# Patient Record
Sex: Female | Born: 1945 | Race: White | Hispanic: No | Smoking: Never smoker
Health system: Southern US, Community
[De-identification: ages and names within clinical notes are randomized; demographics above are authoritative.]

## PROBLEM LIST (undated history)

## (undated) ENCOUNTER — Inpatient Hospital Stay: Admission: EM | Payer: Self-pay | Source: Home / Self Care

## (undated) DIAGNOSIS — E785 Hyperlipidemia, unspecified: Secondary | ICD-10-CM

## (undated) DIAGNOSIS — I2510 Atherosclerotic heart disease of native coronary artery without angina pectoris: Secondary | ICD-10-CM

## (undated) DIAGNOSIS — F419 Anxiety disorder, unspecified: Secondary | ICD-10-CM

## (undated) DIAGNOSIS — M109 Gout, unspecified: Secondary | ICD-10-CM

## (undated) DIAGNOSIS — K648 Other hemorrhoids: Secondary | ICD-10-CM

## (undated) DIAGNOSIS — I219 Acute myocardial infarction, unspecified: Secondary | ICD-10-CM

## (undated) DIAGNOSIS — E039 Hypothyroidism, unspecified: Secondary | ICD-10-CM

## (undated) DIAGNOSIS — E119 Type 2 diabetes mellitus without complications: Secondary | ICD-10-CM

## (undated) DIAGNOSIS — H409 Unspecified glaucoma: Secondary | ICD-10-CM

## (undated) DIAGNOSIS — I480 Paroxysmal atrial fibrillation: Secondary | ICD-10-CM

## (undated) DIAGNOSIS — K635 Polyp of colon: Secondary | ICD-10-CM

## (undated) DIAGNOSIS — B369 Superficial mycosis, unspecified: Secondary | ICD-10-CM

## (undated) DIAGNOSIS — E079 Disorder of thyroid, unspecified: Secondary | ICD-10-CM

## (undated) DIAGNOSIS — H6241 Otitis externa in other diseases classified elsewhere, right ear: Secondary | ICD-10-CM

## (undated) DIAGNOSIS — J45909 Unspecified asthma, uncomplicated: Secondary | ICD-10-CM

## (undated) DIAGNOSIS — M75102 Unspecified rotator cuff tear or rupture of left shoulder, not specified as traumatic: Secondary | ICD-10-CM

## (undated) DIAGNOSIS — J189 Pneumonia, unspecified organism: Secondary | ICD-10-CM

## (undated) DIAGNOSIS — T7840XA Allergy, unspecified, initial encounter: Secondary | ICD-10-CM

## (undated) DIAGNOSIS — M1990 Unspecified osteoarthritis, unspecified site: Secondary | ICD-10-CM

## (undated) DIAGNOSIS — I10 Essential (primary) hypertension: Secondary | ICD-10-CM

## (undated) HISTORY — PX: ANGIOPLASTY: SHX39

## (undated) HISTORY — DX: Hypothyroidism, unspecified: E03.9

## (undated) HISTORY — DX: Allergy, unspecified, initial encounter: T78.40XA

## (undated) HISTORY — DX: Other hemorrhoids: K64.8

## (undated) HISTORY — DX: Paroxysmal atrial fibrillation: I48.0

## (undated) HISTORY — DX: Unspecified asthma, uncomplicated: J45.909

## (undated) HISTORY — DX: Disorder of thyroid, unspecified: E07.9

## (undated) HISTORY — PX: CARDIAC CATHETERIZATION: SHX172

## (undated) HISTORY — DX: Otitis externa in other diseases classified elsewhere, right ear: H62.41

## (undated) HISTORY — DX: Unspecified osteoarthritis, unspecified site: M19.90

## (undated) HISTORY — DX: Acute myocardial infarction, unspecified: I21.9

## (undated) HISTORY — DX: Superficial mycosis, unspecified: B36.9

## (undated) HISTORY — DX: Unspecified rotator cuff tear or rupture of left shoulder, not specified as traumatic: M75.102

## (undated) HISTORY — DX: Polyp of colon: K63.5

## (undated) HISTORY — DX: Unspecified glaucoma: H40.9

---

## 2008-12-03 HISTORY — PX: CATARACT EXTRACTION: SUR2

## 2013-12-03 HISTORY — PX: HEMORRHOID BANDING: SHX5850

## 2015-01-04 HISTORY — PX: COLONOSCOPY W/ POLYPECTOMY: SHX1380

## 2015-01-04 LAB — HM COLONOSCOPY

## 2016-07-30 DIAGNOSIS — I219 Acute myocardial infarction, unspecified: Secondary | ICD-10-CM

## 2016-07-30 HISTORY — DX: Acute myocardial infarction, unspecified: I21.9

## 2016-10-10 ENCOUNTER — Emergency Department
Admission: EM | Admit: 2016-10-10 | Discharge: 2016-10-10 | Disposition: A | Discharge status: Home / Self Care | Payer: PRIVATE HEALTH INSURANCE | Source: Home / Self Care | Attending: Family Medicine | Admitting: Family Medicine

## 2016-10-10 ENCOUNTER — Encounter: Payer: Self-pay | Admitting: *Deleted

## 2016-10-10 DIAGNOSIS — M109 Gout, unspecified: Secondary | ICD-10-CM | POA: Diagnosis not present

## 2016-10-10 HISTORY — DX: Essential (primary) hypertension: I10

## 2016-10-10 HISTORY — DX: Hyperlipidemia, unspecified: E78.5

## 2016-10-10 HISTORY — DX: Type 2 diabetes mellitus without complications: E11.9

## 2016-10-10 HISTORY — DX: Gout, unspecified: M10.9

## 2016-10-10 MED ORDER — COLCHICINE 0.6 MG PO TABS: 0.6000 mg | ORAL_TABLET | Freq: Every day | ORAL | 0 refills | Status: DC

## 2016-10-10 NOTE — ED Triage Notes (Signed)
Pt c/o LT ankle pain and swelling x 2 days. She reports some pain in her great toe. Denies injury. Reports a hx of gout.

## 2016-10-10 NOTE — ED Provider Notes (Signed)
CSN: [id number]     Arrival date & time 10/10/16  1520 History   First MD Initiated Contact with Patient 10/10/16 1558     Chief Complaint  Patient presents with  . Ankle Pain   (Consider location/radiation/quality/duration/timing/severity/associated sxs/prior Treatment) HPI Tara Campbell is a 70 y.o. female presenting to UC with c/o Left lateral ankle pain that started 2 days ago with mild swelling and redness.  She initially thought she had accidentally stepped wrong but then developed great toe pain this night, c/w prior episodes of gout.  She has not had gout in her ankle before but tends to get a gout flare in great toes every 8-9 months. It has been almost 1 year since last flare. She does not recall what she has taken in the past for gout but notes it works within 2-3 days.  She is on Plavix.  She also has DM but has been on prednisone for asthma flares. She does not believe she was on prednisone for the gout. Denies fever or chills. No known injuries to Left ankle or foot.    Past Medical History:  Diagnosis Date  . Diabetes mellitus without complication (HCC)   . Gout   . Hyperlipidemia   . Hypertension    Past Surgical History:  Procedure Laterality Date  . ANGIOPLASTY     Family History  Problem Relation Age of Onset  . Hypertension Mother   . Glaucoma Father   . Heart disease Father   . Hypertension Father    Social History  Substance Use Topics  . Smoking status: Never Smoker  . Smokeless tobacco: Never Used  . Alcohol use Yes   OB History    No data available     Review of Systems  Constitutional: Negative for chills and fever.  Musculoskeletal: Positive for arthralgias, joint swelling and myalgias. Negative for gait problem.       Left ankle  Skin: Positive for color change. Negative for rash and wound.  Neurological: Negative for weakness and numbness.    Allergies  Latex and Sulfa antibiotics  Home Medications   Prior to Admission medications    Medication Sig Start Date End Date Taking? Authorizing Provider  atenolol (TENORMIN) 100 MG tablet Take 100 mg by mouth daily.   Yes Historical Provider, MD  atorvastatin (LIPITOR) 10 MG tablet Take 10 mg by mouth daily.   Yes Historical Provider, MD  clopidogrel (PLAVIX) 75 MG tablet Take 75 mg by mouth daily.   Yes Historical Provider, MD  Fluticasone Furoate-Vilanterol (BREO ELLIPTA IN) Inhale into the lungs.   Yes Historical Provider, MD  irbesartan (AVAPRO) 300 MG tablet Take 300 mg by mouth daily.   Yes Historical Provider, MD  levothyroxine (SYNTHROID, LEVOTHROID) 100 MCG tablet Take 100 mcg by mouth daily before breakfast.   Yes Historical Provider, MD  metFORMIN (GLUCOPHAGE) 500 MG tablet Take by mouth 2 (two) times daily with a meal.   Yes Historical Provider, MD  sitaGLIPtin (JANUVIA) 100 MG tablet Take 100 mg by mouth daily.   Yes Historical Provider, MD  tiotropium (SPIRIVA) 18 MCG inhalation capsule Place 18 mcg into inhaler and inhale daily.   Yes Historical Provider, MD  colchicine 0.6 MG tablet Take 1 tablet (0.6 mg total) by mouth daily. 10/10/16   Junius FinnerErin O'Malley, PA-C   Meds Ordered and Administered this Visit  Medications - No data to display  BP 153/82 (BP Location: Left Arm)   Pulse 116   Temp 98.1 F (  36.7 C) (Oral)   Resp 18   Ht 5' 3.5" (1.613 m)   Wt 202 lb (91.6 kg)   SpO2 96%   BMI 35.22 kg/m  No data found.   Physical Exam  Constitutional: She is oriented to person, place, and time. She appears well-developed and well-nourished.  HENT:  Head: Normocephalic and atraumatic.  Eyes: EOM are normal.  Neck: Normal range of motion.  Cardiovascular: Normal rate.   Pulses:      Dorsalis pedis pulses are 2+ on the left side.       Posterior tibial pulses are 2+ on the left side.  Pulmonary/Chest: Effort normal.  Musculoskeletal: Normal range of motion. She exhibits edema and tenderness.  Left ankle: mild edema to lateral aspect, tender. Full ROM.  Full ROM  toes, no tenderness or edema of great toe. Calf is soft, non-tender.  Neurological: She is alert and oriented to person, place, and time.  Skin: Skin is warm and dry. Capillary refill takes less than 2 seconds. There is erythema.  Left lateral ankle: skin in tact, mild erythema and warmth. Tender. No red streaking.  Psychiatric: She has a normal mood and affect. Her behavior is normal.  Nursing note and vitals reviewed.   Urgent Care Course   Clinical Course     Procedures (including critical care time)  Labs Review Labs Reviewed - No data to display  Imaging Review No results found.   MDM   1. Acute gout of left ankle, unspecified cause    Pt c/o Left lateral ankle pain c/w prior episodes of gout. No known injury. Left ankle: mild edema with erythema and warmth. Calf is soft, non-tender.  Will treat as gout per pt's reported hx. Pt does not recall medication she was on last for gout but based on hx, it was most likely colchicine.  Rx: colchicine 0.6mg  once daily for up to 6 days. Encouraged f/u with PCP in 4-5 days if not improving, sooner if worsening.     Junius Finner, PA-C 10/10/16 [phone]

## 2017-01-06 DIAGNOSIS — J4551 Severe persistent asthma with (acute) exacerbation: Secondary | ICD-10-CM | POA: Insufficient documentation

## 2017-01-11 ENCOUNTER — Ambulatory Visit: Payer: PRIVATE HEALTH INSURANCE | Admitting: Physician Assistant

## 2017-01-14 ENCOUNTER — Ambulatory Visit (INDEPENDENT_AMBULATORY_CARE_PROVIDER_SITE_OTHER): Payer: PRIVATE HEALTH INSURANCE | Admitting: Physician Assistant

## 2017-01-14 ENCOUNTER — Encounter: Payer: Self-pay | Admitting: Physician Assistant

## 2017-01-14 ENCOUNTER — Ambulatory Visit (INDEPENDENT_AMBULATORY_CARE_PROVIDER_SITE_OTHER): Payer: PRIVATE HEALTH INSURANCE

## 2017-01-14 VITALS — BP 113/66 | HR 71 | Temp 98.0°F | Wt 193.0 lb

## 2017-01-14 DIAGNOSIS — R05 Cough: Secondary | ICD-10-CM

## 2017-01-14 DIAGNOSIS — J189 Pneumonia, unspecified organism: Secondary | ICD-10-CM

## 2017-01-14 MED ORDER — PREDNISONE 10 MG (48) PO TBPK: ORAL_TABLET | Freq: Every day | ORAL | 0 refills | Status: DC

## 2017-01-14 MED ORDER — MOXIFLOXACIN HCL 400 MG PO TABS
400.0000 mg | ORAL_TABLET | Freq: Every day | ORAL | 0 refills | Status: DC
Start: 2017-01-14 — End: 2017-01-23

## 2017-01-14 NOTE — Patient Instructions (Addendum)
Follow-up in 1 week  Healthcare-Associated Pneumonia Healthcare-associated pneumonia is a lung infection that a person can get when in a health care setting or during certain procedures. The infection causes air sacs inside the lungs to fill with pus or fluid. Healthcare-associated pneumonia is usually caused by bacteria that are common in health care settings. These bacteria may be resistant to some antibiotic medicines. What are the causes? This condition is caused by bacteria that get into your lungs. You can get this condition if you:  Breathe in droplets from an infected person's cough or sneeze.  Touch something that an infected person coughed or sneezed on and then touch your mouth, nose, or eyes.  Have a bacterial infection somewhere else in your body, if the bacteria spread to your lungs through your blood. What increases the risk? This condition is more likely to develop in people who:  Have a disease that weakens their body's defense system (immune system) or their ability to cough out germs.  Are older than age 73.  Having trouble swallowing.  Use a feeding or breathing tube.  Have a cold or the flu.  Have an IV tube inserted in a vein.  Have surgery.  Have a bed sore.  Live in a long-term care facility, such as a nursing home.  Were in the hospital for two or more days in the past 3 months.  Received hemodialysis in the past 30 days. What are the signs or symptoms? Symptoms of this condition include:  Fever.  Chills.  Cough.  Shortness of breath.  Wheezing or crackling sounds when breathing. How is this diagnosed? This condition may be diagnosed based on:  Your symptoms.  A chest X-ray.  A measurement of the amount of oxygen in your blood. How is this treated? This condition is treated with antibiotics. Your health care provider may take a sample of cells (culture) from your throat to determine what type of bacteria is in your lungs and change  your antibiotic based on the results. If you have bacteria in your blood, trouble breathing, or a low oxygen level, you may need to be treated at the hospital. At the hospital, you will be given antibiotics through an IV tube. You may also be given oxygen or breathing treatments. Follow these instructions at home: Medicine  Take your antibiotic medicine as told by your health care provider. Do not stop taking the antibiotic even if you start to feel better.  Take over-the-counter and other prescription medicines only as told by your health care provider. Activity  Rest at home until you feel better.  Return to your normal activities as told by your health care provider. Ask your health care provider what activities are safe for you. General instructions  Drink enough fluid to keep your urine clear or pale yellow.  Do not use any products that contain nicotine or tobacco, such as cigarettes and e-cigarettes. If you need help quitting, ask your health care provider.  Limit alcohol intake to no more than 1 drink per day for nonpregnant women and 2 drinks per day for men. One drink equals 12 oz of beer, 5 oz of wine, or 1 oz of hard liquor.  Keep all follow-up visits as told by your health care provider. This is important. How is this prevented? Actions that I can take To lower your risk of getting this condition again:  Do not smoke. This includes e-cigarettes.  Do not drink too much alcohol.  Keep your immune system  healthy by eating well and getting enough sleep.  Get a flu shot every year (annually).  Get a pneumonia vaccination if:  You are older than age 73.  You smoke.  You have a long-lasting condition like lung disease.  Exercise your lungs by taking deep breaths, walking, and using an incentive spirometer as directed.  Wash your hands often with soap and water. If you cannot get to a sink to wash your hands, use an alcohol-based hand cleaner.  Make sure your health  care providers are washing their hands. If you do not see them wash their hands, ask them to do so.  When you are in a health care facility, avoid touching your eyes, nose, and mouth.  Avoid touching any surface near where people have coughed or sneezed.  Stand away from sick people when they are coughing or sneezing.  Wear a mask if you cannot avoid exposure to people who are sick.  Clean all surfaces often with a disinfectant cleaner, especially if someone is sick at home or work. Precautions of my health care team Hospitals, nursing homes, and other health care facilities take special care to try to prevent healthcare-associated pneumonia. To do this, your health care team may:  Clean their hands with soap and water or with alcohol-based hand sanitizer before and after seeing patients.  Wear gloves or masks during treatment.  Sanitize medical instruments, tubes, other equipment, and surfaces in patient rooms.  Raise (elevate) the head of your hospital bed so you are not lying flat. The head of the bed may be elevated 30 degrees or more.  Have you sit up and move around as soon as possible after surgery.  Only insert a breathing tube if needed.  Do these things for you if you have a breathing tube:  Clean the inside of your mouth regularly.  Remove the breathing tube as soon as it is no longer needed. Contact a health care provider if:  Your symptoms do not get better or they get worse.  Your symptoms come back after you have finished taking your antibiotics. Get help right away if:  You have trouble breathing.  You have confusion or difficulty thinking. This information is not intended to replace advice given to you by your health care provider. Make sure you discuss any questions you have with your health care provider. Document Released: 04/10/2016 Document Revised: 09/04/2016 Document Reviewed: 08/17/2016 Elsevier Interactive Patient Education  2017 [employer].

## 2017-01-14 NOTE — Progress Notes (Signed)
HPI:                                                                Tara Campbell is a 71 y.o. female who presents to [name]: Primary Care Sports Medicine today to establish care and hospital follow-up  Asthma: patient was admitted to [facility] ICU for acute asthma exacerbation 2/2 to influenza on 01/06/17. She also had new onset atrial fibrillation at that time. Treated with Tamiflu and discharged on home Breo and Spiriva. She continues to feel malaise and cough is more productive. Denies fevers, chills, myalgias. Denies dyspnea. Endorses 1 episode of diarrhea today.  HTN/Hx of MI: controlled on Atenolol. She underwent PCI in 07/2016 and had 2 stents. She is currently on daily Plavix. No outside pressures to report. Denies headache, chest pain, dyspnea, lightheadedness, and edema.   Health Maintenance Health Maintenance  Topic Date Due  . HEMOGLOBIN A1C  1946/09/03  . Hepatitis C Screening  [birth year]  . OPHTHALMOLOGY EXAM  04/18/1954  . TETANUS/TDAP  04/19/1963  . ZOSTAVAX  04/18/2004  . DEXA SCAN  04/18/2009  . PNA vac Low Risk Adult (1 of 2 - PCV13) 04/18/2009  . MAMMOGRAM  11/02/2017  . FOOT EXAM  01/14/2018  . COLONOSCOPY  01/31/2025  . INFLUENZA VACCINE  Completed    GYN/Sexual Health  Menstrual status: postmenopausal  Last pap smear: 2015  History of abnormal pap smears: no  Sexually active: no  Current contraception: n/a  Health Habits  Diet: fair, 5-6 cups of tea per day  Exercise: no  ETOH: occasional  Tobacco: no, never  Drugs: no   Past Medical History:  Diagnosis Date  . A-fib (HCC)   . Allergy   . Asthma   . Diabetes mellitus without complication (HCC)   . Gout   . Heart attack 07/30/2016   PCI, 2 stents  . Hyperlipidemia   . Hypertension   . Left rotator cuff tear   . Thyroid disease    Past Surgical History:  Procedure Laterality Date  . ANGIOPLASTY     Social History  Substance Use Topics  . Smoking status:  Never Smoker  . Smokeless tobacco: Never Used  . Alcohol use Yes   family history includes Diabetes in her father and paternal grandmother; Glaucoma in her father; Heart disease in her father; Hyperlipidemia in her father and mother; Hypertension in her father and mother; Skin cancer in her father.  ROS: negative except as noted in the HPI  Medications: Current Outpatient Prescriptions  Medication Sig Dispense Refill  . atenolol (TENORMIN) 100 MG tablet Take 100 mg by mouth daily.    Marland Kitchen atorvastatin (LIPITOR) 10 MG tablet Take 10 mg by mouth daily.    . clopidogrel (PLAVIX) 75 MG tablet Take 75 mg by mouth daily.    . colchicine 0.6 MG tablet Take 1 tablet (0.6 mg total) by mouth daily. 6 tablet 0  . Fluticasone Furoate-Vilanterol (BREO ELLIPTA IN) Inhale into the lungs.    . irbesartan (AVAPRO) 300 MG tablet Take 300 mg by mouth daily.    Marland Kitchen levothyroxine (SYNTHROID, LEVOTHROID) 100 MCG tablet Take 100 mcg by mouth daily before breakfast.    . metFORMIN (GLUCOPHAGE) 500 MG tablet Take by mouth 2 (two) times  daily with a meal.    . sitaGLIPtin (JANUVIA) 100 MG tablet Take 100 mg by mouth daily.    Marland Kitchen tiotropium (SPIRIVA) 18 MCG inhalation capsule Place 18 mcg into inhaler and inhale daily.    Marland Kitchen moxifloxacin (AVELOX) 400 MG tablet Take 1 tablet (400 mg total) by mouth daily. 5 tablet 0  . predniSONE (STERAPRED UNI-PAK 48 TAB) 10 MG (48) TBPK tablet Take by mouth daily. Use as directed for taper 1 tablet 0   No current facility-administered medications for this visit.    Allergies  Allergen Reactions  . Sulfamethoxazole Rash    HIVES  . Latex   . Sulfa Antibiotics     Objective:  BP 113/66   Pulse 71   Temp 98 F (36.7 C) (Oral)   Wt 193 lb (87.5 kg)   SpO2 97%   BMI 33.65 kg/m  Gen: well-groomed, cooperative, not ill-appearing, no distress HEENT: normal conjunctiva, TM's clear, oropharynx clear, moist mucus membranes Pulm: Normal work of breathing, voice is hoarse, clear to  auscultation bilaterally, occasional dry cough CV: Normal rate, regular rhythm, s1 and s2 distinct, no murmurs, clicks or rubs appreciated on this exam, no carotid bruit Neuro: alert and oriented x 3, EOM's intact, PERRLA, DTR's intact MSK: strength 5/5 and symmetric, normal gait, distal pulses intact, no peripheral edema Skin: warm and dry, no rashes or lesions on exposed skin Psych: normal affect, pleasant mood, normal speech and thought content   I personally reviewed H&P/discharge summary and labs from 01/05/17  Assessment and Plan: 71 y.o. female with   HCAP (healthcare-associated pneumonia) - afebrile, saturating 97% on room air, but given recent ICU hospitalization will cover for HCAP with Avelox - Prednisone taper - DG Chest 2 View pending - close follow-up in 1 week - predniSONE (STERAPRED UNI-PAK 48 TAB) 10 MG (48) TBPK tablet; Take by mouth daily. Use as directed for taper  Dispense: 1 tablet; Refill: 0 - moxifloxacin (AVELOX) 400 MG tablet; Take 1 tablet (400 mg total) by mouth daily.  Dispense: 5 tablet; Refill: 0  Patient education and anticipatory guidance given Patient agrees with treatment plan Follow-up in 1 week or sooner as needed  Levonne Hubert PA-C

## 2017-01-16 ENCOUNTER — Telehealth: Payer: Self-pay | Admitting: Physician Assistant

## 2017-01-16 DIAGNOSIS — I480 Paroxysmal atrial fibrillation: Secondary | ICD-10-CM | POA: Insufficient documentation

## 2017-01-16 DIAGNOSIS — J4550 Severe persistent asthma, uncomplicated: Secondary | ICD-10-CM | POA: Insufficient documentation

## 2017-01-16 DIAGNOSIS — J4541 Moderate persistent asthma with (acute) exacerbation: Secondary | ICD-10-CM

## 2017-01-16 DIAGNOSIS — I517 Cardiomegaly: Secondary | ICD-10-CM | POA: Insufficient documentation

## 2017-01-16 DIAGNOSIS — J4540 Moderate persistent asthma, uncomplicated: Secondary | ICD-10-CM | POA: Insufficient documentation

## 2017-01-16 NOTE — Telephone Encounter (Signed)
Patient with recent hospitalization for acute asthma exacerbation 2/2 influenza and new-onset paroxysmal A-fib found to have enlarged cardiac silhouette on chest x-ray. Ordering follow-up echocardiogram

## 2017-01-16 NOTE — Telephone Encounter (Signed)
-----   Message from Monica Becton, MD sent at 01/15/2017  5:56 PM EST ----- This probably cardiomyopathy of some type, it's reasonable to go ahead and get an echocardiogram.

## 2017-01-17 ENCOUNTER — Telehealth: Payer: Self-pay | Admitting: Physician Assistant

## 2017-01-17 NOTE — Telephone Encounter (Signed)
FYI- upon review of Pt's insurance benefit information, it was determined her Tara Campbell is for [place] only. They will only cover inpatient or emergency visits. Called and spoke with Pt, she will get an echo done when she goes back to [place]. She will fax the results to PCP here. Cancelling current echo order.

## 2017-01-21 ENCOUNTER — Ambulatory Visit: Payer: PRIVATE HEALTH INSURANCE | Admitting: Physician Assistant

## 2017-01-23 ENCOUNTER — Ambulatory Visit (INDEPENDENT_AMBULATORY_CARE_PROVIDER_SITE_OTHER): Payer: PRIVATE HEALTH INSURANCE | Admitting: Physician Assistant

## 2017-01-23 VITALS — BP 108/58 | HR 81 | Wt 195.0 lb

## 2017-01-23 DIAGNOSIS — E782 Mixed hyperlipidemia: Secondary | ICD-10-CM

## 2017-01-23 DIAGNOSIS — I480 Paroxysmal atrial fibrillation: Secondary | ICD-10-CM | POA: Diagnosis not present

## 2017-01-23 DIAGNOSIS — E119 Type 2 diabetes mellitus without complications: Secondary | ICD-10-CM

## 2017-01-23 DIAGNOSIS — E1169 Type 2 diabetes mellitus with other specified complication: Secondary | ICD-10-CM | POA: Insufficient documentation

## 2017-01-23 DIAGNOSIS — E039 Hypothyroidism, unspecified: Secondary | ICD-10-CM | POA: Insufficient documentation

## 2017-01-23 DIAGNOSIS — I252 Old myocardial infarction: Secondary | ICD-10-CM | POA: Diagnosis not present

## 2017-01-23 MED ORDER — APIXABAN 5 MG PO TABS: 5.0000 mg | ORAL_TABLET | Freq: Two times a day (BID) | ORAL | 2 refills | Status: DC

## 2017-01-23 NOTE — Progress Notes (Signed)
HPI:                                                                Tara Campbell is a 71 y.o. female who presents to [name]: Primary Care Sports Medicine today for follow-up HCAP  Patient is currently in the process of relocating from [place] to [place].  Patient was admitted to [facility] ICU for acute asthma exacerbation 2/2 to influenza on 01/06/17. I saw her on 01/14/17 and she was continuing to experience malaise and productive cough. She completed HCAP treatment with Avelox and is currently on Prednisone taper. Patient reports she is feeling much improved. Denies fever, chills, malaise. Denies cough, dyspnea, or hemoptysis.  Patient had new onset paroxysmal atrial fibrillation on admission approximately 3 weeks ago. She was not discharged on any anticoagulation. She denies chest pain, palpitations, orthopnea, PND, or peripheral edema. She is currently on atenolol and plavix for recent AMI s/p PCI and stent x 2 in 07/2016. She reports she has been followed by a cardiologist and that she had a negative nuclear medicine stress test.   DM II - last A1C 6.7 per patient 11/2016. She reports she goes to the eye doctor every 6 months. Denies polyuria, vision problems, lightheadness, and paresthesias. Denies hypoglycemic events. Denies open wounds/sores.   Past Medical History:  Diagnosis Date  . A-fib (HCC)   . Allergy   . Asthma   . Diabetes mellitus without complication (HCC)   . Gout   . Heart attack 07/30/2016   PCI, 2 stents  . Hyperlipidemia   . Hypertension   . Left rotator cuff tear   . Thyroid disease    Past Surgical History:  Procedure Laterality Date  . ANGIOPLASTY     Social History  Substance Use Topics  . Smoking status: Never Smoker  . Smokeless tobacco: Never Used  . Alcohol use Yes   family history includes Diabetes in her father and paternal grandmother; Glaucoma in her father; Heart disease in her father; Hyperlipidemia in her father  and mother; Hypertension in her father and mother; Skin cancer in her father.  ROS: negative except as noted in the HPI  Medications: Current Outpatient Prescriptions  Medication Sig Dispense Refill  . atenolol (TENORMIN) 100 MG tablet Take 100 mg by mouth daily.    Marland Kitchen atorvastatin (LIPITOR) 10 MG tablet Take 10 mg by mouth daily.    . clopidogrel (PLAVIX) 75 MG tablet Take 75 mg by mouth daily.    . colchicine 0.6 MG tablet Take 1 tablet (0.6 mg total) by mouth daily. 6 tablet 0  . Fluticasone Furoate-Vilanterol (BREO ELLIPTA IN) Inhale into the lungs.    . irbesartan (AVAPRO) 300 MG tablet Take 300 mg by mouth daily.    Marland Kitchen levothyroxine (SYNTHROID, LEVOTHROID) 100 MCG tablet Take 100 mcg by mouth daily before breakfast.    . metFORMIN (GLUCOPHAGE) 500 MG tablet Take by mouth 2 (two) times daily with a meal.    . moxifloxacin (AVELOX) 400 MG tablet Take 1 tablet (400 mg total) by mouth daily. 5 tablet 0  . predniSONE (STERAPRED UNI-PAK 48 TAB) 10 MG (48) TBPK tablet Take by mouth daily. Use as directed for taper 1 tablet 0  . sitaGLIPtin (JANUVIA) 100 MG tablet  Take 100 mg by mouth daily.    Marland Kitchen tiotropium (SPIRIVA) 18 MCG inhalation capsule Place 18 mcg into inhaler and inhale daily.     No current facility-administered medications for this visit.    Allergies  Allergen Reactions  . Sulfamethoxazole Rash    HIVES  . Latex   . Sulfa Antibiotics        Objective:  BP (!) 108/58   Pulse 81   Wt 195 lb (88.5 kg)   BMI 34.00 kg/m  Gen: well-groomed, obese, cooperative, not ill-appearing, no distress Pulm: Normal work of breathing, normal phonation, clear to auscultation bilaterally, no wheezes, rales or rhonchi CV: Normal rate, irregular rhythm, s1 and s2 distinct, no murmurs, clicks or rubs, no peripheral edema Neuro: alert and oriented x 3, EOM's intact Skin: warm and dry, no rashes or lesions on exposed skin, no cyanosis  RECENT LABS 01/05/17 BNP 76.0  TROPONINI <0.006    CMP: 01/05/17 01/06/17 NA 138 135  K 3.6 4.5  CL 104 103  CO2 20* 23  BUN 13 16  CREATININE 0.93 0.89  GLU 283* 320*  CALCIUM 9.5 9.7  MG 1.5* 2.2  PHOS 2.8 3.1   CBC: 01/05/17 01/06/17 WBC 8.8 8.8  HGB 12.8 11.1*  HCT 38.9 32.8*  PLT 260 312   DIFF: 01/05/17 NEUTOPHILPCT 67  LYMPHOPCT 16  MONOPCT 11  BASOPCT 1  EOSPCT 6  NEUTROABS 5.9  LYMPHSABS 1.4  MONOSABS 0.9  BASOSABS 0.1  EOSABS 0.5   ECG performed today 01/23/2017 Ventricular rate 77 PR-I 172 QRS 78 Qtc 414 NSR with PAC's, low voltage QRS, prior septal infarct  Assessment and Plan: 71 y.o. female with  Paroxysmal Atrial fibrillation - new diagnosis. CHADS2 score 4, anticoagulation recommended. Currently on Plavix s/p AMI and stenting.  - ECHO ordered, but due to insurance patient is postponing and will obtain one in [place] next month - ECG today showing NSR and PACs - Referral to Cardiology - Dr. Olga Millers: I've sent a message through MyChart to inquire about starting patient on anticoagulation. He recommended she cont Plavix and start Apixaban 5mg  BID - I reviewed patient's labs from recent hospitalization. Kidney function was normal. Hemoglobin/Hct normal.  Preventive Care - she is due for Mammogram, DEXA, TdaP. She would like to defer until she gets [place] health insurance in a couple of months  Patient education and anticipatory guidance given Patient agrees with treatment plan Follow-up in 3 months or sooner as needed  Levonne Hubert PA-C

## 2017-01-23 NOTE — Patient Instructions (Addendum)
I have sent a message to our cardiologist downstairs about anticoagulation for your Afib. I will give you a call with his recommendation.  Get your echocardiogram  Once you have your insurance, return for your lab work, Tetanus, bone density and diabetes follow-up :)   Preventive Care 73 Years and Older, Female Preventive care refers to lifestyle choices and visits with your health care provider that can promote health and wellness. What does preventive care include?  A yearly physical exam. This is also called an annual well check.  Dental exams once or twice a year.  Routine eye exams. Ask your health care provider how often you should have your eyes checked.  Personal lifestyle choices, including:  Daily care of your teeth and gums.  Regular physical activity.  Eating a healthy diet.  Avoiding tobacco and drug use.  Limiting alcohol use.  Practicing safe sex.  Taking low-dose aspirin every day.  Taking vitamin and mineral supplements as recommended by your health care provider. What happens during an annual well check? The services and screenings done by your health care provider during your annual well check will depend on your age, overall health, lifestyle risk factors, and family history of disease. Counseling  Your health care provider may ask you questions about your:  Alcohol use.  Tobacco use.  Drug use.  Emotional well-being.  Home and relationship well-being.  Sexual activity.  Eating habits.  History of falls.  Memory and ability to understand (cognition).  Work and work Statistician.  Reproductive health. Screening  You may have the following tests or measurements:  Height, weight, and BMI.  Blood pressure.  Lipid and cholesterol levels. These may be checked every 5 years, or more frequently if you are over 24 years old.  Skin check.  Lung cancer screening. You may have this screening every year starting at age 36 if you have a  30-pack-year history of smoking and currently smoke or have quit within the past 15 years.  Fecal occult blood test (FOBT) of the stool. You may have this test every year starting at age 44.  Flexible sigmoidoscopy or colonoscopy. You may have a sigmoidoscopy every 5 years or a colonoscopy every 10 years starting at age 18.  Hepatitis C blood test.  Hepatitis B blood test.  Sexually transmitted disease (STD) testing.  Diabetes screening. This is done by checking your blood sugar (glucose) after you have not eaten for a while (fasting). You may have this done every 1-3 years.  Bone density scan. This is done to screen for osteoporosis. You may have this done starting at age 66.  Mammogram. This may be done every 1-2 years. Talk to your health care provider about how often you should have regular mammograms. Talk with your health care provider about your test results, treatment options, and if necessary, the need for more tests. Vaccines  Your health care provider may recommend certain vaccines, such as:  Influenza vaccine. This is recommended every year.  Tetanus, diphtheria, and acellular pertussis (Tdap, Td) vaccine. You may need a Td booster every 10 years.  Varicella vaccine. You may need this if you have not been vaccinated.  Zoster vaccine. You may need this after age 26.  Measles, mumps, and rubella (MMR) vaccine. You may need at least one dose of MMR if you were born in 1957 or later. You may also need a second dose.  Pneumococcal 13-valent conjugate (PCV13) vaccine. One dose is recommended after age 41.  Pneumococcal polysaccharide (PPSV23)  vaccine. One dose is recommended after age 15.  Meningococcal vaccine. You may need this if you have certain conditions.  Hepatitis A vaccine. You may need this if you have certain conditions or if you travel or work in places where you may be exposed to hepatitis A.  Hepatitis B vaccine. You may need this if you have certain  conditions or if you travel or work in places where you may be exposed to hepatitis B.  Haemophilus influenzae type b (Hib) vaccine. You may need this if you have certain conditions. Talk to your health care provider about which screenings and vaccines you need and how often you need them. This information is not intended to replace advice given to you by your health care provider. Make sure you discuss any questions you have with your health care provider. Document Released: 12/16/2015 Document Revised: 08/08/2016 Document Reviewed: 09/20/2015 Elsevier Interactive Patient Education  2017 [employer].

## 2017-01-24 ENCOUNTER — Encounter: Payer: Self-pay | Admitting: Physician Assistant

## 2017-01-25 ENCOUNTER — Telehealth: Payer: Self-pay

## 2017-01-25 NOTE — Telephone Encounter (Signed)
-----   Message from [facility], [place] sent at 01/24/2017  8:46 AM EST ----- Tell Tara Campbell that Dr. Jens Som said to continue her plavix and start Apixaban twice a day for her Afib. Since this is an anticoagulant, it can cause bleeding. She needs to let us know if she has any falls, hits her head, or notices blood in her stool or has dark tarry stool. Also Dr. Jens Som says he looks forward to meeting her.

## 2017-01-25 NOTE — Telephone Encounter (Signed)
Pt.notified

## 2017-03-05 ENCOUNTER — Ambulatory Visit: Payer: PRIVATE HEALTH INSURANCE | Admitting: Physician Assistant

## 2017-04-08 ENCOUNTER — Ambulatory Visit (INDEPENDENT_AMBULATORY_CARE_PROVIDER_SITE_OTHER): Payer: Medicare Other | Admitting: Physician Assistant

## 2017-04-08 ENCOUNTER — Ambulatory Visit (INDEPENDENT_AMBULATORY_CARE_PROVIDER_SITE_OTHER): Payer: Medicare Other

## 2017-04-08 VITALS — BP 150/80 | HR 83

## 2017-04-08 DIAGNOSIS — M25472 Effusion, left ankle: Secondary | ICD-10-CM

## 2017-04-08 DIAGNOSIS — M25572 Pain in left ankle and joints of left foot: Secondary | ICD-10-CM

## 2017-04-08 DIAGNOSIS — M109 Gout, unspecified: Secondary | ICD-10-CM | POA: Insufficient documentation

## 2017-04-08 MED ORDER — COLCHICINE 0.6 MG PO TABS: 0.6000 mg | ORAL_TABLET | Freq: Every day | ORAL | 0 refills | Status: DC

## 2017-04-08 NOTE — Patient Instructions (Addendum)
Follow-up within 4 weeks for your preventive care including diabetes and to discuss any refills  Low-Purine Diet Purines are compounds that affect the level of uric acid in your body. A low-purine diet is a diet that is low in purines. Eating a low-purine diet can prevent the level of uric acid in your body from getting too high and causing gout or kidney stones or both. What do I need to know about this diet?  Choose low-purine foods. Examples of low-purine foods are listed in the next section.  Drink plenty of fluids, especially water. Fluids can help remove uric acid from your body. Try to drink 8-16 cups (1.9-3.8 L) a day.  Limit foods high in fat, especially saturated fat, as fat makes it harder for the body to get rid of uric acid. Foods high in saturated fat include pizza, cheese, ice cream, whole milk, fried foods, and gravies. Choose foods that are lower in fat and lean sources of protein. Use olive oil when cooking as it contains healthy fats that are not high in saturated fat.  Limit alcohol. Alcohol interferes with the elimination of uric acid from your body. If you are having a gout attack, avoid all alcohol.  Keep in mind that different people's bodies react differently to different foods. You will probably learn over time which foods do or do not affect you. If you discover that a food tends to cause your gout to flare up, avoid eating that food. You can more freely enjoy foods that do not cause problems. If you have any questions about a food item, talk to your dietitian or health care provider. Which foods are low, moderate, and high in purines? The following is a list of foods that are low, moderate, and high in purines. You can eat any amount of the foods that are low in purines. You may be able to have small amounts of foods that are moderate in purines. Ask your health care provider how much of a food moderate in purines you can have. Avoid foods high in purines. Grains    Foods low in purines: Enriched white bread, pasta, rice, cake, cornbread, popcorn.  Foods moderate in purines: Whole-grain breads and cereals, wheat germ, bran, oatmeal. Uncooked oatmeal. Dry wheat bran or wheat germ.  Foods high in purines: Pancakes, [place] toast, biscuits, muffins. Vegetables   Foods low in purines: All vegetables, except those that are moderate in purines.  Foods moderate in purines: Asparagus, cauliflower, spinach, mushrooms, green peas. Fruits   All fruits are low in purines. Meats and other Protein Foods   Foods low in purines: Eggs, nuts, peanut butter.  Foods moderate in purines: 80-90% lean beef, lamb, veal, pork, poultry, fish, eggs, peanut butter, nuts. Crab, lobster, oysters, and shrimp. Cooked dried beans, peas, and lentils.  Foods high in purines: Anchovies, sardines, herring, mussels, tuna, codfish, scallops, trout, and haddock. Tomasa Blase. Organ meats (such as liver or kidney). Tripe. Game meat. Goose. Sweetbreads. Dairy   All dairy foods are low in purines. Low-fat and fat-free dairy products are best because they are low in saturated fat. Beverages   Drinks low in purines: Water, carbonated beverages, tea, coffee, cocoa.  Drinks moderate in purines: Soft drinks and other drinks sweetened with high-fructose corn syrup. Juices. To find whether a food or drink is sweetened with high-fructose corn syrup, look at the ingredients list.  Drinks high in purines: Alcoholic beverages (such as beer). Condiments   Foods low in purines: Salt, herbs, olives, pickles,  relishes, vinegar.  Foods moderate in purines: Butter, margarine, oils, mayonnaise. Fats and Oils   Foods low in purines: All types, except gravies and sauces made with meat.  Foods high in purines: Gravies and sauces made with meat. Other Foods   Foods low in purines: Sugars, sweets, gelatin. Cake. Soups made without meat.  Foods moderate in purines: Meat-based or fish-based soups, broths,  or bouillons. Foods and drinks sweetened with high-fructose corn syrup.  Foods high in purines: High-fat desserts (such as ice cream, cookies, cakes, pies, doughnuts, and chocolate). Contact your dietitian for more information on foods that are not listed here.  This information is not intended to replace advice given to you by your health care provider. Make sure you discuss any questions you have with your health care provider. Document Released: 03/16/2011 Document Revised: 04/26/2016 Document Reviewed: 10/26/2013 Elsevier Interactive Patient Education  2017 [employer].

## 2017-04-08 NOTE — Assessment & Plan Note (Signed)
Aspiration and injection of the subtalar joint as above, x-rays, crystal analysis. Return in one month, at that point we will consider getting uric acid levels.

## 2017-04-08 NOTE — Progress Notes (Signed)
   Subjective:    I'm seeing this patient as a consultation for:  [name], PA-C  CC: Left ankle swelling  HPI: This is a pleasant 71 year old female with a history of gout, she comes in with a several-day history of increasing swelling and pain over the left ankle and foot. She is not taking any medications for gout right now. Pain is severe, persistent.  Past medical history:  Negative.  See flowsheet/record as well for more information.  Surgical history: Negative.  See flowsheet/record as well for more information.  Family history: Negative.  See flowsheet/record as well for more information.  Social history: Negative.  See flowsheet/record as well for more information.  Allergies, and medications have been entered into the medical record, reviewed, and no changes needed.   Review of Systems: No headache, visual changes, nausea, vomiting, diarrhea, constipation, dizziness, abdominal pain, skin rash, fevers, chills, night sweats, weight loss, swollen lymph nodes, body aches, joint swelling, muscle aches, chest pain, shortness of breath, mood changes, visual or auditory hallucinations.   Objective:   General: Well Developed, well nourished, and in no acute distress.  Neuro/Psych: Alert and oriented x3, extra-ocular muscles intact, able to move all 4 extremities, sensation grossly intact. Skin: Warm and dry, no rashes noted.  Respiratory: Not using accessory muscles, speaking in full sentences, trachea midline.  Cardiovascular: Pulses palpable, no extremity edema. Abdomen: Does not appear distended. Left Ankle: Visibly swollen, fluid wave anterior. Range of motion is full in all directions. Strength is 5/5 in all directions. Stable lateral and medial ligaments; squeeze test and kleiger test unremarkable; Talar dome nontender; No pain at base of 5th MT; No tenderness over cuboid; No tenderness over N spot or navicular prominence No tenderness on posterior aspects of lateral  and medial malleolus No sign of peroneal tendon subluxations; Negative tarsal tunnel tinel's Able to walk 4 steps.  Procedure: Real-time Ultrasound Guided aspiration/Injection of left subtalar joint effusion Device: GE Logiq E  Verbal informed consent obtained.  Time-out conducted.  Noted no overlying erythema, induration, or other signs of local infection.  Skin prepped in a sterile fashion.  Local anesthesia: Topical Ethyl chloride.  With sterile technique and under real time ultrasound guidance:  Using a 22-gauge needle advanced into the subtalar joints, I drained about 1 mL of cloudy yellowish fluid in the syringe was switched, I then injected 1 mL kenalog 40, 1 mL lidocaine, 1 mL bupivicaine. Completed without difficulty  Pain immediately resolved suggesting accurate placement of the medication.  Advised to call if fevers/chills, erythema, induration, drainage, or persistent bleeding.  Images permanently stored and available for review in the ultrasound unit.  Impression: Technically successful ultrasound guided injection.  Impression and Recommendations:   This case required medical decision making of moderate complexity.  Left ankle swelling Aspiration and injection of the subtalar joint as above, x-rays, crystal analysis. Return in one month, at that point we will consider getting uric acid levels.

## 2017-04-08 NOTE — Progress Notes (Signed)
HPI:                                                                Tara Campbell is a 71 y.o. female who presents to [name] today for ankle/foot pain  Patient with PMH of gout, DM, CAD, HTN, and Afib presents with left ankle pain. She reports beginning on Friday night she developed medial ankle sensitivity while under the bed sheets. Since then, she developed swelling and pain that she attributes to a gout flair. She is unable to bear weight on her left foot today. She denies fever or chills.    Past Medical History:  Diagnosis Date  . Allergy   . Asthma   . Diabetes mellitus without complication (HCC)   . Gout   . Heart attack 07/30/2016   PCI, 2 stents  . Hyperlipidemia   . Hypertension   . Left rotator cuff tear   . Paroxysmal atrial fibrillation (HCC)   . Thyroid disease    Past Surgical History:  Procedure Laterality Date  . ANGIOPLASTY     Social History  Substance Use Topics  . Smoking status: Never Smoker  . Smokeless tobacco: Never Used  . Alcohol use Yes   family history includes Diabetes in her father and paternal grandmother; Glaucoma in her father; Heart disease in her father; Hyperlipidemia in her father and mother; Hypertension in her father and mother; Skin cancer in her father.  ROS: negative except as noted in the HPI  Medications: Current Outpatient Prescriptions  Medication Sig Dispense Refill  . apixaban (ELIQUIS) 5 MG TABS tablet Take 1 tablet (5 mg total) by mouth 2 (two) times daily. 60 tablet 2  . atenolol (TENORMIN) 100 MG tablet Take 100 mg by mouth daily.    Marland Kitchen atorvastatin (LIPITOR) 10 MG tablet Take 40 mg by mouth daily.    . clopidogrel (PLAVIX) 75 MG tablet Take 75 mg by mouth daily.    . colchicine 0.6 MG tablet Take 1 tablet (0.6 mg total) by mouth daily. 6 tablet 0  . Fluticasone Furoate-Vilanterol (BREO ELLIPTA IN) Inhale into the lungs.    . irbesartan (AVAPRO) 300 MG tablet  Take 300 mg by mouth daily.    Marland Kitchen levothyroxine (SYNTHROID, LEVOTHROID) 100 MCG tablet Take 100 mcg by mouth daily before breakfast.    . metFORMIN (GLUCOPHAGE) 500 MG tablet Take 1,000 mg by mouth 2 (two) times daily with a meal.    . predniSONE (STERAPRED UNI-PAK 48 TAB) 10 MG (48) TBPK tablet Take by mouth daily. Use as directed for taper 1 tablet 0  . sitaGLIPtin (JANUVIA) 100 MG tablet Take 100 mg by mouth daily.    Marland Kitchen tiotropium (SPIRIVA) 18 MCG inhalation capsule Place 18 mcg into inhaler and inhale daily.     No current facility-administered medications for this visit.    Allergies  Allergen Reactions  . Sulfamethoxazole Rash    HIVES  . Latex   . Sulfa Antibiotics        Objective:  BP (!) 150/80   Pulse 83  Gen: well-groomed, cooperative, not ill-appearing, no distress HEENT: normal conjunctiva, wearing glasses Pulm: Normal work of breathing, normal phonation CV: DP pulses intact Neuro: alert and oriented x 3,  sensation intact MSK: left ankle with visible swelling, there is a fluid collection just posterior to the lateral malleolus, tenderness on the medial and lateral aspects of the ankle, ROM intact, patient is limping, but able to ambulate, no erythema or warmth, no echymoses Skin: warm, dry, intact; no rashes or lesions on exposed skin   No results found for this or any previous visit (from the past 72 hour(s)). No results found.    Assessment and Plan: 71 y.o. female with   1. Left ankle swelling - consulted Dr. Benjamin Stain, Sports Medicine, for possible ultrasound-guided injection - Colchicine 0.6mg  daily as needed  Patient education and anticipatory guidance given Patient agrees with treatment plan Follow-up in 4 weeks for DM/HTN or sooner as needed if symptoms worsen or fail to improve  Levonne Hubert PA-C

## 2017-04-09 ENCOUNTER — Telehealth: Payer: Self-pay

## 2017-04-09 LAB — SYNOVIAL CELL COUNT + DIFF, W/ CRYSTALS
Basophils, %: 0 %
Eosinophils-Synovial: 0 % (ref 0–2)
Lymphocytes-Synovial Fld: 3 % (ref 0–74)
Monocyte/Macrophage: 11 % (ref 0–69)
Neutrophil, Synovial: 86 % — ABNORMAL HIGH (ref 0–24)
Synoviocytes, %: 0 % (ref 0–15)
WBC, Synovial: 153340 cells/uL — ABNORMAL HIGH (ref <150)

## 2017-04-09 NOTE — Telephone Encounter (Signed)
Pt given lab results, and she wanted to know if she only needs 14 days of colchicine?  Please advise.

## 2017-05-15 ENCOUNTER — Ambulatory Visit: Payer: PRIVATE HEALTH INSURANCE | Admitting: Sports Medicine

## 2017-05-15 ENCOUNTER — Encounter: Payer: PRIVATE HEALTH INSURANCE | Admitting: Physician Assistant

## 2017-05-29 ENCOUNTER — Ambulatory Visit (INDEPENDENT_AMBULATORY_CARE_PROVIDER_SITE_OTHER): Payer: Medicare Other | Admitting: Physician Assistant

## 2017-05-29 ENCOUNTER — Ambulatory Visit (INDEPENDENT_AMBULATORY_CARE_PROVIDER_SITE_OTHER): Payer: Medicare Other | Admitting: Sports Medicine

## 2017-05-29 ENCOUNTER — Encounter: Payer: Self-pay | Admitting: Physician Assistant

## 2017-05-29 ENCOUNTER — Telehealth: Payer: Self-pay | Admitting: Physician Assistant

## 2017-05-29 VITALS — BP 115/73 | HR 81 | Resp 18 | Ht 63.0 in | Wt 188.1 lb

## 2017-05-29 DIAGNOSIS — Z23 Encounter for immunization: Secondary | ICD-10-CM | POA: Diagnosis not present

## 2017-05-29 DIAGNOSIS — M7061 Trochanteric bursitis, right hip: Secondary | ICD-10-CM

## 2017-05-29 DIAGNOSIS — E785 Hyperlipidemia, unspecified: Secondary | ICD-10-CM

## 2017-05-29 DIAGNOSIS — Z0000 Encounter for general adult medical examination without abnormal findings: Secondary | ICD-10-CM

## 2017-05-29 DIAGNOSIS — Z1231 Encounter for screening mammogram for malignant neoplasm of breast: Secondary | ICD-10-CM

## 2017-05-29 DIAGNOSIS — I480 Paroxysmal atrial fibrillation: Secondary | ICD-10-CM

## 2017-05-29 DIAGNOSIS — J4540 Moderate persistent asthma, uncomplicated: Secondary | ICD-10-CM

## 2017-05-29 DIAGNOSIS — Z79899 Other long term (current) drug therapy: Secondary | ICD-10-CM | POA: Diagnosis not present

## 2017-05-29 DIAGNOSIS — M1A0790 Idiopathic chronic gout, unspecified ankle and foot, without tophus (tophi): Secondary | ICD-10-CM | POA: Diagnosis not present

## 2017-05-29 DIAGNOSIS — H538 Other visual disturbances: Secondary | ICD-10-CM

## 2017-05-29 DIAGNOSIS — E039 Hypothyroidism, unspecified: Secondary | ICD-10-CM

## 2017-05-29 DIAGNOSIS — H9193 Unspecified hearing loss, bilateral: Secondary | ICD-10-CM | POA: Insufficient documentation

## 2017-05-29 DIAGNOSIS — E1169 Type 2 diabetes mellitus with other specified complication: Secondary | ICD-10-CM

## 2017-05-29 DIAGNOSIS — E119 Type 2 diabetes mellitus without complications: Secondary | ICD-10-CM | POA: Diagnosis not present

## 2017-05-29 DIAGNOSIS — Z5181 Encounter for therapeutic drug level monitoring: Secondary | ICD-10-CM | POA: Insufficient documentation

## 2017-05-29 HISTORY — DX: Trochanteric bursitis, right hip: M70.61

## 2017-05-29 LAB — CBC
HCT: 38.6 % (ref 35.0–45.0)
HEMOGLOBIN: 12.8 g/dL (ref 11.7–15.5)
MCH: 28.5 pg (ref 27.0–33.0)
MCHC: 33.2 g/dL (ref 32.0–36.0)
MCV: 86 fL (ref 80.0–100.0)
MPV: 10 fL (ref 7.5–12.5)
Platelets: 396 10*3/uL (ref 140–400)
RBC: 4.49 MIL/uL (ref 3.80–5.10)
RDW: 13.9 % (ref 11.0–15.0)
WBC: 8.2 10*3/uL (ref 3.8–10.8)

## 2017-05-29 LAB — TSH: TSH: 4.57 m[IU]/L — AB

## 2017-05-29 MED ORDER — TIOTROPIUM BROMIDE MONOHYDRATE 18 MCG IN CAPS: 18.0000 ug | ORAL_CAPSULE | Freq: Every day | RESPIRATORY_TRACT | 5 refills | Status: DC

## 2017-05-29 MED ORDER — ATENOLOL 100 MG PO TABS: 100.0000 mg | ORAL_TABLET | Freq: Every day | ORAL | 0 refills | Status: DC

## 2017-05-29 MED ORDER — CALCIUM CARBONATE-VITAMIN D 600-400 MG-UNIT PO TABS: 1.0000 | ORAL_TABLET | Freq: Two times a day (BID) | ORAL | 11 refills | Status: DC

## 2017-05-29 MED ORDER — ZOSTER VAC RECOMB ADJUVANTED 50 MCG/0.5ML IM SUSR: 0.5000 mL | Freq: Once | INTRAMUSCULAR | 0 refills | Status: AC

## 2017-05-29 MED ORDER — SITAGLIPTIN PHOSPHATE 100 MG PO TABS
100.0000 mg | ORAL_TABLET | Freq: Every day | ORAL | 0 refills | Status: DC
Start: 2017-05-29 — End: 2017-05-31

## 2017-05-29 NOTE — Assessment & Plan Note (Addendum)
May use over-the-counter NSAIDs, home rehabilitation exercises given, return in 1 month of no better to consider formal physical therapy before we proceed with trochanteric bursa injection.

## 2017-05-29 NOTE — Assessment & Plan Note (Addendum)
Improved considerably after aspiration and injection, as flare has resolved we are going to check uric acid levels and treat to a level of less than 5.  Uric acid is very high at 8.3, adding allopurinol once a day, recheck nonfasting uric acid levels in one month, orders placed.

## 2017-05-29 NOTE — Progress Notes (Signed)
HPI:                                                                Tara Campbell is a 71 y.o. female who presents to [name]: [facility] today for annual physical exam  Current Concerns include medication refills, hearing loss  Patient reports she is able to perform all of her ADLs. Denies history of falls. Feels safe at home.   Health Maintenance Health Maintenance  Topic Date Due  . Hepatitis C Screening  30-Mar-1946  . DEXA SCAN  10/09/2017 (Originally 04/18/2009)  . PNA vac Low Risk Adult (2 of 2 - PPSV23) 10/09/2017 (Originally 12/04/2016)  . INFLUENZA VACCINE  07/03/2017  . OPHTHALMOLOGY EXAM  10/24/2017  . MAMMOGRAM  11/02/2017  . HEMOGLOBIN A1C  11/28/2017  . FOOT EXAM  01/14/2018  . COLONOSCOPY  01/31/2025  . TETANUS/TDAP  05/30/2027    Health Habits  Diet: "poor"  Exercise: does not exercise  Past Medical History:  Diagnosis Date  . Allergy   . Asthma   . Colon polyps   . Diabetes mellitus without complication (HCC)   . Gout   . Heart attack (HCC) 07/30/2016   PCI, 2 stents  . Hyperlipidemia   . Hypertension   . Internal hemorrhoids   . Left rotator cuff tear   . Paroxysmal atrial fibrillation (HCC)   . Thyroid disease    Past Surgical History:  Procedure Laterality Date  . ANGIOPLASTY    . COLONOSCOPY W/ POLYPECTOMY    . CORONARY/GRAFT ACUTE MI REVASCULARIZATION     Social History  Substance Use Topics  . Smoking status: Never Smoker  . Smokeless tobacco: Never Used  . Alcohol use Yes   family history includes Diabetes in her father and paternal grandmother; Glaucoma in her father; Heart disease in her father; Hyperlipidemia in her father and mother; Hypertension in her father and mother; Skin cancer in her father.  ROS: Review of Systems  Constitutional: Negative.   HENT: Positive for hearing loss. Negative for tinnitus.   Eyes: Positive for blurred vision (wears corrective lenses).  Respiratory:  Negative for sputum production, shortness of breath and wheezing.   Cardiovascular: Negative.   Gastrointestinal: Negative.   Genitourinary: Negative.   Musculoskeletal: Negative for falls and myalgias.  Skin: Negative.   Neurological: Negative.   Psychiatric/Behavioral: Negative.      Medications: Current Outpatient Prescriptions  Medication Sig Dispense Refill  . apixaban (ELIQUIS) 5 MG TABS tablet Take 1 tablet (5 mg total) by mouth 2 (two) times daily. 60 tablet 2  . clopidogrel (PLAVIX) 75 MG tablet Take 75 mg by mouth daily.    . Fluticasone Furoate-Vilanterol (BREO ELLIPTA IN) Inhale into the lungs.    . irbesartan (AVAPRO) 300 MG tablet Take 300 mg by mouth daily.    Marland Kitchen levothyroxine (SYNTHROID, LEVOTHROID) 112 MCG tablet Take 1 tablet (112 mcg total) by mouth daily before breakfast. 30 tablet 2  . metFORMIN (GLUCOPHAGE) 500 MG tablet Take 1,000 mg by mouth 2 (two) times daily with a meal.    . tiotropium (SPIRIVA) 18 MCG inhalation capsule Place 1 capsule (18 mcg total) into inhaler and inhale daily. 30 capsule 5  . allopurinol (ZYLOPRIM) 300 MG tablet Take 1  tablet (300 mg total) by mouth daily. 30 tablet 3  . atenolol (TENORMIN) 100 MG tablet Take 1 tablet (100 mg total) by mouth daily. 90 tablet 0  . atorvastatin (LIPITOR) 80 MG tablet Take 1 tablet (80 mg total) by mouth daily. 90 tablet 3  . Calcium Carbonate-Vitamin D 600-400 MG-UNIT tablet Take 1 tablet by mouth 2 (two) times daily. 60 tablet 11  . sitaGLIPtin (JANUVIA) 100 MG tablet Take 1 tablet (100 mg total) by mouth daily. 90 tablet 0   No current facility-administered medications for this visit.    Allergies  Allergen Reactions  . Sulfamethoxazole Rash    HIVES  . Latex   . Sulfa Antibiotics        Objective:  BP 115/73   Pulse 81   Resp 18   Ht 5\' 3"  (1.6 m)   Wt 188 lb 1.6 oz (85.3 kg)   BMI 33.32 kg/m  Gen: well-groomed, cooperative, not ill-appearing, no distress HEENT: normal conjunctiva,  wearing glasses, TM's clear, oropharynx clear, normal dentition, moist mucus membranes, no thyromegaly or tenderness, neck supple, trachea midline, no lymphadenopathy Pulm: Normal work of breathing, normal phonation, clear to auscultation bilaterally CV: Normal rate, regular rhythm, s1 and s2 distinct, no murmurs, clicks or rubs, no carotid bruit GI: abdomen obese, soft, nondistended, nontender, no masses Neuro: alert and oriented x 3, EOM's intact, PERRLA, DTR's intact, normal tone, no tremor MSK: extremities atraumatic, strength intact, normal gait and station, no peripheral edema Skin: warm and dry, no rashes Psych: normal affect, euthymic mood, normal speech and thought content    No results found.  Depression screen PHQ 2/9 05/29/2017  Decreased Interest 0  Down, Depressed, Hopeless 0  PHQ - 2 Score 0     Assessment and Plan: 71 y.o. female with   1. Breast cancer screening by mammogram - MM DIGITAL SCREENING BILATERAL; Future  2. Need for shingles vaccine - Zoster Vac Recomb Adjuvanted [facility]) injection; Inject 0.5 mLs into the muscle once.  Dispense: 0.5 mL; Refill: 0 - return in 2 months  3. Blurred vision - Ambulatory referral to Ophthalmology to establish care/ annual diabetic eye exam  4. Encounter for monitoring statin therapy - Lipid Panel w/reflex Direct LDL  5. Controlled type 2 diabetes mellitus without complication, without long-term current use of insulin (HCC) - Hemoglobin A1c - CBC - COMPLETE METABOLIC PANEL WITH GFR  6. Acquired hypothyroidism - TSH - levothyroxine (SYNTHROID, LEVOTHROID) 112 MCG tablet; Take 1 tablet (112 mcg total) by mouth daily before breakfast.  Dispense: 30 tablet; Refill: 2 - TSH; Future  7. Moderate persistent asthma without complication - tiotropium (SPIRIVA) 18 MCG inhalation capsule; Place 1 capsule (18 mcg total) into inhaler and inhale daily.  Dispense: 30 capsule; Refill: 5  8. Paroxysmal atrial fibrillation  (HCC) - followed by cardiology - regular rhythm on exam today - doing well on Eliquis  9. Hearing difficulty of both ears - Ambulatory referral to Audiology  10. Annual physical exam - Pneumonia: UTD per patient - Pap: no longer indicated - Colonoscopy: 2016 [facility], requesting outside records - Dexa: UTD per patient - Mammogram: ordered - Tdap: updated - Shingles: prescription provided - negative PHQ2 - negative fall screen  11. Need for Tdap vaccination - Tdap vaccine greater than or equal to 7yo IM  12. Hyperlipidemia associated with type 2 diabetes mellitus (HCC) - atorvastatin (LIPITOR) 80 MG tablet; Take 1 tablet (80 mg total) by mouth daily.  Dispense: 90 tablet; Refill: 3 -  Lipid Panel w/reflex Direct LDL; Future   Patient education and anticipatory guidance given Patient agrees with treatment plan Follow-up in 3 months for chronic medication mgmt or sooner as needed  Levonne Hubert PA-C

## 2017-05-29 NOTE — Telephone Encounter (Signed)
Patient was seen this morning by [facility]. One of her prescriptions was sent to [facility] when it needs to go [facility]. Thanks!

## 2017-05-29 NOTE — Patient Instructions (Addendum)
Return in 2 months for Shingrix prescription # 2   Preventive Care 65 Years and Older, Female Preventive care refers to lifestyle choices and visits with your health care provider that can promote health and wellness. What does preventive care include?  A yearly physical exam. This is also called an annual well check.  Dental exams once or twice a year.  Routine eye exams. Ask your health care provider how often you should have your eyes checked.  Personal lifestyle choices, including: ? Daily care of your teeth and gums. ? Regular physical activity. ? Eating a healthy diet. ? Avoiding tobacco and drug use. ? Limiting alcohol use. ? Practicing safe sex. ? Taking low-dose aspirin every day. ? Taking vitamin and mineral supplements as recommended by your health care provider. What happens during an annual well check? The services and screenings done by your health care provider during your annual well check will depend on your age, overall health, lifestyle risk factors, and family history of disease. Counseling Your health care provider may ask you questions about your:  Alcohol use.  Tobacco use.  Drug use.  Emotional well-being.  Home and relationship well-being.  Sexual activity.  Eating habits.  History of falls.  Memory and ability to understand (cognition).  Work and work Statistician.  Reproductive health.  Screening You may have the following tests or measurements:  Height, weight, and BMI.  Blood pressure.  Lipid and cholesterol levels. These may be checked every 5 years, or more frequently if you are over 74 years old.  Skin check.  Lung cancer screening. You may have this screening every year starting at age 43 if you have a 30-pack-year history of smoking and currently smoke or have quit within the past 15 years.  Fecal occult blood test (FOBT) of the stool. You may have this test every year starting at age 13.  Flexible sigmoidoscopy or  colonoscopy. You may have a sigmoidoscopy every 5 years or a colonoscopy every 10 years starting at age 66.  Hepatitis C blood test.  Hepatitis B blood test.  Sexually transmitted disease (STD) testing.  Diabetes screening. This is done by checking your blood sugar (glucose) after you have not eaten for a while (fasting). You may have this done every 1-3 years.  Bone density scan. This is done to screen for osteoporosis. You may have this done starting at age 37.  Mammogram. This may be done every 1-2 years. Talk to your health care provider about how often you should have regular mammograms.  Talk with your health care provider about your test results, treatment options, and if necessary, the need for more tests. Vaccines Your health care provider may recommend certain vaccines, such as:  Influenza vaccine. This is recommended every year.  Tetanus, diphtheria, and acellular pertussis (Tdap, Td) vaccine. You may need a Td booster every 10 years.  Varicella vaccine. You may need this if you have not been vaccinated.  Zoster vaccine. You may need this after age 45.  Measles, mumps, and rubella (MMR) vaccine. You may need at least one dose of MMR if you were born in 1957 or later. You may also need a second dose.  Pneumococcal 13-valent conjugate (PCV13) vaccine. One dose is recommended after age 6.  Pneumococcal polysaccharide (PPSV23) vaccine. One dose is recommended after age 71.  Meningococcal vaccine. You may need this if you have certain conditions.  Hepatitis A vaccine. You may need this if you have certain conditions or if you travel  or work in places where you may be exposed to hepatitis A.  Hepatitis B vaccine. You may need this if you have certain conditions or if you travel or work in places where you may be exposed to hepatitis B.  Haemophilus influenzae type b (Hib) vaccine. You may need this if you have certain conditions.  Talk to your health care provider about  which screenings and vaccines you need and how often you need them. This information is not intended to replace advice given to you by your health care provider. Make sure you discuss any questions you have with your health care provider. Document Released: 12/16/2015 Document Revised: 08/08/2016 Document Reviewed: 09/20/2015 Elsevier Interactive Patient Education  2017 [place] protect organs, store calcium, and anchor muscles. Good health habits, such as eating nutritious foods and exercising regularly, are important for maintaining healthy bones. They can also help to prevent a condition that causes bones to lose density and become weak and brittle (osteoporosis). Why is bone mass important? Bone mass refers to the amount of bone tissue that you have. The higher your bone mass, the stronger your bones. An important step toward having healthy bones throughout life is to have strong and dense bones during childhood. A young adult who has a high bone mass is more likely to have a high bone mass later in life. Bone mass at its greatest it is called peak bone mass. A large decline in bone mass occurs in older adults. In women, it occurs about the time of menopause. During this time, it is important to practice good health habits, because if more bone is lost than what is replaced, the bones will become less healthy and more likely to break (fracture). If you find that you have a low bone mass, you may be able to prevent osteoporosis or further bone loss by changing your diet and lifestyle. How can I find out if my bone mass is low? Bone mass can be measured with an X-ray test that is called a bone mineral density (BMD) test. This test is recommended for all women who are age 26 or older. It may also be recommended for men who are age 12 or older, or for people who are more likely to develop osteoporosis due to:  Having bones that break easily.  Having a long-term disease that  weakens bones, such as kidney disease or rheumatoid arthritis.  Having menopause earlier than normal.  Taking medicine that weakens bones, such as steroids, thyroid hormones, or hormone treatment for breast cancer or prostate cancer.  Smoking.  Drinking three or more alcoholic drinks each day.  What are the nutritional recommendations for healthy bones? To have healthy bones, you need to get enough of the right minerals and vitamins. Most nutrition experts recommend getting these nutrients from the foods that you eat. Nutritional recommendations vary from person to person. Ask your health care provider what is healthy for you. Here are some general guidelines. Calcium Recommendations Calcium is the most important (essential) mineral for bone health. Most people can get enough calcium from their diet, but supplements may be recommended for people who are at risk for osteoporosis. Good sources of calcium include:  Dairy products, such as low-fat or nonfat milk, cheese, and yogurt.  Dark green leafy vegetables, such as bok choy and broccoli.  Calcium-fortified foods, such as orange juice, cereal, bread, soy beverages, and tofu products.  Nuts, such as almonds.  Follow these recommended amounts for  daily calcium intake:  Children, age 72?3: 700 mg.  Children, age 31?8: 1,000 mg.  Children, age 73?13: 1,300 mg.  Teens, age 38?18: 1,300 mg.  Adults, age 63?50: 1,000 mg.  Adults, age 84?70: ? Men: 1,000 mg. ? Women: 1,200 mg.  Adults, age 58 or older: 1,200 mg.  Pregnant and breastfeeding females: ? Teens: 1,300 mg. ? Adults: 1,000 mg.  Vitamin D Recommendations Vitamin D is the most essential vitamin for bone health. It helps the body to absorb calcium. Sunlight stimulates the skin to make vitamin D, so be sure to get enough sunlight. If you live in a cold climate or you do not get outside often, your health care provider may recommend that you take vitamin D supplements. Good  sources of vitamin D in your diet include:  Egg yolks.  Saltwater fish.  Milk and cereal fortified with vitamin D.  Follow these recommended amounts for daily vitamin D intake:  Children and teens, age 66?18: 33 international units.  Adults, age 58 or younger: 400-800 international units.  Adults, age 37 or older: 800-1,000 international units.  Other Nutrients Other nutrients for bone health include:  Phosphorus. This mineral is found in meat, poultry, dairy foods, nuts, and legumes. The recommended daily intake for adult men and adult women is 700 mg.  Magnesium. This mineral is found in seeds, nuts, dark green vegetables, and legumes. The recommended daily intake for adult men is 400?420 mg. For adult women, it is 310?320 mg.  Vitamin K. This vitamin is found in green leafy vegetables. The recommended daily intake is 120 mg for adult men and 90 mg for adult women.  What type of physical activity is best for building and maintaining healthy bones? Weight-bearing and strength-building activities are important for building and maintaining peak bone mass. Weight-bearing activities cause muscles and bones to work against gravity. Strength-building activities increases muscle strength that supports bones. Weight-bearing and muscle-building activities include:  Walking and hiking.  Jogging and running.  Dancing.  Gym exercises.  Lifting weights.  Tennis and racquetball.  Climbing stairs.  Aerobics.  Adults should get at least 30 minutes of moderate physical activity on most days. Children should get at least 60 minutes of moderate physical activity on most days. Ask your health care provide what type of exercise is best for you. Where can I find more information? For more information, check out the following websites:  [place]: YardHomes.se  [employer] of Health:  http://www.niams.AnonymousEar.fr.asp  This information is not intended to replace advice given to you by your health care provider. Make sure you discuss any questions you have with your health care provider. Document Released: 02/09/2004 Document Revised: 06/08/2016 Document Reviewed: 11/24/2014 Elsevier Interactive Patient Education  [employer].

## 2017-05-29 NOTE — Progress Notes (Addendum)
   Subjective:    I'm seeing this patient as a consultation for:  Tara Fray, PA-C  CC: Right hip pain  HPI: This is a pleasant 71 year old female, for several months she's had pain that she localizes on the lateral aspect of her right hip, worse with prolonged walking and lying on the lateral side, moderate, persistent without radiation.  In addition, at the last visit we did a left subtalar joint arthrocentesis with crystal analysis that showed uric acid crystals, the joint was injected and all of her pain and swelling resolved the next day. She does have some low-grade polyarthralgia.  Past medical history:  Negative.  See flowsheet/record as well for more information.  Surgical history: Negative.  See flowsheet/record as well for more information.  Family history: Negative.  See flowsheet/record as well for more information.  Social history: Negative.  See flowsheet/record as well for more information.  Allergies, and medications have been entered into the medical record, reviewed, and no changes needed.   Review of Systems: No headache, visual changes, nausea, vomiting, diarrhea, constipation, dizziness, abdominal pain, skin rash, fevers, chills, night sweats, weight loss, swollen lymph nodes, body aches, joint swelling, muscle aches, chest pain, shortness of breath, mood changes, visual or auditory hallucinations.   Objective:   General: Well Developed, well nourished, and in no acute distress.  Neuro/Psych: Alert and oriented x3, extra-ocular muscles intact, able to move all 4 extremities, sensation grossly intact. Skin: Warm and dry, no rashes noted.  Respiratory: Not using accessory muscles, speaking in full sentences, trachea midline.  Cardiovascular: Pulses palpable, no extremity edema. Abdomen: Does not appear distended. Right Hip: ROM IR: 60 Deg, ER: 60 Deg, Flexion: 120 Deg, Extension: 100 Deg, Abduction: 45 Deg, Adduction: 45 Deg Strength IR: 5/5, ER: 5/5, Flexion:  5/5, Extension: 5/5, Abduction: 5/5, Adduction: 5/5 Pelvic alignment unremarkable to inspection and palpation. Standing hip rotation and gait without trendelenburg / unsteadiness. Greater trochanter with mild tenderness to palpation. No tenderness over piriformis. No SI joint tenderness and normal minimal SI movement. Left Ankle: No visible erythema or swelling. Range of motion is full in all directions. Strength is 5/5 in all directions. Stable lateral and medial ligaments; squeeze test and kleiger test unremarkable; Talar dome nontender; No pain at base of 5th MT; No tenderness over cuboid; No tenderness over N spot or navicular prominence No tenderness on posterior aspects of lateral and medial malleolus No sign of peroneal tendon subluxations; Negative tarsal tunnel tinel's Able to walk 4 steps.  Impression and Recommendations:   This case required medical decision making of moderate complexity.  Gout of ankle Improved considerably after aspiration and injection, as flare has resolved we are going to check uric acid levels and treat to a level of less than 5.  Trochanteric bursitis, right hip May use over-the-counter NSAIDs, home rehabilitation exercises given, return in 1 month of no better to consider formal physical therapy before we proceed with trochanteric bursa injection.

## 2017-05-30 LAB — COMPLETE METABOLIC PANEL WITH GFR
ALBUMIN: 4.2 g/dL (ref 3.6–5.1)
ALK PHOS: 100 U/L (ref 33–130)
ALT: 13 U/L (ref 6–29)
AST: 13 U/L (ref 10–35)
BILIRUBIN TOTAL: 0.6 mg/dL (ref 0.2–1.2)
BUN: 14 mg/dL (ref 7–25)
CO2: 22 mmol/L (ref 20–31)
Calcium: 10.4 mg/dL (ref 8.6–10.4)
Chloride: 102 mmol/L (ref 98–110)
Creat: 0.86 mg/dL (ref 0.60–0.93)
GFR, Est African American: 78 mL/min (ref >=60)
GFR, Est Non African American: 67 mL/min (ref >=60)
GLUCOSE: 113 mg/dL — AB (ref 65–99)
Potassium: 4.2 mmol/L (ref 3.5–5.3)
SODIUM: 140 mmol/L (ref 135–146)
TOTAL PROTEIN: 7.3 g/dL (ref 6.1–8.1)

## 2017-05-30 LAB — LIPID PANEL W/REFLEX DIRECT LDL
Cholesterol: 287 mg/dL — ABNORMAL HIGH (ref <200)
HDL: 48 mg/dL — ABNORMAL LOW (ref >50)
LDL-Cholesterol: 183 mg/dL — ABNORMAL HIGH
Non-HDL Cholesterol (Calc): 239 mg/dL — ABNORMAL HIGH (ref <130)
Total CHOL/HDL Ratio: 6 Ratio — ABNORMAL HIGH (ref <5.0)
Triglycerides: 330 mg/dL — ABNORMAL HIGH (ref <150)

## 2017-05-30 LAB — HEMOGLOBIN A1C
Hgb A1c MFr Bld: 6.5 % — ABNORMAL HIGH (ref <5.7)
MEAN PLASMA GLUCOSE: 140 mg/dL

## 2017-05-30 LAB — URIC ACID: Uric Acid, Serum: 8.3 mg/dL — ABNORMAL HIGH (ref 2.5–7.0)

## 2017-05-30 MED ORDER — ALLOPURINOL 300 MG PO TABS: 300.0000 mg | ORAL_TABLET | Freq: Every day | ORAL | 3 refills | Status: DC

## 2017-05-30 NOTE — Addendum Note (Signed)
Addended by: Monica Becton on: 05/30/2017 10:52 AM   Modules accepted: Orders

## 2017-05-31 ENCOUNTER — Encounter: Payer: Self-pay | Admitting: Physician Assistant

## 2017-05-31 ENCOUNTER — Other Ambulatory Visit: Payer: Self-pay

## 2017-05-31 DIAGNOSIS — I480 Paroxysmal atrial fibrillation: Secondary | ICD-10-CM

## 2017-05-31 DIAGNOSIS — Z23 Encounter for immunization: Secondary | ICD-10-CM | POA: Insufficient documentation

## 2017-05-31 DIAGNOSIS — E119 Type 2 diabetes mellitus without complications: Secondary | ICD-10-CM

## 2017-05-31 MED ORDER — SITAGLIPTIN PHOSPHATE 100 MG PO TABS: 100.0000 mg | ORAL_TABLET | Freq: Every day | ORAL | 0 refills | Status: DC

## 2017-05-31 MED ORDER — ATORVASTATIN CALCIUM 80 MG PO TABS
80.0000 mg | ORAL_TABLET | Freq: Every day | ORAL | 3 refills | Status: DC
Start: 2017-05-31 — End: 2018-07-24

## 2017-05-31 MED ORDER — LEVOTHYROXINE SODIUM 112 MCG PO TABS: 112.0000 ug | ORAL_TABLET | Freq: Every day | ORAL | 2 refills | Status: DC

## 2017-05-31 MED ORDER — ATENOLOL 100 MG PO TABS: 100.0000 mg | ORAL_TABLET | Freq: Every day | ORAL | 0 refills | Status: DC

## 2017-05-31 NOTE — Telephone Encounter (Signed)
Called pt and necessary meds changed. Pt states she's out of town and will call if any others need to be sent.

## 2017-05-31 NOTE — Progress Notes (Signed)
1. LDL cholesterol is very high. Need to increase Atorvastatin to 80mg  daily 2. Increase Levothyroxine to every morning before meals for thyroid Recheck fasting labs in 6 weeks Both prescriptions have been sent to [facility] pharmacy

## 2017-06-07 ENCOUNTER — Other Ambulatory Visit: Payer: Self-pay | Admitting: *Deleted

## 2017-06-07 DIAGNOSIS — E1169 Type 2 diabetes mellitus with other specified complication: Secondary | ICD-10-CM

## 2017-06-07 DIAGNOSIS — E039 Hypothyroidism, unspecified: Secondary | ICD-10-CM

## 2017-06-07 DIAGNOSIS — E785 Hyperlipidemia, unspecified: Secondary | ICD-10-CM

## 2017-06-07 DIAGNOSIS — M1A0790 Idiopathic chronic gout, unspecified ankle and foot, without tophus (tophi): Secondary | ICD-10-CM

## 2017-06-12 ENCOUNTER — Encounter: Payer: Self-pay | Admitting: Physician Assistant

## 2017-08-07 ENCOUNTER — Other Ambulatory Visit: Payer: Self-pay | Admitting: Physician Assistant

## 2017-08-07 DIAGNOSIS — E119 Type 2 diabetes mellitus without complications: Secondary | ICD-10-CM

## 2017-08-22 LAB — LIPID PANEL W/REFLEX DIRECT LDL
CHOLESTEROL: 183 mg/dL (ref <200)
HDL: 43 mg/dL — AB (ref >50)
LDL Cholesterol (Calc): 109 mg/dL (calc) — ABNORMAL HIGH
Non-HDL Cholesterol (Calc): 140 mg/dL (calc) — ABNORMAL HIGH (ref <130)
Total CHOL/HDL Ratio: 4.3 (calc) (ref <5.0)
Triglycerides: 190 mg/dL — ABNORMAL HIGH (ref <150)

## 2017-08-22 LAB — URIC ACID: Uric Acid, Serum: 6.9 mg/dL (ref 2.5–7.0)

## 2017-08-22 LAB — TSH: TSH: 2.13 mIU/L (ref 0.40–4.50)

## 2017-08-22 NOTE — Progress Notes (Signed)
Cholesterol is significantly better Continue your Atorvastatin nightly and low-cholesterol diet Thyroid is in a normal range. Recheck in 6 months

## 2017-08-23 MED ORDER — ALLOPURINOL 300 MG PO TABS: 300.0000 mg | ORAL_TABLET | Freq: Two times a day (BID) | ORAL | 3 refills | Status: DC

## 2017-08-23 NOTE — Assessment & Plan Note (Signed)
Uric acid levels are 6.9, allopurinol is being taken daily, increase to twice a day and recheck levels in one month. Goal is less than 5-6.

## 2017-08-29 ENCOUNTER — Encounter: Payer: Self-pay | Admitting: Physician Assistant

## 2017-08-29 ENCOUNTER — Ambulatory Visit (INDEPENDENT_AMBULATORY_CARE_PROVIDER_SITE_OTHER): Payer: Medicare Other | Admitting: Physician Assistant

## 2017-08-29 VITALS — BP 137/72 | HR 81 | Wt 197.0 lb

## 2017-08-29 DIAGNOSIS — I252 Old myocardial infarction: Secondary | ICD-10-CM

## 2017-08-29 DIAGNOSIS — E039 Hypothyroidism, unspecified: Secondary | ICD-10-CM | POA: Diagnosis not present

## 2017-08-29 DIAGNOSIS — Z1159 Encounter for screening for other viral diseases: Secondary | ICD-10-CM | POA: Diagnosis not present

## 2017-08-29 DIAGNOSIS — I2510 Atherosclerotic heart disease of native coronary artery without angina pectoris: Secondary | ICD-10-CM | POA: Insufficient documentation

## 2017-08-29 DIAGNOSIS — J4540 Moderate persistent asthma, uncomplicated: Secondary | ICD-10-CM | POA: Diagnosis not present

## 2017-08-29 DIAGNOSIS — Z23 Encounter for immunization: Secondary | ICD-10-CM | POA: Diagnosis not present

## 2017-08-29 DIAGNOSIS — Z79899 Other long term (current) drug therapy: Secondary | ICD-10-CM | POA: Diagnosis not present

## 2017-08-29 DIAGNOSIS — I10 Essential (primary) hypertension: Secondary | ICD-10-CM | POA: Diagnosis not present

## 2017-08-29 DIAGNOSIS — I25119 Atherosclerotic heart disease of native coronary artery with unspecified angina pectoris: Secondary | ICD-10-CM | POA: Insufficient documentation

## 2017-08-29 DIAGNOSIS — E119 Type 2 diabetes mellitus without complications: Secondary | ICD-10-CM | POA: Diagnosis not present

## 2017-08-29 MED ORDER — ALBUTEROL SULFATE HFA 108 (90 BASE) MCG/ACT IN AERS: 2.0000 | INHALATION_SPRAY | Freq: Four times a day (QID) | RESPIRATORY_TRACT | 1 refills | Status: DC | PRN

## 2017-08-29 MED ORDER — IRBESARTAN-HYDROCHLOROTHIAZIDE 300-12.5 MG PO TABS: 1.0000 | ORAL_TABLET | Freq: Every day | ORAL | 1 refills | Status: DC

## 2017-08-29 MED ORDER — CLOPIDOGREL BISULFATE 75 MG PO TABS: 75.0000 mg | ORAL_TABLET | Freq: Every day | ORAL | 0 refills | Status: DC

## 2017-08-29 MED ORDER — METFORMIN HCL 1000 MG PO TABS: 1000.0000 mg | ORAL_TABLET | Freq: Two times a day (BID) | ORAL | 0 refills | Status: DC

## 2017-08-29 MED ORDER — LEVOTHYROXINE SODIUM 112 MCG PO TABS: 112.0000 ug | ORAL_TABLET | Freq: Every day | ORAL | 2 refills | Status: DC

## 2017-08-29 MED ORDER — FLUTICASONE FUROATE-VILANTEROL 100-25 MCG/INH IN AEPB: 1.0000 | INHALATION_SPRAY | Freq: Every day | RESPIRATORY_TRACT | 2 refills | Status: DC

## 2017-08-29 NOTE — Patient Instructions (Addendum)
Mammogram on the 1st floor in Imaging Department [phone]  I'm contacting Dr. Jens Som to find out if you can be taking both Plavix and Eliquis, as this has an increased risk of bleeding. I did not fill your Plavix for now until I hear from him

## 2017-08-29 NOTE — Progress Notes (Signed)
HPI:                                                                Tara Campbell is a 71 y.o. female who presents to [name]: Primary Care Sports Medicine today for medication refills  Patient his here for 79-month follow-up and medication refills. She has no concerns.  Athma: using Breo daily. No recent exacerbations. Using rescue inhaler less than twice per week. Denies wheezing or shortness of breath.  HTN: taking Irbesartan/HCTZ daily. Compliant with medications. Does not check BP's at home. Denies vision change, headache, chest pain with exertion, orthopnea, lightheadedness, syncope and edema. Risk factors include: HLD, obesity  CAD: has not yet established with Dr. Jens Som in Cardiology. Taking Plavix daily. Compliant with medications. Denies chest pain with exertion or claudication.  Hypothyroidism: taking Synthroid. Compliant with medications. Denies symptoms of hypo/hyperthyroidism.   Past Medical History:  Diagnosis Date  . Allergy   . Asthma   . Colon polyps   . Diabetes mellitus without complication (HCC)   . Gout   . Heart attack (HCC) 07/30/2016   PCI, 2 stents  . Hyperlipidemia   . Hypertension   . Internal hemorrhoids   . Left rotator cuff tear   . Paroxysmal atrial fibrillation (HCC)   . Thyroid disease    Past Surgical History:  Procedure Laterality Date  . ANGIOPLASTY    . COLONOSCOPY W/ POLYPECTOMY    . CORONARY/GRAFT ACUTE MI REVASCULARIZATION    . HEMORRHOID BANDING     Social History  Substance Use Topics  . Smoking status: Never Smoker  . Smokeless tobacco: Never Used  . Alcohol use Yes   family history includes Diabetes in her father and paternal grandmother; Glaucoma in her father; Heart disease in her father; Hyperlipidemia in her father and mother; Hypertension in her father and mother; Skin cancer in her father.  ROS: negative except as noted in the HPI  Medications: Current Outpatient Prescriptions   Medication Sig Dispense Refill  . irbesartan-hydrochlorothiazide (AVALIDE) 300-12.5 MG tablet Take 1 tablet by mouth daily. 90 tablet 1  . albuterol (PROVENTIL HFA;VENTOLIN HFA) 108 (90 Base) MCG/ACT inhaler Inhale 2 puffs into the lungs every 6 (six) hours as needed for wheezing. 2 Inhaler 1  . allopurinol (ZYLOPRIM) 300 MG tablet Take 1 tablet (300 mg total) by mouth 2 (two) times daily. 60 tablet 3  . apixaban (ELIQUIS) 5 MG TABS tablet Take 1 tablet (5 mg total) by mouth 2 (two) times daily. 60 tablet 2  . atenolol (TENORMIN) 100 MG tablet Take 1 tablet (100 mg total) by mouth daily. 90 tablet 0  . atorvastatin (LIPITOR) 80 MG tablet Take 1 tablet (80 mg total) by mouth daily. 90 tablet 3  . Calcium Carbonate-Vitamin D 600-400 MG-UNIT tablet Take 1 tablet by mouth 2 (two) times daily. 60 tablet 11  . clopidogrel (PLAVIX) 75 MG tablet Take 1 tablet (75 mg total) by mouth daily. 90 tablet 0  . fluticasone furoate-vilanterol (BREO ELLIPTA) 100-25 MCG/INH AEPB Inhale 1 puff into the lungs daily. 1 each 2  . levothyroxine (SYNTHROID, LEVOTHROID) 112 MCG tablet Take 1 tablet (112 mcg total) by mouth daily before breakfast. 30 tablet 2  . metFORMIN (GLUCOPHAGE) 1000 MG tablet Take 1  tablet (1,000 mg total) by mouth 2 (two) times daily with a meal. 180 tablet 0  . sitaGLIPtin (JANUVIA) 100 MG tablet Take 1 tablet (100 mg total) by mouth daily. Due for follow up visit 90 tablet 0  . tiotropium (SPIRIVA) 18 MCG inhalation capsule Place 1 capsule (18 mcg total) into inhaler and inhale daily. 30 capsule 5   No current facility-administered medications for this visit.    Allergies  Allergen Reactions  . Sulfamethoxazole Rash    HIVES  . Latex   . Sulfa Antibiotics        Objective:  BP 137/72   Pulse 81   Wt 197 lb (89.4 kg)   BMI 34.90 kg/m  Gen:  alert, not ill-appearing, no distress, appropriate for age HEENT: head normocephalic without obvious abnormality, conjunctiva and cornea  clear, wearing glasses, trachea midline Pulm: Normal work of breathing, normal phonation, clear to auscultation bilaterally, no wheezes, rales or rhonchi CV: Normal rate, regular rhythm, s1 and s2 distinct, no murmurs, clicks or rubs  Neuro: alert and oriented x 3, no tremor MSK: extremities atraumatic, normal gait and station Skin: intact, no rashes on exposed skin, no jaundice, no cyanosis Psych: well-groomed, cooperative, good eye contact, euthymic mood, affect mood-congruent, speech is articulate, and thought processes clear and goal-directed    No results found for this or any previous visit (from the past 72 hour(s)). No results found.   Assessment and Plan: 71 y.o. female with   1. Acquired hypothyroidism Lab Results  Component Value Date   TSH 2.13 08/22/2017  - continue current dose. Recheck TSH in 6 months. - levothyroxine (SYNTHROID, LEVOTHROID) 112 MCG tablet; Take 1 tablet (112 mcg total) by mouth daily before breakfast.  Dispense: 30 tablet; Refill: 2  2. Moderate persistent asthma without complication - influenza and pneumococcal vaccines updated today - albuterol (PROVENTIL HFA;VENTOLIN HFA) 108 (90 Base) MCG/ACT inhaler; Inhale 2 puffs into the lungs every 6 (six) hours as needed for wheezing.  Dispense: 2 Inhaler; Refill: 1 - fluticasone furoate-vilanterol (BREO ELLIPTA) 100-25 MCG/INH AEPB; Inhale 1 puff into the lungs daily.  Dispense: 1 each; Refill: 2  3. Controlled type 2 diabetes mellitus without complication, without long-term current use of insulin (HCC) Lab Results  Component Value Date   HGBA1C 6.5 (H) 05/29/2017  - continue Metformin. Consider switch from Sitagliptin to Jardiance for CV mortality benefit at follow-up in 3 months - recheck A1C in 3 months   4. 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 Jens Som and okay to continue Plavix and Eliquis - on statin therapy. LDL not yet at goal. Plan to recheck in 3 months  and increase to 80mg  nightly if LDL is not <70 - clopidogrel (PLAVIX) 75 MG tablet; Take 1 tablet (75 mg total) by mouth daily.  Dispense: 90 tablet; Refill: 0  5. Need for hepatitis C screening test - Hepatitis C antibody  6. Encounter for medication management  7. Need for 23-polyvalent pneumococcal polysaccharide vaccine - Pneumococcal polysaccharide vaccine 23-valent greater than or equal to 2yo subcutaneous/IM  8. Needs flu shot - Flu vaccine HIGH DOSE PF (Fluzone High dose)  9. Coronary artery disease involving native coronary artery of native heart without angina pectoris - clopidogrel (PLAVIX) 75 MG tablet; Take 1 tablet (75 mg total) by mouth daily.  Dispense: 90 tablet; Refill: 0  10. Essential hypertension -  BP Readings from Last 3 Encounters:  08/29/17 137/72  05/29/17 115/73  05/29/17 115/73  -  BP in range today - continue ARB/thiazide - therapeutic lifestyle changes - irbesartan-hydrochlorothiazide (AVALIDE) 300-12.5 MG tablet; Take 1 tablet by mouth daily.  Dispense: 90 tablet; Refill: 1  Patient education and anticipatory guidance given Patient agrees with treatment plan Follow-up in 3 months or sooner as needed if symptoms worsen or fail to improve  Levonne Hubert PA-C

## 2017-08-30 ENCOUNTER — Telehealth: Payer: Self-pay

## 2017-08-30 LAB — HEPATITIS C ANTIBODY
Hepatitis C Ab: NONREACTIVE
SIGNAL TO CUT-OFF: 0.01 (ref <1.00)

## 2017-08-30 NOTE — Progress Notes (Signed)
Negative hepatitis C .

## 2017-08-30 NOTE — Telephone Encounter (Signed)
Recommendations left on vm -EH/RMA  

## 2017-08-30 NOTE — Telephone Encounter (Signed)
-----   Message from [facility], [place] sent at 08/29/2017 11:25 AM EDT ----- Cardiology says it is okay to continue her Plavix and Eliquis Avoid all aspirin products

## 2017-09-04 ENCOUNTER — Ambulatory Visit (INDEPENDENT_AMBULATORY_CARE_PROVIDER_SITE_OTHER): Payer: Medicare Other

## 2017-09-04 DIAGNOSIS — Z1231 Encounter for screening mammogram for malignant neoplasm of breast: Secondary | ICD-10-CM | POA: Diagnosis not present

## 2017-09-10 LAB — HM DIABETES EYE EXAM

## 2017-09-10 NOTE — Progress Notes (Signed)
Normal mammogram Recommend repeat screening in 1 year

## 2017-09-18 ENCOUNTER — Ambulatory Visit: Payer: Medicare Other | Attending: Physician Assistant | Admitting: Audiology

## 2017-09-18 DIAGNOSIS — H93299 Other abnormal auditory perceptions, unspecified ear: Secondary | ICD-10-CM | POA: Diagnosis present

## 2017-09-18 DIAGNOSIS — R292 Abnormal reflex: Secondary | ICD-10-CM | POA: Diagnosis present

## 2017-09-18 DIAGNOSIS — H903 Sensorineural hearing loss, bilateral: Secondary | ICD-10-CM | POA: Diagnosis present

## 2017-09-18 DIAGNOSIS — Z01118 Encounter for examination of ears and hearing with other abnormal findings: Secondary | ICD-10-CM | POA: Diagnosis present

## 2017-09-18 DIAGNOSIS — R94128 Abnormal results of other function studies of ear and other special senses: Secondary | ICD-10-CM | POA: Insufficient documentation

## 2017-09-18 NOTE — Procedures (Signed)
Outpatient Audiology and [facility]  [address]  [place], [place] [address]  [phone]   Audiological Evaluation  Patient Name: Tara Campbell   Status: Outpatient   DOB: 12/18/1945    Diagnosis: Hearing Loss, bilateral                MRN: [id number] Date:  09/18/2017     Referent: Carlis Stable, PA-C  History: Tara Campbell was seen for an audiological evaluation.  Primary Concern: "asking people to repeat", "having trouble following conversation and movies in background". Pain: None History of hearing problems: Y - for "at least the past 4-5 years".  History of ear infections:  Y - as a child with "ear pain", none recently. 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 History of hypertension: Y - controlled with medication. History of diabetes:  Y - controlled with medication (oral). Family history of hearing loss: Father developed hearing loss in his mid-70's.     Evaluation: Conventional pure tone audiometry from 250Hz  - 8000Hz  with using insert earphones.  Hearing Thresholds show symmetrical hearing loss of 35 dBHLat 250Hz ; 25-30 dbHLat 500Hz ; 20-25 dBHL from 1000hz  - 2000Hz ; 30-40 dBHL at 4000Hz ; 40-45 dBHL at 8000Hz  (hearing is slightly poorer on the left side). The hearing loss is sensorineural bilaterally. Reliability is good Speech reception levels (repeating words near threshold) using recorded spondee word lists:  Right ear: 20 dBHL.  Left ear:  20 dBHL Word recognition (at comfortably loud volumes) using recorded word lists at 60 dBHL, in quiet.  Right ear: 100%.  Left ear:   96% Word recognition in minimal background noise:  +5 dBHL  Right ear: 72%                              Left ear:  60%  Tympanometry (middle ear function) shows  normal middle ear volume, pressure and compliance bilaterally. Ipsilateral acoustic reflexes range from present to no response at 4000Hz  bilaterally.   CONCLUSION:      Tara Campbell has bilateral, fairly symmetrical hearing loss that is mild in the low frequencies, improving to normal in the mid range and sloping in the high frequencies to a mild to moderate hearing loss that is sensorineural bilaterally.  This amount of hearing loss would adversely affect speech communication at normal conversational speech levels; missing even more when a competing message is present. Word recognition is excellent in each ear in quiet at conversational speech levels. In minimal background noise, word recognition drops to fair in the right ear and to poor in the left ear. In minimal background noise missing 25-40% or what is said is expected.  Middle ear function is within normal limits in each ear with acoustic reflexes that range from present and within normal limits to elevated to no response at 4000Hz  bilaterally - these results are consistent with the degree of hearing loss.      Tara Campbell may be an excellent candidate and benefit from amplification; therefore a hearing aid evaluation is recommended.  Amplification helps make the signal louder and therefore often improves hearing and word recognition.  Amplification has many forms including hearing aids in one or both ears, an assistive listening device which have a microphone  and speaker such as a small handheld device and/or even a surround sound system of speakers.  Amplification may be covered by some insurances, but not all.  It is important to note that hearing aids must be individually fit according to the hearing test results and the ear shape.  Audiologists and hearing aid dealers in [place] must be licensed in order to dispense hearing aids.  In addition, a trial period is mandated by law in our state because often amplification must be tried and then  evaluated in order to determine benefit.  here are many excellent choices when it comes to amplification in our area and providers are listed in the phone book under hearing aids, may be affiliated with Ear, Nose and Throat physicians, or may be located at [employer] and [employer].  The test results were discussed and Tara ArenasSandra B Campbell counseled.   RECOMMENDATIONS:: 1) Check insurance for hearing aid benefits.  2)  Contact local audiologists for a hearing aid evaluation  (ie. [facility], [facility] and/or [facility]). 3) Strategies that help improve hearing include: A) Face the speaker directly. Optimal is having the speakers face well - lit.  Unless amplified, being within 3-6 feet of the speaker will enhance word recognition. B) Avoid having the speaker back-lit as this will minimize the ability to use cues from lip-reading, facial expression and gestures. C)  Word recognition is poorer in background noise. For optimal word recognition, turn off the TV, radio or noisy fan when engaging in conversation. In a restaurant, try to sit away from noise sources and close to the primary speaker.  D)  Ask for topic clarification from time to time in order to remain in the conversation.  Most people don't mind repeating or clarifying a point when asked.  If needed, explain the difficulty hearing in background noise or hearing loss. 4)  Monitor hearing annually, earlier if there is a change in hearing or concerns develop.    Tara Campbell, Au.D., CCC-A Doctor of Audiology 09/18/2017  cc: Carlis Stableummings, Charley Elizabeth, PA-C

## 2017-09-18 NOTE — Patient Instructions (Addendum)
Amplification helps make the signal louder and therefore often improves hearing and word recognition.  Amplification has many forms including hearing aids in one or both ears, an assistive listening device which have a microphone and speaker such as a small handheld device and/or even a surround sound system of speakers.  Amplification may be covered by some insurances, but not all.  It is important to note that hearing aids must be individually fit according to the hearing test results and the ear shape.  Audiologists and hearing aid dealers in [place] must be licensed in order to dispense hearing aids.  In addition, a trial period is mandated by law in our state because often amplification must be tried and then evaluated in order to determine benefit.       There are many excellent choices when it comes to amplification in our area and providers are listed in the phone book under hearing aids, may be affiliated with Ear, Nose and Throat physicians, are located at [facility] and [employer] as well.Marland Kitchen

## 2017-09-27 ENCOUNTER — Ambulatory Visit (INDEPENDENT_AMBULATORY_CARE_PROVIDER_SITE_OTHER): Payer: Medicare Other | Admitting: Physician Assistant

## 2017-09-27 ENCOUNTER — Encounter: Payer: Self-pay | Admitting: Physician Assistant

## 2017-09-27 VITALS — BP 152/80 | HR 85 | Temp 97.6°F | Wt 198.0 lb

## 2017-09-27 DIAGNOSIS — B369 Superficial mycosis, unspecified: Secondary | ICD-10-CM

## 2017-09-27 DIAGNOSIS — H903 Sensorineural hearing loss, bilateral: Secondary | ICD-10-CM

## 2017-09-27 DIAGNOSIS — H6241 Otitis externa in other diseases classified elsewhere, right ear: Secondary | ICD-10-CM

## 2017-09-27 HISTORY — DX: Otitis externa in other diseases classified elsewhere, right ear: B36.9

## 2017-09-27 HISTORY — DX: Otitis externa in other diseases classified elsewhere, right ear: H62.41

## 2017-09-27 NOTE — Progress Notes (Addendum)
HPI:                                                                Tara Campbell is a 71 y.o. female who presents to [name]: Primary Care Sports Medicine today for right ear pain   Otalgia   There is pain in the right ear. This is a new problem. The current episode started in the past 7 days (x 4 days). The problem occurs constantly. The problem has been gradually worsening. There has been no fever. The pain is mild. Associated symptoms include ear discharge (pink) and hearing loss. Pertinent negatives include no coughing or rhinorrhea. Associated symptoms comments: + itchy ear. She has tried ear drops (alcohol) for the symptoms. The treatment provided no relief. Her past medical history is significant for hearing loss (sensorineural).    Past Medical History:  Diagnosis Date  . Allergy   . Asthma   . Colon polyps   . Diabetes mellitus without complication (HCC)   . Gout   . Heart attack (HCC) 07/30/2016   PCI, 2 stents  . Hyperlipidemia   . Hypertension   . Internal hemorrhoids   . Left rotator cuff tear   . Paroxysmal atrial fibrillation (HCC)   . Thyroid disease    Past Surgical History:  Procedure Laterality Date  . ANGIOPLASTY    . COLONOSCOPY W/ POLYPECTOMY    . CORONARY/GRAFT ACUTE MI REVASCULARIZATION    . HEMORRHOID BANDING     Social History  Substance Use Topics  . Smoking status: Never Smoker  . Smokeless tobacco: Never Used  . Alcohol use Yes   family history includes Diabetes in her father and paternal grandmother; Glaucoma in her father; Heart disease in her father; Hyperlipidemia in her father and mother; Hypertension in her father and mother; Skin cancer in her father.  ROS: negative except as noted in the HPI  Medications: Current Outpatient Prescriptions  Medication Sig Dispense Refill  . albuterol (PROVENTIL HFA;VENTOLIN HFA) 108 (90 Base) MCG/ACT inhaler Inhale 2 puffs into the lungs every 6 (six) hours as needed for  wheezing. 2 Inhaler 1  . allopurinol (ZYLOPRIM) 300 MG tablet Take 1 tablet (300 mg total) by mouth 2 (two) times daily. 60 tablet 3  . apixaban (ELIQUIS) 5 MG TABS tablet Take 1 tablet (5 mg total) by mouth 2 (two) times daily. 60 tablet 2  . atenolol (TENORMIN) 100 MG tablet Take 1 tablet (100 mg total) by mouth daily. 90 tablet 0  . atorvastatin (LIPITOR) 80 MG tablet Take 1 tablet (80 mg total) by mouth daily. 90 tablet 3  . Calcium Carbonate-Vitamin D 600-400 MG-UNIT tablet Take 1 tablet by mouth 2 (two) times daily. 60 tablet 11  . clopidogrel (PLAVIX) 75 MG tablet Take 1 tablet (75 mg total) by mouth daily. 90 tablet 0  . fluticasone furoate-vilanterol (BREO ELLIPTA) 100-25 MCG/INH AEPB Inhale 1 puff into the lungs daily. 1 each 2  . irbesartan-hydrochlorothiazide (AVALIDE) 300-12.5 MG tablet Take 1 tablet by mouth daily. 90 tablet 1  . levothyroxine (SYNTHROID, LEVOTHROID) 112 MCG tablet Take 1 tablet (112 mcg total) by mouth daily before breakfast. 30 tablet 2  . metFORMIN (GLUCOPHAGE) 1000 MG tablet Take 1 tablet (1,000 mg total) by mouth 2 (  two) times daily with a meal. 180 tablet 0  . sitaGLIPtin (JANUVIA) 100 MG tablet Take 1 tablet (100 mg total) by mouth daily. Due for follow up visit 90 tablet 0  . tiotropium (SPIRIVA) 18 MCG inhalation capsule Place 1 capsule (18 mcg total) into inhaler and inhale daily. 30 capsule 5   No current facility-administered medications for this visit.    Allergies  Allergen Reactions  . Sulfamethoxazole Rash    HIVES  . Latex   . Sulfa Antibiotics        Objective:  BP (!) 152/80   Pulse 85   Temp 97.6 F (36.4 C) (Oral)   Wt 198 lb (89.8 kg)   BMI 35.07 kg/m  Gen:  alert, not ill-appearing, no distress, appropriate for age HEENT: head normocephalic without obvious abnormality, left TM clear, right external canal and TM with white and black speckled debris, canal is without redness or swelling, conjunctiva and cornea clear, wearing  glasses, trachea midline Pulm: Normal work of breathing, normal phonation Neuro: alert and oriented x 3 MSK: extremities atraumatic, normal gait and station Skin: intact, no rashes on exposed skin, no jaundice, no cyanosis Psych: well-groomed, cooperative, good eye contact, euthymic mood, affect mood-congruent, speech is articulate, and thought processes clear and goal-directed  Dep[facility] screen [facility] 05/29/2017  Decreased Interest 0  Down, Depressed, Hopeless 0  PHQ - 2 Score 0     No results found for this or any previous visit (from the past 72 hour(s)). No results found.    Assessment and Plan: 71 y.o. female with   1. Otomycosis of right ear - same-day appt with [facility] today for debridement  2. Sensorineural hearing loss (SNHL) of both ears - evaluated by audiology, recommended OTC hearing aids  Patient education and anticipatory guidance given Patient agrees with treatment plan Follow-up as needed if symptoms worsen or fail to improve  Levonne Hubertharley E. Jaimie Redditt PA-C

## 2017-09-27 NOTE — Patient Instructions (Addendum)
Check-in at 2:30 at [facility] Dr. Marisa Sprinkles [address] [place], [place] [address] [phone]   Otitis Externa Otitis externa is an infection of the outer ear canal. The outer ear canal is the area between the outside of the ear and the eardrum. Otitis externa is sometimes called "swimmer's ear." What are the causes? This condition may be caused by:  Swimming in dirty water.  Moisture in the ear.  An injury to the inside of the ear.  An object stuck in the ear.  A cut or scrape on the outside of the ear.  What increases the risk? This condition is more likely to develop in swimmers. What are the signs or symptoms? The first symptom of this condition is often itching in the ear. Later signs and symptoms include:  Swelling of the ear.  Redness in the ear.  Ear pain. The pain may get worse when you pull on your ear.  Pus coming from the ear.  How is this diagnosed? This condition may be diagnosed by examining the ear and testing fluid from the ear for bacteria and funguses. How is this treated? This condition may be treated with:  Antibiotic ear drops. These are often given for 10-14 days.  Medicine to reduce itching and swelling.  Follow these instructions at home:  If you were prescribed antibiotic ear drops, apply them as told by your health care provider. Do not stop using the antibiotic even if your condition improves.  Take over-the-counter and prescription medicines only as told by your health care provider.  Keep all follow-up visits as told by your health care provider. This is important. How is this prevented?  Keep your ear dry. Use the corner of a towel to dry your ear after you swim or bathe.  Avoid scratching or putting things in your ear. Doing these things can damage the ear canal or remove the protective wax that lines it, which makes it easier for bacteria and funguses to grow.  Avoid swimming in lakes, polluted water, or pools that may not have  the right amount of chlorine.  Consider making ear drops and putting 3 or 4 drops in each ear after you swim. Ask your health care provider about how you can make ear drops. Contact a health care provider if:  You have a fever.  After 3 days your ear is still red, swollen, painful, or draining pus.  Your redness, swelling, or pain gets worse.  You have a severe headache.  You have redness, swelling, pain, or tenderness in the area behind your ear. This information is not intended to replace advice given to you by your health care provider. Make sure you discuss any questions you have with your health care provider. Document Released: 11/19/2005 Document Revised: 12/27/2015 Document Reviewed: 08/29/2015 Elsevier Interactive Patient Education  [employer].

## 2017-10-16 ENCOUNTER — Encounter: Payer: Self-pay | Admitting: Physician Assistant

## 2017-10-16 ENCOUNTER — Ambulatory Visit (INDEPENDENT_AMBULATORY_CARE_PROVIDER_SITE_OTHER): Payer: Medicare Other | Admitting: Physician Assistant

## 2017-10-16 ENCOUNTER — Ambulatory Visit (INDEPENDENT_AMBULATORY_CARE_PROVIDER_SITE_OTHER): Payer: Medicare Other

## 2017-10-16 VITALS — BP 151/89 | HR 106 | Temp 97.1°F | Wt 199.0 lb

## 2017-10-16 DIAGNOSIS — R05 Cough: Secondary | ICD-10-CM

## 2017-10-16 DIAGNOSIS — J4541 Moderate persistent asthma with (acute) exacerbation: Secondary | ICD-10-CM | POA: Diagnosis not present

## 2017-10-16 DIAGNOSIS — R9431 Abnormal electrocardiogram [ECG] [EKG]: Secondary | ICD-10-CM

## 2017-10-16 DIAGNOSIS — I10 Essential (primary) hypertension: Secondary | ICD-10-CM

## 2017-10-16 MED ORDER — AZITHROMYCIN 250 MG PO TABS: ORAL_TABLET | ORAL | 0 refills | Status: DC

## 2017-10-16 MED ORDER — PREDNISONE 50 MG PO TABS: 50.0000 mg | ORAL_TABLET | Freq: Every day | ORAL | 0 refills | Status: DC

## 2017-10-16 NOTE — Patient Instructions (Addendum)
-   Chest x-ray today - Steroid burst and antibiotic for 5 days - Continue rescue inhaler. Seek emergency care if using rescue inhaler more than 4 times per day - Follow-up in 1 week  Please call [facility] to schedule an appointment tel:[phone]

## 2017-10-16 NOTE — Progress Notes (Addendum)
HPI:                                                                Tara Campbell is a 71 y.o. female who [facility] today for cough  Cough  This is a new problem. The current episode started 1 to 4 weeks ago (x 10 days). The cough is productive of sputum. Associated symptoms include nasal congestion, postnasal drip, shortness of breath and wheezing. Pertinent negatives include no chest pain, ear pain, fever, hemoptysis, myalgias or sore throat. The symptoms are aggravated by exercise. She has tried a beta-agonist inhaler (using Albuterol twice a day) for the symptoms. Her past medical history is significant for asthma.     Past Medical History:  Diagnosis Date  . Allergy   . Asthma   . Colon polyps   . Diabetes mellitus without complication (HCC)   . Gout   . Heart attack (HCC) 07/30/2016   PCI, 2 stents  . Hyperlipidemia   . Hypertension   . Internal hemorrhoids   . Left rotator cuff tear   . Paroxysmal atrial fibrillation (HCC)   . Thyroid disease    Past Surgical History:  Procedure Laterality Date  . ANGIOPLASTY    . COLONOSCOPY W/ POLYPECTOMY    . CORONARY/GRAFT ACUTE MI REVASCULARIZATION    . HEMORRHOID BANDING     Social History   Tobacco Use  . Smoking status: Never Smoker  . Smokeless tobacco: Never Used  Substance Use Topics  . Alcohol use: Yes   family history includes Diabetes in her father and paternal grandmother; Glaucoma in her father; Heart disease in her father; Hyperlipidemia in her father and mother; Hypertension in her father and mother; Skin cancer in her father.  ROS: negative except as noted in the HPI  Medications: Current Outpatient Medications  Medication Sig Dispense Refill  . albuterol (PROVENTIL HFA;VENTOLIN HFA) 108 (90 Base) MCG/ACT inhaler Inhale 2 puffs into the lungs every 6 (six) hours as needed for wheezing. 2 Inhaler 1  . allopurinol (ZYLOPRIM) 300 MG tablet Take  1 tablet (300 mg total) by mouth 2 (two) times daily. 60 tablet 3  . apixaban (ELIQUIS) 5 MG TABS tablet Take 1 tablet (5 mg total) by mouth 2 (two) times daily. 60 tablet 2  . atenolol (TENORMIN) 100 MG tablet Take 1 tablet (100 mg total) by mouth daily. 90 tablet 0  . atorvastatin (LIPITOR) 80 MG tablet Take 1 tablet (80 mg total) by mouth daily. 90 tablet 3  . Calcium Carbonate-Vitamin D 600-400 MG-UNIT tablet Take 1 tablet by mouth 2 (two) times daily. 60 tablet 11  . clopidogrel (PLAVIX) 75 MG tablet Take 1 tablet (75 mg total) by mouth daily. 90 tablet 0  . fluticasone furoate-vilanterol (BREO ELLIPTA) 100-25 MCG/INH AEPB Inhale 1 puff into the lungs daily. 1 each 2  . irbesartan-hydrochlorothiazide (AVALIDE) 300-12.5 MG tablet Take 1 tablet by mouth daily. 90 tablet 1  . levothyroxine (SYNTHROID, LEVOTHROID) 112 MCG tablet Take 1 tablet (112 mcg total) by mouth daily before breakfast. 30 tablet 2  . metFORMIN (GLUCOPHAGE) 1000 MG tablet Take 1 tablet (1,000 mg total) by mouth 2 (two) times daily with a meal. 180 tablet 0  .  sitaGLIPtin (JANUVIA) 100 MG tablet Take 1 tablet (100 mg total) by mouth daily. Due for follow up visit 90 tablet 0  . tiotropium (SPIRIVA) 18 MCG inhalation capsule Place 1 capsule (18 mcg total) into inhaler and inhale daily. 30 capsule 5   No current facility-administered medications for this visit.    Allergies  Allergen Reactions  . Sulfamethoxazole Rash    HIVES  . Latex   . Sulfa Antibiotics        Objective:  BP (!) 178/81   Pulse (!) 106   Temp (!) 97.1 F (36.2 C) (Oral)   Wt 199 lb (90.3 kg)   SpO2 96%   BMI 35.25 kg/m  Gen:  alert, not ill-appearing, no distress, appropriate for age HEENT: head normocephalic without obvious abnormality, conjunctiva and cornea clear, wearing glasses, TM's clear bilaterally, nasal mucosa edematous with rhinorrhea, no sinus tenderness, oropharynx clear, neck supple, no adenopathy, trachea midline Pulm: Normal  work of breathing, normal phonation, clear to auscultation bilaterally, no wheezes, rales or rhonchi CV: tachycardic, irregular rhythm, s1 and s2 distinct, no murmurs, clicks or rubs  Neuro: alert and oriented x 3, no tremor MSK: extremities atraumatic, normal gait and station Skin: intact, no rashes on exposed skin, no jaundice, no cyanosis    No results found for this or any previous visit (from the past 72 hour(s)). No results found.    Assessment and Plan: 71 y.o. female with   1. Moderate persistent asthma with acute exacerbation - SpO2 96% on RA at rest, no evidence of respiratory distress, lungs CTA. Chest x-ray today. Steroid burst. Will cover for respiratory pathogens with Azithromycin - DG Chest 2 View; Future - predniSONE (DELTASONE) 50 MG tablet; Take 1 tablet (50 mg total) daily by mouth.  Dispense: 5 tablet; Refill: 0 - azithromycin (ZITHROMAX Z-PAK) 250 MG tablet; Take 2 tablets (500 mg) on  Day 1,  followed by 1 tablet (250 mg) once daily on Days 2 through 5.  Dispense: 6 tablet; Refill: 0  2. Abnormal resting ECG findings - sinus tachycardia with PAC and aberrant conduction, compared to prior ECG from 01/2017, no AV block, no ST or T wave abnormalities, normal QTc - change likely due to acute illness. Patient also did not take her beta blocker today - she has been referred to cardiology multiple times, but has not scheduled an appointment. Encouraged her to do so today  3. Elevated blood pressure reading BP Readings from Last 3 Encounters:  10/16/17 (!) 151/89  09/27/17 (!) 152/80  08/29/17 137/72  - patient did not have her BP medications this morning. She is also acutely ill - close follow-up in 5 days  Patient education and anticipatory guidance given Patient agrees with treatment plan Follow-up in 5 days or sooner as needed if symptoms worsen or fail to improve  Levonne Hubert PA-C

## 2017-10-16 NOTE — Progress Notes (Signed)
Normal chest x-ray Plan does not change

## 2017-10-21 ENCOUNTER — Encounter: Payer: Self-pay | Admitting: Physician Assistant

## 2017-10-21 ENCOUNTER — Ambulatory Visit (INDEPENDENT_AMBULATORY_CARE_PROVIDER_SITE_OTHER): Payer: Medicare Other | Admitting: Physician Assistant

## 2017-10-21 VITALS — BP 135/77 | HR 80 | Temp 97.5°F | Ht 63.0 in | Wt 196.0 lb

## 2017-10-21 DIAGNOSIS — Z0279 Encounter for issue of other medical certificate: Secondary | ICD-10-CM | POA: Diagnosis not present

## 2017-10-21 DIAGNOSIS — J4540 Moderate persistent asthma, uncomplicated: Secondary | ICD-10-CM | POA: Diagnosis not present

## 2017-10-21 DIAGNOSIS — Z7901 Long term (current) use of anticoagulants: Secondary | ICD-10-CM | POA: Diagnosis not present

## 2017-10-21 DIAGNOSIS — I480 Paroxysmal atrial fibrillation: Secondary | ICD-10-CM | POA: Diagnosis not present

## 2017-10-21 NOTE — Patient Instructions (Addendum)
-   due for A1c in December

## 2017-10-21 NOTE — Progress Notes (Signed)
HPI:                                                                Tara Campbell is a 71 y.o. female who presents to [facility]: [facility] today for follow-up of acute asthma exacerbation  71 yo F with PMH of asthma, HTN, DM II, CAD and Afib presented 5 days ago with 10 days of productive cough, shortness of breath and wheezing. Chest x-ray was negative. She completed Azithromycin and Prednisone. She has not needed to use her rescue inhaler. Reports she is feeling better. She does endorse mildly productive cough and DOE. Denies fever, chills, chest pain, hemoptysis. Requesting handicap parking decal for DOE/asthma.  Past Medical History:  Diagnosis Date  . Allergy   . Asthma   . Colon polyps   . Diabetes mellitus without complication (HCC)   . Gout   . Heart attack (HCC) 07/30/2016   PCI, 2 stents  . Hyperlipidemia   . Hypertension   . Internal hemorrhoids   . Left rotator cuff tear   . Paroxysmal atrial fibrillation (HCC)   . Thyroid disease    Past Surgical History:  Procedure Laterality Date  . ANGIOPLASTY    . COLONOSCOPY W/ POLYPECTOMY    . CORONARY/GRAFT ACUTE MI REVASCULARIZATION    . HEMORRHOID BANDING     Social History   Tobacco Use  . Smoking status: Never Smoker  . Smokeless tobacco: Never Used  Substance Use Topics  . Alcohol use: Yes   family history includes Diabetes in her father and paternal grandmother; Glaucoma in her father; Heart disease in her father; Hyperlipidemia in her father and mother; Hypertension in her father and mother; Skin cancer in her father.  ROS: negative except as noted in the HPI  Medications: Current Outpatient Medications  Medication Sig Dispense Refill  . albuterol (PROVENTIL HFA;VENTOLIN HFA) 108 (90 Base) MCG/ACT inhaler Inhale 2 puffs into the lungs every 6 (six) hours as needed for wheezing. 2 Inhaler 1  . allopurinol (ZYLOPRIM) 300 MG tablet Take 1 tablet (300 mg total) by mouth  2 (two) times daily. 60 tablet 3  . apixaban (ELIQUIS) 5 MG TABS tablet Take 1 tablet (5 mg total) by mouth 2 (two) times daily. 60 tablet 2  . atenolol (TENORMIN) 100 MG tablet Take 1 tablet (100 mg total) by mouth daily. 90 tablet 0  . atorvastatin (LIPITOR) 80 MG tablet Take 1 tablet (80 mg total) by mouth daily. 90 tablet 3  . azithromycin (ZITHROMAX Z-PAK) 250 MG tablet Take 2 tablets (500 mg) on  Day 1,  followed by 1 tablet (250 mg) once daily on Days 2 through 5. 6 tablet 0  . Calcium Carbonate-Vitamin D 600-400 MG-UNIT tablet Take 1 tablet by mouth 2 (two) times daily. 60 tablet 11  . clopidogrel (PLAVIX) 75 MG tablet Take 1 tablet (75 mg total) by mouth daily. 90 tablet 0  . fluticasone furoate-vilanterol (BREO ELLIPTA) 100-25 MCG/INH AEPB Inhale 1 puff into the lungs daily. 1 each 2  . irbesartan-hydrochlorothiazide (AVALIDE) 300-12.5 MG tablet Take 1 tablet by mouth daily. 90 tablet 1  . levothyroxine (SYNTHROID, LEVOTHROID) 112 MCG tablet Take 1 tablet (112 mcg total) by mouth daily before breakfast. 30 tablet 2  .  metFORMIN (GLUCOPHAGE) 1000 MG tablet Take 1 tablet (1,000 mg total) by mouth 2 (two) times daily with a meal. 180 tablet 0  . predniSONE (DELTASONE) 50 MG tablet Take 1 tablet (50 mg total) daily by mouth. 5 tablet 0  . sitaGLIPtin (JANUVIA) 100 MG tablet Take 1 tablet (100 mg total) by mouth daily. Due for follow up visit 90 tablet 0  . tiotropium (SPIRIVA) 18 MCG inhalation capsule Place 1 capsule (18 mcg total) into inhaler and inhale daily. 30 capsule 5   No current facility-administered medications for this visit.    Allergies  Allergen Reactions  . Sulfamethoxazole Rash    HIVES  . Latex   . Sulfa Antibiotics        Objective:  BP 135/77   Pulse 80   Temp (!) 97.5 F (36.4 C) (Oral)   Ht 5\' 3"  (1.6 m)   Wt 196 lb (88.9 kg)   SpO2 95%   BMI 34.72 kg/m  Gen:  alert, not ill-appearing, no distress, appropriate for age, obese female HEENT: head  normocephalic without obvious abnormality, conjunctiva and cornea clear, wearing glasses, trachea midline Pulm: Normal work of breathing, normal phonation, clear to auscultation bilaterally, no wheezes, rales or rhonchi CV: Normal rate, regular rhythm, s1 and s2 distinct, no murmurs, clicks or rubs  Neuro: alert and oriented x 3, no tremor MSK: extremities atraumatic, normal gait and station Skin: intact, no rashes on exposed skin, no jaundice, no cyanosis Psych: well-groomed, cooperative, good eye contact, euthymic mood, affect mood-congruent, speech is articulate, and thought processes clear and goal-directed  Depression screen [facility] 05/29/2017  Decreased Interest 0  Down, Depressed, Hopeless 0  PHQ - 2 Score 0     No results found for this or any previous visit (from the past 72 hour(s)). No results found.    Assessment and Plan: 71 y.o. female with   1. Moderate persistent asthma without complication - vital signs reviewed and normal. Lung sounds CTA today - immunizations UTD - cont Spiriva  2. Paroxysmal atrial fibrillation (HCC) - Ambulatory referral to Cardiology  3. Anticoagulant long-term use - on Eliquis for Afib - Ambulatory referral to Cardiology  4. Encounter for issuance of medical certificate - total and permanent disability placard issued   Patient education and anticipatory guidance given Patient agrees with treatment plan Follow-up as needed if symptoms worsen or fail to improve  Levonne Hubertharley E. Shajuan Musso PA-C

## 2017-10-29 NOTE — Addendum Note (Signed)
Addended by: Collie Siad on: 10/29/2017 04:51 PM   Modules accepted: Orders

## 2017-11-15 ENCOUNTER — Encounter: Payer: Self-pay | Admitting: Physician Assistant

## 2017-11-28 ENCOUNTER — Encounter: Payer: Self-pay | Admitting: Physician Assistant

## 2017-11-28 ENCOUNTER — Ambulatory Visit (INDEPENDENT_AMBULATORY_CARE_PROVIDER_SITE_OTHER): Payer: Medicare Other | Admitting: Physician Assistant

## 2017-11-28 VITALS — BP 133/78 | HR 78 | Temp 97.8°F | Resp 16 | Ht 64.0 in | Wt 200.0 lb

## 2017-11-28 DIAGNOSIS — E1169 Type 2 diabetes mellitus with other specified complication: Secondary | ICD-10-CM | POA: Diagnosis not present

## 2017-11-28 DIAGNOSIS — E782 Mixed hyperlipidemia: Secondary | ICD-10-CM

## 2017-11-28 DIAGNOSIS — Z79899 Other long term (current) drug therapy: Secondary | ICD-10-CM

## 2017-11-28 DIAGNOSIS — E119 Type 2 diabetes mellitus without complications: Secondary | ICD-10-CM | POA: Diagnosis not present

## 2017-11-28 DIAGNOSIS — J4540 Moderate persistent asthma, uncomplicated: Secondary | ICD-10-CM | POA: Diagnosis not present

## 2017-11-28 DIAGNOSIS — I10 Essential (primary) hypertension: Secondary | ICD-10-CM

## 2017-11-28 DIAGNOSIS — M1A09X0 Idiopathic chronic gout, multiple sites, without tophus (tophi): Secondary | ICD-10-CM | POA: Diagnosis not present

## 2017-11-28 NOTE — Progress Notes (Signed)
HPI:                                                                Tara Campbell is a 71 y.o. female who presents to [name]: [facility] today for diabetes and hypertension follow-up  DMII: taking Metformin and Januvia daily. Compliant with medications. Last A1C 6.5, 6 months ago, well controlled. Does not check blood sugars at home. Denies polyuria, vision change, and paresthesias. Denies hypoglycemic events. Denies ulcers/wounds on feet. Eye exam: UTD, no retinopathy Foot exam: UTD Diet: poor Exercise: sedentary   HTN: taking Atenolol and Avalide daily. Compliant with medications. Does not monitor BP at home. Denies vision change, headache, chest pain with exertion, orthopnea, lightheadedness, syncope and edema. Risk factors include: DMII, dyslipidemia, age>55, CAD  CAD: taking Plavix and Atorvastatin. Compliant with medications. Denies myalgias. Denies chest pain or claudication.  Patient reports exercise is mainly limited by her persistent asthma symtpoms. She is currently taking Breo and Spiriva. Continues to endorse dyspnea with exertion. She is using rescue inhaler less than 3 times per week. No recent exacerbations. Last hospitalization for exacerbation was 10 months ago. She is requesting a referral to pulmonology.    Past Medical History:  Diagnosis Date  . Allergy   . Asthma   . Colon polyps   . Diabetes mellitus without complication (HCC)   . Gout   . Heart attack (HCC) 07/30/2016   PCI, 2 stents  . Hyperlipidemia   . Hypertension   . Internal hemorrhoids   . Left rotator cuff tear   . Paroxysmal atrial fibrillation (HCC)   . Thyroid disease    Past Surgical History:  Procedure Laterality Date  . ANGIOPLASTY    . COLONOSCOPY W/ POLYPECTOMY    . CORONARY/GRAFT ACUTE MI REVASCULARIZATION    . HEMORRHOID BANDING     Social History   Tobacco Use  . Smoking status: Never Smoker  . Smokeless tobacco: Never Used   Substance Use Topics  . Alcohol use: Yes   family history includes Diabetes in her father and paternal grandmother; Glaucoma in her father; Heart disease in her father; Hyperlipidemia in her father and mother; Hypertension in her father and mother; Skin cancer in her father.  ROS: negative except as noted in the HPI  Medications: Current Outpatient Medications  Medication Sig Dispense Refill  . albuterol (PROVENTIL HFA;VENTOLIN HFA) 108 (90 Base) MCG/ACT inhaler Inhale 2 puffs into the lungs every 6 (six) hours as needed for wheezing. 2 Inhaler 1  . allopurinol (ZYLOPRIM) 300 MG tablet Take 1 tablet (300 mg total) by mouth 2 (two) times daily. 60 tablet 3  . apixaban (ELIQUIS) 5 MG TABS tablet Take 1 tablet (5 mg total) by mouth 2 (two) times daily. 60 tablet 2  . atenolol (TENORMIN) 100 MG tablet Take 1 tablet (100 mg total) by mouth daily. 90 tablet 0  . atorvastatin (LIPITOR) 80 MG tablet Take 1 tablet (80 mg total) by mouth daily. 90 tablet 3  . Calcium Carbonate-Vitamin D 600-400 MG-UNIT tablet Take 1 tablet by mouth 2 (two) times daily. 60 tablet 11  . clopidogrel (PLAVIX) 75 MG tablet Take 1 tablet (75 mg total) by mouth daily. 90 tablet 0  . fluticasone furoate-vilanterol (BREO  ELLIPTA) 100-25 MCG/INH AEPB Inhale 1 puff into the lungs daily. 1 each 2  . irbesartan-hydrochlorothiazide (AVALIDE) 300-12.5 MG tablet Take 1 tablet by mouth daily. 90 tablet 1  . levothyroxine (SYNTHROID, LEVOTHROID) 112 MCG tablet Take 1 tablet (112 mcg total) by mouth daily before breakfast. 30 tablet 2  . metFORMIN (GLUCOPHAGE) 1000 MG tablet Take 1 tablet (1,000 mg total) by mouth 2 (two) times daily with a meal. 180 tablet 0  . sitaGLIPtin (JANUVIA) 100 MG tablet Take 1 tablet (100 mg total) by mouth daily. Due for follow up visit 90 tablet 0  . tiotropium (SPIRIVA) 18 MCG inhalation capsule Place 1 capsule (18 mcg total) into inhaler and inhale daily. 30 capsule 5   No current facility-administered  medications for this visit.    Allergies  Allergen Reactions  . Sulfamethoxazole Rash    HIVES  . Latex   . Sulfa Antibiotics        Objective:  BP 133/78   Pulse 78   Temp 97.8 F (36.6 C)   Resp 16   Ht 5\' 4"  (1.626 m)   Wt 200 lb (90.7 kg)   SpO2 97%   BMI 34.33 kg/m  Gen:  alert, not ill-appearing, no distress, appropriate for age, obese female HEENT: head normocephalic without obvious abnormality, conjunctiva and cornea clear, trachea midline Pulm: Normal work of breathing, normal phonation, clear to auscultation bilaterally, no wheezes, rales or rhonchi CV: Normal rate, irregular rhythm, s1 and s2 distinct, no murmurs, clicks or rubs  Neuro: alert and oriented x 3, no tremor MSK: extremities atraumatic, normal gait and station Skin: intact, no rashes on exposed skin, no jaundice, no cyanosis Psych: well-groomed, cooperative, good eye contact, euthymic mood, affect mood-congruent, speech is articulate, and thought processes clear and goal-directed  No results found for this or any previous visit (from the past 72 hour(s)). No results found.  Diabetic Foot Exam - Simple   Simple Foot Form Diabetic Foot exam was performed with the following findings:  Yes 11/28/2017  9:36 AM  Visual Inspection No deformities, no ulcerations, no other skin breakdown bilaterally:  Yes Sensation Testing Intact to touch and monofilament testing bilaterally:  Yes Pulse Check Posterior Tibialis and Dorsalis pulse intact bilaterally:  Yes Comments      Assessment and Plan: 71 y.o. female with   1. Controlled type 2 diabetes mellitus without complication, without long-term current use of insulin (HCC) Lab Results  Component Value Date   HGBA1C 6.5 (H) 05/29/2017  - BP at goal - on ARB - on high-intensity statin - normal foot exam, no neuropathy - continue Metformin - I would like to switch her to Jardiance for cardiovascular benefit - Hemoglobin A1c - BASIC METABOLIC PANEL  WITH GFR - metFORMIN (GLUCOPHAGE) 1000 MG tablet; Take 1 tablet (1,000 mg total) by mouth 2 (two) times daily with a meal.  Dispense: 180 tablet; Refill: 1  2. Hypertension goal BP (blood pressure) < 140/90 BP Readings from Last 3 Encounters:  11/28/17 133/78  10/21/17 135/77  10/16/17 (!) 151/89  - BP in range - cont daily medications   3. Mixed diabetic hyperlipidemia associated with type 2 diabetes mellitus (HCC) LDL goal <70 - last LDL 109, 3 months ago. Atorvastatin was increased to 80 mg. Plan to recheck fasting lipids in 3 months  4. Moderate persistent asthma without complication - she is already step 5 therapy with Spiriva. I think pulmonology referral is reasonable - continue daily medications - immunizations UTD - Ambulatory  referral to Pulmonology  5. Idiopathic chronic gout 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 - uric acid goal <6. Sports Medicine wanted to recheck. Patient is taking Allopurinol. No recent flares - Uric acid  6. Encounter for long-term (current) use of medications - Uric acid - Hemoglobin A1c - BASIC METABOLIC PANEL WITH GFR   Patient education and anticipatory guidance given Patient agrees with treatment plan Follow-up in 6 months or sooner as needed if symptoms worsen or fail to improve  Levonne Hubert PA-C

## 2017-11-28 NOTE — Patient Instructions (Addendum)
- limit carbohydrates to 30g at meal time and 15g for snacks - limit calories to 1400-1450 per day   Diabetes Mellitus and Nutrition When you have diabetes (diabetes mellitus), it is very important to have healthy eating habits because your blood sugar (glucose) levels are greatly affected by what you eat and drink. Eating healthy foods in the appropriate amounts, at about the same times every day, can help you:  Control your blood glucose.  Lower your risk of heart disease.  Improve your blood pressure.  Reach or maintain a healthy weight.  Every person with diabetes is different, and each person has different needs for a meal plan. Your health care provider may recommend that you work with a diet and nutrition specialist (dietitian) to make a meal plan that is best for you. Your meal plan may vary depending on factors such as:  The calories you need.  The medicines you take.  Your weight.  Your blood glucose, blood pressure, and cholesterol levels.  Your activity level.  Other health conditions you have, such as heart or kidney disease.  How do carbohydrates affect me? Carbohydrates affect your blood glucose level more than any other type of food. Eating carbohydrates naturally increases the amount of glucose in your blood. Carbohydrate counting is a method for keeping track of how many carbohydrates you eat. Counting carbohydrates is important to keep your blood glucose at a healthy level, especially if you use insulin or take certain oral diabetes medicines. It is important to know how many carbohydrates you can safely have in each meal. This is different for every person. Your dietitian can help you calculate how many carbohydrates you should have at each meal and for snack. Foods that contain carbohydrates include:  Bread, cereal, rice, pasta, and crackers.  Potatoes and corn.  Peas, beans, and lentils.  Milk and yogurt.  Fruit and juice.  Desserts, such as cakes,  cookies, ice cream, and candy.  How does alcohol affect me? Alcohol can cause a sudden decrease in blood glucose (hypoglycemia), especially if you use insulin or take certain oral diabetes medicines. Hypoglycemia can be a life-threatening condition. Symptoms of hypoglycemia (sleepiness, dizziness, and confusion) are similar to symptoms of having too much alcohol. If your health care provider says that alcohol is safe for you, follow these guidelines:  Limit alcohol intake to no more than 1 drink per day for nonpregnant women and 2 drinks per day for men. One drink equals 12 oz of beer, 5 oz of wine, or 1 oz of hard liquor.  Do not drink on an empty stomach.  Keep yourself hydrated with water, diet soda, or unsweetened iced tea.  Keep in mind that regular soda, juice, and other mixers may contain a lot of sugar and must be counted as carbohydrates.  What are tips for following this plan? Reading food labels  Start by checking the serving size on the label. The amount of calories, carbohydrates, fats, and other nutrients listed on the label are based on one serving of the food. Many foods contain more than one serving per package.  Check the total grams (g) of carbohydrates in one serving. You can calculate the number of servings of carbohydrates in one serving by dividing the total carbohydrates by 15. For example, if a food has 30 g of total carbohydrates, it would be equal to 2 servings of carbohydrates.  Check the number of grams (g) of saturated and trans fats in one serving. Choose foods that have  low or no amount of these fats.  Check the number of milligrams (mg) of sodium in one serving. Most people should limit total sodium intake to less than 2,300 mg per day.  Always check the nutrition information of foods labeled as "low-fat" or "nonfat". These foods may be higher in added sugar or refined carbohydrates and should be avoided.  Talk to your dietitian to identify your daily  goals for nutrients listed on the label. Shopping  Avoid buying canned, premade, or processed foods. These foods tend to be high in fat, sodium, and added sugar.  Shop around the outside edge of the grocery store. This includes fresh fruits and vegetables, bulk grains, fresh meats, and fresh dairy. Cooking  Use low-heat cooking methods, such as baking, instead of high-heat cooking methods like deep frying.  Cook using healthy oils, such as olive, canola, or sunflower oil.  Avoid cooking with butter, cream, or high-fat meats. Meal planning  Eat meals and snacks regularly, preferably at the same times every day. Avoid going long periods of time without eating.  Eat foods high in fiber, such as fresh fruits, vegetables, beans, and whole grains. Talk to your dietitian about how many servings of carbohydrates you can eat at each meal.  Eat 4-6 ounces of lean protein each day, such as lean meat, chicken, fish, eggs, or tofu. 1 ounce is equal to 1 ounce of meat, chicken, or fish, 1 egg, or 1/4 cup of tofu.  Eat some foods each day that contain healthy fats, such as avocado, nuts, seeds, and fish. Lifestyle   Check your blood glucose regularly.  Exercise at least 30 minutes 5 or more days each week, or as told by your health care provider.  Take medicines as told by your health care provider.  Do not use any products that contain nicotine or tobacco, such as cigarettes and e-cigarettes. If you need help quitting, ask your health care provider.  Work with a Veterinary surgeon or diabetes educator to identify strategies to manage stress and any emotional and social challenges. What are some questions to ask my health care provider?  Do I need to meet with a diabetes educator?  Do I need to meet with a dietitian?  What number can I call if I have questions?  When are the best times to check my blood glucose? Where to find more information:  American Diabetes Association:  diabetes.org/food-and-fitness/food  Academy of Nutrition and Dietetics: https://www.vargas.com/  [employer] of Diabetes and Digestive and Kidney Diseases (NIH): FindJewelers.cz Summary  A healthy meal plan will help you control your blood glucose and maintain a healthy lifestyle.  Working with a diet and nutrition specialist (dietitian) can help you make a meal plan that is best for you.  Keep in mind that carbohydrates and alcohol have immediate effects on your blood glucose levels. It is important to count carbohydrates and to use alcohol carefully. This information is not intended to replace advice given to you by your health care provider. Make sure you discuss any questions you have with your health care provider. Document Released: 08/16/2005 Document Revised: 12/24/2016 Document Reviewed: 12/24/2016 Elsevier Interactive Patient Education  [employer].

## 2017-11-29 ENCOUNTER — Other Ambulatory Visit: Payer: Self-pay | Admitting: Physician Assistant

## 2017-11-29 ENCOUNTER — Telehealth: Payer: Self-pay

## 2017-11-29 DIAGNOSIS — E119 Type 2 diabetes mellitus without complications: Secondary | ICD-10-CM

## 2017-11-29 LAB — BASIC METABOLIC PANEL WITH GFR
BUN: 11 mg/dL (ref 7–25)
CO2: 28 mmol/L (ref 20–32)
Calcium: 10.3 mg/dL (ref 8.6–10.4)
Chloride: 102 mmol/L (ref 98–110)
Creat: 0.92 mg/dL (ref 0.60–0.93)
GFR, Est African American: 72 mL/min/{1.73_m2} (ref >=60)
GFR, Est Non African American: 62 mL/min/{1.73_m2} (ref >=60)
GLUCOSE: 191 mg/dL — AB (ref 65–99)
POTASSIUM: 4 mmol/L (ref 3.5–5.3)
SODIUM: 137 mmol/L (ref 135–146)

## 2017-11-29 LAB — HEMOGLOBIN A1C
EAG (MMOL/L): 10.6 (calc)
HEMOGLOBIN A1C: 8.3 %{Hb} — AB (ref <5.7)
MEAN PLASMA GLUCOSE: 192 (calc)

## 2017-11-29 LAB — URIC ACID: Uric Acid, Serum: 6.9 mg/dL (ref 2.5–7.0)

## 2017-11-29 MED ORDER — METFORMIN HCL 1000 MG PO TABS: 1000.0000 mg | ORAL_TABLET | Freq: Two times a day (BID) | ORAL | 0 refills | Status: DC

## 2017-11-29 MED ORDER — SITAGLIPTIN PHOSPHATE 100 MG PO TABS: 100.0000 mg | ORAL_TABLET | Freq: Every day | ORAL | 0 refills | Status: DC

## 2017-11-29 MED ORDER — EMPAGLIFLOZIN 25 MG PO TABS: 25.0000 mg | ORAL_TABLET | Freq: Every day | ORAL | 0 refills | Status: DC

## 2017-11-29 NOTE — Telephone Encounter (Signed)
Per provider disregard message.

## 2017-11-29 NOTE — Progress Notes (Signed)
A1c is increased at 8.3. Goal is less than 7 I am adding a medicine called Jardiance. Continue Metformin 1000 mg twice a day with meals and Januvia. All 3 prescriptions have been sent to [facility] Work on diet and weight loss Follow-up in 3 months for diabetes

## 2017-11-29 NOTE — Telephone Encounter (Signed)
-----   Message from [facility] [place] sent at 11/28/2017 12:59 PM EST ----- Can you find out what pharmacy Deztini would like her diabetes medications sent to? Also, I would like to switch her from [place] to [place]. [place] is very good for people with both diabetes and heart disease. Please let her know about this medication change. Thanks!

## 2017-12-04 ENCOUNTER — Other Ambulatory Visit: Payer: Self-pay | Admitting: Physician Assistant

## 2017-12-04 DIAGNOSIS — I252 Old myocardial infarction: Secondary | ICD-10-CM

## 2017-12-04 DIAGNOSIS — I2510 Atherosclerotic heart disease of native coronary artery without angina pectoris: Secondary | ICD-10-CM

## 2017-12-04 DIAGNOSIS — I480 Paroxysmal atrial fibrillation: Secondary | ICD-10-CM

## 2017-12-05 NOTE — Telephone Encounter (Signed)
Records states pt is taking Eliquis and Plavix. Please advise.

## 2017-12-10 LAB — HM DIABETES EYE EXAM

## 2017-12-12 ENCOUNTER — Encounter: Payer: Self-pay | Admitting: Physician Assistant

## 2017-12-12 DIAGNOSIS — H409 Unspecified glaucoma: Secondary | ICD-10-CM

## 2017-12-12 HISTORY — DX: Unspecified glaucoma: H40.9

## 2017-12-17 ENCOUNTER — Encounter: Payer: Self-pay | Admitting: Physician Assistant

## 2017-12-26 ENCOUNTER — Encounter: Payer: Self-pay | Admitting: Pulmonary Disease

## 2017-12-26 ENCOUNTER — Institutional Professional Consult (permissible substitution): Payer: Medicare Other | Admitting: Pulmonary Disease

## 2017-12-26 ENCOUNTER — Ambulatory Visit (INDEPENDENT_AMBULATORY_CARE_PROVIDER_SITE_OTHER): Payer: Medicare HMO | Admitting: Pulmonary Disease

## 2017-12-26 VITALS — BP 92/60 | HR 88 | Ht 64.0 in | Wt 193.4 lb

## 2017-12-26 DIAGNOSIS — J45909 Unspecified asthma, uncomplicated: Secondary | ICD-10-CM

## 2017-12-26 DIAGNOSIS — J4550 Severe persistent asthma, uncomplicated: Secondary | ICD-10-CM

## 2017-12-26 DIAGNOSIS — I480 Paroxysmal atrial fibrillation: Secondary | ICD-10-CM

## 2017-12-26 NOTE — Progress Notes (Deleted)
   Subjective:    Patient ID: Tara Campbell, female    DOB: [birth year], 72 y.o.   MRN: [id number]  HPI    Review of Systems  Constitutional: Negative for fever and unexpected weight change.  HENT: Positive for dental problem. Negative for congestion, ear pain, nosebleeds, postnasal drip, rhinorrhea, sinus pressure, sneezing, sore throat and trouble swallowing.   Eyes: Negative for redness and itching.  Respiratory: Positive for cough, shortness of breath and wheezing. Negative for chest tightness.   Cardiovascular: Negative for palpitations and leg swelling.  Gastrointestinal: Negative for nausea and vomiting.  Genitourinary: Negative for dysuria.  Musculoskeletal: Negative for joint swelling.  Skin: Negative for rash.  Allergic/Immunologic: Negative.  Negative for environmental allergies, food allergies and immunocompromised state.  Neurological: Negative for headaches.  Hematological: Does not bruise/bleed easily.  Psychiatric/Behavioral: Negative for dysphoric mood. The patient is not nervous/anxious.        Objective:   Physical Exam        Assessment & Plan:

## 2017-12-26 NOTE — Progress Notes (Signed)
Subjective:   PATIENT ID: Tara Campbell GENDER: female DOB: 1946/09/07, MRN: [id number]   Synopsis: Referred in January 2019 for asthma  HPI  Chief Complaint  Patient presents with  . pulm consult    Pt referred by Dr. [name] PA for asthma. Pt has SOB with exertion, wheezing and dry cough.   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 She did not need mechanical ventilation > she is taking Breo and Spiriva > her last flare of asthma was in the fall, she needed prednisone (late September) > she only needs albuterol rarely, some months never needs it, doesn't wake up with asthma symptoms at night > her asthma is worse with fast walking or emotional stress.> she will get tightness in her back and her heart races.    She also has Afib > she sees Olga Millers for afib  Dyspnea:  > she does walk her sister's dog about 1/2 mile 3-4 times a week > she may have dyspnea when she climbs a hill     Past Medical History:  Diagnosis Date  . Allergy   . Asthma   . Colon polyps   . Diabetes mellitus without complication (HCC)   . Gout   . Heart attack (HCC) 07/30/2016   PCI, 2 stents  . Hyperlipidemia   . Hypertension   . Internal hemorrhoids   . Left rotator cuff tear   . 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 Otomycosis of right ear  09/27/2017  . Paroxysmal atrial fibrillation (HCC)   . Thyroid disease      Family History  Problem Relation Age of Onset  . Hypertension Mother   . Hyperlipidemia Mother   . Glaucoma Father   . Heart disease Father   . Hypertension Father   . Diabetes Father   . Hyperlipidemia Father   . Skin cancer Father   . Diabetes Paternal Grandmother      Social History   Socioeconomic History  . Marital status: Divorced    Spouse name: Not on file  . Number of children: Not on file  . Years of education: Not on file  . Highest education level: Not on file  Social Needs  . Financial resource strain: Not on file  . Food insecurity - worry: Not on file  . Food insecurity - inability: Not on file  . Transportation needs - medical: Not on file  . Transportation needs - non-medical: Not on file  Occupational History  . Not on file  Tobacco Use  . Smoking status: Never Smoker  . Smokeless tobacco: Never Used  Substance and Sexual Activity  . Alcohol use: Yes  . Drug use: No  . Sexual activity: Not Currently  Other Topics Concern  . Not on file  Social History Narrative  .  Not on file     Allergies  Allergen Reactions  . Sulfamethoxazole Rash    HIVES  . Latex   . Sulfa Antibiotics      Outpatient Medications Prior to Visit  Medication Sig Dispense Refill  . albuterol (PROVENTIL HFA;VENTOLIN HFA) 108 (90 Base) MCG/ACT inhaler Inhale 2 puffs into the lungs every 6 (six) hours as needed for wheezing. 2 Inhaler 1  . allopurinol (ZYLOPRIM) 300 MG tablet Take 1 tablet (300 mg total) by mouth 2 (two) times daily. 60 tablet 3  . apixaban (ELIQUIS) 5 MG TABS tablet Take 1 tablet (5 mg total) by mouth 2 (two) times daily. 60 tablet 2  . atenolol (TENORMIN) 100 MG tablet TAKE 1 TABLET BY MOUTH  DAILY 90 tablet 1  . atorvastatin (LIPITOR) 80 MG tablet Take 1 tablet (80 mg total) by mouth daily. 90 tablet 3  . Calcium Carbonate-Vitamin D 600-400 MG-UNIT tablet Take 1 tablet by mouth  2 (two) times daily. 60 tablet 11  . clopidogrel (PLAVIX) 75 MG tablet TAKE 1 TABLET BY MOUTH  DAILY 90 tablet 1  . empagliflozin (JARDIANCE) 25 MG TABS tablet Take 25 mg by mouth daily. 90 tablet 0  . fluticasone furoate-vilanterol (BREO ELLIPTA) 100-25 MCG/INH AEPB Inhale 1 puff into the lungs daily. 1 each 2  . irbesartan-hydrochlorothiazide (AVALIDE) 300-12.5 MG tablet Take 1 tablet by mouth daily. 90 tablet 1  . levothyroxine (SYNTHROID, LEVOTHROID) 112 MCG tablet Take 1 tablet (112 mcg total) by mouth daily before breakfast. 30 tablet 2  . metFORMIN (GLUCOPHAGE) 1000 MG tablet Take 1 tablet (1,000 mg total) by mouth 2 (two) times daily with a meal. 180 tablet 0  . sitaGLIPtin (JANUVIA) 100 MG tablet Take 1 tablet (100 mg total) by mouth daily. 90 tablet 0  . tiotropium (SPIRIVA) 18 MCG inhalation capsule Place 1 capsule (18 mcg total) into inhaler and inhale daily. 30 capsule 5   No facility-administered medications prior to visit.     Review of Systems  Constitutional: Negative for chills, fever, malaise/fatigue and weight loss.  HENT: Negative for congestion, nosebleeds, sinus pain and sore throat.   Eyes: Negative for photophobia, pain and discharge.  Respiratory: Negative for cough, hemoptysis, sputum production, shortness of breath and wheezing.   Cardiovascular: Negative for chest pain, palpitations, orthopnea and leg swelling.  Gastrointestinal: Negative for abdominal pain, constipation, diarrhea, nausea and vomiting.  Genitourinary: Negative for dysuria, frequency, hematuria and urgency.  Musculoskeletal: Negative for back pain, joint pain, myalgias and neck pain.  Skin: Negative for itching and rash.  Neurological: Negative for tingling, tremors, sensory change, speech change, focal weakness, seizures, weakness and headaches.  Psychiatric/Behavioral: Negative for memory loss, substance abuse and suicidal ideas. The patient is not nervous/anxious.       Objective:    Physical Exam   Vitals:   12/26/17 1552  BP: 92/60  Pulse: 88  SpO2: 96%  Weight: 193 lb 6.4 oz (87.7 kg)  Height: 5\' 4"  (1.626 m)    Gen: well appearing, no acute distress HENT: NCAT, OP clear, neck supple without masses Eyes: PERRL, EOMi Lymph: no cervical lymphadenopathy PULM: CTA B CV: RRR, no mgr, no JVD GI: BS+, soft, nontender, no hsm Derm: no rash or skin breakdown MSK: normal bulk and tone Neuro: A&Ox4, CN II-XII intact, strength 5/5 in all 4 extremities Psyche: normal mood and affect   CBC    Component Value Date/Time   WBC 8.2 05/29/2017 0909   RBC 4.49 05/29/2017 0909  HGB 12.8 05/29/2017 0909   HCT 38.6 05/29/2017 0909   PLT 396 05/29/2017 0909   MCV 86.0 05/29/2017 0909   MCH 28.5 05/29/2017 0909   MCHC 33.2 05/29/2017 0909   RDW 13.9 05/29/2017 0909     Chest imaging:  PFT:  Labs:  Path:  Echo:  Heart Catheterization:  Records from her visit with [name] reviewed where she was noted to be complaining of dyspnea on exertion despite taking Breo and Spiriva.       Assessment & Plan:   No diagnosis found.  Discussion: Cella has symptoms consistent with asthma and has been hospitalized for exacerbation of the same in the.  Based on the frequency of her exacerbations and the severe hospitalization a year ago I do consider her to have severe persistent asthma.  However, she has very few symptoms in between episodes of flareups.  So I think it is reasonable to consider backing off on her therapy by stopping Spiriva.  Prior to doing this though I like to see results of lung function testing and blood work to make sure she does not have evidence of allergic or eosinophilic asthma.  She would like to start exercising more frequently, though she feels somewhat limited by her prior rotator cuff tear.  I will recommend that she see a physical therapist in town for this.  Plan: Severe persistent asthma: For now continue Breo and  Spiriva We will get a lung function test and some blood work to see how severe your asthma is If the blood test and the lung function test are okay then we will likely stop Spiriva Use albuterol as needed for chest tightness wheezing and shortness of breath, when you run out we can change this to Xopenex  Atrial fibrillation: Today you seem to be in sinus rhythm Follow-up with Dr. Jens Som  Rotator cuff tear: I recommend that you talk to Micheline Chapman, a physical therapist in town, and his number is [phone]  We will see you back in 2-4 weeks to review the results of this testing      Current Outpatient Medications:  .  albuterol (PROVENTIL HFA;VENTOLIN HFA) 108 (90 Base) MCG/ACT inhaler, Inhale 2 puffs into the lungs every 6 (six) hours as needed for wheezing., Disp: 2 Inhaler, Rfl: 1 .  allopurinol (ZYLOPRIM) 300 MG tablet, Take 1 tablet (300 mg total) by mouth 2 (two) times daily., Disp: 60 tablet, Rfl: 3 .  apixaban (ELIQUIS) 5 MG TABS tablet, Take 1 tablet (5 mg total) by mouth 2 (two) times daily., Disp: 60 tablet, Rfl: 2 .  atenolol (TENORMIN) 100 MG tablet, TAKE 1 TABLET BY MOUTH  DAILY, Disp: 90 tablet, Rfl: 1 .  atorvastatin (LIPITOR) 80 MG tablet, Take 1 tablet (80 mg total) by mouth daily., Disp: 90 tablet, Rfl: 3 .  Calcium Carbonate-Vitamin D 600-400 MG-UNIT tablet, Take 1 tablet by mouth 2 (two) times daily., Disp: 60 tablet, Rfl: 11 .  clopidogrel (PLAVIX) 75 MG tablet, TAKE 1 TABLET BY MOUTH  DAILY, Disp: 90 tablet, Rfl: 1 .  empagliflozin (JARDIANCE) 25 MG TABS tablet, Take 25 mg by mouth daily., Disp: 90 tablet, Rfl: 0 .  fluticasone furoate-vilanterol (BREO ELLIPTA) 100-25 MCG/INH AEPB, Inhale 1 puff into the lungs daily., Disp: 1 each, Rfl: 2 .  irbesartan-hydrochlorothiazide (AVALIDE) 300-12.5 MG tablet, Take 1 tablet by mouth daily., Disp: 90 tablet, Rfl: 1 .  levothyroxine (SYNTHROID, LEVOTHROID) 112 MCG tablet, Take 1 tablet (112 mcg total) by mouth daily  before breakfast., Disp: 30 tablet, Rfl: 2 .  metFORMIN (GLUCOPHAGE) 1000 MG tablet, Take 1 tablet (1,000 mg total) by mouth 2 (two) times daily with a meal., Disp: 180 tablet, Rfl: 0 .  sitaGLIPtin (JANUVIA) 100 MG tablet, Take 1 tablet (100 mg total) by mouth daily., Disp: 90 tablet, Rfl: 0 .  tiotropium (SPIRIVA) 18 MCG inhalation capsule, Place 1 capsule (18 mcg total) into inhaler and inhale daily., Disp: 30 capsule, Rfl: 5

## 2017-12-26 NOTE — Patient Instructions (Signed)
Severe persistent asthma: For now continue Breo and Spiriva We will get a lung function test and some blood work to see how severe your asthma is If the blood test and the lung function test are okay then we will likely stop Spiriva Use albuterol as needed for chest tightness wheezing and shortness of breath, when you run out we can change this to Xopenex  Atrial fibrillation: Today you seem to be in sinus rhythm Follow-up with Dr. Jens Som  Rotator cuff tear: I recommend that you talk to Micheline Chapman, a physical therapist in town, and his number is [phone]  We will see you back in 2-4 weeks to review the results of this testing

## 2017-12-27 ENCOUNTER — Telehealth: Payer: Self-pay | Admitting: Physician Assistant

## 2017-12-27 ENCOUNTER — Ambulatory Visit (INDEPENDENT_AMBULATORY_CARE_PROVIDER_SITE_OTHER): Payer: Medicare HMO | Admitting: Physician Assistant

## 2017-12-27 ENCOUNTER — Encounter: Payer: Self-pay | Admitting: Physician Assistant

## 2017-12-27 VITALS — BP 135/74 | HR 84 | Wt 192.0 lb

## 2017-12-27 DIAGNOSIS — E1165 Type 2 diabetes mellitus with hyperglycemia: Secondary | ICD-10-CM

## 2017-12-27 DIAGNOSIS — E118 Type 2 diabetes mellitus with unspecified complications: Secondary | ICD-10-CM | POA: Insufficient documentation

## 2017-12-27 DIAGNOSIS — Z79899 Other long term (current) drug therapy: Secondary | ICD-10-CM | POA: Diagnosis not present

## 2017-12-27 DIAGNOSIS — E6609 Other obesity due to excess calories: Secondary | ICD-10-CM | POA: Diagnosis not present

## 2017-12-27 NOTE — Progress Notes (Signed)
HPI:                                                                Tara Campbell is a 72 y.o. female who presents to [name] today for medication issue  Patient reports she recently switched her insurance to [facility] and she had a concern about affording her diabetes medication. She has since clarified things with her insurance and no longer believes this is an issue.   Depression screen [facility] 2/9 05/29/2017  Decreased Interest 0  Down, Depressed, Hopeless 0  PHQ - 2 Score 0    No flowsheet data found.    Past Medical History:  Diagnosis Date  . Allergy   . Asthma   . Colon polyps   . Diabetes mellitus without complication (HCC)   . Gout   . Heart attack (HCC) 07/30/2016   PCI, 2 stents  . Hyperlipidemia   . Hypertension   . Internal hemorrhoids   . Left rotator cuff tear   . 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 Otomycosis of right ear 09/27/2017  . Paroxysmal atrial fibrillation (HCC)   . Thyroid disease    Past Surgical History:  Procedure Laterality Date  . ANGIOPLASTY    . COLONOSCOPY W/ POLYPECTOMY    . CORONARY/GRAFT ACUTE MI REVASCULARIZATION    . HEMORRHOID BANDING     Social History   Tobacco Use  . Smoking status: Never Smoker  . Smokeless tobacco: Never Used  Substance Use Topics  . Alcohol use: Yes   family history includes Diabetes in her father and paternal grandmother; Glaucoma in her father; Heart disease in her father; Hyperlipidemia in her father and mother; Hypertension in her father and mother; Skin cancer in her father.    ROS: negative except as noted in the HPI  Medications: Current Outpatient Medications  Medication Sig Dispense Refill  . albuterol (PROVENTIL HFA;VENTOLIN HFA) 108 (90 Base) MCG/ACT inhaler Inhale 2 puffs into the lungs every 6 (six) hours as needed for wheezing. 2 Inhaler 1  . allopurinol (ZYLOPRIM) 300 MG tablet Take 1 tablet (300 mg total) by mouth 2 (two) times  daily. 60 tablet 3  . apixaban (ELIQUIS) 5 MG TABS tablet Take 1 tablet (5 mg total) by mouth 2 (two) times daily. 60 tablet 2  . atenolol (TENORMIN) 100 MG tablet TAKE 1 TABLET BY MOUTH  DAILY 90 tablet 1  . atorvastatin (LIPITOR) 80 MG tablet Take 1 tablet (80 mg total) by mouth daily. 90 tablet 3  . Calcium Carbonate-Vitamin D 600-400 MG-UNIT tablet Take 1 tablet by mouth 2 (two) times daily. 60 tablet 11  . clopidogrel (PLAVIX) 75 MG tablet TAKE 1 TABLET BY MOUTH  DAILY 90 tablet 1  . empagliflozin (JARDIANCE) 25 MG TABS tablet Take 25 mg by mouth daily. 90 tablet 0  . fluticasone furoate-vilanterol (BREO ELLIPTA) 100-25 MCG/INH AEPB Inhale 1 puff into the lungs daily. 1 each 2  . irbesartan-hydrochlorothiazide (AVALIDE) 300-12.5 MG tablet Take 1 tablet by mouth daily. 90 tablet 1  . levothyroxine (SYNTHROID, LEVOTHROID) 112 MCG tablet Take 1 tablet (112 mcg total) by mouth daily before breakfast. 30 tablet 2  . metFORMIN (GLUCOPHAGE) 1000 MG tablet Take 1 tablet (1,000 mg total) by  mouth 2 (two) times daily with a meal. 180 tablet 0  . sitaGLIPtin (JANUVIA) 100 MG tablet Take 1 tablet (100 mg total) by mouth daily. 90 tablet 0  . tiotropium (SPIRIVA) 18 MCG inhalation capsule Place 1 capsule (18 mcg total) into inhaler and inhale daily. 30 capsule 5   No current facility-administered medications for this visit.    Allergies  Allergen Reactions  . Sulfamethoxazole Rash    HIVES  . Latex   . Sulfa Antibiotics        Objective:  BP 135/74   Pulse 84   Wt 192 lb (87.1 kg)   BMI 32.96 kg/m  Gen:  alert, not ill-appearing, no distress, appropriate for age HEENT: head normocephalic without obvious abnormality, conjunctiva and cornea clear, trachea midline Pulm: Normal work of breathing, normal phonation Neuro: alert and oriented x 3, no tremor MSK: extremities atraumatic, normal gait and station Skin: intact, no rashes on exposed skin, no jaundice, no cyanosis Psych:  well-groomed, cooperative, good eye contact, euthymic mood, affect mood-congruent, speech is articulate, and thought processes clear and goal-directed    Results for orders placed or performed in visit on 12/26/17 (from the past 72 hour(s))  CBC With Differential     Status: Abnormal   Collection Time: 12/26/17  4:30 PM  Result Value Ref Range   WBC 11.9 (H) 3.4 - 10.8 x10E3/uL   RBC 4.32 3.77 - 5.28 x10E6/uL   Hemoglobin 12.8 11.1 - 15.9 g/dL   Hematocrit 16.1 09.6 - 46.6 %   MCV 86 79 - 97 fL   MCH 29.6 26.6 - 33.0 pg   MCHC 34.3 31.5 - 35.7 g/dL   RDW 04.5 40.9 - 81.1 %   Neutrophils 65 Not Estab. %   Lymphs 15 Not Estab. %   Monocytes 11 Not Estab. %   Eos 9 Not Estab. %   Basos 0 Not Estab. %   Neutrophils Absolute 7.7 (H) 1.4 - 7.0 x10E3/uL   Lymphocytes Absolute 1.8 0.7 - 3.1 x10E3/uL   Monocytes Absolute 1.3 (H) 0.1 - 0.9 x10E3/uL   EOS (ABSOLUTE) 1.0 (H) 0.0 - 0.4 x10E3/uL   Basophils Absolute 0.1 0.0 - 0.2 x10E3/uL   Immature Granulocytes 0 Not Estab. %   Immature Grans (Abs) 0.0 0.0 - 0.1 x10E3/uL  IgE     Status: None (Preliminary result)   Collection Time: 12/26/17  4:30 PM  Result Value Ref Range   IgE (Immunoglobulin E), Serum WILL FOLLOW    No results found.    Assessment and Plan: 72 y.o. female with   1. Uncontrolled type 2 diabetes mellitus with hyperglycemia (HCC) Lab Results  Component Value Date   HGBA1C 8.3 (H) 11/28/2017  - patient reports she has never been to a diabetes educator and is interested in getting help with diet - Ambulatory referral to diabetic education  2. Medication management - plan to continue Januvia, Jardiance and Metformin. Will possibly switch to Synjardy if it will save the patient a co-pay with [employer] - patients questions answered today. Explained in the future she can call our office and does not need an appointment for an insurance change  Patient education and anticipatory guidance given Patient agrees with  treatment plan Follow-up in 3 months for diabetes as needed if symptoms worsen or fail to improve  Levonne Hubert PA-C

## 2017-12-30 NOTE — Progress Notes (Signed)
HPI: 72 year old female for evaluation of atrial fibrillation at request of [place]  MD.  Patient has a history of coronary artery disease with PCI in August 2017.  Patient apparently was admitted to [facility] in February 2018 with asthma exacerbation/flu and atrial fibrillation associated with these conditions and Sudafed.  Patient presents to establish.  She has mild dyspnea on exertion but no orthopnea, PND, pedal edema, chest pain, palpitations or syncope.  No bleeding.  Current Outpatient Medications  Medication Sig Dispense Refill  . albuterol (PROVENTIL HFA;VENTOLIN HFA) 108 (90 Base) MCG/ACT inhaler Inhale 2 puffs into the lungs every 6 (six) hours as needed for wheezing. 2 Inhaler 1  . allopurinol (ZYLOPRIM) 300 MG tablet Take 1 tablet (300 mg total) by mouth 2 (two) times daily. 60 tablet 3  . apixaban (ELIQUIS) 5 MG TABS tablet Take 1 tablet (5 mg total) by mouth 2 (two) times daily. 60 tablet 2  . atenolol (TENORMIN) 100 MG tablet TAKE 1 TABLET BY MOUTH  DAILY 90 tablet 1  . atorvastatin (LIPITOR) 80 MG tablet Take 1 tablet (80 mg total) by mouth daily. 90 tablet 3  . Cholecalciferol (VITAMIN D3 PO) Take 5,000 mg by mouth daily.    . clopidogrel (PLAVIX) 75 MG tablet TAKE 1 TABLET BY MOUTH  DAILY 90 tablet 1  . Cyanocobalamin (B-12 PO) Take 1,000 mg by mouth daily.    . empagliflozin (JARDIANCE) 25 MG TABS tablet Take 25 mg by mouth daily. 90 tablet 0  . fluticasone furoate-vilanterol (BREO ELLIPTA) 100-25 MCG/INH AEPB Inhale 1 puff into the lungs daily. 1 each 2  . irbesartan-hydrochlorothiazide (AVALIDE) 300-12.5 MG tablet Take 1 tablet by mouth daily. 90 tablet 1  . levothyroxine (SYNTHROID, LEVOTHROID) 112 MCG tablet Take 1 tablet (112 mcg total) by mouth daily before breakfast. 30 tablet 2  . metFORMIN (GLUCOPHAGE) 1000 MG tablet Take 1 tablet (1,000 mg total) by mouth 2 (two) times daily with a meal. 180 tablet 0  . Omega-3 Fatty Acids (FISH OIL PO) Take 5,000  Units by mouth daily.    . sitaGLIPtin (JANUVIA) 100 MG tablet Take 1 tablet (100 mg total) by mouth daily. 90 tablet 0  . tiotropium (SPIRIVA) 18 MCG inhalation capsule Place 1 capsule (18 mcg total) into inhaler and inhale daily. 30 capsule 5   No current facility-administered medications for this visit.      Past Medical History:  Diagnosis Date  . Allergy   . Asthma   . Colon polyps   . Diabetes mellitus without complication (HCC)   . Gout   . Heart attack (HCC) 07/30/2016   PCI, 2 stents  . Hyperlipidemia   . Hypertension   . Internal hemorrhoids   . Left rotator cuff tear   . 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 Otomycosis of right ear 09/27/2017  . Paroxysmal atrial fibrillation (HCC)   . Thyroid disease     Past Surgical History:  Procedure Laterality Date  . ANGIOPLASTY    . COLONOSCOPY W/ POLYPECTOMY    . HEMORRHOID BANDING      Social History   Socioeconomic History  . Marital status: Divorced    Spouse name: Not on file  . Number of children: 2  . Years of education: Not on file  . Highest education level: Not on file  Social Needs  . Financial resource strain: Not on file  . Food insecurity - worry: Not on file  . Food insecurity - inability: Not  on file  . Transportation needs - medical: Not on file  . Transportation needs - non-medical: Not on file  Occupational History  . Not on file  Tobacco Use  . Smoking status: Never Smoker  . Smokeless tobacco: Never Used  Substance and Sexual Activity  . Alcohol use: Yes    Comment: Rare  . Drug use: No  . Sexual activity: Not Currently  Other Topics Concern  . Not on file  Social History Narrative  . Not on file    Family History  Problem Relation Age of Onset  . Hypertension Mother   . Hyperlipidemia Mother   . Glaucoma Father   . Heart disease Father   . Hypertension Father   . Diabetes Father   . Hyperlipidemia Father   . Skin cancer Father   . Diabetes Paternal Grandmother     ROS: no  fevers or chills, productive cough, hemoptysis, dysphasia, odynophagia, melena, hematochezia, dysuria, hematuria, rash, seizure activity, orthopnea, PND, pedal edema, claudication. Remaining systems are negative.  Physical Exam: Well-developed obese in no acute distress.  Skin is warm and dry.  HEENT is normal.  Neck is supple.  Chest is clear to auscultation with normal expansion.  Cardiovascular exam is regular rate and rhythm.  Abdominal exam nontender or distended. No masses palpated. Extremities show no edema. neuro grossly intact  ECG-October 16, 2017-sinus rhythm with PACs and PVCs.  Personally reviewed  A/P  1 paroxysmal atrial fibrillation-patient remains in sinus rhythm on examination today.  Embolic risk factors include female sex, diabetes mellitus, hypertension and age greater than 13.  She also has coronary disease.CHADSvasc 5.  Continue apixaban.  Continue atenolol for rate control if atrial fibrillation recurs.  Note she does have asthma and if this becomes more problematic could discontinue beta-blocker and instead treat with Cardizem.  2 coronary artery disease-patient has had prior PCI.  Continue Plavix and statin.  3 hypertension-blood pressure is borderline but she states typically controlled at home.  Continue present medications and follow.  4 hyperlipidemia-continue statin.  Olga Millers, MD

## 2018-01-08 ENCOUNTER — Other Ambulatory Visit: Payer: Self-pay

## 2018-01-08 ENCOUNTER — Ambulatory Visit (INDEPENDENT_AMBULATORY_CARE_PROVIDER_SITE_OTHER): Payer: Medicare HMO | Admitting: Cardiology

## 2018-01-08 ENCOUNTER — Encounter: Payer: Self-pay | Admitting: Cardiology

## 2018-01-08 VITALS — BP 142/83 | HR 88 | Ht 64.0 in | Wt 195.4 lb

## 2018-01-08 DIAGNOSIS — I480 Paroxysmal atrial fibrillation: Secondary | ICD-10-CM | POA: Diagnosis not present

## 2018-01-08 DIAGNOSIS — I10 Essential (primary) hypertension: Secondary | ICD-10-CM | POA: Diagnosis not present

## 2018-01-08 DIAGNOSIS — E7800 Pure hypercholesterolemia, unspecified: Secondary | ICD-10-CM | POA: Diagnosis not present

## 2018-01-08 DIAGNOSIS — I2510 Atherosclerotic heart disease of native coronary artery without angina pectoris: Secondary | ICD-10-CM | POA: Diagnosis not present

## 2018-01-08 MED ORDER — EMPAGLIFLOZIN 25 MG PO TABS: 25.0000 mg | ORAL_TABLET | Freq: Every day | ORAL | 1 refills | Status: DC

## 2018-01-08 NOTE — Telephone Encounter (Signed)
Medication sent.

## 2018-01-08 NOTE — Addendum Note (Signed)
Addended by: Chalmers Cater on: 01/08/2018 03:36 PM   Modules accepted: Orders

## 2018-01-08 NOTE — Patient Instructions (Signed)
Your physician wants you to follow-up in: ONE YEAR WITH DR CRENSHAW You will receive a reminder letter in the mail two months in advance. If you don't receive a letter, please call our office to schedule the follow-up appointment.   If you need a refill on your cardiac medications before your next appointment, please call your pharmacy.  

## 2018-01-08 NOTE — Telephone Encounter (Signed)
PT needs medicine sent to the [facility] Mail order pharmacy.   empagliflozin (JARDIANCE) 25 MG TABS tablet  Please call pt to advise this has been done.

## 2018-01-10 LAB — CBC WITH DIFFERENTIAL
BASOS ABS: 0.1 10*3/uL (ref 0.0–0.2)
Basos: 0 %
EOS (ABSOLUTE): 1 10*3/uL — ABNORMAL HIGH (ref 0.0–0.4)
EOS: 9 %
HEMATOCRIT: 37.3 % (ref 34.0–46.6)
HEMOGLOBIN: 12.8 g/dL (ref 11.1–15.9)
IMMATURE GRANS (ABS): 0 10*3/uL (ref 0.0–0.1)
IMMATURE GRANULOCYTES: 0 %
LYMPHS ABS: 1.8 10*3/uL (ref 0.7–3.1)
LYMPHS: 15 %
MCH: 29.6 pg (ref 26.6–33.0)
MCHC: 34.3 g/dL (ref 31.5–35.7)
MCV: 86 fL (ref 79–97)
MONOCYTES: 11 %
Monocytes Absolute: 1.3 10*3/uL — ABNORMAL HIGH (ref 0.1–0.9)
Neutrophils Absolute: 7.7 10*3/uL — ABNORMAL HIGH (ref 1.4–7.0)
Neutrophils: 65 %
RBC: 4.32 x10E6/uL (ref 3.77–5.28)
RDW: 13.5 % (ref 12.3–15.4)
WBC: 11.9 10*3/uL — ABNORMAL HIGH (ref 3.4–10.8)

## 2018-01-10 LAB — IGE: IGE (IMMUNOGLOBULIN E), SERUM: 712 [IU]/mL — AB (ref 0–100)

## 2018-01-28 ENCOUNTER — Encounter: Payer: Self-pay | Admitting: Registered"

## 2018-01-28 ENCOUNTER — Encounter: Payer: Medicare HMO | Attending: Physician Assistant | Admitting: Registered"

## 2018-01-28 DIAGNOSIS — E1165 Type 2 diabetes mellitus with hyperglycemia: Secondary | ICD-10-CM | POA: Insufficient documentation

## 2018-01-28 DIAGNOSIS — E6609 Other obesity due to excess calories: Secondary | ICD-10-CM | POA: Insufficient documentation

## 2018-01-28 DIAGNOSIS — Z713 Dietary counseling and surveillance: Secondary | ICD-10-CM | POA: Insufficient documentation

## 2018-01-28 NOTE — Patient Instructions (Signed)
Plan:  Aim for 2-3 Carb Choices per meal (30-45 grams)   Aim for 0-2 Carb Choices per snack if hungry (0-30 grams) Include protein with your meals and snacks Aim for 3 balanced meals per day, with not to heavy of a supper meal and not too late. Consider reading food labels for Total Carbohydrate and Saturated Fat Grams Continue your activity level daily as tolerated Consider checking blood sugar at alternate times per day as discussed Continue taking medication as directed by MD

## 2018-01-28 NOTE — Progress Notes (Signed)
Diabetes Self-Management Education  Visit Type: First/Initial  Appt. Start Time: 1400 Appt. End Time: 1510  01/28/2018  [name], identified by name and date of birth, is a 72 y.o. female with a diagnosis of Diabetes: Type 2.   ASSESSMENT Pt has recently moved to [place] from [facility] to help with caregiving for her sister's husband. Pt states her sister is an Charity fundraiser who has healthy lifestyle/eating habits and has been a good influence on her. Patient reports making changes such as increased physical activity and reduced portion sizes.  Patient states she believes her A1c has increased from 6.5% to 8.3% from all the holiday baking she was doing. Pt states last week she started the Reading. Per patient she will return to [name], [place], to have A1c rechecked April 25.  Patient states she gets 8-9 hrs sleep and feels rested and also recently stress in her life feels more manageable.  RD provided OneTouch Verio Flex meter Lot #: [id number] x Exp: 01/02/2019 BG: 261 mg/dL  Diabetes Self-Management Education - 01/28/18 1404      Visit Information   Visit Type  First/Initial      Initial Visit   Diabetes Type  Type 2    Are you currently following a meal plan?  No    Are you taking your medications as prescribed?  Yes metformin, Jardiance, Januvia    Date Diagnosed  ~20 yrs ago      Health Coping   How would you rate your overall health?  Fair      Psychosocial Assessment   Patient Belief/Attitude about Diabetes  Motivated to manage diabetes    How often do you need to have someone help you when you read instructions, pamphlets, or other written materials from your doctor or pharmacy?  1 - Never    What is the last grade level you completed in school?  some college      Complications   Last HgB A1C per patient/outside source  8.3 % Dec 2018    How often do you check your blood sugar?  0 times/day (not testing)    Have you had a dilated eye exam in the past 12 months?  Yes    Have you had a dental exam in the past 12 months?  Yes    Are you checking your feet?  Yes    How many days per week are you checking your feet?  3      Dietary Intake   Breakfast  none OR cereal OR egg, bacon, toast, 8-10 oz juice    Snack (morning)  none    Lunch  salad OR sandwich    Dinner  1/2 baked potato, chili, cheese, spinach, green beans, salad OR meatless meals    Snack (evening)  none OR popcorn or pretzels    Beverage(s)  water, unsweet tea      Exercise   Exercise Type  Light (walking / raking leaves)    How many days per week to you exercise?  4    How many minutes per day do you exercise?  20    Total minutes per week of exercise  80      Patient Education   Previous Diabetes Education  No    Disease state   Definition of diabetes, type 1 and 2, and the diagnosis of diabetes    Nutrition management   Role of diet in the treatment of diabetes and the relationship between the three main macronutrients  and blood glucose level;Carbohydrate counting;Food label reading, portion sizes and measuring food.    Physical activity and exercise   Role of exercise on diabetes management, blood pressure control and cardiac health.    Medications  Reviewed patients medication for diabetes, action, purpose, timing of dose and side effects.    Monitoring  Taught/evaluated SMBG meter.;Identified appropriate SMBG and/or A1C goals.    Psychosocial adjustment  Role of stress on diabetes      Individualized Goals (developed by patient)   Nutrition  General guidelines for healthy choices and portions discussed    Medications  take my medication as prescribed    Monitoring   test my blood glucose as discussed      Outcomes   Expected Outcomes  Demonstrated interest in learning. Expect positive outcomes    Future DMSE  2 months    Program Status  Completed     Individualized Plan for Diabetes Self-Management Training:   Learning Objective:  Patient will have a greater understanding of  diabetes self-management. Patient education plan is to attend individual and/or group sessions per assessed needs and concerns.   Patient Instructions  Plan:  Aim for 2-3 Carb Choices per meal (30-45 grams)   Aim for 0-2 Carb Choices per snack if hungry (0-30 grams) Include protein with your meals and snacks Aim for 3 balanced meals per day, with not to heavy of a supper meal and not too late. Consider reading food labels for Total Carbohydrate and Saturated Fat Grams Continue your activity level daily as tolerated Consider checking blood sugar at alternate times per day as discussed Continue taking medication as directed by MD   Expected Outcomes:  Demonstrated interest in learning. Expect positive outcomes  Education material provided: Living Well with Diabetes, A1C conversion sheet, Snack sheet and Carbohydrate counting sheet, Medications,   If problems or questions, patient to contact team via:  Phone and MyChart  Future DSME appointment: 2 months

## 2018-01-30 ENCOUNTER — Ambulatory Visit: Payer: Medicare HMO | Admitting: Pulmonary Disease

## 2018-01-30 ENCOUNTER — Ambulatory Visit (INDEPENDENT_AMBULATORY_CARE_PROVIDER_SITE_OTHER): Payer: Medicare HMO | Admitting: Pulmonary Disease

## 2018-01-30 ENCOUNTER — Encounter: Payer: Self-pay | Admitting: Pulmonary Disease

## 2018-01-30 VITALS — BP 126/68 | HR 85 | Ht 64.0 in | Wt 191.0 lb

## 2018-01-30 DIAGNOSIS — J45909 Unspecified asthma, uncomplicated: Secondary | ICD-10-CM

## 2018-01-30 LAB — PULMONARY FUNCTION TEST
DL/VA % pred: 118 %
DL/VA: 5.67 ml/min/mmHg/L
DLCO COR % PRED: 84 %
DLCO UNC % PRED: 82 %
DLCO UNC: 20.06 ml/min/mmHg
DLCO cor: 20.45 ml/min/mmHg
FEF 25-75 POST: 0.81 L/s
FEF 25-75 PRE: 0.68 L/s
FEF2575-%Change-Post: 19 %
FEF2575-%PRED-POST: 47 %
FEF2575-%Pred-Pre: 39 %
FEV1-%Change-Post: 5 %
FEV1-%PRED-POST: 57 %
FEV1-%Pred-Pre: 54 %
FEV1-Post: 1.24 L
FEV1-Pre: 1.17 L
FEV1FVC-%Change-Post: 2 %
FEV1FVC-%PRED-PRE: 89 %
FEV6-%Change-Post: 4 %
FEV6-%PRED-POST: 66 %
FEV6-%Pred-Pre: 63 %
FEV6-POST: 1.8 L
FEV6-Pre: 1.73 L
FEV6FVC-%PRED-POST: 105 %
FEV6FVC-%Pred-Pre: 105 %
FVC-%Change-Post: 3 %
FVC-%Pred-Post: 63 %
FVC-%Pred-Pre: 60 %
FVC-PRE: 1.74 L
FVC-Post: 1.8 L
PRE FEV1/FVC RATIO: 67 %
Post FEV1/FVC ratio: 69 %
Post FEV6/FVC ratio: 100 %
Pre FEV6/FVC Ratio: 100 %
RV % pred: 87 %
RV: 1.98 L
TLC % pred: 80 %
TLC: 4.04 L

## 2018-01-30 NOTE — Progress Notes (Signed)
Subjective:   PATIENT ID: Tara Campbell: female DOB: 11/08/1946, MRN: [id number]   Synopsis: Referred in January 2019 for asthma  HPI  Chief Complaint  Patient presents with  . Follow-up    pt doing well, denies any current breathing complaints.  review PFT.     Tara Campbell says she is doing well right now.  She says that she has not been on prednisone twice since moving to [place] and she hasn't been on it for a year at this point.  She had some difficulty after refurbishing a table yesterday but otherwise she is been well.  She has not had bronchitis or flu or any other respiratory problems since she saw me.  She says she really does not need to use albuterol.   Past Medical History:  Diagnosis Date  . Allergy   . Asthma   . Colon polyps   . Diabetes mellitus without complication (HCC)   . Gout   . Heart attack (HCC) 07/30/2016   PCI, 2 stents  . Hyperlipidemia   . Hypertension   . Internal hemorrhoids   . Left rotator cuff tear   . 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 Otomycosis of right ear 09/27/2017  . Paroxysmal atrial fibrillation (HCC)   . Thyroid disease         Review of Systems  Constitutional: Negative for chills, fever, malaise/fatigue and weight loss.  HENT: Negative for congestion, nosebleeds, sinus pain and sore throat.   Eyes: Negative for photophobia, pain and discharge.  Respiratory: Negative for cough, hemoptysis, sputum production, shortness of breath and wheezing.   Cardiovascular: Negative for chest pain, palpitations, orthopnea and leg swelling.  Gastrointestinal: Negative for abdominal pain, constipation, diarrhea, nausea and vomiting.  Genitourinary: Negative for dysuria, frequency, hematuria and urgency.  Musculoskeletal: Negative for back pain, joint pain, myalgias and neck pain.  Skin: Negative for itching and rash.  Neurological: Negative for tingling, tremors, sensory change, speech change, focal weakness, seizures, weakness and  headaches.  Psychiatric/Behavioral: Negative for memory loss, substance abuse and suicidal ideas. The patient is not nervous/anxious.       Objective:  Physical Exam   Vitals:   01/30/18 1322  BP: 126/68  Pulse: 85  SpO2: 97%  Weight: 191 lb (86.6 kg)  Height: 5\' 4"  (1.626 m)    Gen: well appearing HENT: OP clear, TM's clear, neck supple PULM: CTA B, normal percussion CV: RRR, no mgr, trace edema GI: BS+, soft, nontender Derm: no cyanosis or rash Psyche: normal mood and affect    CBC    Component Value Date/Time   WBC 11.9 (H) 12/26/2017 1630   WBC 8.2 05/29/2017 0909   RBC 4.32 12/26/2017 1630   RBC 4.49 05/29/2017 0909   HGB 12.8 12/26/2017 1630   HCT 37.3 12/26/2017 1630   PLT 396 05/29/2017 0909   MCV 86 12/26/2017 1630   MCH 29.6 12/26/2017 1630   MCH 28.5 05/29/2017 0909   MCHC 34.3 12/26/2017 1630   MCHC 33.2 05/29/2017 0909   RDW 13.5 12/26/2017 1630   LYMPHSABS 1.8 12/26/2017 1630   EOSABS 1.0 (H) 12/26/2017 1630   BASOSABS 0.1 12/26/2017 1630     Chest imaging:  PFT: 01/2018 PFT: Ratio 69%, FEV1 1.24L 57% pred, total lung capacity 4.04 L 80% predicted, DLCO 20.06 mL 82% predicted  Labs: 12/2017 CBC: absolute eos count 1000 cell/dL 06/1251 IgE 712  Path:  Echo:  Heart Catheterization:  Records from her visit with [name] reviewed  where she was noted to be complaining of dyspnea on exertion despite taking Breo and Spiriva.       Assessment & Plan:   Asthma, unspecified asthma severity, unspecified whether complicated, unspecified whether persistent  Discussion: This has been a stable interval for [name].  Since moving to [place] from [place] her asthma has been much better controlled.  She has moderate airflow obstruction and an allergic phenotype with a high eosinophil count.  However, despite this she really has no symptoms.  In fact I think we can back off on the Spiriva.  However, if she does have increasing symptoms  or recurrent exacerbations we could consider biologic therapy considering her blood work.  Plan: Moderate persistent asthma: I am glad this is well controlled Stop Spiriva Keep taking BREO Continue albuterol as needed Practice good hand hygiene this time a year Let me know if you have worsening symptoms while on Spiriva  I will see you back in 6 months or sooner if need     Current Outpatient Medications:  .  albuterol (PROVENTIL HFA;VENTOLIN HFA) 108 (90 Base) MCG/ACT inhaler, Inhale 2 puffs into the lungs every 6 (six) hours as needed for wheezing., Disp: 2 Inhaler, Rfl: 1 .  allopurinol (ZYLOPRIM) 300 MG tablet, Take 1 tablet (300 mg total) by mouth 2 (two) times daily., Disp: 60 tablet, Rfl: 3 .  apixaban (ELIQUIS) 5 MG TABS tablet, Take 1 tablet (5 mg total) by mouth 2 (two) times daily., Disp: 60 tablet, Rfl: 2 .  atenolol (TENORMIN) 100 MG tablet, TAKE 1 TABLET BY MOUTH  DAILY, Disp: 90 tablet, Rfl: 1 .  atorvastatin (LIPITOR) 80 MG tablet, Take 1 tablet (80 mg total) by mouth daily., Disp: 90 tablet, Rfl: 3 .  Cholecalciferol (VITAMIN D3 PO), Take 5,000 mg by mouth daily., Disp: , Rfl:  .  clopidogrel (PLAVIX) 75 MG tablet, TAKE 1 TABLET BY MOUTH  DAILY, Disp: 90 tablet, Rfl: 1 .  Cyanocobalamin (B-12 PO), Take 1,000 mg by mouth daily., Disp: , Rfl:  .  empagliflozin (JARDIANCE) 25 MG TABS tablet, Take 25 mg by mouth daily., Disp: 90 tablet, Rfl: 1 .  fluticasone furoate-vilanterol (BREO ELLIPTA) 100-25 MCG/INH AEPB, Inhale 1 puff into the lungs daily., Disp: 1 each, Rfl: 2 .  irbesartan-hydrochlorothiazide (AVALIDE) 300-12.5 MG tablet, Take 1 tablet by mouth daily., Disp: 90 tablet, Rfl: 1 .  levothyroxine (SYNTHROID, LEVOTHROID) 112 MCG tablet, Take 1 tablet (112 mcg total) by mouth daily before breakfast., Disp: 30 tablet, Rfl: 2 .  metFORMIN (GLUCOPHAGE) 1000 MG tablet, Take 1 tablet (1,000 mg total) by mouth 2 (two) times daily with a meal., Disp: 180 tablet, Rfl: 0 .   Omega-3 Fatty Acids (FISH OIL PO), Take 5,000 Units by mouth daily., Disp: , Rfl:  .  sitaGLIPtin (JANUVIA) 100 MG tablet, Take 1 tablet (100 mg total) by mouth daily., Disp: 90 tablet, Rfl: 0 .  tiotropium (SPIRIVA) 18 MCG inhalation capsule, Place 1 capsule (18 mcg total) into inhaler and inhale daily., Disp: 30 capsule, Rfl: 5

## 2018-01-30 NOTE — Patient Instructions (Signed)
Moderate persistent asthma: I am glad this is well controlled Stop Spiriva Keep taking BREO Continue albuterol as needed Practice good hand hygiene this time a year Let me know if you have worsening symptoms while on Spiriva  I will see you back in 6 months or sooner if need

## 2018-01-30 NOTE — Progress Notes (Signed)
PFT completed today 01/30/18  

## 2018-02-27 ENCOUNTER — Encounter: Payer: Self-pay | Admitting: Physician Assistant

## 2018-02-27 ENCOUNTER — Ambulatory Visit (INDEPENDENT_AMBULATORY_CARE_PROVIDER_SITE_OTHER): Payer: Medicare HMO | Admitting: Physician Assistant

## 2018-02-27 VITALS — BP 114/73 | HR 77 | Temp 97.7°F | Wt 193.0 lb

## 2018-02-27 DIAGNOSIS — J4541 Moderate persistent asthma with (acute) exacerbation: Secondary | ICD-10-CM

## 2018-02-27 MED ORDER — PREDNISONE 50 MG PO TABS: ORAL_TABLET | ORAL | 0 refills | Status: DC

## 2018-02-27 MED ORDER — DOXYCYCLINE HYCLATE 100 MG PO TABS: 100.0000 mg | ORAL_TABLET | Freq: Two times a day (BID) | ORAL | 0 refills | Status: DC

## 2018-02-27 MED ORDER — IPRATROPIUM-ALBUTEROL 0.5-2.5 (3) MG/3ML IN SOLN
3.0000 mL | Freq: Once | RESPIRATORY_TRACT | Status: AC
  Administered 2018-02-27: 3 mL via RESPIRATORY_TRACT

## 2018-02-27 MED ORDER — IPRATROPIUM-ALBUTEROL 0.5-2.5 (3) MG/3ML IN SOLN: 3.0000 mL | RESPIRATORY_TRACT | 5 refills | Status: DC | PRN

## 2018-02-27 MED ORDER — IPRATROPIUM-ALBUTEROL 0.5-2.5 (3) MG/3ML IN SOLN: 3.0000 mL | RESPIRATORY_TRACT | 0 refills | Status: DC | PRN

## 2018-02-27 MED ORDER — NEBULIZER SYSTEM ALL-IN-ONE MISC: 1.0000 | 0 refills | Status: DC | PRN

## 2018-02-27 NOTE — Patient Instructions (Addendum)
- steroid burst for 5 days - antibiotic for 1 week - use neb or albuterol inhaler every 4 hours as needed - for acute attack, can do 2-6 puffs of inhaler x 2, 20 minutes apart    Asthma Attack Prevention, Adult Although you may not be able to control the fact that you have asthma, you can take actions to prevent episodes of asthma (asthma attacks). These actions include:  Creating a written plan for managing and treating your asthma attacks (asthma action plan).  Monitoring your asthma.  Avoiding things that can irritate your airways or make your asthma symptoms worse (asthma triggers).  Taking your medicines as directed.  Acting quickly if you have signs or symptoms of an asthma attack.  What are some ways to prevent an asthma attack? Create a plan Work with your health care provider to create an asthma action plan. This plan should include:  A list of your asthma triggers and how to avoid them.  A list of symptoms that you experience during an asthma attack.  Information about when to take medicine and how much medicine to take.  Information to help you understand your peak flow measurements.  Contact information for your health care providers.  Daily actions that you can take to control asthma.  Monitor your asthma  To monitor your asthma:  Use your peak flow meter every morning and every evening for 2-3 weeks. Record the results in a journal. A drop in your peak flow numbers on one or more days may mean that you are starting to have an asthma attack, even if you are not having symptoms.  When you have asthma symptoms, write them down in a journal.  Avoid asthma triggers  Work with your health care provider to find out what your asthma triggers are. This can be done by:  Being tested for allergies.  Keeping a journal that notes when asthma attacks occur and what may have contributed to them.  Asking your health care provider whether other medical conditions make  your asthma worse.  Common asthma triggers include:  Dust.  Smoke. This includes campfire smoke and secondhand smoke from tobacco products.  Pet dander.  Trees, grasses or pollens.  Very cold, dry, or humid air.  Mold.  Foods that contain high amounts of sulfites.  Strong smells.  Engine exhaust and air pollution.  Aerosol sprays and fumes from household cleaners.  Household pests and their droppings, including dust mites and cockroaches.  Certain medicines, including NSAIDs.  Once you have determined your asthma triggers, take steps to avoid them. Depending on your triggers, you may be able to reduce the chance of an asthma attack by:  Keeping your home clean. Have someone dust and vacuum your home for you 1 or 2 times a week. If possible, have them use a high-efficiency particulate arrestance (HEPA) vacuum.  Washing your sheets weekly in hot water.  Using allergy-proof mattress covers and casings on your bed.  Keeping pets out of your home.  Taking care of mold and water problems in your home.  Avoiding areas where people smoke.  Avoiding using strong perfumes or odor sprays.  Avoid spending a lot of time outdoors when pollen counts are high and on very windy days.  Talking with your health care provider before stopping or starting any new medicines.  Medicines Take over-the-counter and prescription medicines only as told by your health care provider. Many asthma attacks can be prevented by carefully following your medicine schedule. Taking  your medicines correctly is especially important when you cannot avoid certain asthma triggers. Even if you are doing well, do not stop taking your medicine and do not take less medicine. Act quickly If an asthma attack happens, acting quickly can decrease how severe it is and how long it lasts. Take these actions:  Pay attention to your symptoms. If you are coughing, wheezing, or having difficulty breathing, do not wait to  see if your symptoms go away on their own. Follow your asthma action plan.  If you have followed your asthma action plan and your symptoms are not improving, call your health care provider or seek immediate medical care at the nearest hospital.  It is important to write down how often you need to use your fast-acting rescue inhaler. You can track how often you use an inhaler in your journal. If you are using your rescue inhaler more often, it may mean that your asthma is not under control. Adjusting your asthma treatment plan may help you to prevent future asthma attacks and help you to gain better control of your condition. How can I prevent an asthma attack when I exercise?  Exercise is a common asthma trigger. To prevent asthma attacks during exercise:  Follow advice from your health care provider about whether you should use your fast-acting inhaler before exercising. Many people with asthma experience exercise-induced bronchoconstriction (EIB). This condition often worsens during vigorous exercise in cold, humid, or dry environments. Usually, people with EIB can stay very active by using a fast-acting inhaler before exercising.  Avoid exercising outdoors in very cold or humid weather.  Avoid exercising outdoors when pollen counts are high.  Warm up and cool down when exercising.  Stop exercising right away if asthma symptoms start.  Consider taking part in exercises that are less likely to cause asthma symptoms such as:  Indoor swimming.  Biking.  Walking.  Hiking.  Playing football.  This information is not intended to replace advice given to you by your health care provider. Make sure you discuss any questions you have with your health care provider. Document Released: 11/07/2009 Document Revised: 07/20/2016 Document Reviewed: 05/05/2016 Elsevier Interactive Patient Education  [employer].

## 2018-02-27 NOTE — Progress Notes (Signed)
HPI:                                                                Tara Campbell is a 72 y.o. female who presents to [name] today for asthma exacerbation  Asthma  She complains of chest tightness, cough, difficulty breathing, shortness of breath, sputum production and wheezing. There is no hemoptysis. This is a recurrent problem. The current episode started in the past 7 days. The problem occurs daily. The problem has been gradually worsening. The cough is productive of sputum. Associated symptoms include dyspnea on exertion. Pertinent negatives include no chest pain. Her symptoms are aggravated by any activity and URI. Her symptoms are alleviated by beta-agonist. She reports moderate improvement on treatment. There are no known risk factors for lung disease. Her past medical history is significant for asthma.  Developed URI symptoms (chills, congestion, sneezing) about 1 week ago. For the last 2 days has had worsening wheezing, dyspnea on exertion, and productive cough. No fever, hemoptysis, nausea/vomting. Used rescue inhaler 3 times yesterday.    Depression screen [facility] 2/9 01/28/2018 05/29/2017  Decreased Interest 0 0  Down, Depressed, Hopeless 0 0  PHQ - 2 Score 0 0    No flowsheet data found.    Past Medical History:  Diagnosis Date  . Allergy   . Asthma   . Colon polyps   . Diabetes mellitus without complication (HCC)   . Gout   . Heart attack (HCC) 07/30/2016   PCI, 2 stents  . Hyperlipidemia   . Hypertension   . Internal hemorrhoids   . Left rotator cuff tear   . 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 Otomycosis of right ear 09/27/2017  . Paroxysmal atrial fibrillation (HCC)   . Thyroid disease    Past Surgical History:  Procedure Laterality Date  . ANGIOPLASTY    . COLONOSCOPY W/ POLYPECTOMY    . HEMORRHOID BANDING     Social History   Tobacco Use  . Smoking status: Never Smoker  . Smokeless tobacco: Never  Used  Substance Use Topics  . Alcohol use: Yes    Comment: Rare   family history includes Diabetes in her father and paternal grandmother; Glaucoma in her father; Heart disease in her father; Hyperlipidemia in her father and mother; Hypertension in her father and mother; Skin cancer in her father.    ROS: negative except as noted in the HPI  Medications: Current Outpatient Medications  Medication Sig Dispense Refill  . albuterol (PROVENTIL HFA;VENTOLIN HFA) 108 (90 Base) MCG/ACT inhaler Inhale 2 puffs into the lungs every 6 (six) hours as needed for wheezing. 2 Inhaler 1  . allopurinol (ZYLOPRIM) 300 MG tablet Take 1 tablet (300 mg total) by mouth 2 (two) times daily. 60 tablet 3  . apixaban (ELIQUIS) 5 MG TABS tablet Take 1 tablet (5 mg total) by mouth 2 (two) times daily. 60 tablet 2  . atenolol (TENORMIN) 100 MG tablet TAKE 1 TABLET BY MOUTH  DAILY 90 tablet 1  . atorvastatin (LIPITOR) 80 MG tablet Take 1 tablet (80 mg total) by mouth daily. 90 tablet 3  . Cholecalciferol (VITAMIN D3 PO) Take 5,000 mg by mouth daily.    . clopidogrel (PLAVIX) 75 MG tablet  TAKE 1 TABLET BY MOUTH  DAILY 90 tablet 1  . Cyanocobalamin (B-12 PO) Take 1,000 mg by mouth daily.    . empagliflozin (JARDIANCE) 25 MG TABS tablet Take 25 mg by mouth daily. 90 tablet 1  . fluticasone furoate-vilanterol (BREO ELLIPTA) 100-25 MCG/INH AEPB Inhale 1 puff into the lungs daily. 1 each 2  . irbesartan-hydrochlorothiazide (AVALIDE) 300-12.5 MG tablet Take 1 tablet by mouth daily. 90 tablet 1  . levothyroxine (SYNTHROID, LEVOTHROID) 112 MCG tablet Take 1 tablet (112 mcg total) by mouth daily before breakfast. 30 tablet 2  . metFORMIN (GLUCOPHAGE) 1000 MG tablet Take 1 tablet (1,000 mg total) by mouth 2 (two) times daily with a meal. 180 tablet 0  . Omega-3 Fatty Acids (FISH OIL PO) Take 5,000 Units by mouth daily.    . sitaGLIPtin (JANUVIA) 100 MG tablet Take 1 tablet (100 mg total) by mouth daily. 90 tablet 0   No  current facility-administered medications for this visit.    Allergies  Allergen Reactions  . Sulfamethoxazole Rash    HIVES  . Latex   . Sulfa Antibiotics        Objective:  BP 114/73   Pulse 77   Temp 97.7 F (36.5 C) (Oral)   Wt 193 lb (87.5 kg)   SpO2 93%   BMI 33.13 kg/m  Gen:  alert, not ill-appearing, no distress, appropriate for age, obese female HEENT: head normocephalic without obvious abnormality, conjunctiva and cornea clear, wearing glasses, oropharynx clear, nasal mucosa edematous, neck supple, no cervical adenopathy,  trachea midline Pulm: Normal work of breathing, normal phonation, inspiratory wheezes and diffusely coarse expiratory sounds CV: Normal rate, regular rhythm, s1 and s2 distinct, no murmurs, clicks or rubs  Neuro: alert and oriented x 3, no tremor MSK: extremities atraumatic, normal gait and station Skin: intact, no rashes on exposed skin, no jaundice, no cyanosis    No results found for this or any previous visit (from the past 72 hour(s)). No results found.    Assessment and Plan: 72 y.o. female with   Moderate persistent asthma with acute exacerbation - SpO2 93% on RA at rest, no evidence of respiratory distress. Afebrile, no tachycardia. Treating for acute exacerbation with steroid burst, Doxycycline, and DuoNebs Q4H. Patient advised on asthma treatment plan.   Moderate persistent asthma with acute exacerbation - Plan: NEBULIZER SYSTEM ALL-IN-ONE MISC, predniSONE (DELTASONE) 50 MG tablet, doxycycline (VIBRA-TABS) 100 MG tablet, ipratropium-albuterol (DUONEB) 0.5-2.5 (3) MG/3ML SOLN, ipratropium-albuterol (DUONEB) 0.5-2.5 (3) MG/3ML nebulizer solution 3 mL, DISCONTINUED: ipratropium-albuterol (DUONEB) 0.5-2.5 (3) MG/3ML SOLN    Patient education and anticipatory guidance given Patient agrees with treatment plan Follow-up in 1 week or sooner as needed if symptoms worsen or fail to improve  Levonne Hubert PA-C

## 2018-02-28 ENCOUNTER — Encounter: Payer: Self-pay | Admitting: Physician Assistant

## 2018-03-04 ENCOUNTER — Encounter: Payer: Self-pay | Admitting: Physician Assistant

## 2018-03-06 ENCOUNTER — Encounter: Payer: Self-pay | Admitting: Physician Assistant

## 2018-03-06 ENCOUNTER — Ambulatory Visit (INDEPENDENT_AMBULATORY_CARE_PROVIDER_SITE_OTHER): Payer: Medicare HMO | Admitting: Physician Assistant

## 2018-03-06 VITALS — BP 169/88 | HR 95 | Wt 193.0 lb

## 2018-03-06 DIAGNOSIS — E6609 Other obesity due to excess calories: Secondary | ICD-10-CM

## 2018-03-06 DIAGNOSIS — E1165 Type 2 diabetes mellitus with hyperglycemia: Secondary | ICD-10-CM

## 2018-03-06 DIAGNOSIS — I10 Essential (primary) hypertension: Secondary | ICD-10-CM

## 2018-03-06 DIAGNOSIS — J4540 Moderate persistent asthma, uncomplicated: Secondary | ICD-10-CM | POA: Diagnosis not present

## 2018-03-06 DIAGNOSIS — Z713 Dietary counseling and surveillance: Secondary | ICD-10-CM

## 2018-03-06 MED ORDER — DULAGLUTIDE 0.75 MG/0.5ML ~~LOC~~ SOAJ: 0.5000 mL | SUBCUTANEOUS | 11 refills | Status: DC

## 2018-03-06 MED ORDER — SITAGLIPTIN PHOS-METFORMIN HCL 50-1000 MG PO TABS: 1.0000 | ORAL_TABLET | Freq: Two times a day (BID) | ORAL | 1 refills | Status: DC

## 2018-03-06 NOTE — Patient Instructions (Addendum)
Re-start your Metformin twice a day with meals Continue Januvia  Start Trulicity  WEIGHT LOSS PLAN - 1350 calorie diet  - 30-35% calories from protein - 30% calories from fat - 35-40% from carbs: If diabetic, limit carbs to 30 g per meal and 15 g per snack - MEASURE your calories (MyFitnessPal, LoseItapp of some sort) - 8-11 glasses of water per day - limit caffeine to 1 unsweetened beverage per day - avoid fried foods, saturated fat, and heavily processed foods - keep sugar as low as possible  - follow DASH, ADA or Mediterranean diets for recipes - avoid skipping meals. Eat 3 regular meals per day with 1-2 snacks (stay within your calorie goal) OR graze every 4 hours. The important thing is to stay on a schedule - avoid eating within 2 hours of bedtime

## 2018-03-06 NOTE — Progress Notes (Signed)
HPI:                                                                Tara Campbell is a 72 y.o. female who presents to [facility]: [place] today for asthma exacerbation follow-up  Current concerns include: weight loss  Asthma: Patient treated for acute asthma exacerbation 1 week ago. Treated with steroid burst, Doxycycline and DuoNebs Q4H. Reports she is feeling much improved. No fever, chills, dyspnea, or cough. No need for rescue inhaler. Home SpO2 96-97%  Weight gain: reports gradual weight gain in post-menopausal period. Highest adult weight 220, lowest healthy adult weight 150 Lb (>25 years ago). Has been at current weight of 188-195 for the last 2 years. When asked about health goals she states "would like to lose 25 pounds." Previous weight loss attempts: calorie restriction and increased physical activity She met with an RD on 01/28/18, but did not find the visit to be particularly helpful. Barriers: reports struggling w/binge eating, carbohydrates, portion control Co-morbidities: hypothyroidism, Type 2 diabetes  DMII: taking Januvia and Jardiance daily. Reports stopping Metformin at the advice of her Cardiologist. She wanted to see if she could consolidate her medications and he told her "it's an old medication and the other agents are better." Otherwise she is compliant with medications. Last A1C 8.3, 3 months ago. Does not check blood sugars at home. Denies hypoglycemic events. Denies polydipsia, polyuria, polyphagia. Denies blurred vision or vision change. Denies extremity pain, altered sensation and paresthesias.  Denies ulcers/wounds on feet. Diet: good, no sweetened beverages, fresh fruits/veggies, eats 2 meals per day Exercise: trying to walk more    No flowsheet data found.    Past Medical History:  Diagnosis Date  . Allergy   . Asthma   . Colon polyps   . Diabetes mellitus without complication ([place])   . Gout   . Heart  attack ([place]) 07/30/2016   PCI, 2 stents  . Hyperlipidemia   . Hypertension   . Internal hemorrhoids   . Left rotator cuff tear   . 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 Otomycosis of right ear 09/27/2017  . Paroxysmal atrial fibrillation (HCC)   . Thyroid disease    Past Surgical History:  Procedure Laterality Date  . ANGIOPLASTY    . COLONOSCOPY W/ POLYPECTOMY    . HEMORRHOID BANDING     Social History   Tobacco Use  . Smoking status: Never Smoker  . Smokeless tobacco: Never Used  Substance Use Topics  . Alcohol use: Yes    Comment: Rare   family history includes Diabetes in her father and paternal grandmother; Glaucoma in her father; Heart disease in her father; Hyperlipidemia in her father and mother; Hypertension in her father and mother; Skin cancer in her father.    ROS: negative except as noted in the HPI  Medications: Current Outpatient Medications  Medication Sig Dispense Refill  . albuterol (PROVENTIL HFA;VENTOLIN HFA) 108 (90 Base) MCG/ACT inhaler Inhale 2 puffs into the lungs every 6 (six) hours as needed for wheezing. 2 Inhaler 1  . allopurinol (ZYLOPRIM) 300 MG tablet Take 1 tablet (300 mg total) by mouth 2 (two) times daily. 60 tablet 3  . apixaban (ELIQUIS) 5 MG TABS tablet Take 1  tablet (5 mg total) by mouth 2 (two) times daily. 60 tablet 2  . atenolol (TENORMIN) 100 MG tablet TAKE 1 TABLET BY MOUTH  DAILY 90 tablet 1  . atorvastatin (LIPITOR) 80 MG tablet Take 1 tablet (80 mg total) by mouth daily. 90 tablet 3  . Cholecalciferol (VITAMIN D3 PO) Take 5,000 mg by mouth daily.    . clopidogrel (PLAVIX) 75 MG tablet TAKE 1 TABLET BY MOUTH  DAILY 90 tablet 1  . Cyanocobalamin (B-12 PO) Take 1,000 mg by mouth daily.    . empagliflozin (JARDIANCE) 25 MG TABS tablet Take 25 mg by mouth daily. 90 tablet 1  . fluticasone furoate-vilanterol (BREO ELLIPTA) 100-25 MCG/INH AEPB Inhale 1 puff into the lungs daily. 1 each 2  . ipratropium-albuterol (DUONEB) 0.5-2.5 (3)  MG/3ML SOLN Take 3 mLs by nebulization every 4 (four) hours as needed. 3 mL 0  . irbesartan-hydrochlorothiazide (AVALIDE) 300-12.5 MG tablet Take 1 tablet by mouth daily. 90 tablet 1  . levothyroxine (SYNTHROID, LEVOTHROID) 112 MCG tablet Take 1 tablet (112 mcg total) by mouth daily before breakfast. 30 tablet 2  . metFORMIN (GLUCOPHAGE) 1000 MG tablet Take 1 tablet (1,000 mg total) by mouth 2 (two) times daily with a meal. 180 tablet 0  . NEBULIZER SYSTEM ALL-IN-ONE MISC 1 Device by Does not apply route every 4 (four) hours as needed. 1 each 0  . Omega-3 Fatty Acids (FISH OIL PO) Take 5,000 Units by mouth daily.    . sitaGLIPtin (JANUVIA) 100 MG tablet Take 1 tablet (100 mg total) by mouth daily. 90 tablet 0   No current facility-administered medications for this visit.    Allergies  Allergen Reactions  . Latex   . Sulfa Antibiotics Rash       Objective:  BP (!) 169/88   Pulse 95   Wt 193 lb (87.5 kg)   SpO2 95%   BMI 33.13 kg/m  Gen:  alert, not ill-appearing, no distress, appropriate for age, obese female HEENT: head normocephalic without obvious abnormality, conjunctiva and cornea clear, wearing glasses, trachea midline Pulm: Normal work of breathing, normal phonation, clear to auscultation bilaterally, no wheezes, rales or rhonchi CV: Normal rate, regular rhythm, s1 and s2 distinct, no murmurs, clicks or rubs  Neuro: alert and oriented x 3, no tremor MSK: extremities atraumatic, normal gait and station Skin: intact, no rashes on exposed skin, no jaundice, no cyanosis Psych: well-groomed, cooperative, good eye contact, euthymic mood, affect mood-congruent, speech is articulate, and thought processes clear and goal-directed    No results found for this or any previous visit (from the past 72 hour(s)). No results found.    Assessment and Plan: 72 y.o. female with   1. Uncontrolled type 2 diabetes mellitus with hyperglycemia ([place]) Lab Results  Component Value Date    HGBA1C 8.3 (H) 11/28/2017  - counseled on importance of Metformin for not only glycemic control but also preventing progression of insulin resistance and other health benefits, including cancer prevention and gut biome. We will switch her to Janumet when her current prescriptions run out to consolidate number of pills - continue Jardiance for CVD risk - adding Trulicity for weight loss in addition to glycemic control - recheck A1C in 1 month  2. Class 1 obesity due to excess calories with serious comorbidity in adult, unspecified BMI Wt Readings from Last 3 Encounters:  03/06/18 193 lb (87.5 kg)  02/27/18 193 lb (87.5 kg)  01/30/18 191 lb (86.6 kg)     3. Encounter  for weight loss counseling - counseled on weight loss through decreased caloric intake and increased aerobic exercise - 1300-1350 calorie moderate protein diet. Limit carbs to 30g per meal and 15 g per snack. Follow ADA nutrition guidelines - adding Trulicity for glycemic control and weight loss  4. Moderate persistent asthma without complication - resolved, SpO2 95% at rest on RA today - cont Breo daily - if add'l exacerbation, will consider switching her from Atenolol to Cardizem - keep routine follow-up with Pulm  5. Elevated BP reading with diagnosis of HTN BP Readings from Last 3 Encounters:  03/06/18 (!) 169/88  02/27/18 114/73  01/30/18 126/68  - BP is typically in range in office and at home - continue Avalide and Atenolol - therapeutic lifestyle changes, including weight loss - follow-up in 1 month. If still elevated, consider sleep study     Patient education and anticipatory guidance given Patient agrees with treatment plan Follow-up in 1 month for A1c, hypertension and weight check or sooner as needed if symptoms worsen or fail to improve  Darlyne Russian PA-C

## 2018-03-07 ENCOUNTER — Emergency Department (HOSPITAL_BASED_OUTPATIENT_CLINIC_OR_DEPARTMENT_OTHER)
Admission: EM | Admit: 2018-03-07 | Discharge: 2018-03-07 | Disposition: A | Discharge status: Home / Self Care | Payer: Medicare HMO | Attending: Emergency Medicine | Admitting: Emergency Medicine

## 2018-03-07 ENCOUNTER — Other Ambulatory Visit: Payer: Self-pay

## 2018-03-07 ENCOUNTER — Encounter (HOSPITAL_BASED_OUTPATIENT_CLINIC_OR_DEPARTMENT_OTHER): Payer: Self-pay | Admitting: *Deleted

## 2018-03-07 ENCOUNTER — Emergency Department (HOSPITAL_BASED_OUTPATIENT_CLINIC_OR_DEPARTMENT_OTHER): Payer: Medicare HMO

## 2018-03-07 DIAGNOSIS — Z7984 Long term (current) use of oral hypoglycemic drugs: Secondary | ICD-10-CM | POA: Diagnosis not present

## 2018-03-07 DIAGNOSIS — I10 Essential (primary) hypertension: Secondary | ICD-10-CM | POA: Diagnosis not present

## 2018-03-07 DIAGNOSIS — R0789 Other chest pain: Secondary | ICD-10-CM | POA: Diagnosis present

## 2018-03-07 DIAGNOSIS — E119 Type 2 diabetes mellitus without complications: Secondary | ICD-10-CM | POA: Diagnosis not present

## 2018-03-07 DIAGNOSIS — R110 Nausea: Secondary | ICD-10-CM | POA: Diagnosis not present

## 2018-03-07 DIAGNOSIS — E039 Hypothyroidism, unspecified: Secondary | ICD-10-CM | POA: Insufficient documentation

## 2018-03-07 DIAGNOSIS — Z91040 Latex allergy status: Secondary | ICD-10-CM | POA: Insufficient documentation

## 2018-03-07 DIAGNOSIS — Z79899 Other long term (current) drug therapy: Secondary | ICD-10-CM | POA: Diagnosis not present

## 2018-03-07 DIAGNOSIS — K208 Other esophagitis: Secondary | ICD-10-CM | POA: Diagnosis not present

## 2018-03-07 DIAGNOSIS — Z7902 Long term (current) use of antithrombotics/antiplatelets: Secondary | ICD-10-CM | POA: Diagnosis not present

## 2018-03-07 DIAGNOSIS — I2510 Atherosclerotic heart disease of native coronary artery without angina pectoris: Secondary | ICD-10-CM | POA: Insufficient documentation

## 2018-03-07 DIAGNOSIS — J45909 Unspecified asthma, uncomplicated: Secondary | ICD-10-CM | POA: Insufficient documentation

## 2018-03-07 DIAGNOSIS — T50905A Adverse effect of unspecified drugs, medicaments and biological substances, initial encounter: Secondary | ICD-10-CM

## 2018-03-07 DIAGNOSIS — Z7901 Long term (current) use of anticoagulants: Secondary | ICD-10-CM | POA: Diagnosis not present

## 2018-03-07 LAB — BASIC METABOLIC PANEL
ANION GAP: 12 (ref 5–15)
BUN: 22 mg/dL — AB (ref 6–20)
CHLORIDE: 100 mmol/L — AB (ref 101–111)
CO2: 25 mmol/L (ref 22–32)
Calcium: 10.1 mg/dL (ref 8.9–10.3)
Creatinine, Ser: 1.12 mg/dL — ABNORMAL HIGH (ref 0.44–1.00)
GFR, EST AFRICAN AMERICAN: 55 mL/min — AB (ref >60)
GFR, EST NON AFRICAN AMERICAN: 48 mL/min — AB (ref >60)
Glucose, Bld: 154 mg/dL — ABNORMAL HIGH (ref 65–99)
POTASSIUM: 3.6 mmol/L (ref 3.5–5.1)
Sodium: 137 mmol/L (ref 135–145)

## 2018-03-07 LAB — CBC WITH DIFFERENTIAL/PLATELET
BASOS ABS: 0 10*3/uL (ref 0.0–0.1)
BASOS PCT: 0 %
Eosinophils Absolute: 0.8 10*3/uL — ABNORMAL HIGH (ref 0.0–0.7)
Eosinophils Relative: 7 %
HEMATOCRIT: 36.5 % (ref 36.0–46.0)
HEMOGLOBIN: 12.5 g/dL (ref 12.0–15.0)
LYMPHS PCT: 21 %
Lymphs Abs: 2.4 10*3/uL (ref 0.7–4.0)
MCH: 30 pg (ref 26.0–34.0)
MCHC: 34.2 g/dL (ref 30.0–36.0)
MCV: 87.5 fL (ref 78.0–100.0)
Monocytes Absolute: 1 10*3/uL (ref 0.1–1.0)
Monocytes Relative: 9 %
NEUTROS ABS: 7.3 10*3/uL (ref 1.7–7.7)
NEUTROS PCT: 63 %
Platelets: 326 10*3/uL (ref 150–400)
RBC: 4.17 MIL/uL (ref 3.87–5.11)
RDW: 13.8 % (ref 11.5–15.5)
WBC: 11.6 10*3/uL — AB (ref 4.0–10.5)

## 2018-03-07 LAB — TROPONIN I

## 2018-03-07 NOTE — Discharge Instructions (Addendum)
Your evaluated in the emergency department for chest pain in the setting of having a pill stuck in your esophagus from yesterday.  There is no evidence of any cardiac injury.  Your chest x-ray was also unremarkable.  We recommend Maalox as needed if any recurrence of the pain.  If you have any worsening of your symptoms please return to the emergency department.

## 2018-03-07 NOTE — ED Triage Notes (Signed)
Pt reports that she swallowed a large pill yesterday and it felt like it got stuck in her central chest (that resolved) had similar incident this afternoon after eating (that resolved after about 35 minutes). She has had some nausea and elevated blood pressure that improved after the chest discomfort subsided.  No meds PTA.

## 2018-03-07 NOTE — ED Provider Notes (Signed)
[facility] EMERGENCY DEPARTMENT Provider Note   CSN: [id number] Arrival date & time: 03/07/18  2100     History   Chief Complaint Chief Complaint  Patient presents with  . Chest Pain    HPI Tara Campbell is a 72 y.o. female.  72 year old female with a history of single-vessel disease status post 2 stents into her RCA.  She states that last night she had her atorvastatin pill lodged in her lower esophagus for about 20 minutes.  It eventually passed and she had no further discomfort.  She has been eating and drinking okay during the day today but tonight with every sip of drink and  every bite of food she experienced a sharp stabbing substernal pain that lasted about 20 minutes total.  That discomfort is now completely resolved but she wanted to be evaluated to make sure that this was not her heart.  The pain was associated with some nausea but otherwise there was no shortness of breath or diaphoresis or lightheadedness.  The chest pain that she had that was her cardiac presentation was pain in her back.  She had none of that during this episode either.  She states she had a stress test about a year ago that was normal.  The history is provided by the patient.  Chest Pain   This is a new problem. The current episode started 3 to 5 hours ago. The problem has been resolved. The pain is associated with eating. The pain is present in the substernal region. The pain is moderate. The quality of the pain is described as sharp. The pain does not radiate. Associated symptoms include nausea. Pertinent negatives include no abdominal pain, no back pain, no cough, no diaphoresis, no fever, no hemoptysis, no palpitations, no shortness of breath, no syncope and no vomiting. She has tried nothing for the symptoms. The treatment provided no relief.  Pertinent negatives for past medical history include no seizures.    Past Medical History:  Diagnosis Date  . Allergy   . Asthma   . Colon polyps    . Diabetes mellitus without complication (HCC)   . Gout   . Heart attack (HCC) 07/30/2016   PCI, 2 stents  . Hyperlipidemia   . Hypertension   . Internal hemorrhoids   . Left rotator cuff tear   . 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 Otomycosis of right ear 09/27/2017  . Paroxysmal atrial fibrillation (HCC)   . Thyroid disease     Patient Active Problem List   Diagnosis Date Noted  . Encounter for weight loss counseling 03/06/2018  . Uncontrolled type 2 diabetes mellitus with hyperglycemia (HCC) 12/27/2017  . Class 1 obesity due to excess calories with serious comorbidity in adult 12/27/2017  . 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 Idiopathic chronic gout 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 11/28/2017  . Encounter for long-term (current) use of medications 11/28/2017  . Anticoagulant long-term use 10/21/2017  . Sensorineural hearing loss (SNHL) of both ears 09/27/2017  . Hypertension goal BP (blood pressure) < 140/90 08/29/2017  . Coronary artery disease involving native coronary artery of native heart without angina pectoris 08/29/2017  . Need for shingles vaccine 05/31/2017  . Encounter for monitoring statin therapy 05/29/2017  . Blurred vision 05/29/2017  . Hearing difficulty of both ears 05/29/2017  . Trochanteric bursitis, right hip 05/29/2017  . Gout of ankle 04/08/2017  . History of myocardial infarct at age greater than 60 years 01/23/2017  . Mixed diabetic hyperlipidemia  associated with type 2 diabetes mellitus (HCC) 01/23/2017  . Acquired hypothyroidism 01/23/2017  . Moderate persistent asthma 01/16/2017  . Paroxysmal atrial fibrillation (HCC) 01/16/2017  . Cardiomegaly 01/16/2017    Past Surgical History:  Procedure Laterality Date  . ANGIOPLASTY    . COLONOSCOPY W/ POLYPECTOMY    . HEMORRHOID BANDING       OB History   None      Home Medications    Prior to Admission medications   Medication Sig Start Date End Date Taking? Authorizing Provider  albuterol  (PROVENTIL HFA;VENTOLIN HFA) 108 (90 Base) MCG/ACT inhaler Inhale 2 puffs into the lungs every 6 (six) hours as needed for wheezing. 08/29/17   Carlis Stable, PA-C  allopurinol (ZYLOPRIM) 300 MG tablet Take 1 tablet (300 mg total) by mouth 2 (two) times daily. 08/23/17   Monica Becton, MD  apixaban (ELIQUIS) 5 MG TABS tablet Take 1 tablet (5 mg total) by mouth 2 (two) times daily. 01/23/17   Carlis Stable, PA-C  atenolol (TENORMIN) 100 MG tablet TAKE 1 TABLET BY MOUTH  DAILY 12/05/17   Carlis Stable, PA-C  atorvastatin (LIPITOR) 80 MG tablet Take 1 tablet (80 mg total) by mouth daily. 05/31/17   Carlis Stable, PA-C  Cholecalciferol (VITAMIN D3 PO) Take 5,000 mg by mouth daily.    [provider]  clopidogrel (PLAVIX) 75 MG tablet TAKE 1 TABLET BY MOUTH  DAILY 12/05/17   Carlis Stable, PA-C  Cyanocobalamin (B-12 PO) Take 1,000 mg by mouth daily.    [provider]  Dulaglutide (TRULICITY) 0.75 MG/0.5ML SOPN Inject 0.5 mLs into the skin once a week. 03/06/18   Carlis Stable, PA-C  empagliflozin (JARDIANCE) 25 MG TABS tablet Take 25 mg by mouth daily. 01/08/18   Carlis Stable, PA-C  fluticasone furoate-vilanterol (BREO ELLIPTA) 100-25 MCG/INH AEPB Inhale 1 puff into the lungs daily. 08/29/17   Carlis Stable, PA-C  ipratropium-albuterol (DUONEB) 0.5-2.5 (3) MG/3ML SOLN Take 3 mLs by nebulization every 4 (four) hours as needed. 02/27/18   Carlis Stable, PA-C  irbesartan-hydrochlorothiazide (AVALIDE) 300-12.5 MG tablet Take 1 tablet by mouth daily. 08/29/17   Carlis Stable, PA-C  levothyroxine (SYNTHROID, LEVOTHROID) 112 MCG tablet Take 1 tablet (112 mcg total) by mouth daily before breakfast. 08/29/17   Carlis Stable, PA-C  NEBULIZER SYSTEM ALL-IN-ONE MISC 1 Device by Does not apply route every 4 (four) hours as needed. 02/27/18   Carlis Stable, PA-C  Omega-3 Fatty Acids (FISH OIL PO) Take 5,000 Units by mouth daily.    [provider]  sitaGLIPtin-metformin (JANUMET) 50-1000 MG tablet Take 1 tablet by mouth 2 (two) times daily with a meal. 03/06/18   Carlis Stable, PA-C    Family History Family History  Problem Relation Age of Onset  . Hypertension Mother   . Hyperlipidemia Mother   . Glaucoma Father   . Heart disease Father   . Hypertension Father   . Diabetes Father   . Hyperlipidemia Father   . Skin cancer Father   . Diabetes Paternal Grandmother     Social History Social History   Tobacco Use  . Smoking status: Never Smoker  . Smokeless tobacco: Never Used  Substance Use Topics  . Alcohol use: Yes    Comment: Rare  . Drug use: No     Allergies   Latex and Sulfa antibiotics   Review of Systems Review of Systems  Constitutional: Negative for chills,  diaphoresis and fever.  HENT: Negative for ear pain and sore throat.   Eyes: Negative for pain and visual disturbance.  Respiratory: Negative for cough, hemoptysis and shortness of breath.   Cardiovascular: Positive for chest pain. Negative for palpitations and syncope.  Gastrointestinal: Positive for nausea. Negative for abdominal pain and vomiting.  Genitourinary: Negative for dysuria and hematuria.  Musculoskeletal: Negative for arthralgias and back pain.  Skin: Negative for color change and rash.  Neurological: Negative for seizures and syncope.  All other systems reviewed and are negative.    Physical Exam Updated Vital Signs BP (!) 143/67 (BP Location: Right Arm)   Pulse 89   Temp 98.4 F (36.9 C) (Oral)   Resp (!) 24   SpO2 97%   Physical Exam  Constitutional: She appears well-developed and well-nourished. No distress.  HENT:  Head: Normocephalic and atraumatic.  Eyes: Conjunctivae are normal.  Neck: Neck supple.  Cardiovascular: Normal rate and regular rhythm.  No murmur heard. Pulmonary/Chest:  Effort normal and breath sounds normal. No respiratory distress.  Abdominal: Soft. There is no tenderness.  Musculoskeletal: She exhibits no edema.       Right lower leg: Normal. She exhibits no tenderness.       Left lower leg: Normal. She exhibits no tenderness.  Neurological: She is alert.  Skin: Skin is warm and dry. Capillary refill takes less than 2 seconds.  Psychiatric: She has a normal mood and affect.  Nursing note and vitals reviewed.    ED Treatments / Results  Labs (all labs ordered are listed, but only abnormal results are displayed) Labs Reviewed  BASIC METABOLIC PANEL - Abnormal; Notable for the following components:      Result Value   Chloride 100 (*)    Glucose, Bld 154 (*)    BUN 22 (*)    Creatinine, Ser 1.12 (*)    GFR calc non Af Amer 48 (*)    GFR calc Af Amer 55 (*)    All other components within normal limits  CBC WITH DIFFERENTIAL/PLATELET - Abnormal; Notable for the following components:   WBC 11.6 (*)    Eosinophils Absolute 0.8 (*)    All other components within normal limits  TROPONIN I    EKG EKG Interpretation  Date/Time:  Friday March 07 2018 21:08:50 EDT Ventricular Rate:  89 PR Interval:    QRS Duration: 88 QT Interval:  356 QTC Calculation: 434 R Axis:   72 Text Interpretation:  Sinus or ectopic atrial rhythm Low voltage, precordial leads no acute st/ts no prior to compare with Confirmed by Meridee Score [phone]) on 03/07/2018 9:32:15 PM   Radiology Dg Chest 2 View  Result Date: 03/07/2018 CLINICAL DATA:  Acute onset mid chest pain x2 hours EXAM: CHEST - 2 VIEW COMPARISON:  10/16/2017 FINDINGS: The heart size and mediastinal contours are within normal limits. Both lungs are clear. Minimal atelectasis noted at the left lung base. The visualized skeletal structures are unremarkable. IMPRESSION: No active cardiopulmonary disease. Electronically Signed   By: Tollie Eth M.D.   On: 03/07/2018 22:44    Procedures Procedures (including  critical care time)  Medications Ordered in ED Medications - No data to display   Initial Impression / Assessment and Plan / ED Course  I have reviewed the triage vital signs and the nursing notes.  Pertinent labs & imaging results that were available during my care of the patient were reviewed by me and considered in my medical decision making (see chart for  details).  Clinical Course as of Mar 09 1103  Fri Mar 07, 2018  2136 Differential diagnosis includes ACS, esophageal peripheral, pill esophagitis, reflux, pneumonia.  Her initial EKG is nonspecific and there is no prior available.  She is having no symptoms now.   [MB]    Clinical Course User Index [MB] Terrilee Files, MD     Final Clinical Impressions(s) / ED Diagnoses   Final diagnoses:  Atypical chest pain  Pill esophagitis    ED Discharge Orders    None       Terrilee Files, MD 03/08/18 1105

## 2018-03-10 ENCOUNTER — Encounter: Payer: Self-pay | Admitting: Physician Assistant

## 2018-03-11 ENCOUNTER — Other Ambulatory Visit: Payer: Self-pay | Admitting: Physician Assistant

## 2018-03-11 DIAGNOSIS — I2510 Atherosclerotic heart disease of native coronary artery without angina pectoris: Secondary | ICD-10-CM

## 2018-03-11 DIAGNOSIS — R1310 Dysphagia, unspecified: Secondary | ICD-10-CM

## 2018-03-11 MED ORDER — NITROGLYCERIN 0.4 MG SL SUBL: 0.4000 mg | SUBLINGUAL_TABLET | SUBLINGUAL | 0 refills | Status: DC | PRN

## 2018-03-27 ENCOUNTER — Ambulatory Visit: Payer: Commercial Managed Care - HMO | Admitting: Physician Assistant

## 2018-04-03 ENCOUNTER — Encounter: Payer: Self-pay | Admitting: Physician Assistant

## 2018-04-03 ENCOUNTER — Ambulatory Visit (INDEPENDENT_AMBULATORY_CARE_PROVIDER_SITE_OTHER): Payer: Medicare HMO | Admitting: Physician Assistant

## 2018-04-03 VITALS — BP 96/53 | HR 79 | Wt 191.0 lb

## 2018-04-03 DIAGNOSIS — E6609 Other obesity due to excess calories: Secondary | ICD-10-CM | POA: Diagnosis not present

## 2018-04-03 DIAGNOSIS — E1165 Type 2 diabetes mellitus with hyperglycemia: Secondary | ICD-10-CM

## 2018-04-03 DIAGNOSIS — Z713 Dietary counseling and surveillance: Secondary | ICD-10-CM | POA: Diagnosis not present

## 2018-04-03 LAB — POCT GLYCOSYLATED HEMOGLOBIN (HGB A1C): Hemoglobin A1C: 8.6

## 2018-04-03 MED ORDER — METFORMIN HCL 1000 MG PO TABS: 1000.0000 mg | ORAL_TABLET | Freq: Two times a day (BID) | ORAL | 3 refills | Status: DC

## 2018-04-03 MED ORDER — BLOOD GLUCOSE MONITOR KIT: PACK | 0 refills | Status: DC

## 2018-04-03 NOTE — Progress Notes (Signed)
HPI:                                                                Tara Campbell is a 73 y.o. female who presents to [facility] today for diabetes follow-up  DMII:  Last A1c 8.3, 4 months ago, which had worsened due to discontinuation of Metformin at the advice of her cardiologist. switched to [place] and Trulicity 1 month ago and continued on Jardiance for CVD. Unable to fill Trulicity due to mail order pharmacy issue. She is currently taking Metformin, Januvia and Jardiance. Compliant with medications. Denies hypoglycemic events. Denies polydipsia, polyuria, polyphagia. Denies blurred vision or vision change. Denies extremity pain, altered sensation and paresthesias.  Denies ulcers/wounds on feet. Diet: good, no sweetened beverages, fresh fruits/veggies, eats 2 meals per day Exercise: home exercises and walking 3-4 days per week  Weight gain/Obesity: current weight 191 pounds, lost 2 pounds. She has reduced starches and is eating more fresh vegetables. Has cut back on evening snacking and has celery if she is hungry. Reports gradual weight gain in post-menopausal period. Highest adult weight 220, lowest healthy adult weight 150 Lb (>25 years ago). Has been at current weight of 188-195 for the last 2 years. When asked about health goals she states "would like to lose 25 pounds." Previous weight loss attempts: calorie restriction and increased physical activity She met with an RD on 01/28/18, but did not find the visit to be particularly helpful. Barriers: reports struggling w/binge eating, carbohydrates, portion control Co-morbidities: hypothyroidism, Type 2 diabetes   Depression screen [facility] 2/9 01/28/2018 05/29/2017  Decreased Interest 0 0  Down, Depressed, Hopeless 0 0  PHQ - 2 Score 0 0    No flowsheet data found.    Past Medical History:  Diagnosis Date  . Allergy   . Asthma   . Colon polyps   . Diabetes mellitus without  complication ([place])   . Gout   . Heart attack ([place]) 07/30/2016   PCI, 2 stents  . Hyperlipidemia   . Hypertension   . Internal hemorrhoids   . Left rotator cuff tear   . 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 Otomycosis of right ear 09/27/2017  . Paroxysmal atrial fibrillation (HCC)   . Thyroid disease    Past Surgical History:  Procedure Laterality Date  . ANGIOPLASTY    . COLONOSCOPY W/ POLYPECTOMY    . HEMORRHOID BANDING     Social History   Tobacco Use  . Smoking status: Never Smoker  . Smokeless tobacco: Never Used  Substance Use Topics  . Alcohol use: Yes    Comment: Rare   family history includes Diabetes in her father and paternal grandmother; Glaucoma in her father; Heart disease in her father; Hyperlipidemia in her father and mother; Hypertension in her father and mother; Skin cancer in her father.    ROS: negative except as noted in the HPI  Medications: Current Outpatient Medications  Medication Sig Dispense Refill  . albuterol (PROVENTIL HFA;VENTOLIN HFA) 108 (90 Base) MCG/ACT inhaler Inhale 2 puffs into the lungs every 6 (six) hours as needed for wheezing. 2 Inhaler 1  . allopurinol (ZYLOPRIM) 300 MG tablet Take 1 tablet (300 mg total) by mouth 2 (two) times daily.  60 tablet 3  . apixaban (ELIQUIS) 5 MG TABS tablet Take 1 tablet (5 mg total) by mouth 2 (two) times daily. 60 tablet 2  . atenolol (TENORMIN) 100 MG tablet TAKE 1 TABLET BY MOUTH  DAILY 90 tablet 1  . atorvastatin (LIPITOR) 80 MG tablet Take 1 tablet (80 mg total) by mouth daily. 90 tablet 3  . Cholecalciferol (VITAMIN D3 PO) Take 5,000 mg by mouth daily.    . clopidogrel (PLAVIX) 75 MG tablet TAKE 1 TABLET BY MOUTH  DAILY 90 tablet 1  . Cyanocobalamin (B-12 PO) Take 1,000 mg by mouth daily.    . Dulaglutide (TRULICITY) 0.27 OZ/3.6UY SOPN Inject 0.5 mLs into the skin once a week. 4 pen 11  . empagliflozin (JARDIANCE) 25 MG TABS tablet Take 25 mg by mouth daily. 90 tablet 1  . fluticasone  furoate-vilanterol (BREO ELLIPTA) 100-25 MCG/INH AEPB Inhale 1 puff into the lungs daily. 1 each 2  . ipratropium-albuterol (DUONEB) 0.5-2.5 (3) MG/3ML SOLN Take 3 mLs by nebulization every 4 (four) hours as needed. 3 mL 0  . irbesartan-hydrochlorothiazide (AVALIDE) 300-12.5 MG tablet Take 1 tablet by mouth daily. 90 tablet 1  . levothyroxine (SYNTHROID, LEVOTHROID) 112 MCG tablet Take 1 tablet (112 mcg total) by mouth daily before breakfast. 30 tablet 2  . NEBULIZER SYSTEM ALL-IN-ONE MISC 1 Device by Does not apply route every 4 (four) hours as needed. 1 each 0  . nitroGLYCERIN (NITROSTAT) 0.4 MG SL tablet Place 1 tablet (0.4 mg total) under the tongue every 5 (five) minutes as needed for chest pain (or tightness). 30 tablet 0  . Omega-3 Fatty Acids (FISH OIL PO) Take 5,000 Units by mouth daily.    . blood glucose meter kit and supplies KIT Check morning fasting blood glucose daily and up to 4 times daily as directed 1 each 0  . metFORMIN (GLUCOPHAGE) 1000 MG tablet Take 1 tablet (1,000 mg total) by mouth 2 (two) times daily with a meal. 180 tablet 3   No current facility-administered medications for this visit.    Allergies  Allergen Reactions  . Latex   . Sulfa Antibiotics Rash       Objective:  BP (!) 96/53   Pulse 79   Wt 191 lb (86.6 kg)   BMI 32.79 kg/m  Gen:  alert, not ill-appearing, no distress, appropriate for age, obese female HEENT: head normocephalic without obvious abnormality, conjunctiva and cornea clear, wearing glasses, trachea midline Pulm: Normal work of breathing, normal phonation, clear to auscultation bilaterally, no wheezes, rales or rhonchi CV: Normal rate, regular rhythm, s1 and s2 distinct, no murmurs, clicks or rubs  Neuro: alert and oriented x 3, no tremor MSK: extremities atraumatic, normal gait and station Skin: intact, no rashes on exposed skin, no jaundice, no cyanosis Psych: well-groomed, cooperative, good eye contact, euthymic mood, affect  mood-congruent, speech is articulate, and thought processes clear and goal-directed    No results found for this or any previous visit (from the past 72 hour(s)). No results found.    Assessment and Plan: 72 y.o. female with   Uncontrolled type 2 diabetes mellitus with hyperglycemia ([place]) - Plan: POCT HgB A1C, metFORMIN (GLUCOPHAGE) 1000 MG tablet, blood glucose meter kit and supplies KIT  Encounter for weight loss counseling  Class 1 obesity due to excess calories with serious comorbidity in adult, unspecified BMI  Wt Readings from Last 3 Encounters:  04/03/18 191 lb (86.6 kg)  03/06/18 193 lb (87.5 kg)  02/27/18 193 lb (87.5  kg)    Lab Results  Component Value Date   HGBA1C 8.6 04/03/2018  A1c is increased compared to 1 month ago. I anticipate this will go down with addition of GLP-1. Continue Metformin 1050m bid and Jardiance 25 mg daily BP at goal Continue statin and ARB Immunizations UTD Foot exam UTD Eye exam UTD Applauded on weight loss. This will also improve with Trulicity. She went to one nutritionist consult, but did not find it beneficial. Counseled on low-carbohydrate ADA diet and inceased aerobic activity    Patient education and anticipatory guidance given Patient agrees with treatment plan Follow-up in 3 months or sooner as needed if symptoms worsen or fail to improve  CDarlyne RussianPA-C

## 2018-04-03 NOTE — Patient Instructions (Addendum)
Diabetes Mellitus and Nutrition When you have diabetes (diabetes mellitus), it is very important to have healthy eating habits because your blood sugar (glucose) levels are greatly affected by what you eat and drink. Eating healthy foods in the appropriate amounts, at about the same times every day, can help you:  Control your blood glucose.  Lower your risk of heart disease.  Improve your blood pressure.  Reach or maintain a healthy weight.  Every person with diabetes is different, and each person has different needs for a meal plan. Your health care provider may recommend that you work with a diet and nutrition specialist (dietitian) to make a meal plan that is best for you. Your meal plan may vary depending on factors such as:  The calories you need.  The medicines you take.  Your weight.  Your blood glucose, blood pressure, and cholesterol levels.  Your activity level.  Other health conditions you have, such as heart or kidney disease.  How do carbohydrates affect me? Carbohydrates affect your blood glucose level more than any other type of food. Eating carbohydrates naturally increases the amount of glucose in your blood. Carbohydrate counting is a method for keeping track of how many carbohydrates you eat. Counting carbohydrates is important to keep your blood glucose at a healthy level, especially if you use insulin or take certain oral diabetes medicines. It is important to know how many carbohydrates you can safely have in each meal. This is different for every person. Your dietitian can help you calculate how many carbohydrates you should have at each meal and for snack. Foods that contain carbohydrates include:  Bread, cereal, rice, pasta, and crackers.  Potatoes and corn.  Peas, beans, and lentils.  Milk and yogurt.  Fruit and juice.  Desserts, such as cakes, cookies, ice cream, and candy.  How does alcohol affect me? Alcohol can cause a sudden decrease in blood  glucose (hypoglycemia), especially if you use insulin or take certain oral diabetes medicines. Hypoglycemia can be a life-threatening condition. Symptoms of hypoglycemia (sleepiness, dizziness, and confusion) are similar to symptoms of having too much alcohol. If your health care provider says that alcohol is safe for you, follow these guidelines:  Limit alcohol intake to no more than 1 drink per day for nonpregnant women and 2 drinks per day for men. One drink equals 12 oz of beer, 5 oz of wine, or 1 oz of hard liquor.  Do not drink on an empty stomach.  Keep yourself hydrated with water, diet soda, or unsweetened iced tea.  Keep in mind that regular soda, juice, and other mixers may contain a lot of sugar and must be counted as carbohydrates.  What are tips for following this plan? Reading food labels  Start by checking the serving size on the label. The amount of calories, carbohydrates, fats, and other nutrients listed on the label are based on one serving of the food. Many foods contain more than one serving per package.  Check the total grams (g) of carbohydrates in one serving. You can calculate the number of servings of carbohydrates in one serving by dividing the total carbohydrates by 15. For example, if a food has 30 g of total carbohydrates, it would be equal to 2 servings of carbohydrates.  Check the number of grams (g) of saturated and trans fats in one serving. Choose foods that have low or no amount of these fats.  Check the number of milligrams (mg) of sodium in one serving. Most people   should limit total sodium intake to less than 2,300 mg per day.  Always check the nutrition information of foods labeled as "low-fat" or "nonfat". These foods may be higher in added sugar or refined carbohydrates and should be avoided.  Talk to your dietitian to identify your daily goals for nutrients listed on the label. Shopping  Avoid buying canned, premade, or processed foods. These  foods tend to be high in fat, sodium, and added sugar.  Shop around the outside edge of the grocery store. This includes fresh fruits and vegetables, bulk grains, fresh meats, and fresh dairy. Cooking  Use low-heat cooking methods, such as baking, instead of high-heat cooking methods like deep frying.  Cook using healthy oils, such as olive, canola, or sunflower oil.  Avoid cooking with butter, cream, or high-fat meats. Meal planning  Eat meals and snacks regularly, preferably at the same times every day. Avoid going long periods of time without eating.  Eat foods high in fiber, such as fresh fruits, vegetables, beans, and whole grains. Talk to your dietitian about how many servings of carbohydrates you can eat at each meal.  Eat 4-6 ounces of lean protein each day, such as lean meat, chicken, fish, eggs, or tofu. 1 ounce is equal to 1 ounce of meat, chicken, or fish, 1 egg, or 1/4 cup of tofu.  Eat some foods each day that contain healthy fats, such as avocado, nuts, seeds, and fish. Lifestyle   Check your blood glucose regularly.  Exercise at least 30 minutes 5 or more days each week, or as told by your health care provider.  Take medicines as told by your health care provider.  Do not use any products that contain nicotine or tobacco, such as cigarettes and e-cigarettes. If you need help quitting, ask your health care provider.  Work with a Social worker or diabetes educator to identify strategies to manage stress and any emotional and social challenges. What are some questions to ask my health care provider?  Do I need to meet with a diabetes educator?  Do I need to meet with a dietitian?  What number can I call if I have questions?  When are the best times to check my blood glucose? Where to find more information:  American Diabetes Association: diabetes.org/food-and-fitness/food  Academy of Nutrition and Dietetics:  PokerClues.dk  [employer] of Diabetes and Digestive and Kidney Diseases (NIH): ContactWire.be Summary  A healthy meal plan will help you control your blood glucose and maintain a healthy lifestyle.  Working with a diet and nutrition specialist (dietitian) can help you make a meal plan that is best for you.  Keep in mind that carbohydrates and alcohol have immediate effects on your blood glucose levels. It is important to count carbohydrates and to use alcohol carefully. This information is not intended to replace advice given to you by your health care provider. Make sure you discuss any questions you have with your health care provider. Document Released: 08/16/2005 Document Revised: 12/24/2016 Document Reviewed: 12/24/2016 Elsevier Interactive Patient Education  2018 [employer].   Carbohydrate Counting for Diabetes Mellitus, Adult Carbohydrate counting is a method for keeping track of how many carbohydrates you eat. Eating carbohydrates naturally increases the amount of sugar (glucose) in the blood. Counting how many carbohydrates you eat helps keep your blood glucose within normal limits, which helps you manage your diabetes (diabetes mellitus). It is important to know how many carbohydrates you can safely have in each meal. This is different for every person.  A diet and nutrition specialist (registered dietitian) can help you make a meal plan and calculate how many carbohydrates you should have at each meal and snack. Carbohydrates are found in the following foods:  Grains, such as breads and cereals.  Dried beans and soy products.  Starchy vegetables, such as potatoes, peas, and corn.  Fruit and fruit juices.  Milk and yogurt.  Sweets and snack foods, such as cake, cookies, candy, chips, and soft drinks.  How do I count carbohydrates? There are  two ways to count carbohydrates in food. You can use either of the methods or a combination of both. Reading "Nutrition Facts" on packaged food The "Nutrition Facts" list is included on the labels of almost all packaged foods and beverages in the [place] It includes:  The serving size.  Information about nutrients in each serving, including the grams (g) of carbohydrate per serving.  To use the "Nutrition Facts":  Decide how many servings you will have.  Multiply the number of servings by the number of carbohydrates per serving.  The resulting number is the total amount of carbohydrates that you will be having.  Learning standard serving sizes of other foods When you eat foods containing carbohydrates that are not packaged or do not include "Nutrition Facts" on the label, you need to measure the servings in order to count the amount of carbohydrates:  Measure the foods that you will eat with a food scale or measuring cup, if needed.  Decide how many standard-size servings you will eat.  Multiply the number of servings by 15. Most carbohydrate-rich foods have about 15 g of carbohydrates per serving. ? For example, if you eat 8 oz (170 g) of strawberries, you will have eaten 2 servings and 30 g of carbohydrates (2 servings x 15 g = 30 g).  For foods that have more than one food mixed, such as soups and casseroles, you must count the carbohydrates in each food that is included.  The following list contains standard serving sizes of common carbohydrate-rich foods. Each of these servings has about 15 g of carbohydrates:   hamburger bun or  English muffin.   oz (15 mL) syrup.   oz (14 g) jelly.  1 slice of bread.  1 six-inch tortilla.  3 oz (85 g) cooked rice or pasta.  4 oz (113 g) cooked dried beans.  4 oz (113 g) starchy vegetable, such as peas, corn, or potatoes.  4 oz (113 g) hot cereal.  4 oz (113 g) mashed potatoes or  of a large baked potato.  4 oz (113 g) canned  or frozen fruit.  4 oz (120 mL) fruit juice.  4-6 crackers.  6 chicken nuggets.  6 oz (170 g) unsweetened dry cereal.  6 oz (170 g) plain fat-free yogurt or yogurt sweetened with artificial sweeteners.  8 oz (240 mL) milk.  8 oz (170 g) fresh fruit or one small piece of fruit.  24 oz (680 g) popped popcorn.  Example of carbohydrate counting Sample meal  3 oz (85 g) chicken breast.  6 oz (170 g) brown rice.  4 oz (113 g) corn.  8 oz (240 mL) milk.  8 oz (170 g) strawberries with sugar-free whipped topping. Carbohydrate calculation 1. Identify the foods that contain carbohydrates: ? Rice. ? Corn. ? Milk. ? Strawberries. 2. Calculate how many servings you have of each food: ? 2 servings rice. ? 1 serving corn. ? 1 serving milk. ? 1 serving strawberries. 3. Multiply  each number of servings by 15 g: ? 2 servings rice x 15 g = 30 g. ? 1 serving corn x 15 g = 15 g. ? 1 serving milk x 15 g = 15 g. ? 1 serving strawberries x 15 g = 15 g. 4. Add together all of the amounts to find the total grams of carbohydrates eaten: ? 30 g + 15 g + 15 g + 15 g = 75 g of carbohydrates total. This information is not intended to replace advice given to you by your health care provider. Make sure you discuss any questions you have with your health care provider. Document Released: 11/19/2005 Document Revised: 06/08/2016 Document Reviewed: 05/02/2016 Elsevier Interactive Patient Education  [employer].

## 2018-04-07 ENCOUNTER — Encounter: Payer: Self-pay | Admitting: Physician Assistant

## 2018-04-07 ENCOUNTER — Ambulatory Visit: Payer: Medicare HMO | Admitting: Registered"

## 2018-04-16 ENCOUNTER — Encounter: Payer: Self-pay | Admitting: Physician Assistant

## 2018-04-16 ENCOUNTER — Ambulatory Visit (INDEPENDENT_AMBULATORY_CARE_PROVIDER_SITE_OTHER): Payer: Medicare HMO | Admitting: Physician Assistant

## 2018-04-16 VITALS — BP 120/72 | HR 97 | Wt 190.0 lb

## 2018-04-16 DIAGNOSIS — S91332A Puncture wound without foreign body, left foot, initial encounter: Secondary | ICD-10-CM

## 2018-04-16 MED ORDER — CEPHALEXIN 500 MG PO CAPS: 500.0000 mg | ORAL_CAPSULE | Freq: Two times a day (BID) | ORAL | 0 refills | Status: DC

## 2018-04-16 NOTE — Progress Notes (Signed)
HPI:                                                                Tara Campbell is a 72 y.o. female who presents to [facility]: [place] today for left foot injury  HPI  Stepped on an unknown foreign body while barefoot in her bathroom 2 days ago. She noted a small cut and bleeding on the plantar aspect of her left foot. Bleeding was easily controlled. She did not remove any foreign body. Since then there has been some swelling and mild pain. No fever, redness, bleeding or drainage. Able to weight bear.  No flowsheet data found.    Past Medical History:  Diagnosis Date  . Allergy   . Asthma   . Colon polyps   . Diabetes mellitus without complication ([place])   . Gout   . Heart attack ([place]) 07/30/2016   PCI, 2 stents  . Hyperlipidemia   . Hypertension   . Internal hemorrhoids   . Left rotator cuff tear   . 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 Otomycosis of right ear 09/27/2017  . Paroxysmal atrial fibrillation ([facility])   . Thyroid disease    Past Surgical History:  Procedure Laterality Date  . ANGIOPLASTY    . COLONOSCOPY W/ POLYPECTOMY    . HEMORRHOID BANDING     Social History   Tobacco Use  . Smoking status: Never Smoker  . Smokeless tobacco: Never Used  Substance Use Topics  . Alcohol use: Yes    Comment: Rare   family history includes Diabetes in her father and paternal grandmother; Glaucoma in her father; Heart disease in her father; Hyperlipidemia in her father and mother; Hypertension in her father and mother; Skin cancer in her father.    ROS: negative except as noted in the HPI  Medications: Current Outpatient Medications  Medication Sig Dispense Refill  . albuterol (PROVENTIL HFA;VENTOLIN HFA) 108 (90 Base) MCG/ACT inhaler Inhale 2 puffs into the lungs every 6 (six) hours as needed for wheezing. 2 Inhaler 1  . allopurinol (ZYLOPRIM) 300 MG tablet Take 1 tablet (300 mg total) by mouth 2 (two) times daily. 60 tablet  3  . apixaban (ELIQUIS) 5 MG TABS tablet Take 1 tablet (5 mg total) by mouth 2 (two) times daily. 60 tablet 2  . atenolol (TENORMIN) 100 MG tablet TAKE 1 TABLET BY MOUTH  DAILY 90 tablet 1  . atorvastatin (LIPITOR) 80 MG tablet Take 1 tablet (80 mg total) by mouth daily. 90 tablet 3  . blood glucose meter kit and supplies KIT Check morning fasting blood glucose daily and up to 4 times daily as directed 1 each 0  . Cholecalciferol (VITAMIN D3 PO) Take 5,000 mg by mouth daily.    . clopidogrel (PLAVIX) 75 MG tablet TAKE 1 TABLET BY MOUTH  DAILY 90 tablet 1  . Cyanocobalamin (B-12 PO) Take 1,000 mg by mouth daily.    . Dulaglutide (TRULICITY) 7.12 RF/7.5OI SOPN Inject 0.5 mLs into the skin once a week. 4 pen 11  . empagliflozin (JARDIANCE) 25 MG TABS tablet Take 25 mg by mouth daily. 90 tablet 1  . fluticasone furoate-vilanterol (BREO ELLIPTA) 100-25 MCG/INH AEPB Inhale 1 puff into the lungs daily. 1 each  2  . ipratropium-albuterol (DUONEB) 0.5-2.5 (3) MG/3ML SOLN Take 3 mLs by nebulization every 4 (four) hours as needed. 3 mL 0  . irbesartan-hydrochlorothiazide (AVALIDE) 300-12.5 MG tablet Take 1 tablet by mouth daily. 90 tablet 1  . levothyroxine (SYNTHROID, LEVOTHROID) 112 MCG tablet Take 1 tablet (112 mcg total) by mouth daily before breakfast. 30 tablet 2  . metFORMIN (GLUCOPHAGE) 1000 MG tablet Take 1 tablet (1,000 mg total) by mouth 2 (two) times daily with a meal. 180 tablet 3  . NEBULIZER SYSTEM ALL-IN-ONE MISC 1 Device by Does not apply route every 4 (four) hours as needed. 1 each 0  . nitroGLYCERIN (NITROSTAT) 0.4 MG SL tablet Place 1 tablet (0.4 mg total) under the tongue every 5 (five) minutes as needed for chest pain (or tightness). 30 tablet 0  . Omega-3 Fatty Acids (FISH OIL PO) Take 5,000 Units by mouth daily.    . cephALEXin (KEFLEX) 500 MG capsule Take 1 capsule (500 mg total) by mouth 2 (two) times daily. 14 capsule 0   No current facility-administered medications for this visit.     Allergies  Allergen Reactions  . Latex   . Sulfa Antibiotics Rash       Objective:  BP 120/72   Pulse 97   Wt 190 lb (86.2 kg)   BMI 32.61 kg/m  Gen:  alert, not ill-appearing, no distress, appropriate for age 30: head normocephalic without obvious abnormality, conjunctiva and cornea clear, trachea midline Pulm: Normal work of breathing, normal phonation MSK: extremities atraumatic, normal gait and station Skin:there is approximately 3 mm palpable nodule with tiny 38m superficial puncture wound on plantar aspect of left foot between 2nd-3rd metatarsal head, no streaking redness, no drainage, no visible foreign body Psych: well-groomed, cooperative, good eye contact, euthymic mood, affect mood-congruent, speech is articulate, and thought processes clear and goal-directed    No results found for this or any previous visit (from the past 72 hour(s)). No results found.    Assessment and Plan: 72y.o. female with   Puncture wound of left foot, initial encounter - Plan: cephALEXin (KEFLEX) 500 MG capsule - no visible foreign body or evidence of infection on exam - Tdap UTD - due to diabetes and unknown mechanism of injury, will cover with Keflex. If symptoms worsen, would recommend surgical exploration under local anesthesia to assess for foreign body   Patient education and anticipatory guidance given Patient agrees with treatment plan Follow-up as needed if symptoms worsen or fail to improve  CDarlyne RussianPA-C

## 2018-04-16 NOTE — Patient Instructions (Signed)

## 2018-05-29 ENCOUNTER — Ambulatory Visit: Payer: Medicare Other | Admitting: Physician Assistant

## 2018-07-04 ENCOUNTER — Ambulatory Visit: Payer: Medicare HMO | Admitting: Physician Assistant

## 2018-07-10 NOTE — Telephone Encounter (Signed)
closed

## 2018-07-18 ENCOUNTER — Ambulatory Visit: Payer: Medicare HMO | Admitting: Physician Assistant

## 2018-07-21 ENCOUNTER — Ambulatory Visit: Payer: Medicare HMO | Admitting: Physician Assistant

## 2018-07-24 ENCOUNTER — Ambulatory Visit (INDEPENDENT_AMBULATORY_CARE_PROVIDER_SITE_OTHER): Payer: Medicare HMO | Admitting: Physician Assistant

## 2018-07-24 ENCOUNTER — Encounter: Payer: Self-pay | Admitting: Physician Assistant

## 2018-07-24 VITALS — BP 139/77 | HR 84 | Wt 188.0 lb

## 2018-07-24 DIAGNOSIS — Z1231 Encounter for screening mammogram for malignant neoplasm of breast: Secondary | ICD-10-CM

## 2018-07-24 DIAGNOSIS — I2510 Atherosclerotic heart disease of native coronary artery without angina pectoris: Secondary | ICD-10-CM

## 2018-07-24 DIAGNOSIS — M1A0790 Idiopathic chronic gout, unspecified ankle and foot, without tophus (tophi): Secondary | ICD-10-CM | POA: Diagnosis not present

## 2018-07-24 DIAGNOSIS — N289 Disorder of kidney and ureter, unspecified: Secondary | ICD-10-CM

## 2018-07-24 DIAGNOSIS — E119 Type 2 diabetes mellitus without complications: Secondary | ICD-10-CM | POA: Diagnosis not present

## 2018-07-24 DIAGNOSIS — E1169 Type 2 diabetes mellitus with other specified complication: Secondary | ICD-10-CM

## 2018-07-24 DIAGNOSIS — I252 Old myocardial infarction: Secondary | ICD-10-CM

## 2018-07-24 DIAGNOSIS — Z5181 Encounter for therapeutic drug level monitoring: Secondary | ICD-10-CM

## 2018-07-24 DIAGNOSIS — M75102 Unspecified rotator cuff tear or rupture of left shoulder, not specified as traumatic: Secondary | ICD-10-CM

## 2018-07-24 DIAGNOSIS — R7989 Other specified abnormal findings of blood chemistry: Secondary | ICD-10-CM

## 2018-07-24 DIAGNOSIS — I10 Essential (primary) hypertension: Secondary | ICD-10-CM

## 2018-07-24 DIAGNOSIS — G8929 Other chronic pain: Secondary | ICD-10-CM

## 2018-07-24 DIAGNOSIS — E1165 Type 2 diabetes mellitus with hyperglycemia: Secondary | ICD-10-CM

## 2018-07-24 DIAGNOSIS — E785 Hyperlipidemia, unspecified: Secondary | ICD-10-CM

## 2018-07-24 DIAGNOSIS — Z13820 Encounter for screening for osteoporosis: Secondary | ICD-10-CM

## 2018-07-24 DIAGNOSIS — M12812 Other specific arthropathies, not elsewhere classified, left shoulder: Secondary | ICD-10-CM

## 2018-07-24 DIAGNOSIS — Z1389 Encounter for screening for other disorder: Secondary | ICD-10-CM

## 2018-07-24 DIAGNOSIS — Z780 Asymptomatic menopausal state: Secondary | ICD-10-CM

## 2018-07-24 DIAGNOSIS — E039 Hypothyroidism, unspecified: Secondary | ICD-10-CM

## 2018-07-24 DIAGNOSIS — M25551 Pain in right hip: Secondary | ICD-10-CM

## 2018-07-24 DIAGNOSIS — R590 Localized enlarged lymph nodes: Secondary | ICD-10-CM

## 2018-07-24 DIAGNOSIS — I480 Paroxysmal atrial fibrillation: Secondary | ICD-10-CM | POA: Diagnosis not present

## 2018-07-24 DIAGNOSIS — E1159 Type 2 diabetes mellitus with other circulatory complications: Secondary | ICD-10-CM

## 2018-07-24 DIAGNOSIS — D72829 Elevated white blood cell count, unspecified: Secondary | ICD-10-CM

## 2018-07-24 LAB — POCT GLYCOSYLATED HEMOGLOBIN (HGB A1C): HbA1c, POC (controlled diabetic range): 6.9 % (ref 0.0–7.0)

## 2018-07-24 MED ORDER — IRBESARTAN-HYDROCHLOROTHIAZIDE 300-12.5 MG PO TABS: 1.0000 | ORAL_TABLET | Freq: Every day | ORAL | 1 refills | Status: DC

## 2018-07-24 MED ORDER — ATENOLOL 100 MG PO TABS: 100.0000 mg | ORAL_TABLET | Freq: Every day | ORAL | 1 refills | Status: DC

## 2018-07-24 MED ORDER — ALLOPURINOL 300 MG PO TABS: 300.0000 mg | ORAL_TABLET | Freq: Two times a day (BID) | ORAL | 0 refills | Status: DC

## 2018-07-24 MED ORDER — ATORVASTATIN CALCIUM 80 MG PO TABS: 80.0000 mg | ORAL_TABLET | Freq: Every day | ORAL | 1 refills | Status: DC

## 2018-07-24 MED ORDER — LESINURAD 200 MG PO TABS: 200.0000 mg | ORAL_TABLET | Freq: Every day | ORAL | 0 refills | Status: DC

## 2018-07-24 MED ORDER — LEVOTHYROXINE SODIUM 112 MCG PO TABS: 112.0000 ug | ORAL_TABLET | Freq: Every day | ORAL | 1 refills | Status: DC

## 2018-07-24 MED ORDER — EMPAGLIFLOZIN 25 MG PO TABS: 25.0000 mg | ORAL_TABLET | Freq: Every day | ORAL | 1 refills | Status: DC

## 2018-07-24 NOTE — Patient Instructions (Addendum)
Contact Dr. Jens Som for refills of Plavix and Eliquis Keep follow-up with him in February 2020  Mammogram due September 04, 2018. Also ordered a bone density to go along with this  Fasting labs due December 2019  I will contact you regarding any follow-up imaging of your chest for the enlarged lymph nodes  Return for high dose flu vaccine  Diabetes Preventive Care: - annual foot exam  - annual dilated eye exam with an eye doctor - self foot exams at least weekly - pneumonia vaccine once (booster in 5 years and at age 35) - annual influenza vaccine - twice yearly dental cleanings and yearly exam - goal blood pressure <140/90, ideally <130/80 - LDL cholesterol <70 - A1C <7.6 - body mass index (BMI) <25.0 - follow-up every 3 months if your A1C is not at goal - follow-up every 6 months if diabetes is well controlled

## 2018-07-24 NOTE — Progress Notes (Signed)
HPI:                                                                Tara Campbell is a 72 y.o. female who presents to [place]: [place] today for medication management  Gout: left great toe affected. Reports having flares every 2 months. She is on max dose Allopurinol 300 mg twice day.  DMII: Compliant with medications. She was started on Jardiance for CVD risk at last office visit. Denies polydipsia, polyuria, polyphagia. Denies blurred vision or vision change. Denies extremity pain, altered sensation and paresthesias.  Denies ulcers/wounds on feet. Hx of DKA/HHS: never Diabetes associated symptoms: Blood glucose readings: no readings today Hypoglycemia frequency: none Severe hypoglycemia (requiring 3rd party assistance): none  HTN/CAD/Afib: taking Atenolol and Avalide daily. Also followed by Cardiology and on Plavix and Eliquis. Compliant with medications. Does not check BP's at home. Denies vision change, headache, chest pain with exertion, orthopnea, lightheadedness, syncope and edema.    Requesting referral to PT for chronic left shoulder and right hip pain.    Depression screen [facility] 2/9 01/28/2018 05/29/2017  Decreased Interest 0 0  Down, Depressed, Hopeless 0 0  PHQ - 2 Score 0 0    No flowsheet data found.    Past Medical History:  Diagnosis Date  . Allergy   . Asthma   . Colon polyps   . Diabetes mellitus without complication ([place])   . Gout   . Heart attack ([place]) 07/30/2016   PCI, 2 stents  . Hyperlipidemia   . Hypertension   . Internal hemorrhoids   . Left rotator cuff tear   . 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 Otomycosis of right ear 09/27/2017  . Paroxysmal atrial fibrillation (HCC)   . Thyroid disease    Past Surgical History:  Procedure Laterality Date  . ANGIOPLASTY    . COLONOSCOPY W/ POLYPECTOMY    . HEMORRHOID BANDING     Social History   Tobacco Use  . Smoking status: Never Smoker  . Smokeless  tobacco: Never Used  Substance Use Topics  . Alcohol use: Yes    Comment: Rare   family history includes Diabetes in her father and paternal grandmother; Glaucoma in her father; Heart disease in her father; Hyperlipidemia in her father and mother; Hypertension in her father and mother; Skin cancer in her father.    ROS: negative except as noted in the HPI  Medications: Current Outpatient Medications  Medication Sig Dispense Refill  . albuterol (PROVENTIL HFA;VENTOLIN HFA) 108 (90 Base) MCG/ACT inhaler Inhale 2 puffs into the lungs every 6 (six) hours as needed for wheezing. 2 Inhaler 1  . allopurinol (ZYLOPRIM) 300 MG tablet Take 1 tablet (300 mg total) by mouth 2 (two) times daily. 60 tablet 3  . apixaban (ELIQUIS) 5 MG TABS tablet Take 1 tablet (5 mg total) by mouth 2 (two) times daily. 60 tablet 2  . atenolol (TENORMIN) 100 MG tablet TAKE 1 TABLET BY MOUTH  DAILY 90 tablet 1  . atorvastatin (LIPITOR) 80 MG tablet Take 1 tablet (80 mg total) by mouth daily. 90 tablet 3  . blood glucose meter kit and supplies KIT Check morning fasting blood glucose daily and up to 4 times daily as  directed 1 each 0  . cephALEXin (KEFLEX) 500 MG capsule Take 1 capsule (500 mg total) by mouth 2 (two) times daily. 14 capsule 0  . Cholecalciferol (VITAMIN D3 PO) Take 5,000 mg by mouth daily.    . clopidogrel (PLAVIX) 75 MG tablet TAKE 1 TABLET BY MOUTH  DAILY 90 tablet 1  . Cyanocobalamin (B-12 PO) Take 1,000 mg by mouth daily.    . Dulaglutide (TRULICITY) 0.38 UE/2.8MK SOPN Inject 0.5 mLs into the skin once a week. 4 pen 11  . empagliflozin (JARDIANCE) 25 MG TABS tablet Take 25 mg by mouth daily. 90 tablet 1  . fluticasone furoate-vilanterol (BREO ELLIPTA) 100-25 MCG/INH AEPB Inhale 1 puff into the lungs daily. 1 each 2  . ipratropium-albuterol (DUONEB) 0.5-2.5 (3) MG/3ML SOLN Take 3 mLs by nebulization every 4 (four) hours as needed. 3 mL 0  . irbesartan-hydrochlorothiazide (AVALIDE) 300-12.5 MG tablet  Take 1 tablet by mouth daily. 90 tablet 1  . levothyroxine (SYNTHROID, LEVOTHROID) 112 MCG tablet Take 1 tablet (112 mcg total) by mouth daily before breakfast. 30 tablet 2  . metFORMIN (GLUCOPHAGE) 1000 MG tablet Take 1 tablet (1,000 mg total) by mouth 2 (two) times daily with a meal. 180 tablet 3  . NEBULIZER SYSTEM ALL-IN-ONE MISC 1 Device by Does not apply route every 4 (four) hours as needed. 1 each 0  . nitroGLYCERIN (NITROSTAT) 0.4 MG SL tablet Place 1 tablet (0.4 mg total) under the tongue every 5 (five) minutes as needed for chest pain (or tightness). 30 tablet 0  . Omega-3 Fatty Acids (FISH OIL PO) Take 5,000 Units by mouth daily.     No current facility-administered medications for this visit.    Allergies  Allergen Reactions  . Latex   . Sulfa Antibiotics Rash       Objective:  BP 139/77   Pulse 84   Wt 188 lb (85.3 kg)   BMI 32.27 kg/m  Gen:  alert, not ill-appearing, no distress, appropriate for age, obese female HEENT: head normocephalic without obvious abnormality, conjunctiva and cornea clear, wearing glasses, trachea midline Pulm: Normal work of breathing, normal phonation, clear to auscultation bilaterally, no wheezes, rales or rhonchi CV: Normal rate, regular rhythm, s1 and s2 distinct, no murmurs, clicks or rubs  Neuro: alert and oriented x 3, no tremor MSK: extremities atraumatic, normal gait and station Skin: intact, no rashes on exposed skin, no jaundice, no cyanosis Psych: well-groomed, cooperative, good eye contact, euthymic mood, affect mood-congruent, speech is articulate, and thought processes clear and goal-directed    Results for orders placed or performed in visit on 07/24/18 (from the past 72 hour(s))  POCT HgB A1C     Status: None   Collection Time: 07/24/18 11:21 AM  Result Value Ref Range   Hemoglobin A1C     HbA1c POC (<> result, manual entry)     HbA1c, POC (prediabetic range)     HbA1c, POC (controlled diabetic range) 6.9 0.0 - 7.0 %    No results found.    Assessment and Plan: 71 y.o. female with   .[name] was seen today for hyperglycemia.  Diagnoses and all orders for this visit:  Controlled type 2 diabetes mellitus without complication, without long-term current use of insulin (HCC) -     POCT HgB A1C -     empagliflozin (JARDIANCE) 25 MG TABS tablet; Take 25 mg by mouth daily.  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 -     Discontinue: Lesinurad (ZURAMPIC) 200 MG TABS; Take 200 mg by mouth daily with breakfast.  Paroxysmal atrial fibrillation (HCC) -     atenolol (TENORMIN) 100 MG tablet; Take 1 tablet (100 mg total) by mouth daily.  Essential hypertension  History of myocardial infarct at age greater than 72 years  Coronary artery disease involving native coronary artery of native heart without angina pectoris  Acquired hypothyroidism -     levothyroxine (SYNTHROID, LEVOTHROID) 112 MCG tablet; Take 1 tablet (112 mcg total) by mouth daily before breakfast. -     TSH  Hypertension associated with diabetes ([place]) -     irbesartan-hydrochlorothiazide (AVALIDE) 300-12.5 MG tablet; Take 1 tablet by mouth daily. -     COMPLETE METABOLIC PANEL WITH GFR  Breast cancer screening by mammogram -     MM 3D SCREEN BREAST BILATERAL; Future  Hyperlipidemia associated with type 2 diabetes mellitus (HCC) -     atorvastatin (LIPITOR) 80 MG tablet; Take 1 tablet (80 mg total) by mouth at bedtime.  Osteoporosis screening -     DG Bone Density; Future  Postmenopausal -     DG Bone Density; Future  Left rotator cuff tear arthropathy -     Ambulatory referral to Physical Therapy  Chronic right hip pain -     Ambulatory referral to Physical Therapy  Hilar adenopathy -     CT Chest W Contrast; Future  Mediastinal  adenopathy -     CT Chest W Contrast; Future  Encounter for medication monitoring -     CBC with Differential/Platelet -     COMPLETE METABOLIC PANEL WITH GFR -     TSH  Leukocytosis, unspecified type -     CT Chest W Contrast; Future -     CBC with Differential/Platelet  Elevated serum creatinine -     COMPLETE METABOLIC PANEL WITH GFR -     Urinalysis, Routine w reflex microscopic  Screening for blood or protein in urine -     Urinalysis, Routine w reflex microscopic   Gout Lab Results  Component Value Date   LABURIC 6.9 11/28/2017  - uric acid not at goal on max dose Allopurinol, having frequent flares - discussed with Sports physician, who reocmmended adding [place]   Type 2 diabetes Lab Results  Component Value Date   HGBA1C 6.9 07/24/2018  - well controlled - cont Jardiance, Metformin and Trulicity - BP at goal <875/64, cont ACE/thiazide - LDL goal <70, cont statin - eye exam UTD - foot exam UTD  Mediastinal/Hilar Adenopathy In reviewed outside imaging reports from Feb 2018, noted some abnormalities including enlarged mediastinal and bihilar lymph nodes. At the time patient was hospitalized for Afib and asthma exacerbation. She was referred to Pulmonology, but lost to follow-up. I consulted with [facility], who recommended f/u CT chest w/ contrast due to the hilar adenopathy.   Patient education and anticipatory guidance given Patient agrees with treatment plan Follow-up in 3 months or sooner as needed if symptoms worsen or fail to improve  Darlyne Russian PA-C

## 2018-07-25 ENCOUNTER — Encounter: Payer: Self-pay | Admitting: Sports Medicine

## 2018-07-25 DIAGNOSIS — M1A09X0 Idiopathic chronic gout, multiple sites, without tophus (tophi): Secondary | ICD-10-CM

## 2018-07-25 MED ORDER — COLCRYS 0.6 MG PO TABS: 0.6000 mg | ORAL_TABLET | Freq: Every day | ORAL | 3 refills | Status: DC

## 2018-07-29 ENCOUNTER — Encounter: Payer: Self-pay | Admitting: Physical Therapy

## 2018-07-29 ENCOUNTER — Ambulatory Visit: Payer: Medicare HMO | Admitting: Physical Therapy

## 2018-07-29 DIAGNOSIS — G8929 Other chronic pain: Secondary | ICD-10-CM

## 2018-07-29 DIAGNOSIS — M25612 Stiffness of left shoulder, not elsewhere classified: Secondary | ICD-10-CM

## 2018-07-29 DIAGNOSIS — M25512 Pain in left shoulder: Secondary | ICD-10-CM | POA: Diagnosis not present

## 2018-07-29 DIAGNOSIS — M25551 Pain in right hip: Secondary | ICD-10-CM | POA: Diagnosis not present

## 2018-07-29 NOTE — Patient Instructions (Signed)
Access Code: [email or web address]  URL: [email or web address]  Date: 07/29/2018  Prepared by: Moshe Cipro   Exercises  Seated Scapular Retraction - 10 reps - 1 sets - 5 sec hold - 2x daily - 7x weekly  Standing Backward Shoulder Rolls - 10 reps - 1 sets - 2x daily - 7x weekly  Doorway Pec Stretch at 60 Elevation - 3 reps - 1 sets - 30 sec hold - 2x daily - 7x weekly

## 2018-07-29 NOTE — Therapy (Signed)
[facility] [place] [address] [place], [place], [address] Phone: [phone]   Fax:  [phone]  Physical Therapy Evaluation  Patient Details  Name: Tara Campbell MRN: [id number] Date of Birth: [birth year] Referring Provider: Carlis Stable, [place]   Encounter Date: 07/29/2018  PT End of Session - 07/29/18 1136    Visit Number  1    Number of Visits  12    Date for PT Re-Evaluation  09/09/18    Authorization Type  [employer]    PT Start Time  1030    PT Stop Time  1111    PT Time Calculation (min)  41 min    Activity Tolerance  Patient tolerated treatment well    Behavior During Therapy  [facility] for tasks assessed/performed       Past Medical History:  Diagnosis Date  . Allergy   . Asthma   . Colon polyps   . Diabetes mellitus without complication (HCC)   . Gout   . Heart attack (HCC) 07/30/2016   PCI, 2 stents  . Hyperlipidemia   . Hypertension   . Internal hemorrhoids   . Left rotator cuff tear   . 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 Otomycosis of right ear 09/27/2017  . Paroxysmal atrial fibrillation (HCC)   . Thyroid disease     Past Surgical History:  Procedure Laterality Date  . ANGIOPLASTY    . COLONOSCOPY W/ POLYPECTOMY    . HEMORRHOID BANDING      There were no vitals filed for this visit.   Subjective Assessment - 07/29/18 1031    Subjective  Pt is a 72 y/o female who presents to OPPT with chronic Lt shoulder pain.  Pt with known partial tear of RTC approx 7-8 years ago and has now developed arthropathy.  Pt also reports some chronic Rt hip pain limiting walking.    Pertinent History  asthma, OA, MI, DM, HTN    Limitations  Walking;House hold activities    How long can you walk comfortably?  5-10 min    Patient Stated Goals  improve pain and function, wants more mobility and strength in shoulder    Currently in Pain?  Yes    Pain Score  0-No pain   up to 5/10   Pain Location  Shoulder     Pain Orientation  Left    Pain Descriptors / Indicators  Sharp    Pain Type  Chronic pain    Pain Onset  More than a month ago    Pain Frequency  Intermittent    Aggravating Factors   lifting overhead    Pain Relieving Factors  avoiding aggravating factors    Multiple Pain Sites  Yes    Pain Score  0   up to 2/10   Pain Location  Hip    Pain Orientation  Right    Pain Descriptors / Indicators  Aching    Pain Type  Chronic pain    Pain Onset  More than a month ago    Pain Frequency  Intermittent    Aggravating Factors   walking (uphill worse)    Pain Relieving Factors  sit and rest         [facility] PT Assessment - 07/29/18 1036      Assessment   Medical Diagnosis  M75.102,M12.812 (ICD-10-CM) - Left rotator cuff tear arthropathy    Referring Provider  Carlis Stable, PA-C    Onset Date/Surgical Date  --  7-8 years   Hand Dominance  Right    Next MD Visit  Dr. Karie Schwalbe: 2 weeks    Prior Therapy  none      Precautions   Precautions  None      Restrictions   Weight Bearing Restrictions  No      Balance Screen   Has the patient fallen in the past 6 months  No    Has the patient had a decrease in activity level because of a fear of falling?   No    Is the patient reluctant to leave their home because of a fear of falling?   No      Home Environment   Living Environment  Private residence    Living Arrangements  Alone    Type of Home  House    Home Access  Level entry    Home Layout  One level      Prior Function   Level of Independence  Independent    Vocation  Retired    Gaffer  retired from [employer] system    Leisure  painting, Clinical cytogeneticist, spend time with sister      Cognition   Overall Cognitive Status  Within Functional Limits for tasks assessed      Observation/Other Assessments   Focus on Therapeutic Outcomes (FOTO)   55 (45% limited; predicted 35% limited)      Posture/Postural Control   Posture/Postural Control  Postural  limitations    Postural Limitations  Rounded Shoulders;Forward head      ROM / Strength   AROM / PROM / Strength  AROM;Strength;PROM      AROM   AROM Assessment Site  Shoulder    Right/Left Shoulder  Right;Left    Right Shoulder Flexion  148 Degrees    Right Shoulder ABduction  180 Degrees    Right Shoulder Internal Rotation  --   FIR: T8/9   Left Shoulder Flexion  108 Degrees    Left Shoulder ABduction  78 Degrees    Left Shoulder Internal Rotation  --   FIR to Lt glute     PROM   PROM Assessment Site  Shoulder    Right/Left Shoulder  Left    Left Shoulder Flexion  135 Degrees    Left Shoulder ABduction  93 Degrees    Left Shoulder Internal Rotation  53 Degrees    Left Shoulder External Rotation  27 Degrees      Strength   Strength Assessment Site  Hip    Right/Left Hip  Right    Right Hip Extension  3/5    Right Hip ABduction  3+/5      Palpation   Palpation comment  tenderness Lt RTC insertion; Rt greater trochanter tenderness                Objective measurements completed on examination: See above findings.      [facility] Adult PT Treatment/Exercise - 07/29/18 1036      Shoulder Exercises: Seated   Retraction  Both;5 reps    Other Seated Exercises  posterior shoulder rolls x 10 reps      Shoulder Exercises: Stretch   Other Shoulder Stretches  low and mid doorway stretch x 30 sec each      Manual Therapy   Manual Therapy  Joint mobilization    Joint Mobilization  Lt shoulder: gentle LAD and A/P and inf grades 2-3 mobs with pt reporting decreased pain and tightness following  PT Education - 07/29/18 1136    Education Details  HEP    Person(s) Educated  Patient    Methods  Explanation;Demonstration;Handout    Comprehension  Verbalized understanding;Returned demonstration          PT Long Term Goals - 07/29/18 1141      PT LONG TERM GOAL #1   Title  independent with HEP    Status  New    Target Date  09/09/18      PT  LONG TERM GOAL #2   Title  improve Rt shoulder flexion to at least 125 degrees active for improved functional mobility    Status  New    Target Date  09/09/18      PT LONG TERM GOAL #3   Title  report pain < 3/10 with activity Lt shoulder    Status  New    Target Date  09/09/18      PT LONG TERM GOAL #4   Title  report ability to walk > 15 min without increase in Rt hip pain for improved function    Status  New    Target Date  09/09/18             Plan - 07/29/18 1137    Clinical Impression Statement  Pt is a 72 y/o female who presents to OPPT for chronic Lt shoulder pain and Rt hip pain.  Pt at this time would like to focus most attention to shoulder, but would like to address and treat hip pain as able.  Pt demonstrates poor posture, decreased strength and ROM as well as tenderness affecting functional mobility.  Pt will benefit from PT to address deficits listed.  Complete resolve of Lt shoulder symptoms is unlikely but feel pt will be able to improve functional mobility and decrease pain.    History and Personal Factors relevant to plan of care:  Lt shoulder RTC arthropathy, MI, asthma, HTN, DM, afib    Clinical Presentation  Stable    Clinical Decision Making  Low    Rehab Potential  Good    PT Frequency  2x / week    PT Duration  6 weeks    PT Treatment/Interventions  ADLs/Self Care Home Management;Cryotherapy;Electrical Stimulation;Moist Heat;Therapeutic exercise;Therapeutic activities;Functional mobility training;Stair training;Gait training;Ultrasound;Patient/family education;Manual techniques;Vasopneumatic Device;Taping;Dry needling;Passive range of motion    PT Next Visit Plan  review HEP, [place] if order signed to Rt hip and give hip strengthening exercises, manual/modalities for Lt shoulder and continue postural exercises    PT Home Exercise Plan  Access Code: [email or web address]     Consulted and Agree with Plan of Care  Patient       Patient will benefit from skilled  therapeutic intervention in order to improve the following deficits and impairments:  Impaired flexibility, Postural dysfunction, Decreased range of motion, Difficulty walking, Increased muscle spasms, Increased fascial restricitons, Pain, Impaired UE functional use, Decreased strength  Visit Diagnosis: Chronic left shoulder pain - Plan: PT plan of care cert/re-cert  Stiffness of left shoulder, not elsewhere classified - Plan: PT plan of care cert/re-cert  Pain in right hip - Plan: PT plan of care cert/re-cert     Problem List Patient Active Problem List   Diagnosis Date Noted  . Encounter for weight loss counseling 03/06/2018  . Uncontrolled type 2 diabetes mellitus with hyperglycemia (HCC) 12/27/2017  . Class 1 obesity due to excess calories with serious comorbidity in adult 12/27/2017  . 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 Idiopathic chronic  gout 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 11/28/2017  . Encounter for long-term (current) use of medications 11/28/2017  . Anticoagulant long-term use 10/21/2017  . Sensorineural hearing loss (SNHL) of both ears 09/27/2017  . Hypertension goal BP (blood pressure) < 140/90 08/29/2017  . Coronary artery disease involving native coronary artery of native heart without angina pectoris 08/29/2017  . Need for shingles vaccine 05/31/2017  . Encounter for monitoring statin therapy 05/29/2017  . Blurred vision 05/29/2017  . Hearing difficulty of both ears 05/29/2017  . Trochanteric bursitis, right hip 05/29/2017  . Gout of ankle 04/08/2017  . History of myocardial infarct at age greater than 60 years 01/23/2017  . Mixed diabetic hyperlipidemia associated with type 2 diabetes mellitus (HCC) 01/23/2017  . Acquired hypothyroidism 01/23/2017  . Moderate persistent asthma 01/16/2017  . Paroxysmal atrial fibrillation (HCC) 01/16/2017  . Cardiomegaly 01/16/2017      Clarita Crane, PT, DPT 07/29/18 11:45 AM    [facility] [address] [place], [place], [address] Phone: [phone]   Fax:  [phone]  Name: Tara Campbell MRN: [id number] Date of Birth: June 20, 1946

## 2018-08-01 ENCOUNTER — Encounter: Payer: Medicare HMO | Admitting: Physical Therapy

## 2018-08-05 ENCOUNTER — Ambulatory Visit: Payer: Medicare HMO | Admitting: Physical Therapy

## 2018-08-05 ENCOUNTER — Encounter: Payer: Self-pay | Admitting: Physical Therapy

## 2018-08-05 DIAGNOSIS — M25551 Pain in right hip: Secondary | ICD-10-CM

## 2018-08-05 DIAGNOSIS — M25512 Pain in left shoulder: Secondary | ICD-10-CM

## 2018-08-05 DIAGNOSIS — G8929 Other chronic pain: Secondary | ICD-10-CM

## 2018-08-05 DIAGNOSIS — M25612 Stiffness of left shoulder, not elsewhere classified: Secondary | ICD-10-CM

## 2018-08-05 LAB — CBC WITH DIFFERENTIAL/PLATELET
BASOS ABS: 62 {cells}/uL (ref 0–200)
Basophils Relative: 0.8 %
EOS ABS: 809 {cells}/uL — AB (ref 15–500)
Eosinophils Relative: 10.5 %
HCT: 37.2 % (ref 35.0–45.0)
Hemoglobin: 12.8 g/dL (ref 11.7–15.5)
Lymphs Abs: 1964 cells/uL (ref 850–3900)
MCH: 29.4 pg (ref 27.0–33.0)
MCHC: 34.4 g/dL (ref 32.0–36.0)
MCV: 85.5 fL (ref 80.0–100.0)
MONOS PCT: 9.2 %
MPV: 11 fL (ref 7.5–12.5)
NEUTROS PCT: 54 %
Neutro Abs: 4158 cells/uL (ref 1500–7800)
PLATELETS: 330 10*3/uL (ref 140–400)
RBC: 4.35 10*6/uL (ref 3.80–5.10)
RDW: 13.2 % (ref 11.0–15.0)
TOTAL LYMPHOCYTE: 25.5 %
WBC mixed population: 708 cells/uL (ref 200–950)
WBC: 7.7 10*3/uL (ref 3.8–10.8)

## 2018-08-05 LAB — COMPLETE METABOLIC PANEL WITH GFR
AG Ratio: 1.9 (calc) (ref 1.0–2.5)
ALKALINE PHOSPHATASE (APISO): 94 U/L (ref 33–130)
ALT: 18 U/L (ref 6–29)
AST: 16 U/L (ref 10–35)
Albumin: 4.5 g/dL (ref 3.6–5.1)
BILIRUBIN TOTAL: 0.5 mg/dL (ref 0.2–1.2)
BUN / CREAT RATIO: 14 (calc) (ref 6–22)
BUN: 15 mg/dL (ref 7–25)
CHLORIDE: 105 mmol/L (ref 98–110)
CO2: 24 mmol/L (ref 20–32)
Calcium: 10.3 mg/dL (ref 8.6–10.4)
Creat: 1.04 mg/dL — ABNORMAL HIGH (ref 0.60–0.93)
GFR, Est African American: 61 mL/min/{1.73_m2} (ref >=60)
GFR, Est Non African American: 53 mL/min/{1.73_m2} — ABNORMAL LOW (ref >=60)
GLUCOSE: 93 mg/dL (ref 65–99)
Globulin: 2.4 g/dL (calc) (ref 1.9–3.7)
POTASSIUM: 4.1 mmol/L (ref 3.5–5.3)
Sodium: 139 mmol/L (ref 135–146)
Total Protein: 6.9 g/dL (ref 6.1–8.1)

## 2018-08-05 LAB — TSH: TSH: 3.75 m[IU]/L (ref 0.40–4.50)

## 2018-08-05 NOTE — Patient Instructions (Addendum)
IONTOPHORESIS PATIENT PRECAUTIONS & CONTRAINDICATIONS:  . Redness under one or both electrodes can occur.  This characterized by a uniform redness that usually disappears within 12 hours of treatment. . Small pinhead size blisters may result in response to the drug.  Contact your physician if the problem persists more than 24 hours. . On rare occasions, iontophoresis therapy can result in temporary skin reactions such as rash, inflammation, irritation or burns.  The skin reactions may be the result of individual sensitivity to the ionic solution used, the condition of the skin at the start of treatment, reaction to the materials in the electrodes, allergies or sensitivity to dexamethasone, or a poor connection between the patch and your skin.  Discontinue using iontophoresis if you have any of these reactions and report to your therapist. . Remove the Patch or electrodes if you have any undue sensation of pain or burning during the treatment and report discomfort to your therapist. . Tell your Therapist if you have had known adverse reactions to the application of electrical current. . If using the Patch, the LED light will turn off when treatment is complete and the patch can be removed.  Approximate treatment time is 1-3 hours.  Remove the patch when light goes off or after 6 hours. . The Patch can be worn during normal activity, however excessive motion where the electrodes have been placed can cause poor contact between the skin and the electrode or uneven electrical current resulting in greater risk of skin irritation. [place] Keep out of the reach of children.   . DO NOT use if you have a cardiac pacemaker or any other electrically sensitive implanted device. . DO NOT use if you have a known sensitivity to dexamethasone. . DO NOT use during Magnetic Resonance Imaging (MRI). . DO NOT use over broken or compromised skin (e.g. sunburn, cuts, or acne) due to the increased risk of skin reaction. . DO  NOT SHAVE over the area to be treated:  To establish good contact between the Patch and the skin, excessive hair may be clipped. . DO NOT place the Patch or electrodes on or over your eyes, directly over your heart, or brain. . DO NOT reuse the Patch or electrodes as this may cause burns to occur.   Access Code: [email or web address]  URL: [email or web address]  Date: 08/05/2018  Prepared by: Moshe Cipro   Exercises  Seated Scapular Retraction - 10 reps - 1 sets - 5 sec hold - 2x daily - 7x weekly  Standing Backward Shoulder Rolls - 10 reps - 1 sets - 2x daily - 7x weekly  Doorway Pec Stretch at 60 Elevation - 3 reps - 1 sets - 30 sec hold - 2x daily - 7x weekly  Supine Bridge - 10 reps - 1 sets - 5 sec hold - 2x daily - 7x weekly  Supine Piriformis Stretch with Foot on Ground - 3 reps - 1 sets - 30 sec hold - 2x daily - 7x weekly  Sidelying Hip Abduction - 10 reps - 3 sets - 1x daily - 7x weekly

## 2018-08-05 NOTE — Therapy (Signed)
[facility] [place] [address] [place], [place], [address] Phone: [phone]   Fax:  [phone]  Physical Therapy Treatment  Patient Details  Name: Tara Campbell MRN: [id number] Date of Birth: January 27, 1946 Referring Provider: Carlis Stable, [place]   Encounter Date: 08/05/2018  PT End of Session - 08/05/18 1015    Visit Number  2    Number of Visits  12    Date for PT Re-Evaluation  09/09/18    Authorization Type  [employer]    PT Start Time  0928    PT Stop Time  1008    PT Time Calculation (min)  40 min    Activity Tolerance  Patient tolerated treatment well    Behavior During Therapy  [facility] for tasks assessed/performed       Past Medical History:  Diagnosis Date  . Allergy   . Asthma   . Colon polyps   . Diabetes mellitus without complication (HCC)   . Gout   . Heart attack (HCC) 07/30/2016   PCI, 2 stents  . Hyperlipidemia   . Hypertension   . Internal hemorrhoids   . Left rotator cuff tear   . 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 Otomycosis of right ear 09/27/2017  . Paroxysmal atrial fibrillation (HCC)   . Thyroid disease     Past Surgical History:  Procedure Laterality Date  . ANGIOPLASTY    . COLONOSCOPY W/ POLYPECTOMY    . HEMORRHOID BANDING      There were no vitals filed for this visit.  Subjective Assessment - 08/05/18 0930    Subjective  has had family in town and hasn't done exercises; reports brother-in-law is on hospice so she's been busy. reports no significant difficulty with shoulder or hip over weekend    Patient Stated Goals  improve pain and function, wants more mobility and strength in shoulder    Pain Score  0-No pain    Pain Score  0                       [facility] Adult PT Treatment/Exercise - 08/05/18 0932      Exercises   Exercises  Knee/Hip;Shoulder      Knee/Hip Exercises: Stretches   Piriformis Stretch  Right;2 reps;30 seconds      Knee/Hip Exercises:  Aerobic   Nustep  4 extremities x 5 min; L5   PT present to discuss progress and POC     Knee/Hip Exercises: Supine   Bridges  Both;10 reps      Knee/Hip Exercises: Sidelying   Hip ABduction  Right;10 reps      Shoulder Exercises: Seated   Retraction  Both;10 reps    Other Seated Exercises  posterior shoulder rolls x 10 reps      Shoulder Exercises: Standing   External Rotation  Left;10 reps;Theraband    Theraband Level (Shoulder External Rotation)  Level 2 (Red)    Internal Rotation  Left;10 reps;Theraband    Theraband Level (Shoulder Internal Rotation)  Level 2 (Red)    Extension  Both;10 reps;Theraband    Theraband Level (Shoulder Extension)  Level 2 (Red)    Row  Both;10 reps;Theraband    Theraband Level (Shoulder Row)  Level 2 (Red)      Shoulder Exercises: Stretch   Other Shoulder Stretches  low and mid doorway stretch x 30 sec each      Modalities   Modalities  Iontophoresis  Iontophoresis   Type of Iontophoresis  Dexamethasone    Location  Rt greater trochanter    Dose  1.0 cc    Time  6 hour patch      Manual Therapy   Manual Therapy  Joint mobilization    Joint Mobilization  Lt shoulder: gentle LAD and A/P and inf grades 2-3 mobs with pt reporting decreased pain and tightness following             PT Education - 08/05/18 1015    Education Details  HEP; ionto    Person(s) Educated  Patient    Methods  Explanation;Demonstration;Handout    Comprehension  Verbalized understanding;Returned demonstration;Need further instruction          PT Long Term Goals - 07/29/18 1141      PT LONG TERM GOAL #1   Title  independent with HEP    Status  New    Target Date  09/09/18      PT LONG TERM GOAL #2   Title  improve Rt shoulder flexion to at least 125 degrees active for improved functional mobility    Status  New    Target Date  09/09/18      PT LONG TERM GOAL #3   Title  report pain < 3/10 with activity Lt shoulder    Status  New    Target  Date  09/09/18      PT LONG TERM GOAL #4   Title  report ability to walk > 15 min without increase in Rt hip pain for improved function    Status  New    Target Date  09/09/18            Plan - 08/05/18 1016    Clinical Impression Statement  Pt reports she lost handouts and wasn't able to complete HEP due to family in town and spending time with brother in law who is dying.  Added hip exercises to HEP today and provided additional copy.  Initiated ionto today for Rt hip to see if pt notices improvment.  Will continue to benefit from PT ot maximize function.    Rehab Potential  Good    PT Frequency  2x / week    PT Duration  6 weeks    PT Treatment/Interventions  ADLs/Self Care Home Management;Cryotherapy;Electrical Stimulation;Moist Heat;Therapeutic exercise;Therapeutic activities;Functional mobility training;Stair training;Gait training;Ultrasound;Patient/family education;Manual techniques;Vasopneumatic Device;Taping;Dry needling;Passive range of motion    PT Next Visit Plan  review HEP, assess response to Ionto to Rt hip and give hip strengthening exercises, manual/modalities for Lt shoulder and continue postural exercises    PT Home Exercise Plan  Access Code: [email or web address]     Consulted and Agree with Plan of Care  Patient       Patient will benefit from skilled therapeutic intervention in order to improve the following deficits and impairments:  Impaired flexibility, Postural dysfunction, Decreased range of motion, Difficulty walking, Increased muscle spasms, Increased fascial restricitons, Pain, Impaired UE functional use, Decreased strength  Visit Diagnosis: Chronic left shoulder pain  Stiffness of left shoulder, not elsewhere classified  Pain in right hip     Problem List Patient Active Problem List   Diagnosis Date Noted  . Encounter for weight loss counseling 03/06/2018  . Uncontrolled type 2 diabetes mellitus with hyperglycemia (HCC) 12/27/2017  . Class 1 obesity due  to excess calories with serious comorbidity in adult 12/27/2017  . 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 Idiopathic chronic gout of multiple sites without  tophus 11/28/2017  . Encounter for long-term (current) use of medications 11/28/2017  . Anticoagulant long-term use 10/21/2017  . Sensorineural hearing loss (SNHL) of both ears 09/27/2017  . Hypertension goal BP (blood pressure) < 140/90 08/29/2017  . Coronary artery disease involving native coronary artery of native heart without angina pectoris 08/29/2017  . Need for shingles vaccine 05/31/2017  . Encounter for monitoring statin therapy 05/29/2017  . Blurred vision 05/29/2017  . Hearing difficulty of both ears 05/29/2017  . Trochanteric bursitis, right hip 05/29/2017  . Gout of ankle 04/08/2017  . History of myocardial infarct at age greater than 60 years 01/23/2017  . Mixed diabetic hyperlipidemia associated with type 2 diabetes mellitus (HCC) 01/23/2017  . Acquired hypothyroidism 01/23/2017  . Moderate persistent asthma 01/16/2017  . Paroxysmal atrial fibrillation (HCC) 01/16/2017  . Cardiomegaly 01/16/2017      Clarita Crane, PT, DPT 08/05/18 10:20 AM     [facility] [address] [place], [place], [address] Phone: [phone]   Fax:  [phone]  Name: Tara Campbell MRN: [id number] Date of Birth: 05-18-1946

## 2018-08-06 ENCOUNTER — Encounter: Payer: Self-pay | Admitting: Physician Assistant

## 2018-08-06 DIAGNOSIS — E1122 Type 2 diabetes mellitus with diabetic chronic kidney disease: Secondary | ICD-10-CM | POA: Insufficient documentation

## 2018-08-07 ENCOUNTER — Ambulatory Visit (INDEPENDENT_AMBULATORY_CARE_PROVIDER_SITE_OTHER): Payer: Medicare HMO | Admitting: Physical Therapy

## 2018-08-07 DIAGNOSIS — M25612 Stiffness of left shoulder, not elsewhere classified: Secondary | ICD-10-CM | POA: Diagnosis not present

## 2018-08-07 DIAGNOSIS — M25512 Pain in left shoulder: Secondary | ICD-10-CM | POA: Diagnosis not present

## 2018-08-07 DIAGNOSIS — G8929 Other chronic pain: Secondary | ICD-10-CM

## 2018-08-07 DIAGNOSIS — M25551 Pain in right hip: Secondary | ICD-10-CM | POA: Diagnosis not present

## 2018-08-07 NOTE — Therapy (Signed)
[facility] [place] [address] [place], [place], [address] Phone: [phone]   Fax:  [phone]  Physical Therapy Treatment  Patient Details  Name: Tara Campbell MRN: [id number] Date of Birth: [birth year] Referring Provider: Carlis Stable, [place]   Encounter Date: 08/07/2018  PT End of Session - 08/07/18 1145    Visit Number  3    Number of Visits  12    Date for PT Re-Evaluation  09/09/18    Authorization Type  [employer]    PT Start Time  1118    PT Stop Time  1145    PT Time Calculation (min)  27 min    Activity Tolerance  Patient tolerated treatment well;No increased pain    Behavior During Therapy  WFL for tasks assessed/performed       Past Medical History:  Diagnosis Date  . Allergy   . Asthma   . Colon polyps   . Diabetes mellitus without complication (HCC)   . Gout   . Heart attack (HCC) 07/30/2016   PCI, 2 stents  . Hyperlipidemia   . Hypertension   . Internal hemorrhoids   . Left rotator cuff tear   . 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 Otomycosis of right ear 09/27/2017  . Paroxysmal atrial fibrillation (HCC)   . Thyroid disease     Past Surgical History:  Procedure Laterality Date  . ANGIOPLASTY    . COLONOSCOPY W/ POLYPECTOMY    . HEMORRHOID BANDING      There were no vitals filed for this visit.  Subjective Assessment - 08/07/18 1118    Subjective  Pt reports that her shoulder feels about the same.  The patch helped quite a bit, "it was remarkable.  I can go up stairs again".  Pt arrived late to appt, thought her appt was supposed to be at 11:20.    She did some stretches this morning already.     Patient Stated Goals  improve pain and function, wants more mobility and strength in shoulder    Currently in Pain?  Yes    Pain Score  3     Pain Location  Shoulder    Pain Orientation  Left    Pain Descriptors / Indicators  Aching;Dull    Aggravating Factors   reaching beyond what  should be doing    Pain Relieving Factors  rest, not doing anythng.          [facility] PT Assessment - 08/07/18 0001      Assessment   Medical Diagnosis  M75.102,M12.812 (ICD-10-CM) - Left rotator cuff tear arthropathy    Referring Provider  Carlis Stable, PA-C    Onset Date/Surgical Date  --   7-8 years   Hand Dominance  Right    Next MD Visit  Dr. Karie Schwalbe: 2 weeks        [facility] Adult PT Treatment/Exercise - 08/07/18 0001      Knee/Hip Exercises: Stretches   Piriformis Stretch  Right;Left;2 reps;30 seconds      Knee/Hip Exercises: Supine   Bridges  1 set;10 reps      Knee/Hip Exercises: Sidelying   Hip ABduction  Strengthening;Right;1 set;15 reps      Shoulder Exercises: Standing   External Rotation  Left;10 reps;Theraband    Theraband Level (Shoulder External Rotation)  Level 1 (Yellow)    Internal Rotation  --    Theraband Level (Shoulder Internal Rotation)  --    Extension  Both;10 reps;Theraband  Theraband Level (Shoulder Extension)  Level 2 (Red)    Row  Both;Theraband;15 reps    Theraband Level (Shoulder Row)  Level 2 (Red)    Retraction  Both;10 reps   W's with back against noodle     Shoulder Exercises: Stretch   Other Shoulder Stretches  low and mid doorway stretch x 30 sec each   cues for form.      Iontophoresis   Type of Iontophoresis  Dexamethasone    Location  Lt shoulder    Dose  1.0cc    Time  6 hr patch      Manual Therapy   Joint Mobilization  --                  PT Long Term Goals - 07/29/18 1141      PT LONG TERM GOAL #1   Title  independent with HEP    Status  New    Target Date  09/09/18      PT LONG TERM GOAL #2   Title  improve Rt shoulder flexion to at least 125 degrees active for improved functional mobility    Status  New    Target Date  09/09/18      PT LONG TERM GOAL #3   Title  report pain < 3/10 with activity Lt shoulder    Status  New    Target Date  09/09/18      PT LONG TERM GOAL #4   Title   report ability to walk > 15 min without increase in Rt hip pain for improved function    Status  New    Target Date  09/09/18            Plan - 08/07/18 1146    Clinical Impression Statement  Pt had positive response to ionto patch to Rt hip and exercises; reporting less pain and greater ease with stairs.  Pt arrived late; limited session today. She tolerated all exercises well, excepted resisted shoulder ER.  Trial of ionto placed on Lt shoulder.     Rehab Potential  Good    PT Frequency  2x / week    PT Duration  6 weeks    PT Next Visit Plan  assess ionto to Lt shoulder; progress HEP; manual therapy to Lt shoulder.     Consulted and Agree with Plan of Care  Patient       Patient will benefit from skilled therapeutic intervention in order to improve the following deficits and impairments:  Impaired flexibility, Postural dysfunction, Decreased range of motion, Difficulty walking, Increased muscle spasms, Increased fascial restricitons, Pain, Impaired UE functional use, Decreased strength  Visit Diagnosis: Chronic left shoulder pain  Stiffness of left shoulder, not elsewhere classified  Pain in right hip     Problem List Patient Active Problem List   Diagnosis Date Noted  . 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  . Encounter for weight loss counseling 03/06/2018  . Uncontrolled type 2 diabetes mellitus with hyperglycemia (HCC) 12/27/2017  . Class 1 obesity due to excess calories with serious comorbidity in adult 12/27/2017  . 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 Idiopathic chronic gout 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 11/28/2017  . Encounter for long-term (current) use of medications 11/28/2017  . Anticoagulant long-term use 10/21/2017  . Sensorineural hearing loss (SNHL) of both ears 09/27/2017  . Hypertension goal BP (blood pressure) < 140/90 08/29/2017  . Coronary artery disease involving native  coronary artery of native heart  without angina pectoris 08/29/2017  . Need for shingles vaccine 05/31/2017  . Encounter for monitoring statin therapy 05/29/2017  . Blurred vision 05/29/2017  . Hearing difficulty of both ears 05/29/2017  . Trochanteric bursitis, right hip 05/29/2017  . Gout of ankle 04/08/2017  . History of myocardial infarct at age greater than 60 years 01/23/2017  . Mixed diabetic hyperlipidemia associated with type 2 diabetes mellitus (HCC) 01/23/2017  . Acquired hypothyroidism 01/23/2017  . Moderate persistent asthma 01/16/2017  . Paroxysmal atrial fibrillation (HCC) 01/16/2017  . Cardiomegaly 01/16/2017   Mayer Camel, PTA 08/07/18 1:20 PM  [facility] [address] [place], [place], [address] Phone: [phone]   Fax:  [phone]  Name: Tara Campbell MRN: [id number] Date of Birth: 17-Apr-1946

## 2018-08-11 ENCOUNTER — Ambulatory Visit: Payer: Medicare HMO | Admitting: Sports Medicine

## 2018-08-11 ENCOUNTER — Encounter: Payer: Medicare HMO | Admitting: Physical Therapy

## 2018-08-14 ENCOUNTER — Encounter: Payer: Medicare HMO | Admitting: Physical Therapy

## 2018-08-18 ENCOUNTER — Encounter: Payer: Self-pay | Admitting: Physical Therapy

## 2018-08-18 ENCOUNTER — Ambulatory Visit (INDEPENDENT_AMBULATORY_CARE_PROVIDER_SITE_OTHER): Payer: Medicare HMO | Admitting: Physical Therapy

## 2018-08-18 DIAGNOSIS — M25512 Pain in left shoulder: Secondary | ICD-10-CM | POA: Diagnosis not present

## 2018-08-18 DIAGNOSIS — G8929 Other chronic pain: Secondary | ICD-10-CM

## 2018-08-18 DIAGNOSIS — M25551 Pain in right hip: Secondary | ICD-10-CM

## 2018-08-18 DIAGNOSIS — M25612 Stiffness of left shoulder, not elsewhere classified: Secondary | ICD-10-CM

## 2018-08-18 NOTE — Therapy (Signed)
[place], [place], [address] Phone: [phone]   Fax:  [phone]  Physical Therapy Treatment/Discharge  Patient Details  Name: Tara Campbell MRN: [id number] Date of Birth: 29-May-1946 Referring Provider: [name], [place]   Encounter Date: 08/18/2018  PT End of Session - 08/18/18 1022    Visit Number  4    Number of Visits  12    Date for PT Re-Evaluation  09/09/18    Authorization Type  [employer]    PT Start Time  0930    PT Stop Time  1002    PT Time Calculation (min)  32 min    Activity Tolerance  Patient tolerated treatment well;No increased pain    Behavior During Therapy  WFL for tasks assessed/performed       Past Medical History:  Diagnosis Date  . Allergy   . Asthma   . Colon polyps   . Diabetes mellitus without complication ([place])   . Gout   . Heart attack ([place]) 07/30/2016   PCI, 2 stents  . Hyperlipidemia   . Hypertension   . Internal hemorrhoids   . Left rotator cuff tear   . 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 Otomycosis of right ear 09/27/2017  . Paroxysmal atrial fibrillation (HCC)   . Thyroid disease     Past Surgical History:  Procedure Laterality Date  . ANGIOPLASTY    . COLONOSCOPY W/ POLYPECTOMY    . HEMORRHOID BANDING      There were no vitals filed for this visit.  Subjective Assessment - 08/18/18 0934    Subjective  sister's husband passed away last week; has been having a difficult week.  shoulder is still a little sore but hip feels great.  wants to wrap up today due to busy fall and revisit therapy in the future.  Lt shoulder up to 6-7/10 with activity    How long can you walk comfortably?  15 min    Patient Stated Goals  improve pain and function, wants more mobility and strength in shoulder    Currently in Pain?  Yes    Pain Score  4     Pain Location  Shoulder    Pain Orientation  Left    Pain Descriptors / Indicators  Aching;Dull     Pain Type  Chronic pain    Pain Onset  More than a month ago    Pain Frequency  Intermittent    Aggravating Factors   reaching beyone what she should be doing    Pain Relieving Factors  rest, not doing anything    Pain Score  0         [facility] PT Assessment - 08/18/18 0943      Assessment   Medical Diagnosis  M75.102,M12.812 (ICD-10-CM) - Left rotator cuff tear arthropathy    Referring Provider  [name], PA-C    Hand Dominance  Right    Next MD Visit  08/28/18      Observation/Other Assessments   Focus on Therapeutic Outcomes (FOTO)   55 (45% limited)      AROM   Left Shoulder Flexion  108 Degrees    Left Shoulder ABduction  92 Degrees    Left Shoulder Internal Rotation  --   FIR to iliac crest                  [facility] Adult PT Treatment/Exercise - 08/18/18 7341  Self-Care   Self-Care  Other Self-Care Comments    Other Self-Care Comments   discussed current progress and pt's goals of care.  pt at this time requesting d/c due to current obligations outside of therapy including recent passing of brother-in-law and upcoming planned trips.  agreeable to request new PT order when pt feels she can commit to PT.  verbally discussed current HEP and recommended pt continue to maintain current functional level.      Exercises   Exercises  Knee/Hip;Shoulder      Knee/Hip Exercises: Aerobic   Nustep  4 extremities x 8 min; L5      Shoulder Exercises: Stretch   Internal Rotation Stretch Limitations  3x30 sec      Iontophoresis   Type of Iontophoresis  Dexamethasone    Location  Lt shoulder    Dose  1.0cc    Time  6 hr patch                  PT Long Term Goals - 08/18/18 1022      PT LONG TERM GOAL #1   Title  independent with HEP    Status  Achieved      PT LONG TERM GOAL #2   Title  improve Rt shoulder flexion to at least 125 degrees active for improved functional mobility    Status  Not Met      PT LONG TERM GOAL #3    Title  report pain < 3/10 with activity Lt shoulder    Status  Not Met      PT LONG TERM GOAL #4   Title  report ability to walk > 15 min without increase in Rt hip pain for improved function    Status  Achieved            Plan - 08/18/18 1023    Clinical Impression Statement  Pt met 2/4 LTGs, and is requesting d/c at this time due to other obligations that she must tend to.  Pt plans to request new referral when she can fully commit to PT.    Rehab Potential  Good    PT Frequency  2x / week    PT Duration  6 weeks    PT Treatment/Interventions  ADLs/Self Care Home Management;Cryotherapy;Electrical Stimulation;Moist Heat;Therapeutic exercise;Therapeutic activities;Functional mobility training;Stair training;Gait training;Ultrasound;Patient/family education;Manual techniques;Vasopneumatic Device;Taping;Dry needling;Passive range of motion    PT Next Visit Plan  assess ionto to Lt shoulder; progress HEP; manual therapy to Lt shoulder.     PT Home Exercise Plan  Access Code: [email or web address]     Consulted and Agree with Plan of Care  Patient       Patient will benefit from skilled therapeutic intervention in order to improve the following deficits and impairments:  Impaired flexibility, Postural dysfunction, Decreased range of motion, Difficulty walking, Increased muscle spasms, Increased fascial restricitons, Pain, Impaired UE functional use, Decreased strength  Visit Diagnosis: Chronic left shoulder pain  Stiffness of left shoulder, not elsewhere classified  Pain in right hip     Problem List Patient Active Problem List   Diagnosis Date Noted  . Type 2 diabetes mellitus with diabetic chronic kidney disease ([place]) 08/06/2018  . Encounter for weight loss counseling 03/06/2018  . Uncontrolled type 2 diabetes mellitus with hyperglycemia ([place]) 12/27/2017  . Class 1 obesity due to excess calories with serious comorbidity in adult 12/27/2017  . 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  Idiopathic chronic gout 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 11/28/2017  .  Encounter for long-term (current) use of medications 11/28/2017  . Anticoagulant long-term use 10/21/2017  . Sensorineural hearing loss (SNHL) of both ears 09/27/2017  . Hypertension goal BP (blood pressure) < 140/90 08/29/2017  . Coronary artery disease involving native coronary artery of native heart without angina pectoris 08/29/2017  . Need for shingles vaccine 05/31/2017  . Encounter for monitoring statin therapy 05/29/2017  . Blurred vision 05/29/2017  . Hearing difficulty of both ears 05/29/2017  . Trochanteric bursitis, right hip 05/29/2017  . Gout of ankle 04/08/2017  . History of myocardial infarct at age greater than 34 years 01/23/2017  . Mixed diabetic hyperlipidemia associated with type 2 diabetes mellitus ([place]) 01/23/2017  . Acquired hypothyroidism 01/23/2017  . Moderate persistent asthma 01/16/2017  . Paroxysmal atrial fibrillation ([place]) 01/16/2017  . Cardiomegaly 01/16/2017      Laureen Abrahams, PT, DPT 08/18/18 10:25 AM    [place], [place], [address] Phone: [phone]   Fax:  [phone]  Name: Tara Campbell MRN: [id number] Date of Birth: [birth year]     PHYSICAL THERAPY DISCHARGE SUMMARY  Visits from Start of Care: 3  Current functional level related to goals / functional outcomes: See above   Remaining deficits: See above   Education / Equipment: HEP  Plan: Patient agrees to discharge.  Patient goals were partially met. Patient is being discharged due to the patient's request.  ?????      Laureen Abrahams, PT, DPT 08/18/18 10:25 AM  [facility] [place], [place] [address]  [phone] (office) [phone] (fax)

## 2018-08-20 ENCOUNTER — Encounter: Payer: Medicare HMO | Admitting: Physical Therapy

## 2018-08-25 ENCOUNTER — Ambulatory Visit (INDEPENDENT_AMBULATORY_CARE_PROVIDER_SITE_OTHER): Payer: Medicare HMO

## 2018-08-25 ENCOUNTER — Ambulatory Visit (INDEPENDENT_AMBULATORY_CARE_PROVIDER_SITE_OTHER): Payer: Medicare HMO | Admitting: Physician Assistant

## 2018-08-25 ENCOUNTER — Encounter: Payer: Self-pay | Admitting: Physician Assistant

## 2018-08-25 VITALS — BP 137/80 | HR 110 | Temp 98.0°F | Wt 186.0 lb

## 2018-08-25 DIAGNOSIS — J4541 Moderate persistent asthma with (acute) exacerbation: Secondary | ICD-10-CM | POA: Diagnosis not present

## 2018-08-25 DIAGNOSIS — J3089 Other allergic rhinitis: Secondary | ICD-10-CM

## 2018-08-25 DIAGNOSIS — R0902 Hypoxemia: Secondary | ICD-10-CM | POA: Diagnosis not present

## 2018-08-25 MED ORDER — PREDNISONE 50 MG PO TABS: 50.0000 mg | ORAL_TABLET | Freq: Every day | ORAL | 0 refills | Status: DC

## 2018-08-25 MED ORDER — LEVOCETIRIZINE DIHYDROCHLORIDE 5 MG PO TABS: 5.0000 mg | ORAL_TABLET | Freq: Every evening | ORAL | 3 refills | Status: DC

## 2018-08-25 MED ORDER — MONTELUKAST SODIUM 10 MG PO TABS: 10.0000 mg | ORAL_TABLET | Freq: Every day | ORAL | 3 refills | Status: DC

## 2018-08-25 MED ORDER — FLUTICASONE PROPIONATE 50 MCG/ACT NA SUSP: 1.0000 | Freq: Every day | NASAL | 3 refills | Status: DC

## 2018-08-25 NOTE — Patient Instructions (Signed)
Asthma, Acute Bronchospasm °Acute bronchospasm caused by asthma is also referred to as an asthma attack. Bronchospasm means your air passages become narrowed. The narrowing is caused by inflammation and tightening of the muscles in the air tubes (bronchi) in your lungs. This can make it hard to breathe or cause you to wheeze and cough. °What are the causes? °Possible triggers are: °· Animal dander from the skin, hair, or feathers of animals. °· Dust mites contained in house dust. °· Cockroaches. °· Pollen from trees or grass. °· Mold. °· Cigarette or tobacco smoke. °· Air pollutants such as dust, household cleaners, hair sprays, aerosol sprays, paint fumes, strong chemicals, or strong odors. °· Cold air or weather changes. Cold air may trigger inflammation. Winds increase molds and pollens in the air. °· Strong emotions such as crying or laughing hard. °· Stress. °· Certain medicines such as aspirin or beta-blockers. °· Sulfites in foods and drinks, such as dried fruits and wine. °· Infections or inflammatory conditions, such as a flu, cold, or inflammation of the nasal membranes (rhinitis). °· Gastroesophageal reflux disease (GERD). GERD is a condition where stomach acid backs up into your esophagus. °· Exercise or strenuous activity. ° °What are the signs or symptoms? °· Wheezing. °· Excessive coughing, particularly at night. °· Chest tightness. °· Shortness of breath. °How is this diagnosed? °Your health care provider will ask you about your medical history and perform a physical exam. A chest X-ray or blood testing may be performed to look for other causes of your symptoms or other conditions that may have triggered your asthma attack. °How is this treated? °Treatment is aimed at reducing inflammation and opening up the airways in your lungs. Most asthma attacks are treated with inhaled medicines. These include quick relief or rescue medicines (such as bronchodilators) and controller medicines (such as inhaled  corticosteroids). These medicines are sometimes given through an inhaler or a nebulizer. Systemic steroid medicine taken by mouth or given through an IV tube also can be used to reduce the inflammation when an attack is moderate or severe. Antibiotic medicines are only used if a bacterial infection is present. °Follow these instructions at home: °· Rest. °· Drink plenty of liquids. This helps the mucus to remain thin and be easily coughed up. Only use caffeine in moderation and do not use alcohol until you have recovered from your illness. °· Do not smoke. Avoid being exposed to secondhand smoke. °· You play a critical role in keeping yourself in good health. Avoid exposure to things that cause you to wheeze or to have breathing problems. °· Keep your medicines up-to-date and available. Carefully follow your health care provider’s treatment plan. °· Take your medicine exactly as prescribed. °· When pollen or pollution is bad, keep windows closed and use an air conditioner or go to places with air conditioning. °· Asthma requires careful medical care. See your health care provider for a follow-up as advised. If you are more than [redacted] weeks pregnant and you were prescribed any new medicines, let your obstetrician know about the visit and how you are doing. Follow up with your health care provider as directed. °· After you have recovered from your asthma attack, make an appointment with your outpatient doctor to talk about ways to reduce the likelihood of future attacks. If you do not have a doctor who manages your asthma, make an appointment with a primary care doctor to discuss your asthma. °Get help right away if: °· You are getting worse. °·   You have trouble breathing. If severe, call your local emergency services (911 in the [place] °· You develop chest pain or discomfort. °· You are vomiting. °· You are not able to keep fluids down. °· You are coughing up yellow, green, brown, or bloody sputum. °· You have a fever  and your symptoms suddenly get worse. °· You have trouble swallowing. °This information is not intended to replace advice given to you by your health care provider. Make sure you discuss any questions you have with your health care provider. °Document Released: 03/06/2007 Document Revised: 05/02/2016 Document Reviewed: 05/27/2013 °Elsevier Interactive Patient Education © 2017 [employer] ° °

## 2018-08-25 NOTE — Progress Notes (Signed)
HPI:                                                                Tara Campbell is a 72 y.o. female who presents to [place]: [place] today for asthma exacerbation  Asthma  She complains of chest tightness, cough, difficulty breathing and wheezing. There is no sputum production. This is a new problem. The current episode started in the past 7 days. The problem occurs daily. The problem has been gradually worsening. The cough is non-productive. Associated symptoms include nasal congestion and rhinorrhea. Pertinent negatives include no fever, malaise/fatigue or sweats. Her symptoms are aggravated by change in weather and emotional stress. Her symptoms are alleviated by beta-agonist and ipratropium. She reports minimal improvement on treatment. Her symptoms are not alleviated by beta-agonist. Her past medical history is significant for asthma.  Has been using rescue inhaler every 3 hours and Duoneb nightly.   No flowsheet data found.    Past Medical History:  Diagnosis Date  . Allergy   . Asthma   . Colon polyps   . Diabetes mellitus without complication ([place])   . Gout   . Heart attack ([place]) 07/30/2016   PCI, 2 stents  . Hyperlipidemia   . Hypertension   . Internal hemorrhoids   . Left rotator cuff tear   . 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 Otomycosis of right ear 09/27/2017  . Paroxysmal atrial fibrillation ([facility])   . Thyroid disease    Past Surgical History:  Procedure Laterality Date  . ANGIOPLASTY    . COLONOSCOPY W/ POLYPECTOMY    . HEMORRHOID BANDING     Social History   Tobacco Use  . Smoking status: Never Smoker  . Smokeless tobacco: Never Used  Substance Use Topics  . Alcohol use: Yes    Comment: Rare   family history includes Diabetes in her father and paternal grandmother; Glaucoma in her father; Heart disease in her father; Hyperlipidemia in her father and mother; Hypertension in her father and mother; Skin cancer  in her father.    ROS: negative except as noted in the HPI  Medications: Current Outpatient Medications  Medication Sig Dispense Refill  . albuterol (PROVENTIL HFA;VENTOLIN HFA) 108 (90 Base) MCG/ACT inhaler Inhale 2 puffs into the lungs every 6 (six) hours as needed for wheezing. 2 Inhaler 1  . allopurinol (ZYLOPRIM) 300 MG tablet Take 1 tablet (300 mg total) by mouth 2 (two) times daily. 180 tablet 0  . apixaban (ELIQUIS) 5 MG TABS tablet Take 1 tablet (5 mg total) by mouth 2 (two) times daily. 60 tablet 2  . atenolol (TENORMIN) 100 MG tablet Take 1 tablet (100 mg total) by mouth daily. 90 tablet 1  . atorvastatin (LIPITOR) 80 MG tablet Take 1 tablet (80 mg total) by mouth at bedtime. 90 tablet 1  . blood glucose meter kit and supplies KIT Check morning fasting blood glucose daily and up to 4 times daily as directed 1 each 0  . Cholecalciferol (VITAMIN D3 PO) Take 5,000 mg by mouth daily.    . clopidogrel (PLAVIX) 75 MG tablet TAKE 1 TABLET BY MOUTH  DAILY 90 tablet 1  . COLCRYS 0.6 MG tablet Take 1 tablet (0.6 mg total) by mouth  daily. 30 tablet 3  . Cyanocobalamin (B-12 PO) Take 1,000 mg by mouth daily.    . Dulaglutide (TRULICITY) 0.25 [place]/7.0WC SOPN Inject 0.5 mLs into the skin once a week. 4 pen 11  . empagliflozin (JARDIANCE) 25 MG TABS tablet Take 25 mg by mouth daily. 90 tablet 1  . fluticasone (FLONASE) 50 MCG/ACT nasal spray Place 1 spray into both nostrils daily. 16 g 3  . fluticasone furoate-vilanterol (BREO ELLIPTA) 100-25 MCG/INH AEPB Inhale 1 puff into the lungs daily. 1 each 2  . ipratropium-albuterol (DUONEB) 0.5-2.5 (3) MG/3ML SOLN Take 3 mLs by nebulization every 4 (four) hours as needed. 3 mL 0  . irbesartan-hydrochlorothiazide (AVALIDE) 300-12.5 MG tablet Take 1 tablet by mouth daily. 90 tablet 1  . levocetirizine (XYZAL ALLERGY 24HR) 5 MG tablet Take 1 tablet (5 mg total) by mouth every evening. 90 tablet 3  . levothyroxine (SYNTHROID, LEVOTHROID) 112 MCG tablet Take  1 tablet (112 mcg total) by mouth daily before breakfast. 90 tablet 1  . metFORMIN (GLUCOPHAGE) 1000 MG tablet Take 1 tablet (1,000 mg total) by mouth 2 (two) times daily with a meal. 180 tablet 3  . montelukast (SINGULAIR) 10 MG tablet Take 1 tablet (10 mg total) by mouth at bedtime. 90 tablet 3  . NEBULIZER SYSTEM ALL-IN-ONE MISC 1 Device by Does not apply route every 4 (four) hours as needed. 1 each 0  . nitroGLYCERIN (NITROSTAT) 0.4 MG SL tablet Place 1 tablet (0.4 mg total) under the tongue every 5 (five) minutes as needed for chest pain (or tightness). 30 tablet 0  . Omega-3 Fatty Acids (FISH OIL PO) Take 5,000 Units by mouth daily.     No current facility-administered medications for this visit.    Allergies  Allergen Reactions  . Latex   . Sulfa Antibiotics Rash       Objective:  BP 137/80   Pulse (!) 110   Temp 98 F (36.7 C)   Wt 186 lb (84.4 kg)   SpO2 92%   BMI 31.93 kg/m  Gen:  alert, not ill-appearing, no distress, appropriate for age 32: head normocephalic without obvious abnormality, conjunctiva and cornea clear, wearing glasses, nasal mucosa edematous, no sinus tenderness, oropharynx clear, neck supple, no cervical adenopathy, trachea midline Pulm: Normal work of breathing, normal phonation, inspiratory and expiratory wheezes of the upper lobes bilaterally CV: tachycardic rate, regular rhythm, s1 and s2 distinct, no murmurs, clicks or rubs  Neuro: alert and oriented x 3, no tremor MSK: extremities atraumatic, normal gait and station Skin: intact, no rashes on exposed skin, no jaundice, no cyanosis    No results found for this or any previous visit (from the past 72 hour(s)). No results found.    Assessment and Plan: 72 y.o. female with   .Diagnoses and all orders for this visit:  Moderate persistent asthma with acute exacerbation -     DG Chest 2 View -     predniSONE (DELTASONE) 50 MG tablet; Take 1 tablet (50 mg total) by mouth  daily.  Non-seasonal allergic rhinitis, unspecified trigger -     montelukast (SINGULAIR) 10 MG tablet; Take 1 tablet (10 mg total) by mouth at bedtime. -     levocetirizine (XYZAL ALLERGY 24HR) 5 MG tablet; Take 1 tablet (5 mg total) by mouth every evening. -     fluticasone (FLONASE) 50 MCG/ACT nasal spray; Place 1 spray into both nostrils daily.  Hypoxia -     DG Chest 2 View   Afebrile,  no tachypnea, mild tachycardia at 110, which may be due to increased use of beta agonist Duoneb given in office today. SpO2 improved from 92% to 97% on RA at rest Asthma exacerbation that appears to have been triggered by environmental allergies Starting Singulair, Xyzal and Flonase Prednisone burst x 5 days Patient instructed she may schedule DuoNebs morning and evening until breathing improves Continue Albuterol 2 puffs Q4H prn Due to tachycardia will obtain CXR to assess for infiltrate Influenza vaccine deferred today due to respiratory illness   Patient education and anticipatory guidance given Patient agrees with treatment plan Follow-up in 4 days for asthma exacerbation or sooner as needed if symptoms worsen or fail to improve  Darlyne Russian PA-C

## 2018-08-26 ENCOUNTER — Encounter: Payer: Self-pay | Admitting: Physician Assistant

## 2018-08-26 DIAGNOSIS — J3089 Other allergic rhinitis: Secondary | ICD-10-CM | POA: Insufficient documentation

## 2018-08-28 ENCOUNTER — Encounter: Payer: Self-pay | Admitting: Sports Medicine

## 2018-08-28 ENCOUNTER — Ambulatory Visit (INDEPENDENT_AMBULATORY_CARE_PROVIDER_SITE_OTHER): Payer: Medicare HMO | Admitting: Sports Medicine

## 2018-08-28 DIAGNOSIS — M1A0790 Idiopathic chronic gout, unspecified ankle and foot, without tophus (tophi): Secondary | ICD-10-CM

## 2018-08-28 DIAGNOSIS — M65322 Trigger finger, left index finger: Secondary | ICD-10-CM

## 2018-08-28 DIAGNOSIS — M720 Palmar fascial fibromatosis [Dupuytren]: Secondary | ICD-10-CM | POA: Diagnosis not present

## 2018-08-28 MED ORDER — ALLOPURINOL 300 MG PO TABS: 300.0000 mg | ORAL_TABLET | Freq: Two times a day (BID) | ORAL | 0 refills | Status: DC

## 2018-08-28 MED ORDER — ALLOPURINOL 300 MG PO TABS: 300.0000 mg | ORAL_TABLET | Freq: Two times a day (BID) | ORAL | 3 refills | Status: DC

## 2018-08-28 NOTE — Assessment & Plan Note (Signed)
Asymptomatic. 

## 2018-08-28 NOTE — Assessment & Plan Note (Signed)
No further flares with Colcrys and allopurinol 300 twice a day. Most recent uric acid levels were 6.9, rechecking today, goal is between 5 and 6 or less.

## 2018-08-28 NOTE — Assessment & Plan Note (Signed)
We discussed home hand therapy, she has no pain, just occasional catching in the morning. If this becomes more symptomatic we will consider flexor tendon sheath injection.

## 2018-08-28 NOTE — Progress Notes (Signed)
Subjective:    CC: Follow-up  HPI: Gout: Has not had a flare since we started colchicine, she continues with allopurinol 300 twice a day.  Hand issue: Has a nodularity on the volar aspect of her left hand.  Nontender.  Trigger finger: Left index finger tends to lock in flexion in the morning, she pops it straight and has no further problems for the rest of the day.  I reviewed the past medical history, family history, social history, surgical history, and allergies today and no changes were needed.  Please see the problem list section below in epic for further details.  Past Medical History: Past Medical History:  Diagnosis Date  . Allergy   . Asthma   . Colon polyps   . Diabetes mellitus without complication (HCC)   . Gout   . Heart attack ([facility]) 07/30/2016   PCI, 2 stents  . Hyperlipidemia   . Hypertension   . Internal hemorrhoids   . Left rotator cuff tear   . 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 Otomycosis of right ear 09/27/2017  . Paroxysmal atrial fibrillation (HCC)   . Thyroid disease    Past Surgical History: Past Surgical History:  Procedure Laterality Date  . ANGIOPLASTY    . COLONOSCOPY W/ POLYPECTOMY    . HEMORRHOID BANDING     Social History: Social History   Socioeconomic History  . Marital status: Divorced    Spouse name: Not on file  . Number of children: 2  . Years of education: Not on file  . Highest education level: Not on file  Occupational History  . Not on file  Social Needs  . Financial resource strain: Not on file  . Food insecurity:    Worry: Not on file    Inability: Not on file  . Transportation needs:    Medical: Not on file    Non-medical: Not on file  Tobacco Use  . Smoking status: Never Smoker  . Smokeless tobacco: Never Used  Substance and Sexual Activity  . Alcohol use: Yes    Comment: Rare  . Drug use: No  . Sexual activity: Not Currently  Lifestyle  . Physical activity:    Days per week: Not on file    Minutes per  session: Not on file  . Stress: Not on file  Relationships  . Social connections:    Talks on phone: Not on file    Gets together: Not on file    Attends religious service: Not on file    Active member of club or organization: Not on file    Attends meetings of clubs or organizations: Not on file    Relationship status: Not on file  Other Topics Concern  . Not on file  Social History Narrative  . Not on file   Family History: Family History  Problem Relation Age of Onset  . Hypertension Mother   . Hyperlipidemia Mother   . Glaucoma Father   . Heart disease Father   . Hypertension Father   . Diabetes Father   . Hyperlipidemia Father   . Skin cancer Father   . Diabetes Paternal Grandmother    Allergies: Allergies  Allergen Reactions  . Latex   . Sulfa Antibiotics Rash   Medications: See med rec.  Review of Systems: No fevers, chills, night sweats, weight loss, chest pain, or shortness of breath.   Objective:    General: Well Developed, well nourished, and in no acute distress.  Neuro: Alert and oriented  x3, extra-ocular muscles intact, sensation grossly intact.  HEENT: Normocephalic, atraumatic, pupils equal round reactive to light, neck supple, no masses, no lymphadenopathy, thyroid nonpalpable.  Skin: Warm and dry, no rashes. Cardiac: Regular rate and rhythm, no murmurs rubs or gallops, no lower extremity edema.  Respiratory: Clear to auscultation bilaterally. Not using accessory muscles, speaking in full sentences. Left hand: Palmar Dupuytren's contracture, also with a trigger left index finger.  Nontender.  Impression and Recommendations:    Gout of ankle No further flares with Colcrys and allopurinol 300 twice a day. Most recent uric acid levels were 6.9, rechecking today, goal is between 5 and 6 or less.  Dupuytren's contracture of left hand Asymptomatic  Trigger finger, left index finger We discussed home hand therapy, she has no pain, just occasional  catching in the morning. If this becomes more symptomatic we will consider flexor tendon sheath injection. ___________________________________________ Ihor Austin. Benjamin Stain, M.D., ABFM., CAQSM. Primary Care and Sports Medicine [facility]  Adjunct Instructor of Family Medicine  [employer]

## 2018-08-29 ENCOUNTER — Ambulatory Visit (INDEPENDENT_AMBULATORY_CARE_PROVIDER_SITE_OTHER): Payer: Medicare HMO | Admitting: Physician Assistant

## 2018-08-29 ENCOUNTER — Encounter: Payer: Self-pay | Admitting: Physician Assistant

## 2018-08-29 VITALS — BP 161/91 | HR 95 | Temp 98.1°F | Resp 14 | Wt 187.0 lb

## 2018-08-29 DIAGNOSIS — J4541 Moderate persistent asthma with (acute) exacerbation: Secondary | ICD-10-CM | POA: Diagnosis not present

## 2018-08-29 LAB — URINALYSIS, ROUTINE W REFLEX MICROSCOPIC
BILIRUBIN URINE: NEGATIVE
HGB URINE DIPSTICK: NEGATIVE
KETONES UR: NEGATIVE
Leukocytes, UA: NEGATIVE
NITRITE: NEGATIVE
Protein, ur: NEGATIVE
Specific Gravity, Urine: 1.018 (ref 1.001–1.03)
pH: 6 (ref 5.0–8.0)

## 2018-08-29 NOTE — Progress Notes (Signed)
HPI:                                                                Tara Campbell is a 72 y.o. female who presents to [facility]: [facility] today for asthma follow-up  Tara Campbell was treated for acute asthma exacerbation 4 days ago with prednisone burst and duonebs bid. CXR was negative. She reports she is feeling much better. Breathing has improved, she has more energy, she is not using rescue inhaler, she has been doing a Duoneb around 3:30 am for wheezing/chest tightness. She also had some irritability with prednisone on day 1, so she divided her dose 30 mg in the morning and 20 mg in the afternoon. This works well for her. Denies fever, chills, malaise, productive cough, or dyspnea.     No flowsheet data found.    Past Medical History:  Diagnosis Date  . Allergy   . Asthma   . Colon polyps   . Diabetes mellitus without complication ([place])   . Gout   . Heart attack ([place]) 07/30/2016   PCI, 2 stents  . Hyperlipidemia   . Hypertension   . Internal hemorrhoids   . Left rotator cuff tear   . 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 Otomycosis of right ear 09/27/2017  . Paroxysmal atrial fibrillation ([facility])   . Thyroid disease    Past Surgical History:  Procedure Laterality Date  . ANGIOPLASTY    . COLONOSCOPY W/ POLYPECTOMY    . HEMORRHOID BANDING     Social History   Tobacco Use  . Smoking status: Never Smoker  . Smokeless tobacco: Never Used  Substance Use Topics  . Alcohol use: Yes    Comment: Rare   family history includes Diabetes in her father and paternal grandmother; Glaucoma in her father; Heart disease in her father; Hyperlipidemia in her father and mother; Hypertension in her father and mother; Skin cancer in her father.    ROS: negative except as noted in the HPI  Medications: Current Outpatient Medications  Medication Sig Dispense Refill  . albuterol (PROVENTIL HFA;VENTOLIN HFA) 108 (90 Base) MCG/ACT inhaler Inhale 2  puffs into the lungs every 6 (six) hours as needed for wheezing. 2 Inhaler 1  . allopurinol (ZYLOPRIM) 300 MG tablet Take 1 tablet (300 mg total) by mouth 2 (two) times daily. 180 tablet 3  . apixaban (ELIQUIS) 5 MG TABS tablet Take 1 tablet (5 mg total) by mouth 2 (two) times daily. 60 tablet 2  . atenolol (TENORMIN) 100 MG tablet Take 1 tablet (100 mg total) by mouth daily. 90 tablet 1  . atorvastatin (LIPITOR) 80 MG tablet Take 1 tablet (80 mg total) by mouth at bedtime. 90 tablet 1  . blood glucose meter kit and supplies KIT Check morning fasting blood glucose daily and up to 4 times daily as directed 1 each 0  . Cholecalciferol (VITAMIN D3 PO) Take 5,000 mg by mouth daily.    . clopidogrel (PLAVIX) 75 MG tablet TAKE 1 TABLET BY MOUTH  DAILY 90 tablet 1  . COLCRYS 0.6 MG tablet Take 1 tablet (0.6 mg total) by mouth daily. 30 tablet 3  . Cyanocobalamin (B-12 PO) Take 1,000 mg by mouth daily.    . Dulaglutide (  TRULICITY) 7.41 OI/7.8MV SOPN Inject 0.5 mLs into the skin once a week. 4 pen 11  . empagliflozin (JARDIANCE) 25 MG TABS tablet Take 25 mg by mouth daily. 90 tablet 1  . fluticasone (FLONASE) 50 MCG/ACT nasal spray Place 1 spray into both nostrils daily. 16 g 3  . fluticasone furoate-vilanterol (BREO ELLIPTA) 100-25 MCG/INH AEPB Inhale 1 puff into the lungs daily. 1 each 2  . ipratropium-albuterol (DUONEB) 0.5-2.5 (3) MG/3ML SOLN Take 3 mLs by nebulization every 4 (four) hours as needed. 3 mL 0  . irbesartan-hydrochlorothiazide (AVALIDE) 300-12.5 MG tablet Take 1 tablet by mouth daily. 90 tablet 1  . levocetirizine (XYZAL ALLERGY 24HR) 5 MG tablet Take 1 tablet (5 mg total) by mouth every evening. 90 tablet 3  . levothyroxine (SYNTHROID, LEVOTHROID) 112 MCG tablet Take 1 tablet (112 mcg total) by mouth daily before breakfast. 90 tablet 1  . metFORMIN (GLUCOPHAGE) 1000 MG tablet Take 1 tablet (1,000 mg total) by mouth 2 (two) times daily with a meal. 180 tablet 3  . montelukast (SINGULAIR)  10 MG tablet Take 1 tablet (10 mg total) by mouth at bedtime. 90 tablet 3  . NEBULIZER SYSTEM ALL-IN-ONE MISC 1 Device by Does not apply route every 4 (four) hours as needed. 1 each 0  . nitroGLYCERIN (NITROSTAT) 0.4 MG SL tablet Place 1 tablet (0.4 mg total) under the tongue every 5 (five) minutes as needed for chest pain (or tightness). 30 tablet 0  . Omega-3 Fatty Acids (FISH OIL PO) Take 5,000 Units by mouth daily.    . predniSONE (DELTASONE) 50 MG tablet Take 1 tablet (50 mg total) by mouth daily. 5 tablet 0   No current facility-administered medications for this visit.    Allergies  Allergen Reactions  . Latex   . Sulfa Antibiotics Rash       Objective:  BP (!) 161/91   Pulse 95   Temp 98.1 F (36.7 C)   Resp 14   Wt 187 lb (84.8 kg)   SpO2 95%   BMI 32.10 kg/m  Gen:  alert, not ill-appearing, no distress, appropriate for age, obese female HEENT: head normocephalic without obvious abnormality, conjunctiva and cornea clear, wearing glasses, trachea midline Pulm: Normal work of breathing, normal phonation, clear to auscultation bilaterally, no wheezes, rales or rhonchi CV: Normal rate, regular rhythm, s1 and s2 distinct, no murmurs, clicks or rubs  Neuro: alert and oriented x 3, no tremor MSK: extremities atraumatic, normal gait and station Skin: intact, no rashes on exposed skin, no jaundice, no cyanosis    No results found for this or any previous visit (from the past 72 hour(s)). Dg Chest 2 View  Result Date: 08/25/2018 CLINICAL DATA:  Pt with dyspnea, wheezing, tachycardia, hypoxia. Moderate persistant asthma. For 1 week. Non-smoker. EXAM: CHEST - 2 VIEW COMPARISON:  Chest x-ray dated 03/07/2018. FINDINGS: The heart size and mediastinal contours are within normal limits. Both lungs are clear. Mild degenerative spondylitic changes throughout the thoracic spine. No acute or suspicious osseous finding IMPRESSION: No active cardiopulmonary disease. No evidence of pneumonia  or pulmonary edema. Electronically Signed   By: Franki Cabot M.D.   On: 08/25/2018 16:42      Assessment and Plan: 72 y.o. female with   .Tara Campbell was seen today for follow-up.  Diagnoses and all orders for this visit:  Moderate persistent asthma with acute exacerbation   - clinically improved with steroid burst - afebrile, no tachypnea, no tachycardia, SpO2 95% on RA at rest  today - she is hypertensive, likely due to the steroid burst. Cont current medications  - she was not doing a Duoneb at bedtime, so I recommended she start this at least for the next several days until breathing has returned to baseline - cont prednisone  Patient education and anticipatory guidance given Patient agrees with treatment plan Follow-up as needed if symptoms worsen or fail to improve  Darlyne Russian PA-C

## 2018-09-01 NOTE — Addendum Note (Signed)
Addended by: [name] E on: 09/01/2018 04:51 PM   Modules accepted: Orders

## 2018-09-24 ENCOUNTER — Other Ambulatory Visit: Payer: Medicare HMO

## 2018-09-24 ENCOUNTER — Ambulatory Visit: Payer: Medicare HMO

## 2018-10-13 ENCOUNTER — Ambulatory Visit: Payer: Medicare HMO | Admitting: Adult Health

## 2018-10-13 ENCOUNTER — Encounter: Payer: Self-pay | Admitting: Adult Health

## 2018-10-13 ENCOUNTER — Telehealth: Payer: Self-pay | Admitting: Physician Assistant

## 2018-10-13 VITALS — BP 114/70 | HR 79 | Temp 97.9°F | Ht 64.0 in | Wt 190.4 lb

## 2018-10-13 DIAGNOSIS — J4550 Severe persistent asthma, uncomplicated: Secondary | ICD-10-CM | POA: Diagnosis not present

## 2018-10-13 DIAGNOSIS — R590 Localized enlarged lymph nodes: Secondary | ICD-10-CM | POA: Insufficient documentation

## 2018-10-13 DIAGNOSIS — J4540 Moderate persistent asthma, uncomplicated: Secondary | ICD-10-CM

## 2018-10-13 DIAGNOSIS — J3089 Other allergic rhinitis: Secondary | ICD-10-CM

## 2018-10-13 LAB — NITRIC OXIDE: Nitric Oxide: 92

## 2018-10-13 MED ORDER — TIOTROPIUM BROMIDE MONOHYDRATE 2.5 MCG/ACT IN AERS: 2.0000 | INHALATION_SPRAY | Freq: Every day | RESPIRATORY_TRACT | 0 refills | Status: DC

## 2018-10-13 MED ORDER — TIOTROPIUM BROMIDE MONOHYDRATE 2.5 MCG/ACT IN AERS: 2.0000 | INHALATION_SPRAY | Freq: Every day | RESPIRATORY_TRACT | 5 refills | Status: DC

## 2018-10-13 MED ORDER — FLUTICASONE FUROATE-VILANTEROL 200-25 MCG/INH IN AEPB: 1.0000 | INHALATION_SPRAY | Freq: Every day | RESPIRATORY_TRACT | 5 refills | Status: DC

## 2018-10-13 MED ORDER — FLUTICASONE FUROATE-VILANTEROL 200-25 MCG/INH IN AEPB: 1.0000 | INHALATION_SPRAY | Freq: Every day | RESPIRATORY_TRACT | 0 refills | Status: DC

## 2018-10-13 MED ORDER — PREDNISONE 10 MG PO TABS: ORAL_TABLET | ORAL | 1 refills | Status: DC

## 2018-10-13 NOTE — Telephone Encounter (Signed)
Called and spoke to patient at 8:40 AM today regarding CT chest results from 10/09/2018.  This was ordered for incidental finding of mediastinal and bihilar adenopathy on CT angiogram at [facility] during patient's hospitalization in February 2018.   The results of the CT shows there is persistent mild mediastinal and bihilar lymph node enlargement which is nonspecific.  Radiology recommended considering follow-up chest CT within 6 months along with correlation with serology. I routed the result to patient's pulmonologist Dr. Kendrick Fries and asked that he take over surveillance of the adenopathy. During the phone call patient also mentioned that her asthma has not been well controlled, she has been using her nebulizer every 4 hours for the last week.  She has an appointment with the nurse practitioner at her pulmonology practice this morning.

## 2018-10-13 NOTE — Patient Instructions (Signed)
Prednisone taper over the next week Albuterol inhaler 2 puffs every 4 hours as needed for wheezing Increase Breo 200 1 puff daily, rinse after use Restart Spiriva 2 puffs daily, rinse after use Take Xyzal daily Continue on Flonase 2 puffs daily Continue on Singulair daily Follow-up with Dr. Kendrick Fries or Evona Westra NP in 4 weeks and As needed   Please contact office for sooner follow up if symptoms do not improve or worsen or seek emergency care

## 2018-10-13 NOTE — Progress Notes (Deleted)
Patient seen in the office today and instructed on use of Spiriva Respimat 2.4mcg.  Patient expressed understanding and demonstrated technique. Boone Master, CMA 10/13/18

## 2018-10-13 NOTE — Progress Notes (Signed)
Reviewed, agree 

## 2018-10-13 NOTE — Assessment & Plan Note (Signed)
Moderate to severe persistent asthma with recurrent exacerbation over the last 6 weeks.  Unfortunately patient has underlying diabetes.  I am worried about frequent steroid use.  Patient education on prednisone. For now will increase Breo dosing.  Add back in Spiriva.  And treat with a short-term of steroids.  On return if continues to be symptomatic along with frequent flares.  Will need to look at more aggressive therapy with Eliberto Ivory , etc.  Control for triggers  Patient is somewhat physically deconditioned.  Would benefit from pulmonary rehab discussed with her on return will consider referral for pulmonary rehab.  Plan  Patient Instructions  Prednisone taper over the next week Albuterol inhaler 2 puffs every 4 hours as needed for wheezing Increase Breo 200 1 puff daily, rinse after use Restart Spiriva 2 puffs daily, rinse after use Take Xyzal daily Continue on Flonase 2 puffs daily Continue on Singulair daily Follow-up with Dr. Kendrick Fries or Zarielle Cea NP in 4 weeks and As needed   Please contact office for sooner follow up if symptoms do not improve or worsen or seek emergency care

## 2018-10-13 NOTE — Addendum Note (Signed)
Addended by: Boone Master E on: 10/13/2018 12:08 PM   Modules accepted: Orders

## 2018-10-13 NOTE — Progress Notes (Signed)
_0  ID: Tara Campbell, female    DOB: 25-Jul-1946, 72 y.o.   MRN: [id number]  Chief Complaint  Patient presents with  . Acute Visit    Asthma    Referring provider: Ottis Stain*  HPI: 72 year old female never smoker seen for pulmonary consult January 2019 for asthma Medical history  significant for diabetes, hypertension  TEST/EVENTS :  PFT: 01/2018 PFT: Ratio 69%, FEV1 1.24L 57% pred, total lung capacity 4.04 L 80% predicted, DLCO 20.06 mL 82% predicted  12/2017 CBC: absolute eos count 1000 cell/dL 12/2017 IgE 714  10/13/2018 Acute OV : Asthma , Abnormal CT chest  Patient presents for an acute office visit.  Patient says over the last couple months her asthma has not been as well controlled.  She was treated for an asthma exacerbation 6 weeks ago with prednisone.  Symptoms did get better.  Over the last several days she has had increased cough wheezing and shortness of breath.  She does have some postnasal drainage and nasal stuffiness.  Seems to be chronic in nature.  Is on Singulair daily.  Has been using Flonase and Xyzal some. she is currently on Breo 100 daily.  Earlier this year Spiriva was stopped.  She does feel that her breathing has worsened since being off of Spiriva.  Previous PFT in February of this year showed moderate airflow obstruction.  She says that she has had asthma since her early 53s.  sHe denies any hemoptysis, chest pain, orthopnea, increased leg swelling. Symptoms seem to be worse when she is under stress.  Albuterol does seem to help. For the last few days symptoms have been worse and she had to use her nebulizer. Exhaled nitric oxide testing today is elevated at 92ppb   Patient is independent.  Does not exercise on a routine basis.  Is able to do light house cleaning.  If she has heavy activity has to rest due to shortness of breath..  She did have some borderline lymphadenopathy noted on CT scan 01/2017 .  A repeat CT chest done at an  outlying facility was done last week.  This showed clear lungs.  Mild Bilateral hilar and mediastinal adenopathy.  This was felt to be nonspecific.  She is leaving for a trip to [place] later this month.   Allergies  Allergen Reactions  . Latex   . Sulfa Antibiotics Rash    Immunization History  Administered Date(s) Administered  . Influenza, High Dose Seasonal PF 08/29/2017, 09/07/2018  . Pneumococcal Polysaccharide-23 08/29/2017  . Tdap 05/29/2017  . Zoster Recombinat (Shingrix) 02/28/2018    Past Medical History:  Diagnosis Date  . Allergy   . Asthma   . Colon polyps   . Diabetes mellitus without complication ([place])   . Gout   . Heart attack ([place]) 07/30/2016   PCI, 2 stents  . Hyperlipidemia   . Hypertension   . Internal hemorrhoids   . Left rotator cuff tear   . 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 Otomycosis of right ear 09/27/2017  . Paroxysmal atrial fibrillation ([facility])   . Thyroid disease     Tobacco History: Social History   Tobacco Use  Smoking Status Never Smoker  Smokeless Tobacco Never Used   Counseling given: Not Answered   Outpatient Medications Prior to Visit  Medication Sig Dispense Refill  . albuterol (PROVENTIL HFA;VENTOLIN HFA) 108 (90 Base) MCG/ACT inhaler Inhale 2 puffs into the lungs every 6 (six) hours as needed for wheezing. 2 Inhaler 1  .  allopurinol (ZYLOPRIM) 300 MG tablet Take 1 tablet (300 mg total) by mouth 2 (two) times daily. 180 tablet 3  . apixaban (ELIQUIS) 5 MG TABS tablet Take 1 tablet (5 mg total) by mouth 2 (two) times daily. 60 tablet 2  . atenolol (TENORMIN) 100 MG tablet Take 1 tablet (100 mg total) by mouth daily. 90 tablet 1  . atorvastatin (LIPITOR) 80 MG tablet Take 1 tablet (80 mg total) by mouth at bedtime. 90 tablet 1  . blood glucose meter kit and supplies KIT Check morning fasting blood glucose daily and up to 4 times daily as directed 1 each 0  . Cholecalciferol (VITAMIN D3 PO) Take 5,000 mg by mouth daily.    .  clopidogrel (PLAVIX) 75 MG tablet TAKE 1 TABLET BY MOUTH  DAILY 90 tablet 1  . COLCRYS 0.6 MG tablet Take 1 tablet (0.6 mg total) by mouth daily. 30 tablet 3  . Cyanocobalamin (B-12 PO) Take 1,000 mg by mouth daily.    . Dulaglutide (TRULICITY) 8.32 PQ/9.8YM SOPN Inject 0.5 mLs into the skin once a week. 4 pen 11  . empagliflozin (JARDIANCE) 25 MG TABS tablet Take 25 mg by mouth daily. 90 tablet 1  . fluticasone (FLONASE) 50 MCG/ACT nasal spray Place 1 spray into both nostrils daily. 16 g 3  . ipratropium-albuterol (DUONEB) 0.5-2.5 (3) MG/3ML SOLN Take 3 mLs by nebulization every 4 (four) hours as needed. 3 mL 0  . irbesartan-hydrochlorothiazide (AVALIDE) 300-12.5 MG tablet Take 1 tablet by mouth daily. 90 tablet 1  . levocetirizine (XYZAL ALLERGY 24HR) 5 MG tablet Take 1 tablet (5 mg total) by mouth every evening. 90 tablet 3  . levothyroxine (SYNTHROID, LEVOTHROID) 112 MCG tablet Take 1 tablet (112 mcg total) by mouth daily before breakfast. 90 tablet 1  . metFORMIN (GLUCOPHAGE) 1000 MG tablet Take 1 tablet (1,000 mg total) by mouth 2 (two) times daily with a meal. 180 tablet 3  . montelukast (SINGULAIR) 10 MG tablet Take 1 tablet (10 mg total) by mouth at bedtime. 90 tablet 3  . NEBULIZER SYSTEM ALL-IN-ONE MISC 1 Device by Does not apply route every 4 (four) hours as needed. 1 each 0  . nitroGLYCERIN (NITROSTAT) 0.4 MG SL tablet Place 1 tablet (0.4 mg total) under the tongue every 5 (five) minutes as needed for chest pain (or tightness). 30 tablet 0  . Omega-3 Fatty Acids (FISH OIL PO) Take 5,000 Units by mouth daily.    . fluticasone furoate-vilanterol (BREO ELLIPTA) 100-25 MCG/INH AEPB Inhale 1 puff into the lungs daily. 1 each 2  . predniSONE (DELTASONE) 50 MG tablet Take 1 tablet (50 mg total) by mouth daily. (Patient not taking: Reported on 10/13/2018) 5 tablet 0   No facility-administered medications prior to visit.      Review of Systems  Constitutional:   No  weight loss, night  sweats,  Fevers, chills, +fatigue, or  lassitude.  HEENT:   No headaches,  Difficulty swallowing,  Tooth/dental problems, or  Sore throat,                No sneezing, itching, ear ache, + nasal congestion, post nasal drip,   CV:  No chest pain,  Orthopnea, PND, swelling in lower extremities, anasarca, dizziness, palpitations, syncope.   GI  No heartburn, indigestion, abdominal pain, nausea, vomiting, diarrhea, change in bowel habits, loss of appetite, bloody stools.   Resp:   No chest wall deformity  Skin: no rash or lesions.  GU: no dysuria, change  in color of urine, no urgency or frequency.  No flank pain, no hematuria   MS:  No joint pain or swelling.  No decreased range of motion.  No back pain.    Physical Exam  BP 114/70 (BP Location: Left Arm, Cuff Size: Normal)   Pulse 79   Temp 97.9 F (36.6 C) (Oral)   Ht 5' 4" (1.626 m)   Wt 190 lb 6.4 oz (86.4 kg)   SpO2 94%   BMI 32.68 kg/m   GEN: A/Ox3; pleasant , NAD,  Obese    HEENT:  [place]/AT,  EACs-clear, TMs-wnl, NOSE-clear drainage  THROAT-clear, no lesions, no postnasal drip or exudate noted.   NECK:  Supple w/ fair ROM; no JVD; normal carotid impulses w/o bruits; no thyromegaly or nodules palpated; no lymphadenopathy.  No stridor  RESP  Scattered wheezing , speaks in full sentences without apparent difficulty   no accessory muscle use, no dullness to percussion  CARD:  RRR, no m/r/g, no peripheral edema, pulses intact, no cyanosis or clubbing.  GI:   Soft & nt; nml bowel sounds; no organomegaly or masses detected.   Musco: Warm bil, no deformities or joint swelling noted.   Neuro: alert, no focal deficits noted.    Skin: Warm, no lesions or rashes    Lab Results:  CBC    Component Value Date/Time   WBC 7.7 08/05/2018 1022   RBC 4.35 08/05/2018 1022   HGB 12.8 08/05/2018 1022   HGB 12.8 12/26/2017 1630   HCT 37.2 08/05/2018 1022   HCT 37.3 12/26/2017 1630   PLT 330 08/05/2018 1022   MCV 85.5  08/05/2018 1022   MCV 86 12/26/2017 1630   MCH 29.4 08/05/2018 1022   MCHC 34.4 08/05/2018 1022   RDW 13.2 08/05/2018 1022   RDW 13.5 12/26/2017 1630   LYMPHSABS 1,964 08/05/2018 1022   LYMPHSABS 1.8 12/26/2017 1630   MONOABS 1.0 03/07/2018 2159   EOSABS 809 (H) 08/05/2018 1022   EOSABS 1.0 (H) 12/26/2017 1630   BASOSABS 62 08/05/2018 1022   BASOSABS 0.1 12/26/2017 1630    BMET    Component Value Date/Time   NA 139 08/05/2018 1022   K 4.1 08/05/2018 1022   CL 105 08/05/2018 1022   CO2 24 08/05/2018 1022   GLUCOSE 93 08/05/2018 1022   BUN 15 08/05/2018 1022   CREATININE 1.04 (H) 08/05/2018 1022   CALCIUM 10.3 08/05/2018 1022   GFRNONAA 53 (L) 08/05/2018 1022   GFRAA 61 08/05/2018 1022    BNP No results found for: BNP  ProBNP No results found for: PROBNP  Imaging: No results found.    PFT Results Latest Ref Rng & Units 01/30/2018  FVC-Pre L 1.74  FVC-Predicted Pre % 60  FVC-Post L 1.80  FVC-Predicted Post % 63  Pre FEV1/FVC % % 67  Post FEV1/FCV % % 69  FEV1-Pre L 1.17  FEV1-Predicted Pre % 54  FEV1-Post L 1.24  DLCO UNC% % 82  DLCO COR %Predicted % 118  TLC L 4.04  TLC % Predicted % 80  RV % Predicted % 87    Lab Results  Component Value Date   NITRICOXIDE 92 10/13/2018        Assessment & Plan:   Moderate persistent asthma Moderate to severe persistent asthma with recurrent exacerbation over the last 6 weeks.  Unfortunately patient has underlying diabetes.  I am worried about frequent steroid use.  Patient education on prednisone. For now will increase Breo dosing.  Add  back in Spiriva.  And treat with a short-term of steroids.  On return if continues to be symptomatic along with frequent flares.  Will need to look at more aggressive therapy with Pura Spice , etc.  Control for triggers  Patient is somewhat physically deconditioned.  Would benefit from pulmonary rehab discussed with her on return will consider referral for pulmonary  rehab.  Plan  Patient Instructions  Prednisone taper over the next week Albuterol inhaler 2 puffs every 4 hours as needed for wheezing Increase Breo 200 1 puff daily, rinse after use Restart Spiriva 2 puffs daily, rinse after use Take Xyzal daily Continue on Flonase 2 puffs daily Continue on Singulair daily Follow-up with Dr. Lake Bells or  NP in 4 weeks and As needed   Please contact office for sooner follow up if symptoms do not improve or worsen or seek emergency care       Mediastinal adenopathy Mild mediastinal and hilar adenopathy noted on CT chest.  It appears nonspecific with no other concerning issues noted.  Will repeat CT chest in 6 months.  Ideally would like to have this done when she is not ill if possible. BMET prior to scan .    Non-seasonal allergic rhinitis Control for triggers  Cont on xyzal , flonase and singulair      Rexene Edison, NP 10/13/2018

## 2018-10-13 NOTE — Assessment & Plan Note (Signed)
Mild mediastinal and hilar adenopathy noted on CT chest.  It appears nonspecific with no other concerning issues noted.  Will repeat CT chest in 6 months.  Ideally would like to have this done when she is not ill if possible. BMET prior to scan .

## 2018-10-13 NOTE — Assessment & Plan Note (Signed)
Control for triggers  Cont on xyzal , flonase and singulair

## 2018-10-24 ENCOUNTER — Ambulatory Visit: Payer: Medicare HMO | Admitting: Physician Assistant

## 2018-10-25 ENCOUNTER — Other Ambulatory Visit: Payer: Self-pay | Admitting: Adult Health

## 2018-10-27 ENCOUNTER — Ambulatory Visit: Payer: Medicare HMO | Admitting: Physician Assistant

## 2018-11-05 ENCOUNTER — Other Ambulatory Visit: Payer: Medicare HMO

## 2018-11-05 ENCOUNTER — Ambulatory Visit: Payer: Medicare HMO

## 2018-11-10 ENCOUNTER — Ambulatory Visit: Payer: Medicare HMO | Admitting: Adult Health

## 2018-11-12 ENCOUNTER — Ambulatory Visit (INDEPENDENT_AMBULATORY_CARE_PROVIDER_SITE_OTHER): Payer: Medicare HMO

## 2018-11-12 DIAGNOSIS — Z1231 Encounter for screening mammogram for malignant neoplasm of breast: Secondary | ICD-10-CM | POA: Diagnosis not present

## 2018-11-12 DIAGNOSIS — Z13820 Encounter for screening for osteoporosis: Secondary | ICD-10-CM | POA: Diagnosis not present

## 2018-11-12 DIAGNOSIS — Z780 Asymptomatic menopausal state: Secondary | ICD-10-CM

## 2018-11-14 ENCOUNTER — Encounter: Payer: Self-pay | Admitting: Adult Health

## 2018-11-14 ENCOUNTER — Ambulatory Visit: Payer: Medicare HMO | Admitting: Adult Health

## 2018-11-14 VITALS — BP 116/64 | HR 91 | Temp 97.1°F | Ht 62.5 in | Wt 193.0 lb

## 2018-11-14 DIAGNOSIS — R590 Localized enlarged lymph nodes: Secondary | ICD-10-CM | POA: Diagnosis not present

## 2018-11-14 DIAGNOSIS — J3089 Other allergic rhinitis: Secondary | ICD-10-CM

## 2018-11-14 DIAGNOSIS — J4550 Severe persistent asthma, uncomplicated: Secondary | ICD-10-CM

## 2018-11-14 DIAGNOSIS — J4540 Moderate persistent asthma, uncomplicated: Secondary | ICD-10-CM

## 2018-11-14 LAB — NITRIC OXIDE: NITRIC OXIDE: 49

## 2018-11-14 NOTE — Assessment & Plan Note (Signed)
Mild mediastinal and hilar adenopathy noted on CT scan during acute illness.  Will repeat CT scan in 4 months to follow-up.

## 2018-11-14 NOTE — Assessment & Plan Note (Signed)
Continue on current regimen .   

## 2018-11-14 NOTE — Assessment & Plan Note (Signed)
Recent exacerbation now improved after steroids and adjustment of maintenance medication regimen.  Patient clearly has an allergic component with increased eosinophils on CBC smear, elevated nitric oxide testing during acute flare.  Simple spirometry did show improvement in FEV1 today which is consistent with an asthmatic component. Patient is clinically improved.  Continue on current regimen.  If continues to have flares requiring steroids. Need to consider possible Biologics such as Dupixent, Fasenra, or [name].  Plan  Patient Instructions  Continue on Breo 200 1 puff daily, rinse after use Continue on Spiriva 2 puffs daily, rinse after use Take Xyzal daily Continue on Flonase 2 puffs daily Continue on Singulair daily Follow-up with Tara Campbell or Tara Zuckerman NP in 2-3 months and As needed   Please contact office for sooner follow up if symptoms do not improve or worsen or seek emergency care

## 2018-11-14 NOTE — Patient Instructions (Addendum)
Continue on Breo 200 1 puff daily, rinse after use Continue on Spiriva 2 puffs daily, rinse after use Take Xyzal daily Continue on Flonase 2 puffs daily Continue on Singulair daily Follow-up with Dr. Kendrick Fries or Kashae Carstens NP in 2-3 months and As needed   Please contact office for sooner follow up if symptoms do not improve or worsen or seek emergency care

## 2018-11-14 NOTE — Progress Notes (Signed)
_0  ID: [name], female    DOB: 07/03/1946, 72 y.o.   MRN: [id number]  Chief Complaint  Patient presents with  . Follow-up    Asthma     Referring provider: Ottis Stain*  HPI: 72 year old female never smoker seen for pulmonary consult January 2019 for asthma Medical history  significant for diabetes, hypertension  TEST/EVENTS :  PFT: 01/2018 PFT: Ratio 69%, FEV1 1.24L 57% pred,total lung capacity 4.04 L 80% predicted, DLCO 20.06 mL 82% predicted  12/2017 CBC: absolute eos count 1000 cell/dL 12/2017 IgE 714  11/14/2018 Follow up: Asthma , Abnormal CT chest  Patient presents for a one-month follow-up.  Last visit patient was having increased asthmatic symptoms with  cough wheezing and shortness of breath.  Exhaled nitric oxide testing was elevated at 92ppb .  She was recommend to increase Breo to 200.  She was given a prednisone taper.  And restarted on Spiriva.  She has chronic rhinitis.  She is on Xyzal Flonase and Singulair. Since last visit patient says she is much improved.  Cough and wheezing have decreased.  She is recently been on a vacation to [place].  Said that she was able to do all her activities.  Had increased activity tolerance.  Has had no albuterol use. She says since returning from [place] she has had some increased nasal congestion drainage and stuffy nose.  Feels that she caught a little cold.  She has no associated wheezing discolored mucus or fever.  Today spirometry shows improvement with FEV1 at 75%, ratio 74, FVC 76%.  Exhaled nitric oxide testing is improved as well at 43ppb   Patient has a known abnormal CT chest.  CT chest showed mild mediastinal and hilar adenopathy.  It appeared nonspecific.  She has a planned CT chest in 4 to 6 months for follow-up.       Allergies  Allergen Reactions  . Latex   . Sulfa Antibiotics Rash    Immunization History  Administered Date(s) Administered  . Influenza, High Dose Seasonal PF  08/29/2017, 09/07/2018  . Pneumococcal Polysaccharide-23 08/29/2017  . Tdap 05/29/2017  . Zoster Recombinat (Shingrix) 02/28/2018    Past Medical History:  Diagnosis Date  . Allergy   . Asthma   . Colon polyps   . Diabetes mellitus without complication ([place])   . Gout   . Heart attack ([place]) 07/30/2016   PCI, 2 stents  . Hyperlipidemia   . Hypertension   . Internal hemorrhoids   . Left rotator cuff tear   . 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 Otomycosis of right ear 09/27/2017  . Paroxysmal atrial fibrillation ([facility])   . Thyroid disease     Tobacco History: Social History   Tobacco Use  Smoking Status Never Smoker  Smokeless Tobacco Never Used   Counseling given: Not Answered   Outpatient Medications Prior to Visit  Medication Sig Dispense Refill  . albuterol (PROVENTIL HFA;VENTOLIN HFA) 108 (90 Base) MCG/ACT inhaler Inhale 2 puffs into the lungs every 6 (six) hours as needed for wheezing. 2 Inhaler 1  . allopurinol (ZYLOPRIM) 300 MG tablet Take 1 tablet (300 mg total) by mouth 2 (two) times daily. 180 tablet 3  . apixaban (ELIQUIS) 5 MG TABS tablet Take 1 tablet (5 mg total) by mouth 2 (two) times daily. 60 tablet 2  . atenolol (TENORMIN) 100 MG tablet Take 1 tablet (100 mg total) by mouth daily. 90 tablet 1  . atorvastatin (LIPITOR) 80 MG tablet Take 1 tablet (  80 mg total) by mouth at bedtime. 90 tablet 1  . blood glucose meter kit and supplies KIT Check morning fasting blood glucose daily and up to 4 times daily as directed 1 each 0  . Cholecalciferol (VITAMIN D3 PO) Take 5,000 mg by mouth daily.    . clopidogrel (PLAVIX) 75 MG tablet TAKE 1 TABLET BY MOUTH  DAILY 90 tablet 1  . COLCRYS 0.6 MG tablet Take 1 tablet (0.6 mg total) by mouth daily. 30 tablet 3  . Cyanocobalamin (B-12 PO) Take 1,000 mg by mouth daily.    . Dulaglutide (TRULICITY) 3.66 QH/4.7ML SOPN Inject 0.5 mLs into the skin once a week. 4 pen 11  . empagliflozin (JARDIANCE) 25 MG TABS tablet Take 25 mg by  mouth daily. 90 tablet 1  . fluticasone (FLONASE) 50 MCG/ACT nasal spray Place 1 spray into both nostrils daily. 16 g 3  . fluticasone furoate-vilanterol (BREO ELLIPTA) 200-25 MCG/INH AEPB Inhale 1 puff into the lungs daily. 1 each 5  . ipratropium-albuterol (DUONEB) 0.5-2.5 (3) MG/3ML SOLN Take 3 mLs by nebulization every 4 (four) hours as needed. 3 mL 0  . irbesartan-hydrochlorothiazide (AVALIDE) 300-12.5 MG tablet Take 1 tablet by mouth daily. 90 tablet 1  . levocetirizine (XYZAL ALLERGY 24HR) 5 MG tablet Take 1 tablet (5 mg total) by mouth every evening. 90 tablet 3  . levothyroxine (SYNTHROID, LEVOTHROID) 112 MCG tablet Take 1 tablet (112 mcg total) by mouth daily before breakfast. 90 tablet 1  . metFORMIN (GLUCOPHAGE) 1000 MG tablet Take 1 tablet (1,000 mg total) by mouth 2 (two) times daily with a meal. 180 tablet 3  . montelukast (SINGULAIR) 10 MG tablet Take 1 tablet (10 mg total) by mouth at bedtime. 90 tablet 3  . NEBULIZER SYSTEM ALL-IN-ONE MISC 1 Device by Does not apply route every 4 (four) hours as needed. 1 each 0  . nitroGLYCERIN (NITROSTAT) 0.4 MG SL tablet Place 1 tablet (0.4 mg total) under the tongue every 5 (five) minutes as needed for chest pain (or tightness). 30 tablet 0  . Omega-3 Fatty Acids (FISH OIL PO) Take 5,000 Units by mouth daily.    . Tiotropium Bromide Monohydrate (SPIRIVA RESPIMAT) 2.5 MCG/ACT AERS Inhale 2 puffs into the lungs daily. 1 Inhaler 5  . fluticasone furoate-vilanterol (BREO ELLIPTA) 200-25 MCG/INH AEPB Inhale 1 puff into the lungs daily. 1 each 0  . Tiotropium Bromide Monohydrate (SPIRIVA RESPIMAT) 2.5 MCG/ACT AERS Inhale 2 puffs into the lungs daily. 1 Inhaler 0  . predniSONE (DELTASONE) 10 MG tablet 4 tabs for 2 days, then 3 tabs for 2 days, 2 tabs for 2 days, then 1 tab for 2 days, then stop (Patient not taking: Reported on 11/14/2018) 20 tablet 1   No facility-administered medications prior to visit.      Review of Systems  Constitutional:    No  weight loss, night sweats,  Fevers, chills, fatigue, or  lassitude.  HEENT:   No headaches,  Difficulty swallowing,  Tooth/dental problems, or  Sore throat,                No sneezing, itching, ear ache,  +nasal congestion, post nasal drip,   CV:  No chest pain,  Orthopnea, PND, swelling in lower extremities, anasarca, dizziness, palpitations, syncope.   GI  No heartburn, indigestion, abdominal pain, nausea, vomiting, diarrhea, change in bowel habits, loss of appetite, bloody stools.   Resp:    No chest wall deformity  Skin: no rash or lesions.  GU:  no dysuria, change in color of urine, no urgency or frequency.  No flank pain, no hematuria   MS:  No joint pain or swelling.  No decreased range of motion.  No back pain.    Physical Exam  BP 116/64 (BP Location: Left Arm, Cuff Size: Normal)   Pulse 91   Temp (!) 97.1 F (36.2 C) (Oral)   Ht 5' 2.5" (1.588 m)   Wt 193 lb (87.5 kg)   SpO2 95%   BMI 34.74 kg/m   GEN: A/Ox3; pleasant , NAD, elderly    HEENT:  [place]/AT,  EACs-clear, TMs-wnl, NOSE-clear drainage , THROAT-clear, no lesions, no postnasal drip or exudate noted.   NECK:  Supple w/ fair ROM; no JVD; normal carotid impulses w/o bruits; no thyromegaly or nodules palpated; no lymphadenopathy.    RESP  Clear  P & A; w/o, wheezes/ rales/ or rhonchi. no accessory muscle use, no dullness to percussion  CARD:  RRR, no m/r/g, no peripheral edema, pulses intact, no cyanosis or clubbing.  GI:   Soft & nt; nml bowel sounds; no organomegaly or masses detected.   Musco: Warm bil, no deformities or joint swelling noted.   Neuro: alert, no focal deficits noted.    Skin: Warm, no lesions or rashes    Lab Results:  CBC    Component Value Date/Time   WBC 7.7 08/05/2018 1022   RBC 4.35 08/05/2018 1022   HGB 12.8 08/05/2018 1022   HGB 12.8 12/26/2017 1630   HCT 37.2 08/05/2018 1022   HCT 37.3 12/26/2017 1630   PLT 330 08/05/2018 1022   MCV 85.5 08/05/2018 1022   MCV 86  12/26/2017 1630   MCH 29.4 08/05/2018 1022   MCHC 34.4 08/05/2018 1022   RDW 13.2 08/05/2018 1022   RDW 13.5 12/26/2017 1630   LYMPHSABS 1,964 08/05/2018 1022   LYMPHSABS 1.8 12/26/2017 1630   MONOABS 1.0 03/07/2018 2159   EOSABS 809 (H) 08/05/2018 1022   EOSABS 1.0 (H) 12/26/2017 1630   BASOSABS 62 08/05/2018 1022   BASOSABS 0.1 12/26/2017 1630    BMET    Component Value Date/Time   NA 139 08/05/2018 1022   K 4.1 08/05/2018 1022   CL 105 08/05/2018 1022   CO2 24 08/05/2018 1022   GLUCOSE 93 08/05/2018 1022   BUN 15 08/05/2018 1022   CREATININE 1.04 (H) 08/05/2018 1022   CALCIUM 10.3 08/05/2018 1022   GFRNONAA 53 (L) 08/05/2018 1022   GFRAA 61 08/05/2018 1022    BNP No results found for: BNP  ProBNP No results found for: PROBNP  Imaging:     PFT Results Latest Ref Rng & Units 01/30/2018  FVC-Pre L 1.74  FVC-Predicted Pre % 60  FVC-Post L 1.80  FVC-Predicted Post % 63  Pre FEV1/FVC % % 67  Post FEV1/FCV % % 69  FEV1-Pre L 1.17  FEV1-Predicted Pre % 54  FEV1-Post L 1.24  DLCO UNC% % 82  DLCO COR %Predicted % 118  TLC L 4.04  TLC % Predicted % 80  RV % Predicted % 87    Lab Results  Component Value Date   NITRICOXIDE 49 11/14/2018        Assessment & Plan:   Moderate persistent asthma Recent exacerbation now improved after steroids and adjustment of maintenance medication regimen.  Patient clearly has an allergic component with increased eosinophils on CBC smear, elevated nitric oxide testing during acute flare.  Simple spirometry did show improvement in FEV1 today which is consistent  with an asthmatic component. Patient is clinically improved.  Continue on current regimen.  If continues to have flares requiring steroids. Need to consider possible Biologics such as Dupixent, Fasenra, or [name].  Plan  Patient Instructions  Continue on Breo 200 1 puff daily, rinse after use Continue on Spiriva 2 puffs daily, rinse after use Take Xyzal  daily Continue on Flonase 2 puffs daily Continue on Singulair daily Follow-up with Dr. [place] Bells or Parrett NP in 2-3 months and As needed   Please contact office for sooner follow up if symptoms do not improve or worsen or seek emergency care       Mediastinal adenopathy Mild mediastinal and hilar adenopathy noted on CT scan during acute illness.  Will repeat CT scan in 4 months to follow-up.  Non-seasonal allergic rhinitis Continue on current regimen     Rexene Edison, NP 11/14/2018

## 2018-11-16 NOTE — Progress Notes (Signed)
Reviewed, agree 

## 2018-11-19 ENCOUNTER — Encounter: Payer: Self-pay | Admitting: Physician Assistant

## 2018-11-19 ENCOUNTER — Ambulatory Visit (INDEPENDENT_AMBULATORY_CARE_PROVIDER_SITE_OTHER): Payer: Medicare HMO | Admitting: Physician Assistant

## 2018-11-19 VITALS — BP 139/71 | HR 70 | Temp 97.5°F | Wt 191.0 lb

## 2018-11-19 DIAGNOSIS — J019 Acute sinusitis, unspecified: Secondary | ICD-10-CM | POA: Diagnosis not present

## 2018-11-19 MED ORDER — IPRATROPIUM BROMIDE 0.06 % NA SOLN: 2.0000 | Freq: Four times a day (QID) | NASAL | 0 refills | Status: DC | PRN

## 2018-11-19 MED ORDER — AMOXICILLIN-POT CLAVULANATE 875-125 MG PO TABS: 1.0000 | ORAL_TABLET | Freq: Two times a day (BID) | ORAL | 0 refills | Status: AC

## 2018-11-19 NOTE — Progress Notes (Signed)
HPI:                                                                Tara Campbell is a 72 y.o. female who presents to [facility] today for ?sinusitis  Sinus Problem  This is a new problem. The current episode started in the past 7 days. The problem is unchanged. There has been no fever. She is experiencing no pain. Associated symptoms include congestion, coughing and sinus pressure. Pertinent negatives include no shortness of breath. (+ PND) Past treatments include saline sprays. The treatment provided no relief.   Recent asthma exacerbation about 1 month ago Denies wheezing or productive cough. She does not feel like current symptoms are an asthma exacerbation    Past Medical History:  Diagnosis Date  . Allergy   . Asthma   . Colon polyps   . Diabetes mellitus without complication ([place])   . Gout   . Heart attack ([place] [place]) 07/30/2016   PCI, 2 stents  . Hyperlipidemia   . Hypertension   . Internal hemorrhoids   . Left rotator cuff tear   . 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 Otomycosis of right ear 09/27/2017  . Paroxysmal atrial fibrillation ([facility])   . Thyroid disease    Past Surgical History:  Procedure Laterality Date  . ANGIOPLASTY    . COLONOSCOPY W/ POLYPECTOMY    . HEMORRHOID BANDING     Social History   Tobacco Use  . Smoking status: Never Smoker  . Smokeless tobacco: Never Used  Substance Use Topics  . Alcohol use: Yes    Comment: Rare   family history includes Diabetes in her father and paternal grandmother; Glaucoma in her father; Heart disease in her father; Hyperlipidemia in her father and mother; Hypertension in her father and mother; Skin cancer in her father.    ROS: negative except as noted in the HPI  Medications: Current Outpatient Medications  Medication Sig Dispense Refill  . albuterol (PROVENTIL HFA;VENTOLIN HFA) 108 (90 Base) MCG/ACT inhaler Inhale 2 puffs into the lungs every 6 (six) hours as  needed for wheezing. 2 Inhaler 1  . allopurinol (ZYLOPRIM) 300 MG tablet Take 1 tablet (300 mg total) by mouth 2 (two) times daily. 180 tablet 3  . apixaban (ELIQUIS) 5 MG TABS tablet Take 1 tablet (5 mg total) by mouth 2 (two) times daily. 60 tablet 2  . atenolol (TENORMIN) 100 MG tablet Take 1 tablet (100 mg total) by mouth daily. 90 tablet 1  . atorvastatin (LIPITOR) 80 MG tablet Take 1 tablet (80 mg total) by mouth at bedtime. 90 tablet 1  . blood glucose meter kit and supplies KIT Check morning fasting blood glucose daily and up to 4 times daily as directed 1 each 0  . Cholecalciferol (VITAMIN D3 PO) Take 5,000 mg by mouth daily.    . clopidogrel (PLAVIX) 75 MG tablet TAKE 1 TABLET BY MOUTH  DAILY 90 tablet 1  . COLCRYS 0.6 MG tablet Take 1 tablet (0.6 mg total) by mouth daily. 30 tablet 3  . Cyanocobalamin (B-12 PO) Take 1,000 mg by mouth daily.    . Dulaglutide (TRULICITY) 6.16 W[place]/3.7TG SOPN Inject 0.5 mLs into the skin once a week. 4 pen 11  .  empagliflozin (JARDIANCE) 25 MG TABS tablet Take 25 mg by mouth daily. 90 tablet 1  . fluticasone (FLONASE) 50 MCG/ACT nasal spray Place 1 spray into both nostrils daily. 16 g 3  . fluticasone furoate-vilanterol (BREO ELLIPTA) 200-25 MCG/INH AEPB Inhale 1 puff into the lungs daily. 1 each 5  . ipratropium-albuterol (DUONEB) 0.5-2.5 (3) MG/3ML SOLN Take 3 mLs by nebulization every 4 (four) hours as needed. 3 mL 0  . irbesartan-hydrochlorothiazide (AVALIDE) 300-12.5 MG tablet Take 1 tablet by mouth daily. 90 tablet 1  . levocetirizine (XYZAL ALLERGY 24HR) 5 MG tablet Take 1 tablet (5 mg total) by mouth every evening. 90 tablet 3  . levothyroxine (SYNTHROID, LEVOTHROID) 112 MCG tablet Take 1 tablet (112 mcg total) by mouth daily before breakfast. 90 tablet 1  . metFORMIN (GLUCOPHAGE) 1000 MG tablet Take 1 tablet (1,000 mg total) by mouth 2 (two) times daily with a meal. 180 tablet 3  . montelukast (SINGULAIR) 10 MG tablet Take 1 tablet (10 mg total) by  mouth at bedtime. 90 tablet 3  . NEBULIZER SYSTEM ALL-IN-ONE MISC 1 Device by Does not apply route every 4 (four) hours as needed. 1 each 0  . nitroGLYCERIN (NITROSTAT) 0.4 MG SL tablet Place 1 tablet (0.4 mg total) under the tongue every 5 (five) minutes as needed for chest pain (or tightness). 30 tablet 0  . Omega-3 Fatty Acids (FISH OIL PO) Take 5,000 Units by mouth daily.    . Tiotropium Bromide Monohydrate (SPIRIVA RESPIMAT) 2.5 MCG/ACT AERS Inhale 2 puffs into the lungs daily. 1 Inhaler 5   No current facility-administered medications for this visit.    Allergies  Allergen Reactions  . Latex   . Sulfa Antibiotics Rash       Objective:  BP 139/71   Pulse 70   Temp (!) 97.5 F (36.4 C) (Oral)   Wt 191 lb (86.6 kg)   SpO2 95%   BMI 34.38 kg/m  Gen:  alert, ill-appearing, no distress, appropriate for age 35: head normocephalic without obvious abnormality, conjunctiva and cornea clear, wearing glasses, nasal mucosa edematous, purulent rhinorrhea, no frontal or maxillary sinus tenderness, oropharynx clear, uvula midline, neck supple, no cervical adenopathy, trachea midline Pulm: Normal work of breathing, normal phonation, clear to auscultation bilaterally, no wheezes, rales or rhonchi CV: Normal rate, regular rhythm, s1 and s2 distinct, no murmurs, clicks or rubs  Neuro: alert and oriented x 3, no tremor MSK: extremities atraumatic, normal gait and station Skin: intact, no rashes on exposed skin, no jaundice, no cyanosis    No results found for this or any previous visit (from the past 72 hour(s)). No results found.    Assessment and Plan: 72 y.o. female with   .Tara Campbell was seen today for cough.  Diagnoses and all orders for this visit:  Acute non-recurrent sinusitis, unspecified location -     amoxicillin-clavulanate (AUGMENTIN) 875-125 MG tablet; Take 1 tablet by mouth 2 (two) times daily for 7 days. -     ipratropium (ATROVENT) 0.06 % nasal spray; Place 2 sprays  into both nostrils 4 (four) times daily as needed for rhinitis.   Afebrile, no tachypnea, no tachycardia, pulse ox 95% on room air at rest, no adventitious lung sounds Treating empirically for bacterial sinusitis with Augmentin Counseled on supportive care and nasal toileting   Peak flow 462 on pulmonary function testing from 01/30/2018.  Provided her with a peak flow meter today.  Instructed her to start her prescription for prednisone if peak flow drops  into the yellow zone.  Patient education and anticipatory guidance given Patient agrees with treatment plan Follow-up as needed if symptoms worsen or fail to improve  Darlyne Russian PA-C

## 2018-11-19 NOTE — Patient Instructions (Signed)
For nasal symptoms/sinusitis: - nasal saline rinses / netti pot several times per day (do this prior to nasal spray) - prescription Atrovent nasal spray: 2 sprays each nostril, up to 4 times per day as needed - you can use OTC nasal decongestant sprays like Afrin (oxymetazoline) BUT do not use for more than 3 days as it will cause worsening congestion/nasal symptoms) - warm facial compresses - oral decongestants and antihistamines like Claritin-D and Zyrtec-D may help dry up secretions (caution using decongestants if you have high blood pressure, heart disease or kidney disease) - for sinus headache: Tylenol 1000mg  every 8 hours as needed   - Okay to use a cough suppressant at bedtime in order to rest (Nyquil, Delsym, Robitussin, etc.)  Note: follow package instructions for all over-the-counter medications. If using multi-symptom medications (Dayquil, Theraflu, etc.), check the label for duplicate drug ingredients.   Sinusitis, Adult Sinusitis is inflammation of your sinuses. Sinuses are hollow spaces in the bones around your face. Your sinuses are located:  Around your eyes.  In the middle of your forehead.  Behind your nose.  In your cheekbones. Mucus normally drains out of your sinuses. When your nasal tissues become inflamed or swollen, mucus can become trapped or blocked. This allows bacteria, viruses, and fungi to grow, which leads to infection. Most infections of the sinuses are caused by a virus. Sinusitis can develop quickly. It can last for up to 4 weeks (acute) or for more than 12 weeks (chronic). Sinusitis often develops after a cold. What are the causes? This condition is caused by anything that creates swelling in the sinuses or stops mucus from draining. This includes:  Allergies.  Asthma.  Infection from bacteria or viruses.  Deformities or blockages in your nose or sinuses.  Abnormal growths in the nose (nasal polyps).  Pollutants, such as chemicals or  irritants in the air.  Infection from fungi (rare). What increases the risk? You are more likely to develop this condition if you:  Have a weak body defense system (immune system).  Do a lot of swimming or diving.  Overuse nasal sprays.  Smoke. What are the signs or symptoms? The main symptoms of this condition are pain and a feeling of pressure around the affected sinuses. Other symptoms include:  Stuffy nose or congestion.  Thick drainage from your nose.  Swelling and warmth over the affected sinuses.  Headache.  Upper toothache.  A cough that may get worse at night.  Extra mucus that collects in the throat or the back of the nose (postnasal drip).  Decreased sense of smell and taste.  Fatigue.  A fever.  Sore throat.  Bad breath. How is this diagnosed? This condition is diagnosed based on:  Your symptoms.  Your medical history.  A physical exam.  Tests to find out if your condition is acute or chronic. This may include: ? Checking your nose for nasal polyps. ? Viewing your sinuses using a device that has a light (endoscope). ? Testing for allergies or bacteria. ? Imaging tests, such as an MRI or CT scan. In rare cases, a bone biopsy may be done to rule out more serious types of fungal sinus disease. How is this treated? Treatment for sinusitis depends on the cause and whether your condition is chronic or acute.  If caused by a virus, your symptoms should go away on their own within 10 days. You may be given medicines to relieve symptoms. They include: ? Medicines that shrink swollen nasal passages (  topical intranasal decongestants). ? Medicines that treat allergies (antihistamines). ? A spray that eases inflammation of the nostrils (topical intranasal corticosteroids). ? Rinses that help get rid of thick mucus in your nose (nasal saline washes).  If caused by bacteria, your health care provider may recommend waiting to see if your symptoms improve.  Most bacterial infections will get better without antibiotic medicine. You may be given antibiotics if you have: ? A severe infection. ? A weak immune system.  If caused by narrow nasal passages or nasal polyps, you may need to have surgery. Follow these instructions at home: Medicines  Take, use, or apply over-the-counter and prescription medicines only as told by your health care provider. These may include nasal sprays.  If you were prescribed an antibiotic medicine, take it as told by your health care provider. Do not stop taking the antibiotic even if you start to feel better. Hydrate and humidify   Drink enough fluid to keep your urine pale yellow. Staying hydrated will help to thin your mucus.  Use a cool mist humidifier to keep the humidity level in your home above 50%.  Inhale steam for 10-15 minutes, 3-4 times a day, or as told by your health care provider. You can do this in the bathroom while a hot shower is running.  Limit your exposure to cool or dry air. Rest  Rest as much as possible.  Sleep with your head raised (elevated).  Make sure you get enough sleep each night. General instructions   Apply a warm, moist washcloth to your face 3-4 times a day or as told by your health care provider. This will help with discomfort.  Wash your hands often with soap and water to reduce your exposure to germs. If soap and water are not available, use hand sanitizer.  Do not smoke. Avoid being around people who are smoking (secondhand smoke).  Keep all follow-up visits as told by your health care provider. This is important. Contact a health care provider if:  You have a fever.  Your symptoms get worse.  Your symptoms do not improve within 10 days. Get help right away if:  You have a severe headache.  You have persistent vomiting.  You have severe pain or swelling around your face or eyes.  You have vision problems.  You develop confusion.  Your neck is  stiff.  You have trouble breathing. Summary  Sinusitis is soreness and inflammation of your sinuses. Sinuses are hollow spaces in the bones around your face.  This condition is caused by nasal tissues that become inflamed or swollen. The swelling traps or blocks the flow of mucus. This allows bacteria, viruses, and fungi to grow, which leads to infection.  If you were prescribed an antibiotic medicine, take it as told by your health care provider. Do not stop taking the antibiotic even if you start to feel better.  Keep all follow-up visits as told by your health care provider. This is important. This information is not intended to replace advice given to you by your health care provider. Make sure you discuss any questions you have with your health care provider. Document Released: 11/19/2005 Document Revised: 04/21/2018 Document Reviewed: 04/21/2018 Elsevier Interactive Patient Education  2019 [employer].

## 2018-11-28 ENCOUNTER — Encounter: Payer: Self-pay | Admitting: Physician Assistant

## 2018-11-28 DIAGNOSIS — J019 Acute sinusitis, unspecified: Secondary | ICD-10-CM

## 2018-11-28 MED ORDER — DOXYCYCLINE HYCLATE 100 MG PO TABS: 100.0000 mg | ORAL_TABLET | Freq: Two times a day (BID) | ORAL | 0 refills | Status: DC

## 2018-12-09 ENCOUNTER — Encounter: Payer: Self-pay | Admitting: Physician Assistant

## 2018-12-09 ENCOUNTER — Ambulatory Visit (INDEPENDENT_AMBULATORY_CARE_PROVIDER_SITE_OTHER): Payer: Medicare Other | Admitting: Physician Assistant

## 2018-12-09 ENCOUNTER — Ambulatory Visit (INDEPENDENT_AMBULATORY_CARE_PROVIDER_SITE_OTHER): Payer: Medicare Other

## 2018-12-09 VITALS — BP 166/84 | HR 93 | Temp 97.8°F | Wt 189.0 lb

## 2018-12-09 DIAGNOSIS — J329 Chronic sinusitis, unspecified: Secondary | ICD-10-CM | POA: Diagnosis not present

## 2018-12-09 DIAGNOSIS — J019 Acute sinusitis, unspecified: Secondary | ICD-10-CM | POA: Diagnosis not present

## 2018-12-09 DIAGNOSIS — J0140 Acute pansinusitis, unspecified: Secondary | ICD-10-CM

## 2018-12-09 NOTE — Progress Notes (Signed)
HPI:                                                                Tara Campbell is a 73 y.o. female who presents to [facility]: [place] today for ?sinusitis  Persistent nasal congestion x 1 month. She has had 2 rounds of antibiotics without any relief during or following antibiotic treatment.  She is using Flonase and Atrovent nasal sprays without relief. She feels like the medication does not enter her nasal passages. Denies fever, malaise, purulent nasal drainage, sinus pain/pressure, headaches, dental pain.  Past Medical History:  Diagnosis Date  . Allergy   . Asthma   . Colon polyps   . Diabetes mellitus without complication ([place])   . Gout   . Heart attack ([place]) 07/30/2016   PCI, 2 stents  . Hyperlipidemia   . Hypertension   . Internal hemorrhoids   . Left rotator cuff tear   . 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 Otomycosis of right ear 09/27/2017  . Paroxysmal atrial fibrillation (HCC)   . Thyroid disease    Past Surgical History:  Procedure Laterality Date  . ANGIOPLASTY    . COLONOSCOPY W/ POLYPECTOMY    . HEMORRHOID BANDING     Social History   Tobacco Use  . Smoking status: Never Smoker  . Smokeless tobacco: Never Used  Substance Use Topics  . Alcohol use: Yes    Comment: Rare   family history includes Diabetes in her father and paternal grandmother; Glaucoma in her father; Heart disease in her father; Hyperlipidemia in her father and mother; Hypertension in her father and mother; Skin cancer in her father.    ROS: negative except as noted in the HPI  Medications: Current Outpatient Medications  Medication Sig Dispense Refill  . albuterol (PROVENTIL HFA;VENTOLIN HFA) 108 (90 Base) MCG/ACT inhaler Inhale 2 puffs into the lungs every 6 (six) hours as needed for wheezing. 2 Inhaler 1  . allopurinol (ZYLOPRIM) 300 MG tablet Take 1 tablet (300 mg total) by mouth 2 (two) times daily. 180 tablet 3  . apixaban  (ELIQUIS) 5 MG TABS tablet Take 1 tablet (5 mg total) by mouth 2 (two) times daily. 60 tablet 2  . atenolol (TENORMIN) 100 MG tablet Take 1 tablet (100 mg total) by mouth daily. 90 tablet 1  . atorvastatin (LIPITOR) 80 MG tablet Take 1 tablet (80 mg total) by mouth at bedtime. 90 tablet 1  . blood glucose meter kit and supplies KIT Check morning fasting blood glucose daily and up to 4 times daily as directed 1 each 0  . Cholecalciferol (VITAMIN D3 PO) Take 5,000 mg by mouth daily.    . clopidogrel (PLAVIX) 75 MG tablet TAKE 1 TABLET BY MOUTH  DAILY 90 tablet 1  . COLCRYS 0.6 MG tablet Take 1 tablet (0.6 mg total) by mouth daily. 30 tablet 3  . Cyanocobalamin (B-12 PO) Take 1,000 mg by mouth daily.    . Dulaglutide (TRULICITY) 6.65 LD/3.5TS SOPN Inject 0.5 mLs into the skin once a week. 4 pen 11  . empagliflozin (JARDIANCE) 25 MG TABS tablet Take 25 mg by mouth daily. 90 tablet 1  . fluticasone (FLONASE) 50 MCG/ACT nasal spray Place 1 spray into both nostrils  daily. 16 g 3  . fluticasone furoate-vilanterol (BREO ELLIPTA) 200-25 MCG/INH AEPB Inhale 1 puff into the lungs daily. 1 each 5  . ipratropium (ATROVENT) 0.06 % nasal spray Place 2 sprays into both nostrils 4 (four) times daily as needed for rhinitis. 15 mL 0  . ipratropium-albuterol (DUONEB) 0.5-2.5 (3) MG/3ML SOLN Take 3 mLs by nebulization every 4 (four) hours as needed. 3 mL 0  . irbesartan-hydrochlorothiazide (AVALIDE) 300-12.5 MG tablet Take 1 tablet by mouth daily. 90 tablet 1  . levocetirizine (XYZAL ALLERGY 24HR) 5 MG tablet Take 1 tablet (5 mg total) by mouth every evening. 90 tablet 3  . levothyroxine (SYNTHROID, LEVOTHROID) 112 MCG tablet Take 1 tablet (112 mcg total) by mouth daily before breakfast. 90 tablet 1  . metFORMIN (GLUCOPHAGE) 1000 MG tablet Take 1 tablet (1,000 mg total) by mouth 2 (two) times daily with a meal. 180 tablet 3  . montelukast (SINGULAIR) 10 MG tablet Take 1 tablet (10 mg total) by mouth at bedtime. 90  tablet 3  . NEBULIZER SYSTEM ALL-IN-ONE MISC 1 Device by Does not apply route every 4 (four) hours as needed. 1 each 0  . nitroGLYCERIN (NITROSTAT) 0.4 MG SL tablet Place 1 tablet (0.4 mg total) under the tongue every 5 (five) minutes as needed for chest pain (or tightness). 30 tablet 0  . Omega-3 Fatty Acids (FISH OIL PO) Take 5,000 Units by mouth daily.    . Tiotropium Bromide Monohydrate (SPIRIVA RESPIMAT) 2.5 MCG/ACT AERS Inhale 2 puffs into the lungs daily. 1 Inhaler 5   No current facility-administered medications for this visit.    Allergies  Allergen Reactions  . Latex   . Sulfa Antibiotics Rash       Objective:  BP (!) 166/84   Pulse 93   Temp 97.8 F (36.6 C) (Oral)   Wt 189 lb (85.7 kg)   SpO2 97%   BMI 34.02 kg/m  Gen:  alert, not ill-appearing, no distress, appropriate for age 74: head normocephalic without obvious abnormality, conjunctiva and cornea clear, wearing glasses, nasal mucosa edematous worse on the right, nares are patent, oropharynx clear, cobblestoning of posterior third of the tongue, neck supple, trachea midline Pulm: Normal work of breathing, normal phonation, clear to auscultation bilaterally, no wheezes, rales or rhonchi CV: Normal rate, regular rhythm, s1 and s2 distinct, no murmurs, clicks or rubs  Neuro: alert and oriented x 3, no tremor MSK: extremities atraumatic, normal gait and station Skin: intact, no rashes on exposed skin, no jaundice, no cyanosis    No results found for this or any previous visit (from the past 72 hour(s)). No results found.    Assessment and Plan: 73 y.o. female with   .Tara Campbell was seen today for sinusitis.  Diagnoses and all orders for this visit:  Subacute sinusitis, unspecified location -     Cancel: CT Maxillofacial WO CM; Future -     CT MAXILLOFACIAL LIMITED WO CONTRAST; Future   Failure of 2 rounds of empiric antibiotic therapy CT sinus pending    Patient education and anticipatory guidance  given Patient agrees with treatment plan Follow-up pending results of CT scan or sooner as needed if symptoms worsen or fail to improve  Darlyne Russian PA-C

## 2018-12-10 MED ORDER — CLINDAMYCIN HCL 300 MG PO CAPS: 300.0000 mg | ORAL_CAPSULE | Freq: Three times a day (TID) | ORAL | 0 refills | Status: AC

## 2018-12-16 DIAGNOSIS — J339 Nasal polyp, unspecified: Secondary | ICD-10-CM | POA: Diagnosis not present

## 2018-12-16 DIAGNOSIS — J342 Deviated nasal septum: Secondary | ICD-10-CM | POA: Diagnosis not present

## 2018-12-16 DIAGNOSIS — J329 Chronic sinusitis, unspecified: Secondary | ICD-10-CM | POA: Diagnosis not present

## 2018-12-16 DIAGNOSIS — J309 Allergic rhinitis, unspecified: Secondary | ICD-10-CM | POA: Diagnosis not present

## 2018-12-18 DIAGNOSIS — H527 Unspecified disorder of refraction: Secondary | ICD-10-CM | POA: Diagnosis not present

## 2018-12-18 DIAGNOSIS — H18453 Nodular corneal degeneration, bilateral: Secondary | ICD-10-CM | POA: Diagnosis not present

## 2018-12-18 DIAGNOSIS — H40003 Preglaucoma, unspecified, bilateral: Secondary | ICD-10-CM | POA: Diagnosis not present

## 2018-12-18 DIAGNOSIS — H02831 Dermatochalasis of right upper eyelid: Secondary | ICD-10-CM | POA: Diagnosis not present

## 2018-12-18 DIAGNOSIS — H02834 Dermatochalasis of left upper eyelid: Secondary | ICD-10-CM | POA: Diagnosis not present

## 2018-12-18 DIAGNOSIS — E119 Type 2 diabetes mellitus without complications: Secondary | ICD-10-CM | POA: Diagnosis not present

## 2018-12-18 LAB — HM DIABETES EYE EXAM

## 2018-12-25 ENCOUNTER — Encounter: Payer: Self-pay | Admitting: Physician Assistant

## 2019-01-21 DIAGNOSIS — J342 Deviated nasal septum: Secondary | ICD-10-CM | POA: Diagnosis not present

## 2019-01-21 DIAGNOSIS — J339 Nasal polyp, unspecified: Secondary | ICD-10-CM | POA: Diagnosis not present

## 2019-01-21 DIAGNOSIS — J329 Chronic sinusitis, unspecified: Secondary | ICD-10-CM | POA: Diagnosis not present

## 2019-01-21 DIAGNOSIS — J309 Allergic rhinitis, unspecified: Secondary | ICD-10-CM | POA: Diagnosis not present

## 2019-01-28 ENCOUNTER — Ambulatory Visit: Payer: Medicare Other | Admitting: Pulmonary Disease

## 2019-01-28 ENCOUNTER — Encounter: Payer: Self-pay | Admitting: Pulmonary Disease

## 2019-01-28 ENCOUNTER — Ambulatory Visit: Payer: Medicare HMO | Admitting: Pulmonary Disease

## 2019-01-28 VITALS — BP 134/60 | HR 105 | Ht 64.0 in | Wt 190.4 lb

## 2019-01-28 DIAGNOSIS — J3089 Other allergic rhinitis: Secondary | ICD-10-CM | POA: Diagnosis not present

## 2019-01-28 DIAGNOSIS — J4550 Severe persistent asthma, uncomplicated: Secondary | ICD-10-CM | POA: Diagnosis not present

## 2019-01-28 NOTE — Progress Notes (Signed)
Subjective:   PATIENT ID: Tara Campbell GENDER: female DOB: 1946-06-10, MRN: [id number]   Synopsis: Referred in January 2019 for asthma, has elevated serum IgE.  She has allergic rhinitis, nasal polyposis.    HPI  Chief Complaint  Patient presents with  . Follow-up    Asthma-no flare ups since breo and spiriva start   Tara Campbell says that her asthma has been fairly well controlled since the last visit.  She has been struggling with sinus congestion and recently had a CT scan of her sinuses which showed severe pansinusitis and she has a lot of nasal polyps.  She had allergy testing which showed some allergy to cat dog and other insect dander and a serum IgE greater than 1000.  She is about to start taking allergy drops.  She says she wants to try to avoid sinus surgery if at all possible.  She is frustrated with the cost of her Memory [facility] and Spiriva and would like to know if there is another option  Past Medical History:  Diagnosis Date  . Allergy   . Asthma   . Colon polyps   . Diabetes mellitus without complication ([place])   . Gout   . Heart attack ([place]) 07/30/2016   PCI, 2 stents  . Hyperlipidemia   . Hypertension   . Internal hemorrhoids   . Left rotator cuff tear   . 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 Otomycosis of right ear 09/27/2017  . Paroxysmal atrial fibrillation (HCC)   . Thyroid disease         Review of Systems  Constitutional: Negative for chills, fever, malaise/fatigue and weight loss.  HENT: Negative for congestion, nosebleeds, sinus pain and sore throat.   Eyes: Negative for photophobia, pain and discharge.  Respiratory: Negative for cough, hemoptysis, sputum production, shortness of breath and wheezing.   Cardiovascular: Negative for chest pain, palpitations, orthopnea and leg swelling.  Gastrointestinal: Negative for abdominal pain, constipation, diarrhea, nausea and vomiting.  Genitourinary: Negative for dysuria, frequency, hematuria and urgency.    Musculoskeletal: Negative for back pain, joint pain, myalgias and neck pain.  Skin: Negative for itching and rash.  Neurological: Negative for tingling, tremors, sensory change, speech change, focal weakness, seizures, weakness and headaches.  Psychiatric/Behavioral: Negative for memory loss, substance abuse and suicidal ideas. The patient is not nervous/anxious.       Objective:  Physical Exam   Vitals:   01/28/19 1205 01/28/19 1206  BP:  134/60  Pulse:  (!) 105  SpO2:  97%  Weight: 190 lb 6.4 oz (86.4 kg)   Height: _0  (1.626 m)     Gen: well appearing HENT: OP clear, TM's clear, neck supple PULM: Some upper lobe wheezing B, normal percussion CV: RRR, no mgr, trace edema GI: BS+, soft, nontender Derm: no cyanosis or rash Psyche: normal mood and affect     CBC    Component Value Date/Time   WBC 7.7 08/05/2018 1022   RBC 4.35 08/05/2018 1022   HGB 12.8 08/05/2018 1022   HGB 12.8 12/26/2017 1630   HCT 37.2 08/05/2018 1022   HCT 37.3 12/26/2017 1630   PLT 330 08/05/2018 1022   MCV 85.5 08/05/2018 1022   MCV 86 12/26/2017 1630   MCH 29.4 08/05/2018 1022   MCHC 34.4 08/05/2018 1022   RDW 13.2 08/05/2018 1022   RDW 13.5 12/26/2017 1630   LYMPHSABS 1,964 08/05/2018 1022   LYMPHSABS 1.8 12/26/2017 1630   MONOABS 1.0 03/07/2018 2159   EOSABS  809 (H) 08/05/2018 1022   EOSABS 1.0 (H) 12/26/2017 1630   BASOSABS 62 08/05/2018 1022   BASOSABS 0.1 12/26/2017 1630     Chest imaging:  PFT: 01/2018 PFT: Ratio 69%, FEV1 1.24L 57% pred, total lung capacity 4.04 L 80% predicted, DLCO 20.06 mL 82% predicted  Labs: 12/2017 CBC: absolute eos count 1000 cell/dL 12/2017 IgE 714 2020 serum IgE obtained by her allergist showed a count of 1200, allergy testing was positive for cat and dog dander  Path:  Echo:  Heart Catheterization:  Records from her visit with Tara Campbell reviewed where she was noted to be complaining of dyspnea on exertion despite taking Breo and  Spiriva.       Assessment & Plan:   Severe persistent asthma, unspecified whether complicated  Non-seasonal allergic rhinitis, unspecified trigger  Discussion: Tara Campbell has nasal polyposis, severe pansinusitis, and allergic rhinitis and severe persistent asthma.  Fortunately she has not had an exacerbation of her asthma lately.  I completely agree with plans to consider allergy/immunotherapy.  If that is ineffective she should consider Xolair prior to surgery.  Plan: Severe asthma with allergies: I agree with your plans to have immunotherapy (allergy treatment) For now continue Breo and Spiriva You could consider substituting Breo for 1 of the allergic options: Generic Advair or generic Symbicort Keep using albuterol as needed for chest tightness wheezing or shortness of breath Practice good hand hygiene Stay physically active  Nasal polyposis, elevated serum IgE: Continue plans for immunotherapy Xolair would be a treatment option if immunotherapy is ineffective  We will plan on seeing you back in 4 to 6 months or sooner if needed     Current Outpatient Medications:  .  albuterol (PROVENTIL HFA;VENTOLIN HFA) 108 (90 Base) MCG/ACT inhaler, Inhale 2 puffs into the lungs every 6 (six) hours as needed for wheezing., Disp: 2 Inhaler, Rfl: 1 .  allopurinol (ZYLOPRIM) 300 MG tablet, Take 1 tablet (300 mg total) by mouth 2 (two) times daily., Disp: 180 tablet, Rfl: 3 .  apixaban (ELIQUIS) 5 MG TABS tablet, Take 1 tablet (5 mg total) by mouth 2 (two) times daily., Disp: 60 tablet, Rfl: 2 .  atenolol (TENORMIN) 100 MG tablet, Take 1 tablet (100 mg total) by mouth daily., Disp: 90 tablet, Rfl: 1 .  atorvastatin (LIPITOR) 80 MG tablet, Take 1 tablet (80 mg total) by mouth at bedtime., Disp: 90 tablet, Rfl: 1 .  blood glucose meter kit and supplies KIT, Check morning fasting blood glucose daily and up to 4 times daily as directed, Disp: 1 each, Rfl: 0 .  Cholecalciferol (VITAMIN D3 PO), Take  5,000 mg by mouth daily., Disp: , Rfl:  .  clopidogrel (PLAVIX) 75 MG tablet, TAKE 1 TABLET BY MOUTH  DAILY, Disp: 90 tablet, Rfl: 1 .  COLCRYS 0.6 MG tablet, Take 1 tablet (0.6 mg total) by mouth daily., Disp: 30 tablet, Rfl: 3 .  Cyanocobalamin (B-12 PO), Take 1,000 mg by mouth daily., Disp: , Rfl:  .  Dulaglutide (TRULICITY) 7.11 AF/7.9UX SOPN, Inject 0.5 mLs into the skin once a week., Disp: 4 pen, Rfl: 11 .  empagliflozin (JARDIANCE) 25 MG TABS tablet, Take 25 mg by mouth daily., Disp: 90 tablet, Rfl: 1 .  fluticasone (FLONASE) 50 MCG/ACT nasal spray, Place 1 spray into both nostrils daily., Disp: 16 g, Rfl: 3 .  fluticasone furoate-vilanterol (BREO ELLIPTA) 200-25 MCG/INH AEPB, Inhale 1 puff into the lungs daily., Disp: 1 each, Rfl: 5 .  ipratropium (ATROVENT) 0.06 % nasal  spray, Place 2 sprays into both nostrils 4 (four) times daily as needed for rhinitis., Disp: 15 mL, Rfl: 0 .  ipratropium-albuterol (DUONEB) 0.5-2.5 (3) MG/3ML SOLN, Take 3 mLs by nebulization every 4 (four) hours as needed., Disp: 3 mL, Rfl: 0 .  irbesartan-hydrochlorothiazide (AVALIDE) 300-12.5 MG tablet, Take 1 tablet by mouth daily., Disp: 90 tablet, Rfl: 1 .  levocetirizine (XYZAL ALLERGY 24HR) 5 MG tablet, Take 1 tablet (5 mg total) by mouth every evening., Disp: 90 tablet, Rfl: 3 .  levothyroxine (SYNTHROID, LEVOTHROID) 112 MCG tablet, Take 1 tablet (112 mcg total) by mouth daily before breakfast., Disp: 90 tablet, Rfl: 1 .  metFORMIN (GLUCOPHAGE) 1000 MG tablet, Take 1 tablet (1,000 mg total) by mouth 2 (two) times daily with a meal., Disp: 180 tablet, Rfl: 3 .  montelukast (SINGULAIR) 10 MG tablet, Take 1 tablet (10 mg total) by mouth at bedtime., Disp: 90 tablet, Rfl: 3 .  NEBULIZER SYSTEM ALL-IN-ONE MISC, 1 Device by Does not apply route every 4 (four) hours as needed., Disp: 1 each, Rfl: 0 .  nitroGLYCERIN (NITROSTAT) 0.4 MG SL tablet, Place 1 tablet (0.4 mg total) under the tongue every 5 (five) minutes as needed  for chest pain (or tightness)., Disp: 30 tablet, Rfl: 0 .  Omega-3 Fatty Acids (FISH OIL PO), Take 5,000 Units by mouth daily., Disp: , Rfl:  .  Tiotropium Bromide Monohydrate (SPIRIVA RESPIMAT) 2.5 MCG/ACT AERS, Inhale 2 puffs into the lungs daily., Disp: 1 Inhaler, Rfl: 5

## 2019-01-28 NOTE — Patient Instructions (Signed)
Severe asthma with allergies: I agree with your plans to have immunotherapy (allergy treatment) For now continue Breo and Spiriva You could consider substituting Breo for 1 of the allergic options: Generic Advair or generic Symbicort Keep using albuterol as needed for chest tightness wheezing or shortness of breath Practice good hand hygiene Stay physically active  Nasal polyposis, elevated serum IgE: Continue plans for immunotherapy Xolair would be a treatment option if immunotherapy is ineffective  We will plan on seeing you back in 4 to 6 months or sooner if needed

## 2019-01-28 NOTE — Progress Notes (Deleted)
Subjective:   PATIENT ID: Tara Campbell GENDER: female DOB: 02/01/1946, MRN: [id number]   Synopsis: Referred in January 2019 for asthma  HPI  No chief complaint on file.  ***   Past Medical History:  Diagnosis Date  . Allergy   . Asthma   . Colon polyps   . Diabetes mellitus without complication ([place])   . Gout   . Heart attack ([place]) 07/30/2016   PCI, 2 stents  . Hyperlipidemia   . Hypertension   . Internal hemorrhoids   . Left rotator cuff tear   . 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 Otomycosis of right ear 09/27/2017  . Paroxysmal atrial fibrillation (H[facility])   . Thyroid disease         Review of Systems  Constitutional: Negative for chills, fever, malaise/fatigue and weight loss.  HENT: Negative for congestion, nosebleeds, sinus pain and sore throat.   Eyes: Negative for photophobia, pain and discharge.  Respiratory: Negative for cough, hemoptysis, sputum production, shortness of breath and wheezing.   Cardiovascular: Negative for chest pain, palpitations, orthopnea and leg swelling.  Gastrointestinal: Negative for abdominal pain, constipation, diarrhea, nausea and vomiting.  Genitourinary: Negative for dysuria, frequency, hematuria and urgency.  Musculoskeletal: Negative for back pain, joint pain, myalgias and neck pain.  Skin: Negative for itching and rash.  Neurological: Negative for tingling, tremors, sensory change, speech change, focal weakness, seizures, weakness and headaches.  Psychiatric/Behavioral: Negative for memory loss, substance abuse and suicidal ideas. The patient is not nervous/anxious.       Objective:  Physical Exam   There were no vitals filed for this visit.  ***   CBC    Component Value Date/Time   WBC 7.7 08/05/2018 1022   RBC 4.35 08/05/2018 1022   HGB 12.8 08/05/2018 1022   HGB 12.8 12/26/2017 1630   HCT 37.2 08/05/2018 1022   HCT 37.3 12/26/2017 1630   PLT 330 08/05/2018 1022   MCV 85.5 08/05/2018 1022   MCV 86  12/26/2017 1630   MCH 29.4 08/05/2018 1022   MCHC 34.4 08/05/2018 1022   RDW 13.2 08/05/2018 1022   RDW 13.5 12/26/2017 1630   LYMPHSABS 1,964 08/05/2018 1022   LYMPHSABS 1.8 12/26/2017 1630   MONOABS 1.0 03/07/2018 2159   EOSABS 809 (H) 08/05/2018 1022   EOSABS 1.0 (H) 12/26/2017 1630   BASOSABS 62 08/05/2018 1022   BASOSABS 0.1 12/26/2017 1630     Chest imaging:  PFT: 01/2018 PFT: Ratio 69%, FEV1 1.24L 57% pred, total lung capacity 4.04 L 80% predicted, DLCO 20.06 mL 82% predicted  Labs: 12/2017 CBC: absolute eos count 1000 cell/dL 12/2017 IgE 714  Path:  Echo:  Heart Catheterization:  Records from her visit with Nelson Chimes reviewed where she was noted to be complaining of dyspnea on exertion despite taking Breo and Spiriva.       Assessment & Plan:   No diagnosis found.  Discussion: ***     Current Outpatient Medications:  .  albuterol (PROVENTIL HFA;VENTOLIN HFA) 108 (90 Base) MCG/ACT inhaler, Inhale 2 puffs into the lungs every 6 (six) hours as needed for wheezing., Disp: 2 Inhaler, Rfl: 1 .  allopurinol (ZYLOPRIM) 300 MG tablet, Take 1 tablet (300 mg total) by mouth 2 (two) times daily., Disp: 180 tablet, Rfl: 3 .  apixaban (ELIQUIS) 5 MG TABS tablet, Take 1 tablet (5 mg total) by mouth 2 (two) times daily., Disp: 60 tablet, Rfl: 2 .  atenolol (TENORMIN) 100 MG tablet, Take 1 tablet (  100 mg total) by mouth daily., Disp: 90 tablet, Rfl: 1 .  atorvastatin (LIPITOR) 80 MG tablet, Take 1 tablet (80 mg total) by mouth at bedtime., Disp: 90 tablet, Rfl: 1 .  blood glucose meter kit and supplies KIT, Check morning fasting blood glucose daily and up to 4 times daily as directed, Disp: 1 each, Rfl: 0 .  Cholecalciferol (VITAMIN D3 PO), Take 5,000 mg by mouth daily., Disp: , Rfl:  .  clopidogrel (PLAVIX) 75 MG tablet, TAKE 1 TABLET BY MOUTH  DAILY, Disp: 90 tablet, Rfl: 1 .  COLCRYS 0.6 MG tablet, Take 1 tablet (0.6 mg total) by mouth daily., Disp: 30 tablet, Rfl:  3 .  Cyanocobalamin (B-12 PO), Take 1,000 mg by mouth daily., Disp: , Rfl:  .  Dulaglutide (TRULICITY) 1.27 NT/7.0YF SOPN, Inject 0.5 mLs into the skin once a week., Disp: 4 pen, Rfl: 11 .  empagliflozin (JARDIANCE) 25 MG TABS tablet, Take 25 mg by mouth daily., Disp: 90 tablet, Rfl: 1 .  fluticasone (FLONASE) 50 MCG/ACT nasal spray, Place 1 spray into both nostrils daily., Disp: 16 g, Rfl: 3 .  fluticasone furoate-vilanterol (BREO ELLIPTA) 200-25 MCG/INH AEPB, Inhale 1 puff into the lungs daily., Disp: 1 each, Rfl: 5 .  ipratropium (ATROVENT) 0.06 % nasal spray, Place 2 sprays into both nostrils 4 (four) times daily as needed for rhinitis., Disp: 15 mL, Rfl: 0 .  ipratropium-albuterol (DUONEB) 0.5-2.5 (3) MG/3ML SOLN, Take 3 mLs by nebulization every 4 (four) hours as needed., Disp: 3 mL, Rfl: 0 .  irbesartan-hydrochlorothiazide (AVALIDE) 300-12.5 MG tablet, Take 1 tablet by mouth daily., Disp: 90 tablet, Rfl: 1 .  levocetirizine (XYZAL ALLERGY 24HR) 5 MG tablet, Take 1 tablet (5 mg total) by mouth every evening., Disp: 90 tablet, Rfl: 3 .  levothyroxine (SYNTHROID, LEVOTHROID) 112 MCG tablet, Take 1 tablet (112 mcg total) by mouth daily before breakfast., Disp: 90 tablet, Rfl: 1 .  metFORMIN (GLUCOPHAGE) 1000 MG tablet, Take 1 tablet (1,000 mg total) by mouth 2 (two) times daily with a meal., Disp: 180 tablet, Rfl: 3 .  montelukast (SINGULAIR) 10 MG tablet, Take 1 tablet (10 mg total) by mouth at bedtime., Disp: 90 tablet, Rfl: 3 .  NEBULIZER SYSTEM ALL-IN-ONE MISC, 1 Device by Does not apply route every 4 (four) hours as needed., Disp: 1 each, Rfl: 0 .  nitroGLYCERIN (NITROSTAT) 0.4 MG SL tablet, Place 1 tablet (0.4 mg total) under the tongue every 5 (five) minutes as needed for chest pain (or tightness)., Disp: 30 tablet, Rfl: 0 .  Omega-3 Fatty Acids (FISH OIL PO), Take 5,000 Units by mouth daily., Disp: , Rfl:  .  Tiotropium Bromide Monohydrate (SPIRIVA RESPIMAT) 2.5 MCG/ACT AERS, Inhale 2  puffs into the lungs daily., Disp: 1 Inhaler, Rfl: 5

## 2019-02-03 DIAGNOSIS — J310 Chronic rhinitis: Secondary | ICD-10-CM | POA: Diagnosis not present

## 2019-02-06 ENCOUNTER — Encounter: Payer: Self-pay | Admitting: Sports Medicine

## 2019-02-06 ENCOUNTER — Ambulatory Visit (INDEPENDENT_AMBULATORY_CARE_PROVIDER_SITE_OTHER): Payer: Medicare Other | Admitting: Sports Medicine

## 2019-02-06 DIAGNOSIS — J4540 Moderate persistent asthma, uncomplicated: Secondary | ICD-10-CM

## 2019-02-06 MED ORDER — PREDNISONE 50 MG PO TABS: ORAL_TABLET | ORAL | 0 refills | Status: DC

## 2019-02-06 MED ORDER — AZITHROMYCIN 250 MG PO TABS: ORAL_TABLET | ORAL | 0 refills | Status: DC

## 2019-02-06 NOTE — Progress Notes (Signed)
Subjective:    CC: Feeling sick  HPI: This is a pleasant 73 year old female with asthma, and sinusitis.  Currently doing allergy drops.  She is also on Breo and Spiriva.  Symptoms have been going on now for about a week and a half.  Mild wheeze, cough, shortness of breath.  Sinus pain and pressure.  Prednisone has worked for her in the past.  Symptoms are moderate, persistent.  I reviewed the past medical history, family history, social history, surgical history, and allergies today and no changes were needed.  Please see the problem list section below in epic for further details.  Past Medical History: Past Medical History:  Diagnosis Date  . Allergy   . Asthma   . Colon polyps   . Diabetes mellitus without complication (HCC)   . Gout   . Heart attack (HCC) 07/30/2016   PCI, 2 stents  . Hyperlipidemia   . Hypertension   . Internal hemorrhoids   . Left rotator cuff tear   . 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 Otomycosis of right ear 09/27/2017  . Paroxysmal atrial fibrillation (HCC)   . Thyroid disease    Past Surgical History: Past Surgical History:  Procedure Laterality Date  . ANGIOPLASTY    . COLONOSCOPY W/ POLYPECTOMY    . HEMORRHOID BANDING     Social History: Social History   Socioeconomic History  . Marital status: Divorced    Spouse name: Not on file  . Number of children: 2  . Years of education: Not on file  . Highest education level: Not on file  Occupational History  . Not on file  Social Needs  . Financial resource strain: Not on file  . Food insecurity:    Worry: Not on file    Inability: Not on file  . Transportation needs:    Medical: Not on file    Non-medical: Not on file  Tobacco Use  . Smoking status: Never Smoker  . Smokeless tobacco: Never Used  Substance and Sexual Activity  . Alcohol use: Yes    Comment: Rare  . Drug use: No  . Sexual activity: Not Currently  Lifestyle  . Physical activity:    Days per week: Not on file   Minutes per session: Not on file  . Stress: Not on file  Relationships  . Social connections:    Talks on phone: Not on file    Gets together: Not on file    Attends religious service: Not on file    Active member of club or organization: Not on file    Attends meetings of clubs or organizations: Not on file    Relationship status: Not on file  Other Topics Concern  . Not on file  Social History Narrative  . Not on file   Family History: Family History  Problem Relation Age of Onset  . Hypertension Mother   . Hyperlipidemia Mother   . Glaucoma Father   . Heart disease Father   . Hypertension Father   . Diabetes Father   . Hyperlipidemia Father   . Skin cancer Father   . Diabetes Paternal Grandmother    Allergies: Allergies  Allergen Reactions  . Latex   . Sulfa Antibiotics Rash   Medications: See med rec.  Review of Systems: No fevers, chills, night sweats, weight loss, chest pain, or shortness of breath.   Objective:    General: Well Developed, well nourished, and in no acute distress.  Neuro: Alert and oriented x3,  extra-ocular muscles intact, sensation grossly intact.  HEENT: Normocephalic, atraumatic, pupils equal round reactive to light, neck supple, no masses, no lymphadenopathy, thyroid nonpalpable.  Oropharynx, nasopharynx, ear canals unremarkable Skin: Warm and dry, no rashes. Cardiac: Regular rate and rhythm, no murmurs rubs or gallops, no lower extremity edema.  Respiratory: Clear to auscultation bilaterally. Not using accessory muscles, speaking in full sentences.  Possibly some prolonged expiratory phase.  Impression and Recommendations:    Moderate persistent asthma Subacute pansinusitis, coughing with mild asthma exacerbation. Adding azithromycin, prednisone. Keep further follow-up with allergist and PCP. It may be reasonable in the future to switch to Trelegy instead of Breo and Spiriva  separately. ___________________________________________ Ihor Austin. Benjamin Stain, M.D., ABFM., CAQSM. Primary Care and Sports Medicine [facility]  Adjunct Professor of Family Medicine  [employer]

## 2019-02-06 NOTE — Assessment & Plan Note (Addendum)
Subacute pansinusitis, coughing with mild asthma exacerbation. Adding azithromycin, prednisone. Keep further follow-up with allergist and PCP. It may be reasonable in the future to switch to Trelegy instead of Breo and Spiriva separately.

## 2019-03-05 ENCOUNTER — Telehealth: Payer: Self-pay | Admitting: Pulmonary Disease

## 2019-03-05 DIAGNOSIS — J4540 Moderate persistent asthma, uncomplicated: Secondary | ICD-10-CM

## 2019-03-05 DIAGNOSIS — J4550 Severe persistent asthma, uncomplicated: Secondary | ICD-10-CM

## 2019-03-05 NOTE — Telephone Encounter (Signed)
Tara Campbell, I would like to ask you and Dr. Kendrick Campbell it I could get a portable Oxygen Tank for home use, should the need arise. Two years ago I had the flu and almost died. In fact, this attack was the reason for my first visit to Dr. Kendrick Campbell.  My sister, Tara Campbell, mentioned yesterday that I might want to have a tank on hand, should the need arise.  What do you think? And, could you write the prescription for said tank? I appreciate any help and thoughts you could give to me. Thank you for your help. Stay safe and healthy. Tara Campbell  __________________________  Patient also sent a message on mychart copied above.   Called and spoke with patient she stated that she came down with the flu two years ago and almost died before EMS got to her house. She is worried that she will come down with the flu again or COVID and not have oxygen available. She is requesting o2 to be sent to her home. Patient is not currently on any oxygen. Please advise, thank you.

## 2019-03-09 NOTE — Telephone Encounter (Signed)
Please verify if she is on any oxygen , I did not see this in her chart anywhere .   If she is not on oxygen , she would have to qualify for oxygen (O2 <88%) in order to get oxygen .   She would have to come in or go to DME if they are doing this to qualify for this .

## 2019-03-09 NOTE — Telephone Encounter (Signed)
LMTCB

## 2019-03-10 NOTE — Telephone Encounter (Signed)
LMTCB

## 2019-03-10 NOTE — Telephone Encounter (Signed)
Pt is not on 02 therapy. She just wanted to have access to some if needed. Her sister is a Charity fundraiser and got 2 tanks for $15 for her son. Explained to patient that she has to qualify for 02 to be covered by her insurance. She states she will pay out of pocket. Pt will get details of what's needed and call back.

## 2019-03-10 NOTE — Telephone Encounter (Signed)
Pt is not on 02. Explained to patient she would have to qualify for 02. She would like a rx for 02 so that she may purchase out of pocket. Her sister is a Charity fundraiser and suggested may she could get a rx and purchase.  Please advise

## 2019-03-10 NOTE — Telephone Encounter (Signed)
That is fine for her to have a prescription for 2 oxygen tanks if that is what she wants and will alleviate anxiety for her and family .    Please let her know that is not how we like to do things and not sure if Oxygen is in shortage or now .  oxygen is a medication and should only be used when needed.  O2 saturation should be over 90%.and not to use Oxygen unless <90%.    If she is start to see her oxygen levels dropping and she is become short of breath she needs to call us for guidance and evaluation   Please contact office for sooner follow up if symptoms do not improve or worsen or seek emergency care

## 2019-03-11 NOTE — Telephone Encounter (Signed)
Message received from Patient this morning.  [name], I know you may think I'm a pest about requesting an Oxygen Tank to have at home. However, this would give me great peace of mind to know that if I needed it I'd have it. I am trying to be proactive and stay away from the ER and Hospital. All I need is the E-Cylinder, flo meter and Canula/mask. [name] is my sister and if I get in trouble she would monitor the use of said oxygen. She just got one for her son and the Procedure Code was [phone] and she dealt with [employer]. If you, could review this and let me know what you think, I would greatly appreciate it. Thank You. Tara Campbell    Message routed to [place], NP

## 2019-03-11 NOTE — Telephone Encounter (Signed)
I sent message yesterday that said yes to this already , was it not done , please call patient and let her know I approved yesterday

## 2019-03-11 NOTE — Telephone Encounter (Signed)
LMOM for patient to call re: details of where we would need to place an order for her 02.

## 2019-03-12 NOTE — Telephone Encounter (Signed)
Called and spoke with patient today regarding TP recommendations below. Advised pt of TP concerns and recommendations Placed printed script of O2, nasal canula, and supplies Advised that I am mailing the rx of O2 to the pt's home address Pt verbalized and expressed understanding Nothing further needed.

## 2019-03-17 DIAGNOSIS — J329 Chronic sinusitis, unspecified: Secondary | ICD-10-CM | POA: Diagnosis not present

## 2019-03-17 DIAGNOSIS — J309 Allergic rhinitis, unspecified: Secondary | ICD-10-CM | POA: Diagnosis not present

## 2019-03-17 DIAGNOSIS — J342 Deviated nasal septum: Secondary | ICD-10-CM | POA: Diagnosis not present

## 2019-03-17 DIAGNOSIS — J339 Nasal polyp, unspecified: Secondary | ICD-10-CM | POA: Diagnosis not present

## 2019-04-15 ENCOUNTER — Ambulatory Visit (INDEPENDENT_AMBULATORY_CARE_PROVIDER_SITE_OTHER): Payer: Medicare Other | Admitting: Physician Assistant

## 2019-04-15 ENCOUNTER — Encounter: Payer: Self-pay | Admitting: Physician Assistant

## 2019-04-15 VITALS — BP 168/92 | HR 97 | Temp 96.1°F | Wt 188.0 lb

## 2019-04-15 DIAGNOSIS — J4541 Moderate persistent asthma with (acute) exacerbation: Secondary | ICD-10-CM

## 2019-04-15 MED ORDER — DOXYCYCLINE HYCLATE 100 MG PO TABS: 100.0000 mg | ORAL_TABLET | Freq: Two times a day (BID) | ORAL | 0 refills | Status: AC

## 2019-04-15 MED ORDER — PREDNISONE 50 MG PO TABS: ORAL_TABLET | ORAL | 0 refills | Status: DC

## 2019-04-15 NOTE — Progress Notes (Signed)
Telephone Visit via Video Note  I connected with Tara Campbell on 04/15/19 at 11:10 AM EDT by a video enabled telemedicine application and verified that I am speaking with the correct person using two identifiers.   I discussed the limitations of evaluation and management by telemedicine and the availability of in person appointments. The patient expressed understanding and agreed to proceed.  History of Present Illness: HPI:                                                                Tara Campbell is a 73 y.o. female with moderate persistent asthma, DM2, CAD  Tara Campbell is also present on the call today  CC: wheezing, chest tightness  Reports increased wheezing/chest tightness for the last 1.5 weeks. Associated with increased sputum production that is white/clear. She states allergy symptoms have been increased and environmental allergens are a known asthma trigger for her. Denies fever, chills, malaise, blood-tinged sputum, chest pain. Home pulse ox has been decreased at 94%, typically 98-99% She is using her nebulizer 1-2 times per day for the last 3-4 days She is compliant with her Spiriva and Breo She has not used her home oxygen  Last asthma exacerbation was approx 2 months ago in March. She was treated with Prednisone/Azithromycin.  Past Medical History:  Diagnosis Date  . Allergy   . Asthma   . Colon polyps   . Diabetes mellitus without complication ([place])   . Gout   . Heart attack ([place]) 07/30/2016   PCI, 2 stents  . Hyperlipidemia   . Hypertension   . Internal hemorrhoids   . Left rotator cuff tear   . 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 Otomycosis of right ear 09/27/2017  . Paroxysmal atrial fibrillation (HCC)   . Thyroid disease    Past Surgical History:  Procedure Laterality Date  . ANGIOPLASTY    . COLONOSCOPY W/ POLYPECTOMY    . HEMORRHOID BANDING     Social History   Tobacco Use  . Smoking status: Never Smoker  . Smokeless tobacco: Never Used   Substance Use Topics  . Alcohol use: Yes    Comment: Rare   family history includes Diabetes in her father and paternal grandmother; Glaucoma in her father; Heart disease in her father; Hyperlipidemia in her father and mother; Hypertension in her father and mother; Skin cancer in her father.    ROS: negative except as noted in the HPI  Medications: Current Outpatient Medications  Medication Sig Dispense Refill  . albuterol (PROVENTIL HFA;VENTOLIN HFA) 108 (90 Base) MCG/ACT inhaler Inhale 2 puffs into the lungs every 6 (six) hours as needed for wheezing. 2 Inhaler 1  . allopurinol (ZYLOPRIM) 300 MG tablet Take 1 tablet (300 mg total) by mouth 2 (two) times daily. 180 tablet 3  . apixaban (ELIQUIS) 5 MG TABS tablet Take 1 tablet (5 mg total) by mouth 2 (two) times daily. 60 tablet 2  . atenolol (TENORMIN) 100 MG tablet Take 1 tablet (100 mg total) by mouth daily. 90 tablet 1  . atorvastatin (LIPITOR) 80 MG tablet Take 1 tablet (80 mg total) by mouth at bedtime. 90 tablet 1  . Cholecalciferol (VITAMIN D3 PO) Take 5,000 mg by mouth daily.    . clopidogrel (PLAVIX) 75  MG tablet TAKE 1 TABLET BY MOUTH  DAILY 90 tablet 1  . COLCRYS 0.6 MG tablet Take 1 tablet (0.6 mg total) by mouth daily. 30 tablet 3  . Dulaglutide (TRULICITY) 6.06 TK/1.6WF SOPN Inject 0.5 mLs into the skin once a week. 4 pen 11  . fluticasone (FLONASE) 50 MCG/ACT nasal spray Place 1 spray into both nostrils daily. 16 g 3  . fluticasone furoate-vilanterol (BREO ELLIPTA) 200-25 MCG/INH AEPB Inhale 1 puff into the lungs daily. 1 each 5  . ipratropium (ATROVENT) 0.06 % nasal spray Place 2 sprays into both nostrils 4 (four) times daily as needed for rhinitis. 15 mL 0  . irbesartan-hydrochlorothiazide (AVALIDE) 300-12.5 MG tablet Take 1 tablet by mouth daily. 90 tablet 1  . levocetirizine (XYZAL ALLERGY 24HR) 5 MG tablet Take 1 tablet (5 mg total) by mouth every evening. 90 tablet 3  . levothyroxine (SYNTHROID, LEVOTHROID) 112 MCG  tablet Take 1 tablet (112 mcg total) by mouth daily before breakfast. 90 tablet 1  . metFORMIN (GLUCOPHAGE) 1000 MG tablet Take 1 tablet (1,000 mg total) by mouth 2 (two) times daily with a meal. 180 tablet 3  . Tiotropium Bromide Monohydrate (SPIRIVA RESPIMAT) 2.5 MCG/ACT AERS Inhale 2 puffs into the lungs daily. 1 Inhaler 5  . azithromycin (ZITHROMAX Z-PAK) 250 MG tablet Take 2 tablets (500 mg) on  Day 1,  followed by 1 tablet (250 mg) once daily on Days 2 through 5. (Patient not taking: Reported on 04/15/2019) 6 tablet 0  . blood glucose meter kit and supplies KIT Check morning fasting blood glucose daily and up to 4 times daily as directed 1 each 0  . Cyanocobalamin (B-12 PO) Take 1,000 mg by mouth daily.    . empagliflozin (JARDIANCE) 25 MG TABS tablet Take 25 mg by mouth daily. 90 tablet 1  . ipratropium-albuterol (DUONEB) 0.5-2.5 (3) MG/3ML SOLN Take 3 mLs by nebulization every 4 (four) hours as needed. (Patient not taking: Reported on 04/15/2019) 3 mL 0  . montelukast (SINGULAIR) 10 MG tablet Take 1 tablet (10 mg total) by mouth at bedtime. 90 tablet 3  . NEBULIZER SYSTEM ALL-IN-ONE MISC 1 Device by Does not apply route every 4 (four) hours as needed. 1 each 0  . nitroGLYCERIN (NITROSTAT) 0.4 MG SL tablet Place 1 tablet (0.4 mg total) under the tongue every 5 (five) minutes as needed for chest pain (or tightness). (Patient not taking: Reported on 04/15/2019) 30 tablet 0  . Omega-3 Fatty Acids (FISH OIL PO) Take 5,000 Units by mouth daily.    . predniSONE (DELTASONE) 50 MG tablet One tab PO daily for 5 days. (Patient not taking: Reported on 04/15/2019) 5 tablet 0   No current facility-administered medications for this visit.    Allergies  Allergen Reactions  . Latex   . Sulfa Antibiotics Rash       Objective:  BP (!) 168/92   Pulse 97   Temp (!) 96.1 F (35.6 C)   Wt 188 lb (85.3 kg)   SpO2 94%   BMI 32.27 kg/m   Pulm: Normal work of breathing, normal phonation, speaking in full  sentences Neuro: alert and oriented x 3    No results found for this or any previous visit (from the past 72 hour(s)). No results found.    Assessment and Plan: 73 y.o. female with   .Diagnoses and all orders for this visit:  Moderate persistent asthma with acute exacerbation  Other orders -     predniSONE (DELTASONE) 50  MG tablet; One tab PO daily for 5 days. -     doxycycline (VIBRA-TABS) 100 MG tablet; Take 1 tablet (100 mg total) by mouth 2 (two) times daily for 7 days.     Vital signs reviewed.  Home temperature reading is low at 96.1.  Home blood pressure reading is elevated.  She is relatively hypoxic at 94% on room air at rest. On limited telephone evaluation she does not appear to be in respiratory distress and is speaking in full sentences, not coughing. Treating for mild asthma exacerbation with prednisone burst Symptoms do new not sound infectious; specifically no fever, malaise, purulent or blood-tinged sputum, however pneumonia cannot be completely excluded I have sent in a prescription for doxycycline since she was recently treated with azithromycin only 2 months ago.  She will not start the antibiotic unless symptoms worsen. Continue duo nebs twice daily Declined to schedule follow-up visit in 2 days, however she did agree to contact me over my chart or by phone if she is not feeling better on Friday   Follow Up Instructions:    I discussed the assessment and treatment plan with the patient. The patient was provided an opportunity to ask questions and all were answered. The patient agreed with the plan and demonstrated an understanding of the instructions.   The patient was advised to call back or seek an in-person evaluation if the symptoms worsen or if the condition fails to improve as anticipated.  I provided 11-20 minutes of non-face-to-face time during this encounter.   [name], [place]

## 2019-04-17 ENCOUNTER — Encounter: Payer: Self-pay | Admitting: Physician Assistant

## 2019-04-21 ENCOUNTER — Ambulatory Visit (INDEPENDENT_AMBULATORY_CARE_PROVIDER_SITE_OTHER): Payer: Medicare Other

## 2019-04-21 ENCOUNTER — Other Ambulatory Visit: Payer: Self-pay

## 2019-04-21 DIAGNOSIS — R590 Localized enlarged lymph nodes: Secondary | ICD-10-CM | POA: Diagnosis not present

## 2019-04-21 DIAGNOSIS — I700 Atherosclerosis of aorta: Secondary | ICD-10-CM | POA: Diagnosis not present

## 2019-04-30 NOTE — Progress Notes (Signed)
Called spoke with patient, advised of CT results / recs as stated by [facility] NP.  Pt verbalized understanding and denied any questions.  CT results routed to PCP via Epic.

## 2019-05-29 ENCOUNTER — Ambulatory Visit: Payer: Medicare Other | Admitting: Adult Health

## 2019-06-12 ENCOUNTER — Encounter: Payer: Self-pay | Admitting: Adult Health

## 2019-06-12 ENCOUNTER — Ambulatory Visit (INDEPENDENT_AMBULATORY_CARE_PROVIDER_SITE_OTHER): Payer: Medicare Other | Admitting: Adult Health

## 2019-06-12 ENCOUNTER — Other Ambulatory Visit: Payer: Self-pay

## 2019-06-12 DIAGNOSIS — J4550 Severe persistent asthma, uncomplicated: Secondary | ICD-10-CM

## 2019-06-12 DIAGNOSIS — J3089 Other allergic rhinitis: Secondary | ICD-10-CM | POA: Diagnosis not present

## 2019-06-12 NOTE — Progress Notes (Signed)
Virtual Visit via Telephone Note  I connected with Tara Campbell on 06/12/19 at  3:00 PM EDT by telephone and verified that I am speaking with the correct person using two identifiers.  Location: Patient: Home  Provider: Office    I discussed the limitations, risks, security and privacy concerns of performing an evaluation and management service by telephone and the availability of in person appointments. I also discussed with the patient that there may be a patient responsible charge related to this service. The patient expressed understanding and agreed to proceed.   History of Present Illness: 73 year old female never smoker seen for pulmonary consult January 2019 for asthma Medical history significant for diabetes, hypertension  Todays Televisit is for 4 month follow up for Asthma.  Says overall asthma has been doing okay for the last couple weeks however for the last 6 months she has had multiple exacerbations requiring intermittent antibiotics and steroids.  She has been seen by ENT she has been found to have nasal polyposis.  She says she is currently on allergy drops that are sublingual.  She is on sure of the name of this medication.  She remains on Flonase and Singulair.  She is on [place]. Says that she gets intermittent cough and wheezing.  This seems to be an ongoing problem for her. Work-up has showed elevated IgE and elevated eosinophils on CBC.  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  She denies any fever, discolored mucus, chest pain.  She has nasal congestion and drainage daily.  Patient does have underlying diabetes    Observations/Objective: Today VS :  Temp 96.4, B/P 140/74 O2 sats 94-96% on room air HR 100   PFT: 01/2018 PFT: Ratio 69%, FEV1 1.24L 57% pred,total lung capacity 4.04 L 80% predicted, DLCO 20.06 mL 82% predicted  12/2017 CBC: absolute eos count 1000 cell/dL 12/2017 IgE  714  11/2018  spirometry shows improvement with FEV1 at 75%, ratio 74, FVC 76%.  Exhaled nitric oxide testing is improved as well at 43ppb   CT chest Apr 22, 2019 showed no evidence of mediastinal adenopathy, hepatic steatosis and coronary artery disease   Assessment and Plan: Severe persistent asthma with frequent exacerbations-allergic phenotype For now we will continue on her aggressive regimen.  Continue with follow-up with ENT.  Consider referral to allergist or consideration of escalation of care as patient may benefit from a biologic such as Xolair, [place] , etc .   Plan  Patient Instructions  Continue on Breo 200 1 puff daily, rinse after use Continue on Spiriva 2 puffs daily, rinse after use Continue ENT follow up  Call office back with name of allergy drops.  Xyzal daily Continue on Flonase 2 puffs daily Continue on Singulair daily.  Continue on with sinus rinses.  Follow-up with Dr. Vaughan Browner  or [place] NP in 6- 8 weeks  and As needed   Please contact office for sooner follow up if symptoms do not improve or worsen or seek emergency care       Follow Up Instructions: Follow up in 6-8 weeks and As needed     I discussed the assessment and treatment plan with the patient. The patient was provided an opportunity to ask questions and all were answered. The patient agreed with the plan and demonstrated an understanding of the instructions.   The patient was advised to call back or seek an in-person evaluation if the symptoms  worsen or if the condition fails to improve as anticipated.  I provided 25  minutes of non-face-to-face time during this encounter.   Rexene Edison, NP

## 2019-06-12 NOTE — Patient Instructions (Addendum)
Continue on Breo 200 1 puff daily, rinse after use Continue on Spiriva 2 puffs daily, rinse after use Continue ENT follow up  Call office back with name of allergy drops.  Xyzal daily Continue on Flonase 2 puffs daily Continue on Singulair daily.  Continue on with sinus rinses.  Follow-up with Dr. Vaughan Browner  or Jeneen Doutt NP in 6- 8 weeks  and As needed   Please contact office for sooner follow up if symptoms do not improve or worsen or seek emergency care

## 2019-06-17 ENCOUNTER — Other Ambulatory Visit: Payer: Self-pay | Admitting: Physician Assistant

## 2019-06-17 DIAGNOSIS — J339 Nasal polyp, unspecified: Secondary | ICD-10-CM | POA: Diagnosis not present

## 2019-06-17 DIAGNOSIS — J019 Acute sinusitis, unspecified: Secondary | ICD-10-CM

## 2019-06-17 DIAGNOSIS — J329 Chronic sinusitis, unspecified: Secondary | ICD-10-CM | POA: Diagnosis not present

## 2019-06-17 DIAGNOSIS — J309 Allergic rhinitis, unspecified: Secondary | ICD-10-CM | POA: Diagnosis not present

## 2019-06-17 DIAGNOSIS — J342 Deviated nasal septum: Secondary | ICD-10-CM | POA: Diagnosis not present

## 2019-07-07 ENCOUNTER — Other Ambulatory Visit: Payer: Self-pay | Admitting: Adult Health

## 2019-07-27 ENCOUNTER — Encounter: Payer: Self-pay | Admitting: Physician Assistant

## 2019-08-03 ENCOUNTER — Ambulatory Visit: Payer: Medicare Other

## 2019-08-22 ENCOUNTER — Emergency Department (HOSPITAL_BASED_OUTPATIENT_CLINIC_OR_DEPARTMENT_OTHER)
Admission: EM | Admit: 2019-08-22 | Discharge: 2019-08-22 | Disposition: A | Discharge status: Home / Self Care | Payer: Medicare Other | Attending: Emergency Medicine | Admitting: Emergency Medicine

## 2019-08-22 ENCOUNTER — Other Ambulatory Visit: Payer: Self-pay

## 2019-08-22 ENCOUNTER — Encounter (HOSPITAL_BASED_OUTPATIENT_CLINIC_OR_DEPARTMENT_OTHER): Payer: Self-pay | Admitting: Emergency Medicine

## 2019-08-22 DIAGNOSIS — E039 Hypothyroidism, unspecified: Secondary | ICD-10-CM | POA: Diagnosis not present

## 2019-08-22 DIAGNOSIS — R21 Rash and other nonspecific skin eruption: Secondary | ICD-10-CM | POA: Diagnosis present

## 2019-08-22 DIAGNOSIS — Z91040 Latex allergy status: Secondary | ICD-10-CM | POA: Insufficient documentation

## 2019-08-22 DIAGNOSIS — Z7901 Long term (current) use of anticoagulants: Secondary | ICD-10-CM | POA: Insufficient documentation

## 2019-08-22 DIAGNOSIS — J45909 Unspecified asthma, uncomplicated: Secondary | ICD-10-CM | POA: Diagnosis not present

## 2019-08-22 DIAGNOSIS — Z79899 Other long term (current) drug therapy: Secondary | ICD-10-CM | POA: Insufficient documentation

## 2019-08-22 DIAGNOSIS — Z882 Allergy status to sulfonamides status: Secondary | ICD-10-CM | POA: Diagnosis not present

## 2019-08-22 DIAGNOSIS — I480 Paroxysmal atrial fibrillation: Secondary | ICD-10-CM | POA: Insufficient documentation

## 2019-08-22 DIAGNOSIS — Z7984 Long term (current) use of oral hypoglycemic drugs: Secondary | ICD-10-CM | POA: Insufficient documentation

## 2019-08-22 DIAGNOSIS — E782 Mixed hyperlipidemia: Secondary | ICD-10-CM | POA: Insufficient documentation

## 2019-08-22 DIAGNOSIS — E1169 Type 2 diabetes mellitus with other specified complication: Secondary | ICD-10-CM | POA: Diagnosis not present

## 2019-08-22 DIAGNOSIS — I10 Essential (primary) hypertension: Secondary | ICD-10-CM | POA: Diagnosis not present

## 2019-08-22 DIAGNOSIS — L509 Urticaria, unspecified: Secondary | ICD-10-CM | POA: Insufficient documentation

## 2019-08-22 DIAGNOSIS — I2510 Atherosclerotic heart disease of native coronary artery without angina pectoris: Secondary | ICD-10-CM | POA: Diagnosis not present

## 2019-08-22 DIAGNOSIS — I252 Old myocardial infarction: Secondary | ICD-10-CM | POA: Diagnosis not present

## 2019-08-22 LAB — CBG MONITORING, ED: Glucose-Capillary: 286 mg/dL — ABNORMAL HIGH (ref 70–99)

## 2019-08-22 MED ORDER — PREDNISONE 20 MG PO TABS: 40.0000 mg | ORAL_TABLET | Freq: Every day | ORAL | 0 refills | Status: DC

## 2019-08-22 MED ORDER — HYDROXYZINE HCL 25 MG PO TABS: 25.0000 mg | ORAL_TABLET | Freq: Four times a day (QID) | ORAL | 0 refills | Status: DC | PRN

## 2019-08-22 NOTE — ED Triage Notes (Signed)
Pt here with rash in folds of skin since 2:30 this morning. Does not recall being exposed to anything. Has taken Benadryl all day today with mild relief.

## 2019-08-22 NOTE — Discharge Instructions (Signed)
Please read and follow all provided instructions.  Your diagnoses today include:  1. Urticaria    Tests performed today include:  Blood sugar - elevated  Vital signs. See below for your results today.   Medications prescribed:   Prednisone - steroid medicine   It is best to take this medication in the morning to prevent sleeping problems. If you are diabetic, monitor your blood sugar closely and stop taking Prednisone if blood sugar is over 300. Take with food to prevent stomach upset.    Hydroxyzine - antihistamine  You can find this medication over-the-counter.   This medication will make you drowsy. DO NOT drive or perform any activities that require you to be awake and alert if taking this.  Take any prescribed medications only as directed.  Home care instructions:  Follow any educational materials contained in this packet.  BE VERY CAREFUL not to take multiple medicines containing Tylenol (also called acetaminophen). Doing so can lead to an overdose which can damage your liver and cause liver failure and possibly death.   Follow-up instructions: Please follow-up with your primary care provider in the next 3 days for further evaluation of your symptoms.   Return instructions:   Please return to the Emergency Department if you experience worsening symptoms.   Please return if you have any other emergent concerns.  Additional Information:  Your vital signs today were: BP (!) 193/86 (BP Location: Left Arm)    Pulse (!) 105    Temp 98.3 F (36.8 C) (Oral)    Resp 16    Ht 5\' 4"  (1.626 m)    Wt 86.2 kg    SpO2 95%    BMI 32.61 kg/m  If your blood pressure (BP) was elevated above 135/85 this visit, please have this repeated by your doctor within one month. --------------

## 2019-08-22 NOTE — ED Provider Notes (Signed)
[facility] Provider Note   CSN: [id number] Arrival date & time: 08/22/19  1710     History   Chief Complaint Chief Complaint  Patient presents with  . Rash    HPI Tara Campbell is a 73 y.o. female.     Patient with history of diabetes presents the emergency department today with complaint of urticaria.  Patient states that she woke this morning with hives on her scalp with intense itching.  She states that warm water made it better temporarily and she was able to sleep.  Urticaria persisted in skin folds today on her trunk.  She took Benadryl with minimal relief of symptoms.  No facial swelling, lip or tongue swelling, trouble breathing, vomiting, diarrhea.  No lightheadedness or syncope.  She is uncertain as to the trigger of her urticaria.  She does not have any significant allergy symptoms in the past.  No new medications or skin exposures.  She did eat leftover food last night.     Past Medical History:  Diagnosis Date  . Allergy   . Asthma   . Colon polyps   . Diabetes mellitus without complication ([place])   . Gout   . Heart attack ([place]) 07/30/2016   PCI, 2 stents  . Hyperlipidemia   . Hypertension   . Internal hemorrhoids   . Left rotator cuff tear   . 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 Otomycosis of right ear 09/27/2017  . Paroxysmal atrial fibrillation ([facility])   . Thyroid disease     Patient Active Problem List   Diagnosis Date Noted  . Hilar adenopathy 10/13/2018  . Mediastinal adenopathy 10/13/2018  . Dupuytren's contracture of left hand 08/28/2018  . Trigger finger, left index finger 08/28/2018  . Non-seasonal allergic rhinitis 08/26/2018  . 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 Encounter for weight loss counseling 03/06/2018  . Uncontrolled type 2 diabetes mellitus with hyperglycemia ([place]) 12/27/2017  . Class 1 obesity due to excess calories with serious comorbidity in adult  12/27/2017  . 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 Encounter for long-term (current) use of medications 11/28/2017  . Anticoagulant long-term use 10/21/2017  . Sensorineural hearing loss (SNHL) of both ears 09/27/2017  . Hypertension goal BP (blood pressure) < 140/90 08/29/2017  . Coronary artery disease involving native coronary artery of native heart without angina pectoris 08/29/2017  . Need for shingles vaccine 05/31/2017  . Encounter for monitoring statin therapy 05/29/2017  . Blurred vision 05/29/2017  . Hearing difficulty of both ears 05/29/2017  . Trochanteric bursitis, right hip 05/29/2017  . Gout of ankle 04/08/2017  . History of myocardial infarct at age greater than 32 years 01/23/2017  . Mixed diabetic hyperlipidemia associated with type 2 diabetes mellitus ([place]) 01/23/2017  . Acquired hypothyroidism 01/23/2017  . Moderate persistent asthma 01/16/2017  . Paroxysmal atrial fibrillation ([place]) 01/16/2017  . Cardiomegaly 01/16/2017    Past Surgical History:  Procedure Laterality Date  . ANGIOPLASTY    . COLONOSCOPY W/ POLYPECTOMY    . HEMORRHOID BANDING       OB History   No obstetric history on file.      Home Medications    Prior to Admission medications   Medication Sig Start Date End Date Taking? Authorizing Provider  albuterol (PROVENTIL HFA;VENTOLIN HFA) 108 (90 Base) MCG/ACT inhaler Inhale 2 puffs into the lungs every 6 (six) hours as needed for wheezing. 08/29/17   [name], PA-C  allopurinol (ZYLOPRIM) 300 MG tablet Take 1 tablet (300 mg total) by mouth 2 (two) times daily. 08/28/18   Silverio Decamp, MD  apixaban (ELIQUIS) 5 MG TABS tablet Take 1 tablet (5 mg total) by mouth 2 (two) times daily. 01/23/17   [name], PA-C  atenolol (TENORMIN) 100 MG tablet Take 1 tablet (100 mg total) by mouth daily. 07/24/18   [name], PA-C  atorvastatin (LIPITOR) 80 MG tablet Take 1 tablet (80 mg total) by mouth at  bedtime. 07/24/18   [name], PA-C  blood glucose meter kit and supplies KIT Check morning fasting blood glucose daily and up to 4 times daily as directed 04/03/18   [name], PA-C  BREO ELLIPTA 200-25 MCG/INH AEPB Inhale 1 puff into the lungs daily. 07/07/19   Parrett, Fonnie Mu, NP  Cholecalciferol (VITAMIN D3 PO) Take 5,000 mg by mouth daily.    [provider]  clopidogrel (PLAVIX) 75 MG tablet TAKE 1 TABLET BY MOUTH  DAILY 12/05/17   [name], PA-C  COLCRYS 0.6 MG tablet Take 1 tablet (0.6 mg total) by mouth daily. 07/25/18   Silverio Decamp, MD  Cyanocobalamin (B-12 PO) Take 1,000 mg by mouth daily.    [provider]  Dulaglutide (TRULICITY) 5.28 UX/3.2GM SOPN Inject 0.5 mLs into the skin once a week. 03/06/18   [name], PA-C  fluticasone [facility]) 50 MCG/ACT nasal spray Place 1 spray into both nostrils daily. 08/25/18   [name], PA-C  hydrOXYzine (ATARAX/VISTARIL) 25 MG tablet Take 1 tablet (25 mg total) by mouth every 6 (six) hours as needed for itching (Take at bedtime). 08/22/19   Carlisle Cater, PA-C  ipratropium-albuterol (DUONEB) 0.5-2.5 (3) MG/3ML SOLN Take 3 mLs by nebulization every 4 (four) hours as needed. 02/27/18   [name], PA-C  irbesartan-hydrochlorothiazide (AVALIDE) 300-12.5 MG tablet Take 1 tablet by mouth daily. 07/24/18   [name], PA-C  levocetirizine [facility] ALLERGY 24HR) 5 MG tablet Take 1 tablet (5 mg total) by mouth every evening. 08/25/18   [name], PA-C  levothyroxine (SYNTHROID, LEVOTHROID) 112 MCG tablet Take 1 tablet (112 mcg total) by mouth daily before breakfast. 07/24/18   [name], PA-C  metFORMIN (GLUCOPHAGE) 1000 MG tablet Take 1 tablet (1,000 mg total) by mouth 2 (two) times daily with a meal. 04/03/18   [name], PA-C  montelukast (SINGULAIR) 10 MG tablet Take 1  tablet (10 mg total) by mouth at bedtime. 08/25/18   [name], PA-C  NEBULIZER SYSTEM ALL-IN-ONE MISC 1 Device by Does not apply route every 4 (four) hours as needed. 02/27/18   [name], PA-C  nitroGLYCERIN (NITROSTAT) 0.4 MG SL tablet Place 1 tablet (0.4 mg total) under the tongue every 5 (five) minutes as needed for chest pain (or tightness). 03/11/18   [name], PA-C  Omega-3 Fatty Acids (FISH OIL PO) Take 5,000 Units by mouth daily.    [provider]  predniSONE (DELTASONE) 20 MG tablet Take 2 tablets (40 mg total) by mouth daily. 08/22/19   Carlisle Cater, PA-C  Tiotropium Bromide Monohydrate (SPIRIVA RESPIMAT) 2.5 MCG/ACT AERS Inhale 2 puffs into the lungs daily. 10/13/18   Parrett, Fonnie Mu, NP    Family History Family History  Problem Relation Age of Onset  . Hypertension Mother   . Hyperlipidemia Mother   . Glaucoma Father   . Heart disease Father   . Hypertension Father   .  Diabetes Father   . Hyperlipidemia Father   . Skin cancer Father   . Diabetes Paternal Grandmother     Social History Social History   Tobacco Use  . Smoking status: Never Smoker  . Smokeless tobacco: Never Used  Substance Use Topics  . Alcohol use: Yes    Comment: Rare  . Drug use: No     Allergies   Latex and Sulfa antibiotics   Review of Systems Review of Systems  Constitutional: Negative for fever.  HENT: Negative for facial swelling and trouble swallowing.   Eyes: Negative for redness.  Respiratory: Negative for shortness of breath, wheezing and stridor.   Cardiovascular: Negative for chest pain.  Gastrointestinal: Negative for nausea and vomiting.  Musculoskeletal: Negative for myalgias.  Skin: Positive for rash.  Neurological: Negative for light-headedness.  Psychiatric/Behavioral: Negative for confusion.     Physical Exam Updated Vital Signs BP (!) 193/86 (BP Location: Left Arm)   Pulse (!) 105   Temp 98.3 F  (36.8 C) (Oral)   Resp 16   Ht '5\' 4"'  (1.626 m)   Wt 86.2 kg   SpO2 95%   BMI 32.61 kg/m   Physical Exam Vitals signs and nursing note reviewed.  Constitutional:      Appearance: She is well-developed.  HENT:     Head: Normocephalic and atraumatic.     Comments: No lip or tongue swelling, no angioedema. Eyes:     Conjunctiva/sclera: Conjunctivae normal.  Neck:     Musculoskeletal: Normal range of motion and neck supple.  Pulmonary:     Effort: No respiratory distress.  Skin:    General: Skin is warm and dry.     Comments: Urticaria on the frontal scalp, mid back, underneath the skin folds under the breasts.  Neurological:     Mental Status: She is alert.      ED Treatments / Results  Labs (all labs ordered are listed, but only abnormal results are displayed) Labs Reviewed  CBG MONITORING, ED - Abnormal; Notable for the following components:      Result Value   Glucose-Capillary 286 (*)    All other components within normal limits    EKG None  Radiology No results found.  Procedures Procedures (including critical care time)  Medications Ordered in ED Medications - No data to display   Initial Impression / Assessment and Plan / ED Course  I have reviewed the triage vital signs and the nursing notes.  Pertinent labs & imaging results that were available during my care of the patient were reviewed by me and considered in my medical decision making (see chart for details).        Patient seen and examined.  Blood sugar checked and is in the 280s.  Discussed use of prednisone with Dr. Sherry Ruffing.  Patient will be prescribed 4-day burst of 40 mg.  Patient agrees to check her blood sugars at home daily and discontinue medication if her blood sugar is over 300.  We will also give prescription for hydroxyzine.  She will use OTC loratadine during the day.   Vital signs reviewed and are as follows: BP (!) 193/86 (BP Location: Left Arm)   Pulse (!) 105   Temp 98.3  F (36.8 C) (Oral)   Resp 16   Ht '5\' 4"'  (1.626 m)   Wt 86.2 kg   SpO2 95%   BMI 32.61 kg/m     Final Clinical Impressions(s) / ED Diagnoses   Final diagnoses:  Urticaria  Patient with urticaria, no anaphylaxis.  History of diabetes, prescribed prednisone but patient is instructed to very carefully monitor her blood sugars and not take her blood sugars greater than 300.  ED Discharge Orders         Ordered    predniSONE (DELTASONE) 20 MG tablet  Daily     08/22/19 1817    hydrOXYzine (ATARAX/VISTARIL) 25 MG tablet  Every 6 hours PRN,   Status:  Discontinued     08/22/19 1817    hydrOXYzine (ATARAX/VISTARIL) 25 MG tablet  Every 6 hours PRN     08/22/19 1822           Carlisle Cater, PA-C 08/22/19 1828    Tegeler, Gwenyth Allegra, MD 08/22/19 [phone]

## 2019-08-22 NOTE — ED Notes (Signed)
cbg 286  

## 2019-08-22 NOTE — ED Notes (Signed)
ED Provider at bedside. 

## 2019-08-24 ENCOUNTER — Encounter: Payer: Self-pay | Admitting: Sports Medicine

## 2019-08-24 ENCOUNTER — Ambulatory Visit (INDEPENDENT_AMBULATORY_CARE_PROVIDER_SITE_OTHER): Payer: Medicare Other | Admitting: Sports Medicine

## 2019-08-24 ENCOUNTER — Ambulatory Visit: Payer: Medicare Other

## 2019-08-24 DIAGNOSIS — L509 Urticaria, unspecified: Secondary | ICD-10-CM | POA: Diagnosis not present

## 2019-08-24 MED ORDER — DIPHENHYDRAMINE HCL 25 MG PO TABS: 25.0000 mg | ORAL_TABLET | Freq: Three times a day (TID) | ORAL | Status: DC

## 2019-08-24 MED ORDER — PREDNISONE 10 MG (48) PO TBPK: ORAL_TABLET | Freq: Every day | ORAL | 0 refills | Status: DC

## 2019-08-24 NOTE — Assessment & Plan Note (Signed)
Unidentified trigger, she did have some fatigue prior to developing the urticaria so this may have been a virally mediated phenomenon. Adding a 12-day prednisone taper, continue Benadryl 25 3 times daily. Return as needed for this.

## 2019-08-24 NOTE — Patient Instructions (Signed)
Hives Hives (urticaria) are itchy, red, swollen areas on the skin. Hives can appear on any part of the body. Hives often fade within 24 hours (acute hives). Sometimes, new hives appear after old ones fade and the cycle can continue for several days or weeks (chronic hives). Hives do not spread from person to person (are not contagious). Hives come from the body's reaction to something a person is allergic to (allergen), something that causes irritation, or various other triggers. When a person is exposed to a trigger, his or her body releases a chemical (histamine) that causes redness, itching, and swelling. Hives can appear right after exposure to a trigger or hours later. What are the causes? This condition may be caused by:  Allergies to foods or ingredients.  Insect bites or stings.  Exposure to pollen or pets.  Contact with latex or chemicals.  Spending time in sunlight, heat, or cold (exposure).  Exercise.  Stress.  Certain medicines. You can also get hives from other medical conditions and treatments, such as:  Viruses, including the common cold.  Bacterial infections, such as urinary tract infections and strep throat.  Certain medicines.  Allergy shots.  Blood transfusions. Sometimes, the cause of this condition is not known (idiopathic hives). What increases the risk? You are more likely to develop this condition if you:  Are a woman.  Have food allergies, especially to citrus fruits, milk, eggs, peanuts, tree nuts, or shellfish.  Are allergic to: ? Medicines. ? Latex. ? Insects. ? Animals. ? Pollen. What are the signs or symptoms? Common symptoms of this condition include raised, itchy, red or white bumps or patches on your skin. These areas may:  Become large and swollen (welts).  Change in shape and location, quickly and repeatedly.  Be separate hives or connect over a large area of skin.  Sting or become painful.  Turn white when pressed in the  center (blanch). In severe cases, yourhands, feet, and face may also become swollen. This may occur if hives develop deeper in your skin. How is this diagnosed? This condition may be diagnosed by your symptoms, medical history, and physical exam.  Your skin, urine, or blood may be tested to find out what is causing your hives and to rule out other health issues.  Your health care provider may also remove a small sample of skin from the affected area and examine it under a microscope (biopsy). How is this treated? Treatment for this condition depends on the cause and severity of your symptoms. Your health care provider may recommend using cool, wet cloths (cool compresses) or taking cool showers to relieve itching. Treatment may include:  Medicines that help: ? Relieve itching (antihistamines). ? Reduce swelling (corticosteroids). ? Treat infection (antibiotics).  An injectable medicine (omalizumab). Your health care provider may prescribe this if you have chronic idiopathic hives and you continue to have symptoms even after treatment with antihistamines. Severe cases may require an emergency injection of adrenaline (epinephrine) to prevent a life-threatening allergic reaction (anaphylaxis). Follow these instructions at home: Medicines  Take and apply over-the-counter and prescription medicines only as told by your health care provider.  If you were prescribed an antibiotic medicine, take it as told by your health care provider. Do not stop using the antibiotic even if you start to feel better. Skin care  Apply cool compresses to the affected areas.  Do not scratch or rub your skin. General instructions  Do not take hot showers or baths. This can make itching   worse.  Do not wear tight-fitting clothing.  Use sunscreen and wear protective clothing when you are outside.  Avoid any substances that cause your hives. Keep a journal to help track what causes your hives. Write down: ?  What medicines you take. ? What you eat and drink. ? What products you use on your skin.  Keep all follow-up visits as told by your health care provider. This is important. Contact a health care provider if:  Your symptoms are not controlled with medicine.  Your joints are painful or swollen. Get help right away if:  You have a fever.  You have pain in your abdomen.  Your tongue or lips are swollen.  Your eyelids are swollen.  Your chest or throat feels tight.  You have trouble breathing or swallowing. These symptoms may represent a serious problem that is an emergency. Do not wait to see if the symptoms will go away. Get medical help right away. Call your local emergency services (911 in the [place] Do not drive yourself to the hospital. Summary  Hives (urticaria) are itchy, red, swollen areas on your skin. Hives come from the body's reaction to something a person is allergic to (allergen), something that causes irritation, or various other triggers.  Treatment for this condition depends on the cause and severity of your symptoms.  Avoid any substances that cause your hives. Keep a journal to help track what causes your hives.  Take and apply over-the-counter and prescription medicines only as told by your health care provider.  Keep all follow-up visits as told by your health care provider. This is important. This information is not intended to replace advice given to you by your health care provider. Make sure you discuss any questions you have with your health care provider. Document Released: 11/19/2005 Document Revised: 06/04/2018 Document Reviewed: 06/04/2018 Elsevier Patient Education  2020 Elsevier Inc.  

## 2019-08-24 NOTE — Progress Notes (Signed)
Subjective:    CC: Follow-up  HPI: This is a very pleasant 73 year old female, 2 days ago she woke up with an itchy rash on her scalp, torso, abdomen, arms and legs.  She was seen in the ED, diagnosed with urticaria.  She never actually got the prednisone prescribed, took some hydroxyzine which was prescribed without any improvement.  She denies any known exposures, she did have pastrami from a new deli, she also endorses feeling a bit tired prior to the rash popping up.  No shortness of breath, no GI symptoms, no symptoms in the mucosa.  I reviewed the past medical history, family history, social history, surgical history, and allergies today and no changes were needed.  Please see the problem list section below in epic for further details.  Past Medical History: Past Medical History:  Diagnosis Date  . Allergy   . Asthma   . Colon polyps   . Diabetes mellitus without complication ([facility])   . Gout   . Heart attack ([facility]) 07/30/2016   PCI, 2 stents  . Hyperlipidemia   . Hypertension   . Internal hemorrhoids   . Left rotator cuff tear   . 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 Otomycosis of right ear 09/27/2017  . Paroxysmal atrial fibrillation ([facility])   . Thyroid disease    Past Surgical History: Past Surgical History:  Procedure Laterality Date  . ANGIOPLASTY    . COLONOSCOPY W/ POLYPECTOMY    . HEMORRHOID BANDING     Social History: Social History   Socioeconomic History  . Marital status: Divorced    Spouse name: Not on file  . Number of children: 2  . Years of education: Not on file  . Highest education level: Not on file  Occupational History  . Not on file  Social Needs  . Financial resource strain: Not on file  . Food insecurity    Worry: Not on file    Inability: Not on file  . Transportation needs    Medical: Not on file    Non-medical: Not on file  Tobacco Use  . Smoking status: Never Smoker  . Smokeless tobacco: Never Used  Substance and Sexual Activity  .  Alcohol use: Yes    Comment: Rare  . Drug use: No  . Sexual activity: Not Currently  Lifestyle  . Physical activity    Days per week: Not on file    Minutes per session: Not on file  . Stress: Not on file  Relationships  . Social Musician on phone: Not on file    Gets together: Not on file    Attends religious service: Not on file    Active member of club or organization: Not on file    Attends meetings of clubs or organizations: Not on file    Relationship status: Not on file  Other Topics Concern  . Not on file  Social History Narrative  . Not on file   Family History: Family History  Problem Relation Age of Onset  . Hypertension Mother   . Hyperlipidemia Mother   . Glaucoma Father   . Heart disease Father   . Hypertension Father   . Diabetes Father   . Hyperlipidemia Father   . Skin cancer Father   . Diabetes Paternal Grandmother    Allergies: Allergies  Allergen Reactions  . Latex   . Sulfa Antibiotics Rash   Medications: See med rec.  Review of Systems: No fevers, chills, night sweats,  weight loss, chest pain, or shortness of breath.   Objective:    General: Well Developed, well nourished, and in no acute distress.  Neuro: Alert and oriented x3, extra-ocular muscles intact, sensation grossly intact.  HEENT: Normocephalic, atraumatic, pupils equal round reactive to light, neck supple, no masses, no lymphadenopathy, thyroid nonpalpable.  Skin: Warm and dry, widespread urticaria.  No mucosal involvement. Cardiac: Regular rate and rhythm, no murmurs rubs or gallops, no lower extremity edema.  Respiratory: Clear to auscultation bilaterally. Not using accessory muscles, speaking in full sentences.  Impression and Recommendations:    Urticaria Unidentified trigger, she did have some fatigue prior to developing the urticaria so this may have been a virally mediated phenomenon. Adding a 12-day prednisone taper, continue Benadryl 25 3 times daily.  Return as needed for this.   ___________________________________________ Gwen Her. Dianah Field, M.D., ABFM., CAQSM. Primary Care and Sports Medicine [facility]  Adjunct Professor of [employer]

## 2019-08-31 ENCOUNTER — Encounter: Payer: Self-pay | Admitting: Sports Medicine

## 2019-09-20 ENCOUNTER — Other Ambulatory Visit: Payer: Self-pay | Admitting: Adult Health

## 2019-09-22 ENCOUNTER — Other Ambulatory Visit: Payer: Self-pay

## 2019-09-22 ENCOUNTER — Ambulatory Visit (INDEPENDENT_AMBULATORY_CARE_PROVIDER_SITE_OTHER): Payer: Medicare Other | Admitting: Family Medicine

## 2019-09-22 DIAGNOSIS — Z23 Encounter for immunization: Secondary | ICD-10-CM

## 2019-10-05 ENCOUNTER — Other Ambulatory Visit: Payer: Self-pay

## 2019-10-05 ENCOUNTER — Ambulatory Visit (INDEPENDENT_AMBULATORY_CARE_PROVIDER_SITE_OTHER): Payer: Medicare Other | Admitting: Family Medicine

## 2019-10-05 ENCOUNTER — Encounter: Payer: Self-pay | Admitting: Family Medicine

## 2019-10-05 VITALS — BP 155/74 | HR 109 | Ht 64.0 in | Wt 184.0 lb

## 2019-10-05 DIAGNOSIS — I10 Essential (primary) hypertension: Secondary | ICD-10-CM | POA: Diagnosis not present

## 2019-10-05 DIAGNOSIS — I2510 Atherosclerotic heart disease of native coronary artery without angina pectoris: Secondary | ICD-10-CM | POA: Diagnosis not present

## 2019-10-05 DIAGNOSIS — J3089 Other allergic rhinitis: Secondary | ICD-10-CM

## 2019-10-05 DIAGNOSIS — E785 Hyperlipidemia, unspecified: Secondary | ICD-10-CM

## 2019-10-05 DIAGNOSIS — I252 Old myocardial infarction: Secondary | ICD-10-CM

## 2019-10-05 DIAGNOSIS — Z79899 Other long term (current) drug therapy: Secondary | ICD-10-CM | POA: Diagnosis not present

## 2019-10-05 DIAGNOSIS — E039 Hypothyroidism, unspecified: Secondary | ICD-10-CM

## 2019-10-05 DIAGNOSIS — E1165 Type 2 diabetes mellitus with hyperglycemia: Secondary | ICD-10-CM | POA: Diagnosis not present

## 2019-10-05 DIAGNOSIS — I480 Paroxysmal atrial fibrillation: Secondary | ICD-10-CM | POA: Diagnosis not present

## 2019-10-05 DIAGNOSIS — J4540 Moderate persistent asthma, uncomplicated: Secondary | ICD-10-CM

## 2019-10-05 DIAGNOSIS — M1A09X0 Idiopathic chronic gout, multiple sites, without tophus (tophi): Secondary | ICD-10-CM

## 2019-10-05 DIAGNOSIS — M1A0790 Idiopathic chronic gout, unspecified ankle and foot, without tophus (tophi): Secondary | ICD-10-CM

## 2019-10-05 DIAGNOSIS — E1169 Type 2 diabetes mellitus with other specified complication: Secondary | ICD-10-CM

## 2019-10-05 LAB — POCT GLYCOSYLATED HEMOGLOBIN (HGB A1C): Hemoglobin A1C: 10.8 % — AB (ref 4.0–5.6)

## 2019-10-05 MED ORDER — COLCRYS 0.6 MG PO TABS: 0.6000 mg | ORAL_TABLET | Freq: Every day | ORAL | 0 refills | Status: DC | PRN

## 2019-10-05 MED ORDER — ALLOPURINOL 300 MG PO TABS: 300.0000 mg | ORAL_TABLET | Freq: Two times a day (BID) | ORAL | 1 refills | Status: DC

## 2019-10-05 MED ORDER — MONTELUKAST SODIUM 10 MG PO TABS: 10.0000 mg | ORAL_TABLET | Freq: Every day | ORAL | 3 refills | Status: DC

## 2019-10-05 MED ORDER — LEVOTHYROXINE SODIUM 112 MCG PO TABS: 112.0000 ug | ORAL_TABLET | Freq: Every day | ORAL | 1 refills | Status: DC

## 2019-10-05 MED ORDER — CLOPIDOGREL BISULFATE 75 MG PO TABS: 75.0000 mg | ORAL_TABLET | Freq: Every day | ORAL | 0 refills | Status: DC

## 2019-10-05 MED ORDER — APIXABAN 5 MG PO TABS: 5.0000 mg | ORAL_TABLET | Freq: Two times a day (BID) | ORAL | 1 refills | Status: DC

## 2019-10-05 MED ORDER — ATORVASTATIN CALCIUM 80 MG PO TABS: 80.0000 mg | ORAL_TABLET | Freq: Every day | ORAL | 3 refills | Status: DC

## 2019-10-05 MED ORDER — ATENOLOL 100 MG PO TABS: 100.0000 mg | ORAL_TABLET | Freq: Every day | ORAL | 1 refills | Status: DC

## 2019-10-05 MED ORDER — TRULICITY 0.75 MG/0.5ML ~~LOC~~ SOAJ: 1.5000 mL | SUBCUTANEOUS | 1 refills | Status: DC

## 2019-10-05 MED ORDER — METFORMIN HCL 1000 MG PO TABS: 1000.0000 mg | ORAL_TABLET | Freq: Two times a day (BID) | ORAL | 3 refills | Status: DC

## 2019-10-05 NOTE — Patient Instructions (Addendum)
Increase your Metformin to twice a day.  I sent a new prescription to your mail order to increase your Trulicity to the 1.5 mg weekly.  Try to work on getting increased activity and exercise level.  Stationary bike would be a great option. Plan like to see you back in 6 weeks.  Please bring in your home blood sugars as well as her home blood pressure cuff with you. Okay to quit taking your Colcrys daily.  Just keep it for flares. Talk with Dr. Stanford Breed about whether or not it would be okay to stop your Plavix at this point since it has been 3 years since she had her stents placed. Also talk with Dr. Lake Bells about either combining your medication to Trelegy or either discontinuing your Spiriva since her respiratory symptoms have been better since she started immunotherapy.

## 2019-10-05 NOTE — Assessment & Plan Note (Signed)
Again, recommend discussing possibility of discontinuing Plavix with Dr. Stanford Breed.

## 2019-10-05 NOTE — Assessment & Plan Note (Signed)
Plans on scheduling an upcoming appoint with Dr. Stanford Breed.  Encouraged her to discuss whether or not she needs to continue the Plavix and she has been on it for the last 3 years.  She certainly is higher risk with her diabetes and hypertension but I will leave that up to him whether or not he wants to continue it or not.

## 2019-10-05 NOTE — Assessment & Plan Note (Signed)
She is actually done really well over the last year.  Will discontinue daily Colcrys and just use as needed for inflammation/rescue.  This should be a good cost savings.  In addition if her uric acid level looks great we might even be able to decrease her allopurinol down to 300 mg once a day instead of twice a day.

## 2019-10-05 NOTE — Assessment & Plan Note (Signed)
Due to recheck TSH.  Just make sure that things well regulated especially with recent weight loss.

## 2019-10-05 NOTE — Assessment & Plan Note (Signed)
Discussed that her A1c is uncontrolled today and that is actually the likely cause of her weight loss.  She feels like overall she does a good job with her diet and feels like she has made some positive changes she has not been exercising because of hip problems so we discussed using stationary bike.  Will increase Trulicity to 1.5 mg and have her increase her Metformin to twice a day.  Plan to follow-up in 6 weeks and have her bring in her glucometer/blood sugars.

## 2019-10-05 NOTE — Assessment & Plan Note (Signed)
She reports that her blood pressures at home are great in fact sometimes run a little on the low end.  Today it was elevated.  Just encouraged her to bring in her home blood pressure cuff at the next visit in 6 weeks so that we can see if these are accurate.

## 2019-10-05 NOTE — Progress Notes (Signed)
Established Patient Office Visit  Subjective:  Patient ID: Tara Campbell, female    DOB: 1946/05/16  Age: 73 y.o. MRN: [id number]  CC:  Chief Complaint  Patient presents with  . Diabetes  . Hypertension    HPI Tara Campbell presents for medication review.  She wanted to get through her medications to see if there are any that could be changed, updated or discontinued.  She is a 73 year old female with a history of coronary artery disease, hypertension, atrial fibrillation, asthma, and diabetes as well as hypothyroidism.  Diabetes-her last A1c was over a year ago.  She had actually lost some weight recently so came in today hoping that she might be able to come off of some of her diabetic medications which are quite expensive.  She is currently on Metformin which she only takes once a day that the prescription says twice a day.  She is also on Jardiance as well as Trulicity.  Hypothyroidism-currently on levothyroxine 112 mcg.  Taking medication regularly.  Due to recheck levels. Lab Results  Component Value Date   TSH 3.75 08/05/2018   Hypertension- Pt denies chest pain, SOB, dizziness, or heart palpitations.  Taking meds as directed w/o problems.  Denies medication side effects.    Atrial fibrillation-currently on Eliquis 5 mg daily.  A. fib initially started during an acute illness and she has flipped in and out a few times since then.  The last time that she was aware she was actually in atrial fibrillation was about a year ago.  Gout-currently on allopurinol.  He says she has not had a flare in most a year.  She takes her colchicine daily and has for most the last 2 years.  CAD - she had 2 stents placed in 2017.  These were placed at the same time and she has continued Plavix since then.  She is not on aspirin but is on Eliquis for her atrial fibrillation.  Past Medical History:  Diagnosis Date  . Allergy   . Asthma   . Colon polyps   . Diabetes mellitus without  complication ([place])   . Gout   . Heart attack ([place]) 07/30/2016   PCI, 2 stents  . Hyperlipidemia   . Hypertension   . Internal hemorrhoids   . Left rotator cuff tear   . 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 Otomycosis of right ear 09/27/2017  . Paroxysmal atrial fibrillation ([facility])   . Thyroid disease     Past Surgical History:  Procedure Laterality Date  . ANGIOPLASTY    . COLONOSCOPY W/ POLYPECTOMY    . HEMORRHOID BANDING      Family History  Problem Relation Age of Onset  . Hypertension Mother   . Hyperlipidemia Mother   . Glaucoma Father   . Heart disease Father   . Hypertension Father   . Diabetes Father   . Hyperlipidemia Father   . Skin cancer Father   . Diabetes Paternal Grandmother     Social History   Socioeconomic History  . Marital status: Divorced    Spouse name: Not on file  . Number of children: 2  . Years of education: Not on file  . Highest education level: Not on file  Occupational History  . Not on file  Social Needs  . Financial resource strain: Not on file  . Food insecurity    Worry: Not on file    Inability: Not on file  . Transportation needs    Medical:  Not on file    Non-medical: Not on file  Tobacco Use  . Smoking status: Never Smoker  . Smokeless tobacco: Never Used  Substance and Sexual Activity  . Alcohol use: Yes    Comment: Rare  . Drug use: No  . Sexual activity: Not Currently  Lifestyle  . Physical activity    Days per week: Not on file    Minutes per session: Not on file  . Stress: Not on file  Relationships  . Social Herbalist on phone: Not on file    Gets together: Not on file    Attends religious service: Not on file    Active member of club or organization: Not on file    Attends meetings of clubs or organizations: Not on file    Relationship status: Not on file  . Intimate partner violence    Fear of current or ex partner: Not on file    Emotionally abused: Not on file    Physically abused: Not on  file    Forced sexual activity: Not on file  Other Topics Concern  . Not on file  Social History Narrative  . Not on file    Outpatient Medications Prior to Visit  Medication Sig Dispense Refill  . albuterol (PROVENTIL HFA;VENTOLIN HFA) 108 (90 Base) MCG/ACT inhaler Inhale 2 puffs into the lungs every 6 (six) hours as needed for wheezing. 2 Inhaler 1  . BREO ELLIPTA 200-25 MCG/INH AEPB Inhale 1 puff into the lungs daily. 60 each 1  . fluticasone (FLONASE) 50 MCG/ACT nasal spray Place 1 spray into both nostrils daily. 16 g 3  . ipratropium-albuterol (DUONEB) 0.5-2.5 (3) MG/3ML SOLN Take 3 mLs by nebulization every 4 (four) hours as needed. 3 mL 0  . irbesartan-hydrochlorothiazide (AVALIDE) 300-12.5 MG tablet Take 1 tablet by mouth daily. 90 tablet 1  . levalbuterol (XOPENEX HFA) 45 MCG/ACT inhaler Inhale 1-2 puffs into the lungs every 6 (six) hours as needed for wheezing or shortness of breath.    . levocetirizine (XYZAL ALLERGY 24HR) 5 MG tablet Take 1 tablet (5 mg total) by mouth every evening. 90 tablet 3  . SPIRIVA RESPIMAT 2.5 MCG/ACT AERS Inhale 2 puffs into the lungs daily. 4 g 5  . allopurinol (ZYLOPRIM) 300 MG tablet Take 1 tablet (300 mg total) by mouth 2 (two) times daily. 180 tablet 3  . apixaban (ELIQUIS) 5 MG TABS tablet Take 1 tablet (5 mg total) by mouth 2 (two) times daily. 60 tablet 2  . atenolol (TENORMIN) 100 MG tablet Take 1 tablet (100 mg total) by mouth daily. 90 tablet 1  . atorvastatin (LIPITOR) 80 MG tablet Take 1 tablet (80 mg total) by mouth at bedtime. 90 tablet 1  . clopidogrel (PLAVIX) 75 MG tablet TAKE 1 TABLET BY MOUTH  DAILY 90 tablet 1  . COLCRYS 0.6 MG tablet Take 1 tablet (0.6 mg total) by mouth daily. 30 tablet 3  . Dulaglutide (TRULICITY) 7.97 KQ/2.0UO SOPN Inject 0.5 mLs into the skin once a week. 4 pen 11  . levothyroxine (SYNTHROID, LEVOTHROID) 112 MCG tablet Take 1 tablet (112 mcg total) by mouth daily before breakfast. 90 tablet 1  . metFORMIN  (GLUCOPHAGE) 1000 MG tablet Take 1 tablet (1,000 mg total) by mouth 2 (two) times daily with a meal. 180 tablet 3  . montelukast (SINGULAIR) 10 MG tablet Take 1 tablet (10 mg total) by mouth at bedtime. 90 tablet 3  . blood glucose meter kit and  supplies KIT Check morning fasting blood glucose daily and up to 4 times daily as directed 1 each 0  . Cholecalciferol (VITAMIN D3 PO) Take 5,000 mg by mouth daily.    . Cyanocobalamin (B-12 PO) Take 1,000 mg by mouth daily.    . nitroGLYCERIN (NITROSTAT) 0.4 MG SL tablet Place 1 tablet (0.4 mg total) under the tongue every 5 (five) minutes as needed for chest pain (or tightness). 30 tablet 0  . Omega-3 Fatty Acids (FISH OIL PO) Take 5,000 Units by mouth daily.    . diphenhydrAMINE (BENADRYL) 25 MG tablet Take 1 tablet (25 mg total) by mouth 3 (three) times daily.    Marland Kitchen NEBULIZER SYSTEM ALL-IN-ONE MISC 1 Device by Does not apply route every 4 (four) hours as needed. 1 each 0  . predniSONE (STERAPRED UNI-PAK 48 TAB) 10 MG (48) TBPK tablet Take by mouth daily. Use as directed for taper 1 tablet 0   No facility-administered medications prior to visit.     Allergies  Allergen Reactions  . Latex   . Sulfa Antibiotics Rash    ROS Review of Systems    Objective:    Physical Exam  Constitutional: She is oriented to person, place, and time. She appears well-developed and well-nourished.  HENT:  Head: Normocephalic and atraumatic.  Right Ear: External ear normal.  Left Ear: External ear normal.  Eyes: Conjunctivae are normal.  Neck: Neck supple. No thyromegaly present.  Cardiovascular: Normal rate, regular rhythm and normal heart sounds.  Pulmonary/Chest: Effort normal and breath sounds normal.  Lymphadenopathy:    She has no cervical adenopathy.  Neurological: She is alert and oriented to person, place, and time.  Skin: Skin is warm and dry.  Psychiatric: She has a normal mood and affect. Her behavior is normal.    BP (!) 155/74   Pulse (!)  109   Ht '5\' 4"'$  (1.626 m)   Wt 184 lb (83.5 kg)   SpO2 97%   BMI 31.58 kg/m  Wt Readings from Last 3 Encounters:  10/05/19 184 lb (83.5 kg)  08/24/19 190 lb (86.2 kg)  08/22/19 190 lb (86.2 kg)     Health Maintenance Due  Topic Date Due  . FOOT EXAM  11/28/2018  . HEMOGLOBIN A1C  01/24/2019    There are no preventive care reminders to display for this patient.  Lab Results  Component Value Date   TSH 3.75 08/05/2018   Lab Results  Component Value Date   WBC 7.7 08/05/2018   HGB 12.8 08/05/2018   HCT 37.2 08/05/2018   MCV 85.5 08/05/2018   PLT 330 08/05/2018   Lab Results  Component Value Date   NA 139 08/05/2018   K 4.1 08/05/2018   CO2 24 08/05/2018   GLUCOSE 93 08/05/2018   BUN 15 08/05/2018   CREATININE 1.04 (H) 08/05/2018   BILITOT 0.5 08/05/2018   ALKPHOS 100 05/29/2017   AST 16 08/05/2018   ALT 18 08/05/2018   PROT 6.9 08/05/2018   ALBUMIN 4.2 05/29/2017   CALCIUM 10.3 08/05/2018   ANIONGAP 12 03/07/2018   Lab Results  Component Value Date   CHOL 183 08/22/2017   Lab Results  Component Value Date   HDL 43 (L) 08/22/2017   Lab Results  Component Value Date   LDLCALC 109 (H) 08/22/2017   Lab Results  Component Value Date   TRIG 190 (H) 08/22/2017   Lab Results  Component Value Date   CHOLHDL 4.3 08/22/2017   Lab Results  Component Value Date   HGBA1C 10.8 (A) 10/05/2019      Assessment & Plan:   Problem List Items Addressed This Visit      Cardiovascular and Mediastinum   Paroxysmal atrial fibrillation ([place])    Plans on scheduling an upcoming appoint with Dr. Stanford Breed.  Encouraged her to discuss whether or not she needs to continue the Plavix and she has been on it for the last 3 years.  She certainly is higher risk with her diabetes and hypertension but I will leave that up to him whether or not he wants to continue it or not.      Relevant Medications   apixaban (ELIQUIS) 5 MG TABS tablet   atenolol (TENORMIN) 100 MG tablet    atorvastatin (LIPITOR) 80 MG tablet   Other Relevant Orders   Lipid Panel w/reflex Direct LDL   COMPLETE METABOLIC PANEL WITH GFR   Hypertension goal BP (blood pressure) < 140/90    She reports that her blood pressures at home are great in fact sometimes run a little on the low end.  Today it was elevated.  Just encouraged her to bring in her home blood pressure cuff at the next visit in 6 weeks so that we can see if these are accurate.      Relevant Medications   apixaban (ELIQUIS) 5 MG TABS tablet   atenolol (TENORMIN) 100 MG tablet   atorvastatin (LIPITOR) 80 MG tablet   Other Relevant Orders   Lipid Panel w/reflex Direct LDL   COMPLETE METABOLIC PANEL WITH GFR   Coronary artery disease involving native coronary artery of native heart without angina pectoris    Again, recommend discussing possibility of discontinuing Plavix with Dr. Stanford Breed.      Relevant Medications   apixaban (ELIQUIS) 5 MG TABS tablet   atenolol (TENORMIN) 100 MG tablet   atorvastatin (LIPITOR) 80 MG tablet   clopidogrel (PLAVIX) 75 MG tablet   Other Relevant Orders   COMPLETE METABOLIC PANEL WITH GFR     Respiratory   Non-seasonal allergic rhinitis   Relevant Medications   montelukast (SINGULAIR) 10 MG tablet   Moderate persistent asthma    Currently doing well after his recent start of immunotherapy.  Has a follow-up coming up with Dr. Lake Bells to discuss whether or not she might be able to discontinue her Spiriva or potentially switch to Trelegy.  Currently she is spending $94 a month total on her Spiriva and Breo combined so if the Trelegy is less than that then it would be a cost savings.      Relevant Medications   levalbuterol (XOPENEX HFA) 45 MCG/ACT inhaler   montelukast (SINGULAIR) 10 MG tablet     Endocrine   Uncontrolled type 2 diabetes mellitus with hyperglycemia ([place]) - Primary    Discussed that her A1c is uncontrolled today and that is actually the likely cause of her weight loss.  She  feels like overall she does a good job with her diet and feels like she has made some positive changes she has not been exercising because of hip problems so we discussed using stationary bike.  Will increase Trulicity to 1.5 mg and have her increase her Metformin to twice a day.  Plan to follow-up in 6 weeks and have her bring in her glucometer/blood sugars.      Relevant Medications   atorvastatin (LIPITOR) 80 MG tablet   metFORMIN (GLUCOPHAGE) 1000 MG tablet   Dulaglutide (TRULICITY) 8.54 OE/7.0JJ SOPN   Other Relevant  Orders   POCT glycosylated hemoglobin (Hb A1C) (Completed)   Acquired hypothyroidism    Due to recheck TSH.  Just make sure that things well regulated especially with recent weight loss.      Relevant Medications   atenolol (TENORMIN) 100 MG tablet   levothyroxine (SYNTHROID) 112 MCG tablet   Other Relevant Orders   TSH     Musculoskeletal and Integument   Gout of ankle    She is actually done really well over the last year.  Will discontinue daily Colcrys and just use as needed for inflammation/rescue.  This should be a good cost savings.  In addition if her uric acid level looks great we might even be able to decrease her allopurinol down to 300 mg once a day instead of twice a day.      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 allopurinol (ZYLOPRIM) 300 MG tablet    Sig: Take 1 tablet (300 mg total) by mouth 2 (two) times daily.    Dispense:  180 tablet    Refill:  1  . apixaban (ELIQUIS) 5 MG TABS tablet    Sig: Take 1 tablet (5 mg total) by mouth 2 (two) times daily.    Dispense:  180 tablet    Refill:  1  . atenolol (TENORMIN) 100 MG tablet    Sig: Take 1 tablet (100 mg total) by mouth daily.    Dispense:  90 tablet    Refill:  1  . atorvastatin (LIPITOR) 80 MG tablet    Sig: Take 1 tablet (80 mg total) by mouth at bedtime.    Dispense:  90 tablet    Refill:  3  . clopidogrel (PLAVIX) 75 MG tablet    Sig: Take 1 tablet (75 mg total) by mouth daily.    Dispense:  90 tablet    Refill:  0  . COLCRYS 0.6 MG tablet    Sig: Take 1 tablet (0.6 mg total) by mouth daily as needed.    Dispense:  90 tablet    Refill:  0    Brand name only.  Marland Kitchen levothyroxine (SYNTHROID) 112 MCG tablet    Sig: Take 1 tablet (112 mcg total) by mouth daily before breakfast.    Dispense:  90 tablet    Refill:  1  . metFORMIN (GLUCOPHAGE) 1000 MG tablet    Sig: Take 1 tablet (1,000 mg total) by mouth 2 (two) times daily with a meal.    Dispense:  180 tablet    Refill:  3  . montelukast (SINGULAIR) 10 MG tablet    Sig: Take 1 tablet (10 mg total) by mouth at  bedtime.    Dispense:  90 tablet    Refill:  3  . Dulaglutide (TRULICITY) 6.38 GT/3.6IW SOPN    Sig: Inject 1.5 mLs into the skin once a week.    Dispense:  12 pen    Refill:  1    Follow-up: Return in about 6 weeks (around 11/16/2019) for Diabetes follow-up.   Time spent 40 minutes, greater than 50% of time for face-to-face counseling about hypothyroidism, diabetes, asthma, atrial fibrillation, coronary artery disease, and gout.  Beatrice Lecher, MD

## 2019-10-05 NOTE — Assessment & Plan Note (Signed)
Currently doing well after his recent start of immunotherapy.  Has a follow-up coming up with Dr. Lake Bells to discuss whether or not she might be able to discontinue her Spiriva or potentially switch to Trelegy.  Currently she is spending $94 a month total on her Spiriva and Breo combined so if the Trelegy is less than that then it would be a cost savings.

## 2019-10-09 DIAGNOSIS — J309 Allergic rhinitis, unspecified: Secondary | ICD-10-CM | POA: Diagnosis not present

## 2019-10-09 DIAGNOSIS — J329 Chronic sinusitis, unspecified: Secondary | ICD-10-CM | POA: Diagnosis not present

## 2019-10-09 DIAGNOSIS — I480 Paroxysmal atrial fibrillation: Secondary | ICD-10-CM | POA: Diagnosis not present

## 2019-10-09 DIAGNOSIS — I2510 Atherosclerotic heart disease of native coronary artery without angina pectoris: Secondary | ICD-10-CM | POA: Diagnosis not present

## 2019-10-09 DIAGNOSIS — I10 Essential (primary) hypertension: Secondary | ICD-10-CM | POA: Diagnosis not present

## 2019-10-09 DIAGNOSIS — M1A0790 Idiopathic chronic gout, unspecified ankle and foot, without tophus (tophi): Secondary | ICD-10-CM | POA: Diagnosis not present

## 2019-10-09 DIAGNOSIS — J342 Deviated nasal septum: Secondary | ICD-10-CM | POA: Diagnosis not present

## 2019-10-09 DIAGNOSIS — E039 Hypothyroidism, unspecified: Secondary | ICD-10-CM | POA: Diagnosis not present

## 2019-10-10 LAB — LIPID PANEL W/REFLEX DIRECT LDL
Cholesterol: 290 mg/dL — ABNORMAL HIGH (ref <200)
HDL: 49 mg/dL — ABNORMAL LOW (ref >=50)
LDL Cholesterol (Calc): 186 mg/dL (calc) — ABNORMAL HIGH
Non-HDL Cholesterol (Calc): 241 mg/dL (calc) — ABNORMAL HIGH (ref <130)
Total CHOL/HDL Ratio: 5.9 (calc) — ABNORMAL HIGH (ref <5.0)
Triglycerides: 327 mg/dL — ABNORMAL HIGH (ref <150)

## 2019-10-10 LAB — COMPLETE METABOLIC PANEL WITH GFR
AG Ratio: 2 (calc) (ref 1.0–2.5)
ALT: 18 U/L (ref 6–29)
AST: 18 U/L (ref 10–35)
Albumin: 4.4 g/dL (ref 3.6–5.1)
Alkaline phosphatase (APISO): 111 U/L (ref 37–153)
BUN: 11 mg/dL (ref 7–25)
CO2: 24 mmol/L (ref 20–32)
Calcium: 10.3 mg/dL (ref 8.6–10.4)
Chloride: 101 mmol/L (ref 98–110)
Creat: 0.84 mg/dL (ref 0.60–0.93)
GFR, Est African American: 79 mL/min/{1.73_m2} (ref >=60)
GFR, Est Non African American: 68 mL/min/{1.73_m2} (ref >=60)
Globulin: 2.2 g/dL (calc) (ref 1.9–3.7)
Glucose, Bld: 235 mg/dL — ABNORMAL HIGH (ref 65–99)
Potassium: 4.2 mmol/L (ref 3.5–5.3)
Sodium: 137 mmol/L (ref 135–146)
Total Bilirubin: 0.6 mg/dL (ref 0.2–1.2)
Total Protein: 6.6 g/dL (ref 6.1–8.1)

## 2019-10-10 LAB — TSH: TSH: 3.87 mIU/L (ref 0.40–4.50)

## 2019-10-10 LAB — URIC ACID: Uric Acid, Serum: 6.9 mg/dL (ref 2.5–7.0)

## 2019-10-30 ENCOUNTER — Other Ambulatory Visit: Payer: Self-pay | Admitting: Family Medicine

## 2019-10-30 DIAGNOSIS — M1A09X0 Idiopathic chronic gout, multiple sites, without tophus (tophi): Secondary | ICD-10-CM

## 2019-11-16 ENCOUNTER — Other Ambulatory Visit: Payer: Self-pay

## 2019-11-16 ENCOUNTER — Encounter: Payer: Self-pay | Admitting: Family Medicine

## 2019-11-16 ENCOUNTER — Ambulatory Visit (INDEPENDENT_AMBULATORY_CARE_PROVIDER_SITE_OTHER): Payer: Medicare Other | Admitting: Family Medicine

## 2019-11-16 VITALS — BP 138/86 | HR 104 | Ht 64.0 in | Wt 182.0 lb

## 2019-11-16 DIAGNOSIS — N760 Acute vaginitis: Secondary | ICD-10-CM | POA: Diagnosis not present

## 2019-11-16 DIAGNOSIS — R05 Cough: Secondary | ICD-10-CM

## 2019-11-16 DIAGNOSIS — E1165 Type 2 diabetes mellitus with hyperglycemia: Secondary | ICD-10-CM | POA: Diagnosis not present

## 2019-11-16 DIAGNOSIS — Z23 Encounter for immunization: Secondary | ICD-10-CM | POA: Diagnosis not present

## 2019-11-16 DIAGNOSIS — R059 Cough, unspecified: Secondary | ICD-10-CM

## 2019-11-16 DIAGNOSIS — J4540 Moderate persistent asthma, uncomplicated: Secondary | ICD-10-CM | POA: Diagnosis not present

## 2019-11-16 LAB — WET PREP FOR TRICH, YEAST, CLUE
MICRO NUMBER:: 1194458
Specimen Quality: ADEQUATE

## 2019-11-16 LAB — POCT GLYCOSYLATED HEMOGLOBIN (HGB A1C): Hemoglobin A1C: 10.4 % — AB (ref 4.0–5.6)

## 2019-11-16 MED ORDER — FLUCONAZOLE 150 MG PO TABS: 150.0000 mg | ORAL_TABLET | ORAL | 1 refills | Status: DC

## 2019-11-16 MED ORDER — GLUCOSE BLOOD VI STRP: ORAL_STRIP | 11 refills | Status: DC

## 2019-11-16 MED ORDER — ALBUTEROL SULFATE HFA 108 (90 BASE) MCG/ACT IN AERS: 2.0000 | INHALATION_SPRAY | Freq: Four times a day (QID) | RESPIRATORY_TRACT | 99 refills | Status: DC | PRN

## 2019-11-16 NOTE — Patient Instructions (Addendum)
Try checking your blood sugar twice a week first thing in the morning before breakfast.  You can bring that log in with you at your next visit in 6 weeks.  Continue to work on healthy food choices and cutting back on carbohydrates such as bread, rice, pasta, crackers etc.  Also avoid all sugary beverages.  Really try to increase her vegetables and lean proteins.  Goal blood sugar is less than 130 in the morning before breakfast.

## 2019-11-16 NOTE — Assessment & Plan Note (Signed)
She is not actually checking her blood sugars currently but encouraged her to do so.  At least twice a week and bring those in with her either on her meter or written down.  In the meantime continue to work on healthy food choices and trying to really stay active and exercise.  Tolerating the increase of the Trulicity and the Metformin well.  Do suspect that her Vania Rea could be actually increasing her risk for genital infections.

## 2019-11-16 NOTE — Assessment & Plan Note (Signed)
She does have some mild expiratory wheeze on exam.  Just encouraged her to use her albuterol nebs more frequently over the next couple days.  If she feels like she is getting worse then let us know and we can always send in a prescription for prednisone and possibly antibiotics.

## 2019-11-16 NOTE — Progress Notes (Signed)
Established Patient Office Visit  Subjective:  Patient ID: Tara Campbell, female    DOB: 11/03/1946  Age: 73 y.o. MRN: [id number]  CC:  Chief Complaint  Patient presents with  . Diabetes    HPI [name] presents for uncontrolled diabetes.  This is a 6-week follow-up since her A1c was actually greater than 10.  Initially she had lost some weight and thought maybe she had done better with her blood sugars but I think it was from ketosis.  When I saw her we decided to increase her Trulicity to 1.5 mg and also increase her Metformin to twice a day and then see her back.  She already made some good changes in regards to her diet.  So far she has been tolerating the increased dose on the Trulicity and the Metformin really well.  She has not been checking her blood sugars.  She also feels like she has been battling a yeast infection since she was put on antibiotics in September she was pretty sick at the time and was on a couple rounds of antibiotics.  She just feels like she has not quite gotten rid of it she says she will do an over-the-counter treatment and get relief for maybe about 5 days and then will feel like her symptoms start to come back.  She is also on Jardiance which increases her risk for gynecologic infections.  Asthma-she is also noted she has had increased cough and wheezing with activities for about the last 5 days.  No fevers or chills or sweats.  She has had a lot of sinus drainage.  She wants to know she should maybe restart her nasal saline rinses.  Also had some questions about the Covid vaccine.  She is very interested in getting it.  She would also like to get her Prevnar 13 today as well.  Past Medical History:  Diagnosis Date  . Allergy   . Asthma   . Colon polyps   . Diabetes mellitus without complication ([place])   . Gout   . Heart attack ([place]) 07/30/2016   PCI, 2 stents  . Hyperlipidemia   . Hypertension   . Internal hemorrhoids   . Left rotator cuff  tear   . 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 Otomycosis of right ear 09/27/2017  . Paroxysmal atrial fibrillation ([facility])   . Thyroid disease     Past Surgical History:  Procedure Laterality Date  . ANGIOPLASTY    . COLONOSCOPY W/ POLYPECTOMY    . HEMORRHOID BANDING      Family History  Problem Relation Age of Onset  . Hypertension Mother   . Hyperlipidemia Mother   . Glaucoma Father   . Heart disease Father   . Hypertension Father   . Diabetes Father   . Hyperlipidemia Father   . Skin cancer Father   . Diabetes Paternal Grandmother     Social History   Socioeconomic History  . Marital status: Divorced    Spouse name: Not on file  . Number of children: 2  . Years of education: Not on file  . Highest education level: Not on file  Occupational History  . Not on file  Tobacco Use  . Smoking status: Never Smoker  . Smokeless tobacco: Never Used  Substance and Sexual Activity  . Alcohol use: Yes    Comment: Rare  . Drug use: No  . Sexual activity: Not Currently  Other Topics Concern  . Not on file  Social History Narrative  . Not on file   Social Determinants of Health   Financial Resource Strain:   . Difficulty of Paying Living Expenses: Not on file  Food Insecurity:   . Worried About Charity fundraiser in the Last Year: Not on file  . Ran Out of Food in the Last Year: Not on file  Transportation Needs:   . Lack of Transportation (Medical): Not on file  . Lack of Transportation (Non-Medical): Not on file  Physical Activity:   . Days of Exercise per Week: Not on file  . Minutes of Exercise per Session: Not on file  Stress:   . Feeling of Stress : Not on file  Social Connections:   . Frequency of Communication with Friends and Family: Not on file  . Frequency of Social Gatherings with Friends and Family: Not on file  . Attends Religious Services: Not on file  . Active Member of Clubs or Organizations: Not on file  . Attends Archivist Meetings:  Not on file  . Marital Status: Not on file  Intimate Partner Violence:   . Fear of Current or Ex-Partner: Not on file  . Emotionally Abused: Not on file  . Physically Abused: Not on file  . Sexually Abused: Not on file    Outpatient Medications Prior to Visit  Medication Sig Dispense Refill  . allopurinol (ZYLOPRIM) 300 MG tablet Take 1 tablet (300 mg total) by mouth 2 (two) times daily. 180 tablet 1  . apixaban (ELIQUIS) 5 MG TABS tablet Take 1 tablet (5 mg total) by mouth 2 (two) times daily. 180 tablet 1  . atenolol (TENORMIN) 100 MG tablet Take 1 tablet (100 mg total) by mouth daily. 90 tablet 1  . atorvastatin (LIPITOR) 80 MG tablet Take 1 tablet (80 mg total) by mouth at bedtime. 90 tablet 3  . blood glucose meter kit and supplies KIT Check morning fasting blood glucose daily and up to 4 times daily as directed 1 each 0  . BREO ELLIPTA 200-25 MCG/INH AEPB Inhale 1 puff into the lungs daily. 60 each 1  . Cholecalciferol (VITAMIN D3 PO) Take 5,000 mg by mouth daily.    . clopidogrel (PLAVIX) 75 MG tablet Take 1 tablet (75 mg total) by mouth daily. 90 tablet 0  . COLCRYS 0.6 MG tablet TAKE 1 TABLET BY MOUTH  DAILY AS NEEDED 30 tablet 11  . Cyanocobalamin (B-12 PO) Take 1,000 mg by mouth daily.    . Dulaglutide (TRULICITY) 7.41 SE/3.9RV SOPN Inject 1.5 mLs into the skin once a week. 12 pen 1  . fluticasone (FLONASE) 50 MCG/ACT nasal spray Place 1 spray into both nostrils daily. 16 g 3  . ipratropium-albuterol (DUONEB) 0.5-2.5 (3) MG/3ML SOLN Take 3 mLs by nebulization every 4 (four) hours as needed. 3 mL 0  . irbesartan-hydrochlorothiazide (AVALIDE) 300-12.5 MG tablet Take 1 tablet by mouth daily. 90 tablet 1  . levalbuterol (XOPENEX HFA) 45 MCG/ACT inhaler Inhale 1-2 puffs into the lungs every 6 (six) hours as needed for wheezing or shortness of breath.    . levocetirizine (XYZAL ALLERGY 24HR) 5 MG tablet Take 1 tablet (5 mg total) by mouth every evening. 90 tablet 3  . levothyroxine  (SYNTHROID) 112 MCG tablet Take 1 tablet (112 mcg total) by mouth daily before breakfast. 90 tablet 1  . metFORMIN (GLUCOPHAGE) 1000 MG tablet Take 1 tablet (1,000 mg total) by mouth 2 (two) times daily with a meal. 180 tablet 3  .  montelukast (SINGULAIR) 10 MG tablet Take 1 tablet (10 mg total) by mouth at bedtime. 90 tablet 3  . nitroGLYCERIN (NITROSTAT) 0.4 MG SL tablet Place 1 tablet (0.4 mg total) under the tongue every 5 (five) minutes as needed for chest pain (or tightness). 30 tablet 0  . Omega-3 Fatty Acids (FISH OIL PO) Take 5,000 Units by mouth daily.    Marland Kitchen SPIRIVA RESPIMAT 2.5 MCG/ACT AERS Inhale 2 puffs into the lungs daily. 4 g 5  . albuterol (PROVENTIL HFA;VENTOLIN HFA) 108 (90 Base) MCG/ACT inhaler Inhale 2 puffs into the lungs every 6 (six) hours as needed for wheezing. 2 Inhaler 1  . glucose blood test strip 1 each by Other route as needed for other. Use as instructed     No facility-administered medications prior to visit.    Allergies  Allergen Reactions  . Latex   . Sulfa Antibiotics Rash    ROS Review of Systems    Objective:    Physical Exam  Constitutional: She is oriented to person, place, and time. She appears well-developed and well-nourished.  HENT:  Head: Normocephalic and atraumatic.  Right Ear: External ear normal.  Left Ear: External ear normal.  Eyes: Conjunctivae are normal.  Cardiovascular: Normal rate, regular rhythm and normal heart sounds.  Pulmonary/Chest: Effort normal. She has wheezes.  Mild expiratory wheezes that cleared with cough.  No crackles or rhonchi.  Abdominal:  Slight expiratory wheeze laterally.  Improved with cough.  Neurological: She is alert and oriented to person, place, and time.  Skin: Skin is warm and dry.  Psychiatric: She has a normal mood and affect. Her behavior is normal.    BP 138/86 (Cuff Size: Normal)   Pulse (!) 104   Ht _0  (1.626 m)   Wt 182 lb (82.6 kg)   SpO2 98%   BMI 31.24 kg/m  Wt Readings  from Last 3 Encounters:  11/16/19 182 lb (82.6 kg)  10/05/19 184 lb (83.5 kg)  08/24/19 190 lb (86.2 kg)     There are no preventive care reminders to display for this patient.  There are no preventive care reminders to display for this patient.  Lab Results  Component Value Date   TSH 3.87 10/09/2019   Lab Results  Component Value Date   WBC 7.7 08/05/2018   HGB 12.8 08/05/2018   HCT 37.2 08/05/2018   MCV 85.5 08/05/2018   PLT 330 08/05/2018   Lab Results  Component Value Date   NA 137 10/09/2019   K 4.2 10/09/2019   CO2 24 10/09/2019   GLUCOSE 235 (H) 10/09/2019   BUN 11 10/09/2019   CREATININE 0.84 10/09/2019   BILITOT 0.6 10/09/2019   ALKPHOS 100 05/29/2017   AST 18 10/09/2019   ALT 18 10/09/2019   PROT 6.6 10/09/2019   ALBUMIN 4.2 05/29/2017   CALCIUM 10.3 10/09/2019   ANIONGAP 12 03/07/2018   Lab Results  Component Value Date   CHOL 290 (H) 10/09/2019   Lab Results  Component Value Date   HDL 49 (L) 10/09/2019   Lab Results  Component Value Date   LDLCALC 186 (H) 10/09/2019   Lab Results  Component Value Date   TRIG 327 (H) 10/09/2019   Lab Results  Component Value Date   CHOLHDL 5.9 (H) 10/09/2019   Lab Results  Component Value Date   HGBA1C 10.4 (A) 11/16/2019      Assessment & Plan:   Problem List Items Addressed This Visit  Respiratory   Moderate persistent asthma    She does have some mild expiratory wheeze on exam.  Just encouraged her to use her albuterol nebs more frequently over the next couple days.  If she feels like she is getting worse then let us know and we can always send in a prescription for prednisone and possibly antibiotics.      Relevant Medications   albuterol (VENTOLIN HFA) 108 (90 Base) MCG/ACT inhaler     Endocrine   Uncontrolled type 2 diabetes mellitus with hyperglycemia ([place]) - Primary    She is not actually checking her blood sugars currently but encouraged her to do so.  At least twice a week and  bring those in with her either on her meter or written down.  In the meantime continue to work on healthy food choices and trying to really stay active and exercise.  Tolerating the increase of the Trulicity and the Metformin well.  Do suspect that her Vania Rea could be actually increasing her risk for genital infections.      Relevant Medications   glucose blood test strip   Other Relevant Orders   POCT HgB A1C (Completed)    Other Visit Diagnoses    Acute vaginitis       Relevant Orders   WET PREP FOR [place], YEAST, CLUE (Completed)   Cough       Need for prophylactic vaccination against Streptococcus pneumoniae (pneumococcus)       Relevant Orders   Pneumococcal conjugate vaccine 13-valent (Completed)     Acute vaginitis-wet prep performed.  We will call the results once available.  I did go ahead and write a prescription for Diflucan to take 1 every other day for 3 doses to see if we can get this under control.  Also consider could be a side effect of the Jardiance.  Meds ordered this encounter  Medications  . albuterol (VENTOLIN HFA) 108 (90 Base) MCG/ACT inhaler    Sig: Inhale 2 puffs into the lungs every 6 (six) hours as needed for wheezing.    Dispense:  18 g    Refill:  prn    Please use generic pro-air  . glucose blood test strip    Sig: To be used twice daily for testing blood sugars. E11.65    Dispense:  200 each    Refill:  11  . fluconazole (DIFLUCAN) 150 MG tablet    Sig: Take 1 tablet (150 mg total) by mouth every other day.    Dispense:  3 tablet    Refill:  1    Follow-up: Return in about 6 weeks (around 12/28/2019) for Diabetes follow-up.    Beatrice Lecher, MD

## 2019-12-25 ENCOUNTER — Other Ambulatory Visit: Payer: Self-pay | Admitting: Family Medicine

## 2019-12-25 DIAGNOSIS — I252 Old myocardial infarction: Secondary | ICD-10-CM

## 2019-12-25 DIAGNOSIS — I2510 Atherosclerotic heart disease of native coronary artery without angina pectoris: Secondary | ICD-10-CM

## 2019-12-28 ENCOUNTER — Ambulatory Visit: Payer: Medicare Other | Admitting: Family Medicine

## 2020-01-25 ENCOUNTER — Encounter: Payer: Self-pay | Admitting: Family Medicine

## 2020-01-25 DIAGNOSIS — J4541 Moderate persistent asthma with (acute) exacerbation: Secondary | ICD-10-CM

## 2020-01-25 MED ORDER — IPRATROPIUM-ALBUTEROL 0.5-2.5 (3) MG/3ML IN SOLN: 3.0000 mL | Freq: Four times a day (QID) | RESPIRATORY_TRACT | 99 refills | Status: DC | PRN

## 2020-01-25 NOTE — Telephone Encounter (Signed)
Hi  Rachel. Can you clarify. If she on on Spiriva daily then she really doesn't need the duoeneb.  She just needs plain albuterol in her neb.  I already sent it before I saw the Spiriva.

## 2020-01-25 NOTE — Telephone Encounter (Signed)
Proair refills at pharmacy, but ipratropium has not been filled since 2019.  Please advise if ok for refill

## 2020-01-26 ENCOUNTER — Telehealth: Payer: Self-pay

## 2020-01-26 MED ORDER — ALBUTEROL SULFATE (2.5 MG/3ML) 0.083% IN NEBU: 2.5000 mg | INHALATION_SOLUTION | Freq: Four times a day (QID) | RESPIRATORY_TRACT | 12 refills | Status: DC | PRN

## 2020-01-26 NOTE — Telephone Encounter (Signed)
The Duoneb was sent in with a quantity of 3 ml. Did you mean to send in 1 box?

## 2020-01-26 NOTE — Telephone Encounter (Signed)
Spoke with pt who confirms that she is using Spiriva daily.  Advised pt that she doesn't need Nuoeneb and to use only albuterol neb.  Pt requests RX for Albuterol nebs.  Tiajuana Amass, CMA

## 2020-01-26 NOTE — Telephone Encounter (Signed)
See MyChart message

## 2020-01-26 NOTE — Telephone Encounter (Signed)
Sorry, I think there is a separate phone note.  We need to discontinue the DuoNeb.  I discussed send over albuterol.  Because she is already on Spiriva and that would be duplicative if we send over the DuoNeb.

## 2020-01-27 ENCOUNTER — Other Ambulatory Visit: Payer: Self-pay | Admitting: Adult Health

## 2020-01-27 NOTE — Telephone Encounter (Signed)
Albuterol neb vials sent to pharmacy yesterday on 2/22.

## 2020-02-17 ENCOUNTER — Ambulatory Visit: Payer: PPO | Admitting: Family Medicine

## 2020-02-22 ENCOUNTER — Ambulatory Visit (INDEPENDENT_AMBULATORY_CARE_PROVIDER_SITE_OTHER): Payer: PPO | Admitting: Family Medicine

## 2020-02-22 ENCOUNTER — Other Ambulatory Visit: Payer: Self-pay

## 2020-02-22 ENCOUNTER — Encounter: Payer: Self-pay | Admitting: Family Medicine

## 2020-02-22 VITALS — BP 136/59 | HR 98 | Ht 64.0 in | Wt 183.0 lb

## 2020-02-22 DIAGNOSIS — J4541 Moderate persistent asthma with (acute) exacerbation: Secondary | ICD-10-CM | POA: Diagnosis not present

## 2020-02-22 DIAGNOSIS — E1165 Type 2 diabetes mellitus with hyperglycemia: Secondary | ICD-10-CM

## 2020-02-22 DIAGNOSIS — M19049 Primary osteoarthritis, unspecified hand: Secondary | ICD-10-CM

## 2020-02-22 LAB — POCT GLYCOSYLATED HEMOGLOBIN (HGB A1C): Hemoglobin A1C: 7.4 % — AB (ref 4.0–5.6)

## 2020-02-22 NOTE — Assessment & Plan Note (Signed)
A1c much improved today.  A1c is now down to 7.4 up from previous of 10.4.  She is doing much better.  Just encouraged her to track her sugars twice a week fasting over the next few weeks and send those to me so that we can make some fine-tuning adjustments to her current regimen.  Continue to work on [employer] and staying active.  May be able to increase her Trulicity dose.  But first she is going to check to see if there are any more cost effective alternatives for her insurance plan.  If not and we decided to stick with the Trulicity then we can always increase her dose.

## 2020-02-22 NOTE — Patient Instructions (Signed)
Try checking your blood sugar fasting, in the mornings before breakfast at least twice a week.  You can send those to me over my chart if you would like in a few weeks just to that we can take a look at them and make any adjustments that we need to to your medication regimen.  In place of the Trulicity we could try Ozempic which is weekly, or Victoza which is daily  In place of Spiriva, your insurance may cover Tudorza, Spiriva HandiHaler, Incruse.  Check with your insurance provider and mention to your pulmonologist if something is less expensive on your plan.

## 2020-02-22 NOTE — Progress Notes (Signed)
Established Patient Office Visit  Subjective:  Patient ID: Tara Campbell, female    DOB: June 23, 1946  Age: 74 y.o. MRN: [id number]  CC:  Chief Complaint  Patient presents with  . Diabetes    HPI Tara Campbell presents for   F/U DM -doing well overall.  Taking her medications consistently.  She says the Trulicity is quite expensive.  She has not been checking her blood sugars.  Recently just got back from [place] visiting family.  Since then the base of her left thumb at the wrist joint has been hurting and sore.  She was picking up her grandchild he was about 13 pounds.  Follow-up asthma-she is doing okay off her overall.  She has not had to use her nebulizer in several weeks.  She currently is on Spiriva but says that it is quite expensive.  She was previously following with Dr. Lake Bells but they have told her that she will have to find another pulmonologist at their location.  Past Medical History:  Diagnosis Date  . Allergy   . Asthma   . Colon polyps   . Diabetes mellitus without complication ([place])   . Gout   . Heart attack ([place]) 07/30/2016   PCI, 2 stents  . Hyperlipidemia   . Hypertension   . Internal hemorrhoids   . Left rotator cuff tear   . 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 Otomycosis of right ear 09/27/2017  . Paroxysmal atrial fibrillation ([facility])   . Thyroid disease     Past Surgical History:  Procedure Laterality Date  . ANGIOPLASTY    . COLONOSCOPY W/ POLYPECTOMY    . HEMORRHOID BANDING      Family History  Problem Relation Age of Onset  . Hypertension Mother   . Hyperlipidemia Mother   . Glaucoma Father   . Heart disease Father   . Hypertension Father   . Diabetes Father   . Hyperlipidemia Father   . Skin cancer Father   . Diabetes Paternal Grandmother     Social History   Socioeconomic History  . Marital status: Divorced    Spouse name: Not on file  . Number of children: 2  . Years of education: Not on file  . Highest education  level: Not on file  Occupational History  . Not on file  Tobacco Use  . Smoking status: Never Smoker  . Smokeless tobacco: Never Used  Substance and Sexual Activity  . Alcohol use: Yes    Comment: Rare  . Drug use: No  . Sexual activity: Not Currently  Other Topics Concern  . Not on file  Social History Narrative  . Not on file   Social Determinants of Health   Financial Resource Strain:   . Difficulty of Paying Living Expenses:   Food Insecurity:   . Worried About Charity fundraiser in the Last Year:   . Arboriculturist in the Last Year:   Transportation Needs:   . Film/video editor (Medical):   Marland Kitchen Lack of Transportation (Non-Medical):   Physical Activity:   . Days of Exercise per Week:   . Minutes of Exercise per Session:   Stress:   . Feeling of Stress :   Social Connections:   . Frequency of Communication with Friends and Family:   . Frequency of Social Gatherings with Friends and Family:   . Attends Religious Services:   . Active Member of Clubs or Organizations:   . Attends Club or  Organization Meetings:   Marland Kitchen Marital Status:   Intimate Partner Violence:   . Fear of Current or Ex-Partner:   . Emotionally Abused:   Marland Kitchen Physically Abused:   . Sexually Abused:     Outpatient Medications Prior to Visit  Medication Sig Dispense Refill  . albuterol (PROVENTIL) (2.5 MG/3ML) 0.083% nebulizer solution Take 3 mLs (2.5 mg total) by nebulization every 6 (six) hours as needed for wheezing or shortness of breath. 75 mL 12  . albuterol (VENTOLIN HFA) 108 (90 Base) MCG/ACT inhaler Inhale 2 puffs into the lungs every 6 (six) hours as needed for wheezing. 18 g prn  . allopurinol (ZYLOPRIM) 300 MG tablet Take 1 tablet (300 mg total) by mouth 2 (two) times daily. 180 tablet 1  . apixaban (ELIQUIS) 5 MG TABS tablet Take 1 tablet (5 mg total) by mouth 2 (two) times daily. 180 tablet 1  . atenolol (TENORMIN) 100 MG tablet Take 1 tablet (100 mg total) by mouth daily. 90 tablet 1  .  atorvastatin (LIPITOR) 80 MG tablet Take 1 tablet (80 mg total) by mouth at bedtime. 90 tablet 3  . blood glucose meter kit and supplies KIT Check morning fasting blood glucose daily and up to 4 times daily as directed 1 each 0  . BREO ELLIPTA 200-25 MCG/INH AEPB Inhale 1 puff into the lungs daily. 60 each 1  . Cholecalciferol (VITAMIN D3 PO) Take 5,000 mg by mouth daily.    . clopidogrel (PLAVIX) 75 MG tablet TAKE 1 TABLET BY MOUTH  DAILY 90 tablet 3  . COLCRYS 0.6 MG tablet TAKE 1 TABLET BY MOUTH  DAILY AS NEEDED 30 tablet 11  . Cyanocobalamin (B-12 PO) Take 1,000 mg by mouth daily.    . Dulaglutide (TRULICITY) 9.47 SJ/6.2EZ SOPN Inject 1.5 mLs into the skin once a week. 12 pen 1  . fluticasone (FLONASE) 50 MCG/ACT nasal spray Place 1 spray into both nostrils daily. 16 g 3  . glucose blood test strip To be used twice daily for testing blood sugars. E11.65 200 each 11  . ipratropium-albuterol (DUONEB) 0.5-2.5 (3) MG/3ML SOLN Take 3 mLs by nebulization every 6 (six) hours as needed. 3 mL PRN  . irbesartan-hydrochlorothiazide (AVALIDE) 300-12.5 MG tablet Take 1 tablet by mouth daily. 90 tablet 1  . levalbuterol (XOPENEX HFA) 45 MCG/ACT inhaler Inhale 1-2 puffs into the lungs every 6 (six) hours as needed for wheezing or shortness of breath.    . levocetirizine (XYZAL ALLERGY 24HR) 5 MG tablet Take 1 tablet (5 mg total) by mouth every evening. 90 tablet 3  . levothyroxine (SYNTHROID) 112 MCG tablet Take 1 tablet (112 mcg total) by mouth daily before breakfast. 90 tablet 1  . metFORMIN (GLUCOPHAGE) 1000 MG tablet Take 1 tablet (1,000 mg total) by mouth 2 (two) times daily with a meal. 180 tablet 3  . montelukast (SINGULAIR) 10 MG tablet Take 1 tablet (10 mg total) by mouth at bedtime. 90 tablet 3  . nitroGLYCERIN (NITROSTAT) 0.4 MG SL tablet Place 1 tablet (0.4 mg total) under the tongue every 5 (five) minutes as needed for chest pain (or tightness). 30 tablet 0  . Omega-3 Fatty Acids (FISH OIL PO)  Take 5,000 Units by mouth daily.    Marland Kitchen SPIRIVA RESPIMAT 2.5 MCG/ACT AERS Inhale 2 puffs into the lungs daily. 4 g 5  . fluconazole (DIFLUCAN) 150 MG tablet Take 1 tablet (150 mg total) by mouth every other day. 3 tablet 1   No facility-administered medications prior  to visit.    Allergies  Allergen Reactions  . Latex   . Sulfa Antibiotics Rash    ROS Review of Systems    Objective:    Physical Exam  Constitutional: She is oriented to person, place, and time. She appears well-developed and well-nourished.  HENT:  Head: Normocephalic and atraumatic.  Cardiovascular: Normal rate, regular rhythm and normal heart sounds.  Pulmonary/Chest: Effort normal. She has wheezes.  Mild wheezing at the bases bilaterally.  Musculoskeletal:     Comments: On her left hand in particular she has significant deformity of the PIP joints of her fingers.  She also has nodules on the DIP joints on both hands.  She says sometimes the joints do look red red and swollen and sometimes are very tender.  Most of the time she does not have a lot of discomfort or pain.  Neurological: She is alert and oriented to person, place, and time.  Skin: Skin is warm and dry.  Psychiatric: She has a normal mood and affect. Her behavior is normal.    BP (!) 136/59   Pulse 98   Ht 5' 4" (1.626 m)   Wt 183 lb (83 kg)   SpO2 99%   BMI 31.41 kg/m  Wt Readings from Last 3 Encounters:  02/22/20 183 lb (83 kg)  11/16/19 182 lb (82.6 kg)  10/05/19 184 lb (83.5 kg)     Health Maintenance Due  Topic Date Due  . OPHTHALMOLOGY EXAM  12/19/2019  . COLONOSCOPY  02/01/2020    There are no preventive care reminders to display for this patient.  Lab Results  Component Value Date   TSH 3.87 10/09/2019   Lab Results  Component Value Date   WBC 7.7 08/05/2018   HGB 12.8 08/05/2018   HCT 37.2 08/05/2018   MCV 85.5 08/05/2018   PLT 330 08/05/2018   Lab Results  Component Value Date   NA 137 10/09/2019   K 4.2  10/09/2019   CO2 24 10/09/2019   GLUCOSE 235 (H) 10/09/2019   BUN 11 10/09/2019   CREATININE 0.84 10/09/2019   BILITOT 0.6 10/09/2019   ALKPHOS 100 05/29/2017   AST 18 10/09/2019   ALT 18 10/09/2019   PROT 6.6 10/09/2019   ALBUMIN 4.2 05/29/2017   CALCIUM 10.3 10/09/2019   ANIONGAP 12 03/07/2018   Lab Results  Component Value Date   CHOL 290 (H) 10/09/2019   Lab Results  Component Value Date   HDL 49 (L) 10/09/2019   Lab Results  Component Value Date   LDLCALC 186 (H) 10/09/2019   Lab Results  Component Value Date   TRIG 327 (H) 10/09/2019   Lab Results  Component Value Date   CHOLHDL 5.9 (H) 10/09/2019   Lab Results  Component Value Date   HGBA1C 7.4 (A) 02/22/2020      Assessment & Plan:   Problem List Items Addressed This Visit      Respiratory   Moderate persistent asthma    Deviously followed by Dr. Lake Bells she is going to be establishing with a new pulmonologist at their location.  Did give her some options in place of the Spiriva to see if they are more cost effective for her insurance plan.  She actually does have some wheezing on exam today so encouraged her to go home and use her albuterol.        Endocrine   Uncontrolled type 2 diabetes mellitus with hyperglycemia ([facility]) - Primary    A1c much improved  today.  A1c is now down to 7.4 up from previous of 10.4.  She is doing much better.  Just encouraged her to track her sugars twice a week fasting over the next few weeks and send those to me so that we can make some fine-tuning adjustments to her current regimen.  Continue to work on [employer] and staying active.  May be able to increase her Trulicity dose.  But first she is going to check to see if there are any more cost effective alternatives for her insurance plan.  If not and we decided to stick with the Trulicity then we can always increase her dose.      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  I would like to get some blood work just to rule out other causes.  She also does have a history of gout.  No orders of the defined types were placed in this encounter.   Follow-up: Return in about 3 months (around 05/24/2020) for Diabetes follow-up.    Beatrice Lecher, MD

## 2020-02-22 NOTE — Assessment & Plan Note (Signed)
Deviously followed by Dr. Kendrick Fries she is going to be establishing with a new pulmonologist at their location.  Did give her some options in place of the [facility] to see if they are more cost effective for her insurance plan.  She actually does have some wheezing on exam today so encouraged her to go home and use her albuterol.

## 2020-02-23 LAB — BASIC METABOLIC PANEL WITH GFR
BUN: 16 mg/dL (ref 7–25)
CO2: 24 mmol/L (ref 20–32)
Calcium: 10.5 mg/dL — ABNORMAL HIGH (ref 8.6–10.4)
Chloride: 104 mmol/L (ref 98–110)
Creat: 0.8 mg/dL (ref 0.60–0.93)
GFR, Est African American: 84 mL/min/{1.73_m2} (ref >=60)
GFR, Est Non African American: 72 mL/min/{1.73_m2} (ref >=60)
Glucose, Bld: 131 mg/dL (ref 65–139)
Potassium: 4.1 mmol/L (ref 3.5–5.3)
Sodium: 141 mmol/L (ref 135–146)

## 2020-02-23 LAB — SEDIMENTATION RATE: Sed Rate: 29 mm/h (ref 0–30)

## 2020-02-23 LAB — URIC ACID: Uric Acid, Serum: 5.6 mg/dL (ref 2.5–7.0)

## 2020-02-23 LAB — CYCLIC CITRUL PEPTIDE ANTIBODY, IGG: Cyclic Citrullin Peptide Ab: <16 UNITS

## 2020-02-23 LAB — RHEUMATOID FACTOR: Rheumatoid fact SerPl-aCnc: <14 IU/mL (ref <14)

## 2020-04-05 IMAGING — CT CT PARANASAL SINUSES LIMITED
3 series · 12 of 47 positions shown, 14 images · non-contrast
Comparison: None available.

CLINICAL DATA: Initial evaluation for subacute sinusitis, status
post 2 rounds of failed antibiotic therapy. Possible surgery.

EXAM:
CT PARANASAL SINUS LIMITED WITHOUT CONTRAST
TECHNIQUE: Multidetector CT images of the paranasal sinuses were obtained using
the standard protocol without intravenous contrast.

[Series 3: ax soft · axial · 0.38mm/px · z∈[-204,-120]mm · 6 of 54 slices shown, 8 images]
[im 6/54  brain]
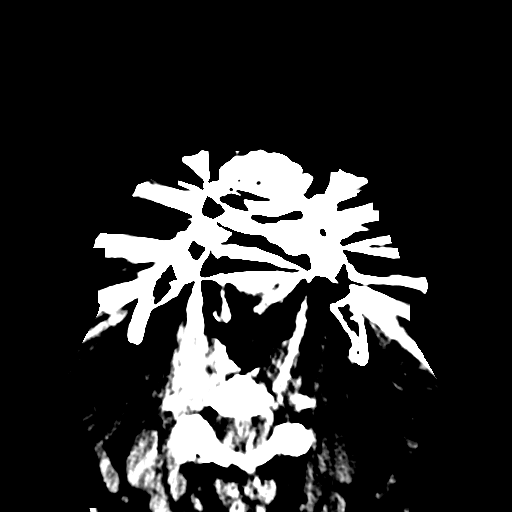
[im 6/54  bone]
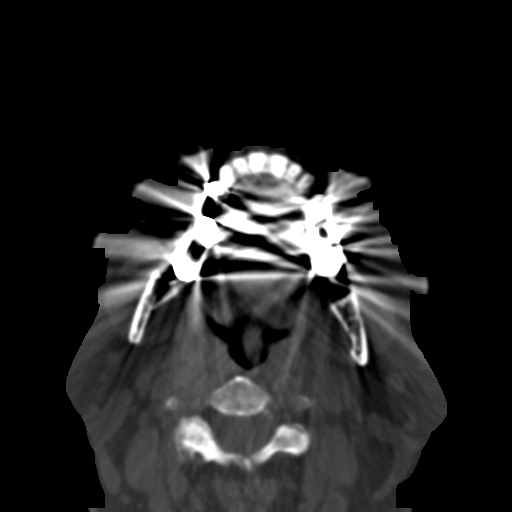
[im 15/54  bone]
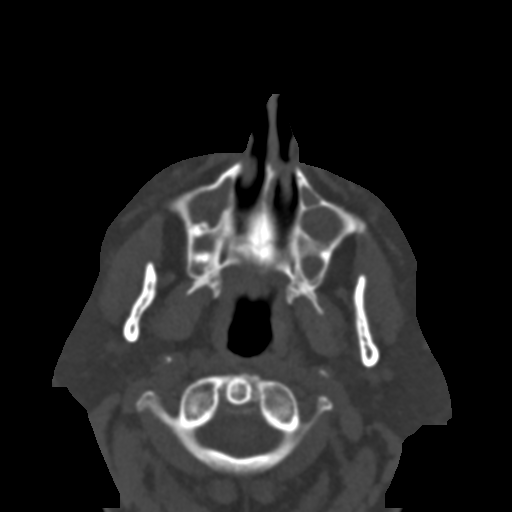
[im 22/54  bone]
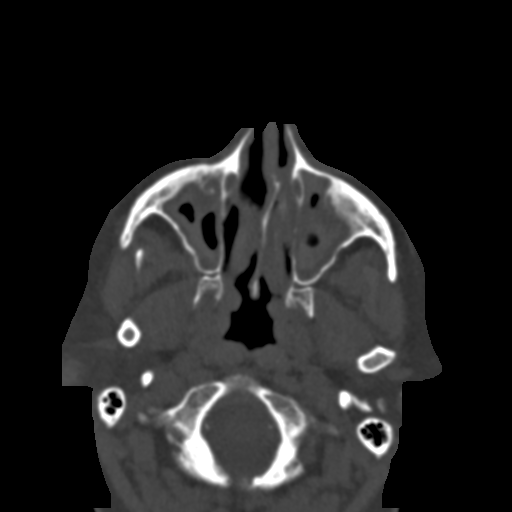
[im 32/54  bone]
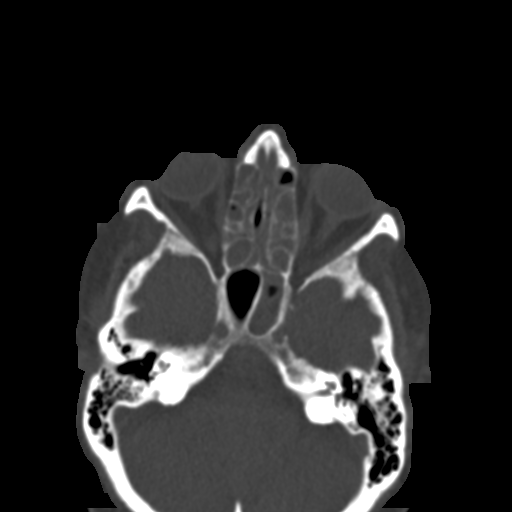
[im 41/54  brain]
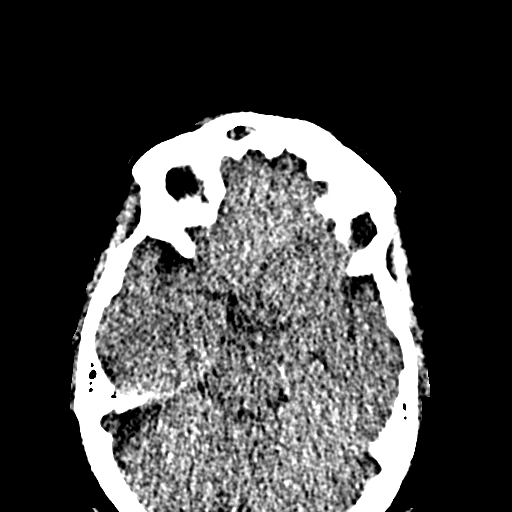
[im 41/54  bone]
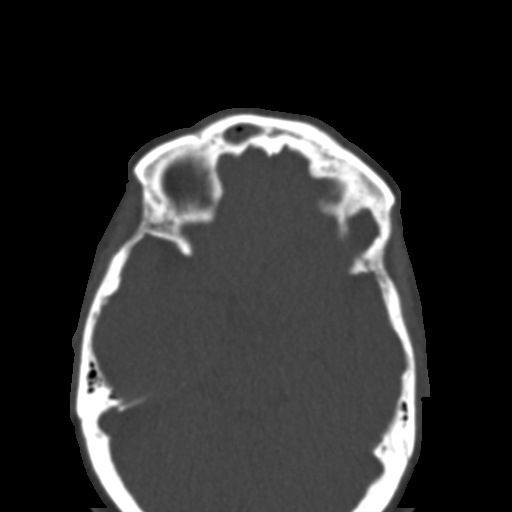
[im 48/54  bone]
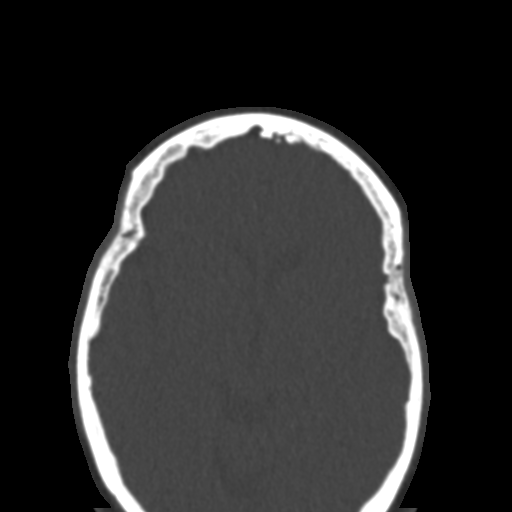

[Series 4: coronal bone · coronal · 0.22mm/px · 3 of 85 slices shown]
[im 29/85  bone]
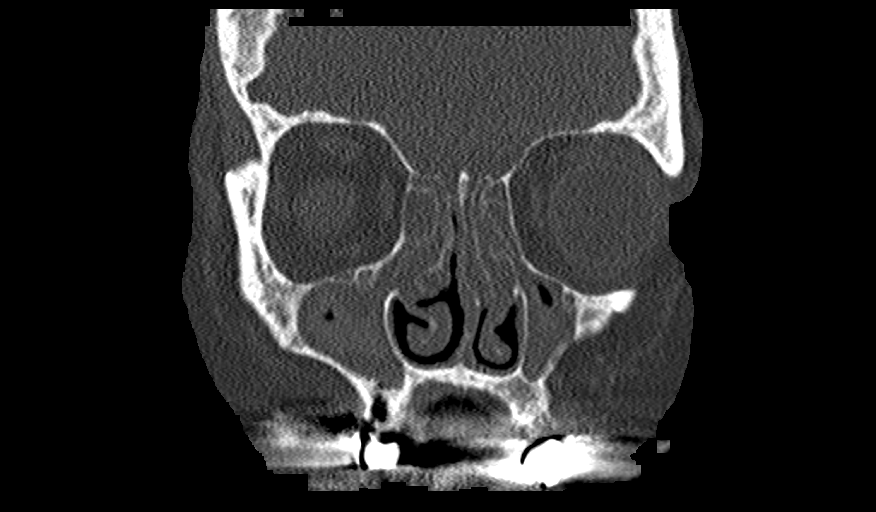
[im 38/85  bone]
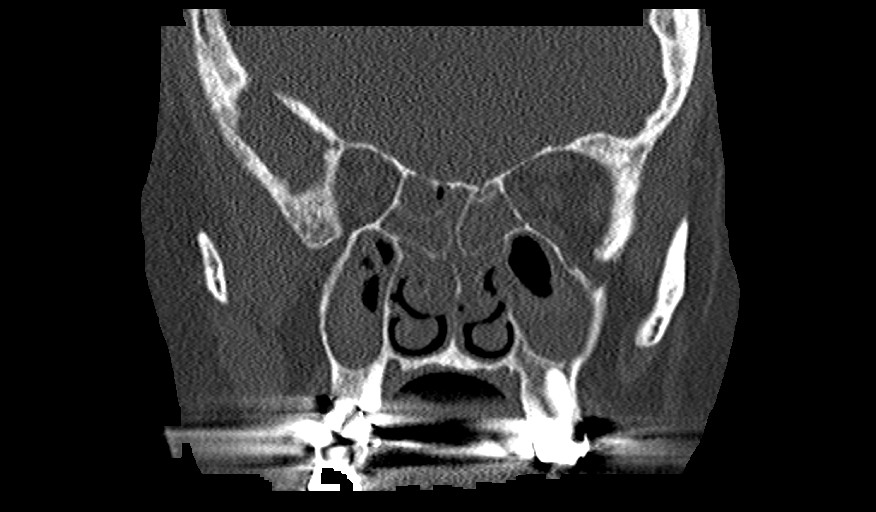
[im 47/85  bone]
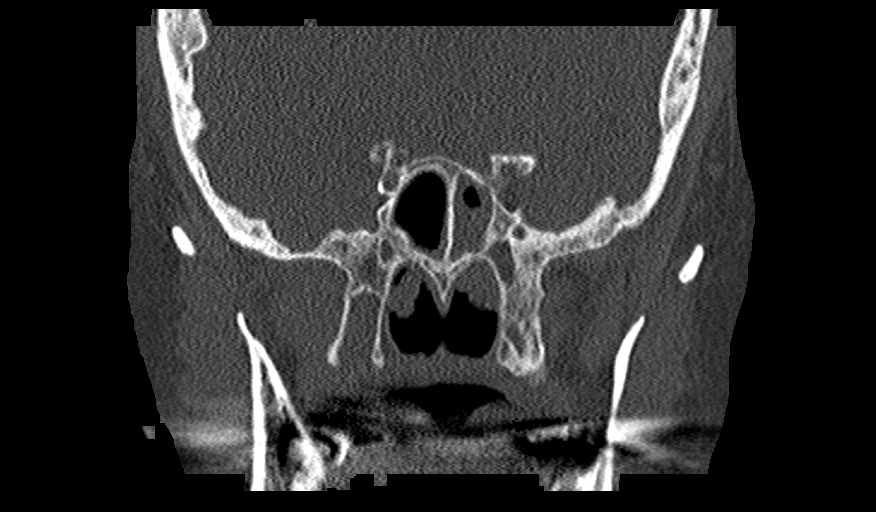

[Series 5: sagittal bone · sagittal · 0.21mm/px · 3 of 79 slices shown]
[im 27/79  bone]
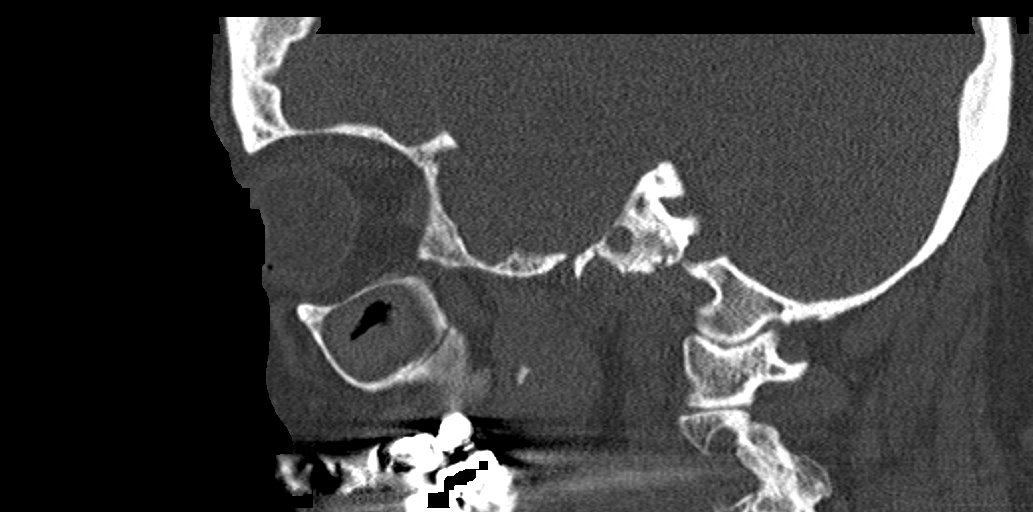
[im 40/79  bone]
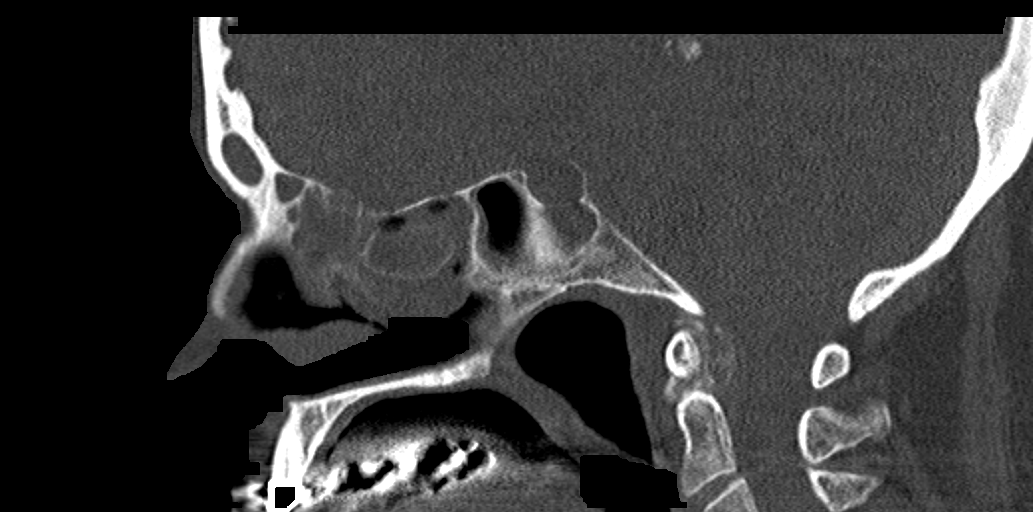
[im 53/79  bone]
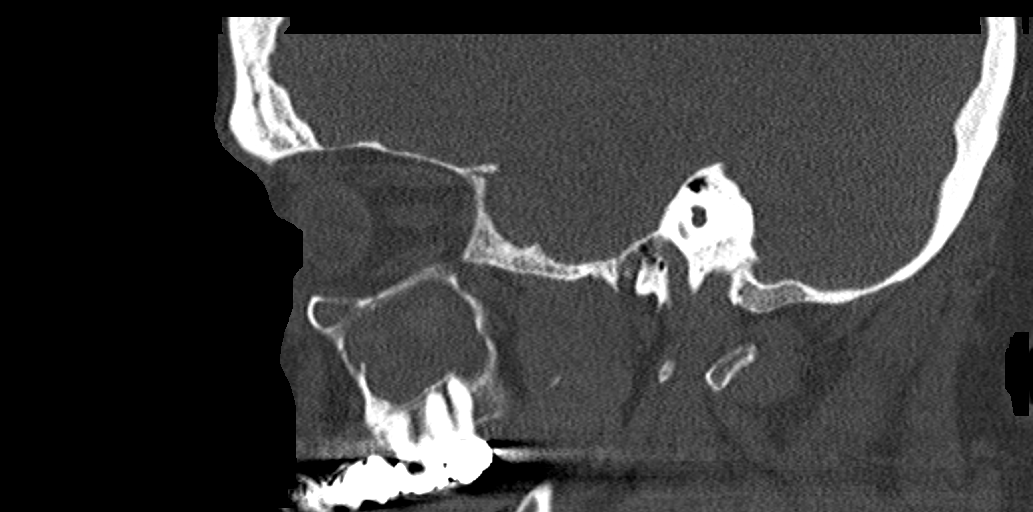

[12 of 47 positions shown; findings below may reference images not displayed]

FINDINGS: Paranasal sinuses:

Frontal: Dominant right frontal sinus nearly completely opacified.
Left frontal sinus hypoplastic and underpneumatized. Frontoethmoidal
recesses completely opacified.

Ethmoid: Advanced mucosal thickening and opacity throughout the
anterior and posterior ethmoidal air cells bilaterally, essentially
completely opacified.

Maxillary: Advanced mucosal thickening and opacity within the
maxillary sinuses bilaterally. Central hypodensity compatible with
desiccated secretions and/or superimposed fungal elements.

Sphenoid: Chronic opacification of the left sphenoid sinus. Mild
circumferential mucosal thickening within the right sphenoid sinus
wrist remains partially aerated.

Right ostiomeatal unit: Right ostiomeatal unit completely opacified
and occluded.

Left ostiomeatal unit: Left ostiomeatal unit completely opacified
and occluded.

Nasal passages: Mucosal thickening and hypertrophy about the middle
and inferior turbinates bilaterally, left greater than right.
Polypoid mucosal thickening partially obscures both nasal passages,
which could be related to mucosal hypertrophy and/or polyps. Mild 3
mm right-to-left nasal septal deviation

Other: Small left mastoid effusion, of doubtful clinical
significance. Mastoid air cells and middle ear cavities otherwise
well pneumatized and free of fluid. Visualized intracranial contents
within normal limits. Scattered vascular calcifications noted within
the carotid siphons. Globes orbital soft tissues within normal
limits. Visualized soft tissues of the face within normal limits.
IMPRESSION: Extensive chronic pan sinusitis as detailed above.

## 2020-05-24 ENCOUNTER — Other Ambulatory Visit: Payer: Self-pay | Admitting: Adult Health

## 2020-05-24 ENCOUNTER — Ambulatory Visit: Payer: PPO | Admitting: Family Medicine

## 2020-05-31 NOTE — Progress Notes (Signed)
HPI: FU atrial fibrillation. Cardiac catheterization in [place] in 2017 showed severe stenosis in the right coronary and patient had PCI at that time. Patient apparently was admitted to [facility] in February 2018 with asthma exacerbation/flu and atrial fibrillation associated with these conditions and Sudafed.  Since last seen February 2019, the patient denies any dyspnea on exertion, orthopnea, PND, pedal edema, palpitations, syncope or chest pain.   Current Outpatient Medications  Medication Sig Dispense Refill  . albuterol (PROVENTIL) (2.5 MG/3ML) 0.083% nebulizer solution Take 3 mLs (2.5 mg total) by nebulization every 6 (six) hours as needed for wheezing or shortness of breath. 75 mL 12  . albuterol (VENTOLIN HFA) 108 (90 Base) MCG/ACT inhaler Inhale 2 puffs into the lungs every 6 (six) hours as needed for wheezing. 18 g prn  . allopurinol (ZYLOPRIM) 300 MG tablet Take 1 tablet (300 mg total) by mouth 2 (two) times daily. 180 tablet 1  . apixaban (ELIQUIS) 5 MG TABS tablet Take 1 tablet (5 mg total) by mouth 2 (two) times daily. 180 tablet 1  . atenolol (TENORMIN) 100 MG tablet Take 1 tablet (100 mg total) by mouth daily. 90 tablet 1  . atorvastatin (LIPITOR) 80 MG tablet Take 1 tablet (80 mg total) by mouth at bedtime. 90 tablet 3  . blood glucose meter kit and supplies KIT Check morning fasting blood glucose daily and up to 4 times daily as directed 1 each 0  . clopidogrel (PLAVIX) 75 MG tablet TAKE 1 TABLET BY MOUTH  DAILY 90 tablet 3  . COLCRYS 0.6 MG tablet TAKE 1 TABLET BY MOUTH  DAILY AS NEEDED 30 tablet 11  . Cyanocobalamin (B-12 PO) Take 1,000 mg by mouth daily.    . Dulaglutide (TRULICITY) 9.34 MG/8.4AT SOPN Inject 1.5 mLs into the skin once a week. 12 pen 1  . fluticasone (FLONASE) 50 MCG/ACT nasal spray Place 1 spray into both nostrils daily. 16 g 3  . fluticasone furoate-vilanterol (BREO ELLIPTA) 200-25 MCG/INH AEPB Inhale 1 puff into the lungs daily. Needs appt for  further refills 60 each 0  . glucose blood test strip To be used twice daily for testing blood sugars. E11.65 200 each 11  . ipratropium-albuterol (DUONEB) 0.5-2.5 (3) MG/3ML SOLN Take 3 mLs by nebulization every 6 (six) hours as needed. 3 mL PRN  . irbesartan-hydrochlorothiazide (AVALIDE) 300-12.5 MG tablet Take 1 tablet by mouth daily. 90 tablet 1  . levalbuterol (XOPENEX HFA) 45 MCG/ACT inhaler Inhale 1-2 puffs into the lungs every 6 (six) hours as needed for wheezing or shortness of breath.    . levocetirizine (XYZAL ALLERGY 24HR) 5 MG tablet Take 1 tablet (5 mg total) by mouth every evening. 90 tablet 3  . levothyroxine (SYNTHROID) 112 MCG tablet Take 1 tablet (112 mcg total) by mouth daily before breakfast. 90 tablet 1  . metFORMIN (GLUCOPHAGE) 1000 MG tablet Take 1 tablet (1,000 mg total) by mouth 2 (two) times daily with a meal. 180 tablet 3  . montelukast (SINGULAIR) 10 MG tablet Take 1 tablet (10 mg total) by mouth at bedtime. 90 tablet 3  . nitroGLYCERIN (NITROSTAT) 0.4 MG SL tablet Place 1 tablet (0.4 mg total) under the tongue every 5 (five) minutes as needed for chest pain (or tightness). 30 tablet 0  . Omega-3 Fatty Acids (FISH OIL PO) Take 5,000 Units by mouth daily.    Marland Kitchen SPIRIVA RESPIMAT 2.5 MCG/ACT AERS Inhale 2 puffs into the lungs daily. 4 g 5  No current facility-administered medications for this visit.     Past Medical History:  Diagnosis Date  . Allergy   . Asthma   . Colon polyps   . Diabetes mellitus without complication ([place])   . Gout   . Heart attack ([place]) 07/30/2016   PCI, 2 stents  . Hyperlipidemia   . Hypertension   . Internal hemorrhoids   . Left rotator cuff tear   . 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 Otomycosis of right ear 09/27/2017  . Paroxysmal atrial fibrillation (H[facility])   . Thyroid disease     Past Surgical History:  Procedure Laterality Date  . ANGIOPLASTY    . COLONOSCOPY W/ POLYPECTOMY    . HEMORRHOID BANDING      Social History    Socioeconomic History  . Marital status: Divorced    Spouse name: Not on file  . Number of children: 2  . Years of education: Not on file  . Highest education level: Not on file  Occupational History  . Not on file  Tobacco Use  . Smoking status: Never Smoker  . Smokeless tobacco: Never Used  Vaping Use  . Vaping Use: Never used  Substance and Sexual Activity  . Alcohol use: Yes    Comment: Rare  . Drug use: No  . Sexual activity: Not Currently  Other Topics Concern  . Not on file  Social History Narrative  . Not on file   Social Determinants of Health   Financial Resource Strain:   . Difficulty of Paying Living Expenses:   Food Insecurity:   . Worried About Charity fundraiser in the Last Year:   . Arboriculturist in the Last Year:   Transportation Needs:   . Film/video editor (Medical):   Marland Kitchen Lack of Transportation (Non-Medical):   Physical Activity:   . Days of Exercise per Week:   . Minutes of Exercise per Session:   Stress:   . Feeling of Stress :   Social Connections:   . Frequency of Communication with Friends and Family:   . Frequency of Social Gatherings with Friends and Family:   . Attends Religious Services:   . Active Member of Clubs or Organizations:   . Attends Archivist Meetings:   Marland Kitchen Marital Status:   Intimate Partner Violence:   . Fear of Current or Ex-Partner:   . Emotionally Abused:   Marland Kitchen Physically Abused:   . Sexually Abused:     Family History  Problem Relation Age of Onset  . Hypertension Mother   . Hyperlipidemia Mother   . Glaucoma Father   . Heart disease Father   . Hypertension Father   . Diabetes Father   . Hyperlipidemia Father   . Skin cancer Father   . Diabetes Paternal Grandmother     ROS: no fevers or chills, productive cough, hemoptysis, dysphasia, odynophagia, melena, hematochezia, dysuria, hematuria, rash, seizure activity, orthopnea, PND, pedal edema, claudication. Remaining systems are  negative.  Physical Exam: Well-developed well-nourished in no acute distress.  Skin is warm and dry.  HEENT is normal.  Neck is supple.  Chest is clear to auscultation with normal expansion.  Cardiovascular exam is regular rate and rhythm.  Abdominal exam nontender or distended. No masses palpated. Extremities show no edema. neuro grossly intact  ECG-normal sinus rhythm, first-degree AV block, no ST changes.  Personally reviewed  A/P  1 paroxysmal atrial fibrillation-patient remains in sinus rhythm.  CHA2DS2-VASc 6.  Continue apixaban.  Check hemoglobin. continue beta-blocker at present dose.  Note LV function normal on previous catheterization.  We will plan echocardiogram to reassess.  2 hypertension-patient's blood pressure is controlled.  Continue present medications.  3 coronary artery disease-patient has had prior PCI of RCA.  Continue statin.  No aspirin given need for apixaban.  4 hyperlipidemia-continue statin.  Kirk Ruths, MD

## 2020-06-07 ENCOUNTER — Other Ambulatory Visit: Payer: Self-pay | Admitting: Family Medicine

## 2020-06-07 DIAGNOSIS — Z1231 Encounter for screening mammogram for malignant neoplasm of breast: Secondary | ICD-10-CM

## 2020-06-08 ENCOUNTER — Other Ambulatory Visit: Payer: Self-pay

## 2020-06-08 ENCOUNTER — Ambulatory Visit: Payer: PPO | Admitting: Cardiology

## 2020-06-08 ENCOUNTER — Encounter: Payer: Self-pay | Admitting: Cardiology

## 2020-06-08 ENCOUNTER — Ambulatory Visit (INDEPENDENT_AMBULATORY_CARE_PROVIDER_SITE_OTHER): Payer: PPO

## 2020-06-08 VITALS — BP 113/68 | HR 94 | Ht 64.0 in | Wt 188.0 lb

## 2020-06-08 DIAGNOSIS — Z1231 Encounter for screening mammogram for malignant neoplasm of breast: Secondary | ICD-10-CM

## 2020-06-08 DIAGNOSIS — I10 Essential (primary) hypertension: Secondary | ICD-10-CM | POA: Diagnosis not present

## 2020-06-08 DIAGNOSIS — I2510 Atherosclerotic heart disease of native coronary artery without angina pectoris: Secondary | ICD-10-CM | POA: Diagnosis not present

## 2020-06-08 DIAGNOSIS — I480 Paroxysmal atrial fibrillation: Secondary | ICD-10-CM

## 2020-06-08 NOTE — Patient Instructions (Signed)
Medication Instructions:   NO CHANGE  *If you need a refill on your cardiac medications before your next appointment, please call your pharmacy*   Lab Work:  Your physician recommends that you HAVE LAB WORK TODAY  If you have labs (blood work) drawn today and your tests are completely normal, you will receive your results only by: Marland Kitchen MyChart Message (if you have MyChart) OR . A paper copy in the mail If you have any lab test that is abnormal or we need to change your treatment, we will call you to review the results.   Testing/Procedures:  Your physician has requested that you have an echocardiogram. Echocardiography is a painless test that uses sound waves to create images of your heart. It provides your doctor with information about the size and shape of your heart and how well your heart's chambers and valves are working. This procedure takes approximately one hour. There are no restrictions for this procedure.[facility] ST FLOOR IMAGING DEPARTMENT  Follow-Up: At [facility], you and your health needs are our priority.  As part of our continuing mission to provide you with exceptional heart care, we have created designated Provider Care Teams.  These Care Teams include your primary Cardiologist (physician) and Advanced Practice Providers (APPs -  Physician Assistants and Nurse Practitioners) who all work together to provide you with the care you need, when you need it.  We recommend signing up for the patient portal called "MyChart".  Sign up information is provided on this After Visit Summary.  MyChart is used to connect with patients for Virtual Visits (Telemedicine).  Patients are able to view lab/test results, encounter notes, upcoming appointments, etc.  Non-urgent messages can be sent to your provider as well.   To learn more about what you can do with MyChart, go to [email or web address].    Your next appointment:   12 month(s)  The format for your next  appointment:   In Person  Provider:   Olga Millers, MD

## 2020-06-09 LAB — CBC
HCT: 40.2 % (ref 35.0–45.0)
Hemoglobin: 13.8 g/dL (ref 11.7–15.5)
MCH: 29.9 pg (ref 27.0–33.0)
MCHC: 34.3 g/dL (ref 32.0–36.0)
MCV: 87.2 fL (ref 80.0–100.0)
MPV: 9.9 fL (ref 7.5–12.5)
Platelets: 401 10*3/uL — ABNORMAL HIGH (ref 140–400)
RBC: 4.61 10*6/uL (ref 3.80–5.10)
RDW: 12.6 % (ref 11.0–15.0)
WBC: 10.7 10*3/uL (ref 3.8–10.8)

## 2020-06-20 ENCOUNTER — Encounter: Payer: Self-pay | Admitting: Family Medicine

## 2020-06-20 ENCOUNTER — Other Ambulatory Visit: Payer: Self-pay

## 2020-06-20 ENCOUNTER — Ambulatory Visit (INDEPENDENT_AMBULATORY_CARE_PROVIDER_SITE_OTHER): Payer: PPO | Admitting: Family Medicine

## 2020-06-20 VITALS — BP 129/56 | HR 78 | Ht 64.0 in | Wt 188.0 lb

## 2020-06-20 DIAGNOSIS — E1165 Type 2 diabetes mellitus with hyperglycemia: Secondary | ICD-10-CM | POA: Diagnosis not present

## 2020-06-20 DIAGNOSIS — E1169 Type 2 diabetes mellitus with other specified complication: Secondary | ICD-10-CM | POA: Diagnosis not present

## 2020-06-20 DIAGNOSIS — I10 Essential (primary) hypertension: Secondary | ICD-10-CM

## 2020-06-20 DIAGNOSIS — Z1211 Encounter for screening for malignant neoplasm of colon: Secondary | ICD-10-CM

## 2020-06-20 DIAGNOSIS — E782 Mixed hyperlipidemia: Secondary | ICD-10-CM

## 2020-06-20 DIAGNOSIS — E118 Type 2 diabetes mellitus with unspecified complications: Secondary | ICD-10-CM

## 2020-06-20 DIAGNOSIS — J4520 Mild intermittent asthma, uncomplicated: Secondary | ICD-10-CM | POA: Diagnosis not present

## 2020-06-20 LAB — POCT GLYCOSYLATED HEMOGLOBIN (HGB A1C): Hemoglobin A1C: 6.9 % — AB (ref 4.0–5.6)

## 2020-06-20 MED ORDER — TRULICITY 0.75 MG/0.5ML ~~LOC~~ SOAJ: 2.2500 mg | SUBCUTANEOUS | 3 refills | Status: DC

## 2020-06-20 NOTE — Progress Notes (Signed)
 Established Patient Office Visit  Subjective:  Patient ID: Tara Campbell, female    DOB: 11/27/1946  Age: 74 y.o. MRN: [id number]  CC:  Chief Complaint  Patient presents with  . Diabetes  . Hypertension    HPI Tara Campbell presents for   Hypertension- Pt denies chest pain, SOB, dizziness, or heart palpitations.  Taking meds as directed w/o problems.  Denies medication side effects.    Diabetes - no hypoglycemic events. No wounds or sores that are not healing well. No increased thirst or urination. Checking glucose at home. Taking medications as prescribed without any side effects.  No recent gout flares.  ON allopurinol 300mg.    She reports her asthma has been well controlled since starting allergy drops. If has mad a big difference in her sxs.  No recent use of prednisone.  Has ony had to use rescue inhaler once in the last 3-4 months. Has had her COVID vaccine.   Past Medical History:  Diagnosis Date  . Allergy   . Asthma   . Colon polyps   . Diabetes mellitus without complication ([facility])   . Gout   . Heart attack ([facility]) 07/30/2016   PCI, 2 stents  . Hyperlipidemia   . Hypertension   . Internal hemorrhoids   . Left rotator cuff tear   . 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 Otomycosis of right ear 09/27/2017  . Paroxysmal atrial fibrillation ([facility])   . Thyroid disease     Past Surgical History:  Procedure Laterality Date  . ANGIOPLASTY    . COLONOSCOPY W/ POLYPECTOMY    . HEMORRHOID BANDING      Family History  Problem Relation Age of Onset  . Hypertension Mother   . Hyperlipidemia Mother   . Glaucoma Father   . Heart disease Father   . Hypertension Father   . Diabetes Father   . Hyperlipidemia Father   . Skin cancer Father   . Diabetes Paternal Grandmother     Social History   Socioeconomic History  . Marital status: Divorced    Spouse name: Not on file  . Number of children: 2  . Years of education: Not on file  . Highest education level: Not  on file  Occupational History  . Not on file  Tobacco Use  . Smoking status: Never Smoker  . Smokeless tobacco: Never Used  Vaping Use  . Vaping Use: Never used  Substance and Sexual Activity  . Alcohol use: Yes    Comment: Rare  . Drug use: No  . Sexual activity: Not Currently  Other Topics Concern  . Not on file  Social History Narrative  . Not on file   Social Determinants of Health   Financial Resource Strain:   . Difficulty of Paying Living Expenses:   Food Insecurity:   . Worried About Running Out of Food in the Last Year:   . Ran Out of Food in the Last Year:   Transportation Needs:   . Lack of Transportation (Medical):   . Lack of Transportation (Non-Medical):   Physical Activity:   . Days of Exercise per Week:   . Minutes of Exercise per Session:   Stress:   . Feeling of Stress :   Social Connections:   . Frequency of Communication with Friends and Family:   . Frequency of Social Gatherings with Friends and Family:   . Attends Religious Services:   . Active Member of Clubs or Organizations:   .   Attends Club or Organization Meetings:   . Marital Status:   Intimate Partner Violence:   . Fear of Current or Ex-Partner:   . Emotionally Abused:   . Physically Abused:   . Sexually Abused:     Outpatient Medications Prior to Visit  Medication Sig Dispense Refill  . albuterol (PROVENTIL) (2.5 MG/3ML) 0.083% nebulizer solution Take 3 mLs (2.5 mg total) by nebulization every 6 (six) hours as needed for wheezing or shortness of breath. 75 mL 12  . albuterol (VENTOLIN HFA) 108 (90 Base) MCG/ACT inhaler Inhale 2 puffs into the lungs every 6 (six) hours as needed for wheezing. 18 g prn  . allopurinol (ZYLOPRIM) 300 MG tablet Take 1 tablet (300 mg total) by mouth 2 (two) times daily. 180 tablet 1  . apixaban (ELIQUIS) 5 MG TABS tablet Take 1 tablet (5 mg total) by mouth 2 (two) times daily. 180 tablet 1  . atenolol (TENORMIN) 100 MG tablet Take 1 tablet (100 mg total) by  mouth daily. 90 tablet 1  . atorvastatin (LIPITOR) 80 MG tablet Take 1 tablet (80 mg total) by mouth at bedtime. 90 tablet 3  . blood glucose meter kit and supplies KIT Check morning fasting blood glucose daily and up to 4 times daily as directed 1 each 0  . COLCRYS 0.6 MG tablet TAKE 1 TABLET BY MOUTH  DAILY AS NEEDED 30 tablet 11  . Cyanocobalamin (B-12 PO) Take 1,000 mg by mouth daily.    . fluticasone (FLONASE) 50 MCG/ACT nasal spray Place 1 spray into both nostrils daily. 16 g 3  . fluticasone furoate-vilanterol (BREO ELLIPTA) 200-25 MCG/INH AEPB Inhale 1 puff into the lungs daily. Needs appt for further refills 60 each 0  . glucose blood test strip To be used twice daily for testing blood sugars. E11.65 200 each 11  . ipratropium-albuterol (DUONEB) 0.5-2.5 (3) MG/3ML SOLN Take 3 mLs by nebulization every 6 (six) hours as needed. 3 mL PRN  . irbesartan-hydrochlorothiazide (AVALIDE) 300-12.5 MG tablet Take 1 tablet by mouth daily. 90 tablet 1  . levalbuterol (XOPENEX HFA) 45 MCG/ACT inhaler Inhale 1-2 puffs into the lungs every 6 (six) hours as needed for wheezing or shortness of breath.    . levocetirizine (XYZAL ALLERGY 24HR) 5 MG tablet Take 1 tablet (5 mg total) by mouth every evening. 90 tablet 3  . levothyroxine (SYNTHROID) 112 MCG tablet Take 1 tablet (112 mcg total) by mouth daily before breakfast. 90 tablet 1  . metFORMIN (GLUCOPHAGE) 1000 MG tablet Take 1 tablet (1,000 mg total) by mouth 2 (two) times daily with a meal. 180 tablet 3  . montelukast (SINGULAIR) 10 MG tablet Take 1 tablet (10 mg total) by mouth at bedtime. 90 tablet 3  . nitroGLYCERIN (NITROSTAT) 0.4 MG SL tablet Place 1 tablet (0.4 mg total) under the tongue every 5 (five) minutes as needed for chest pain (or tightness). 30 tablet 0  . Omega-3 Fatty Acids (FISH OIL PO) Take 5,000 Units by mouth daily.    . SPIRIVA RESPIMAT 2.5 MCG/ACT AERS Inhale 2 puffs into the lungs daily. 4 g 5  . Dulaglutide (TRULICITY) 0.75  MG/0.5ML SOPN Inject 1.5 mLs into the skin once a week. 12 pen 1   No facility-administered medications prior to visit.    Allergies  Allergen Reactions  . Latex   . Sulfa Antibiotics Rash    ROS Review of Systems    Objective:    Physical Exam Constitutional:      Appearance: She   is well-developed.  HENT:     Head: Normocephalic and atraumatic.  Cardiovascular:     Rate and Rhythm: Normal rate and regular rhythm.     Heart sounds: Normal heart sounds.  Pulmonary:     Effort: Pulmonary effort is normal.     Breath sounds: Normal breath sounds.  Skin:    General: Skin is warm and dry.  Neurological:     Mental Status: She is alert and oriented to person, place, and time.  Psychiatric:        Behavior: Behavior normal.     BP (!) 129/56   Pulse 78   Ht 5' 4" (1.626 m)   Wt 188 lb (85.3 kg)   SpO2 96%   BMI 32.27 kg/m  Wt Readings from Last 3 Encounters:  06/20/20 188 lb (85.3 kg)  06/08/20 188 lb (85.3 kg)  02/22/20 183 lb (83 kg)     Health Maintenance Due  Topic Date Due  . COLONOSCOPY  02/01/2020    There are no preventive care reminders to display for this patient.  Lab Results  Component Value Date   TSH 3.87 10/09/2019   Lab Results  Component Value Date   WBC 10.7 06/08/2020   HGB 13.8 06/08/2020   HCT 40.2 06/08/2020   MCV 87.2 06/08/2020   PLT 401 (H) 06/08/2020   Lab Results  Component Value Date   NA 141 02/22/2020   K 4.1 02/22/2020   CO2 24 02/22/2020   GLUCOSE 131 02/22/2020   BUN 16 02/22/2020   CREATININE 0.80 02/22/2020   BILITOT 0.6 10/09/2019   ALKPHOS 100 05/29/2017   AST 18 10/09/2019   ALT 18 10/09/2019   PROT 6.6 10/09/2019   ALBUMIN 4.2 05/29/2017   CALCIUM 10.5 (H) 02/22/2020   ANIONGAP 12 03/07/2018   Lab Results  Component Value Date   CHOL 290 (H) 10/09/2019   Lab Results  Component Value Date   HDL 49 (L) 10/09/2019   Lab Results  Component Value Date   LDLCALC 186 (H) 10/09/2019   Lab  Results  Component Value Date   TRIG 327 (H) 10/09/2019   Lab Results  Component Value Date   CHOLHDL 5.9 (H) 10/09/2019   Lab Results  Component Value Date   HGBA1C 6.9 (A) 06/20/2020      Assessment & Plan:   Problem List Items Addressed This Visit      Cardiovascular and Mediastinum   Hypertension goal BP (blood pressure) < 140/90 - Primary    Well controlled. Continue current regimen. Follow up in  6 mo        Respiratory   Mild intermittent asthma    She feels like right now her asthma is actually really well controlled.  She is really just using her medications as needed.  In fact she said she is only had to use her nebulizer once in the last 3 to 4 months.  Even with acute heat and humidity, which is typically a trigger for her she has been doing well she feels like doing the allergy drops has made a big difference in her asthma control.        Endocrine   Mixed diabetic hyperlipidemia associated with type 2 diabetes mellitus (HCC)   Relevant Medications   Dulaglutide (TRULICITY) 0.75 MG/0.5ML SOPN   Controlled diabetes mellitus type 2 with complications (HCC)    A!C is improved.  A1C of 6.9.  Much improved from previous.  She said she is really   been trying hard to increase her vegetable intake and is doing well.  She says she is trying to eat with her sister couple times a week and that is actually been a good influence on her dietary habits.  Follow-up in 3 months.      Relevant Medications   Dulaglutide (TRULICITY) 2.35 TD/3.2KG SOPN    Other Visit Diagnoses    Screening for malignant neoplasm of colon       Relevant Orders   Ambulatory referral to Gastroenterology      Based on her records it looks like she is due for repeat colon cancer screening she had 2 polyps removed and I recommended that she follow back up in 5 years.  We will go ahead and place referral today.  She is hopeful that after this when she will not need any further colonoscopies.  Meds  ordered this encounter  Medications  . Dulaglutide (TRULICITY) 2.54 YH/0.6CB SOPN    Sig: Inject 1.5 mLs (2.25 mg total) into the skin once a week.    Dispense:  4 pen    Refill:  3    Follow-up: Return in about 3 months (around 09/20/2020) for Diabetes follow-up.    Beatrice Lecher, MD

## 2020-06-20 NOTE — Assessment & Plan Note (Signed)
A!C is improved.  A1C of 6.9.  Much improved from previous.  She said she is really been trying hard to increase her vegetable intake and is doing well.  She says she is trying to eat with her sister couple times a week and that is actually been a good influence on her dietary habits.  Follow-up in 3 months.

## 2020-06-20 NOTE — Assessment & Plan Note (Signed)
Well controlled. Continue current regimen. Follow up in  6 mo  

## 2020-06-20 NOTE — Assessment & Plan Note (Signed)
She feels like right now her asthma is actually really well controlled.  She is really just using her medications as needed.  In fact she said she is only had to use her nebulizer once in the last 3 to 4 months.  Even with acute heat and humidity, which is typically a trigger for her she has been doing well she feels like doing the allergy drops has made a big difference in her asthma control.

## 2020-06-22 ENCOUNTER — Telehealth: Payer: Self-pay | Admitting: Gastroenterology

## 2020-06-22 ENCOUNTER — Telehealth: Payer: Self-pay

## 2020-06-22 DIAGNOSIS — E1165 Type 2 diabetes mellitus with hyperglycemia: Secondary | ICD-10-CM

## 2020-06-22 MED ORDER — TRULICITY 0.75 MG/0.5ML ~~LOC~~ SOAJ: 0.7500 mg | SUBCUTANEOUS | 3 refills | Status: DC

## 2020-06-22 NOTE — Telephone Encounter (Signed)
[facility] pharmacy called and states the Trulicity pen can not be divided per pen. It comes in 0.75 mg/ml, 1.5 mg/ml, 3 mg/ml and 4.5 mg/ml. It will have to be sent in for one of the strengths they have or a combination of two pens which will require a prior authorization. Such as a total of 2.25 mg would be a 0.75 mg/ml pen and a 1.5 mg/ml pen. Please advise.

## 2020-06-22 NOTE — Telephone Encounter (Signed)
New prescription sent.  I think this was just a typo corrected.

## 2020-07-05 NOTE — Telephone Encounter (Signed)
Prior colonoscopy report received the following:  -Colonoscopy (01/04/2015): 2 subcentimeter tubular adenomas, both removed.  Grade 2 internal hemorrhoids.  Recommended repeat in 5 years.    Based on previous findings and previous recommendation, ok to schedule for colonoscopy with me for ongoing polyp surveillance.  Thank you.

## 2020-07-11 ENCOUNTER — Encounter: Payer: Self-pay | Admitting: Gastroenterology

## 2020-07-18 DIAGNOSIS — Z20828 Contact with and (suspected) exposure to other viral communicable diseases: Secondary | ICD-10-CM | POA: Diagnosis not present

## 2020-07-27 ENCOUNTER — Ambulatory Visit (HOSPITAL_BASED_OUTPATIENT_CLINIC_OR_DEPARTMENT_OTHER): Payer: PPO

## 2020-07-29 DIAGNOSIS — Z20828 Contact with and (suspected) exposure to other viral communicable diseases: Secondary | ICD-10-CM | POA: Diagnosis not present

## 2020-08-09 ENCOUNTER — Other Ambulatory Visit: Payer: Self-pay | Admitting: Adult Health

## 2020-08-10 ENCOUNTER — Telehealth: Payer: Self-pay | Admitting: Adult Health

## 2020-08-10 MED ORDER — BREO ELLIPTA 200-25 MCG/INH IN AEPB: 1.0000 | INHALATION_SPRAY | Freq: Every day | RESPIRATORY_TRACT | 0 refills | Status: DC

## 2020-08-10 NOTE — Telephone Encounter (Signed)
Spoke with the pt  Rx for Breo refilled  Advised pt to keep the upcoming appt she has with TP for 08/16/20

## 2020-08-16 ENCOUNTER — Ambulatory Visit (INDEPENDENT_AMBULATORY_CARE_PROVIDER_SITE_OTHER): Payer: PPO

## 2020-08-16 ENCOUNTER — Ambulatory Visit: Payer: PPO | Admitting: Adult Health

## 2020-08-16 ENCOUNTER — Encounter: Payer: Self-pay | Admitting: Adult Health

## 2020-08-16 ENCOUNTER — Other Ambulatory Visit: Payer: Self-pay

## 2020-08-16 VITALS — BP 140/76 | HR 92 | Temp 98.2°F | Ht 63.0 in | Wt 190.2 lb

## 2020-08-16 DIAGNOSIS — J3089 Other allergic rhinitis: Secondary | ICD-10-CM

## 2020-08-16 DIAGNOSIS — J4520 Mild intermittent asthma, uncomplicated: Secondary | ICD-10-CM

## 2020-08-16 DIAGNOSIS — J209 Acute bronchitis, unspecified: Secondary | ICD-10-CM | POA: Diagnosis not present

## 2020-08-16 DIAGNOSIS — J4550 Severe persistent asthma, uncomplicated: Secondary | ICD-10-CM

## 2020-08-16 DIAGNOSIS — J45909 Unspecified asthma, uncomplicated: Secondary | ICD-10-CM | POA: Diagnosis not present

## 2020-08-16 MED ORDER — AZITHROMYCIN 250 MG PO TABS
ORAL_TABLET | ORAL | 0 refills | Status: AC
Start: 2020-08-16 — End: 2020-08-21

## 2020-08-16 NOTE — Patient Instructions (Addendum)
Chest xray today  Z-Pack to have on hold if symptoms worsen with discolored mucus . (hold colchicine while taking )  Mucinex DM Twice daily  As needed  Cough/congestion  Continue on Breo 200 1 puff daily, rinse after use Continue on Spiriva 2 puffs daily, rinse after use  Xyzal daily Continue on Flonase 2 puffs daily Continue on Singulair daily.  Continue on with sinus rinses.  Follow-up with Dr. Isaiah Serge  or Brilyn Tuller NP in 3-4  Months with pre/post spirometry  and As needed   Please contact office for sooner follow up if symptoms do not improve or worsen or seek emergency care

## 2020-08-16 NOTE — Progress Notes (Signed)
_0  ID: Tara Campbell, female    DOB: [birth year], 74 y.o.   MRN: [id number]  Chief Complaint  Patient presents with  . Follow-up    Asthma    Referring provider: Hali Marry, *  HPI: 74 year old female never smoker seen for pulmonary consult January 2019 for severe persistent asthma -allergic phenotype and allergic rhinitis. Medical history significant for nasal polyposis and chronic sinusitis  TEST/EVENTS :  CT chest Apr 22, 2019 showed no evidence of mediastinal adenopathy, hepatic steatosis and coronary artery disease  08/16/2020 Follow up : Asthma  Patient presents for a follow-up for severe persistent asthma with allergic phenotype.  Patient says overall her asthma has been doing well.  She has been following with an allergist and is currently on allergy drops which she says has helped her immensely.  She has been on no steroids for greater than 1.5 years.  She does complain over the last month that she has had cough and congestion.  She has tested for Covid x2 with negative results.  She denies any chest pain orthopnea PND fever or loss of taste or smell. She remains on Breo and Spiriva.  She is on Singulair and Xyzal along with Flonase.    Allergies  Allergen Reactions  . Latex   . Sulfa Antibiotics Rash    Immunization History  Administered Date(s) Administered  . Fluad Quad(high Dose 65+) 09/22/2019  . Influenza, High Dose Seasonal PF 08/29/2017, 09/07/2018, 08/13/2020  . PFIZER SARS-COV-2 Vaccination 12/13/2019, 01/04/2020  . Pneumococcal Conjugate-13 11/16/2019  . Pneumococcal Polysaccharide-23 08/29/2017  . Tdap 05/29/2017  . Zoster Recombinat (Shingrix) 02/28/2018, 06/12/2018    Past Medical History:  Diagnosis Date  . Allergy   . Asthma   . Colon polyps   . Diabetes mellitus without complication ([place])   . Gout   . Heart attack ([place]) 07/30/2016   PCI, 2 stents  . Hyperlipidemia   . Hypertension   . Internal hemorrhoids   . Left  rotator cuff tear   . 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 Otomycosis of right ear 09/27/2017  . Paroxysmal atrial fibrillation ([facility])   . Thyroid disease     Tobacco History: Social History   Tobacco Use  Smoking Status Never Smoker  Smokeless Tobacco Never Used   Counseling given: Not Answered   Outpatient Medications Prior to Visit  Medication Sig Dispense Refill  . albuterol (PROVENTIL) (2.5 MG/3ML) 0.083% nebulizer solution Take 3 mLs (2.5 mg total) by nebulization every 6 (six) hours as needed for wheezing or shortness of breath. 75 mL 12  . albuterol (VENTOLIN HFA) 108 (90 Base) MCG/ACT inhaler Inhale 2 puffs into the lungs every 6 (six) hours as needed for wheezing. 18 g prn  . allopurinol (ZYLOPRIM) 300 MG tablet Take 1 tablet (300 mg total) by mouth 2 (two) times daily. 180 tablet 1  . apixaban (ELIQUIS) 5 MG TABS tablet Take 1 tablet (5 mg total) by mouth 2 (two) times daily. 180 tablet 1  . atenolol (TENORMIN) 100 MG tablet Take 1 tablet (100 mg total) by mouth daily. 90 tablet 1  . atorvastatin (LIPITOR) 80 MG tablet Take 1 tablet (80 mg total) by mouth at bedtime. 90 tablet 3  . blood glucose meter kit and supplies KIT Check morning fasting blood glucose daily and up to 4 times daily as directed 1 each 0  . COLCRYS 0.6 MG tablet TAKE 1 TABLET BY MOUTH  DAILY AS NEEDED 30 tablet 11  . Cyanocobalamin (  B-12 PO) Take 1,000 mg by mouth daily.    . Dulaglutide (TRULICITY) 3.87 FI/4.3PI SOPN Inject 0.5 mLs (0.75 mg total) into the skin once a week. 4 pen 3  . fluticasone (FLONASE) 50 MCG/ACT nasal spray Place 1 spray into both nostrils daily. 16 g 3  . fluticasone furoate-vilanterol (BREO ELLIPTA) 200-25 MCG/INH AEPB Inhale 1 puff into the lungs daily. Needs appt for further refills 60 each 0  . glucose blood test strip To be used twice daily for testing blood sugars. E11.65 200 each 11  . ipratropium-albuterol (DUONEB) 0.5-2.5 (3) MG/3ML SOLN Take 3 mLs by nebulization every 6  (six) hours as needed. 3 mL PRN  . irbesartan-hydrochlorothiazide (AVALIDE) 300-12.5 MG tablet Take 1 tablet by mouth daily. 90 tablet 1  . levalbuterol (XOPENEX HFA) 45 MCG/ACT inhaler Inhale 1-2 puffs into the lungs every 6 (six) hours as needed for wheezing or shortness of breath.    . levocetirizine (XYZAL ALLERGY 24HR) 5 MG tablet Take 1 tablet (5 mg total) by mouth every evening. 90 tablet 3  . levothyroxine (SYNTHROID) 112 MCG tablet Take 1 tablet (112 mcg total) by mouth daily before breakfast. 90 tablet 1  . metFORMIN (GLUCOPHAGE) 1000 MG tablet Take 1 tablet (1,000 mg total) by mouth 2 (two) times daily with a meal. 180 tablet 3  . montelukast (SINGULAIR) 10 MG tablet Take 1 tablet (10 mg total) by mouth at bedtime. 90 tablet 3  . nitroGLYCERIN (NITROSTAT) 0.4 MG SL tablet Place 1 tablet (0.4 mg total) under the tongue every 5 (five) minutes as needed for chest pain (or tightness). 30 tablet 0  . Omega-3 Fatty Acids (FISH OIL PO) Take 5,000 Units by mouth daily.    Marland Kitchen SPIRIVA RESPIMAT 2.5 MCG/ACT AERS Inhale 2 puffs into the lungs daily. 4 g 5   No facility-administered medications prior to visit.     Review of Systems:   Constitutional:   No  weight loss, night sweats,  Fevers, chills,  +fatigue, or  lassitude.  HEENT:   No headaches,  Difficulty swallowing,  Tooth/dental problems, or  Sore throat,                No sneezing, itching, ear ache,  +nasal congestion, post nasal drip,   CV:  No chest pain,  Orthopnea, PND, swelling in lower extremities, anasarca, dizziness, palpitations, syncope.   GI  No heartburn, indigestion, abdominal pain, nausea, vomiting, diarrhea, change in bowel habits, loss of appetite, bloody stools.   Resp:  .  No chest wall deformity  Skin: no rash or lesions.  GU: no dysuria, change in color of urine, no urgency or frequency.  No flank pain, no hematuria   MS:  No joint pain or swelling.  No decreased range of motion.  No back  pain.    Physical Exam  BP 140/76 (BP Location: Left Arm, Cuff Size: Normal)   Pulse 92   Temp 98.2 F (36.8 C) (Temporal)   Ht 5' 3" (1.6 m)   Wt 190 lb 3.2 oz (86.3 kg)   SpO2 96% Comment: RA  BMI 33.69 kg/m   GEN: A/Ox3; pleasant , NAD, well nourished    HEENT:  /AT,   NOSE-clear, THROAT-clear, no lesions, no postnasal drip or exudate noted.   NECK:  Supple w/ fair ROM; no JVD; normal carotid impulses w/o bruits; no thyromegaly or nodules palpated; no lymphadenopathy.    RESP  Clear  P & A; w/o, wheezes/ rales/ or rhonchi. no  accessory muscle use, no dullness to percussion  CARD:  RRR, no m/r/g, no peripheral edema, pulses intact, no cyanosis or clubbing.  GI:   Soft & nt; nml bowel sounds; no organomegaly or masses detected.   Musco: Warm bil, no deformities or joint swelling noted.   Neuro: alert, no focal deficits noted.    Skin: Warm, no lesions or rashes    Lab Results:  CBC   BNP No results found for: BNP  ProBNP No results found for: PROBNP  Imaging: DG Chest 2 View  Result Date: 08/16/2020 CLINICAL DATA:  Asthma. EXAM: CHEST - 2 VIEW COMPARISON:  Two-view chest x-ray 08/25/2018. FINDINGS: Fall heart size normal. Lungs are clear. No edema or effusion is present. No focal airspace disease present. Degenerative changes are noted in the shoulders. IMPRESSION: No acute cardiopulmonary disease or significant interval change. Electronically Signed   By: San Morelle M.D.   On: 08/16/2020 12:47      PFT Results Latest Ref Rng & Units 01/30/2018  FVC-Pre L 1.74  FVC-Predicted Pre % 60  FVC-Post L 1.80  FVC-Predicted Post % 63  Pre FEV1/FVC % % 67  Post FEV1/FCV % % 69  FEV1-Pre L 1.17  FEV1-Predicted Pre % 54  FEV1-Post L 1.24  DLCO uncorrected ml/min/mmHg 20.06  DLCO UNC% % 82  DLCO corrected ml/min/mmHg 20.45  DLCO COR %Predicted % 84  DLVA Predicted % 118  TLC L 4.04  TLC % Predicted % 80  RV % Predicted % 87    Lab Results   Component Value Date   NITRICOXIDE 49 11/14/2018        Assessment & Plan:   Mild intermittent asthma Appears to be under control.  Continue on current regimen.  We did discuss possibly de-escalating her asthma regimen as she has been doing exceptionally well over the last 1.5 years.  Today currently has some cough which may be a lingering bronchitis.  I have asked her that we would repeat her pulmonary function testing on return.  If still doing well.  Can consider discontinuing Spiriva  Plan  Patient Instructions  Chest xray today  Z-Pack to have on hold if symptoms worsen with discolored mucus . (hold colchicine while taking )  Mucinex DM Twice daily  As needed  Cough/congestion  Continue on Breo 200 1 puff daily, rinse after use Continue on Spiriva 2 puffs daily, rinse after use  Xyzal daily Continue on Flonase 2 puffs daily Continue on Singulair daily.  Continue on with sinus rinses.  Follow-up with Dr. Vaughan Browner  or Parrett NP in 3-4  Months with pre/post spirometry  and As needed   Please contact office for sooner follow up if symptoms do not improve or worsen or seek emergency care       Acute bronchitis Ongoing cough with possible underlying bronchitis.  Covid test negative x2. Will check chest x-ray today, if symptoms not improve advised that she can take a Z-Pak  Plan  Patient Instructions  Chest xray today  Z-Pack to have on hold if symptoms worsen with discolored mucus . (hold colchicine while taking )  Mucinex DM Twice daily  As needed  Cough/congestion  Continue on Breo 200 1 puff daily, rinse after use Continue on Spiriva 2 puffs daily, rinse after use  Xyzal daily Continue on Flonase 2 puffs daily Continue on Singulair daily.  Continue on with sinus rinses.  Follow-up with Dr. Vaughan Browner  or Parrett NP in 3-4  Months with pre/post spirometry  and As needed   Please contact office for sooner follow up if symptoms do not improve or worsen or seek emergency care        Non-seasonal allergic rhinitis Continue on maintenance allergy medicines     Maddux Vanscyoc, NP 08/16/2020

## 2020-08-16 NOTE — Assessment & Plan Note (Signed)
Appears to be under control.  Continue on current regimen.  We did discuss possibly de-escalating her asthma regimen as she has been doing exceptionally well over the last 1.5 years.  Today currently has some cough which may be a lingering bronchitis.  I have asked her that we would repeat her pulmonary function testing on return.  If still doing well.  Can consider discontinuing Spiriva  Plan  Patient Instructions  Chest xray today  Z-Pack to have on hold if symptoms worsen with discolored mucus . (hold colchicine while taking )  Mucinex DM Twice daily  As needed  Cough/congestion  Continue on Breo 200 1 puff daily, rinse after use Continue on Spiriva 2 puffs daily, rinse after use  Xyzal daily Continue on Flonase 2 puffs daily Continue on Singulair daily.  Continue on with sinus rinses.  Follow-up with Dr. Isaiah Serge  or Alif Petrak NP in 3-4  Months with pre/post spirometry  and As needed   Please contact office for sooner follow up if symptoms do not improve or worsen or seek emergency care

## 2020-08-16 NOTE — Assessment & Plan Note (Signed)
Continue on maintenance allergy medicines

## 2020-08-16 NOTE — Assessment & Plan Note (Signed)
Ongoing cough with possible underlying bronchitis.  Covid test negative x2. Will check chest x-ray today, if symptoms not improve advised that she can take a Z-Pak  Plan  Patient Instructions  Chest xray today  Z-Pack to have on hold if symptoms worsen with discolored mucus . (hold colchicine while taking )  Mucinex DM Twice daily  As needed  Cough/congestion  Continue on Breo 200 1 puff daily, rinse after use Continue on Spiriva 2 puffs daily, rinse after use  Xyzal daily Continue on Flonase 2 puffs daily Continue on Singulair daily.  Continue on with sinus rinses.  Follow-up with Dr. Isaiah Serge  or Dezaray Shibuya NP in 3-4  Months with pre/post spirometry  and As needed   Please contact office for sooner follow up if symptoms do not improve or worsen or seek emergency care

## 2020-08-22 ENCOUNTER — Ambulatory Visit (HOSPITAL_BASED_OUTPATIENT_CLINIC_OR_DEPARTMENT_OTHER)
Admission: RE | Admit: 2020-08-22 | Discharge: 2020-08-22 | Disposition: A | Discharge status: Home / Self Care | Payer: PPO | Source: Ambulatory Visit | Attending: Cardiology | Admitting: Cardiology

## 2020-08-22 DIAGNOSIS — I480 Paroxysmal atrial fibrillation: Secondary | ICD-10-CM | POA: Insufficient documentation

## 2020-08-22 LAB — ECHOCARDIOGRAM COMPLETE
Area-P 1/2: 4.39 cm2
S' Lateral: 2.46 cm

## 2020-08-30 ENCOUNTER — Telehealth: Payer: Self-pay

## 2020-08-30 ENCOUNTER — Encounter: Payer: Self-pay | Admitting: Gastroenterology

## 2020-08-30 ENCOUNTER — Ambulatory Visit: Payer: PPO | Admitting: Gastroenterology

## 2020-08-30 VITALS — BP 150/68 | HR 101 | Ht 64.0 in | Wt 190.5 lb

## 2020-08-30 DIAGNOSIS — I2510 Atherosclerotic heart disease of native coronary artery without angina pectoris: Secondary | ICD-10-CM

## 2020-08-30 DIAGNOSIS — J4520 Mild intermittent asthma, uncomplicated: Secondary | ICD-10-CM

## 2020-08-30 DIAGNOSIS — Z7901 Long term (current) use of anticoagulants: Secondary | ICD-10-CM | POA: Diagnosis not present

## 2020-08-30 DIAGNOSIS — Z86010 Personal history of colonic polyps: Secondary | ICD-10-CM | POA: Diagnosis not present

## 2020-08-30 DIAGNOSIS — I4891 Unspecified atrial fibrillation: Secondary | ICD-10-CM

## 2020-08-30 MED ORDER — CLENPIQ 10-3.5-12 MG-GM -GM/160ML PO SOLN: 1.0000 | ORAL | 0 refills | Status: DC

## 2020-08-30 NOTE — Progress Notes (Signed)
Chief Complaint: Polyp surveillance  Referring Provider:     Hali Marry, MD   HPI:     Tara Campbell is a 74 y.o. female with a hx of asthma, allergic rhinitis,  DM, HTN, HLD, paroxysmal Afib (on Eliquis), gout, CAD with PCI in 07/2016 with stents x2, with  a hx of colon polyps, [facility] for evaluation of repeat colonsocopy for ongoing polyp surveillance.  She is otherwise without any active GI symptoms or complaints today.  Takes Eliquis. Not taking any additional antiplatelet therapy.    Also hx of hemorrhoids, s/p banding in 2016 with resolution of hemorrhoidal symptoms.   Endoscopic Hx: -Colonoscopy (01/04/2015): 2 subcentimeter tubular adenomas, both removed.  Grade 2 internal hemorrhoids.  Recommended repeat in 5 years.  - Prior to that, colonoscopy x3 all with polyps as well   - Hemorrhoid banding 2016 at [facility], [place], [place]    Past Medical History:  Diagnosis Date  . Allergy   . Asthma   . Colon polyps   . Diabetes mellitus without complication ([place])   . Gout   . Heart attack ([place]) 07/30/2016   PCI, 2 stents  . Hyperlipidemia   . Hypertension   . Internal hemorrhoids   . Left rotator cuff tear   . 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 Otomycosis of right ear 09/27/2017  . Paroxysmal atrial fibrillation ([facility])   . Thyroid disease      Past Surgical History:  Procedure Laterality Date  . ANGIOPLASTY    . COLONOSCOPY W/ POLYPECTOMY  01/04/2015  . HEMORRHOID BANDING     Family History  Problem Relation Age of Onset  . Hypertension Mother   . Hyperlipidemia Mother   . Glaucoma Father   . Heart disease Father   . Hypertension Father   . Diabetes Father   . Hyperlipidemia Father   . Skin cancer Father   . Diabetes Paternal Grandmother   . Colon cancer Neg Hx   . Esophageal cancer Neg Hx    Social History   Tobacco Use  . Smoking status: Never Smoker  . Smokeless tobacco: Never  Used  Vaping Use  . Vaping Use: Never used  Substance Use Topics  . Alcohol use: Yes    Comment: Rare  . Drug use: No   Current Outpatient Medications  Medication Sig Dispense Refill  . albuterol (PROVENTIL) (2.5 MG/3ML) 0.083% nebulizer solution Take 3 mLs (2.5 mg total) by nebulization every 6 (six) hours as needed for wheezing or shortness of breath. 75 mL 12  . albuterol (VENTOLIN HFA) 108 (90 Base) MCG/ACT inhaler Inhale 2 puffs into the lungs every 6 (six) hours as needed for wheezing. 18 g prn  . allopurinol (ZYLOPRIM) 300 MG tablet Take 1 tablet (300 mg total) by mouth 2 (two) times daily. 180 tablet 1  . apixaban (ELIQUIS) 5 MG TABS tablet Take 1 tablet (5 mg total) by mouth 2 (two) times daily. 180 tablet 1  . atenolol (TENORMIN) 100 MG tablet Take 1 tablet (100 mg total) by mouth daily. 90 tablet 1  . atorvastatin (LIPITOR) 80 MG tablet Take 1 tablet (80 mg total) by mouth at bedtime. 90 tablet 3  . blood glucose meter kit and supplies KIT Check morning fasting blood glucose daily and up to 4 times daily as directed 1 each 0  . COLCRYS 0.6 MG tablet TAKE 1 TABLET BY  MOUTH  DAILY AS NEEDED 30 tablet 11  . Cyanocobalamin (B-12 PO) Take 1,000 mg by mouth daily.    . Dulaglutide (TRULICITY) 6.30 ZS/0.1UX SOPN Inject 0.5 mLs (0.75 mg total) into the skin once a week. 4 pen 3  . fluticasone (FLONASE) 50 MCG/ACT nasal spray Place 1 spray into both nostrils daily. 16 g 3  . fluticasone furoate-vilanterol (BREO ELLIPTA) 200-25 MCG/INH AEPB Inhale 1 puff into the lungs daily. Needs appt for further refills 60 each 0  . glucose blood test strip To be used twice daily for testing blood sugars. E11.65 200 each 11  . ipratropium-albuterol (DUONEB) 0.5-2.5 (3) MG/3ML SOLN Take 3 mLs by nebulization every 6 (six) hours as needed. 3 mL PRN  . irbesartan-hydrochlorothiazide (AVALIDE) 300-12.5 MG tablet Take 1 tablet by mouth daily. 90 tablet 1  . levocetirizine (XYZAL ALLERGY 24HR) 5 MG tablet Take  1 tablet (5 mg total) by mouth every evening. 90 tablet 3  . levothyroxine (SYNTHROID) 112 MCG tablet Take 1 tablet (112 mcg total) by mouth daily before breakfast. 90 tablet 1  . metFORMIN (GLUCOPHAGE) 1000 MG tablet Take 1 tablet (1,000 mg total) by mouth 2 (two) times daily with a meal. 180 tablet 3  . montelukast (SINGULAIR) 10 MG tablet Take 1 tablet (10 mg total) by mouth at bedtime. 90 tablet 3  . nitroGLYCERIN (NITROSTAT) 0.4 MG SL tablet Place 1 tablet (0.4 mg total) under the tongue every 5 (five) minutes as needed for chest pain (or tightness). 30 tablet 0  . Omega-3 Fatty Acids (FISH OIL PO) Take 5,000 Units by mouth daily.    Marland Kitchen SPIRIVA RESPIMAT 2.5 MCG/ACT AERS Inhale 2 puffs into the lungs daily. 4 g 5  . levalbuterol (XOPENEX HFA) 45 MCG/ACT inhaler Inhale 1-2 puffs into the lungs every 6 (six) hours as needed for wheezing or shortness of breath.     No current facility-administered medications for this visit.   Allergies  Allergen Reactions  . Latex   . Sulfa Antibiotics Rash     Review of Systems: All systems reviewed and negative except where noted in HPI.     Physical Exam:    Wt Readings from Last 3 Encounters:  08/30/20 190 lb 8 oz (86.4 kg)  08/16/20 190 lb 3.2 oz (86.3 kg)  06/20/20 188 lb (85.3 kg)    BP (!) 150/68   Pulse (!) 101   Ht $R'5\' 4"'dz$  (1.626 m)   Wt 190 lb 8 oz (86.4 kg)   BMI 32.70 kg/m  Constitutional:  Pleasant, in no acute distress. Psychiatric: Normal mood and affect. Behavior is normal. EENT: Pupils normal.  Conjunctivae are normal. No scleral icterus. Neck supple. No cervical LAD. Cardiovascular: Normal rate, regular rhythm. No edema Pulmonary/chest: Effort normal and breath sounds normal. No wheezing, rales or rhonchi. Abdominal: Soft, nondistended, nontender. Bowel sounds active throughout. There are no masses palpable. No hepatomegaly. Neurological: Alert and oriented to person place and time. Skin: Skin is warm and dry. No rashes  noted.   ASSESSMENT AND PLAN;   1) History of colon polyps -History of colon polyps on colonoscopy x4 in the past, including 2 tubular adenomas in 2060.  Due for repeat colonoscopy for ongoing polyp surveillance -Schedule for colonoscopy  2) History of internal hemorrhoids -Resolution of hemorrhoidal symptoms with hemorrhoid banding in 2016  3) History of atrial fibrillation 4) Systemic anticoagulation Hold Eliquis 2 days before procedure - will instruct when and how to resume after procedure. Low but real  risk of cardiovascular event such as heart attack, stroke, embolism, thrombosis or ischemia/infarct of other organs off Eliquis explained and need to seek urgent help if this occurs. The patient consents to proceed. Will communicate by phone or EMR with patient's prescribing provider to confirm that holding Eliquis is reasonable in this case  5) History of asthma -Continue medications as prescribed, including breathing treatment morning of procedure.  Will bring albuterol inhaler with her   Lavena Bullion, DO, FACG  08/30/2020, 11:21 AM   Hali Marry, *

## 2020-08-30 NOTE — Telephone Encounter (Signed)
Patient with diagnosis of A Fib on Eliquis for anticoagulation.    Procedure: Endoscopy Date of procedure: 09/19/20  CHADS2-VASc score of  6 (HTN, AGE x 2, DM2, CAD, female)  CrCl 63.64 mL/min using adjusted body weight Platelet count 401K  Per office protocol, patient can hold Eliquis for 2 days prior to procedure.

## 2020-08-30 NOTE — Telephone Encounter (Signed)
[facility] Pre-operative Risk Assessment     Request for surgical clearance:     Endoscopy Procedure  What type of surgery is being performed?     Colonoscopy  When is this surgery scheduled?     09/19/20  What type of clearance is required ?   Pharmacy  Are there any medications that need to be held prior to surgery and how long? HOLD Eliquis for 2 days prior  Practice name and name of physician performing surgery?      [facility]/Dr. Bryan Lemma  What is your office phone and fax number?      Phone- [phone]  Fax- [phone] Attn: Peter Congo, RMA  Anesthesia type (None, local, MAC, general) ?       MAC

## 2020-08-30 NOTE — Patient Instructions (Signed)
If you are age 76 or older, your body mass index should be between 23-30. Your Body mass index is 32.7 kg/m. If this is out of the aforementioned range listed, please consider follow up with your Primary Care Provider.  If you are age 69 or younger, your body mass index should be between 19-25. Your Body mass index is 32.7 kg/m. If this is out of the aformentioned range listed, please consider follow up with your Primary Care Provider.   You have been scheduled for a colonoscopy. Please follow written instructions given to you at your visit today.  Please pick up your prep supplies at the pharmacy within the next 1-3 days. If you use inhalers (even only as needed), please bring them with you on the day of your procedure.  We have sent the following medications to your pharmacy for you to pick up at your convenience: Clenpiq  You will be contacted by our office prior to your procedure for directions on holding your Eliquis.  If you do not hear from our office 1 week prior to your scheduled procedure, please call [phone] to discuss.    It was a pleasure to see you today!  Vito Cirigliano, D.O.

## 2020-08-30 NOTE — Telephone Encounter (Signed)
Can you please comment on eliquis? 

## 2020-08-31 NOTE — Telephone Encounter (Signed)
Spoke with patient this morning.  Pt advised to hold Eliquis two days prior to procedure per Cardiology.  Patient verbalized  Understanding.

## 2020-09-05 ENCOUNTER — Encounter: Payer: Self-pay | Admitting: Gastroenterology

## 2020-09-19 ENCOUNTER — Encounter: Payer: PPO | Admitting: Gastroenterology

## 2020-09-24 DIAGNOSIS — E079 Disorder of thyroid, unspecified: Secondary | ICD-10-CM | POA: Diagnosis not present

## 2020-09-24 DIAGNOSIS — E119 Type 2 diabetes mellitus without complications: Secondary | ICD-10-CM | POA: Diagnosis not present

## 2020-09-24 DIAGNOSIS — I2510 Atherosclerotic heart disease of native coronary artery without angina pectoris: Secondary | ICD-10-CM | POA: Diagnosis not present

## 2020-09-24 DIAGNOSIS — Z91040 Latex allergy status: Secondary | ICD-10-CM | POA: Diagnosis not present

## 2020-09-24 DIAGNOSIS — Z882 Allergy status to sulfonamides status: Secondary | ICD-10-CM | POA: Diagnosis not present

## 2020-09-24 DIAGNOSIS — I252 Old myocardial infarction: Secondary | ICD-10-CM | POA: Diagnosis not present

## 2020-09-24 DIAGNOSIS — R0602 Shortness of breath: Secondary | ICD-10-CM | POA: Diagnosis not present

## 2020-09-24 DIAGNOSIS — R000 Tachycardia, unspecified: Secondary | ICD-10-CM | POA: Diagnosis not present

## 2020-09-24 DIAGNOSIS — I10 Essential (primary) hypertension: Secondary | ICD-10-CM | POA: Diagnosis not present

## 2020-09-24 DIAGNOSIS — R069 Unspecified abnormalities of breathing: Secondary | ICD-10-CM | POA: Diagnosis not present

## 2020-09-24 DIAGNOSIS — R062 Wheezing: Secondary | ICD-10-CM | POA: Diagnosis not present

## 2020-09-24 DIAGNOSIS — J4541 Moderate persistent asthma with (acute) exacerbation: Secondary | ICD-10-CM | POA: Diagnosis not present

## 2020-09-24 DIAGNOSIS — Z20822 Contact with and (suspected) exposure to covid-19: Secondary | ICD-10-CM | POA: Diagnosis not present

## 2020-09-28 ENCOUNTER — Encounter: Payer: Self-pay | Admitting: Adult Health

## 2020-09-28 ENCOUNTER — Other Ambulatory Visit: Payer: Self-pay

## 2020-09-28 ENCOUNTER — Encounter: Payer: PPO | Admitting: Gastroenterology

## 2020-09-28 ENCOUNTER — Ambulatory Visit: Payer: PPO | Admitting: Adult Health

## 2020-09-28 DIAGNOSIS — J4540 Moderate persistent asthma, uncomplicated: Secondary | ICD-10-CM

## 2020-09-28 DIAGNOSIS — J45909 Unspecified asthma, uncomplicated: Secondary | ICD-10-CM | POA: Insufficient documentation

## 2020-09-28 DIAGNOSIS — J3089 Other allergic rhinitis: Secondary | ICD-10-CM | POA: Diagnosis not present

## 2020-09-28 MED ORDER — BREO ELLIPTA 200-25 MCG/INH IN AEPB: 1.0000 | INHALATION_SPRAY | Freq: Every day | RESPIRATORY_TRACT | 6 refills | Status: DC

## 2020-09-28 NOTE — Patient Instructions (Addendum)
Continue on Breo 200 1 puff daily, rinse after use Continue on Spiriva 2 puffs daily, rinse after use  Mucinex DM Twice daily  As needed  Cough/congestion  Finish Prednisone as directed.  Continue on  Xyzal daily Continue on Flonase 2 puffs daily Continue on Singulair daily.  Continue on with sinus rinses.  Follow-up with Dr. Isaiah Serge  or Orissa Arreaga NP in 6 weeks with pre/post spirometry  and As needed   Please contact office for sooner follow up if symptoms do not improve or worsen or seek emergency care

## 2020-09-28 NOTE — Assessment & Plan Note (Signed)
Continue with trigger prevention and current maintenance regimen

## 2020-09-28 NOTE — Addendum Note (Signed)
Addended by: Charlott Holler on: 09/28/2020 10:59 AM   Modules accepted: Orders

## 2020-09-28 NOTE — Assessment & Plan Note (Addendum)
Moderate persistent asthma with recent exacerbation.  Now improving after emergency room visit with steroids. Unclear exactly what was her trigger possibly a viral exposure plus or minus tapering down on Spiriva.  Have recommended that she use Spiriva every day along with her Breo. Continue with her allergy regimen Trigger prevention. She is to finish up steroids as directed. She has a follow-up visit in 4 to 6 weeks with spirometry.  Plan  Patient Instructions  Continue on Breo 200 1 puff daily, rinse after use Continue on Spiriva 2 puffs daily, rinse after use  Mucinex DM Twice daily  As needed  Cough/congestion  Finish Prednisone as directed.  Continue on  Xyzal daily Continue on Flonase 2 puffs daily Continue on Singulair daily.  Continue on with sinus rinses.  Follow-up with Dr. Isaiah Serge  or Milanya Sunderland NP in 6 weeks with pre/post spirometry  and As needed   Please contact office for sooner follow up if symptoms do not improve or worsen or seek emergency care

## 2020-09-28 NOTE — Progress Notes (Signed)
'@Patient'  ID: Tara Campbell, female    DOB: [birth year], 74 y.o.   MRN: [id number]  Chief Complaint  Patient presents with  . Follow-up    Asthma     Referring provider: Hali Marry, *  HPI: 74 year old female never smoker seen for pulmonary consult January 2019 for severe persistent asthma with allergic phenotype and allergic rhinitis Medical history significant for nasal polyposis and chronic sinusitis  TEST/EVENTS :  Spirometry December 2019 FEV1 75%, ratio 74, FVC 76%  CT chest Apr 22, 2019 showed no evidence of mediastinal adenopathy, hepatic steatosis and coronary artery disease, no acute process in the lungs  CT sinuses December 09, 2018 showed extensive chronic pansinusitis.  2D echo August 22, 2020 EF 80-03%, grade 1 diastolic dysfunction  49/17/9150 Follow up : Asthma and allergic rhinitis Patient presents for an emergency room follow-up.  Patient recently had an asthma exacerbation was seen in the emergency room September 24, 2020.  Care everywhere notes show chest x-ray was clear.  Patient did have chest tightness.  Cardiac enzymes were negative.  Lab work showed normal CBC except for elevated eosinophils at absolute count of 800, essentially normal CHEM panel.  Blood sugar was elevated at 206.  She was treated with IV steroids and nebulized bronchodilator treatment.  She was discharged on a prednisone taper with 60 mg daily for 5 days.  Since ER visit patient is feeling better .  Cough and wheezing are decreasing. Was starting to slowly taper down on Spiriva . Wonders if this contributed to her flare.  Also exposed to grandson with cold like symptoms . Covid test was neg.  Says she has been doing exceptionally well until this past week. Really feels that her allergy regimen with allergy drops has really been helping her asthma she not been on steroids for greater than a year. She denies any hemoptysis chest pain orthopnea PND or leg swelling.  Allergies   Allergen Reactions  . Latex   . Sulfa Antibiotics Rash    Immunization History  Administered Date(s) Administered  . Fluad Quad(high Dose 65+) 09/22/2019  . Influenza, High Dose Seasonal PF 08/29/2017, 09/07/2018, 08/13/2020  . PFIZER SARS-COV-2 Vaccination 12/13/2019, 01/04/2020, 09/03/2020  . Pneumococcal Conjugate-13 11/16/2019  . Pneumococcal Polysaccharide-23 08/29/2017  . Tdap 05/29/2017  . Zoster Recombinat (Shingrix) 02/28/2018, 06/12/2018    Past Medical History:  Diagnosis Date  . Allergy   . Arthritis   . Asthma   . Colon polyps   . Diabetes mellitus without complication ([place])   . Gout   . Heart attack ([place]) 07/30/2016   PCI, 2 stents  . Hyperlipidemia   . Hypertension   . Internal hemorrhoids   . Left rotator cuff tear   . 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 Otomycosis of right ear 09/27/2017  . Paroxysmal atrial fibrillation (HCC)   . Thyroid disease     Tobacco History: Social History   Tobacco Use  Smoking Status Never Smoker  Smokeless Tobacco Never Used   Counseling given: Not Answered   Outpatient Medications Prior to Visit  Medication Sig Dispense Refill  . albuterol (PROVENTIL) (2.5 MG/3ML) 0.083% nebulizer solution Take 3 mLs (2.5 mg total) by nebulization every 6 (six) hours as needed for wheezing or shortness of breath. 75 mL 12  . albuterol (VENTOLIN HFA) 108 (90 Base) MCG/ACT inhaler Inhale 2 puffs into the lungs every 6 (six) hours as needed for wheezing. 18 g prn  . allopurinol (ZYLOPRIM) 300 MG tablet Take 1  tablet (300 mg total) by mouth 2 (two) times daily. 180 tablet 1  . apixaban (ELIQUIS) 5 MG TABS tablet Take 1 tablet (5 mg total) by mouth 2 (two) times daily. 180 tablet 1  . atenolol (TENORMIN) 100 MG tablet Take 1 tablet (100 mg total) by mouth daily. 90 tablet 1  . atorvastatin (LIPITOR) 80 MG tablet Take 1 tablet (80 mg total) by mouth at bedtime. 90 tablet 3  . blood glucose meter kit and supplies KIT Check morning fasting  blood glucose daily and up to 4 times daily as directed 1 each 0  . COLCRYS 0.6 MG tablet TAKE 1 TABLET BY MOUTH  DAILY AS NEEDED 30 tablet 11  . Cyanocobalamin (B-12 PO) Take 1,000 mg by mouth daily.    . Dulaglutide (TRULICITY) 5.46 TK/3.5WS SOPN Inject 0.5 mLs (0.75 mg total) into the skin once a week. 4 pen 3  . fluticasone (FLONASE) 50 MCG/ACT nasal spray Place 1 spray into both nostrils daily. 16 g 3  . fluticasone furoate-vilanterol (BREO ELLIPTA) 200-25 MCG/INH AEPB Inhale 1 puff into the lungs daily. Needs appt for further refills 60 each 0  . glucose blood test strip To be used twice daily for testing blood sugars. E11.65 200 each 11  . ipratropium-albuterol (DUONEB) 0.5-2.5 (3) MG/3ML SOLN Take 3 mLs by nebulization every 6 (six) hours as needed. 3 mL PRN  . irbesartan-hydrochlorothiazide (AVALIDE) 300-12.5 MG tablet Take 1 tablet by mouth daily. 90 tablet 1  . levalbuterol (XOPENEX HFA) 45 MCG/ACT inhaler Inhale 1-2 puffs into the lungs every 6 (six) hours as needed for wheezing or shortness of breath.    . levocetirizine (XYZAL ALLERGY 24HR) 5 MG tablet Take 1 tablet (5 mg total) by mouth every evening. 90 tablet 3  . levothyroxine (SYNTHROID) 112 MCG tablet Take 1 tablet (112 mcg total) by mouth daily before breakfast. 90 tablet 1  . metFORMIN (GLUCOPHAGE) 1000 MG tablet Take 1 tablet (1,000 mg total) by mouth 2 (two) times daily with a meal. 180 tablet 3  . montelukast (SINGULAIR) 10 MG tablet Take 1 tablet (10 mg total) by mouth at bedtime. 90 tablet 3  . nitroGLYCERIN (NITROSTAT) 0.4 MG SL tablet Place 1 tablet (0.4 mg total) under the tongue every 5 (five) minutes as needed for chest pain (or tightness). 30 tablet 0  . Omega-3 Fatty Acids (FISH OIL PO) Take 5,000 Units by mouth daily.    . Sod Picosulfate-Mag Ox-Cit Acd (CLENPIQ) 10-3.5-12 MG-GM -GM/160ML SOLN Take 1 kit by mouth as directed. 320 mL 0  . SPIRIVA RESPIMAT 2.5 MCG/ACT AERS Inhale 2 puffs into the lungs daily. 4 g 5    No facility-administered medications prior to visit.     Review of Systems:   Constitutional:   No  weight loss, night sweats,  Fevers, chills, fatigue, or  lassitude.  HEENT:   No headaches,  Difficulty swallowing,  Tooth/dental problems, or  Sore throat,                No sneezing, itching, ear ache,  +nasal congestion, post nasal drip,   CV:  No chest pain,  Orthopnea, PND, swelling in lower extremities, anasarca, dizziness, palpitations, syncope.   GI  No heartburn, indigestion, abdominal pain, nausea, vomiting, diarrhea, change in bowel habits, loss of appetite, bloody stools.   Resp:   No chest wall deformity  Skin: no rash or lesions.  GU: no dysuria, change in color of urine, no urgency or frequency.  No  flank pain, no hematuria   MS:  No joint pain or swelling.  No decreased range of motion.  No back pain.    Physical Exam  BP 114/64 (BP Location: Right Arm, Cuff Size: Large)   Pulse 92   Temp 97.6 F (36.4 C) (Temporal)   Ht '5\' 4"'  (1.626 m)   Wt 187 lb 9.6 oz (85.1 kg)   SpO2 97%   BMI 32.20 kg/m   GEN: A/Ox3; pleasant , NAD, well nourished    HEENT:  [place]/AT,  , NOSE-clear, THROAT-clear, no lesions, no postnasal drip or exudate noted.   NECK:  Supple w/ fair ROM; no JVD; normal carotid impulses w/o bruits; no thyromegaly or nodules palpated; no lymphadenopathy.    RESP  Clear  P & A; w/o, wheezes/ rales/ or rhonchi. no accessory muscle use, no dullness to percussion  CARD:  RRR, no m/r/g, no peripheral edema, pulses intact, no cyanosis or clubbing.  GI:   Soft & nt; nml bowel sounds; no organomegaly or masses detected.   Musco: Warm bil, no deformities or joint swelling noted.   Neuro: alert, no focal deficits noted.    Skin: Warm, no lesions or rashes    Lab Results:  CBC    Component Value Date/Time   WBC 10.7 06/08/2020 1602   RBC 4.61 06/08/2020 1602   HGB 13.8 06/08/2020 1602   HGB 12.8 12/26/2017 1630   HCT 40.2 06/08/2020 1602    HCT 37.3 12/26/2017 1630   PLT 401 (H) 06/08/2020 1602   MCV 87.2 06/08/2020 1602   MCV 86 12/26/2017 1630   MCH 29.9 06/08/2020 1602   MCHC 34.3 06/08/2020 1602   RDW 12.6 06/08/2020 1602   RDW 13.5 12/26/2017 1630   LYMPHSABS 1,964 08/05/2018 1022   LYMPHSABS 1.8 12/26/2017 1630   MONOABS 1.0 03/07/2018 2159   EOSABS 809 (H) 08/05/2018 1022   EOSABS 1.0 (H) 12/26/2017 1630   BASOSABS 62 08/05/2018 1022   BASOSABS 0.1 12/26/2017 1630    BMET    Component Value Date/Time   NA 141 02/22/2020 1517   K 4.1 02/22/2020 1517   CL 104 02/22/2020 1517   CO2 24 02/22/2020 1517   GLUCOSE 131 02/22/2020 1517   BUN 16 02/22/2020 1517   CREATININE 0.80 02/22/2020 1517   CALCIUM 10.5 (H) 02/22/2020 1517   GFRNONAA 72 02/22/2020 1517   GFRAA 84 02/22/2020 1517    BNP No results found for: BNP  ProBNP No results found for: PROBNP  Imaging: No results found.    PFT Results Latest Ref Rng & Units 01/30/2018  FVC-Pre L 1.74  FVC-Predicted Pre % 60  FVC-Post L 1.80  FVC-Predicted Post % 63  Pre FEV1/FVC % % 67  Post FEV1/FCV % % 69  FEV1-Pre L 1.17  FEV1-Predicted Pre % 54  FEV1-Post L 1.24  DLCO uncorrected ml/min/mmHg 20.06  DLCO UNC% % 82  DLCO corrected ml/min/mmHg 20.45  DLCO COR %Predicted % 84  DLVA Predicted % 118  TLC L 4.04  TLC % Predicted % 80  RV % Predicted % 87    Lab Results  Component Value Date   NITRICOXIDE 49 11/14/2018        Assessment & Plan:   Asthma Moderate persistent asthma with recent exacerbation.  Now improving after emergency room visit with steroids. Unclear exactly what was her trigger possibly a viral exposure plus or minus tapering down on Spiriva.  Have recommended that she use Spiriva every day along with  her Breo. Continue with her allergy regimen Trigger prevention. She is to finish up steroids as directed. She has a follow-up visit in 4 to 6 weeks with spirometry.  Plan  Patient Instructions  Continue on Breo 200 1  puff daily, rinse after use Continue on Spiriva 2 puffs daily, rinse after use  Mucinex DM Twice daily  As needed  Cough/congestion  Finish Prednisone as directed.  Continue on  Xyzal daily Continue on Flonase 2 puffs daily Continue on Singulair daily.  Continue on with sinus rinses.  Follow-up with Dr. Vaughan Browner  or Kourtney Terriquez NP in 6 weeks with pre/post spirometry  and As needed   Please contact office for sooner follow up if symptoms do not improve or worsen or seek emergency care       Non-seasonal allergic rhinitis Continue with trigger prevention and current maintenance regimen     Rexene Edison, NP 09/28/2020

## 2020-10-03 ENCOUNTER — Encounter: Payer: Self-pay | Admitting: Family Medicine

## 2020-10-03 ENCOUNTER — Other Ambulatory Visit: Payer: Self-pay

## 2020-10-03 ENCOUNTER — Ambulatory Visit (INDEPENDENT_AMBULATORY_CARE_PROVIDER_SITE_OTHER): Payer: PPO | Admitting: Family Medicine

## 2020-10-03 VITALS — BP 145/76 | HR 105 | Ht 64.0 in | Wt 191.0 lb

## 2020-10-03 DIAGNOSIS — N1831 Chronic kidney disease, stage 3a: Secondary | ICD-10-CM

## 2020-10-03 DIAGNOSIS — E1122 Type 2 diabetes mellitus with diabetic chronic kidney disease: Secondary | ICD-10-CM

## 2020-10-03 DIAGNOSIS — I10 Essential (primary) hypertension: Secondary | ICD-10-CM | POA: Diagnosis not present

## 2020-10-03 DIAGNOSIS — E039 Hypothyroidism, unspecified: Secondary | ICD-10-CM | POA: Diagnosis not present

## 2020-10-03 DIAGNOSIS — R059 Cough, unspecified: Secondary | ICD-10-CM

## 2020-10-03 DIAGNOSIS — J309 Allergic rhinitis, unspecified: Secondary | ICD-10-CM | POA: Diagnosis not present

## 2020-10-03 DIAGNOSIS — E782 Mixed hyperlipidemia: Secondary | ICD-10-CM

## 2020-10-03 DIAGNOSIS — E1165 Type 2 diabetes mellitus with hyperglycemia: Secondary | ICD-10-CM

## 2020-10-03 DIAGNOSIS — M1A0790 Idiopathic chronic gout, unspecified ankle and foot, without tophus (tophi): Secondary | ICD-10-CM

## 2020-10-03 DIAGNOSIS — J339 Nasal polyp, unspecified: Secondary | ICD-10-CM | POA: Diagnosis not present

## 2020-10-03 DIAGNOSIS — E1169 Type 2 diabetes mellitus with other specified complication: Secondary | ICD-10-CM

## 2020-10-03 DIAGNOSIS — J342 Deviated nasal septum: Secondary | ICD-10-CM | POA: Diagnosis not present

## 2020-10-03 DIAGNOSIS — J329 Chronic sinusitis, unspecified: Secondary | ICD-10-CM | POA: Diagnosis not present

## 2020-10-03 LAB — POCT GLYCOSYLATED HEMOGLOBIN (HGB A1C): Hemoglobin A1C: 7.8 % — AB (ref 4.0–5.6)

## 2020-10-03 MED ORDER — TRULICITY 1.5 MG/0.5ML ~~LOC~~ SOAJ: 1.5000 mg | SUBCUTANEOUS | 5 refills | Status: DC

## 2020-10-03 MED ORDER — AZITHROMYCIN 250 MG PO TABS: ORAL_TABLET | ORAL | 0 refills | Status: AC

## 2020-10-03 NOTE — Assessment & Plan Note (Signed)
BP not at goal.  F/u in 3 months.

## 2020-10-03 NOTE — Assessment & Plan Note (Signed)
Due to recheck uric acid.   ?

## 2020-10-03 NOTE — Progress Notes (Signed)
Established Patient Office Visit  Subjective:  Patient ID: Tara Campbell, female    DOB: 1946/08/30  Age: 74 y.o. MRN: [id number]  CC:  Chief Complaint  Patient presents with  . Diabetes  . Cough    pt reports that she was seen in the ED on 10/23 for asthma excaerbation she did a f/u with pulm on 10/27 and she felt like she needed an ABX because she has a cough and rattle in her chest    HPI Tara Campbell presents for   Diabetes - no hypoglycemic events. No wounds or sores that are not healing well. No increased thirst or urination. Checking glucose at home. Taking medications as prescribed without any side effects.  She admits she has not been doing the best with her diet but also recently had a round of steroids.  F/U Cough -she recently had an asthma exacerbation in the back she actually ended up going to the emergency department a little over a week ago on October 23.  She did follow-up about 3 days later in the pulmonology office.  They decided to continue her Spiriva which they actually have been weaning and continue her Breo.  They also put her on a round of prednisone she says it did help but she still have this persistent deep gravelly cough that is not quite clear.  No colored sputum.  She did spend finish her steroids about 4 days ago.  She feels like the congestion in her chest is a little bit towards the lower part of her chest into the left.  F/U Asthma - she is on Spiriva and Breo.  She is traveling at the end of the week.  She travels with her nebulizer.    Past Medical History:  Diagnosis Date  . Allergy   . Arthritis   . Asthma   . Colon polyps   . Diabetes mellitus without complication ([place])   . Gout   . Heart attack ([place]) 07/30/2016   PCI, 2 stents  . Hyperlipidemia   . Hypertension   . Internal hemorrhoids   . Left rotator cuff tear   . 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 Otomycosis of right ear 09/27/2017  . Paroxysmal atrial fibrillation (HCC)   .  Thyroid disease     Past Surgical History:  Procedure Laterality Date  . ANGIOPLASTY    . COLONOSCOPY W/ POLYPECTOMY  01/04/2015  . HEMORRHOID BANDING      Family History  Problem Relation Age of Onset  . Hypertension Mother   . Hyperlipidemia Mother   . Glaucoma Father   . Heart disease Father   . Hypertension Father   . Diabetes Father   . Hyperlipidemia Father   . Skin cancer Father   . Diabetes Paternal Grandmother   . Colon cancer Neg Hx   . Esophageal cancer Neg Hx     Social History   Socioeconomic History  . Marital status: Divorced    Spouse name: Not on file  . Number of children: 2  . Years of education: Not on file  . Highest education level: Not on file  Occupational History  . Not on file  Tobacco Use  . Smoking status: Never Smoker  . Smokeless tobacco: Never Used  Vaping Use  . Vaping Use: Never used  Substance and Sexual Activity  . Alcohol use: Yes    Comment: Rare  . Drug use: No  . Sexual activity: Not Currently  Other Topics Concern  .  Not on file  Social History Narrative  . Not on file   Social Determinants of Health   Financial Resource Strain:   . Difficulty of Paying Living Expenses: Not on file  Food Insecurity:   . Worried About Charity fundraiser in the Last Year: Not on file  . Ran Out of Food in the Last Year: Not on file  Transportation Needs:   . Lack of Transportation (Medical): Not on file  . Lack of Transportation (Non-Medical): Not on file  Physical Activity:   . Days of Exercise per Week: Not on file  . Minutes of Exercise per Session: Not on file  Stress:   . Feeling of Stress : Not on file  Social Connections:   . Frequency of Communication with Friends and Family: Not on file  . Frequency of Social Gatherings with Friends and Family: Not on file  . Attends Religious Services: Not on file  . Active Member of Clubs or Organizations: Not on file  . Attends Archivist Meetings: Not on file  .  Marital Status: Not on file  Intimate Partner Violence:   . Fear of Current or Ex-Partner: Not on file  . Emotionally Abused: Not on file  . Physically Abused: Not on file  . Sexually Abused: Not on file    Outpatient Medications Prior to Visit  Medication Sig Dispense Refill  . albuterol (PROVENTIL) (2.5 MG/3ML) 0.083% nebulizer solution Take 3 mLs (2.5 mg total) by nebulization every 6 (six) hours as needed for wheezing or shortness of breath. 75 mL 12  . albuterol (VENTOLIN HFA) 108 (90 Base) MCG/ACT inhaler Inhale 2 puffs into the lungs every 6 (six) hours as needed for wheezing. 18 g prn  . allopurinol (ZYLOPRIM) 300 MG tablet Take 1 tablet (300 mg total) by mouth 2 (two) times daily. 180 tablet 1  . apixaban (ELIQUIS) 5 MG TABS tablet Take 1 tablet (5 mg total) by mouth 2 (two) times daily. 180 tablet 1  . atenolol (TENORMIN) 100 MG tablet Take 1 tablet (100 mg total) by mouth daily. 90 tablet 1  . atorvastatin (LIPITOR) 80 MG tablet Take 1 tablet (80 mg total) by mouth at bedtime. 90 tablet 3  . blood glucose meter kit and supplies KIT Check morning fasting blood glucose daily and up to 4 times daily as directed 1 each 0  . COLCRYS 0.6 MG tablet TAKE 1 TABLET BY MOUTH  DAILY AS NEEDED 30 tablet 11  . Cyanocobalamin (B-12 PO) Take 1,000 mg by mouth daily.    . fluticasone (FLONASE) 50 MCG/ACT nasal spray Place 1 spray into both nostrils daily. 16 g 3  . fluticasone furoate-vilanterol (BREO ELLIPTA) 200-25 MCG/INH AEPB Inhale 1 puff into the lungs daily. Needs appt for further refills 60 each 6  . glucose blood test strip To be used twice daily for testing blood sugars. E11.65 200 each 11  . ipratropium-albuterol (DUONEB) 0.5-2.5 (3) MG/3ML SOLN Take 3 mLs by nebulization every 6 (six) hours as needed. 3 mL PRN  . irbesartan-hydrochlorothiazide (AVALIDE) 300-12.5 MG tablet Take 1 tablet by mouth daily. 90 tablet 1  . levalbuterol (XOPENEX HFA) 45 MCG/ACT inhaler Inhale 1-2 puffs into the  lungs every 6 (six) hours as needed for wheezing or shortness of breath.    . levocetirizine (XYZAL ALLERGY 24HR) 5 MG tablet Take 1 tablet (5 mg total) by mouth every evening. 90 tablet 3  . levothyroxine (SYNTHROID) 112 MCG tablet Take 1 tablet (112  mcg total) by mouth daily before breakfast. 90 tablet 1  . metFORMIN (GLUCOPHAGE) 1000 MG tablet Take 1 tablet (1,000 mg total) by mouth 2 (two) times daily with a meal. 180 tablet 3  . montelukast (SINGULAIR) 10 MG tablet Take 1 tablet (10 mg total) by mouth at bedtime. 90 tablet 3  . nitroGLYCERIN (NITROSTAT) 0.4 MG SL tablet Place 1 tablet (0.4 mg total) under the tongue every 5 (five) minutes as needed for chest pain (or tightness). 30 tablet 0  . Omega-3 Fatty Acids (FISH OIL PO) Take 5,000 Units by mouth daily.    . Sod Picosulfate-Mag Ox-Cit Acd (CLENPIQ) 10-3.5-12 MG-GM -GM/160ML SOLN Take 1 kit by mouth as directed. 320 mL 0  . SPIRIVA RESPIMAT 2.5 MCG/ACT AERS Inhale 2 puffs into the lungs daily. 4 g 5  . Dulaglutide (TRULICITY) 3.81 OF/7.5ZW SOPN Inject 0.5 mLs (0.75 mg total) into the skin once a week. 4 pen 3   No facility-administered medications prior to visit.    Allergies  Allergen Reactions  . Latex   . Sulfa Antibiotics Rash    ROS Review of Systems    Objective:    Physical Exam Constitutional:      Appearance: She is well-developed.  HENT:     Head: Normocephalic and atraumatic.  Cardiovascular:     Rate and Rhythm: Normal rate and regular rhythm.     Heart sounds: Normal heart sounds.  Pulmonary:     Effort: Pulmonary effort is normal.     Breath sounds: Normal breath sounds.     Comments: Does have some crackles at the left lung base. Skin:    General: Skin is warm and dry.  Neurological:     Mental Status: She is alert and oriented to person, place, and time.  Psychiatric:        Behavior: Behavior normal.     BP (!) 145/76   Pulse (!) 105   Ht '5\' 4"'  (1.626 m)   Wt 191 lb (86.6 kg)   SpO2 98%    BMI 32.79 kg/m  Wt Readings from Last 3 Encounters:  10/03/20 191 lb (86.6 kg)  09/28/20 187 lb 9.6 oz (85.1 kg)  08/30/20 190 lb 8 oz (86.4 kg)     Health Maintenance Due  Topic Date Due  . OPHTHALMOLOGY EXAM  12/19/2019  . COLONOSCOPY  02/01/2020    There are no preventive care reminders to display for this patient.  Lab Results  Component Value Date   TSH 3.87 10/09/2019   Lab Results  Component Value Date   WBC 10.7 06/08/2020   HGB 13.8 06/08/2020   HCT 40.2 06/08/2020   MCV 87.2 06/08/2020   PLT 401 (H) 06/08/2020   Lab Results  Component Value Date   NA 141 02/22/2020   K 4.1 02/22/2020   CO2 24 02/22/2020   GLUCOSE 131 02/22/2020   BUN 16 02/22/2020   CREATININE 0.80 02/22/2020   BILITOT 0.6 10/09/2019   ALKPHOS 100 05/29/2017   AST 18 10/09/2019   ALT 18 10/09/2019   PROT 6.6 10/09/2019   ALBUMIN 4.2 05/29/2017   CALCIUM 10.5 (H) 02/22/2020   ANIONGAP 12 03/07/2018   Lab Results  Component Value Date   CHOL 290 (H) 10/09/2019   Lab Results  Component Value Date   HDL 49 (L) 10/09/2019   Lab Results  Component Value Date   LDLCALC 186 (H) 10/09/2019   Lab Results  Component Value Date   TRIG 327 (H)  10/09/2019   Lab Results  Component Value Date   CHOLHDL 5.9 (H) 10/09/2019   Lab Results  Component Value Date   HGBA1C 7.8 (A) 10/03/2020      Assessment & Plan:   Problem List Items Addressed This Visit      Cardiovascular and Mediastinum   Hypertension goal BP (blood pressure) < 140/90    BP not at goal.  F/u in 3 months.          Endocrine   Type 2 diabetes mellitus with diabetic chronic kidney disease ([place])    Hemoglobin A1c jumped up to 7.8 she is actually gotten down to 6.9 but admits she has not been doing a great job with her diet but plans on getting back on track.  We will also increase her Trulicity from 7.89 to 1.5 mg.  Follow-up in 3 months.      Relevant Medications   Dulaglutide (TRULICITY) 1.5 BO/4.7QS SOPN    Mixed diabetic hyperlipidemia associated with type 2 diabetes mellitus (HCC)   Relevant Medications   Dulaglutide (TRULICITY) 1.5 XQ/8.2KS SOPN   Acquired hypothyroidism    Due to recheck TSH.       Relevant Orders   TSH     Musculoskeletal and Integument   Gout of ankle    Due to recheck uric acid.       Relevant Orders   Uric acid    Other Visit Diagnoses    Uncontrolled type 2 diabetes mellitus with hyperglycemia ([place])    -  Primary   Relevant Medications   Dulaglutide (TRULICITY) 1.5 HN/8.8TJ SOPN   Other Relevant Orders   POCT glycosylated hemoglobin (Hb A1C) (Completed)   COMPLETE METABOLIC PANEL WITH GFR   Lipid panel   Uric acid   CBC   Cough          Cough  -  she has been battling this for couple of weeks and even after prednisone is still having a deep cough.  She does have crackles at the left lung base we will go ahead and treat with azithromycin.  Discussed pros and cons of antibiotic use.  Meds ordered this encounter  Medications  . azithromycin (ZITHROMAX) 250 MG tablet    Sig: 2 Ttabs PO on Day 1, then one a day x 4 days.    Dispense:  6 tablet    Refill:  0  . Dulaglutide (TRULICITY) 1.5 LL/9.7IX SOPN    Sig: Inject 1.5 mg into the skin once a week.    Dispense:  2 mL    Refill:  5    Follow-up: Return in about 3 months (around 01/03/2021) for Diabetes follow-up.    Beatrice Lecher, MD

## 2020-10-03 NOTE — Assessment & Plan Note (Signed)
Hemoglobin A1c jumped up to 7.8 she is actually gotten down to 6.9 but admits she has not been doing a great job with her diet but plans on getting back on track.  We will also increase her Trulicity from 0.75 to 1.5 mg.  Follow-up in 3 months.

## 2020-10-03 NOTE — Assessment & Plan Note (Signed)
Due to recheck TSH. 

## 2020-11-16 ENCOUNTER — Other Ambulatory Visit: Payer: Self-pay | Admitting: Pulmonary Disease

## 2020-11-16 DIAGNOSIS — J4540 Moderate persistent asthma, uncomplicated: Secondary | ICD-10-CM

## 2020-11-16 NOTE — Progress Notes (Signed)
pft  

## 2020-11-16 NOTE — Progress Notes (Signed)
'@Patient'  ID: Tara Campbell, female    DOB: 1946-07-24, 74 y.o.   MRN: [id number]  Chief Complaint  Patient presents with  . Follow-up    PFT today-sob same,nasal congestion x 2-3 days, no cough    Referring provider: Hali Campbell, *  HPI: 74 year old female, never smoked.  Last medical history significant for severe persistent asthma with allergic phenotype, allergic rhinitis, chronic sinusitis, nasal polyps.  Former patient of Dr. Lake Campbell, last seen by pulmonary nurse practitioner in October 2021.  Maintained on Breo 200, Spiriva Respimat 2.5, Xyzal, Flonase and Singulair.  Previous [facility] pulmonary encounter: 09/28/2020 Follow up : Asthma and allergic rhinitis Patient presents for an emergency room follow-up.  Patient recently had an asthma exacerbation was seen in the emergency room September 24, 2020.  Care everywhere notes show chest x-ray was clear.  Patient did have chest tightness.  Cardiac enzymes were negative.  Lab work showed normal CBC except for elevated eosinophils at absolute count of 800, essentially normal CHEM panel.  Blood sugar was elevated at 206.  She was treated with IV steroids and nebulized bronchodilator treatment.  She was discharged on a prednisone taper with 60 mg daily for 5 days.  Since ER visit patient is feeling better .  Cough and wheezing are decreasing. Was starting to slowly taper down on Spiriva . Wonders if this contributed to her flare.  Also exposed to grandson with cold like symptoms . Covid test was neg.  Says she has been doing exceptionally well until this past week. Really feels that her allergy regimen with allergy drops has really been helping her asthma she not been on steroids for greater than a year. She denies any hemoptysis chest pain orthopnea PND or leg swelling.  11/17/2020-Interim history Patient presents today for 6 week follow-up with Spirometry. Spirometry today appears relatively stable comparted to 2019. She is doing well,  breathing is stable. She reports nasal congestion x 4 days. She complaint with Breo, Spiriva, Xyzal, Singulair and Flonase. Gets winded with exertion. She does not need to use her Albuterol rescue inhaler often. Denies shortness of breath, wheezing or chest tighteness   Imaging:  CT chest Apr 22, 2019 showed no evidence of mediastinal adenopathy, hepatic steatosis and coronary artery disease, no acute process in the lungs  CT sinuses December 09, 2018 showed extensive chronic pansinusitis.  2D echo August 22, 2020 EF 09-32%, grade 1 diastolic dysfunction   Pulmonary testing: Spirometry December 2019 - FEV1 75%, ratio 74, FVC 76%  PFTs 01/30/18 - FVC 1.86 (63%), FEV1 1.24 (57%), ratio 69  Spirometry 11/17/2020 - FVC 1.74 (62%), FEV1 1.  1 7 (56%), ratio 67  Allergies  Allergen Reactions  . Latex   . Sulfa Antibiotics Rash    Immunization History  Administered Date(s) Administered  . Fluad Quad(high Dose 65+) 09/22/2019  . Influenza, High Dose Seasonal PF 08/29/2017, 09/07/2018, 08/13/2020  . PFIZER SARS-COV-2 Vaccination 12/13/2019, 01/04/2020, 09/03/2020  . Pneumococcal Conjugate-13 11/16/2019  . Pneumococcal Polysaccharide-23 08/29/2017  . Tdap 05/29/2017  . Zoster Recombinat (Shingrix) 02/28/2018, 06/12/2018    Past Medical History:  Diagnosis Date  . Allergy   . Arthritis   . Asthma   . Colon polyps   . Diabetes mellitus without complication ([place])   . Gout   . Heart attack ([place]) 07/30/2016   PCI, 2 stents  . Hyperlipidemia   . Hypertension   . Internal hemorrhoids   . Left rotator cuff tear   . Mild stage  glaucoma 12/12/2017  . Otomycosis of right ear 09/27/2017  . Paroxysmal atrial fibrillation ([facility])   . Thyroid disease     Tobacco History: Social History   Tobacco Use  Smoking Status Never Smoker  Smokeless Tobacco Never Used   Counseling given: Not Answered   Outpatient Medications Prior to Visit  Medication Sig Dispense Refill  . albuterol  (PROVENTIL) (2.5 MG/3ML) 0.083% nebulizer solution Take 3 mLs (2.5 mg total) by nebulization every 6 (six) hours as needed for wheezing or shortness of breath. 75 mL 12  . albuterol (VENTOLIN HFA) 108 (90 Base) MCG/ACT inhaler Inhale 2 puffs into the lungs every 6 (six) hours as needed for wheezing. 18 g prn  . allopurinol (ZYLOPRIM) 300 MG tablet Take 1 tablet (300 mg total) by mouth 2 (two) times daily. 180 tablet 1  . apixaban (ELIQUIS) 5 MG TABS tablet Take 1 tablet (5 mg total) by mouth 2 (two) times daily. 180 tablet 1  . atenolol (TENORMIN) 100 MG tablet Take 1 tablet (100 mg total) by mouth daily. 90 tablet 1  . atorvastatin (LIPITOR) 80 MG tablet Take 1 tablet (80 mg total) by mouth at bedtime. 90 tablet 3  . blood glucose meter kit and supplies KIT Check morning fasting blood glucose daily and up to 4 times daily as directed 1 each 0  . COLCRYS 0.6 MG tablet TAKE 1 TABLET BY MOUTH  DAILY AS NEEDED 30 tablet 11  . Cyanocobalamin (B-12 PO) Take 1,000 mg by mouth daily.    . Dulaglutide (TRULICITY) 1.5 BD/5.3GD SOPN Inject 1.5 mg into the skin once a week. 2 mL 5  . fluticasone (FLONASE) 50 MCG/ACT nasal spray Place 1 spray into both nostrils daily. 16 g 3  . fluticasone furoate-vilanterol (BREO ELLIPTA) 200-25 MCG/INH AEPB Inhale 1 puff into the lungs daily. Needs appt for further refills 60 each 6  . glucose blood test strip To be used twice daily for testing blood sugars. E11.65 200 each 11  . ipratropium-albuterol (DUONEB) 0.5-2.5 (3) MG/3ML SOLN Take 3 mLs by nebulization every 6 (six) hours as needed. 3 mL PRN  . irbesartan-hydrochlorothiazide (AVALIDE) 300-12.5 MG tablet Take 1 tablet by mouth daily. 90 tablet 1  . levalbuterol (XOPENEX HFA) 45 MCG/ACT inhaler Inhale 1-2 puffs into the lungs every 6 (six) hours as needed for wheezing or shortness of breath.    . levocetirizine (XYZAL ALLERGY 24HR) 5 MG tablet Take 1 tablet (5 mg total) by mouth every evening. 90 tablet 3  .  levothyroxine (SYNTHROID) 112 MCG tablet Take 1 tablet (112 mcg total) by mouth daily before breakfast. 90 tablet 1  . metFORMIN (GLUCOPHAGE) 1000 MG tablet Take 1 tablet (1,000 mg total) by mouth 2 (two) times daily with a meal. 180 tablet 3  . montelukast (SINGULAIR) 10 MG tablet Take 1 tablet (10 mg total) by mouth at bedtime. 90 tablet 3  . nitroGLYCERIN (NITROSTAT) 0.4 MG SL tablet Place 1 tablet (0.4 mg total) under the tongue every 5 (five) minutes as needed for chest pain (or tightness). 30 tablet 0  . Omega-3 Fatty Acids (FISH OIL PO) Take 5,000 Units by mouth daily.    . Sod Picosulfate-Mag Ox-Cit Acd (CLENPIQ) 10-3.5-12 MG-GM -GM/160ML SOLN Take 1 kit by mouth as directed. 320 mL 0  . SPIRIVA RESPIMAT 2.5 MCG/ACT AERS Inhale 2 puffs into the lungs daily. 4 g 5   No facility-administered medications prior to visit.      Review of Systems  Review of  Systems  Constitutional: Negative.   HENT: Positive for congestion, postnasal drip and sinus pressure.   Respiratory: Negative for cough, chest tightness, shortness of breath and wheezing.      Physical Exam  BP 120/60 (BP Location: Right Arm, Cuff Size: Large)   Pulse (!) 102   Temp 98 F (36.7 C) (Temporal)   Ht '5\' 4"'  (1.626 m)   Wt 192 lb (87.1 kg)   SpO2 99%   BMI 32.96 kg/m  Physical Exam Constitutional:      Appearance: Normal appearance.  HENT:     Mouth/Throat:     Mouth: Mucous membranes are moist.     Pharynx: Oropharynx is clear.  Cardiovascular:     Rate and Rhythm: Normal rate and regular rhythm.  Pulmonary:     Effort: Pulmonary effort is normal.     Breath sounds: Normal breath sounds.  Musculoskeletal:        General: Normal range of motion.  Skin:    General: Skin is warm and dry.  Neurological:     General: No focal deficit present.     Mental Status: She is alert and oriented to person, place, and time. Mental status is at baseline.  Psychiatric:        Mood and Affect: Mood normal.         Behavior: Behavior normal.        Thought Content: Thought content normal.        Judgment: Judgment normal.      Lab Results:  CBC    Component Value Date/Time   WBC 10.7 06/08/2020 1602   RBC 4.61 06/08/2020 1602   HGB 13.8 06/08/2020 1602   HGB 12.8 12/26/2017 1630   HCT 40.2 06/08/2020 1602   HCT 37.3 12/26/2017 1630   PLT 401 (H) 06/08/2020 1602   MCV 87.2 06/08/2020 1602   MCV 86 12/26/2017 1630   MCH 29.9 06/08/2020 1602   MCHC 34.3 06/08/2020 1602   RDW 12.6 06/08/2020 1602   RDW 13.5 12/26/2017 1630   LYMPHSABS 1,964 08/05/2018 1022   LYMPHSABS 1.8 12/26/2017 1630   MONOABS 1.0 03/07/2018 2159   EOSABS 809 (H) 08/05/2018 1022   EOSABS 1.0 (H) 12/26/2017 1630   BASOSABS 62 08/05/2018 1022   BASOSABS 0.1 12/26/2017 1630    BMET    Component Value Date/Time   NA 141 02/22/2020 1517   K 4.1 02/22/2020 1517   CL 104 02/22/2020 1517   CO2 24 02/22/2020 1517   GLUCOSE 131 02/22/2020 1517   BUN 16 02/22/2020 1517   CREATININE 0.80 02/22/2020 1517   CALCIUM 10.5 (H) 02/22/2020 1517   GFRNONAA 72 02/22/2020 1517   GFRAA 84 02/22/2020 1517    BNP No results found for: BNP  ProBNP No results found for: PROBNP  Imaging: No results found.   Assessment & Plan:   Sinusitis - Continue Xyzal, Singulair and Flonase  - Restart saline nasal rinses twice daily  - If no improvement patient can take prescription for Doxycycline 1 tab twice daily x 7 days   Asthma - Severe persistent asthma, allergic phenotype - PFTs today showed stable lung function. She has moderate obstruction. - Checking Alpha-1 antitrypsin level - No changes to current therapy; continue Breo 200 and Spiriva Respimat 2.58mg - FU in 6 months with new [facility] pulmonary provider (former [facility] patient)   EMartyn Ehrich NP 11/18/2020

## 2020-11-17 ENCOUNTER — Ambulatory Visit: Payer: PPO | Admitting: Adult Health

## 2020-11-17 ENCOUNTER — Ambulatory Visit (INDEPENDENT_AMBULATORY_CARE_PROVIDER_SITE_OTHER): Payer: PPO | Admitting: Pulmonary Disease

## 2020-11-17 ENCOUNTER — Ambulatory Visit: Payer: PPO | Admitting: Primary Care

## 2020-11-17 ENCOUNTER — Encounter: Payer: Self-pay | Admitting: Primary Care

## 2020-11-17 ENCOUNTER — Other Ambulatory Visit: Payer: Self-pay

## 2020-11-17 VITALS — BP 120/60 | HR 102 | Temp 98.0°F | Ht 64.0 in | Wt 192.0 lb

## 2020-11-17 DIAGNOSIS — J019 Acute sinusitis, unspecified: Secondary | ICD-10-CM | POA: Diagnosis not present

## 2020-11-17 DIAGNOSIS — J4540 Moderate persistent asthma, uncomplicated: Secondary | ICD-10-CM

## 2020-11-17 DIAGNOSIS — J329 Chronic sinusitis, unspecified: Secondary | ICD-10-CM | POA: Insufficient documentation

## 2020-11-17 LAB — PULMONARY FUNCTION TEST
FEF 25-75 Post: 0.61 L/sec
FEF 25-75 Pre: 0.66 L/sec
FEF2575-%Change-Post: -6 %
FEF2575-%Pred-Post: 38 %
FEF2575-%Pred-Pre: 40 %
FEV1-%Change-Post: 0 %
FEV1-%Pred-Post: 56 %
FEV1-%Pred-Pre: 56 %
FEV1-Post: 1.17 L
FEV1-Pre: 1.17 L
FEV1FVC-%Change-Post: 0 %
FEV1FVC-%Pred-Pre: 89 %
FEV6-%Change-Post: 0 %
FEV6-%Pred-Post: 66 %
FEV6-%Pred-Pre: 65 %
FEV6-Post: 1.74 L
FEV6-Pre: 1.73 L
FEV6FVC-%Change-Post: 0 %
FEV6FVC-%Pred-Post: 105 %
FEV6FVC-%Pred-Pre: 104 %
FVC-%Change-Post: 0 %
FVC-%Pred-Post: 62 %
FVC-%Pred-Pre: 63 %
FVC-Post: 1.74 L
FVC-Pre: 1.75 L
Post FEV1/FVC ratio: 67 %
Post FEV6/FVC ratio: 100 %
Pre FEV1/FVC ratio: 67 %
Pre FEV6/FVC Ratio: 100 %

## 2020-11-17 MED ORDER — DOXYCYCLINE HYCLATE 100 MG PO TABS: 100.0000 mg | ORAL_TABLET | Freq: Two times a day (BID) | ORAL | 0 refills | Status: DC

## 2020-11-17 NOTE — Assessment & Plan Note (Addendum)
-   Continue Xyzal, Singulair and Flonase  - Restart saline nasal rinses twice daily  - If no improvement patient can take prescription for Doxycycline 1 tab twice daily x 7 days

## 2020-11-17 NOTE — Progress Notes (Signed)
Spirometry pre and post done today. 

## 2020-11-17 NOTE — Patient Instructions (Addendum)
  Spirometry today showed moderate restriction/obstruction consistent with fixed asthma. No significant change compared to 2019  Recommendations: - Continue Breo Ellipta one puff daily - Continue Spiriva Respimat 2 puffs daily  - Continue Xyzal, Singulair and Flonase  - Restart saline nasal rinses - If no improvement go ahead and take Doxycycline antibiotic   Orders: - Incentive spirometer  - Labs (alpha 1 level)  Follow-up: - 6 months with either Dr. Francine Graven or Mannam (new patient asthma- former [facility])

## 2020-11-18 ENCOUNTER — Other Ambulatory Visit: Payer: Self-pay | Admitting: Adult Health

## 2020-11-18 NOTE — Assessment & Plan Note (Signed)
-   Severe persistent asthma, allergic phenotype - PFTs today showed stable lung function. She has moderate obstruction. - Checking Alpha-1 antitrypsin level - No changes to current therapy; continue Breo 200 and Spiriva Respimat 2.45mcg - FU in 6 months with new [facility] pulmonary provider (former [facility] patient)

## 2020-12-02 LAB — ALPHA-1 ANTITRYPSIN PHENOTYPE: A-1 Antitrypsin, Ser: 160 mg/dL (ref 83–199)

## 2020-12-05 ENCOUNTER — Encounter: Payer: Self-pay | Admitting: *Deleted

## 2020-12-05 NOTE — Progress Notes (Signed)
Alpha 1 lab test showed normal phenotype and level

## 2021-01-03 ENCOUNTER — Ambulatory Visit: Payer: PPO | Admitting: Family Medicine

## 2021-01-23 ENCOUNTER — Telehealth: Payer: Self-pay | Admitting: Adult Health

## 2021-01-23 NOTE — Telephone Encounter (Signed)
Tried calling the pt to get more details on symptoms  LMTCB and also sent her email to please call to discuss further

## 2021-01-23 NOTE — Telephone Encounter (Signed)
Tara Campbell, Last time i was in the office was after a trip to the ER. At that time you mentioned that if I suspected I was having breathing issues; well, here I am. Over the weekend I experienced shortness of breath, wheezing and coughing which is bringing up a very thick mucus which is a light yellow green color.  I started back with using my nebulizer and inhaler.  Should I come in to the office or can you prescribe something without seeing me?  Please let me know.  Thanks [employer] [phone], Email [email or web address]   Called and spoke with pt and she denies any body aches, chills, fever.  She stated that the sputum is very thick and feels like her asthma is flaring and she always has to have abx for this.     Appt scheduled for pt to see TP tomorrow at 230.  Nothing further is needed.

## 2021-01-24 ENCOUNTER — Encounter: Payer: Self-pay | Admitting: Adult Health

## 2021-01-24 ENCOUNTER — Other Ambulatory Visit: Payer: Self-pay

## 2021-01-24 ENCOUNTER — Telehealth: Payer: Self-pay | Admitting: *Deleted

## 2021-01-24 ENCOUNTER — Ambulatory Visit: Payer: PPO | Admitting: Adult Health

## 2021-01-24 DIAGNOSIS — J4551 Severe persistent asthma with (acute) exacerbation: Secondary | ICD-10-CM

## 2021-01-24 DIAGNOSIS — J3089 Other allergic rhinitis: Secondary | ICD-10-CM

## 2021-01-24 MED ORDER — PREDNISONE 20 MG PO TABS: 20.0000 mg | ORAL_TABLET | Freq: Every day | ORAL | 0 refills | Status: DC

## 2021-01-24 MED ORDER — AZITHROMYCIN 250 MG PO TABS: ORAL_TABLET | ORAL | 0 refills | Status: AC

## 2021-01-24 NOTE — Addendum Note (Signed)
Addended by: Delrae Rend on: 01/24/2021 05:38 PM   Modules accepted: Orders

## 2021-01-24 NOTE — Assessment & Plan Note (Signed)
Acute exacerbation with associated bronchitis.  Will treat with empiric antibiotics and short course of steroids.   She has severe persistent asthma with allergic phenotype.  Patient is followed by allergist and currently on allergy drops.  This will make to asthma exacerbations in the last 6 months that are requiring prednisone.  Discussed that with her diabetes potential complications of frequent steroid use.  On return visit we will discuss additional maintenance medications with possible addition of a biologic such as Dupixent, [name] etc. as her previous lab work is showed elevated eosinophils. Patient has been on allergy drops for 2 years.   Plan  Patient Instructions  Continue on Breo 200 1 puff daily, rinse after use Continue on Spiriva 2 puffs daily, rinse after use  Duoneb Twice daily  For 3 days then as needed.  Mucinex DM Twice daily  As needed  Cough/congestion  Zpack take as directed. (hold colchicine for 1 week while taking)  Prednisone 20mg  daily for 5 days .  Continue on  Xyzal daily Continue on Flonase 2 puffs daily Continue on Singulair daily.  Continue on with sinus rinses.  Discuss on return adding Biologic such as Dupixent, [name], etc if asthma continues to flare.  Follow-up with Dr.  or Antoine Fiallos NP in 6 weeks with pre/post spirometry  and As needed   Please contact office for sooner follow up if symptoms do not improve or worsen or seek emergency care

## 2021-01-24 NOTE — Patient Instructions (Addendum)
Continue on Breo 200 1 puff daily, rinse after use Continue on Spiriva 2 puffs daily, rinse after use  Duoneb Twice daily  For 3 days then as needed.  Mucinex DM Twice daily  As needed  Cough/congestion  Zpack take as directed. (hold colchicine for 1 week while taking)  Prednisone 20mg  daily for 5 days .  Continue on  Xyzal daily Continue on Flonase 2 puffs daily Continue on Singulair daily.  Continue on with sinus rinses.  Discuss on return adding Biologic such as Dupixent, [name], etc if asthma continues to flare.  Follow-up with Dr.  or Jevaughn Degollado NP in 6 weeks with pre/post spirometry  and As needed   Please contact office for sooner follow up if symptoms do not improve or worsen or seek emergency care

## 2021-01-24 NOTE — Assessment & Plan Note (Signed)
Chronic rhinitis and history of nasal polyposis.  Continue on aggressive sinus regimen with saline nasal spray and Flonase.

## 2021-01-24 NOTE — Progress Notes (Signed)
_0  ID: Tara Campbell, female    DOB: May 27, 1946, 75 y.o.   MRN: [id number]  Chief Complaint  Patient presents with  . Acute Visit    Referring provider: Hali Marry, *  HPI: 75 year old female never smoker seen for pulmonary consult January 2019 for severe persistent asthma with allergic phenotype and allergic rhinitis Medical history significant for nasal polyposis and chronic sinusitis A. fib on anticoagulation therapy, coronary artery disease, diabetes  TEST/EVENTS :  Spirometry December 2019 FEV1 75%, ratio 74, FVC 76%  CT chest Apr 22, 2019 showed no evidence of mediastinal adenopathy, hepatic steatosis and coronary artery disease, no acute process in the lungs  CT sinuses December 09, 2018 showed extensive chronic pansinusitis.  2D echo August 22, 2020 EF 82-99%, grade 1 diastolic dysfunction  September 24, 2020.   Lab work showed normal CBC except for elevated eosinophils at absolute count of 800  Chest x-ray August 16, 2020 clear  01/24/2021 Acute OV : Asthma flare  Patient presents for an acute office visit.  Patient complains of 5 days of productive cough with thick yellow-green mucus, wheezing, shortness of breath.  She denies any fever chills body aches.  She did a Covid test that was negative.  She remains on Breo daily, Spiriva daily.  She has DuoNeb nebulizer, rare use.  She is on Singulair, Xyzal and Flonase daily Fully vaccinated for Covid 19 including booster On allergy drops wit Allergist. Has been taking for last 2 years.  Says doing well until 5 days ago.  Has had  1-2  flare  in last 6 months, requiring prednisone .  Activity level is at baseline.     Allergies  Allergen Reactions  . Latex   . Sulfa Antibiotics Rash    Immunization History  Administered Date(s) Administered  . Fluad Quad(high Dose 65+) 09/22/2019  . Influenza, High Dose Seasonal PF 08/29/2017, 09/07/2018, 08/13/2020  . PFIZER(Purple Top)SARS-COV-2  Vaccination 12/13/2019, 01/04/2020, 09/03/2020  . Pneumococcal Conjugate-13 11/16/2019  . Pneumococcal Polysaccharide-23 08/29/2017  . Tdap 05/29/2017  . Zoster Recombinat (Shingrix) 02/28/2018, 06/12/2018    Past Medical History:  Diagnosis Date  . Allergy   . Arthritis   . Asthma   . Colon polyps   . Diabetes mellitus without complication ([place])   . Gout   . Heart attack ([place]) 07/30/2016   PCI, 2 stents  . Hyperlipidemia   . Hypertension   . Internal hemorrhoids   . Left rotator cuff tear   . 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 Otomycosis of right ear 09/27/2017  . Paroxysmal atrial fibrillation (HCC)   . Thyroid disease     Tobacco History: Social History   Tobacco Use  Smoking Status Never Smoker  Smokeless Tobacco Never Used   Counseling given: Not Answered   Outpatient Medications Prior to Visit  Medication Sig Dispense Refill  . albuterol (PROVENTIL) (2.5 MG/3ML) 0.083% nebulizer solution Take 3 mLs (2.5 mg total) by nebulization every 6 (six) hours as needed for wheezing or shortness of breath. 75 mL 12  . albuterol (VENTOLIN HFA) 108 (90 Base) MCG/ACT inhaler Inhale 2 puffs into the lungs every 6 (six) hours as needed for wheezing. 18 g prn  . allopurinol (ZYLOPRIM) 300 MG tablet Take 1 tablet (300 mg total) by mouth 2 (two) times daily. 180 tablet 1  . apixaban (ELIQUIS) 5 MG TABS tablet Take 1 tablet (5 mg total) by mouth 2 (two) times daily. 180 tablet 1  . atenolol (TENORMIN) 100  MG tablet Take 1 tablet (100 mg total) by mouth daily. 90 tablet 1  . atorvastatin (LIPITOR) 80 MG tablet Take 1 tablet (80 mg total) by mouth at bedtime. 90 tablet 3  . blood glucose meter kit and supplies KIT Check morning fasting blood glucose daily and up to 4 times daily as directed 1 each 0  . COLCRYS 0.6 MG tablet TAKE 1 TABLET BY MOUTH  DAILY AS NEEDED 30 tablet 11  . Cyanocobalamin (B-12 PO) Take 1,000 mg by mouth daily.    . Dulaglutide (TRULICITY) 1.5 GB/2.0FE SOPN Inject  1.5 mg into the skin once a week. 2 mL 5  . fluticasone (FLONASE) 50 MCG/ACT nasal spray Place 1 spray into both nostrils daily. 16 g 3  . fluticasone furoate-vilanterol (BREO ELLIPTA) 200-25 MCG/INH AEPB Inhale 1 puff into the lungs daily. Needs appt for further refills 60 each 6  . glucose blood test strip To be used twice daily for testing blood sugars. E11.65 200 each 11  . ipratropium-albuterol (DUONEB) 0.5-2.5 (3) MG/3ML SOLN Take 3 mLs by nebulization every 6 (six) hours as needed. 3 mL PRN  . irbesartan-hydrochlorothiazide (AVALIDE) 300-12.5 MG tablet Take 1 tablet by mouth daily. 90 tablet 1  . levalbuterol (XOPENEX HFA) 45 MCG/ACT inhaler Inhale 1-2 puffs into the lungs every 6 (six) hours as needed for wheezing or shortness of breath.    . levocetirizine (XYZAL ALLERGY 24HR) 5 MG tablet Take 1 tablet (5 mg total) by mouth every evening. 90 tablet 3  . levothyroxine (SYNTHROID) 112 MCG tablet Take 1 tablet (112 mcg total) by mouth daily before breakfast. 90 tablet 1  . metFORMIN (GLUCOPHAGE) 1000 MG tablet Take 1 tablet (1,000 mg total) by mouth 2 (two) times daily with a meal. 180 tablet 3  . montelukast (SINGULAIR) 10 MG tablet Take 1 tablet (10 mg total) by mouth at bedtime. 90 tablet 3  . nitroGLYCERIN (NITROSTAT) 0.4 MG SL tablet Place 1 tablet (0.4 mg total) under the tongue every 5 (five) minutes as needed for chest pain (or tightness). 30 tablet 0  . Omega-3 Fatty Acids (FISH OIL PO) Take 5,000 Units by mouth daily.    . Sod Picosulfate-Mag Ox-Cit Acd (CLENPIQ) 10-3.5-12 MG-GM -GM/160ML SOLN Take 1 kit by mouth as directed. 320 mL 0  . SPIRIVA RESPIMAT 2.5 MCG/ACT AERS Inhale 2 puffs into the lungs daily. 4 g 5  . doxycycline (VIBRA-TABS) 100 MG tablet Take 1 tablet (100 mg total) by mouth 2 (two) times daily. (Patient not taking: Reported on 01/24/2021) 14 tablet 0   No facility-administered medications prior to visit.     Review of Systems:   Constitutional:   No  weight  loss, night sweats,  Fevers, chills, fatigue, or  lassitude.  HEENT:   No headaches,  Difficulty swallowing,  Tooth/dental problems, or  Sore throat,                No sneezing, itching, ear ache,  +nasal congestion, post nasal drip,   CV:  No chest pain,  Orthopnea, PND, swelling in lower extremities, anasarca, dizziness, palpitations, syncope.   GI  No heartburn, indigestion, abdominal pain, nausea, vomiting, diarrhea, change in bowel habits, loss of appetite, bloody stools.   Resp:    No chest wall deformity  Skin: no rash or lesions.  GU: no dysuria, change in color of urine, no urgency or frequency.  No flank pain, no hematuria   MS:  No joint pain or swelling.  No  decreased range of motion.  No back pain.    Physical Exam  BP 140/70 (BP Location: Left Arm, Patient Position: Sitting, Cuff Size: Large)   Pulse 98   Temp 97.9 F (36.6 C) (Temporal)   Ht _0  (1.626 m)   Wt 190 lb 12.8 oz (86.5 kg)   SpO2 94%   BMI 32.75 kg/m   GEN: A/Ox3; pleasant , NAD, well nourished    HEENT:  [place]/AT,  NOSE-clear, THROAT-clear, no lesions, no postnasal drip or exudate noted.   NECK:  Supple w/ fair ROM; no JVD; normal carotid impulses w/o bruits; no thyromegaly or nodules palpated; no lymphadenopathy.    RESP few trace wheezes.  Speaks in full sentences.  no accessory muscle use, no dullness to percussion  CARD:  RRR, no m/r/g, no peripheral edema, pulses intact, no cyanosis or clubbing.  GI:   Soft & nt; nml bowel sounds; no organomegaly or masses detected.   Musco: Warm bil, no deformities or joint swelling noted.   Neuro: alert, no focal deficits noted.    Skin: Warm, no lesions or rashes    Lab Results:  CBC    BNP No results found for: BNP  ProBNP No results found for: PROBNP  Imaging: No results found.    PFT Results Latest Ref Rng & Units 11/17/2020 01/30/2018  FVC-Pre L 1.75 1.74  FVC-Predicted Pre % 63 60  FVC-Post L 1.74 1.80  FVC-Predicted Post  % 62 63  Pre FEV1/FVC % % 67 67  Post FEV1/FCV % % 67 69  FEV1-Pre L 1.17 1.17  FEV1-Predicted Pre % 56 54  FEV1-Post L 1.17 1.24  DLCO uncorrected ml/min/mmHg - 20.06  DLCO UNC% % - 82  DLCO corrected ml/min/mmHg - 20.45  DLCO COR %Predicted % - 84  DLVA Predicted % - 118  TLC L - 4.04  TLC % Predicted % - 80  RV % Predicted % - 87    Lab Results  Component Value Date   NITRICOXIDE 49 11/14/2018        Assessment & Plan:   Asthma Acute exacerbation with associated bronchitis.  Will treat with empiric antibiotics and short course of steroids.   She has severe persistent asthma with allergic phenotype.  Patient is followed by allergist and currently on allergy drops.  This will make to asthma exacerbations in the last 6 months that are requiring prednisone.  Discussed that with her diabetes potential complications of frequent steroid use.  On return visit we will discuss additional maintenance medications with possible addition of a biologic such as Dupixent, [name] etc. as her previous lab work is showed elevated eosinophils. Patient has been on allergy drops for 2 years.   Plan  Patient Instructions  Continue on Breo 200 1 puff daily, rinse after use Continue on Spiriva 2 puffs daily, rinse after use  Duoneb Twice daily  For 3 days then as needed.  Mucinex DM Twice daily  As needed  Cough/congestion  Zpack take as directed. (hold colchicine for 1 week while taking)  Prednisone 10m daily for 5 days .  Continue on  Xyzal daily Continue on Flonase 2 puffs daily Continue on Singulair daily.  Continue on with sinus rinses.  Discuss on return adding Biologic such as Dupixent, [name], etc if asthma continues to flare.  Follow-up with Dr. MVaughan Browner or Lashika Erker NP in 6 weeks with pre/post spirometry  and As needed   Please contact office for sooner follow up if symptoms do  not improve or worsen or seek emergency care       Non-seasonal allergic rhinitis Chronic rhinitis and  history of nasal polyposis.  Continue on aggressive sinus regimen with saline nasal spray and Flonase.     Rexene Edison, NP 01/24/2021

## 2021-01-24 NOTE — Telephone Encounter (Signed)
ATC x1 to cell and home #, left vm at both numbers.  Advised that I would attempt to reach her tomorrow to schedule her a 30 minute breathing test (spirometry pre/post) prior to her f/u with Dr. Isaiah Serge on 02/21/21 at 9 am.

## 2021-02-21 ENCOUNTER — Ambulatory Visit: Payer: PPO | Admitting: Pulmonary Disease

## 2021-02-21 ENCOUNTER — Encounter: Payer: Self-pay | Admitting: Pulmonary Disease

## 2021-02-21 ENCOUNTER — Other Ambulatory Visit: Payer: Self-pay

## 2021-02-21 VITALS — BP 126/78 | HR 91 | Temp 97.4°F | Ht 64.0 in | Wt 193.0 lb

## 2021-02-21 DIAGNOSIS — J4551 Severe persistent asthma with (acute) exacerbation: Secondary | ICD-10-CM

## 2021-02-21 LAB — CBC WITH DIFFERENTIAL/PLATELET
Basophils Absolute: 0.1 10*3/uL (ref 0.0–0.1)
Basophils Relative: 0.8 % (ref 0.0–3.0)
Eosinophils Absolute: 1 10*3/uL — ABNORMAL HIGH (ref 0.0–0.7)
Eosinophils Relative: 12.4 % — ABNORMAL HIGH (ref 0.0–5.0)
HCT: 38 % (ref 36.0–46.0)
Hemoglobin: 13.3 g/dL (ref 12.0–15.0)
Lymphocytes Relative: 19.9 % (ref 12.0–46.0)
Lymphs Abs: 1.7 10*3/uL (ref 0.7–4.0)
MCHC: 34.9 g/dL (ref 30.0–36.0)
MCV: 84.1 fl (ref 78.0–100.0)
Monocytes Absolute: 0.8 10*3/uL (ref 0.1–1.0)
Monocytes Relative: 9.2 % (ref 3.0–12.0)
Neutro Abs: 4.8 10*3/uL (ref 1.4–7.7)
Neutrophils Relative %: 57.7 % (ref 43.0–77.0)
Platelets: 304 10*3/uL (ref 150.0–400.0)
RBC: 4.52 Mil/uL (ref 3.87–5.11)
RDW: 13.1 % (ref 11.5–15.5)
WBC: 8.4 10*3/uL (ref 4.0–10.5)

## 2021-02-21 NOTE — Progress Notes (Signed)
Tara Campbell    [id number]    [birth year]  Primary Care Physician:Metheney, Rene Kocher, MD  Referring Physician: Hali Marry, MD [address] [place],  [place] [address]  Chief complaint:  Consult for asthma  HPI: 75 year old with severe persistent asthma, nasal polyposis, allergies She is on maximal therapy with Breo, Spiriva, Singulair and is getting allergy shots with the allergist at [facility] to have significant symptoms with recurrent exacerbations.  She is needed to go on prednisone 4 times the past year. Has chronic dyspnea on exertion, wheeze  Pets: No pets Occupation: Used to work in Office manager Exposures: No mold, hot tub, Customer service manager.  No feather pillows or comforters Smoking history: Never smoker Travel history: Originally from [place].  Moved to [place] in 2017 Relevant family history: No significant family history of lung disease   Outpatient Encounter Medications as of 02/21/2021  Medication Sig  . albuterol (PROVENTIL) (2.5 MG/3ML) 0.083% nebulizer solution Take 3 mLs (2.5 mg total) by nebulization every 6 (six) hours as needed for wheezing or shortness of breath.  Marland Kitchen albuterol (VENTOLIN HFA) 108 (90 Base) MCG/ACT inhaler Inhale 2 puffs into the lungs every 6 (six) hours as needed for wheezing.  Marland Kitchen allopurinol (ZYLOPRIM) 300 MG tablet Take 1 tablet (300 mg total) by mouth 2 (two) times daily.  Marland Kitchen apixaban (ELIQUIS) 5 MG TABS tablet Take 1 tablet (5 mg total) by mouth 2 (two) times daily.  Marland Kitchen atenolol (TENORMIN) 100 MG tablet Take 1 tablet (100 mg total) by mouth daily.  Marland Kitchen atorvastatin (LIPITOR) 80 MG tablet Take 1 tablet (80 mg total) by mouth at bedtime.  . blood glucose meter kit and supplies KIT Check morning fasting blood glucose daily and up to 4 times daily as directed  . COLCRYS 0.6 MG tablet TAKE 1 TABLET BY MOUTH  DAILY AS NEEDED  . Cyanocobalamin (B-12 PO) Take 1,000 mg by mouth daily.  . Dulaglutide  (TRULICITY) 1.5 UK/0.2RK SOPN Inject 1.5 mg into the skin once a week.  . fluticasone (FLONASE) 50 MCG/ACT nasal spray Place 1 spray into both nostrils daily.  . fluticasone furoate-vilanterol (BREO ELLIPTA) 200-25 MCG/INH AEPB Inhale 1 puff into the lungs daily. Needs appt for further refills  . glucose blood test strip To be used twice daily for testing blood sugars. E11.65  . ipratropium-albuterol (DUONEB) 0.5-2.5 (3) MG/3ML SOLN Take 3 mLs by nebulization every 6 (six) hours as needed.  . irbesartan-hydrochlorothiazide (AVALIDE) 300-12.5 MG tablet Take 1 tablet by mouth daily.  Marland Kitchen levalbuterol (XOPENEX HFA) 45 MCG/ACT inhaler Inhale 1-2 puffs into the lungs every 6 (six) hours as needed for wheezing or shortness of breath.  . levocetirizine (XYZAL ALLERGY 24HR) 5 MG tablet Take 1 tablet (5 mg total) by mouth every evening.  Marland Kitchen levothyroxine (SYNTHROID) 112 MCG tablet Take 1 tablet (112 mcg total) by mouth daily before breakfast.  . metFORMIN (GLUCOPHAGE) 1000 MG tablet Take 1 tablet (1,000 mg total) by mouth 2 (two) times daily with a meal.  . montelukast (SINGULAIR) 10 MG tablet Take 1 tablet (10 mg total) by mouth at bedtime.  . nitroGLYCERIN (NITROSTAT) 0.4 MG SL tablet Place 1 tablet (0.4 mg total) under the tongue every 5 (five) minutes as needed for chest pain (or tightness).  . Omega-3 Fatty Acids (FISH OIL PO) Take 5,000 Units by mouth daily.  Marland Kitchen SPIRIVA RESPIMAT 2.5 MCG/ACT AERS Inhale 2 puffs into the lungs daily.  . [  DISCONTINUED] predniSONE (DELTASONE) 20 MG tablet Take 1 tablet (20 mg total) by mouth daily with breakfast. (Patient not taking: Reported on 02/21/2021)  . [DISCONTINUED] Sod Picosulfate-Mag Ox-Cit Acd (CLENPIQ) 10-3.5-12 MG-GM -GM/160ML SOLN Take 1 kit by mouth as directed. (Patient not taking: Reported on 02/21/2021)   No facility-administered encounter medications on file as of 02/21/2021.    Allergies as of 02/21/2021 - Review Complete 02/21/2021  Allergen Reaction  Noted  . Latex  10/10/2016  . Sulfa antibiotics Rash 10/10/2016    Past Medical History:  Diagnosis Date  . Allergy   . Arthritis   . Asthma   . Colon polyps   . Diabetes mellitus without complication ([place])   . Gout   . Heart attack ([place]) 07/30/2016   PCI, 2 stents  . Hyperlipidemia   . Hypertension   . Internal hemorrhoids   . Left rotator cuff tear   . 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 Otomycosis of right ear 09/27/2017  . Paroxysmal atrial fibrillation ([facility])   . Thyroid disease     Past Surgical History:  Procedure Laterality Date  . ANGIOPLASTY    . COLONOSCOPY W/ POLYPECTOMY  01/04/2015  . HEMORRHOID BANDING      Family History  Problem Relation Age of Onset  . Hypertension Mother   . Hyperlipidemia Mother   . Glaucoma Father   . Heart disease Father   . Hypertension Father   . Diabetes Father   . Hyperlipidemia Father   . Skin cancer Father   . Diabetes Paternal Grandmother   . Colon cancer Neg Hx   . Esophageal cancer Neg Hx     Social History   Socioeconomic History  . Marital status: Divorced    Spouse name: Not on file  . Number of children: 2  . Years of education: Not on file  . Highest education level: Not on file  Occupational History  . Not on file  Tobacco Use  . Smoking status: Never Smoker  . Smokeless tobacco: Never Used  Vaping Use  . Vaping Use: Never used  Substance and Sexual Activity  . Alcohol use: Yes    Comment: Rare  . Drug use: No  . Sexual activity: Not Currently  Other Topics Concern  . Not on file  Social History Narrative  . Not on file   Social Determinants of Health   Financial Resource Strain: Not on file  Food Insecurity: Not on file  Transportation Needs: Not on file  Physical Activity: Not on file  Stress: Not on file  Social Connections: Not on file  Intimate Partner Violence: Not on file    Review of systems: Review of Systems  Constitutional: Negative for fever and chills.  HENT: Negative.    Eyes: Negative for blurred vision.  Respiratory: as per HPI  Cardiovascular: Negative for chest pain and palpitations.  Gastrointestinal: Negative for vomiting, diarrhea, blood per rectum. Genitourinary: Negative for dysuria, urgency, frequency and hematuria.  Musculoskeletal: Negative for myalgias, back pain and joint pain.  Skin: Negative for itching and rash.  Neurological: Negative for dizziness, tremors, focal weakness, seizures and loss of consciousness.  Endo/Heme/Allergies: Negative for environmental allergies.  Psychiatric/Behavioral: Negative for depression, suicidal ideas and hallucinations.  All other systems reviewed and are negative.  Physical Exam: Blood pressure 126/78, pulse 91, temperature (!) 97.4 F (36.3 C), temperature source Temporal, height _0  (1.626 m), weight 193 lb (87.5 kg), SpO2 97 %. Gen:      No acute distress  HEENT:  EOMI, sclera anicteric Neck:     No masses; no thyromegaly Lungs:    Clear to auscultation bilaterally; normal respiratory effort CV:         Regular rate and rhythm; no murmurs Abd:      + bowel sounds; soft, non-tender; no palpable masses, no distension Ext:    No edema; adequate peripheral perfusion Skin:      Warm and dry; no rash Neuro: alert and oriented x 3 Psych: normal mood and affect  Data Reviewed: Imaging: Chest x-ray 08/16/2020-no acute cardiopulmonary disease.  I have reviewed the images personally.  PFTs: 11/17/2020 FVC 1.74 [62%], FEV1 1.17 (56%], F/F 67 No significant obstruction, restriction suggested by reduced lung volumes.  No bronchodilator response  ACT score 02/21/2021-18  Labs: CBC 08/05/2018-WBC 7.7, eos 10.5%, absolute eosinophil count 809 IgE 12/26/2017-712  Alpha-1 antitrypsin 11/17/2020-160  Assessment:  Severe persistent eosinophilic asthma Allergic rhinitis with nasal polyposis  She has poorly controlled symptoms and would benefit from initiation of trelegy therapy Start paperwork for  nucala Recheck CBC differential and IgE  Continue Breo, Spiriva, Singulair  Plan/Recommendations: [name] Continue inhalers, Singulair CBC, IgE  Marshell Garfinkel MD  Pulmonary and Critical Care 02/21/2021, 8:56 AM  CC: Hali Marry, *

## 2021-02-21 NOTE — Patient Instructions (Addendum)
We will check a CBC with differential and IgE today Start paperwork for [name] Continue the inhalers  Follow-up in 2 to 3 months

## 2021-02-21 NOTE — Addendum Note (Signed)
Addended by: Demetrio Lapping E on: 02/21/2021 09:22 AM   Modules accepted: Orders

## 2021-02-22 LAB — IGE: IgE (Immunoglobulin E), Serum: 1191 kU/L — ABNORMAL HIGH (ref <=114)

## 2021-02-23 ENCOUNTER — Telehealth: Payer: Self-pay | Admitting: Pharmacy Technician

## 2021-02-23 NOTE — Telephone Encounter (Signed)
Submitted a Prior Authorization request to [employer] for [name]  PEN via Cover My Meds. Will update once we receive a response.   Key: [id number] - PA Case ID: [id number]  Faxed patient application to [facility] to [name]. Will follow up.  Phone#  [phone]

## 2021-02-23 NOTE — Telephone Encounter (Signed)
Received New start paperwork for [name]. Will update as we work through the benefits process. 

## 2021-02-27 NOTE — Telephone Encounter (Signed)
Received notification from [facility] regarding a prior authorization for [name]. Authorization has been APPROVED from 02/22/21 to 02/22/22.   Authorization # [id number]  Patient's copay for 1 month is $1025.81. Patient assistance application is pending.

## 2021-03-02 NOTE — Telephone Encounter (Signed)
Received a fax from  [facility] to [place] regarding an approval for [name] Pen patient assistance from 03/01/21 to 12/02/21. Medication will be shipped from [facility] order.   Phone number: [phone]

## 2021-03-03 NOTE — Telephone Encounter (Signed)
[name] new start appointment scheduled for 03/06/21 @ 1:20pm. Patient aware of office policy and Epipen requirement. She has on hand. Patient aware of 2 hour monitoring period

## 2021-03-05 NOTE — Progress Notes (Deleted)
HPI Patient presents today to [facility] to see pharmacy team for [facility] new start visit.  Past medical history includes ***  Number of hospitalizations in past year: Number of ***COPD/asthma exacerbations in past year:   Respiratory Medications Current: Breo Ellipta, Spiriva Respimat Tried in past: *** Patient reports {Adherence challenges yes [id number]::"OIZTIWPYK challenges","no known adherence challenges"}  OBJECTIVE Allergies  Allergen Reactions  . Latex   . Sulfa Antibiotics Rash    Outpatient Encounter Medications as of 03/06/2021  Medication Sig  . albuterol (PROVENTIL) (2.5 MG/3ML) 0.083% nebulizer solution Take 3 mLs (2.5 mg total) by nebulization every 6 (six) hours as needed for wheezing or shortness of breath.  Marland Kitchen albuterol (VENTOLIN HFA) 108 (90 Base) MCG/ACT inhaler Inhale 2 puffs into the lungs every 6 (six) hours as needed for wheezing.  Marland Kitchen allopurinol (ZYLOPRIM) 300 MG tablet Take 1 tablet (300 mg total) by mouth 2 (two) times daily.  Marland Kitchen apixaban (ELIQUIS) 5 MG TABS tablet Take 1 tablet (5 mg total) by mouth 2 (two) times daily.  Marland Kitchen atenolol (TENORMIN) 100 MG tablet Take 1 tablet (100 mg total) by mouth daily.  Marland Kitchen atorvastatin (LIPITOR) 80 MG tablet Take 1 tablet (80 mg total) by mouth at bedtime.  . blood glucose meter kit and supplies KIT Check morning fasting blood glucose daily and up to 4 times daily as directed  . COLCRYS 0.6 MG tablet TAKE 1 TABLET BY MOUTH  DAILY AS NEEDED  . Cyanocobalamin (B-12 PO) Take 1,000 mg by mouth daily.  . Dulaglutide (TRULICITY) 1.5 DX/8.3JA SOPN Inject 1.5 mg into the skin once a week.  . fluticasone (FLONASE) 50 MCG/ACT nasal spray Place 1 spray into both nostrils daily.  . fluticasone furoate-vilanterol (BREO ELLIPTA) 200-25 MCG/INH AEPB Inhale 1 puff into the lungs daily. Needs appt for further refills  . glucose blood test strip To be used twice daily for testing blood sugars. E11.65  . ipratropium-albuterol (DUONEB)  0.5-2.5 (3) MG/3ML SOLN Take 3 mLs by nebulization every 6 (six) hours as needed.  . irbesartan-hydrochlorothiazide (AVALIDE) 300-12.5 MG tablet Take 1 tablet by mouth daily.  Marland Kitchen levalbuterol (XOPENEX HFA) 45 MCG/ACT inhaler Inhale 1-2 puffs into the lungs every 6 (six) hours as needed for wheezing or shortness of breath.  . levocetirizine (XYZAL ALLERGY 24HR) 5 MG tablet Take 1 tablet (5 mg total) by mouth every evening.  Marland Kitchen levothyroxine (SYNTHROID) 112 MCG tablet Take 1 tablet (112 mcg total) by mouth daily before breakfast.  . metFORMIN (GLUCOPHAGE) 1000 MG tablet Take 1 tablet (1,000 mg total) by mouth 2 (two) times daily with a meal.  . montelukast (SINGULAIR) 10 MG tablet Take 1 tablet (10 mg total) by mouth at bedtime.  . nitroGLYCERIN (NITROSTAT) 0.4 MG SL tablet Place 1 tablet (0.4 mg total) under the tongue every 5 (five) minutes as needed for chest pain (or tightness).  . Omega-3 Fatty Acids (FISH OIL PO) Take 5,000 Units by mouth daily.  Marland Kitchen SPIRIVA RESPIMAT 2.5 MCG/ACT AERS Inhale 2 puffs into the lungs daily.   No facility-administered encounter medications on file as of 03/06/2021.     Immunization History  Administered Date(s) Administered  . Fluad Quad(high Dose 65+) 09/22/2019  . Influenza, High Dose Seasonal PF 08/29/2017, 09/07/2018, 08/13/2020  . PFIZER(Purple Top)SARS-COV-2 Vaccination 12/13/2019, 01/04/2020, 09/03/2020  . Pneumococcal Conjugate-13 11/16/2019  . Pneumococcal Polysaccharide-23 08/29/2017  . Tdap 05/29/2017  . Zoster Recombinat (Shingrix) 02/28/2018, 06/12/2018     PFTs PFT Results Latest Ref Rng & Units 11/17/2020  01/30/2018  FVC-Pre L 1.75 1.74  FVC-Predicted Pre % 63 60  FVC-Post L 1.74 1.80  FVC-Predicted Post % 62 63  Pre FEV1/FVC % % 67 67  Post FEV1/FCV % % 67 69  FEV1-Pre L 1.17 1.17  FEV1-Predicted Pre % 56 54  FEV1-Post L 1.17 1.24  DLCO uncorrected ml/min/mmHg - 20.06  DLCO UNC% % - 82  DLCO corrected ml/min/mmHg - 20.45  DLCO COR  %Predicted % - 84  DLVA Predicted % - 118  TLC L - 4.04  TLC % Predicted % - 80  RV % Predicted % - 87     Eosinophils Most recent blood eosinophil count was *** cells/microL taken on ***.   IgE   Assessment   1. Biologics training (***)  . Mepolizumab ([name]) o MOA: Not fully understood. It does act an interleukin-5 (IL-5) antagonist monoclonal antibody that reduces the production and survival of eosinophils by blocking the binding of IL-5 to the alpha chain of the receptor complex on the eosinophil cell surface. o Response to therapy: ??? o Side effects: headache (19%), injection site reaxtion (7-15%), antibody development (6%), backache (5%), fatigue (5%) o Dosing: 100-300 mg subQ every 4 weeks o Administration:  1.  For the 300 mg dose, administer as 3 separate 100 mg injections into the upper arm, thigh, or abdomen ?5 cm (~2 inches) apart if >1 injection administered at same site.  2. Do not shake the reconstituted solution as this could lead to product foaming or precipitation. 3. The solution should be clear to opalescent and colorless to pale yellow or pale brown, essentially particle free. Small air bubbles, however, are expected and acceptable. If particulate matter remains in the solution or if the solution appears cloudy or milky, discard the solution.    2. Medication Reconciliation  A drug regimen assessment was performed, including review of allergies, interactions, disease-state management, dosing and immunization history. Medications were reviewed with the patient, including name, instructions, indication, goals of therapy, potential side effects, importance of adherence, and safe use.  Drug interaction(s): ***  3. Immunizations  UTD on COVID19 vaccines - eligible for 2nd booster UTD on influenza, pneumonia, and shingles vaccinations  PLAN - Continue [name]  - Ctoniue Breo Ellipta, levocetirizine, Spiriva Respimat, fluticasone nasal spray as prescribed.  Switch to Trelegy? (run test claim) - F/u in 2-3 months with Dr. Vaughan Browner or APP  All questions encouraged and answered.  Instructed patient to reach out with any further questions or concerns.  Thank you for allowing pharmacy to participate in this patient's care.  Knox Saliva, PharmD, MPH Clinical Pharmacist (Rheumatology and Pulmonology)

## 2021-03-06 ENCOUNTER — Other Ambulatory Visit (HOSPITAL_COMMUNITY): Payer: Self-pay

## 2021-03-06 ENCOUNTER — Other Ambulatory Visit: Payer: PPO | Admitting: Pharmacist

## 2021-03-06 ENCOUNTER — Telehealth: Payer: Self-pay | Admitting: Pharmacist

## 2021-03-06 ENCOUNTER — Other Ambulatory Visit: Payer: Self-pay | Admitting: Family Medicine

## 2021-03-06 DIAGNOSIS — Z79899 Other long term (current) drug therapy: Secondary | ICD-10-CM

## 2021-03-06 DIAGNOSIS — J4551 Severe persistent asthma with (acute) exacerbation: Secondary | ICD-10-CM

## 2021-03-06 MED ORDER — EPINEPHRINE 0.3 MG/0.3ML IJ SOAJ: 0.3000 mg | INTRAMUSCULAR | 2 refills | Status: DC | PRN

## 2021-03-06 NOTE — Telephone Encounter (Signed)
Epipen prescription sent to [facility]. Advised patient that Epipen is required for [name] new start and if she is unable to pick up the Epipen, we can r/s visit. Pt verbalized understanding.

## 2021-03-06 NOTE — Telephone Encounter (Signed)
Patient r/s [name] new start to 03/08/21 @ 2:20pm. She was unable to pick up Epipen today, but will do so later this afternoon or tomorrow.

## 2021-03-08 ENCOUNTER — Ambulatory Visit: Payer: PPO | Admitting: Pharmacist

## 2021-03-08 ENCOUNTER — Other Ambulatory Visit: Payer: Self-pay

## 2021-03-08 ENCOUNTER — Other Ambulatory Visit (HOSPITAL_COMMUNITY): Payer: Self-pay

## 2021-03-08 DIAGNOSIS — J4551 Severe persistent asthma with (acute) exacerbation: Secondary | ICD-10-CM

## 2021-03-08 MED ORDER — NUCALA 100 MG/ML ~~LOC~~ SOAJ: 100.0000 mg | SUBCUTANEOUS | 5 refills | Status: DC

## 2021-03-08 NOTE — Patient Instructions (Addendum)
Your next [name] dose is due on 04/05/21, 05/03/21, and every 4 weeks thereafter. Set a reminder in your phone!  Your prescription will be shipped from [facility] [facility] [employer] via the [employer] patient assistance program. Please call to schedule shipment and confirm address. They will mail medication to your home.  If you'd like, you can call your insurance to see if Trelegy Ellipta inhaler will be covered. Otherwise, you can call me when you are about due for your Spiriva/Breo refills and I may be able to run a test claim for the Trelegy for you.  Remember the 5 C's:  COUNTER - leave on the counter at least 30 minutes but up to overnight to bring medication to room temperature. This may help prevent stinging  COLD - place something cold (like an ice gel pack or cold water bottle) on the injection site just before cleansing with alcohol. This may help reduce pain  CLARITIN - use Claritin (generic name is loratadine) for the first two weeks of treatment or the day of, the day before, and the day after injecting. This will help to minimize injection site reactions  CORTISONE CREAM - apply if injection site is irritated and itching  CALL ME - if injection site reaction is bigger than the size of your fist, looks infected, blisters, or if you develop hives     Anaphylactic Reactions  Anaphylactic reactions can occur up to 24 hours after administration.  What are the signs and symptoms of anaphylaxis?  Symptoms can vary from a mild skin reaction to more severe reactions including: . Wheezing, shortness of breath, cough, chest tightness or trouble breathing . Low blood pressure, dizziness, fainting, rapid of weak heartbeat, anxiety, or feeling of "impending doom" . Flushing, itching, hives, or feeling warm . Swelling of the throat or tongue, throat tightness, hoarse voice, or trouble swallowing  Some of these symptoms require immediate treatment, as they can be life threatening.  If you  experience severe symptoms which are bolded above use your Epipen and call 911 for immediate emergency care.

## 2021-03-08 NOTE — Progress Notes (Signed)
HPI Patient presents today to [facility] to see pharmacy team for [facility] new start appointment.  Past medical history includes severe persistent asthma, nasal polyposis, and allergies.  Patient takes Trulicity injections and is comfortable with self-administering.   Respiratory Medications Current: Breo Ellipta, Spiriva, montelukast, cetirizine, fluticasone nasal spray Patient reports no known adherence challenges  OBJECTIVE Allergies  Allergen Reactions  . Latex   . Sulfa Antibiotics Rash    Outpatient Encounter Medications as of 03/08/2021  Medication Sig  . albuterol (PROVENTIL) (2.5 MG/3ML) 0.083% nebulizer solution Take 3 mLs (2.5 mg total) by nebulization every 6 (six) hours as needed for wheezing or shortness of breath.  Marland Kitchen albuterol (VENTOLIN HFA) 108 (90 Base) MCG/ACT inhaler Inhale 2 puffs into the lungs every 6 (six) hours as needed for wheezing.  Marland Kitchen allopurinol (ZYLOPRIM) 300 MG tablet Take 1 tablet (300 mg total) by mouth 2 (two) times daily.  Marland Kitchen apixaban (ELIQUIS) 5 MG TABS tablet Take 1 tablet (5 mg total) by mouth 2 (two) times daily.  Marland Kitchen atenolol (TENORMIN) 100 MG tablet Take 1 tablet (100 mg total) by mouth daily.  Marland Kitchen atorvastatin (LIPITOR) 80 MG tablet Take 1 tablet (80 mg total) by mouth at bedtime.  . blood glucose meter kit and supplies KIT Check morning fasting blood glucose daily and up to 4 times daily as directed  . COLCRYS 0.6 MG tablet TAKE 1 TABLET BY MOUTH  DAILY AS NEEDED  . Cyanocobalamin (B-12 PO) Take 1,000 mg by mouth daily.  . Dulaglutide (TRULICITY) 1.5 IN/8.6VE SOPN Inject 1.5 mg into the skin once a week.  Marland Kitchen EPINEPHrine 0.3 mg/0.3 mL IJ SOAJ injection Inject 0.3 mg into the muscle as needed for anaphylaxis. Call 9-1-1 after use.  . fluticasone (FLONASE) 50 MCG/ACT nasal spray Place 1 spray into both nostrils daily.  . fluticasone furoate-vilanterol (BREO ELLIPTA) 200-25 MCG/INH AEPB Inhale 1 puff into the lungs daily. Needs appt for further  refills  . glucose blood test strip To be used twice daily for testing blood sugars. E11.65  . ipratropium-albuterol (DUONEB) 0.5-2.5 (3) MG/3ML SOLN Take 3 mLs by nebulization every 6 (six) hours as needed.  . irbesartan-hydrochlorothiazide (AVALIDE) 300-12.5 MG tablet Take 1 tablet by mouth daily.  Marland Kitchen levalbuterol (XOPENEX HFA) 45 MCG/ACT inhaler Inhale 1-2 puffs into the lungs every 6 (six) hours as needed for wheezing or shortness of breath.  . levocetirizine (XYZAL ALLERGY 24HR) 5 MG tablet Take 1 tablet (5 mg total) by mouth every evening.  Marland Kitchen levothyroxine (SYNTHROID) 112 MCG tablet Take 1 tablet (112 mcg total) by mouth daily before breakfast.  . metFORMIN (GLUCOPHAGE) 1000 MG tablet Take 1 tablet (1,000 mg total) by mouth 2 (two) times daily with a meal.  . montelukast (SINGULAIR) 10 MG tablet Take 1 tablet (10 mg total) by mouth at bedtime.  . nitroGLYCERIN (NITROSTAT) 0.4 MG SL tablet Place 1 tablet (0.4 mg total) under the tongue every 5 (five) minutes as needed for chest pain (or tightness).  . Omega-3 Fatty Acids (FISH OIL PO) Take 5,000 Units by mouth daily.  Marland Kitchen SPIRIVA RESPIMAT 2.5 MCG/ACT AERS Inhale 2 puffs into the lungs daily.   No facility-administered encounter medications on file as of 03/08/2021.     Immunization History  Administered Date(s) Administered  . Fluad Quad(high Dose 65+) 09/22/2019  . Influenza, High Dose Seasonal PF 08/29/2017, 09/07/2018, 08/13/2020  . PFIZER(Purple Top)SARS-COV-2 Vaccination 12/13/2019, 01/04/2020, 09/03/2020  . Pneumococcal Conjugate-13 11/16/2019  . Pneumococcal Polysaccharide-23 08/29/2017  . Tdap  05/29/2017  . Zoster Recombinat (Shingrix) 02/28/2018, 06/12/2018     PFTs PFT Results Latest Ref Rng & Units 11/17/2020 01/30/2018  FVC-Pre L 1.75 1.74  FVC-Predicted Pre % 63 60  FVC-Post L 1.74 1.80  FVC-Predicted Post % 62 63  Pre FEV1/FVC % % 67 67  Post FEV1/FCV % % 67 69  FEV1-Pre L 1.17 1.17  FEV1-Predicted Pre % 56 54   FEV1-Post L 1.17 1.24  DLCO uncorrected ml/min/mmHg - 20.06  DLCO UNC% % - 82  DLCO corrected ml/min/mmHg - 20.45  DLCO COR %Predicted % - 84  DLVA Predicted % - 118  TLC L - 4.04  TLC % Predicted % - 80  RV % Predicted % - 87     Eosinophil Most recent blood eosinophil count was 1000 cells/microL taken on 02/21/21.   IgE 1191 on 02/21/21  Assessment   1. Biologics training (Mepolizumab ([name]) o MOA: Not fully understood. It does act an interleukin-5 (IL-5) antagonist monoclonal antibody that reduces the production and survival of eosinophils by blocking the binding of IL-5 to the alpha chain of the receptor complex on the eosinophil cell surface. o Response to therapy: ??? o Side effects: headache (19%), injection site reaction (7-15%), antibody development (6%), backache (5%), fatigue (5%) o Dosing: 100 mg subQ every 4 weeks o Administration:  1.  For the 300 mg dose, administer as 3 separate 100 mg injections into the upper arm, thigh, or abdomen ?5 cm (~2 inches) apart if >1 injection administered at same site.  2. Do not shake the reconstituted solution as this could lead to product foaming or precipitation. 3. The solution should be clear to opalescent and colorless to pale yellow or pale brown, essentially particle free. Small air bubbles, however, are expected and acceptable. If particulate matter remains in the solution or if the solution appears cloudy or milky, discard the solution.   Patient able to self-administer in the right thigh using [name] sample. Medication: [name] 149m/mL NDC: [id number]: [id number]: 04/2022  2. Medication Reconciliation  A drug regimen assessment was performed, including review of allergies, interactions, disease-state management, dosing and immunization history. Medications were reviewed with the patient, including name, instructions, indication, goals of therapy, potential side effects, importance of adherence, and safe  use.  Discussed that [name] is an add-on and escalation of therapy and she should continue maintenance regimen for optimal effectiveness of [name]. Discussed that rescue inhaler use, steroid need, and pulmonary function can be used to assess effectiveness of medication.  Drug interaction(s): none identified  3. Immunizations  Patient is UTD on influenzae, pneumonia, and shingles vaccinations. She will plan to get second COVID19 booster in 2 weeks between [name] doses.  PLAN  Continue [name] every 4 weeks.  Patient will set a reminder in her phone.  Continue your maintenance inhaler and asthma regimen: fluticasone nasal spray, Breo Ellipta daily, Spiriva Respimat 2 puffs daily. Unable to run test claim for Trelegy today (patient just filled inhalers on 03/06/21), but this would be an appropriate option for her to consolidate her inhaler and number of daily puffs. She will try to call insurance to see if it would be covered but plan is to discuss further with Dr. MVaughan Brownerat f/u before making this change.  F/u to be scheduled with Dr. MVaughan Browneror APP in 2-3 months  All questions encouraged and answered.  Instructed patient to reach out with any further questions or concerns.  Thank you for allowing pharmacy to participate in this  patient's care.  Knox Saliva, PharmD, MPH Clinical Pharmacist (Rheumatology and Pulmonology)

## 2021-03-22 DIAGNOSIS — E039 Hypothyroidism, unspecified: Secondary | ICD-10-CM | POA: Diagnosis not present

## 2021-03-22 DIAGNOSIS — M1A0790 Idiopathic chronic gout, unspecified ankle and foot, without tophus (tophi): Secondary | ICD-10-CM | POA: Diagnosis not present

## 2021-03-22 DIAGNOSIS — E1165 Type 2 diabetes mellitus with hyperglycemia: Secondary | ICD-10-CM | POA: Diagnosis not present

## 2021-03-23 LAB — COMPLETE METABOLIC PANEL WITH GFR
AG Ratio: 1.7 (calc) (ref 1.0–2.5)
ALT: 16 U/L (ref 6–29)
AST: 13 U/L (ref 10–35)
Albumin: 4.4 g/dL (ref 3.6–5.1)
Alkaline phosphatase (APISO): 128 U/L (ref 37–153)
BUN: 14 mg/dL (ref 7–25)
CO2: 26 mmol/L (ref 20–32)
Calcium: 10.2 mg/dL (ref 8.6–10.4)
Chloride: 101 mmol/L (ref 98–110)
Creat: 0.85 mg/dL (ref 0.60–0.93)
GFR, Est African American: 77 mL/min/{1.73_m2} (ref >=60)
GFR, Est Non African American: 67 mL/min/{1.73_m2} (ref >=60)
Globulin: 2.6 g/dL (calc) (ref 1.9–3.7)
Glucose, Bld: 187 mg/dL — ABNORMAL HIGH (ref 65–99)
Potassium: 4.5 mmol/L (ref 3.5–5.3)
Sodium: 138 mmol/L (ref 135–146)
Total Bilirubin: 0.5 mg/dL (ref 0.2–1.2)
Total Protein: 7 g/dL (ref 6.1–8.1)

## 2021-03-23 LAB — LIPID PANEL
Cholesterol: 266 mg/dL — ABNORMAL HIGH (ref <200)
HDL: 56 mg/dL (ref >=50)
LDL Cholesterol (Calc): 165 mg/dL (calc) — ABNORMAL HIGH
Non-HDL Cholesterol (Calc): 210 mg/dL (calc) — ABNORMAL HIGH (ref <130)
Total CHOL/HDL Ratio: 4.8 (calc) (ref <5.0)
Triglycerides: 273 mg/dL — ABNORMAL HIGH (ref <150)

## 2021-03-23 LAB — CBC
HCT: 41.3 % (ref 35.0–45.0)
Hemoglobin: 13.9 g/dL (ref 11.7–15.5)
MCH: 29 pg (ref 27.0–33.0)
MCHC: 33.7 g/dL (ref 32.0–36.0)
MCV: 86 fL (ref 80.0–100.0)
MPV: 10.7 fL (ref 7.5–12.5)
Platelets: 313 10*3/uL (ref 140–400)
RBC: 4.8 10*6/uL (ref 3.80–5.10)
RDW: 12.7 % (ref 11.0–15.0)
WBC: 6.8 10*3/uL (ref 3.8–10.8)

## 2021-03-23 LAB — URIC ACID: Uric Acid, Serum: 6.5 mg/dL (ref 2.5–7.0)

## 2021-03-23 LAB — TSH: TSH: 3.15 mIU/L (ref 0.40–4.50)

## 2021-03-24 ENCOUNTER — Ambulatory Visit (INDEPENDENT_AMBULATORY_CARE_PROVIDER_SITE_OTHER): Payer: PPO | Admitting: Family Medicine

## 2021-03-24 ENCOUNTER — Encounter: Payer: Self-pay | Admitting: Family Medicine

## 2021-03-24 ENCOUNTER — Other Ambulatory Visit: Payer: Self-pay

## 2021-03-24 ENCOUNTER — Other Ambulatory Visit: Payer: Self-pay | Admitting: *Deleted

## 2021-03-24 VITALS — BP 132/76 | HR 95 | Ht 64.0 in | Wt 193.0 lb

## 2021-03-24 DIAGNOSIS — M1A0790 Idiopathic chronic gout, unspecified ankle and foot, without tophus (tophi): Secondary | ICD-10-CM

## 2021-03-24 DIAGNOSIS — E785 Hyperlipidemia, unspecified: Secondary | ICD-10-CM

## 2021-03-24 DIAGNOSIS — E118 Type 2 diabetes mellitus with unspecified complications: Secondary | ICD-10-CM | POA: Diagnosis not present

## 2021-03-24 DIAGNOSIS — I10 Essential (primary) hypertension: Secondary | ICD-10-CM

## 2021-03-24 DIAGNOSIS — E1169 Type 2 diabetes mellitus with other specified complication: Secondary | ICD-10-CM

## 2021-03-24 DIAGNOSIS — I2510 Atherosclerotic heart disease of native coronary artery without angina pectoris: Secondary | ICD-10-CM

## 2021-03-24 DIAGNOSIS — E782 Mixed hyperlipidemia: Secondary | ICD-10-CM

## 2021-03-24 DIAGNOSIS — E1165 Type 2 diabetes mellitus with hyperglycemia: Secondary | ICD-10-CM

## 2021-03-24 DIAGNOSIS — E1159 Type 2 diabetes mellitus with other circulatory complications: Secondary | ICD-10-CM

## 2021-03-24 DIAGNOSIS — I480 Paroxysmal atrial fibrillation: Secondary | ICD-10-CM

## 2021-03-24 DIAGNOSIS — E039 Hypothyroidism, unspecified: Secondary | ICD-10-CM | POA: Diagnosis not present

## 2021-03-24 DIAGNOSIS — I152 Hypertension secondary to endocrine disorders: Secondary | ICD-10-CM | POA: Diagnosis not present

## 2021-03-24 DIAGNOSIS — J4550 Severe persistent asthma, uncomplicated: Secondary | ICD-10-CM | POA: Diagnosis not present

## 2021-03-24 LAB — POCT GLYCOSYLATED HEMOGLOBIN (HGB A1C): Hemoglobin A1C: 8.7 % — AB (ref 4.0–5.6)

## 2021-03-24 MED ORDER — IRBESARTAN-HYDROCHLOROTHIAZIDE 300-12.5 MG PO TABS: 1.0000 | ORAL_TABLET | Freq: Every day | ORAL | 1 refills | Status: DC

## 2021-03-24 MED ORDER — ALLOPURINOL 300 MG PO TABS
300.0000 mg | ORAL_TABLET | Freq: Two times a day (BID) | ORAL | 1 refills | Status: DC
Start: 2021-03-24 — End: 2021-09-08

## 2021-03-24 MED ORDER — METFORMIN HCL 1000 MG PO TABS: 1000.0000 mg | ORAL_TABLET | Freq: Two times a day (BID) | ORAL | 1 refills | Status: DC

## 2021-03-24 MED ORDER — LEVOTHYROXINE SODIUM 112 MCG PO TABS: 112.0000 ug | ORAL_TABLET | Freq: Every day | ORAL | 1 refills | Status: DC

## 2021-03-24 MED ORDER — APIXABAN 5 MG PO TABS: 5.0000 mg | ORAL_TABLET | Freq: Two times a day (BID) | ORAL | 1 refills | Status: DC

## 2021-03-24 MED ORDER — TRULICITY 1.5 MG/0.5ML ~~LOC~~ SOAJ: 1.5000 mg | SUBCUTANEOUS | 5 refills | Status: DC

## 2021-03-24 MED ORDER — ATORVASTATIN CALCIUM 80 MG PO TABS: 80.0000 mg | ORAL_TABLET | Freq: Every day | ORAL | 3 refills | Status: DC

## 2021-03-24 MED ORDER — ATENOLOL 100 MG PO TABS: 100.0000 mg | ORAL_TABLET | Freq: Every day | ORAL | 1 refills | Status: DC

## 2021-03-24 NOTE — Progress Notes (Signed)
Established Patient Office Visit  Subjective:  Patient ID: Tara Campbell, female    DOB: 08-10-1946  Age: 75 y.o. MRN: [id number]  CC:  Chief Complaint  Patient presents with  . Diabetes    HPI Tara Campbell presents for   Hypertension- Pt denies chest pain, SOB, dizziness, or heart palpitations.  Taking meds as directed w/o problems.  Denies medication side effects.    F/U Afib - she is on Eliquis and atenolol   Follow-up hyperlipidemia-she reports that she actually has not been on her statin for months.  She was not really having any side effects.  She is on it for prior heart attack.  She just got tired of taking so many pills.  One of the things that she would like to discuss is her pill burden  Past Medical History:  Diagnosis Date  . Allergy   . Arthritis   . Asthma   . Colon polyps   . Diabetes mellitus without complication ([place])   . Gout   . Heart attack ([place]) 07/30/2016   PCI, 2 stents  . Hyperlipidemia   . Hypertension   . Internal hemorrhoids   . Left rotator cuff tear   . 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 Otomycosis of right ear 09/27/2017  . Paroxysmal atrial fibrillation (HCC)   . Thyroid disease     Past Surgical History:  Procedure Laterality Date  . ANGIOPLASTY    . COLONOSCOPY W/ POLYPECTOMY  01/04/2015  . HEMORRHOID BANDING      Family History  Problem Relation Age of Onset  . Hypertension Mother   . Hyperlipidemia Mother   . Glaucoma Father   . Heart disease Father   . Hypertension Father   . Diabetes Father   . Hyperlipidemia Father   . Skin cancer Father   . Diabetes Paternal Grandmother   . Colon cancer Neg Hx   . Esophageal cancer Neg Hx     Social History   Socioeconomic History  . Marital status: Divorced    Spouse name: Not on file  . Number of children: 2  . Years of education: Not on file  . Highest education level: Not on file  Occupational History  . Not on file  Tobacco Use  . Smoking status: Never  Smoker  . Smokeless tobacco: Never Used  Vaping Use  . Vaping Use: Never used  Substance and Sexual Activity  . Alcohol use: Yes    Comment: Rare  . Drug use: No  . Sexual activity: Not Currently  Other Topics Concern  . Not on file  Social History Narrative  . Not on file   Social Determinants of Health   Financial Resource Strain: Not on file  Food Insecurity: Not on file  Transportation Needs: Not on file  Physical Activity: Not on file  Stress: Not on file  Social Connections: Not on file  Intimate Partner Violence: Not on file    Outpatient Medications Prior to Visit  Medication Sig Dispense Refill  . albuterol (VENTOLIN HFA) 108 (90 Base) MCG/ACT inhaler Inhale 2 puffs into the lungs every 6 (six) hours as needed for wheezing. 18 g prn  . apixaban (ELIQUIS) 5 MG TABS tablet Take 1 tablet (5 mg total) by mouth 2 (two) times daily. 180 tablet 1  . atorvastatin (LIPITOR) 80 MG tablet Take 1 tablet (80 mg total) by mouth at bedtime. 90 tablet 3  . blood glucose meter kit and supplies KIT Check morning fasting  blood glucose daily and up to 4 times daily as directed 1 each 0  . COLCRYS 0.6 MG tablet TAKE 1 TABLET BY MOUTH  DAILY AS NEEDED 30 tablet 11  . Cyanocobalamin (B-12 PO) Take 1,000 mg by mouth daily.    Marland Kitchen EPINEPHrine 0.3 mg/0.3 mL IJ SOAJ injection Inject 0.3 mg into the muscle as needed for anaphylaxis. Call 9-1-1 after use. 1 each 2  . fluticasone (FLONASE) 50 MCG/ACT nasal spray Place 1 spray into both nostrils daily. 16 g 3  . fluticasone furoate-vilanterol (BREO ELLIPTA) 200-25 MCG/INH AEPB Inhale 1 puff into the lungs daily. Needs appt for further refills 60 each 6  . glucose blood test strip To be used twice daily for testing blood sugars. E11.65 200 each 11  . ipratropium-albuterol (DUONEB) 0.5-2.5 (3) MG/3ML SOLN Take 3 mLs by nebulization every 6 (six) hours as needed. 3 mL PRN  . levalbuterol (XOPENEX HFA) 45 MCG/ACT inhaler Inhale 1-2 puffs into the lungs  every 6 (six) hours as needed for wheezing or shortness of breath.    . levocetirizine (XYZAL ALLERGY 24HR) 5 MG tablet Take 1 tablet (5 mg total) by mouth every evening. 90 tablet 3  . Mepolizumab ([name]) 100 MG/ML SOAJ Inject 1 mL (100 mg total) into the skin every 28 (twenty-eight) days. 1 mL 5  . montelukast (SINGULAIR) 10 MG tablet Take 1 tablet (10 mg total) by mouth at bedtime. 90 tablet 3  . nitroGLYCERIN (NITROSTAT) 0.4 MG SL tablet Place 1 tablet (0.4 mg total) under the tongue every 5 (five) minutes as needed for chest pain (or tightness). 30 tablet 0  . Omega-3 Fatty Acids (FISH OIL PO) Take 5,000 Units by mouth daily.    Marland Kitchen SPIRIVA RESPIMAT 2.5 MCG/ACT AERS Inhale 2 puffs into the lungs daily. 4 g 5  . albuterol (PROVENTIL) (2.5 MG/3ML) 0.083% nebulizer solution Take 3 mLs (2.5 mg total) by nebulization every 6 (six) hours as needed for wheezing or shortness of breath. 75 mL 12  . allopurinol (ZYLOPRIM) 300 MG tablet Take 1 tablet (300 mg total) by mouth 2 (two) times daily. 180 tablet 1  . atenolol (TENORMIN) 100 MG tablet Take 1 tablet (100 mg total) by mouth daily. 90 tablet 1  . Dulaglutide (TRULICITY) 1.5 CB/7.6EG SOPN Inject 1.5 mg into the skin once a week. 2 mL 5  . irbesartan-hydrochlorothiazide (AVALIDE) 300-12.5 MG tablet Take 1 tablet by mouth daily. 90 tablet 1  . levothyroxine (SYNTHROID) 112 MCG tablet Take 1 tablet (112 mcg total) by mouth daily before breakfast. 90 tablet 1  . metFORMIN (GLUCOPHAGE) 1000 MG tablet Take 1 tablet (1,000 mg total) by mouth 2 (two) times daily with a meal. 180 tablet 3   No facility-administered medications prior to visit.    Allergies  Allergen Reactions  . Latex   . Sulfa Antibiotics Rash    ROS Review of Systems    Objective:    Physical Exam Constitutional:      Appearance: She is well-developed.  HENT:     Head: Normocephalic and atraumatic.  Cardiovascular:     Rate and Rhythm: Normal rate and regular rhythm.      Heart sounds: Normal heart sounds.  Pulmonary:     Effort: Pulmonary effort is normal.     Breath sounds: Normal breath sounds.  Skin:    General: Skin is warm and dry.  Neurological:     Mental Status: She is alert and oriented to person, place, and time.  Psychiatric:        Behavior: Behavior normal.     BP 132/76   Pulse 95   Ht '5\' 4"'  (1.626 m)   Wt 193 lb (87.5 kg)   SpO2 97%   BMI 33.13 kg/m  Wt Readings from Last 3 Encounters:  03/24/21 193 lb (87.5 kg)  02/21/21 193 lb (87.5 kg)  01/24/21 190 lb 12.8 oz (86.5 kg)     Health Maintenance Due  Topic Date Due  . OPHTHALMOLOGY EXAM  12/19/2019  . COLONOSCOPY (Pts 45-77yr Insurance coverage will need to be confirmed)  02/01/2020    There are no preventive care reminders to display for this patient.  Lab Results  Component Value Date   TSH 3.15 03/22/2021   Lab Results  Component Value Date   WBC 6.8 03/22/2021   HGB 13.9 03/22/2021   HCT 41.3 03/22/2021   MCV 86.0 03/22/2021   PLT 313 03/22/2021   Lab Results  Component Value Date   NA 138 03/22/2021   K 4.5 03/22/2021   CO2 26 03/22/2021   GLUCOSE 187 (H) 03/22/2021   BUN 14 03/22/2021   CREATININE 0.85 03/22/2021   BILITOT 0.5 03/22/2021   ALKPHOS 100 05/29/2017   AST 13 03/22/2021   ALT 16 03/22/2021   PROT 7.0 03/22/2021   ALBUMIN 4.2 05/29/2017   CALCIUM 10.2 03/22/2021   ANIONGAP 12 03/07/2018   Lab Results  Component Value Date   CHOL 266 (H) 03/22/2021   Lab Results  Component Value Date   HDL 56 03/22/2021   Lab Results  Component Value Date   LDLCALC 165 (H) 03/22/2021   Lab Results  Component Value Date   TRIG 273 (H) 03/22/2021   Lab Results  Component Value Date   CHOLHDL 4.8 03/22/2021   Lab Results  Component Value Date   HGBA1C 8.7 (A) 03/24/2021      Assessment & Plan:   Problem List Items Addressed This Visit      Cardiovascular and Mediastinum   Paroxysmal atrial fibrillation (HCC)   Relevant  Medications   atenolol (TENORMIN) 100 MG tablet   irbesartan-hydrochlorothiazide (AVALIDE) 300-12.5 MG tablet   Hypertension goal BP (blood pressure) < 140/90   Relevant Medications   atenolol (TENORMIN) 100 MG tablet   irbesartan-hydrochlorothiazide (AVALIDE) 300-12.5 MG tablet   Coronary artery disease involving native coronary artery of native heart without angina pectoris    History of MI. Continue BB and encouraged to restart Statin.       Relevant Medications   atenolol (TENORMIN) 100 MG tablet   irbesartan-hydrochlorothiazide (AVALIDE) 300-12.5 MG tablet     Respiratory   Severe persistent allergic asthma    Follows with Allergy.  Recently started [name].  Could consider stopping Singulair if not helping.  Check with insurance to see if [place]       Endocrine   Mixed diabetic hyperlipidemia associated with type 2 diabetes mellitus ([place]   Relevant Medications   atenolol (TENORMIN) 100 MG tablet   Dulaglutide (TRULICITY) 1.5 MJT/7.0VXSOPN   irbesartan-hydrochlorothiazide (AVALIDE) 300-12.5 MG tablet   metFORMIN (GLUCOPHAGE) 1000 MG tablet   Controlled diabetes mellitus type 2 with complications ([place] [facility]    A1C jumped up significantly we discussed changing the trulicity due to cost. She will check with insurance. Given list of other drugs.   Lab Results  Component Value Date   HGBA1C 8.7 (A) 03/24/2021  Relevant Medications   Dulaglutide (TRULICITY) 1.5 BT/5.1VO SOPN   irbesartan-hydrochlorothiazide (AVALIDE) 300-12.5 MG tablet   metFORMIN (GLUCOPHAGE) 1000 MG tablet   Other Relevant Orders   POCT glycosylated hemoglobin (Hb A1C) (Completed)   Acquired hypothyroidism    Thyroid Is OK. We do have some room for slight adjustment if needed.    Lab Results  Component Value Date   TSH 3.15 03/22/2021         Relevant Medications   atenolol (TENORMIN) 100 MG tablet   levothyroxine (SYNTHROID) 112 MCG  tablet     Musculoskeletal and Integument   Gout of ankle    Last uric acid  6.5. discussed working on low purine diet so can possibly decrease allopurinol dose to once a day.  OK to just use Colcrys PRN instead of daily.    Lab Results  Component Value Date   LABURIC 6.5 03/22/2021         Relevant Medications   allopurinol (ZYLOPRIM) 300 MG tablet    Other Visit Diagnoses    Hypertension associated with diabetes ([place])       Relevant Medications   atenolol (TENORMIN) 100 MG tablet   Dulaglutide (TRULICITY) 1.5 HY/0.7PX SOPN   irbesartan-hydrochlorothiazide (AVALIDE) 300-12.5 MG tablet   metFORMIN (GLUCOPHAGE) 1000 MG tablet   Uncontrolled type 2 diabetes mellitus with hyperglycemia (HCC)       Relevant Medications   Dulaglutide (TRULICITY) 1.5 TG/6.2IR SOPN   irbesartan-hydrochlorothiazide (AVALIDE) 300-12.5 MG tablet   metFORMIN (GLUCOPHAGE) 1000 MG tablet      Meds ordered this encounter  Medications  . allopurinol (ZYLOPRIM) 300 MG tablet    Sig: Take 1 tablet (300 mg total) by mouth 2 (two) times daily.    Dispense:  180 tablet    Refill:  1  . atenolol (TENORMIN) 100 MG tablet    Sig: Take 1 tablet (100 mg total) by mouth daily.    Dispense:  90 tablet    Refill:  1  . Dulaglutide (TRULICITY) 1.5 SW/5.4OE SOPN    Sig: Inject 1.5 mg into the skin once a week.    Dispense:  2 mL    Refill:  5  . irbesartan-hydrochlorothiazide (AVALIDE) 300-12.5 MG tablet    Sig: Take 1 tablet by mouth daily.    Dispense:  90 tablet    Refill:  1  . levothyroxine (SYNTHROID) 112 MCG tablet    Sig: Take 1 tablet (112 mcg total) by mouth daily before breakfast.    Dispense:  90 tablet    Refill:  1  . metFORMIN (GLUCOPHAGE) 1000 MG tablet    Sig: Take 1 tablet (1,000 mg total) by mouth 2 (two) times daily with a meal.    Dispense:  180 tablet    Refill:  1    Follow-up: Return in about 3 months (around 06/23/2021) for Diabetes follow-up.    Beatrice Lecher, MD

## 2021-03-24 NOTE — Patient Instructions (Addendum)
Please see if your insurance will cover  Trelegy in place of the Breo and Spiriva Victoza or Ozempic in place of the [place] We can try going down on the allopurinol to 1 tab daily and then recheck your uric acid in 2 months.    I checked and the statin doesn't come in combo with betablocker but does come with a calcium channel blocker.

## 2021-03-24 NOTE — Assessment & Plan Note (Signed)
Follows with Allergy.  Recently started [name].  Could consider stopping Singulair if not helping.  Check with insurance to see if covers Trelegy in place of the [place] and Spiriva

## 2021-03-24 NOTE — Assessment & Plan Note (Signed)
A1C jumped up significantly we discussed changing the trulicity due to cost. She will check with insurance. Given list of other drugs.   Lab Results  Component Value Date   HGBA1C 8.7 (A) 03/24/2021

## 2021-03-24 NOTE — Assessment & Plan Note (Signed)
Last uric acid  6.5. discussed working on low purine diet so can possibly decrease allopurinol dose to once a day.  OK to just use Colcrys PRN instead of daily.    Lab Results  Component Value Date   LABURIC 6.5 03/22/2021

## 2021-03-24 NOTE — Assessment & Plan Note (Addendum)
History of MI. Continue BB and encouraged to restart Statin.

## 2021-03-24 NOTE — Assessment & Plan Note (Signed)
Thyroid Is OK. We do have some room for slight adjustment if needed.    Lab Results  Component Value Date   TSH 3.15 03/22/2021

## 2021-04-03 ENCOUNTER — Encounter: Payer: Self-pay | Admitting: Family Medicine

## 2021-04-03 ENCOUNTER — Other Ambulatory Visit: Payer: Self-pay

## 2021-04-03 ENCOUNTER — Ambulatory Visit (INDEPENDENT_AMBULATORY_CARE_PROVIDER_SITE_OTHER): Payer: PPO | Admitting: Family Medicine

## 2021-04-03 VITALS — BP 125/52 | HR 68 | Ht 64.0 in | Wt 194.0 lb

## 2021-04-03 DIAGNOSIS — I2510 Atherosclerotic heart disease of native coronary artery without angina pectoris: Secondary | ICD-10-CM | POA: Diagnosis not present

## 2021-04-03 DIAGNOSIS — N1831 Chronic kidney disease, stage 3a: Secondary | ICD-10-CM

## 2021-04-03 DIAGNOSIS — E1165 Type 2 diabetes mellitus with hyperglycemia: Secondary | ICD-10-CM | POA: Diagnosis not present

## 2021-04-03 DIAGNOSIS — R197 Diarrhea, unspecified: Secondary | ICD-10-CM | POA: Diagnosis not present

## 2021-04-03 DIAGNOSIS — E1122 Type 2 diabetes mellitus with diabetic chronic kidney disease: Secondary | ICD-10-CM

## 2021-04-03 DIAGNOSIS — L299 Pruritus, unspecified: Secondary | ICD-10-CM

## 2021-04-03 MED ORDER — NITROGLYCERIN 0.4 MG SL SUBL: 0.4000 mg | SUBLINGUAL_TABLET | SUBLINGUAL | 99 refills | Status: DC | PRN

## 2021-04-03 MED ORDER — OZEMPIC (0.25 OR 0.5 MG/DOSE) 2 MG/1.5ML ~~LOC~~ SOPN: PEN_INJECTOR | SUBCUTANEOUS | 3 refills | Status: DC

## 2021-04-03 MED ORDER — GLUCOSE BLOOD VI STRP: ORAL_STRIP | 11 refills | Status: AC

## 2021-04-03 NOTE — Assessment & Plan Note (Signed)
We will go ahead and switch to Ozempic hopefully it is a little bit better covered than the Trulicity with a lower co-pay.

## 2021-04-03 NOTE — Progress Notes (Signed)
Established Patient Office Visit  Subjective:  Patient ID: Tara Campbell, female    DOB: 06-13-1946  Age: 75 y.o. MRN: [id number]  CC:  Chief Complaint  Patient presents with  . Follow-up    HPI Tara Campbell presents for diarrhea and itching.  She says it would start about 7:04 sure she would take her allopurinol she gets intense itching in her palms and hands no actual rash or hives and then about 30 minutes later would get diarrhea.  She narrowed it down to the allopurinol and so has stopped that medication she says it is better since then but its not completely resolved she still getting a little bit of itching in her palms.  The only thing that we had really changed recently was upped her Trulicity but she never actually started the increased dose.  We also stopped her Singulair trying to simplify her regimen.  She was able to check on some of her medications and her insurance will cover Trelegy she has an upcoming appointment pulmonary and they could certainly look at switching her from the DuoNeb with Spiriva to Trelegy I think this would be easier for her and would simplify her routine.  Diabetes she also checked with her insurance and they will cover Ozempic and Victoza we will see if this is a little less expensive than the Trulicity so we will can send over new prescription explained that they are in the same class and works similarly but it could cause nausea and that there is not an exact conversion.  In fact I had just recently upped her Trulicity dose but she had not had a chance to increase it yet so she was still just taking 0.75 mg when her symptoms started.    Past Medical History:  Diagnosis Date  . Allergy   . Arthritis   . Asthma   . Colon polyps   . Diabetes mellitus without complication ([place])   . Gout   . Heart attack ([place]) 07/30/2016   PCI, 2 stents  . Hyperlipidemia   . Hypertension   . Internal hemorrhoids   . Left rotator cuff tear   . Mild stage  glaucoma 12/12/2017  . Otomycosis of right ear 09/27/2017  . Paroxysmal atrial fibrillation (HCC)   . Thyroid disease     Past Surgical History:  Procedure Laterality Date  . ANGIOPLASTY    . COLONOSCOPY W/ POLYPECTOMY  01/04/2015  . HEMORRHOID BANDING      Family History  Problem Relation Age of Onset  . Hypertension Mother   . Hyperlipidemia Mother   . Glaucoma Father   . Heart disease Father   . Hypertension Father   . Diabetes Father   . Hyperlipidemia Father   . Skin cancer Father   . Diabetes Paternal Grandmother   . Colon cancer Neg Hx   . Esophageal cancer Neg Hx     Social History   Socioeconomic History  . Marital status: Divorced    Spouse name: Not on file  . Number of children: 2  . Years of education: Not on file  . Highest education level: Not on file  Occupational History  . Not on file  Tobacco Use  . Smoking status: Never Smoker  . Smokeless tobacco: Never Used  Vaping Use  . Vaping Use: Never used  Substance and Sexual Activity  . Alcohol use: Yes    Comment: Rare  . Drug use: No  . Sexual activity: Not Currently  Other Topics Concern  . Not on file  Social History Narrative  . Not on file   Social Determinants of Health   Financial Resource Strain: Not on file  Food Insecurity: Not on file  Transportation Needs: Not on file  Physical Activity: Not on file  Stress: Not on file  Social Connections: Not on file  Intimate Partner Violence: Not on file    Outpatient Medications Prior to Visit  Medication Sig Dispense Refill  . albuterol (VENTOLIN HFA) 108 (90 Base) MCG/ACT inhaler Inhale 2 puffs into the lungs every 6 (six) hours as needed for wheezing. 18 g prn  . allopurinol (ZYLOPRIM) 300 MG tablet Take 1 tablet (300 mg total) by mouth 2 (two) times daily. 180 tablet 1  . apixaban (ELIQUIS) 5 MG TABS tablet Take 1 tablet (5 mg total) by mouth 2 (two) times daily. 180 tablet 1  . atenolol (TENORMIN) 100 MG tablet Take 1 tablet (100  mg total) by mouth daily. 90 tablet 1  . atorvastatin (LIPITOR) 80 MG tablet Take 1 tablet (80 mg total) by mouth at bedtime. 90 tablet 3  . blood glucose meter kit and supplies KIT Check morning fasting blood glucose daily and up to 4 times daily as directed 1 each 0  . Cyanocobalamin (B-12 PO) Take 1,000 mg by mouth daily.    Marland Kitchen EPINEPHrine 0.3 mg/0.3 mL IJ SOAJ injection Inject 0.3 mg into the muscle as needed for anaphylaxis. Call 9-1-1 after use. 1 each 2  . fluticasone (FLONASE) 50 MCG/ACT nasal spray Place 1 spray into both nostrils daily. 16 g 3  . fluticasone furoate-vilanterol (BREO ELLIPTA) 200-25 MCG/INH AEPB Inhale 1 puff into the lungs daily. Needs appt for further refills 60 each 6  . ipratropium-albuterol (DUONEB) 0.5-2.5 (3) MG/3ML SOLN Take 3 mLs by nebulization every 6 (six) hours as needed. 3 mL PRN  . irbesartan-hydrochlorothiazide (AVALIDE) 300-12.5 MG tablet Take 1 tablet by mouth daily. 90 tablet 1  . levalbuterol (XOPENEX HFA) 45 MCG/ACT inhaler Inhale 1-2 puffs into the lungs every 6 (six) hours as needed for wheezing or shortness of breath.    . levocetirizine (XYZAL ALLERGY 24HR) 5 MG tablet Take 1 tablet (5 mg total) by mouth every evening. 90 tablet 3  . levothyroxine (SYNTHROID) 112 MCG tablet Take 1 tablet (112 mcg total) by mouth daily before breakfast. 90 tablet 1  . Mepolizumab ([name]) 100 MG/ML SOAJ Inject 1 mL (100 mg total) into the skin every 28 (twenty-eight) days. 1 mL 5  . metFORMIN (GLUCOPHAGE) 1000 MG tablet Take 1 tablet (1,000 mg total) by mouth 2 (two) times daily with a meal. 180 tablet 1  . montelukast (SINGULAIR) 10 MG tablet Take 1 tablet (10 mg total) by mouth at bedtime. 90 tablet 3  . Omega-3 Fatty Acids (FISH OIL PO) Take 5,000 Units by mouth daily.    Marland Kitchen SPIRIVA RESPIMAT 2.5 MCG/ACT AERS Inhale 2 puffs into the lungs daily. 4 g 5  . COLCRYS 0.6 MG tablet TAKE 1 TABLET BY MOUTH  DAILY AS NEEDED 30 tablet 11  . Dulaglutide (TRULICITY) 1.5  ZO/1.0RU SOPN Inject 1.5 mg into the skin once a week. 2 mL 5  . glucose blood test strip To be used twice daily for testing blood sugars. E11.65 200 each 11  . nitroGLYCERIN (NITROSTAT) 0.4 MG SL tablet Place 1 tablet (0.4 mg total) under the tongue every 5 (five) minutes as needed for chest pain (or tightness). 30 tablet 0   No  facility-administered medications prior to visit.    Allergies  Allergen Reactions  . Latex   . Sulfa Antibiotics Rash    ROS Review of Systems    Objective:    Physical Exam Vitals reviewed.  Constitutional:      Appearance: She is well-developed.  HENT:     Head: Normocephalic and atraumatic.  Eyes:     Conjunctiva/sclera: Conjunctivae normal.  Cardiovascular:     Rate and Rhythm: Normal rate.  Pulmonary:     Effort: Pulmonary effort is normal.  Skin:    General: Skin is warm and dry.     Coloration: Skin is not pale.  Neurological:     Mental Status: She is alert and oriented to person, place, and time.  Psychiatric:        Behavior: Behavior normal.     BP (!) 125/52   Pulse 68   Ht _0  (1.626 m)   Wt 194 lb (88 kg)   SpO2 97%   BMI 33.30 kg/m  Wt Readings from Last 3 Encounters:  04/03/21 194 lb (88 kg)  03/24/21 193 lb (87.5 kg)  02/21/21 193 lb (87.5 kg)     Health Maintenance Due  Topic Date Due  . OPHTHALMOLOGY EXAM  12/19/2019  . COLONOSCOPY (Pts 45-65yr Insurance coverage will need to be confirmed)  02/01/2020    There are no preventive care reminders to display for this patient.  Lab Results  Component Value Date   TSH 3.15 03/22/2021   Lab Results  Component Value Date   WBC 6.8 03/22/2021   HGB 13.9 03/22/2021   HCT 41.3 03/22/2021   MCV 86.0 03/22/2021   PLT 313 03/22/2021   Lab Results  Component Value Date   NA 138 03/22/2021   K 4.5 03/22/2021   CO2 26 03/22/2021   GLUCOSE 187 (H) 03/22/2021   BUN 14 03/22/2021   CREATININE 0.85 03/22/2021   BILITOT 0.5 03/22/2021   ALKPHOS 100  05/29/2017   AST 13 03/22/2021   ALT 16 03/22/2021   PROT 7.0 03/22/2021   ALBUMIN 4.2 05/29/2017   CALCIUM 10.2 03/22/2021   ANIONGAP 12 03/07/2018   Lab Results  Component Value Date   CHOL 266 (H) 03/22/2021   Lab Results  Component Value Date   HDL 56 03/22/2021   Lab Results  Component Value Date   LDLCALC 165 (H) 03/22/2021   Lab Results  Component Value Date   TRIG 273 (H) 03/22/2021   Lab Results  Component Value Date   CHOLHDL 4.8 03/22/2021   Lab Results  Component Value Date   HGBA1C 8.7 (A) 03/24/2021      Assessment & Plan:   Problem List Items Addressed This Visit      Cardiovascular and Mediastinum   Coronary artery disease involving native coronary artery of native heart without angina pectoris   Relevant Medications   nitroGLYCERIN (NITROSTAT) 0.4 MG SL tablet     Endocrine   Type 2 diabetes mellitus with diabetic chronic kidney disease ([place]    We will go ahead and switch to Ozempic hopefully it is a little bit better covered than the Trulicity with a lower co-pay.      Relevant Medications   Semaglutide,0.25 or 0.5MG/DOS, (OZEMPIC, 0.25 OR 0.5 MG/DOSE,) 2 MG/1.5ML SOPN    Other Visit Diagnoses    Itching of both hands    -  Primary   Uncontrolled type 2 diabetes mellitus with hyperglycemia ([place]  Relevant Medications   glucose blood test strip   Semaglutide,0.25 or 0.5MG/DOS, (OZEMPIC, 0.25 OR 0.5 MG/DOSE,) 2 MG/1.5ML SOPN   Diarrhea, unspecified type          Itching hands-unclear etiology it does sound like she is having some type of reaction to something either food or medication.  She thinks the most likely culprit is the allopurinol and has felt somewhat better since stopping it but not completely I think it may be reasonable to hold the metformin for couple days just to see if that could even be contributing especially more to the diarrhea.  She did hold her Singulair so she could always restart it to see if that  helps.  Diarrhea - it seems to coincide timing wise with the itching hands I think it may be worth her holding her metformin for couple days to see if that could be contributing to her symptoms.  Meds ordered this encounter  Medications  . glucose blood test strip    Sig: To be used twice daily for testing blood sugars. E11.65    Dispense:  200 each    Refill:  11  . nitroGLYCERIN (NITROSTAT) 0.4 MG SL tablet    Sig: Place 1 tablet (0.4 mg total) under the tongue every 5 (five) minutes as needed for chest pain (or tightness).    Dispense:  30 tablet    Refill:  prn  . Semaglutide,0.25 or 0.5MG/DOS, (OZEMPIC, 0.25 OR 0.5 MG/DOSE,) 2 MG/1.5ML SOPN    Sig: Inject 0.25 mg into the skin once a week for 28 days, THEN 0.5 mg once a week for 28 days.    Dispense:  1.5 mL    Refill:  3    Follow-up: No follow-ups on file.    Beatrice Lecher, MD

## 2021-04-05 DIAGNOSIS — H18453 Nodular corneal degeneration, bilateral: Secondary | ICD-10-CM | POA: Diagnosis not present

## 2021-04-05 DIAGNOSIS — Z961 Presence of intraocular lens: Secondary | ICD-10-CM | POA: Diagnosis not present

## 2021-04-05 DIAGNOSIS — H02834 Dermatochalasis of left upper eyelid: Secondary | ICD-10-CM | POA: Diagnosis not present

## 2021-04-05 DIAGNOSIS — H179 Unspecified corneal scar and opacity: Secondary | ICD-10-CM | POA: Diagnosis not present

## 2021-04-05 DIAGNOSIS — H527 Unspecified disorder of refraction: Secondary | ICD-10-CM | POA: Diagnosis not present

## 2021-04-05 DIAGNOSIS — H43813 Vitreous degeneration, bilateral: Secondary | ICD-10-CM | POA: Diagnosis not present

## 2021-04-05 DIAGNOSIS — H02831 Dermatochalasis of right upper eyelid: Secondary | ICD-10-CM | POA: Diagnosis not present

## 2021-04-05 DIAGNOSIS — H40003 Preglaucoma, unspecified, bilateral: Secondary | ICD-10-CM | POA: Diagnosis not present

## 2021-04-05 DIAGNOSIS — E119 Type 2 diabetes mellitus without complications: Secondary | ICD-10-CM | POA: Diagnosis not present

## 2021-04-05 LAB — HM DIABETES EYE EXAM

## 2021-04-06 ENCOUNTER — Ambulatory Visit (INDEPENDENT_AMBULATORY_CARE_PROVIDER_SITE_OTHER): Payer: PPO | Admitting: Family Medicine

## 2021-04-06 VITALS — BP 133/66 | HR 78 | Resp 16 | Ht 64.0 in | Wt 195.0 lb

## 2021-04-06 DIAGNOSIS — Z0000 Encounter for general adult medical examination without abnormal findings: Secondary | ICD-10-CM

## 2021-04-06 DIAGNOSIS — Z1231 Encounter for screening mammogram for malignant neoplasm of breast: Secondary | ICD-10-CM

## 2021-04-06 NOTE — Patient Instructions (Addendum)
MEDICARE ANNUAL WELLNESS VISIT Health Maintenance Summary and Written Plan of Care  Tara Campbell ,  Thank you for allowing me to perform your Medicare Annual Wellness Visit and for your ongoing commitment to your health.   Health Maintenance & Immunization History Health Maintenance  Topic Date Due  . COLONOSCOPY (Pts 45-28yrs Insurance coverage will need to be confirmed)  04/06/2022 (Originally 02/01/2020)  . INFLUENZA VACCINE  07/03/2021  . HEMOGLOBIN A1C  09/23/2021  . FOOT EXAM  03/24/2022  . OPHTHALMOLOGY EXAM  04/05/2022  . MAMMOGRAM  06/08/2022  . TETANUS/TDAP  05/30/2027  . DEXA SCAN  Completed  . COVID-19 Vaccine  Completed  . Hepatitis C Screening  Completed  . PNA vac Low Risk Adult  Completed  . HPV VACCINES  Aged Out   Immunization History  Administered Date(s) Administered  . Fluad Quad(high Dose 65+) 09/22/2019  . Influenza, High Dose Seasonal PF 08/29/2017, 09/07/2018, 08/13/2020  . PFIZER(Purple Top)SARS-COV-2 Vaccination 12/13/2019, 01/04/2020, 09/03/2020, 03/24/2021  . Pneumococcal Conjugate-13 11/16/2019  . Pneumococcal Polysaccharide-23 08/29/2017  . Tdap 05/29/2017  . Zoster Recombinat (Shingrix) 02/28/2018, 06/12/2018    These are the patient goals that we discussed: Goals Addressed              This Visit's Progress   .  Patient Stated (pt-stated)        04/06/2021 AWV Goal: Diabetes Management  . Patient will maintain an A1C level below 8.0 . Patient will not develop any diabetic foot complications . Patient will not experience any hypoglycemic episodes over the next 3 months . Patient will notify our office of any CBG readings outside of the provider recommended range by calling [phone] . Patient will adhere to provider recommendations for diabetes management  Patient Self Management Activities . take all medications as prescribed and report any negative side effects . monitor and record blood sugar readings as directed . adhere to a  low carbohydrate diet that incorporates lean proteins, vegetables, whole grains, low glycemic fruits . check feet daily noting any sores, cracks, injuries, or callous formations . see PCP or podiatrist if she notices any changes in her legs, feet, or toenails . Patient will visit PCP and have an A1C level checked every 3 to 6 months as directed  . have a yearly eye exam to monitor for vascular changes associated with diabetes and will request that the report be sent to her pcp.  . consult with her PCP regarding any changes in her health or new or worsening symptoms         This is a list of Health Maintenance Items that are overdue or due now: There are no preventive care reminders to display for this patient.   Orders/Referrals Placed Today: Orders Placed This Encounter  Procedures  . Mammogram 3D SCREEN BREAST BILATERAL    Standing Status:   Future    Standing Expiration Date:   04/06/2022    Scheduling Instructions:     Patient is due after June 08, 2021. Please call to schedule.    Order Specific Question:   Reason for Exam (SYMPTOM  OR DIAGNOSIS REQUIRED)    Answer:   breast cancer screening    Order Specific Question:   Preferred imaging location?    Answer:   [name]   (Contact our referral department at [phone] if you have not spoken with someone about your referral appointment within the next 5 days)    Follow-up Plan . Follow-up with Agapito Games, MD  as planned . Schedule your colonoscopy. . Mammogram referral has been sent and they will call you to schedule.  . Medicare wellness visit in one year.   Diabetes Mellitus and Foot Care Foot care is an important part of your health, especially when you have diabetes. Diabetes may cause you to have problems because of poor blood flow (circulation) to your feet and legs, which can cause your skin to:  Become thinner and drier.  Break more easily.  Heal more slowly.  Peel and crack. You may  also have nerve damage (neuropathy) in your legs and feet, causing decreased feeling in them. This means that you may not notice minor injuries to your feet that could lead to more serious problems. Noticing and addressing any potential problems early is the best way to prevent future foot problems. How to care for your feet Foot hygiene  Wash your feet daily with warm water and mild soap. Do not use hot water. Then, pat your feet and the areas between your toes until they are completely dry. Do not soak your feet as this can dry your skin.  Trim your toenails straight across. Do not dig under them or around the cuticle. File the edges of your nails with an emery board or nail file.  Apply a moisturizing lotion or petroleum jelly to the skin on your feet and to dry, brittle toenails. Use lotion that does not contain alcohol and is unscented. Do not apply lotion between your toes.   Shoes and socks  Wear clean socks or stockings every day. Make sure they are not too tight. Do not wear knee-high stockings since they may decrease blood flow to your legs.  Wear shoes that fit properly and have enough cushioning. Always look in your shoes before you put them on to be sure there are no objects inside.  To break in new shoes, wear them for just a few hours a day. This prevents injuries on your feet. Wounds, scrapes, corns, and calluses  Check your feet daily for blisters, cuts, bruises, sores, and redness. If you cannot see the bottom of your feet, use a mirror or ask someone for help.  Do not cut corns or calluses or try to remove them with medicine.  If you find a minor scrape, cut, or break in the skin on your feet, keep it and the skin around it clean and dry. You may clean these areas with mild soap and water. Do not clean the area with peroxide, alcohol, or iodine.  If you have a wound, scrape, corn, or callus on your foot, look at it several times a day to make sure it is healing and not  infected. Check for: ? Redness, swelling, or pain. ? Fluid or blood. ? Warmth. ? Pus or a bad smell.   General tips  Do not cross your legs. This may decrease blood flow to your feet.  Do not use heating pads or hot water bottles on your feet. They may burn your skin. If you have lost feeling in your feet or legs, you may not know this is happening until it is too late.  Protect your feet from hot and cold by wearing shoes, such as at the beach or on hot pavement.  Schedule a complete foot exam at least once a year (annually) or more often if you have foot problems. Report any cuts, sores, or bruises to your health care provider immediately. Where to find more information  American Diabetes  Association: www.diabetes.org  Association of Diabetes Care & Education Specialists: www.diabeteseducator.org Contact a health care provider if:  You have a medical condition that increases your risk of infection and you have any cuts, sores, or bruises on your feet.  You have an injury that is not healing.  You have redness on your legs or feet.  You feel burning or tingling in your legs or feet.  You have pain or cramps in your legs and feet.  Your legs or feet are numb.  Your feet always feel cold.  You have pain around any toenails. Get help right away if:  You have a wound, scrape, corn, or callus on your foot and: ? You have pain, swelling, or redness that gets worse. ? You have fluid or blood coming from the wound, scrape, corn, or callus. ? Your wound, scrape, corn, or callus feels warm to the touch. ? You have pus or a bad smell coming from the wound, scrape, corn, or callus. ? You have a fever. ? You have a red line going up your leg. Summary  Check your feet every day for blisters, cuts, bruises, sores, and redness.  Apply a moisturizing lotion or petroleum jelly to the skin on your feet and to dry, brittle toenails.  Wear shoes that fit properly and have enough  cushioning.  If you have foot problems, report any cuts, sores, or bruises to your health care provider immediately.  Schedule a complete foot exam at least once a year (annually) or more often if you have foot problems. This information is not intended to replace advice given to you by your health care provider. Make sure you discuss any questions you have with your health care provider. Document Revised: 06/09/2020 Document Reviewed: 06/09/2020 Elsevier Patient Education  2021 [employer]   Health Maintenance, Female Adopting a healthy lifestyle and getting preventive care are important in promoting health and wellness. Ask your health care provider about:  The right schedule for you to have regular tests and exams.  Things you can do on your own to prevent diseases and keep yourself healthy. What should I know about diet, weight, and exercise? Eat a healthy diet  Eat a diet that includes plenty of vegetables, fruits, low-fat dairy products, and lean protein.  Do not eat a lot of foods that are high in solid fats, added sugars, or sodium.   Maintain a healthy weight Body mass index (BMI) is used to identify weight problems. It estimates body fat based on height and weight. Your health care provider can help determine your BMI and help you achieve or maintain a healthy weight. Get regular exercise Get regular exercise. This is one of the most important things you can do for your health. Most adults should:  Exercise for at least 150 minutes each week. The exercise should increase your heart rate and make you sweat (moderate-intensity exercise).  Do strengthening exercises at least twice a week. This is in addition to the moderate-intensity exercise.  Spend less time sitting. Even light physical activity can be beneficial. Watch cholesterol and blood lipids Have your blood tested for lipids and cholesterol at 77 years of age, then have this test every 5 years. Have your  cholesterol levels checked more often if:  Your lipid or cholesterol levels are high.  You are older than 77 years of age.  You are at high risk for heart disease. What should I know about cancer screening? Depending on your health history and family  history, you may need to have cancer screening at various ages. This may include screening for:  Breast cancer.  Cervical cancer.  Colorectal cancer.  Skin cancer.  Lung cancer. What should I know about heart disease, diabetes, and high blood pressure? Blood pressure and heart disease  High blood pressure causes heart disease and increases the risk of stroke. This is more likely to develop in people who have high blood pressure readings, are of African descent, or are overweight.  Have your blood pressure checked: ? Every 3-5 years if you are 8718-77 years of age. ? Every year if you are 77 years old or older. Diabetes Have regular diabetes screenings. This checks your fasting blood sugar level. Have the screening done:  Once every three years after age 77 if you are at a normal weight and have a low risk for diabetes.  More often and at a younger age if you are overweight or have a high risk for diabetes. What should I know about preventing infection? Hepatitis B If you have a higher risk for hepatitis B, you should be screened for this virus. Talk with your health care provider to find out if you are at risk for hepatitis B infection. Hepatitis C Testing is recommended for:  Everyone born from [birth year] through 1967.  Anyone with known risk factors for hepatitis C. Sexually transmitted infections (STIs)  Get screened for STIs, including gonorrhea and chlamydia, if: ? You are sexually active and are younger than 77 years of age. ? You are older than 77 years of age and your health care provider tells you that you are at risk for this type of infection. ? Your sexual activity has changed since you were last screened, and you are  at increased risk for chlamydia or gonorrhea. Ask your health care provider if you are at risk.  Ask your health care provider about whether you are at high risk for HIV. Your health care provider may recommend a prescription medicine to help prevent HIV infection. If you choose to take medicine to prevent HIV, you should first get tested for HIV. You should then be tested every 3 months for as long as you are taking the medicine. Pregnancy  If you are about to stop having your period (premenopausal) and you may become pregnant, seek counseling before you get pregnant.  Take 400 to 800 micrograms (mcg) of folic acid every day if you become pregnant.  Ask for birth control (contraception) if you want to prevent pregnancy. Osteoporosis and menopause Osteoporosis is a disease in which the bones lose minerals and strength with aging. This can result in bone fractures. If you are 77 years old or older, or if you are at risk for osteoporosis and fractures, ask your health care provider if you should:  Be screened for bone loss.  Take a calcium or vitamin D supplement to lower your risk of fractures.  Be given hormone replacement therapy (HRT) to treat symptoms of menopause. Follow these instructions at home: Lifestyle  Do not use any products that contain nicotine or tobacco, such as cigarettes, e-cigarettes, and chewing tobacco. If you need help quitting, ask your health care provider.  Do not use street drugs.  Do not share needles.  Ask your health care provider for help if you need support or information about quitting drugs. Alcohol use  Do not drink alcohol if: ? Your health care provider tells you not to drink. ? You are pregnant, may be pregnant, or are  planning to become pregnant.  If you drink alcohol: ? Limit how much you use to 0-1 drink a day. ? Limit intake if you are breastfeeding.  Be aware of how much alcohol is in your drink. In the [place] one drink equals one 12 oz  bottle of beer (355 mL), one 5 oz glass of wine (148 mL), or one 1 oz glass of hard liquor (44 mL). General instructions  Schedule regular health, dental, and eye exams.  Stay current with your vaccines.  Tell your health care provider if: ? You often feel depressed. ? You have ever been abused or do not feel safe at home. Summary  Adopting a healthy lifestyle and getting preventive care are important in promoting health and wellness.  Follow your health care provider's instructions about healthy diet, exercising, and getting tested or screened for diseases.  Follow your health care provider's instructions on monitoring your cholesterol and blood pressure. This information is not intended to replace advice given to you by your health care provider. Make sure you discuss any questions you have with your health care provider. Document Revised: 11/12/2018 Document Reviewed: 11/12/2018 Elsevier Patient Education  2021 [employer].

## 2021-04-06 NOTE — Progress Notes (Signed)
MEDICARE ANNUAL WELLNESS VISIT  04/06/2021  Subjective:  Tara Campbell is a 75 y.o. female patient of Metheney, Rene Kocher, MD who had a Medicare Annual Wellness Visit today. Tara Campbell is Retired and lives alone. Tara Campbell has 2 children. Tara Campbell reports that Tara Campbell is socially active and does interact with friends/family regularly. Tara Campbell is minimally physically active and enjoys painting and crafts.  Patient Care Team: Hali Marry, MD as PCP - General (Family Medicine) Juanito Doom, MD as Consulting Physician (Pulmonary Disease) Lelon Perla, MD as Consulting Physician (Cardiology)  Advanced Directives 04/06/2021 08/22/2019 07/29/2018 03/07/2018 01/28/2018 01/14/2017  Does Patient Have a Medical Advance Directive? Yes No Yes No Yes Yes  Type of Paramedic of [place];Living will - [place];Living will - - [place];Living will;Out of facility DNR (pink MOST or yellow form)  Does patient want to make changes to medical advance directive? No - Patient declined - - - No - Patient declined -  Copy of [place] in Chart? No - copy requested - - - - -  Would patient like information on creating a medical advance directive? - - - No - Patient declined - [facility] Utilization Over the Past 12 Months: # of hospitalizations or ER visits: 1 # of surgeries: 0  Review of Systems    Patient reports that her overall health is better when compared to last year.  Review of Systems: History obtained from chart review and the patient  All other systems negative.  Pain Assessment Pain : No/denies pain     Current Medications & Allergies (verified) Allergies as of 04/06/2021      Reactions   Latex    Sulfa Antibiotics Rash      Medication List       Accurate as of Apr 06, 2021  4:18 PM. If you have any questions, ask your nurse or doctor.        albuterol 108 (90 Base) MCG/ACT inhaler Commonly known as:  VENTOLIN HFA Inhale 2 puffs into the lungs every 6 (six) hours as needed for wheezing.   allopurinol 300 MG tablet Commonly known as: ZYLOPRIM Take 1 tablet (300 mg total) by mouth 2 (two) times daily.   apixaban 5 MG Tabs tablet Commonly known as: ELIQUIS Take 1 tablet (5 mg total) by mouth 2 (two) times daily.   atenolol 100 MG tablet Commonly known as: TENORMIN Take 1 tablet (100 mg total) by mouth daily.   atorvastatin 80 MG tablet Commonly known as: LIPITOR Take 1 tablet (80 mg total) by mouth at bedtime.   B-12 PO Take 1,000 mg by mouth daily.   blood glucose meter kit and supplies Kit Check morning fasting blood glucose daily and up to 4 times daily as directed   Breo Ellipta 200-25 MCG/INH Aepb Generic drug: fluticasone furoate-vilanterol Inhale 1 puff into the lungs daily. Needs appt for further refills   EPINEPHrine 0.3 mg/0.3 mL Soaj injection Commonly known as: EPI-PEN Inject 0.3 mg into the muscle as needed for anaphylaxis. Call 9-1-1 after use.   FISH OIL PO Take 5,000 Units by mouth daily.   fluticasone 50 MCG/ACT nasal spray Commonly known as: FLONASE Place 1 spray into both nostrils daily.   glucose blood test strip To be used twice daily for testing blood sugars. E11.65   ipratropium-albuterol 0.5-2.5 (3) MG/3ML Soln Commonly known as: DUONEB Take 3 mLs by nebulization every 6 (six) hours as  needed.   irbesartan-hydrochlorothiazide 300-12.5 MG tablet Commonly known as: AVALIDE Take 1 tablet by mouth daily.   levalbuterol 45 MCG/ACT inhaler Commonly known as: XOPENEX HFA Inhale 1-2 puffs into the lungs every 6 (six) hours as needed for wheezing or shortness of breath.   levocetirizine 5 MG tablet Commonly known as: Xyzal Allergy 24HR Take 1 tablet (5 mg total) by mouth every evening.   levothyroxine 112 MCG tablet Commonly known as: SYNTHROID Take 1 tablet (112 mcg total) by mouth daily before breakfast.   metFORMIN 1000 MG  tablet Commonly known as: GLUCOPHAGE Take 1 tablet (1,000 mg total) by mouth 2 (two) times daily with a meal.   montelukast 10 MG tablet Commonly known as: SINGULAIR Take 1 tablet (10 mg total) by mouth at bedtime.   nitroGLYCERIN 0.4 MG SL tablet Commonly known as: Nitrostat Place 1 tablet (0.4 mg total) under the tongue every 5 (five) minutes as needed for chest pain (or tightness).   [name] 100 MG/ML Soaj Generic drug: Mepolizumab Inject 1 mL (100 mg total) into the skin every 28 (twenty-eight) days.   Ozempic (0.25 or 0.5 MG/DOSE) 2 MG/1.5ML Sopn Generic drug: Semaglutide(0.25 or 0.5MG/DOS) Inject 0.25 mg into the skin once a week for 28 days, THEN 0.5 mg once a week for 28 days. Start taking on: Apr 03, 2021   Spiriva Respimat 2.5 MCG/ACT Aers Generic drug: Tiotropium Bromide Monohydrate Inhale 2 puffs into the lungs daily.       History (reviewed): Past Medical History:  Diagnosis Date  . Allergy   . Arthritis   . Asthma   . Colon polyps   . Diabetes mellitus without complication ([place])   . Gout   . Heart attack ([place]) 07/30/2016   PCI, 2 stents  . Hyperlipidemia   . Hypertension   . Internal hemorrhoids   . Left rotator cuff tear   . 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 Otomycosis of right ear 09/27/2017  . Paroxysmal atrial fibrillation ([facility])   . Thyroid disease    Past Surgical History:  Procedure Laterality Date  . ANGIOPLASTY    . COLONOSCOPY W/ POLYPECTOMY  01/04/2015  . HEMORRHOID BANDING     Family History  Problem Relation Age of Onset  . Hypertension Mother   . Hyperlipidemia Mother   . Glaucoma Father   . Heart disease Father   . Hypertension Father   . Diabetes Father   . Hyperlipidemia Father   . Skin cancer Father   . Diabetes Paternal Grandmother   . Colon cancer Neg Hx   . Esophageal cancer Neg Hx    Social History   Socioeconomic History  . Marital status: Divorced    Spouse name: Not on file  . Number of children: 2  . Years  of education: 13.5  . Highest education level: 12th grade  Occupational History    Comment: retired  Tobacco Use  . Smoking status: Never Smoker  . Smokeless tobacco: Never Used  Vaping Use  . Vaping Use: Never used  Substance and Sexual Activity  . Alcohol use: Yes    Comment: Rare  . Drug use: No  . Sexual activity: Not Currently  Other Topics Concern  . Not on file  Social History Narrative   Lives alone. Likes to crafts and paint in her free time.   Social Determinants of Health   Financial Resource Strain: Low Risk   . Difficulty of Paying Living Expenses: Not hard at all  Food Insecurity:  No Food Insecurity  . Worried About Charity fundraiser in the Last Year: Never true  . Ran Out of Food in the Last Year: Never true  Transportation Needs: No Transportation Needs  . Lack of Transportation (Medical): No  . Lack of Transportation (Non-Medical): No  Physical Activity: Inactive  . Days of Exercise per Week: 0 days  . Minutes of Exercise per Session: 0 min  Stress: No Stress Concern Present  . Feeling of Stress : Not at all  Social Connections: Moderately Isolated  . Frequency of Communication with Friends and Family: More than three times a week  . Frequency of Social Gatherings with Friends and Family: More than three times a week  . Attends Religious Services: Never  . Active Member of Clubs or Organizations: Yes  . Attends Archivist Meetings: More than 4 times per year  . Marital Status: Divorced    Activities of Daily Living In your present state of health, do you have any difficulty performing the following activities: 04/06/2021  Hearing? N  Vision? N  Difficulty concentrating or making decisions? N  Walking or climbing stairs? Y  Comment Due to a bad hip and knee  Dressing or bathing? N  Doing errands, shopping? N  Preparing Food and eating ? N  Using the Toilet? N  In the past six months, have you accidently leaked urine? N  Do you have  problems with loss of bowel control? N  Managing your Medications? N  Managing your Finances? N  Housekeeping or managing your Housekeeping? N  Some recent data might be hidden    Patient Education/Literacy How often do you need to have someone help you when you read instructions, pamphlets, or other written materials from your doctor or pharmacy?: 1 - Never What is the last grade level you completed in school?: Some college  Exercise Current Exercise Habits: The patient does not participate in regular exercise at present, Exercise limited by: respiratory conditions(s) (asthma)  Diet Patient reports consuming 2-3 meals a day and 0 snack(s) a day Patient reports that her primary diet is: Regular Patient reports that Tara Campbell does have regular access to food.   Depression Screen PHQ 2/9 Scores 04/06/2021 03/24/2021 10/05/2019 01/28/2018 05/29/2017  PHQ - 2 Score 0 0 0 0 0     Fall Risk Fall Risk  04/06/2021 03/24/2021 10/05/2019 04/15/2019 01/28/2018  Falls in the past year? 0 0 0 0 No  Number falls in past yr: 0 - 0 - -  Injury with Fall? 0 - 0 - -  Risk for fall due to : No Fall Risks No Fall Risks - - -  Follow up Falls evaluation completed Falls evaluation completed - - -     Objective:   BP 133/66 (BP Location: Right Arm, Patient Position: Sitting, Cuff Size: Normal)   Pulse 78   Resp 16   Ht '5\' 4"'  (1.626 m)   Wt 195 lb 0.6 oz (88.5 kg)   SpO2 97%   BMI 33.48 kg/m   Last Weight  Most recent update: 04/06/2021  3:53 PM   Weight  88.5 kg (195 lb 0.6 oz)            Body mass index is 33.48 kg/m.  Hearing/Vision  . Tara Campbell did not have difficulty with hearing/understanding during the face-to-face interview . Tara Campbell did not have difficulty with her vision during the face-to-face interview . Reports that Tara Campbell has had a formal eye exam by an eye  care professional within the past year . Reports that Tara Campbell has not had a formal hearing evaluation within the past year  Cognitive  Function: 6CIT Screen 04/06/2021  What Year? 0 points  What month? 0 points  What time? 0 points  Count back from 20 0 points  Months in reverse 0 points  Repeat phrase 0 points  Total Score 0    Normal Cognitive Function Screening: Yes (Normal:0-7, Significant for Dysfunction: >8)  Immunization & Health Maintenance Record Immunization History  Administered Date(s) Administered  . Fluad Quad(high Dose 65+) 09/22/2019  . Influenza, High Dose Seasonal PF 08/29/2017, 09/07/2018, 08/13/2020  . PFIZER(Purple Top)SARS-COV-2 Vaccination 12/13/2019, 01/04/2020, 09/03/2020, 03/24/2021  . Pneumococcal Conjugate-13 11/16/2019  . Pneumococcal Polysaccharide-23 08/29/2017  . Tdap 05/29/2017  . Zoster Recombinat (Shingrix) 02/28/2018, 06/12/2018    Health Maintenance  Topic Date Due  . COLONOSCOPY (Pts 45-68yr Insurance coverage will need to be confirmed)  04/06/2022 (Originally 02/01/2020)  . INFLUENZA VACCINE  07/03/2021  . HEMOGLOBIN A1C  09/23/2021  . FOOT EXAM  03/24/2022  . OPHTHALMOLOGY EXAM  04/05/2022  . MAMMOGRAM  06/08/2022  . TETANUS/TDAP  05/30/2027  . DEXA SCAN  Completed  . COVID-19 Vaccine  Completed  . Hepatitis C Screening  Completed  . PNA vac Low Risk Adult  Completed  . HPV VACCINES  Aged Out       Assessment  This is a routine wellness examination for Tara Campbell  Health Maintenance: Due or Overdue There are no preventive care reminders to display for this patient.  Tara Campbell not need a referral for Community Assistance: Care Management:   no Social Work:    no Prescription Assistance:  no Nutrition/Diabetes Education:  no   Plan:  Personalized Goals Goals Addressed              This Visit's Progress   .  Patient Stated (pt-stated)        04/06/2021 AWV Goal: Diabetes Management  . Patient will maintain an A1C level below 8.0 . Patient will not develop any diabetic foot complications . Patient will not experience any  hypoglycemic episodes over the next 3 months . Patient will notify our office of any CBG readings outside of the provider recommended range by calling [phone]. Patient will adhere to provider recommendations for diabetes management  Patient Self Management Activities . take all medications as prescribed and report any negative side effects . monitor and record blood sugar readings as directed . adhere to a low carbohydrate diet that incorporates lean proteins, vegetables, whole grains, low glycemic fruits . check feet daily noting any sores, cracks, injuries, or callous formations . see PCP or podiatrist if Tara Campbell notices any changes in her legs, feet, or toenails . Patient will visit PCP and have an A1C level checked every 3 to 6 months as directed  . have a yearly eye exam to monitor for vascular changes associated with diabetes and will request that the report be sent to her pcp.  . consult with her PCP regarding any changes in her health or new or worsening symptoms       Personalized Health Maintenance & Screening Recommendations  Screening mammography Colorectal cancer screening - Patient will call and schedule her colonoscopy.  Lung Cancer Screening Recommended: no (Low Dose CT Chest recommended if Age 77-80years, 30 pack-year currently smoking OR have quit w/in past 15 years) Hepatitis C Screening recommended: no HIV Screening recommended: no  Advanced Directives: Written information  was not given per the patient's request.  Referrals & Orders Orders Placed This Encounter  Procedures  . Mammogram 3D SCREEN BREAST BILATERAL    Follow-up Plan . Follow-up with Hali Marry, MD as planned . Schedule your colonoscopy. . Mammogram referral has been sent and they will call you to schedule.  . Medicare wellness visit in one year.   I have personally reviewed and noted the following in the patient's chart:   . Medical and social history . Use of alcohol, tobacco  or illicit drugs  . Current medications and supplements . Functional ability and status . Nutritional status . Physical activity . Advanced directives . List of other physicians . Hospitalizations, surgeries, and ER visits in previous 12 months . Vitals . Screenings to include cognitive, depression, and falls . Referrals and appointments  In addition, I have reviewed and discussed with patient certain preventive protocols, quality metrics, and best practice recommendations. A written personalized care plan for preventive services as well as general preventive health recommendations were provided to patient.     Tara Gens, RN  04/06/2021

## 2021-04-24 ENCOUNTER — Telehealth: Payer: Self-pay | Admitting: *Deleted

## 2021-04-24 ENCOUNTER — Ambulatory Visit (INDEPENDENT_AMBULATORY_CARE_PROVIDER_SITE_OTHER): Payer: PPO | Admitting: Family Medicine

## 2021-04-24 ENCOUNTER — Telehealth: Payer: Self-pay | Admitting: Cardiology

## 2021-04-24 ENCOUNTER — Other Ambulatory Visit: Payer: Self-pay

## 2021-04-24 ENCOUNTER — Encounter: Payer: Self-pay | Admitting: Family Medicine

## 2021-04-24 VITALS — BP 110/55 | HR 80 | Ht 64.0 in | Wt 195.0 lb

## 2021-04-24 DIAGNOSIS — R0789 Other chest pain: Secondary | ICD-10-CM

## 2021-04-24 DIAGNOSIS — E1165 Type 2 diabetes mellitus with hyperglycemia: Secondary | ICD-10-CM

## 2021-04-24 DIAGNOSIS — R1310 Dysphagia, unspecified: Secondary | ICD-10-CM

## 2021-04-24 MED ORDER — METFORMIN HCL 500 MG PO TABS: 1000.0000 mg | ORAL_TABLET | Freq: Two times a day (BID) | ORAL | 1 refills | Status: DC

## 2021-04-24 NOTE — Telephone Encounter (Signed)
Spoke with pt, yesterday she developed a feeling of something stuck in her throat after eating and also had a burning type pain in her chest. This has occurred a couple times after eating but does not occur every time she eats. She did get nauseated yesterday and ended up vomiting and felt better. She is unable to tell if she is in atrial fib or not, her bp has been fine but she did not notice her pulse. She is worried because she is going out of town the end of the week and has decided she is going to call her medical doctor to see if they can do an EKG. She will let us know if the PCP feels she needs to be seen.

## 2021-04-24 NOTE — Telephone Encounter (Signed)
Pt reports having chest pressure especially after she eats breakfast. She contacted the cardiologist and was that it could be A-fib   appt made for 240pm today

## 2021-04-24 NOTE — Progress Notes (Signed)
atypi   Acute Office Visit  Subjective:    Patient ID: Tara Campbell, female    DOB: August 14, 1946, 75 y.o.   MRN: [id number]  Chief Complaint  Patient presents with  . Chest Pain    HPI Patient is in today for chest pain over the lower chest. Started 3 days ago with chest pain after eating over the lower sternum.   She says each time that it happened starting Saturday morning it seemed to happen after breakfast after eating Cheerios.  She says normally she eats an egg but decided to each areas which she had not had in quite some time with a little bit of milk which she does not normally drink.  She said she would have midsternal chest pain for about 30 to 45 minutes and then it would ease up.  Does not occur with activity.  And it has not occurred when eating other foods she denies any recent changes in her diet such as increases in caffeine or spicy foods or greasy foods.  She says she has maybe 1 caffeinated drink every few days.  She denies any excess belching or nausea.  She did vomit on Saturday after the pain started but has not vomited since then.  Also reports that she is noticing things like her metformin pills which are fairly large getting stuck in that mid chest area as well that has been going on for quite some time.  Past Medical History:  Diagnosis Date  . Allergy   . Arthritis   . Asthma   . Colon polyps   . Diabetes mellitus without complication ([place])   . Gout   . Heart attack ([place]) 07/30/2016   PCI, 2 stents  . Hyperlipidemia   . Hypertension   . Internal hemorrhoids   . Left rotator cuff tear   . 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 Otomycosis of right ear 09/27/2017  . Paroxysmal atrial fibrillation ([facility])   . Thyroid disease     Past Surgical History:  Procedure Laterality Date  . ANGIOPLASTY    . COLONOSCOPY W/ POLYPECTOMY  01/04/2015  . HEMORRHOID BANDING      Family History  Problem Relation Age of Onset  . Hypertension Mother   . Hyperlipidemia  Mother   . Glaucoma Father   . Heart disease Father   . Hypertension Father   . Diabetes Father   . Hyperlipidemia Father   . Skin cancer Father   . Diabetes Paternal Grandmother   . Colon cancer Neg Hx   . Esophageal cancer Neg Hx     Social History   Socioeconomic History  . Marital status: Divorced    Spouse name: Not on file  . Number of children: 2  . Years of education: 13.5  . Highest education level: 12th grade  Occupational History    Comment: retired  Tobacco Use  . Smoking status: Never Smoker  . Smokeless tobacco: Never Used  Vaping Use  . Vaping Use: Never used  Substance and Sexual Activity  . Alcohol use: Yes    Comment: Rare  . Drug use: No  . Sexual activity: Not Currently  Other Topics Concern  . Not on file  Social History Narrative   Lives alone. Likes to crafts and paint in her free time.   Social Determinants of Health   Financial Resource Strain: Low Risk   . Difficulty of Paying Living Expenses: Not hard at all  Food Insecurity: No Food Insecurity  .  Worried About Charity fundraiser in the Last Year: Never true  . Ran Out of Food in the Last Year: Never true  Transportation Needs: No Transportation Needs  . Lack of Transportation (Medical): No  . Lack of Transportation (Non-Medical): No  Physical Activity: Inactive  . Days of Exercise per Week: 0 days  . Minutes of Exercise per Session: 0 min  Stress: No Stress Concern Present  . Feeling of Stress : Not at all  Social Connections: Moderately Isolated  . Frequency of Communication with Friends and Family: More than three times a week  . Frequency of Social Gatherings with Friends and Family: More than three times a week  . Attends Religious Services: Never  . Active Member of Clubs or Organizations: Yes  . Attends Archivist Meetings: More than 4 times per year  . Marital Status: Divorced  Human resources officer Violence: Not At Risk  . Fear of Current or Ex-Partner: No  .  Emotionally Abused: No  . Physically Abused: No  . Sexually Abused: No    Outpatient Medications Prior to Visit  Medication Sig Dispense Refill  . albuterol (VENTOLIN HFA) 108 (90 Base) MCG/ACT inhaler Inhale 2 puffs into the lungs every 6 (six) hours as needed for wheezing. 18 g prn  . allopurinol (ZYLOPRIM) 300 MG tablet Take 1 tablet (300 mg total) by mouth 2 (two) times daily. 180 tablet 1  . apixaban (ELIQUIS) 5 MG TABS tablet Take 1 tablet (5 mg total) by mouth 2 (two) times daily. 180 tablet 1  . atenolol (TENORMIN) 100 MG tablet Take 1 tablet (100 mg total) by mouth daily. 90 tablet 1  . atorvastatin (LIPITOR) 80 MG tablet Take 1 tablet (80 mg total) by mouth at bedtime. 90 tablet 3  . blood glucose meter kit and supplies KIT Check morning fasting blood glucose daily and up to 4 times daily as directed 1 each 0  . Cyanocobalamin (B-12 PO) Take 1,000 mg by mouth daily.    Marland Kitchen EPINEPHrine 0.3 mg/0.3 mL IJ SOAJ injection Inject 0.3 mg into the muscle as needed for anaphylaxis. Call 9-1-1 after use. 1 each 2  . fluticasone (FLONASE) 50 MCG/ACT nasal spray Place 1 spray into both nostrils daily. 16 g 3  . fluticasone furoate-vilanterol (BREO ELLIPTA) 200-25 MCG/INH AEPB Inhale 1 puff into the lungs daily. Needs appt for further refills 60 each 6  . glucose blood test strip To be used twice daily for testing blood sugars. E11.65 200 each 11  . ipratropium-albuterol (DUONEB) 0.5-2.5 (3) MG/3ML SOLN Take 3 mLs by nebulization every 6 (six) hours as needed. 3 mL PRN  . irbesartan-hydrochlorothiazide (AVALIDE) 300-12.5 MG tablet Take 1 tablet by mouth daily. 90 tablet 1  . levalbuterol (XOPENEX HFA) 45 MCG/ACT inhaler Inhale 1-2 puffs into the lungs every 6 (six) hours as needed for wheezing or shortness of breath.    . levocetirizine (XYZAL ALLERGY 24HR) 5 MG tablet Take 1 tablet (5 mg total) by mouth every evening. 90 tablet 3  . levothyroxine (SYNTHROID) 112 MCG tablet Take 1 tablet (112 mcg  total) by mouth daily before breakfast. 90 tablet 1  . Mepolizumab ([name]) 100 MG/ML SOAJ Inject 1 mL (100 mg total) into the skin every 28 (twenty-eight) days. 1 mL 5  . montelukast (SINGULAIR) 10 MG tablet Take 1 tablet (10 mg total) by mouth at bedtime. 90 tablet 3  . nitroGLYCERIN (NITROSTAT) 0.4 MG SL tablet Place 1 tablet (0.4 mg total) under the  tongue every 5 (five) minutes as needed for chest pain (or tightness). 30 tablet prn  . Omega-3 Fatty Acids (FISH OIL PO) Take 5,000 Units by mouth daily.    . Semaglutide,0.25 or 0.5MG/DOS, (OZEMPIC, 0.25 OR 0.5 MG/DOSE,) 2 MG/1.5ML SOPN Inject 0.25 mg into the skin once a week for 28 days, THEN 0.5 mg once a week for 28 days. 1.5 mL 3  . SPIRIVA RESPIMAT 2.5 MCG/ACT AERS Inhale 2 puffs into the lungs daily. 4 g 5  . metFORMIN (GLUCOPHAGE) 1000 MG tablet Take 1 tablet (1,000 mg total) by mouth 2 (two) times daily with a meal. 180 tablet 1   No facility-administered medications prior to visit.    Allergies  Allergen Reactions  . Latex   . Sulfa Antibiotics Rash    Review of Systems     Objective:    Physical Exam Constitutional:      Appearance: She is well-developed.  HENT:     Head: Normocephalic and atraumatic.  Cardiovascular:     Rate and Rhythm: Normal rate and regular rhythm.     Heart sounds: Normal heart sounds.  Pulmonary:     Effort: Pulmonary effort is normal.     Breath sounds: Normal breath sounds.  Skin:    General: Skin is warm and dry.  Neurological:     Mental Status: She is alert and oriented to person, place, and time.  Psychiatric:        Behavior: Behavior normal.     BP (!) 110/55   Pulse 80   Ht '5\' 4"'  (1.626 m)   Wt 195 lb (88.5 kg)   SpO2 98%   BMI 33.47 kg/m  Wt Readings from Last 3 Encounters:  04/24/21 195 lb (88.5 kg)  04/06/21 195 lb 0.6 oz (88.5 kg)  04/03/21 194 lb (88 kg)    There are no preventive care reminders to display for this patient.  There are no preventive care  reminders to display for this patient.   Lab Results  Component Value Date   TSH 2.65 04/24/2021   Lab Results  Component Value Date   WBC 10.3 04/24/2021   HGB 13.2 04/24/2021   HCT 39.9 04/24/2021   MCV 87.9 04/24/2021   PLT 355 04/24/2021   Lab Results  Component Value Date   NA 138 04/24/2021   K 4.3 04/24/2021   CO2 29 04/24/2021   GLUCOSE 188 (H) 04/24/2021   BUN 17 04/24/2021   CREATININE 1.15 (H) 04/24/2021   BILITOT 0.4 04/24/2021   ALKPHOS 100 05/29/2017   AST 19 04/24/2021   ALT 17 04/24/2021   PROT 7.1 04/24/2021   ALBUMIN 4.2 05/29/2017   CALCIUM 10.9 (H) 04/24/2021   ANIONGAP 12 03/07/2018   Lab Results  Component Value Date   CHOL 266 (H) 03/22/2021   Lab Results  Component Value Date   HDL 56 03/22/2021   Lab Results  Component Value Date   LDLCALC 165 (H) 03/22/2021   Lab Results  Component Value Date   TRIG 273 (H) 03/22/2021   Lab Results  Component Value Date   CHOLHDL 4.8 03/22/2021   Lab Results  Component Value Date   HGBA1C 8.7 (A) 03/24/2021       Assessment & Plan:   Problem List Items Addressed This Visit   None   Visit Diagnoses    Atypical chest pain    -  Primary   Relevant Orders   CBC (Completed)   COMPLETE METABOLIC PANEL  WITH GFR (Completed)   TSH (Completed)   Troponin I   Uncontrolled type 2 diabetes mellitus with hyperglycemia (HCC)       Relevant Medications   metFORMIN (GLUCOPHAGE) 500 MG tablet   Dysphagia, unspecified type       Relevant Orders   Ambulatory referral to Gastroenterology     Atypical chest pain-I do not think this is cardiac related.  It seems to be very much associated with when she has had periods the last 3 mornings and then it seems to resolve afterwards.  But we did go ahead and do an EKG today she does have some change in her T waves in her lateral leads and V4 through V6 so I do want to get her in with cardiology sooner rather than her appointment in September.  She is also  been experiencing some dysphagia even before this happened.  So also 1 refer her to GI for that she says she is due for her repeat colonoscopy anyway.  Suspect that she may have some element of esophageal stricture.  I really do not think this is GERD related.  But we can certainly consider a trial of a PPI if symptoms persist.  Tomorrow morning encouraged her to go back to her usual breakfast of an egg and see if her symptoms occur she will give me a call tomorrow and let me know in the meantime I did do some additional lab work.  Dysphagia-we will switch the metformin to 500 mg so she can have a little easier time swallowing it for now.   Meds ordered this encounter  Medications  . metFORMIN (GLUCOPHAGE) 500 MG tablet    Sig: Take 2 tablets (1,000 mg total) by mouth 2 (two) times daily with a meal.    Dispense:  360 tablet    Refill:  1    Needs the lower dose due to swallowing issues.     Beatrice Lecher, MD

## 2021-04-24 NOTE — Telephone Encounter (Signed)
Patient c/o Palpitations:  High priority if patient c/o lightheadedness, shortness of breath, or chest pain  1) How long have you had palpitations/irregular HR/ Afib? Are you having the symptoms now? For about  2) Are you currently experience daysing lightheadedness, SOB or CP? No   3) Do you have a history of afib (atrial fibrillation) or irregular heart rhythm? AFIB in the past  4) Have you checked your BP or HR? (document readings if available): BP 130/74   5) Are you experiencing any other symptoms? Yesterday vomiting and lighthead

## 2021-04-25 ENCOUNTER — Inpatient Hospital Stay (HOSPITAL_COMMUNITY)
Admission: EM | Admit: 2021-04-25 | Discharge: 2021-04-27 | DRG: 282 | Disposition: A | Discharge status: Home / Self Care | Payer: PPO | Attending: Cardiology | Admitting: Cardiology

## 2021-04-25 ENCOUNTER — Encounter: Payer: Self-pay | Admitting: Family Medicine

## 2021-04-25 ENCOUNTER — Encounter (HOSPITAL_COMMUNITY): Payer: Self-pay | Admitting: Emergency Medicine

## 2021-04-25 ENCOUNTER — Telehealth: Payer: Self-pay | Admitting: *Deleted

## 2021-04-25 ENCOUNTER — Other Ambulatory Visit: Payer: Self-pay

## 2021-04-25 ENCOUNTER — Emergency Department (HOSPITAL_COMMUNITY): Payer: PPO

## 2021-04-25 DIAGNOSIS — R079 Chest pain, unspecified: Secondary | ICD-10-CM | POA: Diagnosis not present

## 2021-04-25 DIAGNOSIS — I252 Old myocardial infarction: Secondary | ICD-10-CM | POA: Diagnosis not present

## 2021-04-25 DIAGNOSIS — N1831 Chronic kidney disease, stage 3a: Secondary | ICD-10-CM | POA: Diagnosis not present

## 2021-04-25 DIAGNOSIS — J4550 Severe persistent asthma, uncomplicated: Secondary | ICD-10-CM | POA: Diagnosis present

## 2021-04-25 DIAGNOSIS — Z955 Presence of coronary angioplasty implant and graft: Secondary | ICD-10-CM

## 2021-04-25 DIAGNOSIS — Z8249 Family history of ischemic heart disease and other diseases of the circulatory system: Secondary | ICD-10-CM | POA: Diagnosis not present

## 2021-04-25 DIAGNOSIS — E039 Hypothyroidism, unspecified: Secondary | ICD-10-CM | POA: Diagnosis present

## 2021-04-25 DIAGNOSIS — E1122 Type 2 diabetes mellitus with diabetic chronic kidney disease: Secondary | ICD-10-CM | POA: Diagnosis present

## 2021-04-25 DIAGNOSIS — E785 Hyperlipidemia, unspecified: Secondary | ICD-10-CM

## 2021-04-25 DIAGNOSIS — Z20822 Contact with and (suspected) exposure to covid-19: Secondary | ICD-10-CM | POA: Diagnosis present

## 2021-04-25 DIAGNOSIS — Z7901 Long term (current) use of anticoagulants: Secondary | ICD-10-CM

## 2021-04-25 DIAGNOSIS — Z7984 Long term (current) use of oral hypoglycemic drugs: Secondary | ICD-10-CM | POA: Diagnosis not present

## 2021-04-25 DIAGNOSIS — I10 Essential (primary) hypertension: Secondary | ICD-10-CM | POA: Diagnosis present

## 2021-04-25 DIAGNOSIS — Z833 Family history of diabetes mellitus: Secondary | ICD-10-CM | POA: Diagnosis not present

## 2021-04-25 DIAGNOSIS — I214 Non-ST elevation (NSTEMI) myocardial infarction: Principal | ICD-10-CM | POA: Diagnosis present

## 2021-04-25 DIAGNOSIS — Z79899 Other long term (current) drug therapy: Secondary | ICD-10-CM | POA: Diagnosis not present

## 2021-04-25 DIAGNOSIS — E1165 Type 2 diabetes mellitus with hyperglycemia: Secondary | ICD-10-CM | POA: Diagnosis present

## 2021-04-25 DIAGNOSIS — T50916A Underdosing of multiple unspecified drugs, medicaments and biological substances, initial encounter: Secondary | ICD-10-CM | POA: Diagnosis not present

## 2021-04-25 DIAGNOSIS — I2584 Coronary atherosclerosis due to calcified coronary lesion: Secondary | ICD-10-CM | POA: Diagnosis not present

## 2021-04-25 DIAGNOSIS — I2510 Atherosclerotic heart disease of native coronary artery without angina pectoris: Secondary | ICD-10-CM | POA: Diagnosis present

## 2021-04-25 DIAGNOSIS — E782 Mixed hyperlipidemia: Secondary | ICD-10-CM | POA: Diagnosis not present

## 2021-04-25 DIAGNOSIS — M109 Gout, unspecified: Secondary | ICD-10-CM | POA: Diagnosis not present

## 2021-04-25 DIAGNOSIS — I480 Paroxysmal atrial fibrillation: Secondary | ICD-10-CM | POA: Diagnosis not present

## 2021-04-25 DIAGNOSIS — I25110 Atherosclerotic heart disease of native coronary artery with unstable angina pectoris: Secondary | ICD-10-CM | POA: Diagnosis present

## 2021-04-25 DIAGNOSIS — Z91120 Patient's intentional underdosing of medication regimen due to financial hardship: Secondary | ICD-10-CM

## 2021-04-25 DIAGNOSIS — I248 Other forms of acute ischemic heart disease: Secondary | ICD-10-CM | POA: Diagnosis not present

## 2021-04-25 DIAGNOSIS — E119 Type 2 diabetes mellitus without complications: Secondary | ICD-10-CM | POA: Diagnosis not present

## 2021-04-25 DIAGNOSIS — E1169 Type 2 diabetes mellitus with other specified complication: Secondary | ICD-10-CM | POA: Diagnosis not present

## 2021-04-25 DIAGNOSIS — Z7951 Long term (current) use of inhaled steroids: Secondary | ICD-10-CM

## 2021-04-25 DIAGNOSIS — Z79890 Hormone replacement therapy (postmenopausal): Secondary | ICD-10-CM | POA: Diagnosis not present

## 2021-04-25 LAB — COMPLETE METABOLIC PANEL WITH GFR
AG Ratio: 2.1 (calc) (ref 1.0–2.5)
ALT: 17 U/L (ref 6–29)
AST: 19 U/L (ref 10–35)
Albumin: 4.8 g/dL (ref 3.6–5.1)
Alkaline phosphatase (APISO): 105 U/L (ref 37–153)
BUN/Creatinine Ratio: 15 (calc) (ref 6–22)
BUN: 17 mg/dL (ref 7–25)
CO2: 29 mmol/L (ref 20–32)
Calcium: 10.9 mg/dL — ABNORMAL HIGH (ref 8.6–10.4)
Chloride: 102 mmol/L (ref 98–110)
Creat: 1.15 mg/dL — ABNORMAL HIGH (ref 0.60–0.93)
GFR, Est African American: 53 mL/min/{1.73_m2} — ABNORMAL LOW (ref >=60)
GFR, Est Non African American: 46 mL/min/{1.73_m2} — ABNORMAL LOW (ref >=60)
Globulin: 2.3 g/dL (calc) (ref 1.9–3.7)
Glucose, Bld: 188 mg/dL — ABNORMAL HIGH (ref 65–99)
Potassium: 4.3 mmol/L (ref 3.5–5.3)
Sodium: 138 mmol/L (ref 135–146)
Total Bilirubin: 0.4 mg/dL (ref 0.2–1.2)
Total Protein: 7.1 g/dL (ref 6.1–8.1)

## 2021-04-25 LAB — CBC
HCT: 39.9 % (ref 35.0–45.0)
HCT: 37.5 % (ref 36.0–46.0)
Hemoglobin: 13.2 g/dL (ref 11.7–15.5)
Hemoglobin: 12.6 g/dL (ref 12.0–15.0)
MCH: 29.1 pg (ref 27.0–33.0)
MCH: 29.7 pg (ref 26.0–34.0)
MCHC: 33.1 g/dL (ref 32.0–36.0)
MCHC: 33.6 g/dL (ref 30.0–36.0)
MCV: 87.9 fL (ref 80.0–100.0)
MCV: 88.4 fL (ref 80.0–100.0)
MPV: 10.8 fL (ref 7.5–12.5)
Platelets: 355 10*3/uL (ref 140–400)
Platelets: 305 10*3/uL (ref 150–400)
RBC: 4.54 10*6/uL (ref 3.80–5.10)
RBC: 4.24 MIL/uL (ref 3.87–5.11)
RDW: 13.3 % (ref 11.0–15.0)
RDW: 13.9 % (ref 11.5–15.5)
WBC: 10.3 10*3/uL (ref 3.8–10.8)
WBC: 8.8 10*3/uL (ref 4.0–10.5)
nRBC: 0 % (ref 0.0–0.2)

## 2021-04-25 LAB — BASIC METABOLIC PANEL
Anion gap: 10 (ref 5–15)
BUN: 19 mg/dL (ref 8–23)
CO2: 26 mmol/L (ref 22–32)
Calcium: 9.8 mg/dL (ref 8.9–10.3)
Chloride: 99 mmol/L (ref 98–111)
Creatinine, Ser: 1.26 mg/dL — ABNORMAL HIGH (ref 0.44–1.00)
GFR, Estimated: 44 mL/min — ABNORMAL LOW (ref >60)
Glucose, Bld: 235 mg/dL — ABNORMAL HIGH (ref 70–99)
Potassium: 4.1 mmol/L (ref 3.5–5.1)
Sodium: 135 mmol/L (ref 135–145)

## 2021-04-25 LAB — CBG MONITORING, ED: Glucose-Capillary: 138 mg/dL — ABNORMAL HIGH (ref 70–99)

## 2021-04-25 LAB — TROPONIN I (HIGH SENSITIVITY)
Troponin I (High Sensitivity): 379 ng/L (ref <18)
Troponin I (High Sensitivity): 415 ng/L (ref <18)

## 2021-04-25 LAB — RESP PANEL BY RT-PCR (FLU A&B, COVID) ARPGX2
Influenza A by PCR: NEGATIVE
Influenza B by PCR: NEGATIVE
SARS Coronavirus 2 by RT PCR: NEGATIVE

## 2021-04-25 LAB — TROPONIN I: Troponin I: 1021 ng/L (ref <=47)

## 2021-04-25 LAB — TSH: TSH: 2.65 mIU/L (ref 0.40–4.50)

## 2021-04-25 MED ORDER — HEPARIN BOLUS VIA INFUSION
4000.0000 [IU] | Freq: Once | INTRAVENOUS | Status: AC
  Administered 2021-04-25: 4000 [IU] via INTRAVENOUS
  Filled 2021-04-25: qty 4000

## 2021-04-25 MED ORDER — LORATADINE 10 MG PO TABS
10.0000 mg | ORAL_TABLET | Freq: Every evening | ORAL | Status: DC
  Administered 2021-04-26: 10 mg via ORAL
  Filled 2021-04-25 (×2): qty 1

## 2021-04-25 MED ORDER — NITROGLYCERIN 0.4 MG SL SUBL: 0.4000 mg | SUBLINGUAL_TABLET | SUBLINGUAL | Status: DC | PRN

## 2021-04-25 MED ORDER — IRBESARTAN 150 MG PO TABS
300.0000 mg | ORAL_TABLET | Freq: Every day | ORAL | Status: DC
  Administered 2021-04-26 – 2021-04-27 (×2): 300 mg via ORAL
  Filled 2021-04-25: qty 1
  Filled 2021-04-25: qty 2

## 2021-04-25 MED ORDER — MONTELUKAST SODIUM 10 MG PO TABS
10.0000 mg | ORAL_TABLET | Freq: Every day | ORAL | Status: DC
  Administered 2021-04-26 (×2): 10 mg via ORAL
  Filled 2021-04-25 (×2): qty 1

## 2021-04-25 MED ORDER — ONDANSETRON HCL 4 MG/2ML IJ SOLN: 4.0000 mg | Freq: Four times a day (QID) | INTRAMUSCULAR | Status: DC | PRN

## 2021-04-25 MED ORDER — HYDROCHLOROTHIAZIDE 12.5 MG PO CAPS
12.5000 mg | ORAL_CAPSULE | Freq: Every day | ORAL | Status: DC
  Administered 2021-04-26 – 2021-04-27 (×2): 12.5 mg via ORAL
  Filled 2021-04-25 (×2): qty 1

## 2021-04-25 MED ORDER — ALBUTEROL SULFATE HFA 108 (90 BASE) MCG/ACT IN AERS: 2.0000 | INHALATION_SPRAY | Freq: Four times a day (QID) | RESPIRATORY_TRACT | Status: DC | PRN

## 2021-04-25 MED ORDER — ASPIRIN EC 81 MG PO TBEC
81.0000 mg | DELAYED_RELEASE_TABLET | Freq: Every day | ORAL | Status: DC
  Administered 2021-04-26 – 2021-04-27 (×2): 81 mg via ORAL
  Filled 2021-04-25 (×2): qty 1

## 2021-04-25 MED ORDER — FLUTICASONE FUROATE-VILANTEROL 200-25 MCG/INH IN AEPB
1.0000 | INHALATION_SPRAY | Freq: Every day | RESPIRATORY_TRACT | Status: DC
  Administered 2021-04-27: 1 via RESPIRATORY_TRACT
  Filled 2021-04-25: qty 28

## 2021-04-25 MED ORDER — ATORVASTATIN CALCIUM 80 MG PO TABS
80.0000 mg | ORAL_TABLET | Freq: Every day | ORAL | Status: DC
  Administered 2021-04-26 (×2): 80 mg via ORAL
  Filled 2021-04-25: qty 1
  Filled 2021-04-25: qty 2

## 2021-04-25 MED ORDER — ASPIRIN 81 MG PO CHEW
325.0000 mg | CHEWABLE_TABLET | Freq: Once | ORAL | Status: AC
  Administered 2021-04-25: 325 mg via ORAL
  Filled 2021-04-25: qty 5

## 2021-04-25 MED ORDER — FLUTICASONE PROPIONATE 50 MCG/ACT NA SUSP
1.0000 | Freq: Every day | NASAL | Status: DC
  Administered 2021-04-26 – 2021-04-27 (×2): 1 via NASAL
  Filled 2021-04-25: qty 16

## 2021-04-25 MED ORDER — IRBESARTAN-HYDROCHLOROTHIAZIDE 300-12.5 MG PO TABS: 1.0000 | ORAL_TABLET | Freq: Every day | ORAL | Status: DC

## 2021-04-25 MED ORDER — ALLOPURINOL 300 MG PO TABS
300.0000 mg | ORAL_TABLET | Freq: Two times a day (BID) | ORAL | Status: DC
  Administered 2021-04-26 – 2021-04-27 (×4): 300 mg via ORAL
  Filled 2021-04-25 (×4): qty 1

## 2021-04-25 MED ORDER — ACETAMINOPHEN 325 MG PO TABS
650.0000 mg | ORAL_TABLET | ORAL | Status: DC | PRN
  Administered 2021-04-26 – 2021-04-27 (×2): 650 mg via ORAL
  Filled 2021-04-25 (×2): qty 2

## 2021-04-25 MED ORDER — LEVOTHYROXINE SODIUM 112 MCG PO TABS
112.0000 ug | ORAL_TABLET | Freq: Every day | ORAL | Status: DC
  Administered 2021-04-26 – 2021-04-27 (×2): 112 ug via ORAL
  Filled 2021-04-25 (×2): qty 1

## 2021-04-25 MED ORDER — INSULIN ASPART 100 UNIT/ML IJ SOLN
0.0000 [IU] | Freq: Three times a day (TID) | INTRAMUSCULAR | Status: DC
  Administered 2021-04-26 (×3): 2 [IU] via SUBCUTANEOUS
  Administered 2021-04-27: 1 [IU] via SUBCUTANEOUS

## 2021-04-25 MED ORDER — HEPARIN (PORCINE) 25000 UT/250ML-% IV SOLN
1100.0000 [IU]/h | INTRAVENOUS | Status: DC
  Administered 2021-04-25: 950 [IU]/h via INTRAVENOUS
  Filled 2021-04-25: qty 250

## 2021-04-25 MED ORDER — CARVEDILOL 3.125 MG PO TABS
3.1250 mg | ORAL_TABLET | Freq: Two times a day (BID) | ORAL | Status: DC
  Administered 2021-04-26 (×2): 3.125 mg via ORAL
  Filled 2021-04-25 (×2): qty 1

## 2021-04-25 MED ORDER — UMECLIDINIUM BROMIDE 62.5 MCG/INH IN AEPB
1.0000 | INHALATION_SPRAY | Freq: Every day | RESPIRATORY_TRACT | Status: DC
  Administered 2021-04-27: 1 via RESPIRATORY_TRACT
  Filled 2021-04-25: qty 7

## 2021-04-25 NOTE — Telephone Encounter (Signed)
Called and advised pt of critical lab level of Troponin and told her that she will need to go to the ED NOW. She voiced understanding and asked what should she tell them. I told her to let them know that her troponin level was elevated and that she inform them of this.

## 2021-04-25 NOTE — ED Triage Notes (Signed)
Pt sent by her PCP for an elevated troponin. C/o chest burning similar to indigestion over the weekend. Denies chest pain/shortness of breath at this time.

## 2021-04-25 NOTE — ED Provider Notes (Signed)
Emergency Medicine Provider Triage Evaluation Note  Tara Campbell , a 75 y.o. female  was evaluated in triage.  Pt complains of elevated troponin at PCPs office.  Patient had been experiencing several days of intermittent chest pain.  Last episode of chest pain was around 9 AM yesterday, May 23.  This episode was central chest pressure and lasted for approximately 30 minutes before resolution.  She has not had another instance of chest discomfort.  She had labs drawn at PCP office around 1 PM yesterday and those results were noted today.  Troponin over 1000.  Review of Systems  Positive: Chest pain, elevated troponin Negative: Vomiting, diarrhea, abdominal pain, syncope  Physical Exam  BP (!) 152/71 (BP Location: Left Arm)   Pulse 80   Temp 98.2 F (36.8 C) (Oral)   Resp 15   SpO2 99%  Gen:   Awake, no distress   Resp:  Normal effort  MSK:   Moves extremities without difficulty  Other:    Medical Decision Making  Medically screening exam initiated at 5:53 PM.  Appropriate orders placed.  Oneida Arenas was informed that the remainder of the evaluation will be completed by another provider, this initial triage assessment does not replace that evaluation, and the importance of remaining in the ED until their evaluation is complete.     Anselm Pancoast, PA-C 04/25/21 1839    Charlynne Pander, MD 04/26/21 [phone]

## 2021-04-25 NOTE — Progress Notes (Signed)
ANTICOAGULATION CONSULT NOTE - Initial Consult  Pharmacy Consult for heparin Indication: chest pain/ACS  Allergies  Allergen Reactions  . Latex   . Sulfa Antibiotics Rash    Patient Measurements:   Heparin Dosing Weight: 74.4 kg   Vital Signs: Temp: 98.3 F (36.8 C) (05/24 1934) Temp Source: Oral (05/24 1934) BP: 134/72 (05/24 2030) Pulse Rate: 76 (05/24 2030)  Labs: Recent Labs    04/24/21 0000 04/25/21 1808  HGB 13.2 12.6  HCT 39.9 37.5  PLT 355 305  CREATININE 1.15* 1.26*  TROPONINIHS  --  379*    Estimated Creatinine Clearance: 40.3 mL/min (A) (by C-G formula based on SCr of 1.26 mg/dL (H)).   Medical History: Past Medical History:  Diagnosis Date  . Allergy   . Arthritis   . Asthma   . Colon polyps   . Diabetes mellitus without complication ([facility])   . Gout   . Heart attack ([facility]) 07/30/2016   PCI, 2 stents  . Hyperlipidemia   . Hypertension   . Internal hemorrhoids   . Left rotator cuff tear   . 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 Otomycosis of right ear 09/27/2017  . Paroxysmal atrial fibrillation ([facility])   . Thyroid disease     Medications:  (Not in a hospital admission)   Assessment: 7 YOF with h/o Afib who presented with chest pain concerning for ACS. Pharmacy consulted to start IV heparin.   H/H and Plt wnl. SCr 1.26. Of note, patient states her last dose of Eliquis was on Sunday.   Goal of Therapy:  Heparin level 0.3-0.7 units/ml Monitor platelets by anticoagulation protocol: Yes   Plan:  -Heparin 4000 units IV bolus followed heparin infusion at 950 units/hr -F/u 8 hr HL -Monitor daily HL, CBC and s/s of bleeding   Vinnie Level, PharmD., BCPS, BCCCP Clinical Pharmacist Please refer to [facility] for unit-specific pharmacist

## 2021-04-25 NOTE — ED Notes (Signed)
Critical troponin of 379 given to Tegelar and Laural Benes MD

## 2021-04-25 NOTE — ED Provider Notes (Signed)
[facility] EMERGENCY DEPARTMENT Provider Note   CSN: [id number] Arrival date & time: 04/25/21  1744   History Chief Complaint  Patient presents with  . Chest Pain   Tara Campbell is a 75 y.o. female with past medical history significant for CAD s/p (PCI in 2017), paroxysmal atrial fibrillation, T2DM, HTN, and HLD who presents for evaluation of chest pain.  Patient states that she has been in her normal state of health except for increased stress over the past few months. She states that on Saturday morning, approximately forty five minutes after eating breakfast she had an intense sensation of "something stuck in my throat" in her central chest that lasted for approximately forty five minutes at rest. Her symptoms resolved spontaneously and were not associated with any further identifiable symptoms. Patient had recurrence of this same sensation on Sunday morning, although at that time she experienced one episode of vomiting. Due to concern for her symptoms, she scheduled an appointment with her PCP on Monday afternoon. Patient's PCP collected an EKG and labs which resulted this afternoon with a troponin of 1,021. Patient was contacted and instructed to present immediately to the emergency department for evaluation. Patient denies ongoing chest pain, shortness of breath, nausea, vomiting, diaphoresis at this time.   Past Medical History:  Diagnosis Date  . Allergy   . Arthritis   . Asthma   . Colon polyps   . Diabetes mellitus without complication ([place])   . Gout   . Heart attack ([place]) 07/30/2016   PCI, 2 stents  . Hyperlipidemia   . Hypertension   . Internal hemorrhoids   . Left rotator cuff tear   . 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 Otomycosis of right ear 09/27/2017  . Paroxysmal atrial fibrillation ([facility])   . Thyroid disease    Patient Active Problem List   Diagnosis Date Noted  . Hilar adenopathy 10/13/2018  . Mediastinal adenopathy 10/13/2018  .  Dupuytren's contracture of left hand 08/28/2018  . Trigger finger, left index finger 08/28/2018  . Non-seasonal allergic rhinitis 08/26/2018  . Type 2 diabetes mellitus with diabetic chronic kidney disease ([place]) 08/06/2018  . Controlled diabetes mellitus type 2 with complications ([place]) 76/80/8811  . Class 1 obesity due to excess calories with serious comorbidity in adult 12/27/2017  . 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 Encounter for long-term (current) use of medications 11/28/2017  . Anticoagulant long-term use 10/21/2017  . Sensorineural hearing loss (SNHL) of both ears 09/27/2017  . Hypertension goal BP (blood pressure) < 140/90 08/29/2017  . Coronary artery disease involving native coronary artery of native heart without angina pectoris 08/29/2017  . Hearing difficulty of both ears 05/29/2017  . Trochanteric bursitis, right hip 05/29/2017  . Gout of ankle 04/08/2017  . History of myocardial infarct at age greater than 99 years 01/23/2017  . Mixed diabetic hyperlipidemia associated with type 2 diabetes mellitus ([place]) 01/23/2017  . Acquired hypothyroidism 01/23/2017  . Severe persistent allergic asthma 01/16/2017  . Paroxysmal atrial fibrillation ([place]) 01/16/2017  . Cardiomegaly 01/16/2017   Past Surgical History:  Procedure Laterality Date  . ANGIOPLASTY    . COLONOSCOPY W/ POLYPECTOMY  01/04/2015  . HEMORRHOID BANDING      OB History   No obstetric history on file.     Family History  Problem Relation Age of Onset  . Hypertension Mother   . Hyperlipidemia Mother   . Glaucoma Father   . Heart disease Father   . Hypertension Father   .  Diabetes Father   . Hyperlipidemia Father   . Skin cancer Father   . Diabetes Paternal Grandmother   . Colon cancer Neg Hx   . Esophageal cancer Neg Hx    Social History   Tobacco Use  . Smoking status: Never Smoker  . Smokeless tobacco: Never Used  Vaping Use  . Vaping Use: Never used  Substance Use Topics  . Alcohol use: Yes     Comment: Rare  . Drug use: No   Home Medications Prior to Admission medications   Medication Sig Start Date End Date Taking? Authorizing Provider  albuterol (VENTOLIN HFA) 108 (90 Base) MCG/ACT inhaler Inhale 2 puffs into the lungs every 6 (six) hours as needed for wheezing. 11/16/19   Hali Marry, MD  allopurinol (ZYLOPRIM) 300 MG tablet Take 1 tablet (300 mg total) by mouth 2 (two) times daily. 03/24/21   Hali Marry, MD  apixaban (ELIQUIS) 5 MG TABS tablet Take 1 tablet (5 mg total) by mouth 2 (two) times daily. 03/24/21   Hali Marry, MD  atenolol (TENORMIN) 100 MG tablet Take 1 tablet (100 mg total) by mouth daily. 03/24/21   Hali Marry, MD  atorvastatin (LIPITOR) 80 MG tablet Take 1 tablet (80 mg total) by mouth at bedtime. 03/24/21   Hali Marry, MD  blood glucose meter kit and supplies KIT Check morning fasting blood glucose daily and up to 4 times daily as directed 04/03/18   [name], PA-C  Cyanocobalamin (B-12 PO) Take 1,000 mg by mouth daily.    [provider]  EPINEPHrine 0.3 mg/0.3 mL IJ SOAJ injection Inject 0.3 mg into the muscle as needed for anaphylaxis. Call 9-1-1 after use. 03/06/21   Mannam, Hart Robinsons, MD  fluticasone (FLONASE) 50 MCG/ACT nasal spray Place 1 spray into both nostrils daily. 08/25/18   [name], PA-C  fluticasone furoate-vilanterol (BREO ELLIPTA) 200-25 MCG/INH AEPB Inhale 1 puff into the lungs daily. Needs appt for further refills 09/28/20   Parrett, [name] S, NP  glucose blood test strip To be used twice daily for testing blood sugars. E11.65 04/03/21   Hali Marry, MD  ipratropium-albuterol (DUONEB) 0.5-2.5 (3) MG/3ML SOLN Take 3 mLs by nebulization every 6 (six) hours as needed. 01/25/20   Hali Marry, MD  irbesartan-hydrochlorothiazide (AVALIDE) 300-12.5 MG tablet Take 1 tablet by mouth daily. 03/24/21   Hali Marry, MD  levalbuterol [facility]  HFA) 45 MCG/ACT inhaler Inhale 1-2 puffs into the lungs every 6 (six) hours as needed for wheezing or shortness of breath. 01/06/17   [provider]  levocetirizine Harlow Ohms ALLERGY 24HR) 5 MG tablet Take 1 tablet (5 mg total) by mouth every evening. 08/25/18   [name], PA-C  levothyroxine (SYNTHROID) 112 MCG tablet Take 1 tablet (112 mcg total) by mouth daily before breakfast. 03/24/21   Hali Marry, MD  Mepolizumab ([name]) 100 MG/ML SOAJ Inject 1 mL (100 mg total) into the skin every 28 (twenty-eight) days. 03/08/21   Marshell Garfinkel, MD  metFORMIN (GLUCOPHAGE) 500 MG tablet Take 2 tablets (1,000 mg total) by mouth 2 (two) times daily with a meal. 04/24/21 07/23/21  Hali Marry, MD  montelukast (SINGULAIR) 10 MG tablet Take 1 tablet (10 mg total) by mouth at bedtime. 10/05/19   Hali Marry, MD  nitroGLYCERIN (NITROSTAT) 0.4 MG SL tablet Place 1 tablet (0.4 mg total) under the tongue every 5 (five) minutes as needed for chest pain (or tightness). 04/03/21  Hali Marry, MD  Omega-3 Fatty Acids (FISH OIL PO) Take 5,000 Units by mouth daily.    [provider]  Semaglutide,0.25 or 0.5MG/DOS, (OZEMPIC, 0.25 OR 0.5 MG/DOSE,) 2 MG/1.5ML SOPN Inject 0.25 mg into the skin once a week for 28 days, THEN 0.5 mg once a week for 28 days. 04/03/21 05/29/21  Hali Marry, MD  SPIRIVA RESPIMAT 2.5 MCG/ACT AERS Inhale 2 puffs into the lungs daily. 11/18/20   Parrett, Fonnie Mu, NP   Allergies    Latex and Sulfa antibiotics  Review of Systems   Review of Systems  Constitutional: Negative for chills and fever.  HENT: Negative for ear pain and sore throat.   Eyes: Negative for pain and visual disturbance.  Respiratory: Negative for cough and shortness of breath.   Cardiovascular: Positive for chest pain. Negative for palpitations.  Gastrointestinal: Positive for vomiting. Negative for abdominal pain.  Genitourinary: Negative for dysuria and  hematuria.  Musculoskeletal: Negative for arthralgias and back pain.  Skin: Negative for color change and rash.  Neurological: Negative for seizures and syncope.  All other systems reviewed and are negative.  Physical Exam Updated Vital Signs BP (!) 154/74 (BP Location: Left Arm)   Pulse 80   Temp 98.3 F (36.8 C) (Oral)   Resp 16   SpO2 100%   Physical Exam Vitals and nursing note reviewed.  Constitutional:      General: She is not in acute distress.    Appearance: She is well-developed.  HENT:     Head: Normocephalic and atraumatic.  Eyes:     Conjunctiva/sclera: Conjunctivae normal.  Cardiovascular:     Rate and Rhythm: Normal rate and regular rhythm.     Heart sounds: No murmur heard.   Pulmonary:     Effort: Pulmonary effort is normal. No respiratory distress.     Breath sounds: Normal breath sounds.  Abdominal:     Palpations: Abdomen is soft.     Tenderness: There is no abdominal tenderness.  Musculoskeletal:     Cervical back: Neck supple.  Skin:    General: Skin is warm and dry.  Neurological:     General: No focal deficit present.     Mental Status: She is alert and oriented to person, place, and time.  Psychiatric:        Mood and Affect: Mood normal.        Behavior: Behavior normal.    ED Results / Procedures / Treatments   Labs (all labs ordered are listed, but only abnormal results are displayed) Labs Reviewed  BASIC METABOLIC PANEL - Abnormal; Notable for the following components:      Result Value   Glucose, Bld 235 (*)    Creatinine, Ser 1.26 (*)    GFR, Estimated 44 (*)    All other components within normal limits  TROPONIN I (HIGH SENSITIVITY) - Abnormal; Notable for the following components:   Troponin I (High Sensitivity) 379 (*)    All other components within normal limits  RESP PANEL BY RT-PCR (FLU A&B, COVID) ARPGX2  CBC  TROPONIN I (HIGH SENSITIVITY)   EKG EKG Interpretation  Date/Time:  Tuesday Apr 25 2021 17:55:06  EDT Ventricular Rate:  78 PR Interval:  182 QRS Duration: 74 QT Interval:  374 QTC Calculation: 426 R Axis:   25 Text Interpretation: Normal sinus rhythm Low voltage QRS Cannot rule out Anterior infarct , age undetermined Abnormal ECG When compared to prior, similar appearance. No STEMI Confirmed by Tegeler, Gerald Stabs (  [id number]) on 04/25/2021 6:20:23 PM  Radiology DG Chest 2 View  Result Date: 04/25/2021 CLINICAL DATA:  Chest pain EXAM: CHEST - 2 VIEW COMPARISON:  08/16/2020 FINDINGS: Lungs are clear. No pneumothorax or pleural effusion. Cardiac size within normal limits. Pulmonary vascularity is normal. Degenerative changes are seen within the thoracic spine and left shoulder. No acute bone abnormality. IMPRESSION: No active cardiopulmonary disease. Electronically Signed   By: Fidela Salisbury MD   On: 04/25/2021 20:23    Medications Ordered in ED Medications  aspirin EC tablet 325 mg (has no administration in time range)    ED Course  I have reviewed the triage vital signs and the nursing notes.  Pertinent labs & imaging results that were available during my care of the patient were reviewed by me and considered in my medical decision making (see chart for details).    MDM Rules/Calculators/A&P                          Ms. Trinita Devlin is a pleasant 75 year old woman with prior myocardial infarction status post PCI in 2017 who presents for evaluation of three episodes of the sensation of food stuck in her throat found to have tropinemia by her primary care physician and advised to come to ED. Patient is asymptomatic at this time with downtrending troponin to 379 on repeat. Patient's presentation is likely delayed presentation for NSTEMI. Discussed patient's case with cardiology with recommendation for aspirin and heparin and admission to cardiology.  Final Clinical Impression(s) / ED Diagnoses Final diagnoses:  NSTEMI (non-ST elevated myocardial infarction) [facility])   Rx / DC Orders ED  Discharge Orders    None       Cato Mulligan, MD 04/25/21 2027    Tegeler, Gwenyth Allegra, MD 04/26/21 [phone]

## 2021-04-25 NOTE — H&P (Signed)
Cardiology Admission History and Physical:   Patient ID: Tara Campbell MRN: [id number]; DOB: [birth year]   Admission date: 04/25/2021  PCP:  Hali Marry, MD   [facility] Providers Cardiologist:  Kirk Ruths, MD  Click here to update MD or APP on Care Team, Refresh:1}     Chief Complaint:  Chest pain   Patient Profile:   Tara Campbell is a 75 y.o. female with HTN, HLD, CAD (prior PCI to RCA), pAfib (CV 6) who is being seen 04/25/2021 for the evaluation of chest pain and NSTEMI.  History of Present Illness:   Tara Campbell is a 75 yo female with CAD (prior PCI to RCA), pAfib on apixaban, HTN, HLD, T2DM, severe persistent Asthma who presents today to the emergency department after being found on outpatient laboratory evaluation to have an elevated troponin to the 1000's.  Notes that she developed over the last couple of days burning chest pressure in her central chest.  Noted that it was worse after eating and occurred several times.  Noticed some nausea and had one episode of vomiting.  She wanted to be evaluated by her PCP where she was sent for labs and EKG.  Nonspecific T wave inversion.  On her outpatient labs her CBC was unremarkable with a hemoglobin 13.2 and a white count of 10.3.  Her creatinine was 1.15.  And her troponin was 1021.  On presentation to the emergency department she is hemodynamically stable and notably not having active chest pain at this time.  Has had some intermittent episodes throughout the last 24 to 48 hours.  Repeat labs stable with a creatinine of 1.26 and now a troponin of 379.  Cardiology was called for evaluation due to elevated troponin that is downtrending. She is a nonsmoker.  Last lipid panel in April 2022 with Total Chol 266 and LDL 165. A1c 8.7% (has been difficult to control several years)   Past Medical History:  Diagnosis Date  . Allergy   . Arthritis   . Asthma   . Colon polyps   . Diabetes mellitus without complication  ([place])   . Gout   . Heart attack ([place]) 07/30/2016   PCI, 2 stents  . Hyperlipidemia   . Hypertension   . Internal hemorrhoids   . Left rotator cuff tear   . 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 Otomycosis of right ear 09/27/2017  . Paroxysmal atrial fibrillation ([facility])   . Thyroid disease     Past Surgical History:  Procedure Laterality Date  . ANGIOPLASTY    . COLONOSCOPY W/ POLYPECTOMY  01/04/2015  . HEMORRHOID BANDING       Medications Prior to Admission: Prior to Admission medications   Medication Sig Start Date End Date Taking? Authorizing Provider  albuterol (VENTOLIN HFA) 108 (90 Base) MCG/ACT inhaler Inhale 2 puffs into the lungs every 6 (six) hours as needed for wheezing. 11/16/19   Hali Marry, MD  allopurinol (ZYLOPRIM) 300 MG tablet Take 1 tablet (300 mg total) by mouth 2 (two) times daily. 03/24/21   Hali Marry, MD  apixaban (ELIQUIS) 5 MG TABS tablet Take 1 tablet (5 mg total) by mouth 2 (two) times daily. 03/24/21   Hali Marry, MD  atenolol (TENORMIN) 100 MG tablet Take 1 tablet (100 mg total) by mouth daily. 03/24/21   Hali Marry, MD  atorvastatin (LIPITOR) 80 MG tablet Take 1 tablet (80 mg total) by mouth at bedtime. 03/24/21   Hali Marry, MD  blood glucose meter kit and supplies KIT Check morning fasting blood glucose daily and up to 4 times daily as directed 04/03/18   [name], PA-C  Cyanocobalamin (B-12 PO) Take 1,000 mg by mouth daily.    [provider]  EPINEPHrine 0.3 mg/0.3 mL IJ SOAJ injection Inject 0.3 mg into the muscle as needed for anaphylaxis. Call 9-1-1 after use. 03/06/21   Mannam, Hart Robinsons, MD  fluticasone (FLONASE) 50 MCG/ACT nasal spray Place 1 spray into both nostrils daily. 08/25/18   [name], PA-C  fluticasone furoate-vilanterol (BREO ELLIPTA) 200-25 MCG/INH AEPB Inhale 1 puff into the lungs daily. Needs appt for further refills 09/28/20   Parrett, [name] S,  NP  glucose blood test strip To be used twice daily for testing blood sugars. E11.65 04/03/21   Hali Marry, MD  ipratropium-albuterol (DUONEB) 0.5-2.5 (3) MG/3ML SOLN Take 3 mLs by nebulization every 6 (six) hours as needed. 01/25/20   Hali Marry, MD  irbesartan-hydrochlorothiazide (AVALIDE) 300-12.5 MG tablet Take 1 tablet by mouth daily. 03/24/21   Hali Marry, MD  levalbuterol [facility] HFA) 45 MCG/ACT inhaler Inhale 1-2 puffs into the lungs every 6 (six) hours as needed for wheezing or shortness of breath. 01/06/17   [provider]  levocetirizine Harlow Ohms ALLERGY 24HR) 5 MG tablet Take 1 tablet (5 mg total) by mouth every evening. 08/25/18   [name], PA-C  levothyroxine (SYNTHROID) 112 MCG tablet Take 1 tablet (112 mcg total) by mouth daily before breakfast. 03/24/21   Hali Marry, MD  Mepolizumab ([name]) 100 MG/ML SOAJ Inject 1 mL (100 mg total) into the skin every 28 (twenty-eight) days. 03/08/21   Marshell Garfinkel, MD  metFORMIN (GLUCOPHAGE) 500 MG tablet Take 2 tablets (1,000 mg total) by mouth 2 (two) times daily with a meal. 04/24/21 07/23/21  Hali Marry, MD  montelukast (SINGULAIR) 10 MG tablet Take 1 tablet (10 mg total) by mouth at bedtime. 10/05/19   Hali Marry, MD  nitroGLYCERIN (NITROSTAT) 0.4 MG SL tablet Place 1 tablet (0.4 mg total) under the tongue every 5 (five) minutes as needed for chest pain (or tightness). 04/03/21   Hali Marry, MD  Omega-3 Fatty Acids (FISH OIL PO) Take 5,000 Units by mouth daily.    [provider]  Semaglutide,0.25 or 0.5MG/DOS, (OZEMPIC, 0.25 OR 0.5 MG/DOSE,) 2 MG/1.5ML SOPN Inject 0.25 mg into the skin once a week for 28 days, THEN 0.5 mg once a week for 28 days. 04/03/21 05/29/21  Hali Marry, MD  SPIRIVA RESPIMAT 2.5 MCG/ACT AERS Inhale 2 puffs into the lungs daily. 11/18/20   Parrett, Fonnie Mu, NP     Allergies:    Allergies  Allergen Reactions  .  Latex   . Sulfa Antibiotics Rash    Social History:   Social History   Socioeconomic History  . Marital status: Divorced    Spouse name: Not on file  . Number of children: 2  . Years of education: 13.5  . Highest education level: 12th grade  Occupational History    Comment: retired  Tobacco Use  . Smoking status: Never Smoker  . Smokeless tobacco: Never Used  Vaping Use  . Vaping Use: Never used  Substance and Sexual Activity  . Alcohol use: Yes    Comment: Rare  . Drug use: No  . Sexual activity: Not Currently  Other Topics Concern  . Not on file  Social History Narrative   Lives alone. Likes to  crafts and paint in her free time.   Social Determinants of Health   Financial Resource Strain: Low Risk   . Difficulty of Paying Living Expenses: Not hard at all  Food Insecurity: No Food Insecurity  . Worried About Charity fundraiser in the Last Year: Never true  . Ran Out of Food in the Last Year: Never true  Transportation Needs: No Transportation Needs  . Lack of Transportation (Medical): No  . Lack of Transportation (Non-Medical): No  Physical Activity: Inactive  . Days of Exercise per Week: 0 days  . Minutes of Exercise per Session: 0 min  Stress: No Stress Concern Present  . Feeling of Stress : Not at all  Social Connections: Moderately Isolated  . Frequency of Communication with Friends and Family: More than three times a week  . Frequency of Social Gatherings with Friends and Family: More than three times a week  . Attends Religious Services: Never  . Active Member of Clubs or Organizations: Yes  . Attends Archivist Meetings: More than 4 times per year  . Marital Status: Divorced  Human resources officer Violence: Not At Risk  . Fear of Current or Ex-Partner: No  . Emotionally Abused: No  . Physically Abused: No  . Sexually Abused: No    Family History:   The patient's family history includes Diabetes in her father and paternal grandmother; Glaucoma  in her father; Heart disease in her father; Hyperlipidemia in her father and mother; Hypertension in her father and mother; Skin cancer in her father. There is no history of Colon cancer or Esophageal cancer.    ROS:  Please see the history of present illness.  All other ROS reviewed and negative.     Physical Exam/Data:   Vitals:   04/25/21 1749 04/25/21 1934 04/25/21 2030  BP: (!) 152/71 (!) 154/74 134/72  Pulse: 80 80 76  Resp: 15 16 (!) 21  Temp: 98.2 F (36.8 C) 98.3 F (36.8 C)   TempSrc: Oral Oral   SpO2: 99% 100% 99%   No intake or output data in the 24 hours ending 04/25/21 2058 Last 3 Weights 04/24/2021 04/06/2021 04/03/2021  Weight (lbs) 195 lb 195 lb 0.6 oz 194 lb  Weight (kg) 88.451 kg 88.47 kg 87.998 kg     There is no height or weight on file to calculate BMI.  General:  Well nourished, well developed, in no acute distress HEENT: normal Lymph: no adenopathy Neck: no JVD Endocrine:  No thryomegaly Vascular: No carotid bruits; FA pulses 2+ bilaterally without bruits  Cardiac:  normal S1, S2; RRR; no murmur  Lungs:  clear to auscultation bilaterally, no wheezing, rhonchi or rales  Abd: soft, nontender, no hepatomegaly  Ext: no edema Musculoskeletal:  No deformities, BUE and BLE strength normal and equal Skin: warm and dry  Neuro:  CNs 2-12 intact, no focal abnormalities noted Psych:  Normal affect    EKG:  The ECG that was done  was personally reviewed and demonstrates sinus rhythm, nonspecific twi  Relevant CV Studies: Echo in 08/2020: 1. Left ventricular ejection fraction, by estimation, is 60 to 65%. The  left ventricle has normal function. The left ventricle has no regional  wall motion abnormalities. There is mild left ventricular hypertrophy.  Left ventricular diastolic parameters  are consistent with Grade I diastolic dysfunction (impaired relaxation).  2. Right ventricular systolic function is normal. The right ventricular  size is normal.  3. The  mitral valve is normal in structure.  Mild mitral valve  regurgitation. No evidence of mitral stenosis.  4. The aortic valve is normal in structure. Aortic valve regurgitation is  not visualized. No aortic stenosis is present.  5. The inferior vena cava is normal in size with greater than 50%  respiratory variability, suggesting right atrial pressure of 3 mmHg.   Laboratory Data:  High Sensitivity Troponin:   Recent Labs  Lab 04/25/21 1808  TROPONINIHS 379*      Chemistry Recent Labs  Lab 04/24/21 0000 04/25/21 1808  NA 138 135  K 4.3 4.1  CL 102 99  CO2 29 26  GLUCOSE 188* 235*  BUN 17 19  CREATININE 1.15* 1.26*  CALCIUM 10.9* 9.8  GFRNONAA 46* 44*  GFRAA 53*  --   ANIONGAP  --  10    Recent Labs  Lab 04/24/21 0000  PROT 7.1  AST 19  ALT 17  BILITOT 0.4   Hematology Recent Labs  Lab 04/24/21 0000 04/25/21 1808  WBC 10.3 8.8  RBC 4.54 4.24  HGB 13.2 12.6  HCT 39.9 37.5  MCV 87.9 88.4  MCH 29.1 29.7  MCHC 33.1 33.6  RDW 13.3 13.9  PLT 355 305   BNPNo results for input(s): BNP, PROBNP in the last 168 hours.  DDimer No results for input(s): DDIMER in the last 168 hours.   Radiology/Studies:  DG Chest 2 View  Result Date: 04/25/2021 CLINICAL DATA:  Chest pain EXAM: CHEST - 2 VIEW COMPARISON:  08/16/2020 FINDINGS: Lungs are clear. No pneumothorax or pleural effusion. Cardiac size within normal limits. Pulmonary vascularity is normal. Degenerative changes are seen within the thoracic spine and left shoulder. No acute bone abnormality. IMPRESSION: No active cardiopulmonary disease. Electronically Signed   By: Fidela Salisbury MD   On: 04/25/2021 20:23     Assessment and Plan:   1. NSTEMI.  Patient with a prior history of coronary artery disease and had a PCI in 2018 to her right coronary artery at the time she had stenting done.  She has several risk factors including poorly controlled hyperlipidemia and poorly controlled type 2 diabetes.  Notably, she  stopped taking her medications April 2022 due to affordability and pill burden.  At that time her levels were extremely elevated.  Given her symptoms and elevated troponin that is downtrending, I do think this is a missed event.  We will still treat as active NSTEMI with full-strength systemic anticoagulation and full dose aspirin.  Plan for left heart catheterization tomorrow.  Plan for echocardiography tomorrow.  Will need cardiac rehab.  Held her atenolol and started low-dose carvedilol given elevated blood pressures in the emergency department. 1. Load with asa, start on heparin 2. Labs including lipids, a1c 3. Switch atenolol to carvedilol 4. Echo tomorrow  5. NPO after midnight for left heart cath tomorrow 2.   HTN.  Continue home antihypertensives except as noted above switch to carvedilol. 3.   HLD.  On high-dose atorvastatin.  Continue this. 4. PAFib.  Hold apixaban given starting on systemic anticoagulation with heparin. 5. Severe Persistent Asthma.  She is not in acute  exacerbation right now so continue home controllers. 6. T2DM.  Will start sliding scale insulin.  Holding home antihyperglycemic agents.  Should consider empagliflozin given history on discharge.  However, hesitate due to affordability issues.   Risk Assessment/Risk Scores:    TIMI Risk Score for Unstable Angina or Non-ST Elevation MI:   The patient's TIMI risk score is 5, which indicates a 26% risk of  all cause mortality, new or recurrent myocardial infarction or need for urgent revascularization in the next 14 days.    CHA2DS2-VASc Score = 5  This indicates a 7.2% annual risk of stroke. The patient's score is based upon: CHF History: No HTN History: Yes Diabetes History: Yes Stroke History: No Vascular Disease History: No Age Score: 2 Gender Score: 1      Severity of Illness: The appropriate patient status for this patient is INPATIENT. Inpatient status is judged to be reasonable and necessary in order  to provide the required intensity of service to ensure the patient's safety. The patient's presenting symptoms, physical exam findings, and initial radiographic and laboratory data in the context of their chronic comorbidities is felt to place them at high risk for further clinical deterioration. Furthermore, it is not anticipated that the patient will be medically stable for discharge from the hospital within 2 midnights of admission. The following factors support the patient status of inpatient.   " The patient's presenting symptoms include chest pain, elevated troponin. " The worrisome physical exam findings include chest pain. " The initial radiographic and laboratory data are worrisome because of elevated troponin. " The chronic co-morbidities include CAD, T2DM, HLD.   * I certify that at the point of admission it is my clinical judgment that the patient will require inpatient hospital care spanning beyond 2 midnights from the point of admission due to high intensity of service, high risk for further deterioration and high frequency of surveillance required.*    For questions or updates, please contact [place] Please consult [email or web address] for contact info under     Signed, Doyne Keel, MD  04/25/2021 8:58 PM

## 2021-04-25 NOTE — ED Notes (Signed)
This RN went in to give pt nitro pill. Pt said that she is no longer having pain and does not need medicine. This RN notified pt to let us know if she is having pain again.

## 2021-04-25 NOTE — ED Notes (Signed)
Dr. Hyacinth Meeker paged due to patient having pain on her side with inspiration (pt informed us that cardiologist would like to know when she is having pain). Pt rates pain 6/10. Dr. Hyacinth Meeker informed this RN to give PRN nitro.

## 2021-04-25 NOTE — Addendum Note (Signed)
Addended by: Chalmers Cater on: 04/25/2021 04:54 PM   Modules accepted: Orders

## 2021-04-26 ENCOUNTER — Inpatient Hospital Stay (HOSPITAL_COMMUNITY): Payer: PPO

## 2021-04-26 ENCOUNTER — Encounter (HOSPITAL_COMMUNITY)
Admission: EM | Disposition: A | Discharge status: Home / Self Care | Payer: Self-pay | Source: Home / Self Care | Attending: Cardiology

## 2021-04-26 DIAGNOSIS — E785 Hyperlipidemia, unspecified: Secondary | ICD-10-CM | POA: Diagnosis not present

## 2021-04-26 DIAGNOSIS — I214 Non-ST elevation (NSTEMI) myocardial infarction: Secondary | ICD-10-CM | POA: Diagnosis not present

## 2021-04-26 DIAGNOSIS — I248 Other forms of acute ischemic heart disease: Secondary | ICD-10-CM | POA: Diagnosis not present

## 2021-04-26 DIAGNOSIS — I10 Essential (primary) hypertension: Secondary | ICD-10-CM

## 2021-04-26 DIAGNOSIS — I2510 Atherosclerotic heart disease of native coronary artery without angina pectoris: Secondary | ICD-10-CM | POA: Diagnosis not present

## 2021-04-26 DIAGNOSIS — E1169 Type 2 diabetes mellitus with other specified complication: Secondary | ICD-10-CM

## 2021-04-26 HISTORY — PX: LEFT HEART CATH AND CORONARY ANGIOGRAPHY: CATH118249

## 2021-04-26 LAB — CBC
HCT: 37.2 % (ref 36.0–46.0)
Hemoglobin: 12.4 g/dL (ref 12.0–15.0)
MCH: 29.7 pg (ref 26.0–34.0)
MCHC: 33.3 g/dL (ref 30.0–36.0)
MCV: 89 fL (ref 80.0–100.0)
Platelets: 279 10*3/uL (ref 150–400)
RBC: 4.18 MIL/uL (ref 3.87–5.11)
RDW: 13.9 % (ref 11.5–15.5)
WBC: 9 10*3/uL (ref 4.0–10.5)
nRBC: 0 % (ref 0.0–0.2)

## 2021-04-26 LAB — PROTIME-INR
INR: 0.9 (ref 0.8–1.2)
Prothrombin Time: 12.6 seconds (ref 11.4–15.2)

## 2021-04-26 LAB — LIPID PANEL
Cholesterol: 240 mg/dL — ABNORMAL HIGH (ref 0–200)
HDL: 47 mg/dL (ref >40)
LDL Cholesterol: 143 mg/dL — ABNORMAL HIGH (ref 0–99)
Total CHOL/HDL Ratio: 5.1 RATIO
Triglycerides: 248 mg/dL — ABNORMAL HIGH (ref <150)
VLDL: 50 mg/dL — ABNORMAL HIGH (ref 0–40)

## 2021-04-26 LAB — BASIC METABOLIC PANEL
Anion gap: 9 (ref 5–15)
BUN: 18 mg/dL (ref 8–23)
CO2: 24 mmol/L (ref 22–32)
Calcium: 9.8 mg/dL (ref 8.9–10.3)
Chloride: 104 mmol/L (ref 98–111)
Creatinine, Ser: 1.05 mg/dL — ABNORMAL HIGH (ref 0.44–1.00)
GFR, Estimated: 55 mL/min — ABNORMAL LOW (ref >60)
Glucose, Bld: 139 mg/dL — ABNORMAL HIGH (ref 70–99)
Potassium: 3.9 mmol/L (ref 3.5–5.1)
Sodium: 137 mmol/L (ref 135–145)

## 2021-04-26 LAB — HEPARIN LEVEL (UNFRACTIONATED)
Heparin Unfractionated: 0.28 IU/mL — ABNORMAL LOW (ref 0.30–0.70)
Heparin Unfractionated: 0.36 IU/mL (ref 0.30–0.70)

## 2021-04-26 LAB — GLUCOSE, CAPILLARY
Glucose-Capillary: 151 mg/dL — ABNORMAL HIGH (ref 70–99)
Glucose-Capillary: 158 mg/dL — ABNORMAL HIGH (ref 70–99)

## 2021-04-26 LAB — HEMOGLOBIN A1C
Hgb A1c MFr Bld: 8.8 % — ABNORMAL HIGH (ref 4.8–5.6)
Mean Plasma Glucose: 206 mg/dL

## 2021-04-26 LAB — CBG MONITORING, ED: Glucose-Capillary: 155 mg/dL — ABNORMAL HIGH (ref 70–99)

## 2021-04-26 LAB — ECHOCARDIOGRAM COMPLETE
Area-P 1/2: 2.56 cm2
S' Lateral: 2.9 cm

## 2021-04-26 SURGERY — LEFT HEART CATH AND CORONARY ANGIOGRAPHY
Anesthesia: LOCAL

## 2021-04-26 MED ORDER — FENTANYL CITRATE (PF) 100 MCG/2ML IJ SOLN
INTRAMUSCULAR | Status: AC
  Filled 2021-04-26: qty 2

## 2021-04-26 MED ORDER — SODIUM CHLORIDE 0.9% FLUSH: 3.0000 mL | Freq: Two times a day (BID) | INTRAVENOUS | Status: DC

## 2021-04-26 MED ORDER — HEPARIN SODIUM (PORCINE) 1000 UNIT/ML IJ SOLN
INTRAMUSCULAR | Status: DC | PRN
  Administered 2021-04-26: 5000 [IU] via INTRAVENOUS

## 2021-04-26 MED ORDER — LABETALOL HCL 5 MG/ML IV SOLN: 10.0000 mg | INTRAVENOUS | Status: AC | PRN

## 2021-04-26 MED ORDER — SODIUM CHLORIDE 0.9 % WEIGHT BASED INFUSION
3.0000 mL/kg/h | INTRAVENOUS | Status: AC
  Administered 2021-04-26: 3 mL/kg/h via INTRAVENOUS

## 2021-04-26 MED ORDER — LIDOCAINE HCL (PF) 1 % IJ SOLN
INTRAMUSCULAR | Status: DC | PRN
  Administered 2021-04-26: 2 mL via SUBCUTANEOUS

## 2021-04-26 MED ORDER — VERAPAMIL HCL 2.5 MG/ML IV SOLN
INTRAVENOUS | Status: DC | PRN
  Administered 2021-04-26: 10 mL via INTRA_ARTERIAL

## 2021-04-26 MED ORDER — SODIUM CHLORIDE 0.9 % IV SOLN: 250.0000 mL | INTRAVENOUS | Status: DC | PRN

## 2021-04-26 MED ORDER — HEPARIN (PORCINE) IN NACL 1000-0.9 UT/500ML-% IV SOLN
INTRAVENOUS | Status: DC | PRN
  Administered 2021-04-26 (×3): 500 mL

## 2021-04-26 MED ORDER — MIDAZOLAM HCL 2 MG/2ML IJ SOLN
INTRAMUSCULAR | Status: DC | PRN
  Administered 2021-04-26: 2 mg via INTRAVENOUS

## 2021-04-26 MED ORDER — LIDOCAINE HCL (PF) 1 % IJ SOLN
INTRAMUSCULAR | Status: AC
  Filled 2021-04-26: qty 30

## 2021-04-26 MED ORDER — HEPARIN SODIUM (PORCINE) 1000 UNIT/ML IJ SOLN
INTRAMUSCULAR | Status: AC
  Filled 2021-04-26: qty 1

## 2021-04-26 MED ORDER — APIXABAN 5 MG PO TABS
5.0000 mg | ORAL_TABLET | Freq: Two times a day (BID) | ORAL | Status: DC
  Administered 2021-04-27: 5 mg via ORAL
  Filled 2021-04-26: qty 1

## 2021-04-26 MED ORDER — VERAPAMIL HCL 2.5 MG/ML IV SOLN
INTRAVENOUS | Status: AC
  Filled 2021-04-26: qty 2

## 2021-04-26 MED ORDER — HYDRALAZINE HCL 20 MG/ML IJ SOLN: 10.0000 mg | INTRAMUSCULAR | Status: AC | PRN

## 2021-04-26 MED ORDER — HEPARIN (PORCINE) IN NACL 1000-0.9 UT/500ML-% IV SOLN
INTRAVENOUS | Status: AC
  Filled 2021-04-26: qty 1000

## 2021-04-26 MED ORDER — FENTANYL CITRATE (PF) 100 MCG/2ML IJ SOLN
INTRAMUSCULAR | Status: DC | PRN
  Administered 2021-04-26: 25 ug via INTRAVENOUS

## 2021-04-26 MED ORDER — SODIUM CHLORIDE 0.9% FLUSH: 3.0000 mL | INTRAVENOUS | Status: DC | PRN

## 2021-04-26 MED ORDER — SODIUM CHLORIDE 0.9 % WEIGHT BASED INFUSION
1.0000 mL/kg/h | INTRAVENOUS | Status: AC
  Administered 2021-04-26: 1 mL/kg/h via INTRAVENOUS

## 2021-04-26 MED ORDER — SODIUM CHLORIDE 0.9 % WEIGHT BASED INFUSION
1.0000 mL/kg/h | INTRAVENOUS | Status: DC
  Administered 2021-04-26: 1 mL/kg/h via INTRAVENOUS

## 2021-04-26 MED ORDER — MIDAZOLAM HCL 2 MG/2ML IJ SOLN
INTRAMUSCULAR | Status: AC
  Filled 2021-04-26: qty 2

## 2021-04-26 MED ORDER — IOHEXOL 350 MG/ML SOLN
INTRAVENOUS | Status: DC | PRN
  Administered 2021-04-26: 30 mL via INTRA_ARTERIAL

## 2021-04-26 SURGICAL SUPPLY — 9 items
CATH 5FR JL3.5 JR4 ANG PIG MP (CATHETERS) ×2 IMPLANT
DEVICE RAD COMP TR BAND LRG (VASCULAR PRODUCTS) ×2 IMPLANT
GLIDESHEATH SLEND SS 6F .021 (SHEATH) ×2 IMPLANT
GUIDEWIRE INQWIRE 1.5J.035X260 (WIRE) ×1 IMPLANT
INQWIRE 1.5J .035X260CM (WIRE) ×2
KIT HEART LEFT (KITS) ×2 IMPLANT
PACK CARDIAC CATHETERIZATION (CUSTOM PROCEDURE TRAY) ×2 IMPLANT
TRANSDUCER W/STOPCOCK (MISCELLANEOUS) ×2 IMPLANT
TUBING CIL FLEX 10 FLL-RA (TUBING) ×2 IMPLANT

## 2021-04-26 NOTE — Interval H&P Note (Signed)
Cath Lab Visit (complete for each Cath Lab visit)  Clinical Evaluation Leading to the Procedure:   ACS: Yes.    Non-ACS:    Anginal Classification: CCS IV  Anti-ischemic medical therapy: Minimal Therapy (1 class of medications)  Non-Invasive Test Results: No non-invasive testing performed  Prior CABG: No previous CABG  History and Physical Interval Note:  04/26/2021 5:39 PM  Tara Campbell  has presented today for surgery, with the diagnosis of chest pain.  The various methods of treatment have been discussed with the patient and family. After consideration of risks, benefits and other options for treatment, the patient has consented to  Procedure(s): LEFT HEART CATH AND CORONARY ANGIOGRAPHY (N/A) as a surgical intervention.  The patient's history has been reviewed, patient examined, no change in status, stable for surgery.  I have reviewed the patient's chart and labs.  Questions were answered to the patient's satisfaction.     Tonny Bollman

## 2021-04-26 NOTE — Progress Notes (Signed)
ANTICOAGULATION CONSULT NOTE  Pharmacy Consult for heparin Indication: chest pain/ACS  Allergies  Allergen Reactions  . Latex   . Sulfa Antibiotics Rash    Patient Measurements:   Heparin Dosing Weight: 74.4 kg   Vital Signs: Temp: 98.3 F (36.8 C) (05/24 1934) Temp Source: Oral (05/24 1934) BP: 123/64 (05/25 0445) Pulse Rate: 63 (05/25 0445)  Labs: Recent Labs    04/24/21 0000 04/25/21 1808 04/25/21 2008 04/26/21 0448 04/26/21 0449  HGB 13.2 12.6  --  12.4  --   HCT 39.9 37.5  --  37.2  --   PLT 355 305  --  279  --   LABPROT  --   --   --  12.6  --   INR  --   --   --  0.9  --   HEPARINUNFRC  --   --   --   --  0.28*  CREATININE 1.15* 1.26*  --   --   --   TROPONINIHS  --  379* 415*  --   --     Estimated Creatinine Clearance: 40.3 mL/min (A) (by C-G formula based on SCr of 1.26 mg/dL (H)).   Assessment: 3 YOF with h/o Afib who presented with chest pain concerning for ACS. Pharmacy consulted to start IV heparin. Of note, patient states her last dose of Eliquis was on Sunday.   Heparin level slightly subtherapeutic (0.28) on gtt at 950 units/hr. No issues with line or bleeding reported per RN.  Goal of Therapy:  Heparin level 0.3-0.7 units/ml Monitor platelets by anticoagulation protocol: Yes   Plan:  Increase heparin to 1100 units/hr F/u 8 hr HL  Christoper Fabian, PharmD, BCPS Please see amion for complete clinical pharmacist phone list

## 2021-04-26 NOTE — ED Notes (Signed)
Pt ambulatory to restroom and back to bed. 

## 2021-04-26 NOTE — Progress Notes (Signed)
  Echocardiogram 2D Echocardiogram has been performed.  Tara Campbell F 04/26/2021, 12:03 PM

## 2021-04-26 NOTE — Progress Notes (Signed)
ANTICOAGULATION CONSULT NOTE  Pharmacy Consult for IV Heparin Indication: chest pain/ACS  Allergies  Allergen Reactions  . Latex Hives  . Sulfa Antibiotics Rash    Patient Measurements: Total Body Weight: 88.5 kg Height: 64 inches Heparin Dosing Weight: 74.4 kg   Vital Signs: Temp: 97.3 F (36.3 C) (05/25 1000) Temp Source: Oral (05/25 1000) BP: 110/74 (05/25 1000) Pulse Rate: 80 (05/25 1000)  Labs: Recent Labs    04/24/21 0000 04/25/21 1808 04/25/21 2008 04/26/21 0448 04/26/21 0449 04/26/21 1403  HGB 13.2 12.6  --  12.4  --   --   HCT 39.9 37.5  --  37.2  --   --   PLT 355 305  --  279  --   --   LABPROT  --   --   --  12.6  --   --   INR  --   --   --  0.9  --   --   HEPARINUNFRC  --   --   --   --  0.28* 0.36  CREATININE 1.15* 1.26*  --  1.05*  --   --   TROPONINIHS  --  379* 415*  --   --   --     Estimated Creatinine Clearance: 48.3 mL/min (A) (by C-G formula based on SCr of 1.05 mg/dL (H)).   Assessment: 75 yr old woman with hx of atrial fibrillation that presented with chest pain concerning for ACS. Pharmacy was consulted to start IV heparin. Pt was on apixaban PTA; patient states that her last dose of apixaban was on Sunday.   Heparin level ~8 hrs after heparin infusion was increased to 1100 units/hr was 0.36 units/ml, which is within the goal range for this pt. H/H 12.4/37.2, plt 279. Per RN, no issues with IV or bleeding observed.  Cath planned for today, 5/25  Goal of Therapy:  Heparin level 0.3-0.7 units/ml Monitor platelets by anticoagulation protocol: Yes   Plan:  Continue heparin infusion at 1100 units/hr Check confirmatory heparin level in 8 hrs Monitor daily heparin level, CBC Monitor for bleeding F/U after cath  Vicki Mallet, PharmD, BCPS, [facility] Clinical Pharmacist

## 2021-04-26 NOTE — Progress Notes (Addendum)
Progress Note  Patient Name: Tara Campbell Date of Encounter: 04/26/2021  [facility] Cardiologist: None   Subjective   No chest pain this morning. Planned for cath today.   Inpatient Medications    Scheduled Meds: . allopurinol  300 mg Oral BID  . aspirin EC  81 mg Oral Daily  . atorvastatin  80 mg Oral QHS  . carvedilol  3.125 mg Oral BID WC  . fluticasone  1 spray Each Nare Daily  . fluticasone furoate-vilanterol  1 puff Inhalation Daily  . irbesartan  300 mg Oral Daily   And  . hydrochlorothiazide  12.5 mg Oral Daily  . insulin aspart  0-9 Units Subcutaneous TID WC  . levothyroxine  112 mcg Oral QAC breakfast  . loratadine  10 mg Oral QPM  . montelukast  10 mg Oral QHS  . umeclidinium bromide  1 puff Inhalation Daily   Continuous Infusions: . heparin 1,100 Units/hr (04/26/21 0811)   PRN Meds: acetaminophen, albuterol, nitroGLYCERIN, ondansetron (ZOFRAN) IV   Vital Signs    Vitals:   04/26/21 0915 04/26/21 0917 04/26/21 0945 04/26/21 1000  BP: (!) 122/95 (!) 122/95 132/78 110/74  Pulse: 74 76 76 80  Resp: (!) 21  16 20   Temp:    (!) 97.3 F (36.3 C)  TempSrc:    Oral  SpO2: 96%  99% 97%   No intake or output data in the 24 hours ending 04/26/21 1055 Last 3 Weights 04/24/2021 04/06/2021 04/03/2021  Weight (lbs) 195 lb 195 lb 0.6 oz 194 lb  Weight (kg) 88.451 kg 88.47 kg 87.998 kg      Telemetry    NSR --> 70s - Personally Reviewed  ECG    No new tracing this morning  Physical Exam   GEN: No acute distress.   Neck: No JVD Cardiac: RRR, no murmurs, rubs, or gallops.  Respiratory: Clear to auscultation bilaterally. GI: Soft, nontender, non-distended  MS: No edema; No deformity. Neuro:  Nonfocal  Psych: Normal affect   Labs    High Sensitivity Troponin:   Recent Labs  Lab 04/25/21 1808 04/25/21 2008  TROPONINIHS 379* 415*      Chemistry Recent Labs  Lab 04/24/21 0000 04/25/21 1808 04/26/21 0448  NA 138 135 137  K 4.3 4.1 3.9   CL 102 99 104  CO2 29 26 24   GLUCOSE 188* 235* 139*  BUN 17 19 18   CREATININE 1.15* 1.26* 1.05*  CALCIUM 10.9* 9.8 9.8  PROT 7.1  --   --   AST 19  --   --   ALT 17  --   --   BILITOT 0.4  --   --   GFRNONAA 46* 44* 55*  GFRAA 53*  --   --   ANIONGAP  --  10 9     Hematology Recent Labs  Lab 04/24/21 0000 04/25/21 1808 04/26/21 0448  WBC 10.3 8.8 9.0  RBC 4.54 4.24 4.18  HGB 13.2 12.6 12.4  HCT 39.9 37.5 37.2  MCV 87.9 88.4 89.0  MCH 29.1 29.7 29.7  MCHC 33.1 33.6 33.3  RDW 13.3 13.9 13.9  PLT 355 305 279    BNPNo results for input(s): BNP, PROBNP in the last 168 hours.   DDimer No results for input(s): DDIMER in the last 168 hours.   Radiology    DG Chest 2 View  Result Date: 04/25/2021 CLINICAL DATA:  Chest pain EXAM: CHEST - 2 VIEW COMPARISON:  08/16/2020 FINDINGS: Lungs are clear.  No pneumothorax or pleural effusion. Cardiac size within normal limits. Pulmonary vascularity is normal. Degenerative changes are seen within the thoracic spine and left shoulder. No acute bone abnormality. IMPRESSION: No active cardiopulmonary disease. Electronically Signed   By: Ashesh  Parikh MD   On: 04/25/2021 20:23    Cardiac Studies   N/a -> there is RCA PCI pending today.  Patient Profile     75 y.o. female  with HTN, HLD, CAD (prior PCI to RCA), pAfib, DM who is being seen 04/25/2021 for the evaluation of chest pain and NSTEMI.  Assessment & Plan    1.  Non-STEMI with history of CAD: Troponin peaked at 1021 (at PCP office), now downtrending in the 300s.  No further chest pain since admission. -- She remains on IV heparin -- Plan for cardiac catheterization today, last dose of Eliquis was Sunday -- Continue aspirin, atorvastatin 80 mg, Coreg 3.125 mg twice daily and ARB -- Echo pending (my preliminary evaluation, EF seems to be preserved, cannot exclude apical septal hypokinesis, but images appear to be somewhat foreshortened.) -- The patient understands that risks  included but are not limited to stroke (1 in 1000), death (1 in 1000), kidney failure [usually temporary] (1 in 500), bleeding (1 in 200), allergic reaction [possibly serious] (1 in 200).   2.  Hyperlipidemia: On Lipitor 80 mg PTA -- She reports being compliant, but LDL noted at 143.  States she has had trouble controlling her cholesterol. -- Suspect will be a good candidate for PCSK9's  3.  Hypertension: Switch to carvedilol 3.125 mg twice daily on admission, continued on ARB/thiazide combo  4.  Paroxysmal atrial fibrillation: Noted in the postop setting years ago.  Has not had recurrence. -- On Eliquis as an outpatient, with last dose being on Sunday -- Currently on IV heparin in anticipation of cath  5.  Diabetes: Home antihyperglycemic agents on hold.  Hemoglobin A1c pending -- On sliding scale insulin -- May be a good candidate for SGLT2 pending cost -agreed  For questions or updates, please contact [facility] Please consult [email or web address] for contact info under        Signed, Lindsay Roberts, NP  04/26/2021, 10:55 AM     ATTENDING ATTESTATION  I have seen, examined and evaluated the patient this M along with Lindsay Roberts, NP-C.  After reviewing all the available data and chart, we discussed the patients laboratory, study & physical findings as well as symptoms in detail. I agree with her findings, examination as well as impression recommendations as per our discussion.    Attending adjustments noted in italics.   Stable.  No further chest pain.  Troponin peaked at roughly 1000.  Is on stable regimen as noted above.   Plan for cardiac catheterization today.   Further plans based on results.     Rehaan Viloria, M.D., M.S. Interventional Cardiologist   Pager # [phone] Phone # [phone] [address] [place], [place] [address]    

## 2021-04-26 NOTE — H&P (View-Only) (Signed)
Progress Note  Patient Name: Tara Campbell Date of Encounter: 04/26/2021  [facility] Cardiologist: None   Subjective   No chest pain this morning. Planned for cath today.   Inpatient Medications    Scheduled Meds: . allopurinol  300 mg Oral BID  . aspirin EC  81 mg Oral Daily  . atorvastatin  80 mg Oral QHS  . carvedilol  3.125 mg Oral BID WC  . fluticasone  1 spray Each Nare Daily  . fluticasone furoate-vilanterol  1 puff Inhalation Daily  . irbesartan  300 mg Oral Daily   And  . hydrochlorothiazide  12.5 mg Oral Daily  . insulin aspart  0-9 Units Subcutaneous TID WC  . levothyroxine  112 mcg Oral QAC breakfast  . loratadine  10 mg Oral QPM  . montelukast  10 mg Oral QHS  . umeclidinium bromide  1 puff Inhalation Daily   Continuous Infusions: . heparin 1,100 Units/hr (04/26/21 0811)   PRN Meds: acetaminophen, albuterol, nitroGLYCERIN, ondansetron (ZOFRAN) IV   Vital Signs    Vitals:   04/26/21 0915 04/26/21 0917 04/26/21 0945 04/26/21 1000  BP: (!) 122/95 (!) 122/95 132/78 110/74  Pulse: 74 76 76 80  Resp: (!) 21  16 20   Temp:    (!) 97.3 F (36.3 C)  TempSrc:    Oral  SpO2: 96%  99% 97%   No intake or output data in the 24 hours ending 04/26/21 1055 Last 3 Weights 04/24/2021 04/06/2021 04/03/2021  Weight (lbs) 195 lb 195 lb 0.6 oz 194 lb  Weight (kg) 88.451 kg 88.47 kg 87.998 kg      Telemetry    NSR --> 70s - Personally Reviewed  ECG    No new tracing this morning  Physical Exam   GEN: No acute distress.   Neck: No JVD Cardiac: RRR, no murmurs, rubs, or gallops.  Respiratory: Clear to auscultation bilaterally. GI: Soft, nontender, non-distended  MS: No edema; No deformity. Neuro:  Nonfocal  Psych: Normal affect   Labs    High Sensitivity Troponin:   Recent Labs  Lab 04/25/21 1808 04/25/21 2008  TROPONINIHS 379* 415*      Chemistry Recent Labs  Lab 04/24/21 0000 04/25/21 1808 04/26/21 0448  NA 138 135 137  K 4.3 4.1 3.9   CL 102 99 104  CO2 29 26 24   GLUCOSE 188* 235* 139*  BUN 17 19 18   CREATININE 1.15* 1.26* 1.05*  CALCIUM 10.9* 9.8 9.8  PROT 7.1  --   --   AST 19  --   --   ALT 17  --   --   BILITOT 0.4  --   --   GFRNONAA 46* 44* 55*  GFRAA 53*  --   --   ANIONGAP  --  10 9     Hematology Recent Labs  Lab 04/24/21 0000 04/25/21 1808 04/26/21 0448  WBC 10.3 8.8 9.0  RBC 4.54 4.24 4.18  HGB 13.2 12.6 12.4  HCT 39.9 37.5 37.2  MCV 87.9 88.4 89.0  MCH 29.1 29.7 29.7  MCHC 33.1 33.6 33.3  RDW 13.3 13.9 13.9  PLT 355 305 279    BNPNo results for input(s): BNP, PROBNP in the last 168 hours.   DDimer No results for input(s): DDIMER in the last 168 hours.   Radiology    DG Chest 2 View  Result Date: 04/25/2021 CLINICAL DATA:  Chest pain EXAM: CHEST - 2 VIEW COMPARISON:  08/16/2020 FINDINGS: Lungs are clear.  No pneumothorax or pleural effusion. Cardiac size within normal limits. Pulmonary vascularity is normal. Degenerative changes are seen within the thoracic spine and left shoulder. No acute bone abnormality. IMPRESSION: No active cardiopulmonary disease. Electronically Signed   By: Helyn Numbers MD   On: 04/25/2021 20:23    Cardiac Studies   N/a -> there is RCA PCI pending today.  Patient Profile     75 y.o. female  with HTN, HLD, CAD (prior PCI to RCA), pAfib, DM who is being seen 04/25/2021 for the evaluation of chest pain and NSTEMI.  Assessment & Plan    1.  Non-STEMI with history of CAD: Troponin peaked at 1021 (at PCP office), now downtrending in the 300s.  No further chest pain since admission. -- She remains on IV heparin -- Plan for cardiac catheterization today, last dose of Eliquis was Sunday -- Continue aspirin, atorvastatin 80 mg, Coreg 3.125 mg twice daily and ARB -- Echo pending (my preliminary evaluation, EF seems to be preserved, cannot exclude apical septal hypokinesis, but images appear to be somewhat foreshortened.) -- The patient understands that risks  included but are not limited to stroke (1 in 1000), death (1 in 1000), kidney failure [usually temporary] (1 in 500), bleeding (1 in 200), allergic reaction [possibly serious] (1 in 200).   2.  Hyperlipidemia: On Lipitor 80 mg PTA -- She reports being compliant, but LDL noted at 143.  States she has had trouble controlling her cholesterol. -- Suspect will be a good candidate for PCSK9's  3.  Hypertension: Switch to carvedilol 3.125 mg twice daily on admission, continued on ARB/thiazide combo  4.  Paroxysmal atrial fibrillation: Noted in the postop setting years ago.  Has not had recurrence. -- On Eliquis as an outpatient, with last dose being on Sunday -- Currently on IV heparin in anticipation of cath  5.  Diabetes: Home antihyperglycemic agents on hold.  Hemoglobin A1c pending -- On sliding scale insulin -- May be a good candidate for SGLT2 pending cost -agreed  For questions or updates, please contact [facility] Please consult [email or web address] for contact info under        Signed, Laverda Page, NP  04/26/2021, 10:55 AM     ATTENDING ATTESTATION  I have seen, examined and evaluated the patient this M along with Laverda Page, NP-C.  After reviewing all the available data and chart, we discussed the patients laboratory, study & physical findings as well as symptoms in detail. I agree with her findings, examination as well as impression recommendations as per our discussion.    Attending adjustments noted in italics.   Stable.  No further chest pain.  Troponin peaked at roughly 1000.  Is on stable regimen as noted above.   Plan for cardiac catheterization today.   Further plans based on results.     Bryan Lemma, M.D., M.S. Interventional Cardiologist   Pager # [phone] Phone # [phone] [address] [place], [place] [address]

## 2021-04-27 ENCOUNTER — Other Ambulatory Visit (HOSPITAL_COMMUNITY): Payer: Self-pay

## 2021-04-27 ENCOUNTER — Encounter (HOSPITAL_COMMUNITY): Payer: Self-pay | Admitting: Cardiovascular Disease

## 2021-04-27 DIAGNOSIS — N1831 Chronic kidney disease, stage 3a: Secondary | ICD-10-CM

## 2021-04-27 DIAGNOSIS — I25110 Atherosclerotic heart disease of native coronary artery with unstable angina pectoris: Secondary | ICD-10-CM

## 2021-04-27 DIAGNOSIS — I480 Paroxysmal atrial fibrillation: Secondary | ICD-10-CM

## 2021-04-27 DIAGNOSIS — E785 Hyperlipidemia, unspecified: Secondary | ICD-10-CM

## 2021-04-27 DIAGNOSIS — I214 Non-ST elevation (NSTEMI) myocardial infarction: Secondary | ICD-10-CM | POA: Diagnosis not present

## 2021-04-27 DIAGNOSIS — E1122 Type 2 diabetes mellitus with diabetic chronic kidney disease: Secondary | ICD-10-CM | POA: Diagnosis not present

## 2021-04-27 DIAGNOSIS — I10 Essential (primary) hypertension: Secondary | ICD-10-CM | POA: Diagnosis not present

## 2021-04-27 LAB — BASIC METABOLIC PANEL
Anion gap: 9 (ref 5–15)
BUN: 16 mg/dL (ref 8–23)
CO2: 23 mmol/L (ref 22–32)
Calcium: 9.3 mg/dL (ref 8.9–10.3)
Chloride: 103 mmol/L (ref 98–111)
Creatinine, Ser: 1.11 mg/dL — ABNORMAL HIGH (ref 0.44–1.00)
GFR, Estimated: 51 mL/min — ABNORMAL LOW (ref >60)
Glucose, Bld: 208 mg/dL — ABNORMAL HIGH (ref 70–99)
Potassium: 4.2 mmol/L (ref 3.5–5.1)
Sodium: 135 mmol/L (ref 135–145)

## 2021-04-27 LAB — GLUCOSE, CAPILLARY
Glucose-Capillary: 143 mg/dL — ABNORMAL HIGH (ref 70–99)
Glucose-Capillary: 143 mg/dL — ABNORMAL HIGH (ref 70–99)
Glucose-Capillary: 198 mg/dL — ABNORMAL HIGH (ref 70–99)
Glucose-Capillary: 109 mg/dL — ABNORMAL HIGH (ref 70–99)

## 2021-04-27 LAB — CBC
HCT: 32.4 % — ABNORMAL LOW (ref 36.0–46.0)
Hemoglobin: 10.9 g/dL — ABNORMAL LOW (ref 12.0–15.0)
MCH: 29.5 pg (ref 26.0–34.0)
MCHC: 33.6 g/dL (ref 30.0–36.0)
MCV: 87.6 fL (ref 80.0–100.0)
Platelets: 265 10*3/uL (ref 150–400)
RBC: 3.7 MIL/uL — ABNORMAL LOW (ref 3.87–5.11)
RDW: 13.8 % (ref 11.5–15.5)
WBC: 7.2 10*3/uL (ref 4.0–10.5)
nRBC: 0 % (ref 0.0–0.2)

## 2021-04-27 MED ORDER — EZETIMIBE 10 MG PO TABS
10.0000 mg | ORAL_TABLET | Freq: Every day | ORAL | 0 refills | Status: DC
  Filled 2021-04-27: qty 90, 90d supply, fill #0

## 2021-04-27 MED ORDER — CLOPIDOGREL BISULFATE 75 MG PO TABS
75.0000 mg | ORAL_TABLET | Freq: Every day | ORAL | Status: DC
  Administered 2021-04-27: 75 mg via ORAL
  Filled 2021-04-27: qty 1

## 2021-04-27 MED ORDER — EZETIMIBE 10 MG PO TABS
10.0000 mg | ORAL_TABLET | Freq: Every day | ORAL | Status: DC
  Administered 2021-04-27: 10 mg via ORAL
  Filled 2021-04-27: qty 1

## 2021-04-27 MED ORDER — CLOPIDOGREL BISULFATE 75 MG PO TABS
75.0000 mg | ORAL_TABLET | Freq: Every day | ORAL | 1 refills | Status: DC
  Filled 2021-04-27: qty 90, 90d supply, fill #0

## 2021-04-27 NOTE — Discharge Summary (Addendum)
Discharge Summary    Patient ID: Tara Campbell MRN: [id number]; DOB: 18-May-1946  Admit date: 04/25/2021 Discharge date: 04/27/2021  PCP:  Hali Marry, MD   [facility] Providers Cardiologist:  Kirk Ruths, MD        Discharge Diagnoses    Principal Problem:   NSTEMI (non-ST elevated myocardial infarction) [facility]) Active Problems:   Paroxysmal atrial fibrillation ([place])   Hypertension goal BP (blood pressure) < 140/90   Type 2 diabetes mellitus with diabetic chronic kidney disease ([place])   Hyperlipidemia  Diagnostic Studies/Procedures    Cath: 04/26/21  1.  Severe complex stenosis of the LAD in the proximal, mid, and distal vessel. 2.  Patent left circumflex no significant stenosis 3.  Patent left main with no significant stenosis 4.  Patent RCA with patent RCA stents, mild nonobstructive disease present with less than 50% stenosis 5.  Normal LVEDP  Recommendations: The patient has severe diffuse LAD stenoses.  The vessel is unfavorable for PCI with heavy calcification, marked tortuosity, and small vessel caliber.  I would favor medical therapy as an initial approach.  If she fails medical therapy would be reasonable to attempt PCI of the proximal and mid vessel with technical concerns noted above.  Diagnostic Dominance: Right   Echo: 04/26/21  IMPRESSIONS    1. Left ventricular ejection fraction, by estimation, is 60 to 65%. The  left ventricle has normal function. The left ventricle has no regional  wall motion abnormalities. There is mild concentric left ventricular  hypertrophy. Left ventricular diastolic  parameters are consistent with Grade I diastolic dysfunction (impaired  relaxation). Elevated left atrial pressure.  2. Right ventricular systolic function is normal. The right ventricular  size is normal.  3. Left atrial size was moderately dilated.  4. The mitral valve is normal in structure. No evidence of mitral valve  regurgitation. No  evidence of mitral stenosis.  5. The aortic valve is normal in structure. Aortic valve regurgitation is  not visualized. No aortic stenosis is present.  6. The inferior vena cava is normal in size with greater than 50%  respiratory variability, suggesting right atrial pressure of 3 mmHg.   Comparison(s): No significant change from prior study. Prior images  reviewed side by side.  _____________   History of Present Illness     Tara Campbell is a 75 y.o. female with with CAD (prior PCI to RCA), pAfib on apixaban, HTN, HLD, T2DM, severe persistent Asthma who presented 04/25/21 to the emergency department after being found on outpatient laboratory evaluation to have an elevated troponin to the 1000's.  Noted that she developed over the last couple of days burning chest pressure in her central chest.  Noted that it was worse after eating and occurred several times.  Noticed some nausea and had one episode of vomiting.  She wanted to be evaluated by her PCP where she was sent for labs and EKG.  Nonspecific T wave inversion.  On her outpatient labs her CBC was unremarkable with a hemoglobin 13.2 and a white count of 10.3.  Her creatinine was 1.15, troponin was 1021.  On presentation to the emergency department she was hemodynamically stable and notably not having active chest pain at this time.  Has had some intermittent episodes throughout the last 24 to 48 hours.  Repeat labs stable with a creatinine of 1.26 and a troponin of 379.  Cardiology was called for evaluation due to elevated troponin that is downtrending. She is a nonsmoker.  Last  lipid panel in April 2022 with Total Chol 266 and LDL 165. A1c 8.7% (has been difficult to control several years)  Given findings she was admitted with plans to undergo cardiac cath.   Hospital Course      1.  Non-STEMI with history of CAD: Troponin peaked at 1021 (at PCP office), then downtrending in the 300s.  Underwent cardiac catheterization noted  above with severe complex stenosis of the LAD in the proximal, mid and distal vessel.  It was felt LAD was unfavorable for PCI given heavy calcification, tortuosity and small caliber vessel.  Recommendation for medical therapy as initial approach but if fails medical therapy would be reasonable to attempt PCI of the proximal and mid vessel. --Placed on Plavix and Eliquis resumed post cath --Continue atorvastatin 80 mg, atenolol 100 mg daily and ARB/HCTZ -- Echo showed an EF of 60 to 65% with no regional wall motion abnormality, grade 1 diastolic dysfunction, mildly dilated LA.  -- Seen by cardiac rehab and referral made prior to discharge.  2.  Hyperlipidemia: On Lipitor 80 mg PTA -- She reports being compliant, but LDL noted at 143.  States she has had trouble controlling her cholesterol.  Further interview reports she had stopped her statin for about 8 months and was recently resumed back at the beginning of April. -- Suspect will be a good candidate for PCSK9's.  We will start Zetia 10 mg daily and refer to lipid clinic  3.  Hypertension:  Will continue home medication regimen of atenolol 100 mg daily, ARB/HCTZ combo -- instructed to start back with 1/2 dose of ARB/HCTZ then BP ok, could resume full dose  4.  Paroxysmal atrial fibrillation: Noted in the postop setting years ago.  Has not had recurrence. -- Eliquis 5 mg twice daily   5.  Diabetes: Home antihyperglycemic agents on hold.  Hemoglobin A1c 8.8 --  Resumed on metformin 1000 mg twice daily along with Ozempic -- Consider SGLT2 as an outpatient, though she has had a lot of trouble with cost and high co-pays with her asthma inhalers recently. -- Follow-up with PCP regarding ongoing management  6.  Asthma: Followed by pulmonology as an outpatient -- Continue home medication regimen at discharge  7.  Hypothyroidism: Continue Synthroid 112 mcg daily  General: Well developed, well nourished, female appearing in no acute  distress. Head: Normocephalic, atraumatic.  Neck: Supple without bruits, JVD. Lungs:  Resp regular and unlabored, CTA. Heart: RRR, S1, S2, no S3, S4, or murmur; no rub. Abdomen: Soft, non-tender, non-distended with normoactive bowel sounds. No hepatomegaly. No rebound/guarding. No obvious abdominal masses. Extremities: No clubbing, cyanosis, edema. Distal pedal pulses are 2+ bilaterally. Right radial cath site stable without bruising or hematoma Neuro: Alert and oriented X 3. Moves all extremities spontaneously. Psych: Normal affect.  Patient was seen by Dr. Ellyn Hack and determined stable for discharge home. Follow up in the office has been arranged. Medications sent to [place]. Educated by PharmD prior to discharge.   Did the patient have an acute coronary syndrome (MI, NSTEMI, STEMI, etc) this admission?:  Yes                               AHA/ACC Clinical Performance & Quality Measures: 1. Aspirin prescribed? - No - no intervention, placed on plavix/Eliquis 2. ADP Receptor Inhibitor (Plavix/Clopidogrel, Brilinta/Ticagrelor or Effient/Prasugrel) prescribed (includes medically managed patients)? - Yes 3. Beta Blocker prescribed? - Yes 4. High Intensity  Statin (Lipitor 40-57m or Crestor 20-472m prescribed? - Yes 5. EF assessed during THIS hospitalization? - Yes 6. For EF <40%, was ACEI/ARB prescribed? - Not Applicable (EF >/= 4091%7. For EF <40%, Aldosterone Antagonist (Spironolactone or Eplerenone) prescribed? - Not Applicable (EF >/= 4066%8. Cardiac Rehab Phase II ordered (including medically managed patients)? - Yes    _____________  Discharge Vitals Blood pressure (!) 106/49, pulse 84, temperature 97.6 F (36.4 C), temperature source Oral, resp. rate 17, weight 86.7 kg, SpO2 98 %.  Filed Weights   04/27/21 0545  Weight: 86.7 kg    Labs & Radiologic Studies    CBC Recent Labs    04/26/21 0448 04/27/21 0114  WBC 9.0 7.2  HGB 12.4 10.9*  HCT 37.2 32.4*  MCV 89.0  87.6  PLT 279 26060 Basic Metabolic Panel Recent Labs    04/26/21 0448 04/27/21 0844  NA 137 135  K 3.9 4.2  CL 104 103  CO2 24 23  GLUCOSE 139* 208*  BUN 18 16  CREATININE 1.05* 1.11*  CALCIUM 9.8 9.3   Liver Function Tests No results for input(s): AST, ALT, ALKPHOS, BILITOT, PROT, ALBUMIN in the last 72 hours. No results for input(s): LIPASE, AMYLASE in the last 72 hours. High Sensitivity Troponin:   Recent Labs  Lab 04/25/21 1808 04/25/21 2008  TROPONINIHS 379* 415*    BNP Invalid input(s): POCBNP D-Dimer No results for input(s): DDIMER in the last 72 hours. Hemoglobin A1C Recent Labs    04/26/21 0449  HGBA1C 8.8*   Fasting Lipid Panel Recent Labs    04/26/21 0449  CHOL 240*  HDL 47  LDLCALC 143*  TRIG 248*  CHOLHDL 5.1   Thyroid Function Tests No results for input(s): TSH, T4TOTAL, T3FREE, THYROIDAB in the last 72 hours.  Invalid input(s): FREET3 _____________  DG Chest 2 View  Result Date: 04/25/2021 CLINICAL DATA:  Chest pain EXAM: CHEST - 2 VIEW COMPARISON:  08/16/2020 FINDINGS: Lungs are clear. No pneumothorax or pleural effusion. Cardiac size within normal limits. Pulmonary vascularity is normal. Degenerative changes are seen within the thoracic spine and left shoulder. No acute bone abnormality. IMPRESSION: No active cardiopulmonary disease. Electronically Signed   By: AsFidela SalisburyD   On: 04/25/2021 20:23   CARDIAC CATHETERIZATION  04/26/2021  1.  Severe complex stenosis of the LAD in the proximal, mid, and distal vessel. 2.  Patent left circumflex no significant stenosis 3.  Patent left main with no significant stenosis 4.  Patent RCA with patent RCA stents, mild nonobstructive disease present with less than 50% stenosis 5.  Normal LVEDP Recommendations: The patient has severe diffuse LAD stenoses.  The vessel is unfavorable for PCI with heavy calcification, marked tortuosity, and small vessel caliber.  I would favor medical therapy as an  initial approach.  If she fails medical therapy would be reasonable to attempt PCI of the proximal and mid vessel with technical concerns noted above.  ECHOCARDIOGRAM COMPLETE 04/26/2021 - see above:   Disposition   Pt is being discharged home today in good condition.  Follow-up Plans & Appointments     Follow-up Information    ClDeberah PeltonNP Follow up on 05/16/2021.   Specialty: Cardiology Why: at 9:15am for your follow up appt.  Contact information: [address] [phone]              Discharge Instructions    [facility]   Complete by:  As directed    Possible PCSK9 start   Reason For Referral: Lipids   Amb Referral to Cardiac Rehabilitation   Complete by: As directed    Diagnosis: NSTEMI   After initial evaluation and assessments completed: Virtual Based Care may be provided alone or in conjunction with Phase 2 Cardiac Rehab based on patient barriers.: Yes   Call MD for:  difficulty breathing, headache or visual disturbances   Complete by: As directed    Call MD for:  persistant dizziness or light-headedness   Complete by: As directed    Call MD for:  redness, tenderness, or signs of infection (pain, swelling, redness, odor or green/yellow discharge around incision site)   Complete by: As directed    Diet - low sodium heart healthy   Complete by: As directed    Discharge instructions   Complete by: As directed    Radial Site Care Refer to this sheet in the next few weeks. These instructions provide you with information on caring for yourself after your procedure. Your caregiver may also give you more specific instructions. Your treatment has been planned according to current medical practices, but problems sometimes occur. Call your caregiver if you have any problems or questions after your procedure. HOME CARE INSTRUCTIONS You may shower the day after the procedure.Remove the bandage (dressing) and  gently wash the site with plain soap and water.Gently pat the site dry.  Do not apply powder or lotion to the site.  Do not submerge the affected site in water for 3 to 5 days.  Inspect the site at least twice daily.  Do not flex or bend the affected arm for 24 hours.  No lifting over 5 pounds (2.3 kg) for 5 days after your procedure.  Do not drive home if you are discharged the same day of the procedure. Have someone else drive you.  You may drive 24 hours after the procedure unless otherwise instructed by your caregiver.  What to expect: Any bruising will usually fade within 1 to 2 weeks.  Blood that collects in the tissue (hematoma) may be painful to the touch. It should usually decrease in size and tenderness within 1 to 2 weeks.  SEEK IMMEDIATE MEDICAL CARE IF: You have unusual pain at the radial site.  You have redness, warmth, swelling, or pain at the radial site.  You have drainage (other than a small amount of blood on the dressing).  You have chills.  You have a fever or persistent symptoms for more than 72 hours.  You have a fever and your symptoms suddenly get worse.  Your arm becomes pale, cool, tingly, or numb.  You have heavy bleeding from the site. Hold pressure on the site.   Increase activity slowly   Complete by: As directed       Discharge Medications   Allergies as of 04/27/2021      Reactions   Latex Hives   Sulfa Antibiotics Rash      Medication List    STOP taking these medications   Trulicity 1.5 BS/9.6GE Sopn Generic drug: Dulaglutide     TAKE these medications   acetaminophen 500 MG tablet Commonly known as: TYLENOL Take 1,000 mg by mouth every 6 (six) hours as needed for mild pain.   albuterol 108 (90 Base) MCG/ACT inhaler Commonly known as: VENTOLIN HFA Inhale 2 puffs into the lungs every 6 (six) hours as needed for wheezing.   allopurinol 300 MG tablet Commonly known as: ZYLOPRIM Take 1  tablet (300 mg total) by mouth 2 (two) times  daily. What changed: when to take this   apixaban 5 MG Tabs tablet Commonly known as: ELIQUIS Take 1 tablet (5 mg total) by mouth 2 (two) times daily.   atenolol 100 MG tablet Commonly known as: TENORMIN Take 1 tablet (100 mg total) by mouth daily.   atorvastatin 80 MG tablet Commonly known as: LIPITOR Take 1 tablet (80 mg total) by mouth at bedtime.   B-12 PO Take 1,000 mcg by mouth daily.   blood glucose meter kit and supplies Kit Check morning fasting blood glucose daily and up to 4 times daily as directed   Breo Ellipta 200-25 MCG/INH Aepb Generic drug: fluticasone furoate-vilanterol Inhale 1 puff into the lungs daily. Needs appt for further refills   clopidogrel 75 MG tablet Commonly known as: PLAVIX Take 1 tablet (75 mg total) by mouth daily.   EPINEPHrine 0.3 mg/0.3 mL Soaj injection Commonly known as: EPI-PEN Inject 0.3 mg into the muscle as needed for anaphylaxis. Call 9-1-1 after use.   ezetimibe 10 MG tablet Commonly known as: ZETIA Take 1 tablet (10 mg total) by mouth daily.   FISH OIL PO Take 1,000 Units by mouth daily.   fluticasone 50 MCG/ACT nasal spray Commonly known as: FLONASE Place 1 spray into both nostrils daily.   glucose blood test strip To be used twice daily for testing blood sugars. E11.65   ipratropium-albuterol 0.5-2.5 (3) MG/3ML Soln Commonly known as: DUONEB Take 3 mLs by nebulization every 6 (six) hours as needed. What changed: reasons to take this   irbesartan-hydrochlorothiazide 300-12.5 MG tablet Commonly known as: AVALIDE Take 1 tablet by mouth daily.   levalbuterol 45 MCG/ACT inhaler Commonly known as: XOPENEX HFA Inhale 1-2 puffs into the lungs every 6 (six) hours as needed for wheezing or shortness of breath.   levocetirizine 5 MG tablet Commonly known as: Xyzal Allergy 24HR Take 1 tablet (5 mg total) by mouth every evening.   levothyroxine 112 MCG tablet Commonly known as: SYNTHROID Take 1 tablet (112 mcg total)  by mouth daily before breakfast.   metFORMIN 500 MG tablet Commonly known as: GLUCOPHAGE Take 2 tablets (1,000 mg total) by mouth 2 (two) times daily with a meal.   montelukast 10 MG tablet Commonly known as: SINGULAIR Take 1 tablet (10 mg total) by mouth at bedtime.   nitroGLYCERIN 0.4 MG SL tablet Commonly known as: Nitrostat Place 1 tablet (0.4 mg total) under the tongue every 5 (five) minutes as needed for chest pain (or tightness).   [name] 100 MG/ML Soaj Generic drug: Mepolizumab Inject 1 mL (100 mg total) into the skin every 28 (twenty-eight) days.   Ozempic (0.25 or 0.5 MG/DOSE) 2 MG/1.5ML Sopn Generic drug: Semaglutide(0.25 or 0.5MG/DOS) Inject 0.25 mg into the skin once a week for 28 days, THEN 0.5 mg once a week for 28 days. Start taking on: Apr 03, 2021   Spiriva Respimat 2.5 MCG/ACT Aers Generic drug: Tiotropium Bromide Monohydrate Inhale 2 puffs into the lungs daily.         Outstanding Labs/Studies   Outpt referral to lipid clinic done at discharge  Duration of Discharge Encounter   Greater than 30 minutes including physician time.  Signed, Reino Bellis, NP 04/27/2021, 2:26 PM   ATTENDING ATTESTATION  I have seen, examined and evaluated the patient on the AM of discharge along with Reino Bellis, NP-C.  After reviewing all the available data and chart, we discussed the patients laboratory, study &  physical findings as well as symptoms in detail. I agree with her findings, examination as well as impression recommendations as per our discussion.    Admitted with acute coronary syndrome/non-STEMI, likely culprit was multiple tandem lesions in a very tortuous LAD.  Not painful for PCI.  Initial plans are to attempt medical management, staged PCI of the LAD if unable to control symptoms.  She is put back on most of her home medications, would consider the possibility of long-acting nitrate, Ranexa and/or amlodipine and follow-up if necessary.  With  reported statin intolerance, needs lipid clinic referral to consider PCSK9 inhibitor.  She walked with cardiac rehab today with no anginal symptoms.  As such, she is stable for discharge.     Glenetta Hew, M.D., M.S. Interventional Cardiologist   Pager # [phone] Phone # [phone] [address] [place], [place] [address]

## 2021-04-27 NOTE — Progress Notes (Signed)
CARDIAC REHAB PHASE I   PRE:  Rate/Rhythm: 83 SR  BP:  Supine:   Sitting: 102/58  Standing:    SaO2: 96%RA  MODE:  Ambulation: 350 ft   POST:  Rate/Rhythm: 103 ST  BP:  Supine:   Sitting: 133/66  Standing:    SaO2: 96%RA 0855-0952 Pt walked 350 ft on RA with steady gait and no CP. Tolerated well. MI education completed with pt who voiced understanding. Reviewed NTG use, MI restrictions, walking as tolerated, gave heart healthy and diabetic diets, and discussed CRP 2 . Referred to [place] program.   Luetta Nutting, RN BSN  04/27/2021 9:47 AM

## 2021-04-28 ENCOUNTER — Telehealth (HOSPITAL_COMMUNITY): Payer: Self-pay | Admitting: Pharmacist

## 2021-04-28 ENCOUNTER — Other Ambulatory Visit (HOSPITAL_COMMUNITY): Payer: Self-pay

## 2021-04-28 NOTE — Telephone Encounter (Signed)
Pharmacy Transitions of Care Follow-up Telephone Call  Date of discharge: 04/27/2021 How have you been since you were released from the hospital? Excellent  Medication changes made at discharge:  - START: Eliquis/Zetia  - STOPPED: Trulicity  Medication changes verified by the patient? Yes    Medication Accessibility:  Home Pharmacy: [facility]  Was the patient provided with refills on discharged medications? yes  Have all prescriptions been transferred from [facility] to home pharmacy? Yes  Is the patient able to afford medications? Yes    Medication Review:   Follow-up Appointments:  If their condition worsens, is the pt aware to call PCP or go to the Emergency Dept.? Yes  Final Patient Assessment: Patient reports she is doing excellent.  Reviewed medications, newest changes and possible side effects.

## 2021-05-04 ENCOUNTER — Telehealth (HOSPITAL_COMMUNITY): Payer: Self-pay

## 2021-05-04 NOTE — Telephone Encounter (Signed)
Attempted to call patient in regards to Cardiac Rehab - LM on VM 

## 2021-05-04 NOTE — Telephone Encounter (Signed)
Pt insurance is active and benefits verified through [employer]. Co-pay $10.00, DED $0.00/$0.00 met, out of pocket $3,200.00/$20.00 met, co-insurance 0%. No pre-authorization required. Percy/[employer], 05/04/21 @ 4:10PM, RAL#[id number]  Will contact patient to see if she is interested in the Cardiac Rehab Program. If interested, patient will need to complete follow up appt. Once completed, patient will be contacted for scheduling upon review by the RN Navigator.

## 2021-05-05 ENCOUNTER — Telehealth (HOSPITAL_COMMUNITY): Payer: Self-pay

## 2021-05-05 NOTE — Telephone Encounter (Signed)
Returned pt phone call and pt stated that she is interested in the cardiac rehab program once is she cleared after f/u. Will contact pt after she is cleared.

## 2021-05-09 ENCOUNTER — Other Ambulatory Visit: Payer: Self-pay

## 2021-05-09 ENCOUNTER — Encounter: Payer: Self-pay | Admitting: Family Medicine

## 2021-05-09 ENCOUNTER — Ambulatory Visit (INDEPENDENT_AMBULATORY_CARE_PROVIDER_SITE_OTHER): Payer: PPO | Admitting: Family Medicine

## 2021-05-09 VITALS — BP 110/55 | HR 81 | Ht 64.0 in | Wt 188.0 lb

## 2021-05-09 DIAGNOSIS — I252 Old myocardial infarction: Secondary | ICD-10-CM

## 2021-05-09 DIAGNOSIS — E1122 Type 2 diabetes mellitus with diabetic chronic kidney disease: Secondary | ICD-10-CM

## 2021-05-09 DIAGNOSIS — I25119 Atherosclerotic heart disease of native coronary artery with unspecified angina pectoris: Secondary | ICD-10-CM

## 2021-05-09 DIAGNOSIS — N1831 Chronic kidney disease, stage 3a: Secondary | ICD-10-CM

## 2021-05-09 DIAGNOSIS — E118 Type 2 diabetes mellitus with unspecified complications: Secondary | ICD-10-CM

## 2021-05-09 DIAGNOSIS — I10 Essential (primary) hypertension: Secondary | ICD-10-CM

## 2021-05-09 MED ORDER — OZEMPIC (0.25 OR 0.5 MG/DOSE) 2 MG/1.5ML ~~LOC~~ SOPN: 0.5000 mg | PEN_INJECTOR | SUBCUTANEOUS | 2 refills | Status: AC

## 2021-05-09 NOTE — Assessment & Plan Note (Signed)
Will refill her Ozempic. She plans on starting it soon. Will go ahead and send new rx for the 0.5mg  dose.

## 2021-05-09 NOTE — Assessment & Plan Note (Addendum)
Has had one more episode fo CP and had to use her NTG.  If for some reason there is a significant delay in getting into cardiac rehab encouraged her to reach out to me so that we can maybe even get her into physical therapy here in the interim.  In the meantime just encourage regular paced walking as well as some resistance training with bands or light weights.

## 2021-05-09 NOTE — Assessment & Plan Note (Signed)
BP a little low today. Discussed cutting the irbesartan hct in half for next 1-2 weeks and monitor ing BP. If goes higher than 130 ok to go back to whole tab daily.

## 2021-05-09 NOTE — Assessment & Plan Note (Signed)
She is improving . Has f/u with cards coming up.  She has been referred for cardiac rehab.  Work on low intensity walking and resistance training at home .

## 2021-05-09 NOTE — Progress Notes (Signed)
Established Patient Office Visit  Subjective:  Patient ID: Tara Campbell, female    DOB: 1946-06-20  Age: 75 y.o. MRN: [id number]  CC:  Chief Complaint  Patient presents with  . Hospitalization Follow-up    HPI Tara Campbell presents for hospital follow-up.  She was admitted on 524 and discharged home 2 days later on 526 for atypical chest pain and EKG changes.  She was diagnosed with a non-STEMI MI.  She did undergo cardiac catheterization on 525.  It showed severe complex stenosis of the LAD. Placed on Plavix and Eliquis.  Added Zetia.    Last episode of chest pain was last Friday.  She did take a nitroglycerin and that did relieve it.  She has follow-up cardiology on Friday and will hopefully be starting cardiac rehab soon in the meantime she has been trying to walk some.  BP has been running low.  Though she has had some good ones as well.  But just also felt really weak she also realized for about the last 4 days that she had actually been taking 2000 mg of metformin twice a day.  She had accidentally restarted the 1000th in addition to her 67s.  She started having diarrhea.  Past Medical History:  Diagnosis Date  . Allergy   . Arthritis   . Asthma   . Colon polyps   . Diabetes mellitus without complication ([place])   . Gout   . Heart attack ([place]) 07/30/2016   PCI, 2 stents  . Hyperlipidemia   . Hypertension   . Internal hemorrhoids   . Left rotator cuff tear   . 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 Otomycosis of right ear 09/27/2017  . Paroxysmal atrial fibrillation ([facility])   . Thyroid disease     Past Surgical History:  Procedure Laterality Date  . ANGIOPLASTY    . COLONOSCOPY W/ POLYPECTOMY  01/04/2015  . HEMORRHOID BANDING    . LEFT HEART CATH AND CORONARY ANGIOGRAPHY N/A 04/26/2021   Procedure: LEFT HEART CATH AND CORONARY ANGIOGRAPHY;  Surgeon: Sherren Mocha, MD;  Location: [facility];  Service: Cardiovascular;  Laterality: N/A;    Family History   Problem Relation Age of Onset  . Hypertension Mother   . Hyperlipidemia Mother   . Glaucoma Father   . Heart disease Father   . Hypertension Father   . Diabetes Father   . Hyperlipidemia Father   . Skin cancer Father   . Diabetes Paternal Grandmother   . Colon cancer Neg Hx   . Esophageal cancer Neg Hx     Social History   Socioeconomic History  . Marital status: Divorced    Spouse name: Not on file  . Number of children: 2  . Years of education: 13.5  . Highest education level: 12th grade  Occupational History    Comment: retired  Tobacco Use  . Smoking status: Never Smoker  . Smokeless tobacco: Never Used  Vaping Use  . Vaping Use: Never used  Substance and Sexual Activity  . Alcohol use: Yes    Comment: Rare  . Drug use: No  . Sexual activity: Not Currently  Other Topics Concern  . Not on file  Social History Narrative   Lives alone. Likes to crafts and paint in her free time.   Social Determinants of Health   Financial Resource Strain: Low Risk   . Difficulty of Paying Living Expenses: Not hard at all  Food Insecurity: No Food Insecurity  .  Worried About Charity fundraiser in the Last Year: Never true  . Ran Out of Food in the Last Year: Never true  Transportation Needs: No Transportation Needs  . Lack of Transportation (Medical): No  . Lack of Transportation (Non-Medical): No  Physical Activity: Inactive  . Days of Exercise per Week: 0 days  . Minutes of Exercise per Session: 0 min  Stress: No Stress Concern Present  . Feeling of Stress : Not at all  Social Connections: Moderately Isolated  . Frequency of Communication with Friends and Family: More than three times a week  . Frequency of Social Gatherings with Friends and Family: More than three times a week  . Attends Religious Services: Never  . Active Member of Clubs or Organizations: Yes  . Attends Archivist Meetings: More than 4 times per year  . Marital Status: Divorced  Arboriculturist Violence: Not At Risk  . Fear of Current or Ex-Partner: No  . Emotionally Abused: No  . Physically Abused: No  . Sexually Abused: No    Outpatient Medications Prior to Visit  Medication Sig Dispense Refill  . acetaminophen (TYLENOL) 500 MG tablet Take 1,000 mg by mouth every 6 (six) hours as needed for mild pain.    Marland Kitchen albuterol (VENTOLIN HFA) 108 (90 Base) MCG/ACT inhaler Inhale 2 puffs into the lungs every 6 (six) hours as needed for wheezing. 18 g prn  . allopurinol (ZYLOPRIM) 300 MG tablet Take 1 tablet (300 mg total) by mouth 2 (two) times daily. (Patient taking differently: Take 300 mg by mouth daily.) 180 tablet 1  . apixaban (ELIQUIS) 5 MG TABS tablet Take 1 tablet (5 mg total) by mouth 2 (two) times daily. 180 tablet 1  . atenolol (TENORMIN) 100 MG tablet Take 1 tablet (100 mg total) by mouth daily. 90 tablet 1  . atorvastatin (LIPITOR) 80 MG tablet Take 1 tablet (80 mg total) by mouth at bedtime. 90 tablet 3  . blood glucose meter kit and supplies KIT Check morning fasting blood glucose daily and up to 4 times daily as directed 1 each 0  . clopidogrel (PLAVIX) 75 MG tablet Take 1 tablet (75 mg total) by mouth daily. 90 tablet 1  . Cyanocobalamin (B-12 PO) Take 1,000 mcg by mouth daily.    Marland Kitchen EPINEPHrine 0.3 mg/0.3 mL IJ SOAJ injection Inject 0.3 mg into the muscle as needed for anaphylaxis. Call 9-1-1 after use. 1 each 2  . ezetimibe (ZETIA) 10 MG tablet Take 1 tablet (10 mg total) by mouth daily. 90 tablet 0  . fluticasone (FLONASE) 50 MCG/ACT nasal spray Place 1 spray into both nostrils daily. 16 g 3  . fluticasone furoate-vilanterol (BREO ELLIPTA) 200-25 MCG/INH AEPB Inhale 1 puff into the lungs daily. Needs appt for further refills 60 each 6  . glucose blood test strip To be used twice daily for testing blood sugars. E11.65 200 each 11  . ipratropium-albuterol (DUONEB) 0.5-2.5 (3) MG/3ML SOLN Take 3 mLs by nebulization every 6 (six) hours as needed. (Patient taking  differently: Take 3 mLs by nebulization every 6 (six) hours as needed (shortness of breath).) 3 mL PRN  . irbesartan-hydrochlorothiazide (AVALIDE) 300-12.5 MG tablet Take 1 tablet by mouth daily. 90 tablet 1  . levalbuterol (XOPENEX HFA) 45 MCG/ACT inhaler Inhale 1-2 puffs into the lungs every 6 (six) hours as needed for wheezing or shortness of breath.    . levothyroxine (SYNTHROID) 112 MCG tablet Take 1 tablet (112 mcg total) by mouth  daily before breakfast. 90 tablet 1  . Mepolizumab ([name]) 100 MG/ML SOAJ Inject 1 mL (100 mg total) into the skin every 28 (twenty-eight) days. 1 mL 5  . metFORMIN (GLUCOPHAGE) 500 MG tablet Take 2 tablets (1,000 mg total) by mouth 2 (two) times daily with a meal. 360 tablet 1  . montelukast (SINGULAIR) 10 MG tablet Take 1 tablet (10 mg total) by mouth at bedtime. 90 tablet 3  . nitroGLYCERIN (NITROSTAT) 0.4 MG SL tablet Place 1 tablet (0.4 mg total) under the tongue every 5 (five) minutes as needed for chest pain (or tightness). 30 tablet prn  . Omega-3 Fatty Acids (FISH OIL PO) Take 1,000 Units by mouth daily.    Marland Kitchen SPIRIVA RESPIMAT 2.5 MCG/ACT AERS Inhale 2 puffs into the lungs daily. 4 g 5  . levocetirizine (XYZAL ALLERGY 24HR) 5 MG tablet Take 1 tablet (5 mg total) by mouth every evening. 90 tablet 3  . Semaglutide,0.25 or 0.5MG/DOS, (OZEMPIC, 0.25 OR 0.5 MG/DOSE,) 2 MG/1.5ML SOPN Inject 0.25 mg into the skin once a week for 28 days, THEN 0.5 mg once a week for 28 days. 1.5 mL 3   No facility-administered medications prior to visit.    Allergies  Allergen Reactions  . Latex Hives  . Sulfa Antibiotics Rash    ROS Review of Systems    Objective:    Physical Exam Constitutional:      Appearance: She is well-developed.  HENT:     Head: Normocephalic and atraumatic.  Cardiovascular:     Rate and Rhythm: Normal rate and regular rhythm.     Heart sounds: Normal heart sounds.  Pulmonary:     Effort: Pulmonary effort is normal.     Breath sounds:  Normal breath sounds.  Skin:    General: Skin is warm and dry.  Neurological:     Mental Status: She is alert and oriented to person, place, and time.  Psychiatric:        Behavior: Behavior normal.     BP (!) 110/55   Pulse 81   Ht _0  (1.626 m)   Wt 188 lb (85.3 kg)   SpO2 99%   BMI 32.27 kg/m  Wt Readings from Last 3 Encounters:  05/09/21 188 lb (85.3 kg)  04/27/21 191 lb 1.6 oz (86.7 kg)  04/24/21 195 lb (88.5 kg)     Health Maintenance Due  Topic Date Due  . Pneumococcal Vaccine 88-29 Years old (1 of 4 - PCV13) Never done    There are no preventive care reminders to display for this patient.  Lab Results  Component Value Date   TSH 2.65 04/24/2021   Lab Results  Component Value Date   WBC 7.2 04/27/2021   HGB 10.9 (L) 04/27/2021   HCT 32.4 (L) 04/27/2021   MCV 87.6 04/27/2021   PLT 265 04/27/2021   Lab Results  Component Value Date   NA 135 04/27/2021   K 4.2 04/27/2021   CO2 23 04/27/2021   GLUCOSE 208 (H) 04/27/2021   BUN 16 04/27/2021   CREATININE 1.11 (H) 04/27/2021   BILITOT 0.4 04/24/2021   ALKPHOS 100 05/29/2017   AST 19 04/24/2021   ALT 17 04/24/2021   PROT 7.1 04/24/2021   ALBUMIN 4.2 05/29/2017   CALCIUM 9.3 04/27/2021   ANIONGAP 9 04/27/2021   Lab Results  Component Value Date   CHOL 240 (H) 04/26/2021   Lab Results  Component Value Date   HDL 47 04/26/2021   Lab Results  Component Value Date   LDLCALC 143 (H) 04/26/2021   Lab Results  Component Value Date   TRIG 248 (H) 04/26/2021   Lab Results  Component Value Date   CHOLHDL 5.1 04/26/2021   Lab Results  Component Value Date   HGBA1C 8.8 (H) 04/26/2021      Assessment & Plan:   Problem List Items Addressed This Visit      Cardiovascular and Mediastinum   Hypertension goal BP (blood pressure) < 140/90    BP a little low today. Discussed cutting the irbesartan hct in half for next 1-2 weeks and monitor ing BP. If goes higher than 130 ok to go back to whole  tab daily.        Coronary artery disease involving native coronary artery of native heart with angina pectoris ([place])    Has had one more episode fo CP and had to use her NTG.  If for some reason there is a significant delay in getting into cardiac rehab encouraged her to reach out to me so that we can maybe even get her into physical therapy here in the interim.  In the meantime just encourage regular paced walking as well as some resistance training with bands or light weights.        Endocrine   Type 2 diabetes mellitus with diabetic chronic kidney disease ([place]) - Primary   Relevant Medications   Semaglutide,0.25 or 0.5MG/DOS, (OZEMPIC, 0.25 OR 0.5 MG/DOSE,) 2 MG/1.5ML SOPN   Controlled diabetes mellitus type 2 with complications ([facility])    Will refill her Ozempic. She plans on starting it soon. Will go ahead and send new rx for the 0.36m dose.        Relevant Medications   Semaglutide,0.25 or 0.5MG/DOS, (OZEMPIC, 0.25 OR 0.5 MG/DOSE,) 2 MG/1.5ML SOPN     Other   History of non-ST elevation myocardial infarction (NSTEMI)    She is improving . Has f/u with cards coming up.  She has been referred for cardiac rehab.  Work on low intensity walking and resistance training at home .         Meds ordered this encounter  Medications  . Semaglutide,0.25 or 0.5MG/DOS, (OZEMPIC, 0.25 OR 0.5 MG/DOSE,) 2 MG/1.5ML SOPN    Sig: Inject 0.5 mg into the skin once a week for 28 days.    Dispense:  3 mL    Refill:  2    Follow-up: Return in about 3 months (around 07/27/2021) for Hypertension, Diabetes follow-up.    CBeatrice Lecher MD

## 2021-05-09 NOTE — Patient Instructions (Addendum)
Please start taking a half a tab irbesartan- HCTZ daily.  Try to track your blood pressures daily over the next 2 weeks.  I would love to hear from you in about a week or 2 to see how those pressures are doing.  If they start creeping back up over 130 then please restart a whole tab.  Let me know if the diarrhea does not improve, or if you still just do not feel well towards the end of the week.

## 2021-05-14 NOTE — Progress Notes (Signed)
Cardiology Clinic Note   Patient Name: Tara Campbell Date of Encounter: 05/16/2021  Primary Care Provider:  Hali Marry, MD Primary Cardiologist:  Kirk Ruths, MD  Patient Profile    Tara Campbell 75 year old female presents the clinic today for follow-up evaluation of her NSTEMI and paroxysmal atrial fibrillation.  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   Otomycosis of right ear 09/27/2017   Paroxysmal atrial fibrillation ([place])    Thyroid disease    Past Surgical History:  Procedure Laterality Date   ANGIOPLASTY     COLONOSCOPY W/ POLYPECTOMY  01/04/2015   HEMORRHOID BANDING     LEFT HEART CATH AND CORONARY ANGIOGRAPHY N/A 04/26/2021   Procedure: LEFT HEART CATH AND CORONARY ANGIOGRAPHY;  Surgeon: Sherren Mocha, MD;  Location: [facility];  Service: Cardiovascular;  Laterality: N/A;    Allergies  Allergies  Allergen Reactions   Latex Hives   Sulfa Antibiotics Rash    History of Present Illness    Tara Campbell has a PMH of paroxysmal atrial fibrillation, essential hypertension, type 2 diabetes, and NSTEMI.  She had a previous cardiac catheterization with PCI to her RCA.  She presented to the emergency department after being found to have elevated troponins in the 1000's.  She noted that she developed chest pain over the last several days.  She described her pain as burning chest pressure in the central portion of her chest.  She does notice that it worsened after eating and occurred several times.  She reported some nausea and one episode of vomiting.  She wanted to be evaluated by her PCP.  She was sent for lab work and EKG.  Her EKG showed  nonspecific T wave inversion.  Her outpatient lab work showed an unremarkable CBC with hemoglobin 13.2 and white count of 10.3.  Her creatinine was 1.15 with a troponin of 1021.  She presented to the emergency department and was not having active chest pain.  She reported intermittent episodes throughout the last 24-48 hours.  Her repeat labs showed a creatinine of 1.26 and a troponin of 379.  Cardiology was consulted for evaluation due to troponin downtrend.  Her lipid panel 4/22 showed a total cholesterol of 266 with an LDL 165.  Her A1c was 8.7.  She was admitted with plans of cardiac catheterization.  She underwent cardiac catheterization 04/26/2021 which showed severe complex stenosis of her LAD in the proximal mid and distal portion of the vessel.  Patent left circumflex with no significant stenosis, patent left main with no significant stenosis, patent RCA with RCA stents mild nonobstructive disease less than 50% stenosis and normal LVEDP.  Her LAD stenosis was unfavorable for PCI due to heavy calcification and marked tortuosity.  There is also noted to be a small caliber vessel.  Medical therapy was recommended.  She presents the clinic today for follow-up evaluation states she feels well.  She has been increasing her physical activity and is walking 10 minutes twice a day every other day.  She has been eating a heart healthy diet.  She presents to the clinic with her sister who is a retired Therapist, sports.  We discussed the importance of lowering her cholesterol.  She has a visit scheduled with the lipid clinic next week.  We discussed her anginal equivalent symptoms.  With her first cardiac catheterization she had pain/pressure between her shoulder blades.  With this episode she noticed a sensation that a pill was caught in her throat for several days and then had vomiting.  She has had 1 episode of chest discomfort that happened about 3 days after being discharged from the hospital.  She did take 1 nitroglycerin  at that time and has not had any chest discomfort with increased physical activity since that time.  I will have her maintain her physical activity/increase as tolerated, give the salty 6 diet sheet, order lipid and liver in 4 weeks, and have her follow-up in 3 months with Dr. Stanford Breed.  Today she denies chest pain, shortness of breath, lower extremity edema, fatigue, palpitations, melena, hematuria, hemoptysis, diaphoresis, weakness, presyncope, syncope, orthopnea, and PND.   Home Medications    Prior to Admission medications   Medication Sig Start Date End Date Taking? Authorizing Provider  acetaminophen (TYLENOL) 500 MG tablet Take 1,000 mg by mouth every 6 (six) hours as needed for mild pain.    [provider]  albuterol (VENTOLIN HFA) 108 (90 Base) MCG/ACT inhaler Inhale 2 puffs into the lungs every 6 (six) hours as needed for wheezing. 11/16/19   Hali Marry, MD  allopurinol (ZYLOPRIM) 300 MG tablet Take 1 tablet (300 mg total) by mouth 2 (two) times daily. Patient taking differently: Take 300 mg by mouth daily. 03/24/21   Hali Marry, MD  apixaban (ELIQUIS) 5 MG TABS tablet Take 1 tablet (5 mg total) by mouth 2 (two) times daily. 03/24/21   Hali Marry, MD  atenolol (TENORMIN) 100 MG tablet Take 1 tablet (100 mg total) by mouth daily. 03/24/21   Hali Marry, MD  atorvastatin (LIPITOR) 80 MG tablet Take 1 tablet (80 mg total) by mouth at bedtime. 03/24/21   Hali Marry, MD  blood glucose meter kit and supplies KIT Check morning fasting blood glucose daily and up to 4 times daily as directed 04/03/18   [name], PA-C  clopidogrel (PLAVIX) 75 MG tablet Take 1 tablet (75 mg total) by mouth daily. 04/27/21   Cheryln Manly, NP  Cyanocobalamin (B-12 PO) Take 1,000 mcg by mouth daily.    [provider]  EPINEPHrine 0.3 mg/0.3 mL IJ SOAJ injection Inject 0.3 mg into the muscle as needed for anaphylaxis. Call 9-1-1  after use. 03/06/21   Mannam, Hart Robinsons, MD  ezetimibe (ZETIA) 10 MG tablet Take 1 tablet (10 mg total) by mouth daily. 04/27/21   Cheryln Manly, NP  fluticasone (FLONASE) 50 MCG/ACT nasal spray Place 1 spray into both nostrils daily. 08/25/18   [name], PA-C  fluticasone furoate-vilanterol (BREO ELLIPTA) 200-25 MCG/INH AEPB Inhale 1 puff into the lungs daily. Needs appt for further refills 09/28/20   Parrett, [name] S, NP  glucose blood test strip To be used twice daily for testing blood sugars. E11.65 04/03/21   Hali Marry, MD  ipratropium-albuterol (DUONEB) 0.5-2.5 (3) MG/3ML SOLN Take 3 mLs by nebulization every 6 (six) hours as needed. Patient taking differently: Take 3 mLs by nebulization every 6 (six) hours as needed (shortness of breath). 01/25/20   Hali Marry, MD  irbesartan-hydrochlorothiazide (AVALIDE) 300-12.5 MG tablet Take 1  tablet by mouth daily. 03/24/21   Hali Marry, MD  levalbuterol [facility] HFA) 45 MCG/ACT inhaler Inhale 1-2 puffs into the lungs every 6 (six) hours as needed for wheezing or shortness of breath. 01/06/17   [provider]  levothyroxine (SYNTHROID) 112 MCG tablet Take 1 tablet (112 mcg total) by mouth daily before breakfast. 03/24/21   Hali Marry, MD  Mepolizumab ([name]) 100 MG/ML SOAJ Inject 1 mL (100 mg total) into the skin every 28 (twenty-eight) days. 03/08/21   Marshell Garfinkel, MD  metFORMIN (GLUCOPHAGE) 500 MG tablet Take 2 tablets (1,000 mg total) by mouth 2 (two) times daily with a meal. 04/24/21 07/23/21  Hali Marry, MD  montelukast (SINGULAIR) 10 MG tablet Take 1 tablet (10 mg total) by mouth at bedtime. 10/05/19   Hali Marry, MD  nitroGLYCERIN (NITROSTAT) 0.4 MG SL tablet Place 1 tablet (0.4 mg total) under the tongue every 5 (five) minutes as needed for chest pain (or tightness). 04/03/21   Hali Marry, MD  Omega-3 Fatty Acids (FISH OIL PO) Take 1,000 Units by mouth  daily.    [provider]  Semaglutide,0.25 or 0.5MG/DOS, (OZEMPIC, 0.25 OR 0.5 MG/DOSE,) 2 MG/1.5ML SOPN Inject 0.5 mg into the skin once a week for 28 days. 05/09/21 06/06/21  Hali Marry, MD  SPIRIVA RESPIMAT 2.5 MCG/ACT AERS Inhale 2 puffs into the lungs daily. 11/18/20   Parrett, Fonnie Mu, NP    Family History    Family History  Problem Relation Age of Onset   Hypertension Mother    Hyperlipidemia Mother    Glaucoma Father    Heart disease Father    Hypertension Father    Diabetes Father    Hyperlipidemia Father    Skin cancer Father    Diabetes Paternal Grandmother    Colon cancer Neg Hx    Esophageal cancer Neg Hx    She indicated that her mother is deceased. She indicated that her father is deceased. She indicated that her sister is alive. She indicated that the status of her paternal grandmother is unknown. She indicated that the status of her neg hx is unknown.  Social History    Social History   Socioeconomic History   Marital status: Divorced    Spouse name: Not on file   Number of children: 2   Years of education: 13.5   Highest education level: 12th grade  Occupational History    Comment: retired  Tobacco Use   Smoking status: Never   Smokeless tobacco: Never  Vaping Use   Vaping Use: Never used  Substance and Sexual Activity   Alcohol use: Yes    Comment: Rare   Drug use: No   Sexual activity: Not Currently  Other Topics Concern   Not on file  Social History Narrative   Lives alone. Likes to crafts and paint in her free time.   Social Determinants of Health   Financial Resource Strain: Low Risk    Difficulty of Paying Living Expenses: Not hard at all  Food Insecurity: No Food Insecurity   Worried About Charity fundraiser in the Last Year: Never true   [place] in the Last Year: Never true  Transportation Needs: No Transportation Needs   Lack of Transportation (Medical): No   Lack of Transportation (Non-Medical): No   Physical Activity: Inactive   Days of Exercise per Week: 0 days   Minutes of Exercise per Session: 0 min  Stress: No Stress  Concern Present   Feeling of Stress : Not at all  Social Connections: Moderately Isolated   Frequency of Communication with Friends and Family: More than three times a week   Frequency of Social Gatherings with Friends and Family: More than three times a week   Attends Religious Services: Never   Marine scientist or Organizations: Yes   Attends Music therapist: More than 4 times per year   Marital Status: Divorced  Human resources officer Violence: Not At Risk   Fear of Current or Ex-Partner: No   Emotionally Abused: No   Physically Abused: No   Sexually Abused: No     Review of Systems    General:  No chills, fever, night sweats or weight changes.  Cardiovascular:  No chest pain, dyspnea on exertion, edema, orthopnea, palpitations, paroxysmal nocturnal dyspnea. Dermatological: No rash, lesions/masses Respiratory: No cough, dyspnea Urologic: No hematuria, dysuria Abdominal:   No nausea, vomiting, diarrhea, bright red blood per rectum, melena, or hematemesis Neurologic:  No visual changes, wkns, changes in mental status. All other systems reviewed and are otherwise negative except as noted above.  Physical Exam    VS:  BP (!) 116/52   Pulse 72   Ht 5' 4" (1.626 m)   Wt 190 lb 3.2 oz (86.3 kg)   SpO2 95%   BMI 32.65 kg/m  , BMI Body mass index is 32.65 kg/m. GEN: Well nourished, well developed, in no acute distress. HEENT: normal. Neck: Supple, no JVD, carotid bruits, or masses. Cardiac: RRR, no murmurs, rubs, or gallops. No clubbing, cyanosis, edema.  Radials/DP/PT 2+ and equal bilaterally.  Respiratory:  Respirations regular and unlabored, clear to auscultation bilaterally. GI: Soft, nontender, nondistended, BS + x 4. MS: no deformity or atrophy. Skin: warm and dry, no rash. Neuro:  Strength and sensation are intact. Psych: Normal  affect.  Accessory Clinical Findings    Recent Labs: 04/24/2021: ALT 17; TSH 2.65 04/27/2021: BUN 16; Creatinine, Ser 1.11; Hemoglobin 10.9; Platelets 265; Potassium 4.2; Sodium 135   Recent Lipid Panel    Component Value Date/Time   CHOL 240 (H) 04/26/2021 0449   TRIG 248 (H) 04/26/2021 0449   HDL 47 04/26/2021 0449   CHOLHDL 5.1 04/26/2021 0449   VLDL 50 (H) 04/26/2021 0449   LDLCALC 143 (H) 04/26/2021 0449   LDLCALC 165 (H) 03/22/2021 0000    ECG personally reviewed by me today-sinus rhythm with first-degree AV block low voltage QRS 72 bpm- No acute changes  Echocardiogram 04/26/2021 IMPRESSIONS     1. Left ventricular ejection fraction, by estimation, is 60 to 65%. The  left ventricle has normal function. The left ventricle has no regional  wall motion abnormalities. There is mild concentric left ventricular  hypertrophy. Left ventricular diastolic  parameters are consistent with Grade I diastolic dysfunction (impaired  relaxation). Elevated left atrial pressure.   2. Right ventricular systolic function is normal. The right ventricular  size is normal.   3. Left atrial size was moderately dilated.   4. The mitral valve is normal in structure. No evidence of mitral valve  regurgitation. No evidence of mitral stenosis.   5. The aortic valve is normal in structure. Aortic valve regurgitation is  not visualized. No aortic stenosis is present.   6. The inferior vena cava is normal in size with greater than 50%  respiratory variability, suggesting right atrial pressure of 3 mmHg.   Comparison(s): No significant change from prior study. Prior images  reviewed side by  side.  Cardiac catheterization 04/26/2021 1.  Severe complex stenosis of the LAD in the proximal, mid, and distal vessel. 2.  Patent left circumflex no significant stenosis 3.  Patent left main with no significant stenosis 4.  Patent RCA with patent RCA stents, mild nonobstructive disease present with less than  50% stenosis 5.  Normal LVEDP   Recommendations: The patient has severe diffuse LAD stenoses.  The vessel is unfavorable for PCI with heavy calcification, marked tortuosity, and small vessel caliber.  I would favor medical therapy as an initial approach.  If she fails medical therapy would be reasonable to attempt PCI of the proximal and mid vessel with technical concerns noted above  Diagnostic Dominance: Right      Assessment & Plan   1.  NSTEMI-no chest pain today.  Underwent cardiac catheterization 04/26/2021 and was noted to have a heavily calcified tortuous, severely stenosed proximal mid and distal LAD.  Due to tortuosity condition medical management was recommended.  She was placed on Plavix Eliquis and her atorvastatin, atenolol, and HCTZ were continued.  Echocardiogram showed EF 60-65% with no regional wall motion abnormalities, G1 DD, and mildly dilated LA. Continue atorvastatin, atenolol, Eliquis, HCTZ, clopidogrel, irbesartan Heart healthy low-sodium diet-salty 6 given Increase physical activity as tolerated  Hyperlipidemia-04/26/2021: Cholesterol 240; HDL 47; LDL Cholesterol 143; Triglycerides 248; VLDL 50 Continue atorvastatin, ezetimibe Heart healthy low-sodium high-fiber diet Increase physical activity as tolerated Follow-up with lipid clinic as scheduled  Essential hypertension-BP today 116/52.  Well-controlled at home. Continue irbesartan, HCTZ, atenolol Heart healthy low-sodium diet-salty 6 given Increase physical activity as tolerated  Paroxysmal atrial fibrillation-no recent episodes of increased or irregular heartbeat.  Previously noticed in the postoperative setting several years ago. Continue Eliquis Heart healthy low-sodium diet-salty 6 given Increase physical activity as tolerated  Type 2 diabetes-A1c 8.8 Continue metformin Recommend SGLT2 inhibitor Heart healthy low-sodium diet-salty 6 given Increase physical activity as tolerated Follows with  PCP  Asthma-follows with pulmonology Continue current medical therapy  Disposition: Follow-up with Dr. Stanford Breed in 3 months.  Jossie Ng. Romell Cavanah NP-C    05/16/2021, 9:43 AM [address] Office [phone] Fax [phone]  Notice: This dictation was prepared with Dragon dictation along with smaller phrase technology. Any transcriptional errors that result from this process are unintentional and may not be corrected upon review.  I spent 15 minutes examining this patient, reviewing medications, and using patient centered shared decision making involving her cardiac care.  Prior to her visit I spent greater than 20 minutes reviewing her past medical history,  medications, and prior cardiac tests.

## 2021-05-16 ENCOUNTER — Encounter: Payer: Self-pay | Admitting: General Practice

## 2021-05-16 ENCOUNTER — Ambulatory Visit: Payer: PPO | Admitting: General Practice

## 2021-05-16 ENCOUNTER — Other Ambulatory Visit: Payer: Self-pay

## 2021-05-16 VITALS — BP 116/52 | HR 72 | Ht 64.0 in | Wt 190.2 lb

## 2021-05-16 DIAGNOSIS — I480 Paroxysmal atrial fibrillation: Secondary | ICD-10-CM | POA: Diagnosis not present

## 2021-05-16 DIAGNOSIS — Z79899 Other long term (current) drug therapy: Secondary | ICD-10-CM

## 2021-05-16 DIAGNOSIS — I10 Essential (primary) hypertension: Secondary | ICD-10-CM | POA: Diagnosis not present

## 2021-05-16 DIAGNOSIS — E782 Mixed hyperlipidemia: Secondary | ICD-10-CM

## 2021-05-16 DIAGNOSIS — I214 Non-ST elevation (NSTEMI) myocardial infarction: Secondary | ICD-10-CM

## 2021-05-16 DIAGNOSIS — J4520 Mild intermittent asthma, uncomplicated: Secondary | ICD-10-CM | POA: Diagnosis not present

## 2021-05-16 NOTE — Patient Instructions (Signed)
Medication Instructions:  The current medical regimen is effective;  continue present plan and medications as directed. Please refer to the Current Medication list given to you today.  *If you need a refill on your cardiac medications before your next appointment, please call your pharmacy*  Lab Work: FASTING LIPID AND LFT ON 06-19-2021 If you have labs (blood work) drawn today and your tests are completely normal, you will receive your results only by:  MyChart Message (if you have MyChart) OR A paper copy in the mail.  If you have any lab test that is abnormal or we need to change your treatment, we will call you to review the results. You may go to any [facility] that is convenient for you however, we do have a lab in our office that is able to assist you. You DO NOT need an appointment for our lab. The lab is open 8:00am and closes at 4:00pm. Lunch 12:45 - 1:45pm.  Special Instructions CLEARED FOR CARDIAC REHAB  PLEASE READ AND FOLLOW SALTY 6-ATTACHED-1,800 mg daily  PLEASE INCREASE PHYSICAL ACTIVITY AS TOLERATED  Follow-Up: Your next appointment: KEEP LIPID CLINIC APPOINTMENT AND 3 month(s) In Person with Olga Millers, MD   At [facility], you and your health needs are our priority.  As part of our continuing mission to provide you with exceptional heart care, we have created designated Provider Care Teams.  These Care Teams include your primary Cardiologist (physician) and Advanced Practice Providers (APPs -  Physician Assistants and Nurse Practitioners) who all work together to provide you with the care you need, when you need it.

## 2021-05-24 ENCOUNTER — Ambulatory Visit (INDEPENDENT_AMBULATORY_CARE_PROVIDER_SITE_OTHER): Payer: PPO | Admitting: Pharmacist Clinician (PhC)/ Clinical Pharmacy Specialist

## 2021-05-24 ENCOUNTER — Other Ambulatory Visit: Payer: Self-pay

## 2021-05-24 DIAGNOSIS — E782 Mixed hyperlipidemia: Secondary | ICD-10-CM | POA: Diagnosis not present

## 2021-05-24 NOTE — Patient Instructions (Addendum)
Your Results:             Your most recent labs Goal  Total Cholesterol 240 < 200  Triglycerides 248 < 150  HDL (happy/good cholesterol) 47 > 40  LDL (lousy/bad cholesterol 143 < 50   Medication changes:  We will start the paperwork to get Repatha Pushtronix covered by your insurance.  Once approved, C[facility] will call to let you know.    Lab orders:  Repeat labs about 2 weeks after second injection.    Patient Assistance:  The [employer] foundation offers assistance to help pay for medication copays.  They will cover copays for all cholesterol lowering meds, including statins, fibrates, omega-3 oils, ezetimibe, Repatha, Praluent, Nexletol, Nexlizet.  The cards are usually good for $2,500 or 12 months, whichever comes first. Go to healthwellfoundation.org Click on "Apply Now" Answer questions as to whom is applying (patient or representative) Your disease fund will be "hypercholesterolemia - Medicare access" They will ask questions about finances and which medications you are taking for cholesterol When you submit, the approval is usually within minutes.  You will need to print the card information from the site You will need to show this information to your pharmacy, they will bill your Medicare Part D plan first -then bill [employer] --for the copay.   You can also call them at [phone], although the hold times can be quite long.   Thank you for choosing [facility]

## 2021-05-24 NOTE — Progress Notes (Signed)
05/29/2021 Tara Campbell 09-10-1946 [id number]   HPI:  Tara Campbell is a 75 y.o. female patient of Dr Stanford Breed, who presents today for a lipid clinic evaluation.  See pertinent past medical history below.   She was admitted to [facility] on May 24 with NSTEMI after being evaluated at her PCP for nausea/vomiting.  Cath was done, with no intervention, and patient placed on medication therapy.  She has been on atorvastatin 80 mg since her first MI in 2017, without any adverse effects.  However at her most recent Cardiology visit her LDL was down only to 143.  Ezetimibe 10 mg was added, however this combination is not going to drop her from 143 to < 55.   Of note patient has latex allergy.    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, 5/22 - NSTEMI 80-90% blockages in LAD, starting with medical therapy to treat due to tortuosity and small vessel caliber  Atrial fibrillation CHADS2-VASc score  - on apixaban  DM2 A1c 8.8 - on metformin  asthma Severe, persistent - Xopenex/albuterol, Breo Ellipta, Spiriva, [name]   Current Medications: atorvastatin 80, ezetimibe 10  Cholesterol Goals: LDL < 55  Family history: mother had CEA around 70, died at 1; father CABG at 24, died at 59; 1 sister, no heart disease; children adopted  Diet: mix of eating out and home; more sit-down restaurants, no deep fried; plenty of veggies, mostly fresh; snacks on popcorn (salt and butter)  Exercise:  recently started walking, waiting to start cardiac rehab  Labs: 04/26/21 TC 240, TG 248, HDL 47, LDL 143  (Highest LDL in epic listed as 186)  Current Outpatient Medications  Medication Sig Dispense Refill   acetaminophen (TYLENOL) 500 MG tablet Take 1,000 mg by mouth every 6 (six) hours as needed for mild pain.     albuterol (VENTOLIN HFA) 108 (90 Base) MCG/ACT inhaler Inhale 2 puffs into the lungs every 6 (six) hours as needed for  wheezing. 18 g prn   allopurinol (ZYLOPRIM) 300 MG tablet Take 1 tablet (300 mg total) by mouth 2 (two) times daily. (Patient taking differently: Take 300 mg by mouth daily.) 180 tablet 1   apixaban (ELIQUIS) 5 MG TABS tablet Take 1 tablet (5 mg total) by mouth 2 (two) times daily. 180 tablet 1   atenolol (TENORMIN) 100 MG tablet Take 1 tablet (100 mg total) by mouth daily. 90 tablet 1   atorvastatin (LIPITOR) 80 MG tablet Take 1 tablet (80 mg total) by mouth at bedtime. 90 tablet 3   blood glucose meter kit and supplies KIT Check morning fasting blood glucose daily and up to 4 times daily as directed 1 each 0   clopidogrel (PLAVIX) 75 MG tablet Take 1 tablet (75 mg total) by mouth daily. 90 tablet 1   Cyanocobalamin (B-12 PO) Take 1,000 mcg by mouth daily.     EPINEPHrine 0.3 mg/0.3 mL IJ SOAJ injection Inject 0.3 mg into the muscle as needed for anaphylaxis. Call 9-1-1 after use. 1 each 2   ezetimibe (ZETIA) 10 MG tablet Take 1 tablet (10 mg total) by mouth daily. 90 tablet 0   fluticasone (FLONASE) 50 MCG/ACT nasal spray Place 1 spray into both nostrils daily. 16 g 3   fluticasone furoate-vilanterol (BREO ELLIPTA) 200-25 MCG/INH AEPB Inhale 1 puff into the lungs daily. Needs appt for further refills 60 each 6   glucose blood test strip To  be used twice daily for testing blood sugars. E11.65 200 each 11   ipratropium-albuterol (DUONEB) 0.5-2.5 (3) MG/3ML SOLN Take 3 mLs by nebulization every 6 (six) hours as needed. (Patient taking differently: Take 3 mLs by nebulization every 6 (six) hours as needed (shortness of breath).) 3 mL PRN   irbesartan-hydrochlorothiazide (AVALIDE) 300-12.5 MG tablet Take 1 tablet by mouth daily. (Patient taking differently: Take 1 tablet by mouth daily. Take 1/2 tablet daily) 90 tablet 1   levalbuterol (XOPENEX HFA) 45 MCG/ACT inhaler Inhale 1-2 puffs into the lungs every 6 (six) hours as needed for wheezing or shortness of breath.     levothyroxine (SYNTHROID) 112 MCG  tablet Take 1 tablet (112 mcg total) by mouth daily before breakfast. 90 tablet 1   Mepolizumab ([name]) 100 MG/ML SOAJ Inject 1 mL (100 mg total) into the skin every 28 (twenty-eight) days. 1 mL 5   metFORMIN (GLUCOPHAGE) 500 MG tablet Take 2 tablets (1,000 mg total) by mouth 2 (two) times daily with a meal. 360 tablet 1   nitroGLYCERIN (NITROSTAT) 0.4 MG SL tablet Place 1 tablet (0.4 mg total) under the tongue every 5 (five) minutes as needed for chest pain (or tightness). 30 tablet prn   Omega-3 Fatty Acids (FISH OIL PO) Take 1,000 Units by mouth daily.     Semaglutide,0.25 or 0.5MG/DOS, (OZEMPIC, 0.25 OR 0.5 MG/DOSE,) 2 MG/1.5ML SOPN Inject 0.5 mg into the skin once a week for 28 days. 3 mL 2   SPIRIVA RESPIMAT 2.5 MCG/ACT AERS Inhale 2 puffs into the lungs daily. 4 g 5   No current facility-administered medications for this visit.    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 rate 17, height '5\' 4"'  (1.626 m), weight 191 lb 6.4 oz (86.8 kg), SpO2 93 %.   Hyperlipidemia Patient with ASCVD and hyperlipidemia, not at LDL goal despite high intensity atorvastatin.  She has recently added ezetimibe, however with an LDL goal of 55, she will need more aggressive treatment.  Reviewed options for lowering LDL cholesterol, including PCSK-9 inhibitors, bempedoic acid and inclisiran.  Discussed mechanisms of action, dosing, side effects and potential decreases in LDL cholesterol.  Answered all patient questions.  Based  on this information, patient would prefer to start [place] [employer].  Will start the paperwork to get this approved by her insurer, then repeat labs after 2-3 doses.     Tommy Medal PharmD CPP [facility] [address] [place], [place] [address] [phone]

## 2021-05-29 ENCOUNTER — Encounter: Payer: Self-pay | Admitting: Pharmacist Clinician (PhC)/ Clinical Pharmacy Specialist

## 2021-05-29 ENCOUNTER — Telehealth: Payer: Self-pay

## 2021-05-29 DIAGNOSIS — E1169 Type 2 diabetes mellitus with other specified complication: Secondary | ICD-10-CM

## 2021-05-29 MED ORDER — REPATHA PUSHTRONEX SYSTEM 420 MG/3.5ML ~~LOC~~ SOCT: 420.0000 mg | SUBCUTANEOUS | 11 refills | Status: DC

## 2021-05-29 NOTE — Telephone Encounter (Signed)
Called and spoke w/pt and stated that they were approved for repatha 420mg , rx sent, pt instructed to complete fasting labs post 4th dose and to call back if unaffordable. Pt voiced understanding

## 2021-05-29 NOTE — Assessment & Plan Note (Signed)
Patient with ASCVD and hyperlipidemia, not at LDL goal despite high intensity atorvastatin.  She has recently added ezetimibe, however with an LDL goal of 55, she will need more aggressive treatment.  Reviewed options for lowering LDL cholesterol, including PCSK-9 inhibitors, bempedoic acid and inclisiran.  Discussed mechanisms of action, dosing, side effects and potential decreases in LDL cholesterol.  Answered all patient questions.  Based on this information, patient would prefer to start Repatha P[employer]ron[employer].  Will start the paperwork to get this approved by her insurer, then repeat labs after 2-3 doses.

## 2021-06-06 NOTE — Telephone Encounter (Signed)
Left message for pt to call.

## 2021-06-06 NOTE — Telephone Encounter (Signed)
Patient is returning call.  °

## 2021-06-07 ENCOUNTER — Telehealth: Payer: Self-pay | Admitting: Cardiology

## 2021-06-07 NOTE — Telephone Encounter (Signed)
Pt c/o of Chest Pain: 1. Are you having CP right now? No  2. Are you experiencing any other symptoms (ex. SOB, nausea, vomiting, sweating)? No  3. How long have you been experiencing CP? A few days  4. Is your CP continuous or coming and going? Coming and going 5. Have you taken Nitroglycerin? Yes     Patient left a mychart message as well

## 2021-06-07 NOTE — Telephone Encounter (Addendum)
Returned call to patient, who has complaints of  intermittent Left sided chest pain. Patient states that the pain comes and goes, along with shortness of breath with activity. Patient currently does not have any symptoms. Advised patient that she would need to be seen sooner with APP. Made patient an appointment to see Edd Fabian- NP on Friday 7/8 at 3:15pm.    Made patient aware of ED precautions should new or worsening symptoms develop. Patient verbalized understanding.

## 2021-06-07 NOTE — Telephone Encounter (Signed)
Pt calling today to report periods of intermittent left chest wall pain and pain between her shoulder blades. She states yesterday she could not walk more than 10 feet without the pain. She took nitro which relieved it. She is not having any chest pain currently, but is concerned because it has been worsening and becoming more frequent. She understands she has blockages that could not be stented and wants to make sure this chest pain isn't abnormal.   I advised pt that I would forward to Dr. Jens Som and his RN for review as he may want her to come into the office sooner than her next appointment in September. ER and 911 precautions were reviewed with her as well.

## 2021-06-08 NOTE — Progress Notes (Signed)
Cardiology Clinic Note   Patient Name: Tara Campbell Date of Encounter: 06/09/2021  Primary Care Provider:  Hali Marry, MD Primary Cardiologist:  Kirk Ruths, MD  Patient Profile    Tara Campbell presents to the clinic today for an evaluation of her intermittent left-sided chest pain and shortness of breath.  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   Otomycosis of right ear 09/27/2017   Paroxysmal atrial fibrillation ([place])    Thyroid disease    Past Surgical History:  Procedure Laterality Date   ANGIOPLASTY     COLONOSCOPY W/ POLYPECTOMY  01/04/2015   HEMORRHOID BANDING     LEFT HEART CATH AND CORONARY ANGIOGRAPHY N/A 04/26/2021   Procedure: LEFT HEART CATH AND CORONARY ANGIOGRAPHY;  Surgeon: Sherren Mocha, MD;  Location: [facility];  Service: Cardiovascular;  Laterality: N/A;    Allergies  Allergies  Allergen Reactions   Latex Hives   Sulfa Antibiotics Rash    History of Present Illness    Tara Campbell has a PMH of paroxysmal atrial fibrillation, essential hypertension, type 2 diabetes, and NSTEMI.  She had a previous cardiac catheterization with PCI to her RCA.  She presented to the emergency department after being found to have elevated troponins in the 1000's.  She noted that she developed chest pain over the last several days.  She described her pain as burning chest pressure in the central portion of her chest.  She does notice that it worsened after eating and occurred several times.  She reported some nausea and one episode of vomiting.  She wanted to be evaluated by her PCP.  She was sent for lab work and EKG.  Her EKG showed  nonspecific T wave inversion.  Her outpatient lab work showed an unremarkable CBC with hemoglobin 13.2 and white count of 10.3.  Her creatinine was 1.15 with a troponin of 1021.  She presented to the emergency department and was not having active chest pain.  She reported intermittent episodes throughout the last 24-48 hours.  Her repeat labs showed a creatinine of 1.26 and a troponin of 379.   Cardiology was consulted for evaluation due to troponin downtrend.  Her lipid panel 4/22 showed a total cholesterol of 266 with an LDL 165.  Her A1c was 8.7.  She was admitted with plans of cardiac catheterization.  She underwent cardiac catheterization 04/26/2021 which showed severe complex stenosis of her LAD in the proximal mid and distal portion of the vessel.  Patent left circumflex with no significant stenosis, patent left main with no significant stenosis, patent RCA with RCA stents mild nonobstructive disease less than 50% stenosis and normal LVEDP.  Her LAD stenosis was unfavorable for PCI due to heavy calcification and marked tortuosity.  There is also noted to be a small caliber vessel.  Medical therapy was recommended.   She presented to the clinic 05/16/2021 for follow-up evaluation stated she felt well.  She had been increasing her physical activity and was walking 10 minutes twice a day every other day.  She had been eating  a heart healthy diet.  She presented to the clinic with her sister who is a retired Therapist, sports.  We discussed the importance of lowering her cholesterol.  She had a visit scheduled with the lipid clinic next week.  We discussed her anginal equivalent symptoms.  With her first cardiac catheterization she had pain/pressure between her shoulder blades.  With this episode she noticed a sensation that a pill was caught in her throat for several days and then had vomiting.  She has had 1 episode of chest discomfort that happened about 3 days after being discharged from the hospital.  She did take 1  nitroglycerin at that time and has not had any chest discomfort with increased physical activity since that time.  I asked her  to maintain her physical activity/increase as tolerated, gave the salty 6 diet sheet, ordered lipid and liver in 4 weeks, and planned her follow-up in 3 months with Dr. Stanford Breed.  She contacted the nurse triage line on 06/07/2021 with complaints of intermittent left-sided chest pain and shortness of breath.   She presents the clinic today for follow-up evaluation and states she has had 2 separate episodes of pain between her shoulder blades and intermittent left-sided chest pain with shortness of breath.  She reports that her pain dissipates with use of sublingual nitroglycerin.  She reports that her second episode of chest discomfort was not like her previous " pill caught in the throat sensation or pain between her shoulder blades".  She has been reserved with increasing her physical activity due to her shortness of breath.  She does report that she is anxious about increasing her physical activity.  We discussed her previous angiography, her anginal equivalent symptoms, and trialing isosorbide.  I will have her increase her physical activity as tolerated, start Imdur, and follow-up with Dr. Stanford Breed as scheduled.  Today she denies chest pain, shortness of breath, lower extremity edema, fatigue, palpitations, melena, hematuria, hemoptysis, diaphoresis, weakness, presyncope, syncope, orthopnea, and PND.  Home Medications    Prior to Admission medications   Medication Sig Start Date End Date Taking? Authorizing Provider  acetaminophen (TYLENOL) 500 MG tablet Take 1,000 mg by mouth every 6 (six) hours as needed for mild pain.    [provider]  albuterol (VENTOLIN HFA) 108 (90 Base) MCG/ACT inhaler Inhale 2 puffs into the lungs every 6 (six) hours as needed for wheezing. 11/16/19   Hali Marry, MD  allopurinol (ZYLOPRIM) 300 MG tablet Take 1 tablet (300 mg  total) by mouth 2 (two) times daily. Patient taking differently: Take 300 mg by mouth daily. 03/24/21   Hali Marry, MD  apixaban (ELIQUIS) 5 MG TABS tablet Take 1 tablet (5 mg total) by mouth 2 (two) times daily. 03/24/21   Hali Marry, MD  atenolol (TENORMIN) 100 MG tablet Take 1 tablet (100 mg total) by mouth daily. 03/24/21   Hali Marry, MD  atorvastatin (LIPITOR) 80 MG tablet Take 1 tablet (80 mg total) by mouth at bedtime. 03/24/21   Hali Marry, MD  blood glucose meter kit and supplies KIT Check morning fasting blood glucose daily and up to 4 times daily as directed 04/03/18   [name], PA-C  clopidogrel (PLAVIX) 75 MG tablet Take 1 tablet (75 mg total) by mouth daily. 04/27/21   Cheryln Manly, NP  Cyanocobalamin (B-12 PO) Take 1,000 mcg by mouth daily.    [provider]  EPINEPHrine 0.3 mg/0.3 mL IJ SOAJ injection Inject 0.3  mg into the muscle as needed for anaphylaxis. Call 9-1-1 after use. 03/06/21   Mannam, Hart Robinsons, MD  Evolocumab with Infusor ([place]) 420 MG/3.5ML SOCT Inject 420 mg into the skin every 30 (thirty) days. 05/29/21   Lelon Perla, MD  ezetimibe (ZETIA) 10 MG tablet Take 1 tablet (10 mg total) by mouth daily. 04/27/21   Cheryln Manly, NP  fluticasone (FLONASE) 50 MCG/ACT nasal spray Place 1 spray into both nostrils daily. 08/25/18   [name], PA-C  fluticasone furoate-vilanterol (BREO ELLIPTA) 200-25 MCG/INH AEPB Inhale 1 puff into the lungs daily. Needs appt for further refills 09/28/20   Parrett, [name] S, NP  glucose blood test strip To be used twice daily for testing blood sugars. E11.65 04/03/21   Hali Marry, MD  ipratropium-albuterol (DUONEB) 0.5-2.5 (3) MG/3ML SOLN Take 3 mLs by nebulization every 6 (six) hours as needed. Patient taking differently: Take 3 mLs by nebulization every 6 (six) hours as needed (shortness of breath). 01/25/20   Hali Marry, MD  irbesartan-hydrochlorothiazide (AVALIDE) 300-12.5 MG tablet Take 1 tablet by mouth daily. Patient taking differently: Take 1 tablet by mouth daily. Take 1/2 tablet daily 03/24/21   Hali Marry, MD  levalbuterol [facility] HFA) 45 MCG/ACT inhaler Inhale 1-2 puffs into the lungs every 6 (six) hours as needed for wheezing or shortness of breath. 01/06/17   [provider]  levothyroxine (SYNTHROID) 112 MCG tablet Take 1 tablet (112 mcg total) by mouth daily before breakfast. 03/24/21   Hali Marry, MD  Mepolizumab ([name]) 100 MG/ML SOAJ Inject 1 mL (100 mg total) into the skin every 28 (twenty-eight) days. 03/08/21   Marshell Garfinkel, MD  metFORMIN (GLUCOPHAGE) 500 MG tablet Take 2 tablets (1,000 mg total) by mouth 2 (two) times daily with a meal. 04/24/21 07/23/21  Hali Marry, MD  nitroGLYCERIN (NITROSTAT) 0.4 MG SL tablet Place 1 tablet (0.4 mg total) under the tongue every 5 (five) minutes as needed for chest pain (or tightness). 04/03/21   Hali Marry, MD  Omega-3 Fatty Acids (FISH OIL PO) Take 1,000 Units by mouth daily.    [provider]  SPIRIVA RESPIMAT 2.5 MCG/ACT AERS Inhale 2 puffs into the lungs daily. 11/18/20   Parrett, Fonnie Mu, NP    Family History    Family History  Problem Relation Age of Onset   Hypertension Mother    Hyperlipidemia Mother    Glaucoma Father    Heart disease Father    Hypertension Father    Diabetes Father    Hyperlipidemia Father    Skin cancer Father    Diabetes Paternal Grandmother    Colon cancer Neg Hx    Esophageal cancer Neg Hx    She indicated that her mother is deceased. She indicated that her father is deceased. She indicated that her sister is alive. She indicated that the status of her paternal grandmother is unknown. She indicated that the status of her neg hx is unknown.  Social History    Social History   Socioeconomic History   Marital status: Divorced    Spouse name:  Not on file   Number of children: 2   Years of education: 13.5   Highest education level: 12th grade  Occupational History    Comment: retired  Tobacco Use   Smoking status: Never   Smokeless tobacco: Never  Vaping Use   Vaping Use: Never used  Substance and Sexual Activity   Alcohol use:  Yes    Comment: Rare   Drug use: No   Sexual activity: Not Currently  Other Topics Concern   Not on file  Social History Narrative   Lives alone. Likes to crafts and paint in her free time.   Social Determinants of Health   Financial Resource Strain: Low Risk    Difficulty of Paying Living Expenses: Not hard at all  Food Insecurity: No Food Insecurity   Worried About Charity fundraiser in the Last Year: Never true   [place] in the Last Year: Never true  Transportation Needs: No Transportation Needs   Lack of Transportation (Medical): No   Lack of Transportation (Non-Medical): No  Physical Activity: Inactive   Days of Exercise per Week: 0 days   Minutes of Exercise per Session: 0 min  Stress: No Stress Concern Present   Feeling of Stress : Not at all  Social Connections: Moderately Isolated   Frequency of Communication with Friends and Family: More than three times a week   Frequency of Social Gatherings with Friends and Family: More than three times a week   Attends Religious Services: Never   Marine scientist or Organizations: Yes   Attends Music therapist: More than 4 times per year   Marital Status: Divorced  Human resources officer Violence: Not At Risk   Fear of Current or Ex-Partner: No   Emotionally Abused: No   Physically Abused: No   Sexually Abused: No     Review of Systems    General:  No chills, fever, night sweats or weight changes.  Cardiovascular:  No chest pain, dyspnea on exertion, edema, orthopnea, palpitations, paroxysmal nocturnal dyspnea. Dermatological: No rash, lesions/masses Respiratory: No cough, dyspnea Urologic: No hematuria,  dysuria Abdominal:   No nausea, vomiting, diarrhea, bright red blood per rectum, melena, or hematemesis Neurologic:  No visual changes, wkns, changes in mental status. All other systems reviewed and are otherwise negative except as noted above.  Physical Exam    VS:  BP 120/68 (BP Location: Left Arm, Patient Position: Sitting, Cuff Size: Large)   Pulse 77   Ht _0  (1.626 m)   Wt 191 lb 6.4 oz (86.8 kg)   SpO2 97%   BMI 32.85 kg/m  , BMI Body mass index is 32.85 kg/m. GEN: Well nourished, well developed, in no acute distress. HEENT: normal. Neck: Supple, no JVD, carotid bruits, or masses. Cardiac: RRR, no murmurs, rubs, or gallops. No clubbing, cyanosis, edema.  Radials/DP/PT 2+ and equal bilaterally.  Respiratory:  Respirations regular and unlabored, clear to auscultation bilaterally. GI: Soft, nontender, nondistended, BS + x 4. MS: no deformity or atrophy. Skin: warm and dry, no rash. Neuro:  Strength and sensation are intact. Psych: Normal affect.  Accessory Clinical Findings    Recent Labs: 04/24/2021: ALT 17; TSH 2.65 04/27/2021: BUN 16; Creatinine, Ser 1.11; Hemoglobin 10.9; Platelets 265; Potassium 4.2; Sodium 135   Recent Lipid Panel    Component Value Date/Time   CHOL 240 (H) 04/26/2021 0449   TRIG 248 (H) 04/26/2021 0449   HDL 47 04/26/2021 0449   CHOLHDL 5.1 04/26/2021 0449   VLDL 50 (H) 04/26/2021 0449   LDLCALC 143 (H) 04/26/2021 0449   LDLCALC 165 (H) 03/22/2021 0000    ECG personally reviewed by me today-sinus rhythm with first-degree AV block low voltage QRS 74 bpm no acute changes.  EKG 05/16/2021 sinus rhythm with first-degree AV block low voltage QRS 72 bpm- No acute changes  Echocardiogram 04/26/2021 IMPRESSIONS     1. Left ventricular ejection fraction, by estimation, is 60 to 65%. The  left ventricle has normal function. The left ventricle has no regional  wall motion abnormalities. There is mild concentric left ventricular  hypertrophy.  Left ventricular diastolic  parameters are consistent with Grade I diastolic dysfunction (impaired  relaxation). Elevated left atrial pressure.   2. Right ventricular systolic function is normal. The right ventricular  size is normal.   3. Left atrial size was moderately dilated.   4. The mitral valve is normal in structure. No evidence of mitral valve  regurgitation. No evidence of mitral stenosis.   5. The aortic valve is normal in structure. Aortic valve regurgitation is  not visualized. No aortic stenosis is present.   6. The inferior vena cava is normal in size with greater than 50%  respiratory variability, suggesting right atrial pressure of 3 mmHg.   Comparison(s): No significant change from prior study. Prior images  reviewed side by side.   Cardiac catheterization 04/26/2021 1.  Severe complex stenosis of the LAD in the proximal, mid, and distal vessel. 2.  Patent left circumflex no significant stenosis 3.  Patent left main with no significant stenosis 4.  Patent RCA with patent RCA stents, mild nonobstructive disease present with less than 50% stenosis 5.  Normal LVEDP   Recommendations: The patient has severe diffuse LAD stenoses.  The vessel is unfavorable for PCI with heavy calcification, marked tortuosity, and small vessel caliber.  I would favor medical therapy as an initial approach.  If she fails medical therapy would be reasonable to attempt PCI of the proximal and mid vessel with technical concerns noted above   Diagnostic Dominance: Right          Assessment & Plan   1.  NSTEMI-no chest pain today.  Contacted nurse triage line 06/07/2021 with complaints of intermittent left-sided chest pain and shortness of breath.  Underwent cardiac catheterization 04/26/2021 and was noted to have a heavily calcified tortuous, severely stenosed proximal mid and distal LAD.  Due to tortuosity condition medical management was recommended.  She was placed on Plavix Eliquis and her  atorvastatin, atenolol, and HCTZ were continued.  Echocardiogram showed EF 60-65% with no regional wall motion abnormalities, G1 DD, and mildly dilated LA. Continue atorvastatin, atenolol, Eliquis, HCTZ, clopidogrel, irbesartan Heart healthy low-sodium diet-salty 6 given Maintain physical activity as tolerated Start Imdur 30 mg daily  Essential hypertension-BP today 120/68.   Continue irbesartan, HCTZ, atenolol Heart healthy low-sodium diet-salty 6 given Increase physical activity as tolerated  Hyperlipidemia-04/26/2021: Cholesterol 240; HDL 47; LDL Cholesterol 143; Triglycerides 248; VLDL 50 Continue atorvastatin, ezetimibe Heart healthy low-sodium high-fiber diet Increase physical activity as tolerated Follows with lipid clinic   Paroxysmal atrial fibrillation-heart rate today 77 bpm.    Previously noticed in the postoperative setting several years ago. Continue Eliquis Heart healthy low-sodium diet-salty 6 given Increase physical activity as tolerated   Asthma-no increased shortness of breath.  Follows with pulmonology Continue current medical therapy   Disposition: Follow-up with Dr. Stanford Breed in 2 months   Jossie Ng. Chayse Zatarain NP-C    06/09/2021, 3:29 PM [address] Office [phone] Fax [phone]  Notice: This dictation was prepared with Dragon dictation along with smaller phrase technology. Any transcriptional errors that result from this process are unintentional and may not be corrected upon review.  I spent 13 minutes examining this patient, reviewing medications, and using patient centered  shared decision making involving her cardiac care.  Prior to her visit I spent greater than 20 minutes reviewing her past medical history,  medications, and prior cardiac tests.

## 2021-06-09 ENCOUNTER — Encounter: Payer: Self-pay | Admitting: General Practice

## 2021-06-09 ENCOUNTER — Ambulatory Visit: Payer: PPO | Admitting: General Practice

## 2021-06-09 ENCOUNTER — Other Ambulatory Visit: Payer: Self-pay

## 2021-06-09 VITALS — BP 120/68 | HR 77 | Ht 64.0 in | Wt 191.4 lb

## 2021-06-09 DIAGNOSIS — I10 Essential (primary) hypertension: Secondary | ICD-10-CM | POA: Diagnosis not present

## 2021-06-09 DIAGNOSIS — I480 Paroxysmal atrial fibrillation: Secondary | ICD-10-CM

## 2021-06-09 DIAGNOSIS — I214 Non-ST elevation (NSTEMI) myocardial infarction: Secondary | ICD-10-CM | POA: Diagnosis not present

## 2021-06-09 DIAGNOSIS — J4520 Mild intermittent asthma, uncomplicated: Secondary | ICD-10-CM

## 2021-06-09 DIAGNOSIS — E782 Mixed hyperlipidemia: Secondary | ICD-10-CM | POA: Diagnosis not present

## 2021-06-09 MED ORDER — ISOSORBIDE MONONITRATE ER 30 MG PO TB24: 30.0000 mg | ORAL_TABLET | Freq: Every day | ORAL | 3 refills | Status: DC

## 2021-06-09 NOTE — Patient Instructions (Signed)
Medication Instructions:  START IMDUR 30MG  DAILY  *If you need a refill on your cardiac medications before your next appointment, please call your pharmacy*  Lab Work:   Testing/Procedures:  NONE    NON  Special Instructions PLEASE INCREASE PHYSICAL ACTIVITY SLOWLY- AS TOLERATED  Follow-Up: Your next appointment:  KEEP SCHEDULED  APPOINTMENT  In Person with , MD  At [facility], you and your health needs are our priority.  As part of our continuing mission to provide you with exceptional heart care, we have created designated Provider Care Teams.  These Care Teams include your primary Cardiologist (physician) and Advanced Practice Providers (APPs -  Physician Assistants and Nurse Practitioners) who all work together to provide you with the care you need, when you need it.

## 2021-06-14 ENCOUNTER — Telehealth: Payer: Self-pay | Admitting: Family Medicine

## 2021-06-14 NOTE — Progress Notes (Signed)
  Chronic Care Management   Outreach Note  06/14/2021 Name: Tara Campbell MRN: [id number] DOB: 12-05-1945  Referred by: Agapito Games, MD Reason for referral : No chief complaint on file.   An unsuccessful telephone outreach was attempted today. The patient was referred to the pharmacist for assistance with care management and care coordination.   Follow Up Plan:   Carmell [place] Upstream Scheduler

## 2021-06-14 NOTE — Chronic Care Management (AMB) (Signed)
  Chronic Care Management   Note  06/14/2021 Name: NIMA BAMBURG MRN: [id number] DOB: 15-Nov-1946  REHANNA OLOUGHLIN is a 75 y.o. year old female who is a primary care patient of Linford Arnold, Barbarann Ehlers, MD. I reached out to [place] A[place]as by phone today in response to a referral sent by Ms. Dyke Brackett [place]'s PCP, Agapito Games, MD.   Ms. Royce was given information about Chronic Care Management services today including:  CCM service includes personalized support from designated clinical staff supervised by her physician, including individualized plan of care and coordination with other care providers 24/7 contact phone numbers for assistance for urgent and routine care needs. Service will only be billed when office clinical staff spend 20 minutes or more in a month to coordinate care. Only one practitioner may furnish and bill the service in a calendar month. The patient may stop CCM services at any time (effective at the end of the month) by phone call to the office staff.   Patient agreed to services and verbal consent obtained.   Follow up plan:   Carmell Austria Upstream Scheduler

## 2021-06-19 MED ORDER — ISOSORBIDE MONONITRATE ER 60 MG PO TB24: 60.0000 mg | ORAL_TABLET | Freq: Every day | ORAL | 1 refills | Status: DC

## 2021-06-22 ENCOUNTER — Other Ambulatory Visit: Payer: Self-pay | Admitting: Cardiology

## 2021-06-23 ENCOUNTER — Ambulatory Visit (INDEPENDENT_AMBULATORY_CARE_PROVIDER_SITE_OTHER): Payer: PPO | Admitting: Family Medicine

## 2021-06-23 ENCOUNTER — Encounter: Payer: Self-pay | Admitting: Family Medicine

## 2021-06-23 ENCOUNTER — Encounter (HOSPITAL_COMMUNITY): Payer: Self-pay

## 2021-06-23 ENCOUNTER — Telehealth: Payer: Self-pay | Admitting: Family Medicine

## 2021-06-23 ENCOUNTER — Telehealth (HOSPITAL_COMMUNITY): Payer: Self-pay

## 2021-06-23 ENCOUNTER — Other Ambulatory Visit: Payer: Self-pay

## 2021-06-23 VITALS — BP 102/42 | HR 72 | Ht 64.0 in | Wt 189.0 lb

## 2021-06-23 DIAGNOSIS — J4550 Severe persistent asthma, uncomplicated: Secondary | ICD-10-CM | POA: Diagnosis not present

## 2021-06-23 DIAGNOSIS — I209 Angina pectoris, unspecified: Secondary | ICD-10-CM

## 2021-06-23 DIAGNOSIS — E118 Type 2 diabetes mellitus with unspecified complications: Secondary | ICD-10-CM

## 2021-06-23 NOTE — Assessment & Plan Note (Signed)
A1c looks much better today at 7.0.  Down from 8.8.  She has made some great changes and has been tolerating the metformin well she has been mostly getting blood sugars in the 140s at home. Could increase Ozempic dose but will need to get covered since she is in the Medicare Gap.

## 2021-06-23 NOTE — Assessment & Plan Note (Signed)
We will see if have samples of Telegy and see if we can help with patient assitance.

## 2021-06-23 NOTE — Assessment & Plan Note (Signed)
She is doing better in the last couple days with the increase of the isosorbide.  Encouraged her to give it a full 2 weeks to reach efficacy but to continue to monitor for low blood pressures as well as lightheadedness and dizziness.

## 2021-06-23 NOTE — Telephone Encounter (Signed)
Attempted to call patient in regards to Cardiac Rehab - LM on VM Mailed letter 

## 2021-06-23 NOTE — Progress Notes (Signed)
Established Patient Office Visit  Subjective:  Patient ID: Tara Campbell, female    DOB: December 16, 1945  Age: 75 y.o. MRN: [id number]  CC:  Chief Complaint  Patient presents with   Diabetes    A1c=7.0%    HPI Tara Campbell presents for   Diabetes - no hypoglycemic events. No wounds or sores that are not healing well. No increased thirst or urination. Checking glucose at home. Numbers in the 140s.  Taking medications as prescribed without any side effects.  F/U asthma  - currently on spiriva and Breo.  She has now his her medicare gap.  She is also been having some angina.  She was started on isosorbide 30 mg.  She has been having chest pain in the afternoon almost daily so she has been using her nitro for rescue in the afternoons she did contact the cardiologist several days ago and they recommended going up to isosorbide 60 mg and did send a new prescription for her.  She has been tracking her blood pressures and they have been going a bit lower.  She has had a couple episodes where she felt a little lightheaded when she got up quickly but nothing that persisted.  She does feel like its been a little bit more helpful with the chest pain even though it is not completely gone but she has not had to use her nitro since bumping up her isosorbide dose.  She does have a follow-up coming up in September.  They have suggested cardiac rehab but she has had difficulty getting that scheduled.  She did bring in a blood pressure log and they have been running in the upper teens over 60s.  Pulses been in the 90s.  Hyperlipidemia-she has not even started the Repatha yet because of the cost and trying to get it approved.   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   Otomycosis of right ear 09/27/2017   Paroxysmal atrial fibrillation ([place])    Thyroid disease     Past Surgical History:  Procedure Laterality Date   ANGIOPLASTY     COLONOSCOPY W/ POLYPECTOMY  01/04/2015   HEMORRHOID BANDING     LEFT HEART CATH AND CORONARY ANGIOGRAPHY N/A 04/26/2021   Procedure: LEFT HEART CATH AND CORONARY ANGIOGRAPHY;  Surgeon: Sherren Mocha, MD;  Location: [facility];  Service: Cardiovascular;  Laterality: N/A;    Family History  Problem Relation Age of Onset   Hypertension Mother    Hyperlipidemia Mother    Glaucoma Father    Heart disease Father    Hypertension Father    Diabetes Father    Hyperlipidemia Father    Skin cancer Father    Diabetes Paternal Grandmother    Colon cancer Neg Hx    Esophageal cancer Neg Hx     Social History   Socioeconomic History   Marital status: Divorced    Spouse name: Not on file   Number of children: 2   Years of education: 13.5   Highest education level: 12th grade  Occupational History    Comment: retired  Tobacco Use  Smoking status: Never   Smokeless tobacco: Never  Vaping Use   Vaping Use: Never used  Substance and Sexual Activity   Alcohol use: Yes    Comment: Rare   Drug use: No   Sexual activity: Not Currently  Other Topics Concern   Not on file  Social History Narrative   Lives alone. Likes to crafts and paint in her free time.   Social Determinants of Health   Financial Resource Strain: Low Risk    Difficulty of Paying Living Expenses: Not hard at all  Food Insecurity: No Food Insecurity   Worried About Charity fundraiser in the Last Year: Never true   [place] in the Last Year: Never true  Transportation Needs: No Transportation Needs   Lack of Transportation (Medical): No   Lack of Transportation (Non-Medical): No  Physical Activity: Inactive   Days of Exercise per Week: 0 days   Minutes of Exercise per Session: 0 min   Stress: No Stress Concern Present   Feeling of Stress : Not at all  Social Connections: Moderately Isolated   Frequency of Communication with Friends and Family: More than three times a week   Frequency of Social Gatherings with Friends and Family: More than three times a week   Attends Religious Services: Never   Marine scientist or Organizations: Yes   Attends Music therapist: More than 4 times per year   Marital Status: Divorced  Human resources officer Violence: Not At Risk   Fear of Current or Ex-Partner: No   Emotionally Abused: No   Physically Abused: No   Sexually Abused: No    Outpatient Medications Prior to Visit  Medication Sig Dispense Refill   acetaminophen (TYLENOL) 500 MG tablet Take 1,000 mg by mouth every 6 (six) hours as needed for mild pain.     albuterol (VENTOLIN HFA) 108 (90 Base) MCG/ACT inhaler Inhale 2 puffs into the lungs every 6 (six) hours as needed for wheezing. 18 g prn   allopurinol (ZYLOPRIM) 300 MG tablet Take 1 tablet (300 mg total) by mouth 2 (two) times daily. (Patient taking differently: Take 300 mg by mouth daily.) 180 tablet 1   apixaban (ELIQUIS) 5 MG TABS tablet Take 1 tablet (5 mg total) by mouth 2 (two) times daily. 180 tablet 1   atenolol (TENORMIN) 100 MG tablet Take 1 tablet (100 mg total) by mouth daily. 90 tablet 1   atorvastatin (LIPITOR) 80 MG tablet Take 1 tablet (80 mg total) by mouth at bedtime. 90 tablet 3   blood glucose meter kit and supplies KIT Check morning fasting blood glucose daily and up to 4 times daily as directed 1 each 0   clopidogrel (PLAVIX) 75 MG tablet Take 1 tablet (75 mg total) by mouth daily. 90 tablet 1   Cyanocobalamin (B-12 PO) Take 1,000 mcg by mouth daily.     EPINEPHrine 0.3 mg/0.3 mL IJ SOAJ injection Inject 0.3 mg into the muscle as needed for anaphylaxis. Call 9-1-1 after use. 1 each 2   Evolocumab with Infusor ([place]) 420 MG/3.5ML SOCT Inject 420 mg into the skin every 30  (thirty) days. 3.6 mL 11   ezetimibe (ZETIA) 10 MG tablet TAKE ONE TABLET BY MOUTH EVERY DAY 90 tablet 0   fluticasone (FLONASE) 50 MCG/ACT nasal spray Place 1 spray into both nostrils daily. 16 g 3   fluticasone furoate-vilanterol (BREO ELLIPTA) 200-25 MCG/INH AEPB Inhale 1 puff into the lungs daily. Needs  appt for further refills 60 each 6   glucose blood test strip To be used twice daily for testing blood sugars. E11.65 200 each 11   ipratropium-albuterol (DUONEB) 0.5-2.5 (3) MG/3ML SOLN Take 3 mLs by nebulization every 6 (six) hours as needed. (Patient taking differently: Take 3 mLs by nebulization every 6 (six) hours as needed (shortness of breath).) 3 mL PRN   irbesartan-hydrochlorothiazide (AVALIDE) 300-12.5 MG tablet Take 1 tablet by mouth daily. (Patient taking differently: Take 1 tablet by mouth daily. Take 1/2 tablet daily) 90 tablet 1   isosorbide mononitrate (IMDUR) 60 MG 24 hr tablet Take 1 tablet (60 mg total) by mouth daily. 90 tablet 1   levalbuterol (XOPENEX HFA) 45 MCG/ACT inhaler Inhale 1-2 puffs into the lungs every 6 (six) hours as needed for wheezing or shortness of breath.     levothyroxine (SYNTHROID) 112 MCG tablet Take 1 tablet (112 mcg total) by mouth daily before breakfast. 90 tablet 1   Mepolizumab ([name]) 100 MG/ML SOAJ Inject 1 mL (100 mg total) into the skin every 28 (twenty-eight) days. 1 mL 5   metFORMIN (GLUCOPHAGE) 500 MG tablet Take 2 tablets (1,000 mg total) by mouth 2 (two) times daily with a meal. 360 tablet 1   nitroGLYCERIN (NITROSTAT) 0.4 MG SL tablet Place 1 tablet (0.4 mg total) under the tongue every 5 (five) minutes as needed for chest pain (or tightness). 30 tablet prn   Omega-3 Fatty Acids (FISH OIL PO) Take 1,000 Units by mouth daily.     OZEMPIC, 0.25 OR 0.5 MG/DOSE, 2 MG/1.5ML SOPN Inject 0.5 mg into the skin once a week.     SPIRIVA RESPIMAT 2.5 MCG/ACT AERS Inhale 2 puffs into the lungs daily. 4 g 5   No facility-administered medications prior  to visit.    Allergies  Allergen Reactions   Latex Hives   Sulfa Antibiotics Rash    ROS Review of Systems    Objective:    Physical Exam Constitutional:      Appearance: Normal appearance. She is well-developed.  HENT:     Head: Normocephalic and atraumatic.  Cardiovascular:     Rate and Rhythm: Normal rate and regular rhythm.     Heart sounds: Normal heart sounds.  Pulmonary:     Effort: Pulmonary effort is normal.     Breath sounds: Normal breath sounds.  Skin:    General: Skin is warm and dry.  Neurological:     Mental Status: She is alert and oriented to person, place, and time.  Psychiatric:        Behavior: Behavior normal.    BP (!) 102/42   Pulse 72   Ht _0  (1.626 m)   Wt 189 lb 0.6 oz (85.7 kg)   SpO2 100%   BMI 32.45 kg/m  Wt Readings from Last 3 Encounters:  06/23/21 189 lb 0.6 oz (85.7 kg)  06/09/21 191 lb 6.4 oz (86.8 kg)  05/24/21 191 lb 6.4 oz (86.8 kg)     There are no preventive care reminders to display for this patient.  There are no preventive care reminders to display for this patient.  Lab Results  Component Value Date   TSH 2.65 04/24/2021   Lab Results  Component Value Date   WBC 7.2 04/27/2021   HGB 10.9 (L) 04/27/2021   HCT 32.4 (L) 04/27/2021   MCV 87.6 04/27/2021   PLT 265 04/27/2021   Lab Results  Component Value Date   NA 135 04/27/2021  K 4.2 04/27/2021   CO2 23 04/27/2021   GLUCOSE 208 (H) 04/27/2021   BUN 16 04/27/2021   CREATININE 1.11 (H) 04/27/2021   BILITOT 0.4 04/24/2021   ALKPHOS 100 05/29/2017   AST 19 04/24/2021   ALT 17 04/24/2021   PROT 7.1 04/24/2021   ALBUMIN 4.2 05/29/2017   CALCIUM 9.3 04/27/2021   ANIONGAP 9 04/27/2021   Lab Results  Component Value Date   CHOL 240 (H) 04/26/2021   Lab Results  Component Value Date   HDL 47 04/26/2021   Lab Results  Component Value Date   LDLCALC 143 (H) 04/26/2021   Lab Results  Component Value Date   TRIG 248 (H) 04/26/2021   Lab  Results  Component Value Date   CHOLHDL 5.1 04/26/2021   Lab Results  Component Value Date   HGBA1C 8.8 (H) 04/26/2021      Assessment & Plan:   Problem List Items Addressed This Visit       Cardiovascular and Mediastinum   Angina pectoris ([place])    She is doing better in the last couple days with the increase of the isosorbide.  Encouraged her to give it a full 2 weeks to reach efficacy but to continue to monitor for low blood pressures as well as lightheadedness and dizziness.         Respiratory   Severe persistent allergic asthma    We will see if have samples of Telegy and see if we can help with patient assitance.           Endocrine   Controlled diabetes mellitus type 2 with complications ([facility]) - Primary    A1c looks much better today at 7.0.  Down from 8.8.  She has made some great changes and has been tolerating the metformin well she has been mostly getting blood sugars in the 140s at home. Could increase Ozempic dose but will need to get covered since she is in the Medicare Gap.         Relevant Medications   OZEMPIC, 0.25 OR 0.5 MG/DOSE, 2 MG/1.5ML SOPN    No orders of the defined types were placed in this encounter.   Follow-up: Return in about 2 months (around 08/24/2021) for DM.    Beatrice Lecher, MD

## 2021-06-23 NOTE — Telephone Encounter (Signed)
Tara Campbell, can you please call Tara Campbell and get her on your schedule in the next week or 2 she has had her Medicare gap and is getting needs some help with several of her medication she just pay $800 to fill her prescriptions.  I would love to get her on Trelegy instead of separate Breo and Spiriva.  And it also really like to get her on a higher dose of Ozempic to get her A1c under better control if you could give her some help that would be fantastic.

## 2021-06-28 NOTE — Telephone Encounter (Signed)
Dr. Linford Arnold,   Would love to help - going to send to my scheduler to move up Ms. Thomason sooner onto my schedule.  Thanks, Thurston Hole

## 2021-06-30 ENCOUNTER — Telehealth: Payer: Self-pay | Admitting: Family Medicine

## 2021-06-30 NOTE — Telephone Encounter (Signed)
Patient had some questions about the appointment and wanted to reschedule.

## 2021-07-03 ENCOUNTER — Telehealth: Payer: PPO

## 2021-07-03 ENCOUNTER — Encounter: Payer: Self-pay | Admitting: Family Medicine

## 2021-07-03 DIAGNOSIS — Z13820 Encounter for screening for osteoporosis: Secondary | ICD-10-CM

## 2021-07-03 DIAGNOSIS — Z780 Asymptomatic menopausal state: Secondary | ICD-10-CM

## 2021-07-04 DIAGNOSIS — H40003 Preglaucoma, unspecified, bilateral: Secondary | ICD-10-CM | POA: Diagnosis not present

## 2021-07-05 ENCOUNTER — Ambulatory Visit (INDEPENDENT_AMBULATORY_CARE_PROVIDER_SITE_OTHER): Payer: PPO | Admitting: Pharmacist

## 2021-07-05 ENCOUNTER — Other Ambulatory Visit: Payer: Self-pay

## 2021-07-05 DIAGNOSIS — I480 Paroxysmal atrial fibrillation: Secondary | ICD-10-CM

## 2021-07-05 DIAGNOSIS — E1165 Type 2 diabetes mellitus with hyperglycemia: Secondary | ICD-10-CM

## 2021-07-05 DIAGNOSIS — I10 Essential (primary) hypertension: Secondary | ICD-10-CM

## 2021-07-05 DIAGNOSIS — E1169 Type 2 diabetes mellitus with other specified complication: Secondary | ICD-10-CM

## 2021-07-05 NOTE — Progress Notes (Signed)
Chronic Care Management Pharmacy Note  07/08/2021 Name:  Tara Campbell MRN:  [id number] DOB:  04/20/1946  Summary: addressed DM, HTN, HLD, Afib. Assessed pt finances, extensive research/discussion. Patient meets criteria for cost assistance for repatha and eliquis - applications in process. Unfortunately based upon income amount, patient is not eligible for Ozempic or trelegy programs. Patient does receive assistance with nucala, obtained from a grant.  Recommendations/Changes made from today's visit: increase ozempic to 52m daily. Also, completed application for repatha cost assistance, and patient is completing eliquis application.  Plan: f/u with pharmacist in 1 month  Subjective: Tara ALTIZERis an 75y.o. year old female who is a primary patient of Metheney, CRene Kocher MD.  The CCM team was consulted for assistance with disease management and care coordination needs.    Engaged with patient face to face for initial visit in response to provider referral for pharmacy case management and/or care coordination services.   Consent to Services:  The patient was given information about Chronic Care Management services, agreed to services, and gave verbal consent prior to initiation of services.  Please see initial visit note for detailed documentation.   Patient Care Team: MHali Marry MD as PCP - General (Family Medicine) CStanford BreedBDenice Bors MD as PCP - Cardiology (Cardiology) MJuanito Doom MD as Consulting Physician (Pulmonary Disease) CStanford BreedBDenice Bors MD as Consulting Physician (Cardiology) KDarius Bump [facility] (Pharmacist)  Recent office visits: 06/23/21 - CBeatrice Lecher(PCP) - controlled DM, asthma, angina pectoris: coverage gap and identified medication access concerns  Recent consult visits: 06/09/21 - JColetta Memos NP (cardiology) - L sided chest pain and NSTEMI f/u  Hospital visits: Admitted to the hospital on 04/25/21 due to NSTEMI.  Discharge date was 04/27/21. [facility]    Medication Changes: -Started clopidogrel in addition to eliquis due to medical management preferred course for NSTEMI -All other medications will remain the same.    Objective:  Lab Results  Component Value Date   CREATININE 1.11 (H) 04/27/2021   CREATININE 1.05 (H) 04/26/2021   CREATININE 1.26 (H) 04/25/2021    Lab Results  Component Value Date   HGBA1C 8.8 (H) 04/26/2021   Last diabetic Eye exam:  Lab Results  Component Value Date/Time   HMDIABEYEEXA No Retinopathy 04/05/2021 12:00 AM    Last diabetic Foot exam: No results found for: HMDIABFOOTEX      Component Value Date/Time   CHOL 240 (H) 04/26/2021 0449   TRIG 248 (H) 04/26/2021 0449   HDL 47 04/26/2021 0449   CHOLHDL 5.1 04/26/2021 0449   VLDL 50 (H) 04/26/2021 0449   LDLCALC 143 (H) 04/26/2021 0449   LDLCALC 165 (H) 03/22/2021 0000    Hepatic Function Latest Ref Rng & Units 04/24/2021 03/22/2021 10/09/2019  Total Protein 6.1 - 8.1 g/dL 7.1 7.0 6.6  Albumin 3.6 - 5.1 g/dL - - -  AST 10 - 35 U/L _0 ALT 6 - 29 U/L _1 Alk Phosphatase 33 - 130 U/L - - -  Total Bilirubin 0.2 - 1.2 mg/dL 0.4 0.5 0.6    Lab Results  Component Value Date/Time   TSH 2.65 04/24/2021 12:00 AM   TSH 3.15 03/22/2021 12:00 AM    CBC Latest Ref Rng & Units 04/27/2021 04/26/2021 04/25/2021  WBC 4.0 - 10.5 K/uL 7.2 9.0 8.8  Hemoglobin 12.0 - 15.0 g/dL 10.9(L) 12.4 12.6  Hematocrit 36.0 - 46.0 % 32.4(L) 37.2 37.5  Platelets 150 - 400 K/uL 265 279 305     Social History   Tobacco Use  Smoking Status Never  Smokeless Tobacco Never   BP Readings from Last 3 Encounters:  06/23/21 (!) 102/42  06/09/21 120/68  05/24/21 126/60   Pulse Readings from Last 3 Encounters:  06/23/21 72  06/09/21 77  05/24/21 86   Wt Readings from Last 3 Encounters:  06/23/21 189 lb 0.6 oz (85.7 kg)  06/09/21 191 lb 6.4 oz (86.8 kg)  05/24/21 191 lb 6.4 oz (86.8  kg)    Assessment: Review of patient past medical history, allergies, medications, health status, including review of consultants reports, laboratory and other test data, was performed as part of comprehensive evaluation and provision of chronic care management services.   SDOH:  (Social Determinants of Health) assessments and interventions performed:    CCM Care Plan  Allergies  Allergen Reactions   Latex Hives   Sulfa Antibiotics Rash    Medications Reviewed Today     Reviewed by Darius Bump, [facility] (Pharmacist) on 07/08/21 at [place] List Status: <None>   Medication Order Taking? Sig Documenting Provider Last Dose Status Informant  acetaminophen (TYLENOL) 500 MG tablet [id number] No Take 1,000 mg by mouth every 6 (six) hours as needed for mild pain.  Patient not taking: Reported on 07/05/2021   [provider] Not Taking Active Self  albuterol (VENTOLIN HFA) 108 (90 Base) MCG/ACT inhaler [id number] Yes Inhale 2 puffs into the lungs every 6 (six) hours as needed for wheezing. Hali Marry, MD Taking Active Self  allopurinol (ZYLOPRIM) 300 MG tablet [id number] Yes Take 1 tablet (300 mg total) by mouth 2 (two) times daily.  Patient taking differently: Take 300 mg by mouth daily.   Hali Marry, MD Taking Active   apixaban (ELIQUIS) 5 MG TABS tablet [id number] Yes Take 1 tablet (5 mg total) by mouth 2 (two) times daily. Hali Marry, MD Taking Active Self  atenolol (TENORMIN) 100 MG tablet [id number] Yes Take 1 tablet (100 mg total) by mouth daily. Hali Marry, MD Taking Active Self  atorvastatin (LIPITOR) 80 MG tablet [id number] Yes Take 1 tablet (80 mg total) by mouth at bedtime. Hali Marry, MD Taking Active Self  blood glucose meter kit and supplies KIT [id number] Yes Check morning fasting blood glucose daily and up to 4 times daily as directed [name], PA-C Taking Active Self  clopidogrel (PLAVIX) 75 MG tablet  [id number] Yes Take 1 tablet (75 mg total) by mouth daily. Cheryln Manly, NP Taking Active   Cyanocobalamin (B-12 PO) [id number] Yes Take 1,000 mcg by mouth daily. [provider] Taking Active Self  EPINEPHrine 0.3 mg/0.3 mL IJ SOAJ injection [id number] Yes Inject 0.3 mg into the muscle as needed for anaphylaxis. Call 9-1-1 after use. Marshell Garfinkel, MD Taking Active Self  Evolocumab with Infusor ([place]) 420 MG/3.5ML SOCT [id number] Yes Inject 420 mg into the skin every 30 (thirty) days. Lelon Perla, MD Taking Active   ezetimibe (ZETIA) 10 MG tablet [id number] Yes TAKE ONE TABLET BY MOUTH EVERY DAY Stanford Breed Denice Bors, MD Taking Active   fluticasone (FLONASE) 50 MCG/ACT nasal spray [id number] Yes Place 1 spray into both nostrils daily. [name], PA-C Taking Active Self  fluticasone furoate-vilanterol (BREO ELLIPTA) 200-25 MCG/INH AEPB [id number] Yes Inhale 1 puff into the lungs daily. Needs appt for further refills Parrett, Fonnie Mu, NP Taking Active Self  Fluticasone-Umeclidin-Vilant (TRELEGY ELLIPTA) 100-62.5-25  MCG/INH AEPB [id number] No Inhale 1 puff into the lungs daily. HAS NOT STARTED YET, finishing RX of breo and spiriva.  Patient not taking: Reported on 07/08/2021   [provider] Not Taking Active   glucose blood test strip [id number] Yes To be used twice daily for testing blood sugars. E11.65 Hali Marry, MD Taking Active Self  ipratropium-albuterol (DUONEB) 0.5-2.5 (3) MG/3ML SOLN [id number] Yes Take 3 mLs by nebulization every 6 (six) hours as needed.  Patient taking differently: Take 3 mLs by nebulization every 6 (six) hours as needed (shortness of breath).   Hali Marry, MD Taking Active Self  irbesartan-hydrochlorothiazide (AVALIDE) 300-12.5 MG tablet [id number] Yes Take 1 tablet by mouth daily.  Patient taking differently: Take 1 tablet by mouth daily. Take 1/2 tablet daily   Hali Marry, MD Taking  Active   isosorbide mononitrate (IMDUR) 60 MG 24 hr tablet [id number] Yes Take 1 tablet (60 mg total) by mouth daily. Lelon Perla, MD Taking Active   levalbuterol [facility] HFA) 45 MCG/ACT inhaler [id number] Yes Inhale 1-2 puffs into the lungs every 6 (six) hours as needed for wheezing or shortness of breath. [provider] Taking Active Self  levothyroxine (SYNTHROID) 112 MCG tablet [id number] Yes Take 1 tablet (112 mcg total) by mouth daily before breakfast. Hali Marry, MD Taking Active Self  Mepolizumab ([name]) 100 MG/ML Darden Palmer [id number] Yes Inject 1 mL (100 mg total) into the skin every 28 (twenty-eight) days. Marshell Garfinkel, MD Taking Active Self  metFORMIN (GLUCOPHAGE) 500 MG tablet [id number] Yes Take 2 tablets (1,000 mg total) by mouth 2 (two) times daily with a meal. Hali Marry, MD Taking Active Self  nitroGLYCERIN (NITROSTAT) 0.4 MG SL tablet [id number] Yes Place 1 tablet (0.4 mg total) under the tongue every 5 (five) minutes as needed for chest pain (or tightness). Hali Marry, MD Taking Active Self  Omega-3 Fatty Acids (FISH OIL PO) [id number] Yes Take 1,000 Units by mouth daily. [provider] Taking Active Self  OZEMPIC, 0.25 OR 0.5 MG/DOSE, 2 MG/1.5ML SOPN [id number] Yes Inject 0.5 mg into the skin once a week. [provider] Taking Active            Med Note Dorene Ar Jul 05, 2021  4:17 PM) Sundays  SPIRIVA RESPIMAT 2.5 MCG/ACT AERS [id number] Yes Inhale 2 puffs into the lungs daily. Melvenia Needles, NP Taking Active Self            Patient Active Problem List   Diagnosis Date Noted   Angina pectoris ([place]) 06/23/2021   Hyperlipidemia 04/27/2021   History of non-ST elevation myocardial infarction (NSTEMI) 04/25/2021   Hilar adenopathy 10/13/2018   Mediastinal adenopathy 10/13/2018   Dupuytren's contracture of left hand 08/28/2018   Trigger finger, left index finger 08/28/2018   Non-seasonal allergic  rhinitis 08/26/2018   Type 2 diabetes mellitus with diabetic chronic kidney disease ([place]) 08/06/2018   Controlled diabetes mellitus type 2 with complications ([place]) 51/70/0174   Class 1 obesity due to excess calories with serious comorbidity in adult 12/27/2017   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   Encounter for long-term (current) use of medications 11/28/2017   Anticoagulant long-term use 10/21/2017   Sensorineural hearing loss (SNHL) of both ears 09/27/2017   Hypertension goal BP (blood pressure) < 140/90 08/29/2017   Coronary artery disease involving native coronary artery of native heart with angina pectoris ([place]) 08/29/2017   Hearing difficulty of both ears 05/29/2017  Trochanteric bursitis, right hip 05/29/2017   Gout of ankle 04/08/2017   History of myocardial infarct at age greater than 2 years 01/23/2017   Mixed diabetic hyperlipidemia associated with type 2 diabetes mellitus ([place]) 01/23/2017   Acquired hypothyroidism 01/23/2017   Severe persistent allergic asthma 01/16/2017   Paroxysmal atrial fibrillation ([place]) 01/16/2017   Cardiomegaly 01/16/2017    Immunization History  Administered Date(s) Administered   Fluad Quad(high Dose 65+) 09/22/2019   Influenza, High Dose Seasonal PF 08/29/2017, 09/07/2018, 08/13/2020   PFIZER(Purple Top)SARS-COV-2 Vaccination 12/13/2019, 01/04/2020, 09/03/2020, 03/24/2021   Pneumococcal Conjugate-13 11/16/2019   Pneumococcal Polysaccharide-23 08/29/2017   Tdap 05/29/2017   Zoster Recombinat (Shingrix) 02/28/2018, 06/12/2018    Conditions to be addressed/monitored: Atrial Fibrillation, HTN, HLD, and DMII  Care Plan : Medication Management  Updates made by Darius Bump, [place] since 07/08/2021 12:00 AM     Problem: HTN, HLD, DM, AFib      Long-Range Goal: Disease Progression Prevention   Start Date: 07/05/2021  This Visit's Progress: On track  Priority: High  Note:   Current Barriers:  Unable to independently afford treatment  regimen  Pharmacist Clinical Goal(s):  Over the next 30 days, patient will adhere to plan to optimize therapeutic regimen for diabetes as evidenced by report of adherence to recommended medication management changes & cost assistance paperwork through collaboration with PharmD and provider.   Interventions: 1:1 collaboration with Hali Marry, MD regarding development and update of comprehensive plan of care as evidenced by provider attestation and co-signature Inter-disciplinary care team collaboration (see longitudinal plan of care) Comprehensive medication review performed; medication list updated in electronic medical record  Diabetes:  Uncontrolled (a1c 8.8); current treatment:ozempic 0.88m weekly, metforming 1g BID, ;   Current glucose readings: fasting glucose: 120s - 140s  Denies hypoglycemic/hyperglycemic symptoms  Current meal patterns: breakfast: cream of wheat with fruit, or cheerios w/ fruit, 2 eggs; lunch: often skips lunch; dinner: 2-3 veggies and salad, avoids starches & red meats; snacks: popcorn, low calorie dessert bar; drinks: water, unsweet tea, occasional coffee  Current exercise: limited by walking causing SOB & chest pain   Educated on mechanism of action for understanding how the medicine works Recommended increase ozempic to 1 mg to increase glucose control as well as weight management  ,  Hypertension:  Controlled; current treatment:atenolol 1067mdaily, irbesartan-hctz 300-12.107m22mdecreased to 1/2 tablet by cardiology) daily, imdur 54m38mily;   Current home readings: 120s/80s  Denies hypotensive/hypertensive symptoms  Recommended continue current regimen,  Hyperlipidemia:  Uncontrolled; current treatment:atorvastatin 80mg59mly, repatha 420mg 56mhly, and omega 3 FA 1000mg d46m;   Recommended continue current regimen Assessed patient finances. Initiating PAP for cost coverage of repatha. Atrial Fibrillation:  Controlled; current rate/rhythm control:  atenolol 100mg da59m anticoagulant treatment: eliquis 107mg BID 69mcommended continue current regimen Assessed patient finances. Patient has met percentage of out of pocket spend, therefore eligible for cost assistance, printed application for patient to complete & return along w/income documentation & pharmacy spend document.   Patient Goals/Self-Care Activities Over the next 30 days, patient will:  take medications as prescribed and collaborate with provider on medication access solutions  Follow Up Plan: Face to Face appointment with care management team member scheduled for: 1 month      Medication Assistance:  Applications pending for repatha and eliquis.  Patient's preferred pharmacy is:  [facility] [phone]  [facility]) - [place], [place] [address] Phone: [phone] Fax: [phone]  [facility] [address] [place] [address] Phone: [phone] Fax: [phone]  Uses pill box? Yes Pt endorses 100% compliance  Follow Up:  Patient agrees to Care Plan and Follow-up.  Plan: Face to Face appointment with care management team member scheduled for: 1 month  Darius Bump

## 2021-07-06 ENCOUNTER — Ambulatory Visit: Payer: PPO

## 2021-07-08 NOTE — Patient Instructions (Signed)
Visit Information   PATIENT GOALS:   Goals Addressed             This Visit's Progress    Medication Management       Patient Goals/Self-Care Activities Over the next 30 days, patient will:  take medications as prescribed and collaborate with provider on medication access solutions  Follow Up Plan: Face to Face appointment with care management team member scheduled for: 1 month         Consent to CCM Services: Ms. Klostermann was given information about Chronic Care Management services including:  CCM service includes personalized support from designated clinical staff supervised by her physician, including individualized plan of care and coordination with other care providers 24/7 contact phone numbers for assistance for urgent and routine care needs. Service will only be billed when office clinical staff spend 20 minutes or more in a month to coordinate care. Only one practitioner may furnish and bill the service in a calendar month. The patient may stop CCM services at any time (effective at the end of the month) by phone call to the office staff. The patient will be responsible for cost sharing (co-pay) of up to 20% of the service fee (after annual deductible is met).  Patient agreed to services and verbal consent obtained.   Patient verbalizes understanding of instructions provided today and agrees to view in [place].   Face to Face appointment with care management team member scheduled for:  1 month [place] CARE PLAN: Patient Care Plan: Medication Management     Problem Identified: HTN, HLD, DM, AFib      Long-Range Goal: Disease Progression Prevention   Start Date: 07/05/2021  This Visit's Progress: On track  Priority: High  Note:   Current Barriers:  Unable to independently afford treatment regimen  Pharmacist Clinical Goal(s):  Over the next 30 days, patient will adhere to plan to optimize therapeutic regimen for diabetes as evidenced by  report of adherence to recommended medication management changes & cost assistance paperwork through collaboration with PharmD and provider.   Interventions: 1:1 collaboration with Hali Marry, MD regarding development and update of comprehensive plan of care as evidenced by provider attestation and co-signature Inter-disciplinary care team collaboration (see longitudinal plan of care) Comprehensive medication review performed; medication list updated in electronic medical record  Diabetes:  Uncontrolled (a1c 8.8); current treatment:ozempic 0.76m weekly, metforming 1g BID, ;   Current glucose readings: fasting glucose: 120s - 140s  Denies hypoglycemic/hyperglycemic symptoms  Current meal patterns: breakfast: cream of wheat with fruit, or cheerios w/ fruit, 2 eggs; lunch: often skips lunch; dinner: 2-3 veggies and salad, avoids starches & red meats; snacks: popcorn, low calorie dessert bar; drinks: water, unsweet tea, occasional coffee  Current exercise: limited by walking causing SOB & chest pain   Educated on mechanism of action for understanding how the medicine works Recommended increase ozempic to 1 mg to increase glucose control as well as weight management  ,  Hypertension:  Controlled; current treatment:atenolol 1057mdaily, irbesartan-hctz 300-12.31m30mdecreased to 1/2 tablet by cardiology) daily, imdur 30m14mily;   Current home readings: 120s/80s  Denies hypotensive/hypertensive symptoms  Recommended continue current regimen,  Hyperlipidemia:  Uncontrolled; current treatment:atorvastatin 80mg66mly, repatha 420mg 74mhly, and omega 3 FA 1000mg d67m;   Recommended continue current regimen Assessed patient finances. Initiating PAP for cost coverage of repatha. Atrial Fibrillation:  Controlled; current rate/rhythm control: atenolol 100mg da31m anticoagulant treatment: eliquis 31mg BID 31mcommended continue  current regimen Assessed patient finances. Patient has met  percentage of out of pocket spend, therefore eligible for cost assistance, printed application for patient to complete & return along w/income documentation & pharmacy spend document.   Patient Goals/Self-Care Activities Over the next 30 days, patient will:  take medications as prescribed and collaborate with provider on medication access solutions  Follow Up Plan: Face to Face appointment with care management team member scheduled for: 1 month

## 2021-07-12 ENCOUNTER — Encounter: Payer: Self-pay | Admitting: Family Medicine

## 2021-07-12 ENCOUNTER — Ambulatory Visit: Payer: PPO | Admitting: Pharmacist

## 2021-07-12 ENCOUNTER — Other Ambulatory Visit: Payer: Self-pay

## 2021-07-12 DIAGNOSIS — E1169 Type 2 diabetes mellitus with other specified complication: Secondary | ICD-10-CM

## 2021-07-12 DIAGNOSIS — I10 Essential (primary) hypertension: Secondary | ICD-10-CM

## 2021-07-12 DIAGNOSIS — I480 Paroxysmal atrial fibrillation: Secondary | ICD-10-CM

## 2021-07-12 DIAGNOSIS — E1165 Type 2 diabetes mellitus with hyperglycemia: Secondary | ICD-10-CM

## 2021-07-12 MED ORDER — SEMAGLUTIDE (1 MG/DOSE) 4 MG/3ML ~~LOC~~ SOPN: 1.0000 mg | PEN_INJECTOR | SUBCUTANEOUS | 4 refills | Status: DC

## 2021-07-12 NOTE — Addendum Note (Signed)
Addended by: Nani Gasser D on: 07/12/2021 09:15 AM   Modules accepted: Orders

## 2021-07-15 NOTE — Progress Notes (Signed)
Chronic Care Management Pharmacy Note  07/15/2021 Name:  Tara Campbell MRN:  [id number] DOB:  [birth year]  Summary: provided care coordination via completion of patient assistance paperwork, phone discussions with patient assistance customer service, and updated patient of next steps.  Recommendations/Changes made from today's visit: repatha patient assistance required patient to call their customer service number for a 3-way call w/pt insurance of confirmation that no prior Tara Campbell is needed. Patient is aware. Tara Campbell: sent patient a new copy of patient assistance application as first copy was misplaced.  Plan: f/u with pharmacist in 1 month  Subjective: Tara Campbell is an 75 y.o. year old female who is a primary patient of Metheney, Rene Kocher, MD.  The CCM team was consulted for assistance with disease management and care coordination needs.    Engaged with patient by telephone for follow up visit in response to provider referral for pharmacy case management and/or care coordination services.   Consent to Services:  The patient was given information about Chronic Care Management services, agreed to services, and gave verbal consent prior to initiation of services.  Please see initial visit note for detailed documentation.   Patient Care Team: Hali Marry, MD as PCP - General (Family Medicine) Lelon Perla, MD as PCP - Cardiology (Cardiology) Juanito Doom, MD as Consulting Physician (Pulmonary Disease) Lelon Perla, MD as Consulting Physician (Cardiology) Darius Bump, [facility] as Pharmacist (Pharmacist)   Objective:  Lab Results  Component Value Date   CREATININE 1.11 (H) 04/27/2021   CREATININE 1.05 (H) 04/26/2021   CREATININE 1.26 (H) 04/25/2021    Lab Results  Component Value Date   HGBA1C 8.8 (H) 04/26/2021   Last diabetic Eye exam:  Lab Results  Component Value Date/Time   HMDIABEYEEXA No Retinopathy 04/05/2021 12:00 AM         Component Value Date/Time   CHOL 240 (H) 04/26/2021 0449   TRIG 248 (H) 04/26/2021 0449   HDL 47 04/26/2021 0449   CHOLHDL 5.1 04/26/2021 0449   VLDL 50 (H) 04/26/2021 0449   LDLCALC 143 (H) 04/26/2021 0449   LDLCALC 165 (H) 03/22/2021 0000    Hepatic Function Latest Ref Rng & Units 04/24/2021 03/22/2021 10/09/2019  Total Protein 6.1 - 8.1 g/dL 7.1 7.0 6.6  Albumin 3.6 - 5.1 g/dL - - -  AST 10 - 35 U/L '19 13 18  ' ALT 6 - 29 U/L '17 16 18  ' Alk Phosphatase 33 - 130 U/L - - -  Total Bilirubin 0.2 - 1.2 mg/dL 0.4 0.5 0.6    Lab Results  Component Value Date/Time   TSH 2.65 04/24/2021 12:00 AM   TSH 3.15 03/22/2021 12:00 AM    CBC Latest Ref Rng & Units 04/27/2021 04/26/2021 04/25/2021  WBC 4.0 - 10.5 K/uL 7.2 9.0 8.8  Hemoglobin 12.0 - 15.0 g/dL 10.9(L) 12.4 12.6  Hematocrit 36.0 - 46.0 % 32.4(L) 37.2 37.5  Platelets 150 - 400 K/uL 265 279 305    Social History   Tobacco Use  Smoking Status Never  Smokeless Tobacco Never   BP Readings from Last 3 Encounters:  06/23/21 (!) 102/42  06/09/21 120/68  05/24/21 126/60   Pulse Readings from Last 3 Encounters:  06/23/21 72  06/09/21 77  05/24/21 86   Wt Readings from Last 3 Encounters:  06/23/21 189 lb 0.6 oz (85.7 kg)  06/09/21 191 lb 6.4 oz (86.8 kg)  05/24/21 191 lb 6.4 oz (86.8 kg)    Assessment: Review  of patient past medical history, allergies, medications, health status, including review of consultants reports, laboratory and other test data, was performed as part of comprehensive evaluation and provision of chronic care management services.   SDOH:  (Social Determinants of Health) assessments and interventions performed:    CCM Care Plan  Allergies  Allergen Reactions   Latex Hives   Sulfa Antibiotics Rash    Medications Reviewed Today     Reviewed by Darius Bump, [facility] (Pharmacist) on 07/08/21 at [place] Status: <None>   Medication Order Taking? Sig Documenting Provider Last Dose Status Informant   acetaminophen (TYLENOL) 500 MG tablet [id number] No Take 1,000 mg by mouth every 6 (six) hours as needed for mild pain.  Patient not taking: Reported on 07/05/2021   [provider] Not Taking Active Self  albuterol (VENTOLIN HFA) 108 (90 Base) MCG/ACT inhaler [id number] Yes Inhale 2 puffs into the lungs every 6 (six) hours as needed for wheezing. Hali Marry, MD Taking Active Self  allopurinol (ZYLOPRIM) 300 MG tablet [id number] Yes Take 1 tablet (300 mg total) by mouth 2 (two) times daily.  Patient taking differently: Take 300 mg by mouth daily.   Hali Marry, MD Taking Active   apixaban (Tara Campbell) 5 MG TABS tablet [id number] Yes Take 1 tablet (5 mg total) by mouth 2 (two) times daily. Hali Marry, MD Taking Active Self  atenolol (TENORMIN) 100 MG tablet [id number] Yes Take 1 tablet (100 mg total) by mouth daily. Hali Marry, MD Taking Active Self  atorvastatin (LIPITOR) 80 MG tablet [id number] Yes Take 1 tablet (80 mg total) by mouth at bedtime. Hali Marry, MD Taking Active Self  blood glucose meter kit and supplies KIT [id number] Yes Check morning fasting blood glucose daily and up to 4 times daily as directed [name], PA-C Taking Active Self  clopidogrel (PLAVIX) 75 MG tablet [id number] Yes Take 1 tablet (75 mg total) by mouth daily. Cheryln Manly, NP Taking Active   Cyanocobalamin (B-12 PO) [id number] Yes Take 1,000 mcg by mouth daily. [provider] Taking Active Self  EPINEPHrine 0.3 mg/0.3 mL IJ SOAJ injection [id number] Yes Inject 0.3 mg into the muscle as needed for anaphylaxis. Call 9-1-1 after use. Marshell Garfinkel, MD Taking Active Self  Evolocumab with Infusor ([place]) 420 MG/3.5ML SOCT [id number] Yes Inject 420 mg into the skin every 30 (thirty) days. Lelon Perla, MD Taking Active   ezetimibe (ZETIA) 10 MG tablet [id number] Yes TAKE ONE TABLET BY MOUTH EVERY DAY Stanford Breed Denice Bors,  MD Taking Active   fluticasone (FLONASE) 50 MCG/ACT nasal spray [id number] Yes Place 1 spray into both nostrils daily. [name], PA-C Taking Active Self  fluticasone furoate-vilanterol (BREO ELLIPTA) 200-25 MCG/INH AEPB [id number] Yes Inhale 1 puff into the lungs daily. Needs appt for further refills Parrett, [name] S, NP Taking Active Self  Fluticasone-Umeclidin-Vilant (TRELEGY ELLIPTA) 100-62.5-25 MCG/INH AEPB [id number] No Inhale 1 puff into the lungs daily. HAS NOT STARTED YET, finishing RX of breo and spiriva.  Patient not taking: Reported on 07/08/2021   [provider] Not Taking Active   glucose blood test strip [id number] Yes To be used twice daily for testing blood sugars. E11.65 Hali Marry, MD Taking Active Self  ipratropium-albuterol (DUONEB) 0.5-2.5 (3) MG/3ML SOLN [id number] Yes Take 3 mLs by nebulization every 6 (six) hours as needed.  Patient taking differently: Take 3 mLs by nebulization every 6 (six) hours as needed (shortness of  breath).   Hali Marry, MD Taking Active Self  irbesartan-hydrochlorothiazide (AVALIDE) 300-12.5 MG tablet [id number] Yes Take 1 tablet by mouth daily.  Patient taking differently: Take 1 tablet by mouth daily. Take 1/2 tablet daily   Hali Marry, MD Taking Active   isosorbide mononitrate (IMDUR) 60 MG 24 hr tablet [id number] Yes Take 1 tablet (60 mg total) by mouth daily. Lelon Perla, MD Taking Active   levalbuterol [facility] HFA) 45 MCG/ACT inhaler [id number] Yes Inhale 1-2 puffs into the lungs every 6 (six) hours as needed for wheezing or shortness of breath. [provider] Taking Active Self  levothyroxine (SYNTHROID) 112 MCG tablet [id number] Yes Take 1 tablet (112 mcg total) by mouth daily before breakfast. Hali Marry, MD Taking Active Self  Mepolizumab ([name]) 100 MG/ML Darden Palmer [id number] Yes Inject 1 mL (100 mg total) into the skin every 28 (twenty-eight) days. Marshell Garfinkel, MD  Taking Active Self  metFORMIN (GLUCOPHAGE) 500 MG tablet [id number] Yes Take 2 tablets (1,000 mg total) by mouth 2 (two) times daily with a meal. Hali Marry, MD Taking Active Self  nitroGLYCERIN (NITROSTAT) 0.4 MG SL tablet [id number] Yes Place 1 tablet (0.4 mg total) under the tongue every 5 (five) minutes as needed for chest pain (or tightness). Hali Marry, MD Taking Active Self  Omega-3 Fatty Acids (FISH OIL PO) [id number] Yes Take 1,000 Units by mouth daily. [provider] Taking Active Self  OZEMPIC, 0.25 OR 0.5 MG/DOSE, 2 MG/1.5ML SOPN [id number] Yes Inject 0.5 mg into the skin once a week. [provider] Taking Active            Med Note Dorene Ar Jul 05, 2021  4:17 PM) Sundays  SPIRIVA RESPIMAT 2.5 MCG/ACT AERS [id number] Yes Inhale 2 puffs into the lungs daily. Melvenia Needles, NP Taking Active Self            Patient Active Problem List   Diagnosis Date Noted   Angina pectoris ([place]) 06/23/2021   Hyperlipidemia 04/27/2021   History of non-ST elevation myocardial infarction (NSTEMI) 04/25/2021   Hilar adenopathy 10/13/2018   Mediastinal adenopathy 10/13/2018   Dupuytren's contracture of left hand 08/28/2018   Trigger finger, left index finger 08/28/2018   Non-seasonal allergic rhinitis 08/26/2018   Type 2 diabetes mellitus with diabetic chronic kidney disease ([place]) 08/06/2018   Controlled diabetes mellitus type 2 with complications ([place]) 38/93/7342   Class 1 obesity due to excess calories with serious comorbidity in adult 12/27/2017   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   Encounter for long-term (current) use of medications 11/28/2017   Anticoagulant long-term use 10/21/2017   Sensorineural hearing loss (SNHL) of both ears 09/27/2017   Hypertension goal BP (blood pressure) < 140/90 08/29/2017   Coronary artery disease involving native coronary artery of native heart with angina pectoris ([place]) 08/29/2017   Hearing difficulty of  both ears 05/29/2017   Trochanteric bursitis, right hip 05/29/2017   Gout of ankle 04/08/2017   History of myocardial infarct at age greater than 33 years 01/23/2017   Mixed diabetic hyperlipidemia associated with type 2 diabetes mellitus ([place]) 01/23/2017   Acquired hypothyroidism 01/23/2017   Severe persistent allergic asthma 01/16/2017   Paroxysmal atrial fibrillation ([place]) 01/16/2017   Cardiomegaly 01/16/2017    Immunization History  Administered Date(s) Administered   Fluad Quad(high Dose 65+) 09/22/2019   Influenza, High Dose Seasonal PF 08/29/2017, 09/07/2018, 08/13/2020   PFIZER(Purple Top)SARS-COV-2 Vaccination 12/13/2019, 01/04/2020, 09/03/2020, 03/24/2021   Pneumococcal  Conjugate-13 11/16/2019   Pneumococcal Polysaccharide-23 08/29/2017   Tdap 05/29/2017   Zoster Recombinat (Shingrix) 02/28/2018, 06/12/2018    Conditions to be addressed/monitored: Atrial Fibrillation, HTN, HLD, and DMII  Care Plan : Medication Management  Updates made by Darius Bump, [place] since 07/15/2021 12:00 AM     Problem: HTN, HLD, DM, AFib      Long-Range Goal: Disease Progression Prevention   Start Date: 07/05/2021  Recent Progress: On track  Priority: High  Note:   Current Barriers:  Unable to independently afford treatment regimen   Pharmacist Clinical Goal(s):  Over the next 30 days, patient will adhere to plan to optimize therapeutic regimen for diabetes as evidenced by report of adherence to recommended medication management changes & cost assistance paperwork through collaboration with PharmD and provider.   Interventions: 1:1 collaboration with Hali Marry, MD regarding development and update of comprehensive plan of care as evidenced by provider attestation and co-signature Inter-disciplinary care team collaboration (see longitudinal plan of care) Comprehensive medication review performed; medication list updated in electronic medical record  Diabetes:  Uncontrolled  (a1c 8.8); current treatment:ozempic 0.21m weekly, metforming 1g BID, ;   Current glucose readings: fasting glucose: 120s - 140s  Denies hypoglycemic/hyperglycemic symptoms  Current meal patterns: breakfast: cream of wheat with fruit, or cheerios w/ fruit, 2 eggs; lunch: often skips lunch; dinner: 2-3 veggies and salad, avoids starches & red meats; snacks: popcorn, low calorie dessert bar; drinks: water, unsweet tea, occasional coffee  Current exercise: limited by walking causing SOB & chest pain   Educated on mechanism of action for understanding how the medicine works Recommended increase ozempic to 1 mg to increase glucose control as well as weight management  ,  Hypertension:  Controlled; current treatment:atenolol 107mdaily, irbesartan-hctz 300-12.26m57mdecreased to 1/2 tablet by cardiology) daily, imdur 22m31mily;   Current home readings: 120s/80s  Denies hypotensive/hypertensive symptoms  Recommended continue current regimen,  Hyperlipidemia:  Uncontrolled; current treatment:atorvastatin 80mg39mly, repatha 420mg 70mhly, and omega 3 FA 1000mg d44m;   Recommended continue current regimen Assessed patient finances. Initiating PAP for cost coverage of repatha. Atrial Fibrillation:  Controlled; current rate/rhythm control: atenolol 100mg da81m anticoagulant treatment: Tara Campbell 26mg BID 76mcommended continue current regimen Assessed patient finances. Patient has met percentage of out of pocket spend, therefore eligible for cost assistance, printed application for patient to complete & return along w/income documentation & pharmacy spend document.   Patient Goals/Self-Care Activities Over the next 30 days, patient will:  take medications as prescribed and collaborate with provider on medication access solutions  Follow Up Plan: Face to Face appointment with care management team member scheduled for: 1 month      Medication Assistance:  Repatha and Tara Campbell PAP pending  Patient's  preferred pharmacy is:  [facility] [address] [phone] [phone] box? Yes Pt endorses 100% compliance  Follow Up:  Patient agrees to Care Plan and Follow-up.  Plan: Face to Face appointment with care management team member scheduled for: 1 month  Shamyra Farias J Darius Bump

## 2021-07-15 NOTE — Patient Instructions (Signed)
Visit Information  PATIENT GOALS:  Goals Addressed             This Visit's Progress    Medication Management       Patient Goals/Self-Care Activities Over the next 30 days, patient will:  take medications as prescribed and collaborate with provider on medication access solutions  Follow Up Plan: Face to Face appointment with care management team member scheduled for: 1 month          Patient verbalizes understanding of instructions provided today and agrees to view in MyChart.   Face to Face appointment with care management team member scheduled for:  1 month  Tara Campbell

## 2021-07-17 ENCOUNTER — Ambulatory Visit: Payer: PPO | Admitting: Family Medicine

## 2021-07-26 ENCOUNTER — Telehealth (HOSPITAL_COMMUNITY): Payer: Self-pay | Admitting: *Deleted

## 2021-07-26 NOTE — Telephone Encounter (Signed)
I called Tara Campbell to confirm that she is coming for her CR orientation appointment and to complete her health history. I reached her, but she had a meeting to attend and was only able to speak with me for a few minutes. She ask me to call her back around 3pm. I will try to reach her then.

## 2021-07-26 NOTE — Telephone Encounter (Signed)
Called Tara Campbell to give reminder of her CR orientation appointment. I was unable to reach her but I did leave her a message to return my call.

## 2021-07-27 ENCOUNTER — Encounter (HOSPITAL_COMMUNITY)
Admission: RE | Admit: 2021-07-27 | Discharge: 2021-07-27 | Disposition: A | Discharge status: Home / Self Care | Payer: PPO | Source: Ambulatory Visit | Attending: Cardiology | Admitting: Cardiology

## 2021-07-27 ENCOUNTER — Encounter (HOSPITAL_COMMUNITY): Payer: Self-pay

## 2021-07-27 ENCOUNTER — Other Ambulatory Visit: Payer: Self-pay

## 2021-07-27 VITALS — BP 130/60 | HR 82 | Ht 62.25 in | Wt 186.7 lb

## 2021-07-27 DIAGNOSIS — I214 Non-ST elevation (NSTEMI) myocardial infarction: Secondary | ICD-10-CM | POA: Insufficient documentation

## 2021-07-27 HISTORY — DX: Atherosclerotic heart disease of native coronary artery without angina pectoris: I25.10

## 2021-07-27 NOTE — Progress Notes (Addendum)
Cardiac Rehab Medication Review by a Nurse  Does the patient  feel that his/her medications are working for him/her?  yes  Has the patient been experiencing any side effects to the medications prescribed?  no  Does the patient measure his/her own blood pressure or blood glucose at home?  yes   Does the patient have any problems obtaining medications due to transportation or finances?   no  Understanding of regimen: good Understanding of indications: good Potential of compliance: good    Nurse comments: Tara Campbell is taking her medications as prescribed and has a good understanding of what her medications are for. Sandy checks her CBG's on a daily basis. Tara Campbell has a BP monitor. Tara Campbell does not check her Blood pressure on a daily basis.    Thayer Headings RN BSN 07/27/2021 1:42 PM

## 2021-07-27 NOTE — Progress Notes (Signed)
Cardiac Individual Treatment Plan  Patient Details  Name: Tara Campbell MRN: [id number] Date of Birth: [birth year] Referring Provider:   Flowsheet Row CARDIAC REHAB PHASE II ORIENTATION from 07/27/2021 in [facility]  Referring Provider Kirk Ruths, MD       Initial Encounter Date:  [facility] PHASE II ORIENTATION from 07/27/2021 in [facility]  Date 07/27/21       Visit Diagnosis: 04/25/21 NSTEMI Medical Treatment  Patient's Home Medications on Admission:  Current Outpatient Medications:    albuterol (VENTOLIN HFA) 108 (90 Base) MCG/ACT inhaler, Inhale 2 puffs into the lungs every 6 (six) hours as needed for wheezing., Disp: 18 g, Rfl: prn   allopurinol (ZYLOPRIM) 300 MG tablet, Take 1 tablet (300 mg total) by mouth 2 (two) times daily. (Patient taking differently: Take 300 mg by mouth daily.), Disp: 180 tablet, Rfl: 1   apixaban (ELIQUIS) 5 MG TABS tablet, Take 1 tablet (5 mg total) by mouth 2 (two) times daily., Disp: 180 tablet, Rfl: 1   atenolol (TENORMIN) 100 MG tablet, Take 1 tablet (100 mg total) by mouth daily., Disp: 90 tablet, Rfl: 1   atorvastatin (LIPITOR) 80 MG tablet, Take 1 tablet (80 mg total) by mouth at bedtime., Disp: 90 tablet, Rfl: 3   clopidogrel (PLAVIX) 75 MG tablet, Take 1 tablet (75 mg total) by mouth daily., Disp: 90 tablet, Rfl: 1   Cyanocobalamin (B-12 PO), Take 1,000 mcg by mouth daily., Disp: , Rfl:    EPINEPHrine 0.3 mg/0.3 mL IJ SOAJ injection, Inject 0.3 mg into the muscle as needed for anaphylaxis. Call 9-1-1 after use., Disp: 1 each, Rfl: 2   Evolocumab with Infusor ([place]) 420 MG/3.5ML SOCT, Inject 420 mg into the skin every 30 (thirty) days., Disp: 3.6 mL, Rfl: 11   ezetimibe (ZETIA) 10 MG tablet, TAKE ONE TABLET BY MOUTH EVERY DAY (Patient taking differently: Take 10 mg by mouth daily.), Disp: 90 tablet, Rfl: 0   fluticasone (FLONASE) 50 MCG/ACT  nasal spray, Place 1 spray into both nostrils daily., Disp: 16 g, Rfl: 3   fluticasone furoate-vilanterol (BREO ELLIPTA) 200-25 MCG/INH AEPB, Inhale 1 puff into the lungs daily. Needs appt for further refills, Disp: 60 each, Rfl: 6   ipratropium-albuterol (DUONEB) 0.5-2.5 (3) MG/3ML SOLN, Take 3 mLs by nebulization every 6 (six) hours as needed. (Patient taking differently: Take 3 mLs by nebulization every 6 (six) hours as needed (shortness of breath).), Disp: 3 mL, Rfl: PRN   irbesartan-hydrochlorothiazide (AVALIDE) 300-12.5 MG tablet, Take 1 tablet by mouth daily. (Patient taking differently: Take 0.5 tablets by mouth daily.), Disp: 90 tablet, Rfl: 1   isosorbide mononitrate (IMDUR) 60 MG 24 hr tablet, Take 1 tablet (60 mg total) by mouth daily., Disp: 90 tablet, Rfl: 1   levalbuterol (XOPENEX HFA) 45 MCG/ACT inhaler, Inhale 1-2 puffs into the lungs every 6 (six) hours as needed for wheezing or shortness of breath., Disp: , Rfl:    levothyroxine (SYNTHROID) 112 MCG tablet, Take 1 tablet (112 mcg total) by mouth daily before breakfast., Disp: 90 tablet, Rfl: 1   Mepolizumab ([name]) 100 MG/ML SOAJ, Inject 1 mL (100 mg total) into the skin every 28 (twenty-eight) days., Disp: 1 mL, Rfl: 5   metFORMIN (GLUCOPHAGE) 500 MG tablet, Take 2 tablets (1,000 mg total) by mouth 2 (two) times daily with a meal., Disp: 360 tablet, Rfl: 1   nitroGLYCERIN (NITROSTAT) 0.4 MG SL tablet, Place 1 tablet (0.4  mg total) under the tongue every 5 (five) minutes as needed for chest pain (or tightness)., Disp: 30 tablet, Rfl: prn   Omega-3 Fatty Acids (FISH OIL PO), Take 1,000 Units by mouth daily., Disp: , Rfl:    Semaglutide, 1 MG/DOSE, 4 MG/3ML SOPN, Inject 1 mg as directed once a week., Disp: 3 mL, Rfl: 4   SPIRIVA RESPIMAT 2.5 MCG/ACT AERS, Inhale 2 puffs into the lungs daily., Disp: 4 g, Rfl: 5   blood glucose meter kit and supplies KIT, Check morning fasting blood glucose daily and up to 4 times daily as directed, Disp:  1 each, Rfl: 0   glucose blood test strip, To be used twice daily for testing blood sugars. 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   Otomycosis of right ear 09/27/2017   Paroxysmal atrial fibrillation ([facility])    Thyroid disease     Tobacco Use: Social History   Tobacco Use  Smoking Status Never  Smokeless Tobacco Never    Labs: Recent Review Flowsheet Data     Labs for ITP Cardiac and Pulmonary Rehab Latest Ref Rng & Units 06/20/2020 10/03/2020 03/22/2021 03/24/2021 04/26/2021   Cholestrol 0 - 200 mg/dL - - 266(H) - 240(H)   LDLCALC 0 - 99 mg/dL - - 165(H) - 143(H)   HDL >40 mg/dL - - 56 - 47   Trlycerides <150 mg/dL - - 273(H) - 248(H)   Hemoglobin A1c 4.8 - 5.6 % 6.9(A) 7.8(A) - 8.7(A) 8.8(H)       Capillary Blood Glucose: Lab Results  Component Value Date   GLUCAP 109 (H) 04/27/2021   GLUCAP 198 (H) 04/27/2021   GLUCAP 143 (H) 04/27/2021   GLUCAP 143 (H) 04/27/2021   GLUCAP 158 (H) 04/26/2021     Exercise Target Goals: Exercise Program Goal: Individual exercise prescription set using results from initial 6 min walk test and THRR while considering  patient's activity barriers and safety.   Exercise Prescription Goal: Starting with aerobic activity 30 plus minutes a day, 3 days per week for initial exercise prescription. Provide home exercise prescription and guidelines that participant acknowledges understanding prior to discharge.  Activity Barriers & Risk Stratification:  Activity Barriers & Cardiac Risk Stratification - 07/27/21 1449       Activity Barriers & Cardiac Risk Stratification   Activity  Barriers Arthritis;Joint Problems;Deconditioning;Shortness of Breath;Balance Concerns    Cardiac Risk Stratification High             6 Minute Walk:  6 Minute Walk     Row Name 07/27/21 1355         6 Minute Walk   Phase Initial     Distance 1249 feet     Walk Time 6 minutes     # of Rest Breaks 0     MPH 2.37     METS 2.52     RPE 13     Perceived Dyspnea  2     VO2 Peak 8.83     Symptoms Yes (comment)  Comments Pain: Right hip chronic, 4/10. SOB, RPD = 2     Resting HR 88 bpm     Resting BP 130/60     Resting Oxygen Saturation  98 %     Exercise Oxygen Saturation  during 6 min walk 97 %     Max Ex. HR 113 bpm     Max Ex. BP 176/78     2 Minute Post BP 150/68  Re-check 118/70              Oxygen Initial Assessment:   Oxygen Re-Evaluation:   Oxygen Discharge (Final Oxygen Re-Evaluation):   Initial Exercise Prescription:  Initial Exercise Prescription - 07/27/21 1400       Date of Initial Exercise RX and Referring Provider   Date 07/27/21    Referring Provider Kirk Ruths, MD    Expected Discharge Date 09/22/21      NuStep   Level 2    SPM 75    Minutes 30    METs 2      Prescription Details   Frequency (times per week) 3    Duration Progress to 30 minutes of continuous aerobic without signs/symptoms of physical distress      Intensity   THRR 40-80% of Max Heartrate 57-114    Ratings of Perceived Exertion 11-13    Perceived Dyspnea 0-4      Progression   Progression Continue progressive overload as per policy without signs/symptoms or physical distress.      Resistance Training   Training Prescription Yes    Weight 3 lbs    Reps 10-15             Perform Capillary Blood Glucose checks as needed.  Exercise Prescription Changes:   Exercise Comments:   Exercise Goals and Review:   Exercise Goals     Row Name 07/27/21 1449             Exercise Goals   Increase Physical Activity Yes       Intervention Provide  advice, education, support and counseling about physical activity/exercise needs.;Develop an individualized exercise prescription for aerobic and resistive training based on initial evaluation findings, risk stratification, comorbidities and participant's personal goals.       Expected Outcomes Long Term: Add in home exercise to make exercise part of routine and to increase amount of physical activity.;Long Term: Exercising regularly at least 3-5 days a week.;Short Term: Attend rehab on a regular basis to increase amount of physical activity.       Increase Strength and Stamina Yes       Intervention Provide advice, education, support and counseling about physical activity/exercise needs.;Develop an individualized exercise prescription for aerobic and resistive training based on initial evaluation findings, risk stratification, comorbidities and participant's personal goals.       Expected Outcomes Long Term: Improve cardiorespiratory fitness, muscular endurance and strength as measured by increased METs and functional capacity (6MWT);Short Term: Perform resistance training exercises routinely during rehab and add in resistance training at home;Short Term: Increase workloads from initial exercise prescription for resistance, speed, and METs.       Able to understand and use rate of perceived exertion (RPE) scale Yes       Intervention Provide education and explanation on how to use RPE scale       Expected Outcomes Short Term: Able to use RPE daily in rehab to express subjective intensity level;Long Term:  Able to use RPE to guide intensity level when exercising  independently       Knowledge and understanding of Target Heart Rate Range (THRR) Yes       Intervention Provide education and explanation of THRR including how the numbers were predicted and where they are located for reference       Expected Outcomes Short Term: Able to state/look up THRR;Short Term: Able to use daily as guideline for intensity  in rehab;Long Term: Able to use THRR to govern intensity when exercising independently       Understanding of Exercise Prescription Yes       Intervention Provide education, explanation, and written materials on patient's individual exercise prescription       Expected Outcomes Short Term: Able to explain program exercise prescription;Long Term: Able to explain home exercise prescription to exercise independently                Exercise Goals Re-Evaluation :    Discharge Exercise Prescription (Final Exercise Prescription Changes):   Nutrition:  Target Goals: Understanding of nutrition guidelines, daily intake of sodium <1591m, cholesterol <2025m calories 30% from fat and 7% or less from saturated fats, daily to have 5 or more servings of fruits and vegetables.  Biometrics:  Pre Biometrics - 07/27/21 1310       Pre Biometrics   Waist Circumference 45.5 inches    Hip Circumference 46.75 inches    Waist to Hip Ratio 0.97 %    Triceps Skinfold 32 mm    % Body Fat 47.8 %    Grip Strength 16 kg    Flexibility 13.5 in    Single Leg Stand 3.25 seconds              Nutrition Therapy Plan and Nutrition Goals:   Nutrition Assessments:  MEDIFICTS Score Key: ?70 Need to make dietary changes  40-70 Heart Healthy Diet ? 40 Therapeutic Level Cholesterol Diet   Picture Your Plate Scores: <4<77nhealthy dietary pattern with much room for improvement. 41-50 Dietary pattern unlikely to meet recommendations for good health and room for improvement. 51-60 More healthful dietary pattern, with some room for improvement.  >60 Healthy dietary pattern, although there may be some specific behaviors that could be improved.    Nutrition Goals Re-Evaluation:   Nutrition Goals Discharge (Final Nutrition Goals Re-Evaluation):   Psychosocial: Target Goals: Acknowledge presence or absence of significant depression and/or stress, maximize coping skills, provide positive support  system. Participant is able to verbalize types and ability to use techniques and skills needed for reducing stress and depression.  Initial Review & Psychosocial Screening:  Initial Psych Review & Screening - 07/27/21 1346       Initial Review   Current issues with None Identified      Family Dynamics   Good Support System? Yes   Sandy lives alone. SaLovey Newcomeras her sister, a newphew and a neighbor for support.     Barriers   Psychosocial barriers to participate in program There are no identifiable barriers or psychosocial needs.      Screening Interventions   Interventions Encouraged to exercise             Quality of Life Scores:  Quality of Life - 07/27/21 1443       Quality of Life   Select Quality of Life      Quality of Life Scores   Health/Function Pre 24.5 %    Socioeconomic Pre 25.83 %    Psych/Spiritual Pre 27.71 %    Family Pre 27 %  GLOBAL Pre 25.77 %            Scores of 19 and below usually indicate a poorer quality of life in these areas.  A difference of  2-3 points is a clinically meaningful difference.  A difference of 2-3 points in the total score of the Quality of Life Index has been associated with significant improvement in overall quality of life, self-image, physical symptoms, and general health in studies assessing change in quality of life.  PHQ-9: Recent Review Flowsheet Data     Depression screen [facility] 2/9 07/27/2021 06/23/2021 04/06/2021 03/24/2021 10/05/2019   Decreased Interest 0 - 0 0 0   Down, Depressed, Hopeless 0 2 0 0 0   PHQ - 2 Score 0 2 0 0 0      Interpretation of Total Score  Total Score Depression Severity:  1-4 = Minimal depression, 5-9 = Mild depression, 10-14 = Moderate depression, 15-19 = Moderately severe depression, 20-27 = Severe depression   Psychosocial Evaluation and Intervention:   Psychosocial Re-Evaluation:   Psychosocial Discharge (Final Psychosocial Re-Evaluation):   Vocational Rehabilitation: Provide  vocational rehab assistance to qualifying candidates.   Vocational Rehab Evaluation & Intervention:  Vocational Rehab - 07/27/21 1347       Initial Vocational Rehab Evaluation & Intervention   Assessment shows need for Vocational Rehabilitation No   [name] is retired and does not need vocational rehab at this time            Education: Education Goals: Education classes will be provided on a weekly basis, covering required topics. Participant will state understanding/return demonstration of topics presented.  Learning Barriers/Preferences:  Learning Barriers/Preferences - 07/27/21 1445       Learning Barriers/Preferences   Learning Barriers Sight;Hearing    Learning Preferences Written Material;Computer/Internet             Education Topics: Hypertension, Hypertension Reduction -Define heart disease and high blood pressure. Discus how high blood pressure affects the body and ways to reduce high blood pressure.   Exercise and Your Heart -Discuss why it is important to exercise, the FITT principles of exercise, normal and abnormal responses to exercise, and how to exercise safely.   Angina -Discuss definition of angina, causes of angina, treatment of angina, and how to decrease risk of having angina.   Cardiac Medications -Review what the following cardiac medications are used for, how they affect the body, and side effects that may occur when taking the medications.  Medications include Aspirin, Beta blockers, calcium channel blockers, ACE Inhibitors, angiotensin receptor blockers, diuretics, digoxin, and antihyperlipidemics.   Congestive Heart Failure -Discuss the definition of CHF, how to live with CHF, the signs and symptoms of CHF, and how keep track of weight and sodium intake.   Heart Disease and Intimacy -Discus the effect sexual activity has on the heart, how changes occur during intimacy as we age, and safety during sexual activity.   Smoking Cessation  / COPD -Discuss different methods to quit smoking, the health benefits of quitting smoking, and the definition of COPD.   Nutrition I: Fats -Discuss the types of cholesterol, what cholesterol does to the heart, and how cholesterol levels can be controlled.   Nutrition II: Labels -Discuss the different components of food labels and how to read food label   Heart Parts/Heart Disease and PAD -Discuss the anatomy of the heart, the pathway of blood circulation through the heart, and these are affected by heart disease.   Stress I: Signs and  Symptoms -Discuss the causes of stress, how stress may lead to anxiety and depression, and ways to limit stress.   Stress II: Relaxation -Discuss different types of relaxation techniques to limit stress.   Warning Signs of Stroke / TIA -Discuss definition of a stroke, what the signs and symptoms are of a stroke, and how to identify when someone is having stroke.   Knowledge Questionnaire Score:  Knowledge Questionnaire Score - 07/27/21 1444       Knowledge Questionnaire Score   Pre Score 20/24             Core Components/Risk Factors/Patient Goals at Admission:  Personal Goals and Risk Factors at Admission - 07/27/21 1444       Core Components/Risk Factors/Patient Goals on Admission    Weight Management Yes;Obesity;Weight Loss    Intervention Weight Management: Develop a combined nutrition and exercise program designed to reach desired caloric intake, while maintaining appropriate intake of nutrient and fiber, sodium and fats, and appropriate energy expenditure required for the weight goal.;Weight Management: Provide education and appropriate resources to help participant work on and attain dietary goals.;Weight Management/Obesity: Establish reasonable short term and long term weight goals.;Obesity: Provide education and appropriate resources to help participant work on and attain dietary goals.    Admit Weight 186 lb 11.7 oz (84.7 kg)     Expected Outcomes Short Term: Continue to assess and modify interventions until short term weight is achieved;Long Term: Adherence to nutrition and physical activity/exercise program aimed toward attainment of established weight goal;Weight Maintenance: Understanding of the daily nutrition guidelines, which includes 25-35% calories from fat, 7% or less cal from saturated fats, less than 285m cholesterol, less than 1.5gm of sodium, & 5 or more servings of fruits and vegetables daily;Weight Loss: Understanding of general recommendations for a balanced deficit meal plan, which promotes 1-2 lb weight loss per week and includes a negative energy balance of [phone] kcal/d;Understanding recommendations for meals to include 15-35% energy as protein, 25-35% energy from fat, 35-60% energy from carbohydrates, less than 2036mof dietary cholesterol, 20-35 gm of total fiber daily;Understanding of distribution of calorie intake throughout the day with the consumption of 4-5 meals/snacks;Weight Gain: Understanding of general recommendations for a high calorie, high protein meal plan that promotes weight gain by distributing calorie intake throughout the day with the consumption for 4-5 meals, snacks, and/or supplements    Diabetes Yes    Intervention Provide education about signs/symptoms and action to take for hypo/hyperglycemia.;Provide education about proper nutrition, including hydration, and aerobic/resistive exercise prescription along with prescribed medications to achieve blood glucose in normal ranges: Fasting glucose 65-99 mg/dL    Expected Outcomes Short Term: Participant verbalizes understanding of the signs/symptoms and immediate care of hyper/hypoglycemia, proper foot care and importance of medication, aerobic/resistive exercise and nutrition plan for blood glucose control.;Long Term: Attainment of HbA1C < 7%.    Hypertension Yes    Intervention Provide education on lifestyle modifcations including regular  physical activity/exercise, weight management, moderate sodium restriction and increased consumption of fresh fruit, vegetables, and low fat dairy, alcohol moderation, and smoking cessation.;Monitor prescription use compliance.    Expected Outcomes Short Term: Continued assessment and intervention until BP is < 140/9079mG in hypertensive participants. < 130/26m58m in hypertensive participants with diabetes, heart failure or chronic kidney disease.;Long Term: Maintenance of blood pressure at goal levels.    Lipids Yes    Intervention Provide education and support for participant on nutrition & aerobic/resistive exercise along with prescribed medications to achieve  LDL <78m, HDL >480m    Expected Outcomes Short Term: Participant states understanding of desired cholesterol values and is compliant with medications prescribed. Participant is following exercise prescription and nutrition guidelines.;Long Term: Cholesterol controlled with medications as prescribed, with individualized exercise RX and with personalized nutrition plan. Value goals: LDL < 7035mHDL > 40 mg.    Stress Yes    Intervention Offer individual and/or small group education and counseling on adjustment to heart disease, stress management and health-related lifestyle change. Teach and support self-help strategies.;Refer participants experiencing significant psychosocial distress to appropriate mental health specialists for further evaluation and treatment. When possible, include family members and significant others in education/counseling sessions.    Expected Outcomes Short Term: Participant demonstrates changes in health-related behavior, relaxation and other stress management skills, ability to obtain effective social support, and compliance with psychotropic medications if prescribed.;Long Term: Emotional wellbeing is indicated by absence of clinically significant psychosocial distress or social isolation.             Core  Components/Risk Factors/Patient Goals Review:    Core Components/Risk Factors/Patient Goals at Discharge (Final Review):    ITP Comments:  ITP Comments     Row Name 07/27/21 1341           ITP Comments Dr TraFransico Him, Medical Director                Comments: SanLovey Newcomertended orientation on 07/27/2021 to review rules and guidelines for program.  Completed 6 minute walk test, Intitial ITP, and exercise prescription.  VSS. Telemetry-Sinus Rhythm first degree heart block this has been previously documented. Sandy did experience right hip pain and moderate shortness of breath this resolved with rest..Moderate exertional BP's noted will continue to monitor BP. Safety measures and social distancing in place per CDC guidelines.MarBarnet PallN,BSN 07/27/2021 3:53 PM

## 2021-07-31 ENCOUNTER — Other Ambulatory Visit: Payer: Self-pay

## 2021-07-31 ENCOUNTER — Encounter (HOSPITAL_COMMUNITY)
Admission: RE | Admit: 2021-07-31 | Discharge: 2021-07-31 | Disposition: A | Discharge status: Home / Self Care | Payer: PPO | Source: Ambulatory Visit | Attending: Cardiology | Admitting: Cardiology

## 2021-07-31 DIAGNOSIS — I214 Non-ST elevation (NSTEMI) myocardial infarction: Secondary | ICD-10-CM | POA: Diagnosis not present

## 2021-07-31 NOTE — Progress Notes (Signed)
Daily Session Note  Patient Details  Name: Tara Campbell MRN: [id number] Date of Birth: 1946/08/30 Referring Provider:   Flowsheet Row CARDIAC REHAB PHASE II ORIENTATION from 07/27/2021 in [place]  Referring Provider Tara Ruths, MD       Encounter Date: 07/31/2021  Check In:  Session Check In - 07/31/21 1321       Check-In   Supervising physician immediately available to respond to emergencies [facility] immediately available    Physician(s) Tara Campbell    Location [facility]    Staff Present Tara Rubenstein, MS, ACSM-CEP, CCRP, Exercise Physiologist;Tara Campbell BS, ACSM EP-C, Exercise Physiologist;Tara Celesta Aver, MS, ACSM CEP, Exercise Physiologist;Tara Clagg Wilber Oliphant, RN, BSN    Virtual Visit No    Medication changes reported     No    Fall or balance concerns reported    No    Tobacco Cessation No Change    Current number of cigarettes/nicotine per day     0    Warm-up and Cool-down Performed on first and last piece of equipment   Cardiac Rehab Orientation   Resistance Training Performed Yes    VAD Patient? No    PAD/SET Patient? No      Pain Assessment   Currently in Pain? No/denies    Pain Score 0-No pain    Multiple Pain Sites No             Capillary Blood Glucose: No results found for this or any previous visit (from the past 24 hour(s)).    Social History   Tobacco Use  Smoking Status Never  Smokeless Tobacco Never    Goals Met:  Exercise tolerated well No report of concerns or symptoms today  Goals Unmet:  Not Applicable  Comments: Pt started cardiac rehab today.  Pt tolerated light exercise without difficulty. VSS, telemetry-SR with occasional PAC, asymptomatic.  Medication list reconciled. Pt denies barriers to medication compliance.  PSYCHOSOCIAL ASSESSMENT:  PHQ-0 per screening completed on 8/25. Pt exhibits positive coping skills, hopeful outlook with supportive family.  Although  for pt this second event was a bit rocky feeling down and depressed when she first got home from the hospital.  Tara Campbell was able to recognize what was happening and was able to employ coping skills.  No psychosocial needs identified at this time, no psychosocial interventions necessary.    Pt enjoys painting and doing crafts.   Pt oriented to exercise equipment and routine.   Understanding verbalized.    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

## 2021-08-01 LAB — GLUCOSE, CAPILLARY
Glucose-Capillary: 169 mg/dL — ABNORMAL HIGH (ref 70–99)
Glucose-Capillary: 126 mg/dL — ABNORMAL HIGH (ref 70–99)

## 2021-08-02 ENCOUNTER — Encounter (HOSPITAL_COMMUNITY): Payer: PPO

## 2021-08-02 ENCOUNTER — Encounter: Payer: Self-pay | Admitting: Family Medicine

## 2021-08-02 ENCOUNTER — Ambulatory Visit: Payer: PPO

## 2021-08-02 ENCOUNTER — Telehealth (INDEPENDENT_AMBULATORY_CARE_PROVIDER_SITE_OTHER): Payer: PPO | Admitting: Physician Assistant

## 2021-08-02 ENCOUNTER — Other Ambulatory Visit: Payer: PPO

## 2021-08-02 ENCOUNTER — Encounter: Payer: Self-pay | Admitting: Physician Assistant

## 2021-08-02 ENCOUNTER — Telehealth (HOSPITAL_COMMUNITY): Payer: Self-pay | Admitting: Family Medicine

## 2021-08-02 VITALS — BP 157/71 | Temp 97.5°F | Ht 62.25 in | Wt 186.0 lb

## 2021-08-02 DIAGNOSIS — R059 Cough, unspecified: Secondary | ICD-10-CM | POA: Diagnosis not present

## 2021-08-02 DIAGNOSIS — I480 Paroxysmal atrial fibrillation: Secondary | ICD-10-CM

## 2021-08-02 DIAGNOSIS — E1169 Type 2 diabetes mellitus with other specified complication: Secondary | ICD-10-CM | POA: Diagnosis not present

## 2021-08-02 DIAGNOSIS — E785 Hyperlipidemia, unspecified: Secondary | ICD-10-CM

## 2021-08-02 DIAGNOSIS — U071 COVID-19: Secondary | ICD-10-CM | POA: Diagnosis not present

## 2021-08-02 DIAGNOSIS — I10 Essential (primary) hypertension: Secondary | ICD-10-CM | POA: Diagnosis not present

## 2021-08-02 DIAGNOSIS — E1165 Type 2 diabetes mellitus with hyperglycemia: Secondary | ICD-10-CM

## 2021-08-02 MED ORDER — MOLNUPIRAVIR EUA 200MG CAPSULE
4.0000 | ORAL_CAPSULE | Freq: Two times a day (BID) | ORAL | 0 refills | Status: AC
Start: 2021-08-02 — End: 2021-08-07

## 2021-08-02 NOTE — Telephone Encounter (Signed)
Patient scheduled for a virtual visit  °

## 2021-08-02 NOTE — Telephone Encounter (Signed)
Hello Dr. Tonia Brooms, please advise on mychart message below as Dr. Isaiah Serge is off today. Thanks!  Dr. Isaiah Serge,  I was exposed to Covid last week Friday and I tested positive this morning. With my history of Allergic Asthma what should be the protocol that I follow.  I have a call in to Dr. Nani Gasser who is my primary doctor about starting Paxlovid or the suitable medication.  Is there anything that I need to be aware of regarding the interaction of Covid and my Asthma.  If you could advise me of what to do that would be greatly appreciated.  Tara Campbell, Cell Phone [phone].

## 2021-08-02 NOTE — Progress Notes (Signed)
..Virtual Visit via Telephone Note  I connected with Tara Campbell on 08/02/21 at  3:20 PM EDT by telephone and verified that I am speaking with the correct person using two identifiers.  Location: Patient: home Provider: clinic  .Marland KitchenParticipating in visit:  Patient: Tara Campbell Provider: Tandy Gaw PA-C   I discussed the limitations, risks, security and privacy concerns of performing an evaluation and management service by telephone and the availability of in person appointments. I also discussed with the patient that there may be a patient responsible charge related to this service. The patient expressed understanding and agreed to proceed.   History of Present Illness: Pt is a 75 yo obese female with past medical history of CAD, HTN, recent heart attack PAF, hypothyroidism, type 2 diabetes, asthma who presents to the clinic to discuss positive home covid test today.  Patient is fully vaccinated with both boosters.  She had a family member with COVID and she likely got it from.  Currently she has a dry cough and some nasal congestion.  She denies any significant difficulty breathing, fever, body aches, fatigue, GI side effects, shortness of breath.  She is interested in the antiviral.   .. Active Ambulatory Problems    Diagnosis Date Noted   Severe persistent allergic asthma 01/16/2017   Paroxysmal atrial fibrillation (HCC) 01/16/2017   Cardiomegaly 01/16/2017   History of myocardial infarct at age greater than 60 years 01/23/2017   Mixed diabetic hyperlipidemia associated with type 2 diabetes mellitus (HCC) 01/23/2017   Acquired hypothyroidism 01/23/2017   Gout of ankle 04/08/2017   Hearing difficulty of both ears 05/29/2017   Trochanteric bursitis, right hip 05/29/2017   Hypertension goal BP (blood pressure) < 140/90 08/29/2017   Coronary artery disease involving native coronary artery of native heart with angina pectoris (HCC) 08/29/2017   Sensorineural hearing loss (SNHL) of both  ears 09/27/2017   Anticoagulant long-term use 10/21/2017   Encounter for long-term (current) use of medications 11/28/2017   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   Controlled diabetes mellitus type 2 with complications (HCC) 12/27/2017   Class 1 obesity due to excess calories with serious comorbidity in adult 12/27/2017   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   Non-seasonal allergic rhinitis 08/26/2018   Dupuytren's contracture of left hand 08/28/2018   Trigger finger, left index finger 08/28/2018   Hilar adenopathy 10/13/2018   Mediastinal adenopathy 10/13/2018   History of non-ST elevation myocardial infarction (NSTEMI) 04/25/2021   Hyperlipidemia 04/27/2021   Angina pectoris (HCC) 06/23/2021   Resolved Ambulatory Problems    Diagnosis Date Noted   Controlled type 2 diabetes mellitus without complication, without long-term current use of insulin (HCC) 01/23/2017   Severe persistent asthma with exacerbation 01/06/2017   Encounter for monitoring statin therapy 05/29/2017   Blurred vision 05/29/2017   Need for shingles vaccine 05/31/2017   Otomycosis of right ear 09/27/2017   Idiopathic chronic gout 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 11/28/2017   Encounter for weight loss counseling 03/06/2018   Urticaria 08/24/2019   Acute bronchitis 08/16/2020   Asthma 09/28/2020   Sinusitis 11/17/2020   Past Medical History:  Diagnosis Date   Allergy    Arthritis    Colon polyps    Coronary artery disease    Diabetes mellitus without complication (HCC)    Gout    Heart attack (HCC) 07/30/2016   Hypertension    Internal hemorrhoids    Left rotator cuff tear    Thyroid disease  Observations/Objective: No acute distress  No labored breathing Dry cough  .Marland Kitchen Today's Vitals   08/02/21 1511  BP: (!) 157/71  Temp: (!) 97.5 F (36.4 C)  TempSrc: Oral  Weight: 186 lb (84.4 kg)  Height: 5' 2.25" (1.581 m)   Body mass index is 33.75  kg/m.    Assessment and Plan: Marland KitchenMarland KitchenTanaiya was seen today for covid positive.  Diagnoses and all orders for this visit:  COVID-19 virus infection -     molnupiravir EUA 200 mg CAPS; Take 4 capsules (800 mg total) by mouth 2 (two) times daily for 5 days.  Cough  Pt is Day 2 of symptoms.  Quarantine for 5 days and mask for 10 days. If still running a fever or feeling bad at 5 days quarantine 10 full days.  Very high risk of complications.  Started molnupiravir due to apixiban contraindication with paxlovid.  Marland Kitchen.Vitamin D3 5000 IU (125 mcg) daily Vitamin C 500 mg twice daily Zinc 50 to 75 mg daily Encouraged hydration and deep breathing.  Use duoneb as needed every 4-6 hours. Stay on inhalers.  Discussed red flag signs and symptoms and get to ED with pulse ox under 92 percent.  Ok to use tylenol, cough drops, delsym, mucinex.    Follow Up Instructions:    I discussed the assessment and treatment plan with the patient. The patient was provided an opportunity to ask questions and all were answered. The patient agreed with the plan and demonstrated an understanding of the instructions.   The patient was advised to call back or seek an in-person evaluation if the symptoms worsen or if the condition fails to improve as anticipated.  I provided 20 minutes of non-face-to-face time during this encounter.   Tandy Gaw, PA-C

## 2021-08-02 NOTE — Progress Notes (Signed)
Tested this AM - Covid + Scratchy throat yesterday Sister/nephew + Friday No other symptoms  Has taken tylenol for symptom but no other meds

## 2021-08-03 ENCOUNTER — Other Ambulatory Visit: Payer: Self-pay | Admitting: Physician Assistant

## 2021-08-03 ENCOUNTER — Telehealth: Payer: Self-pay | Admitting: Neurology

## 2021-08-03 ENCOUNTER — Telehealth: Payer: PPO

## 2021-08-03 MED ORDER — NIRMATRELVIR/RITONAVIR (PAXLOVID) TABLET (RENAL DOSING): 2.0000 | ORAL_TABLET | Freq: Two times a day (BID) | ORAL | 0 refills | Status: AC

## 2021-08-03 NOTE — Progress Notes (Signed)
Spoke with pharmacy. Said I could give paxlovid if decreased eliquis 2.5mg  bid while on it and 3 days after.

## 2021-08-03 NOTE — Telephone Encounter (Signed)
Tara Campbell with [facility] called and states they do not have the ability to fill molnupiravir EUA 200 mg CAPS He asked if we could write for Paxlovid? Please advise.

## 2021-08-04 ENCOUNTER — Encounter (HOSPITAL_COMMUNITY): Payer: PPO

## 2021-08-08 ENCOUNTER — Telehealth: Payer: Self-pay | Admitting: Family Medicine

## 2021-08-08 NOTE — Telephone Encounter (Signed)
Patient is positive for covid and would like to cancel and reschedule.

## 2021-08-09 ENCOUNTER — Encounter (HOSPITAL_COMMUNITY): Payer: PPO

## 2021-08-09 ENCOUNTER — Ambulatory Visit: Payer: PPO

## 2021-08-11 ENCOUNTER — Encounter (HOSPITAL_COMMUNITY): Payer: PPO

## 2021-08-11 ENCOUNTER — Other Ambulatory Visit: Payer: Self-pay

## 2021-08-11 ENCOUNTER — Ambulatory Visit (INDEPENDENT_AMBULATORY_CARE_PROVIDER_SITE_OTHER): Payer: PPO | Admitting: Pharmacist

## 2021-08-11 DIAGNOSIS — E1165 Type 2 diabetes mellitus with hyperglycemia: Secondary | ICD-10-CM

## 2021-08-11 DIAGNOSIS — I10 Essential (primary) hypertension: Secondary | ICD-10-CM

## 2021-08-11 DIAGNOSIS — E1169 Type 2 diabetes mellitus with other specified complication: Secondary | ICD-10-CM

## 2021-08-11 DIAGNOSIS — I480 Paroxysmal atrial fibrillation: Secondary | ICD-10-CM

## 2021-08-11 NOTE — Progress Notes (Signed)
Chronic Care Management Pharmacy Note  08/12/2021 Name:  Tara Campbell MRN:  [id number] DOB:  July 06, 1946  Summary: addressed HTN, HLD, DM, Afib - primarily following up for progress w/financial assistance paperwork. Repatha approved! Patient still working on [employer] paperwork, will let me know when she needs further support/guidance from me.  Recommendations/Changes made from today's visit: none, doing well  Plan: f/u with pharmacist in 2 months (after pt travels to [place] to help daughter recover from knee surgery)  Subjective: Tara Campbell is an 75 y.o. year old female who is a primary patient of Metheney, Rene Kocher, MD.  The CCM team was consulted for assistance with disease management and care coordination needs.    Engaged with patient by telephone for follow up visit in response to provider referral for pharmacy case management and/or care coordination services.   Consent to Services:  The patient was given information about Chronic Care Management services, agreed to services, and gave verbal consent prior to initiation of services.  Please see initial visit note for detailed documentation.   Patient Care Team: Hali Marry, MD as PCP - General (Family Medicine) Stanford Breed Denice Bors, MD as PCP - Cardiology (Cardiology) Juanito Doom, MD as Consulting Physician (Pulmonary Disease) Lelon Perla, MD as Consulting Physician (Cardiology) Darius Bump, [facility] as Pharmacist (Pharmacist)   Objective:  Lab Results  Component Value Date   CREATININE 1.11 (H) 04/27/2021   CREATININE 1.05 (H) 04/26/2021   CREATININE 1.26 (H) 04/25/2021    Lab Results  Component Value Date   HGBA1C 8.8 (H) 04/26/2021   Last diabetic Eye exam:  Lab Results  Component Value Date/Time   HMDIABEYEEXA No Retinopathy 04/05/2021 12:00 AM    Last diabetic Foot exam: No results found for: HMDIABFOOTEX      Component Value Date/Time   CHOL 240 (H) 04/26/2021 0449    TRIG 248 (H) 04/26/2021 0449   HDL 47 04/26/2021 0449   CHOLHDL 5.1 04/26/2021 0449   VLDL 50 (H) 04/26/2021 0449   LDLCALC 143 (H) 04/26/2021 0449   LDLCALC 165 (H) 03/22/2021 0000    Hepatic Function Latest Ref Rng & Units 04/24/2021 03/22/2021 10/09/2019  Total Protein 6.1 - 8.1 g/dL 7.1 7.0 6.6  Albumin 3.6 - 5.1 g/dL - - -  AST 10 - 35 U/L _0 ALT 6 - 29 U/L _1 Alk Phosphatase 33 - 130 U/L - - -  Total Bilirubin 0.2 - 1.2 mg/dL 0.4 0.5 0.6    Lab Results  Component Value Date/Time   TSH 2.65 04/24/2021 12:00 AM   TSH 3.15 03/22/2021 12:00 AM    CBC Latest Ref Rng & Units 04/27/2021 04/26/2021 04/25/2021  WBC 4.0 - 10.5 K/uL 7.2 9.0 8.8  Hemoglobin 12.0 - 15.0 g/dL 10.9(L) 12.4 12.6  Hematocrit 36.0 - 46.0 % 32.4(L) 37.2 37.5  Platelets 150 - 400 K/uL 265 279 305    Social History   Tobacco Use  Smoking Status Never  Smokeless Tobacco Never   BP Readings from Last 3 Encounters:  08/02/21 (!) 157/71  07/27/21 130/60  06/23/21 (!) 102/42   Pulse Readings from Last 3 Encounters:  07/27/21 82  06/23/21 72  06/09/21 77   Wt Readings from Last 3 Encounters:  08/02/21 186 lb (84.4 kg)  07/27/21 186 lb 11.7 oz (84.7 kg)  06/23/21 189 lb 0.6 oz (85.7 kg)    Assessment: Review of patient  past medical history, allergies, medications, health status, including review of consultants reports, laboratory and other test data, was performed as part of comprehensive evaluation and provision of chronic care management services.   SDOH:  (Social Determinants of Health) assessments and interventions performed:    CCM Care Plan  Allergies  Allergen Reactions   Latex Hives   Sulfa Antibiotics Rash    Medications Reviewed Today     Reviewed by Lavada Mesi (Physician Assistant) on 08/02/21 at 1548  Med List Status: <None>   Medication Order Taking? Sig Documenting Provider Last Dose Status Informant  albuterol (VENTOLIN HFA) 108 (90 Base) MCG/ACT  inhaler [id number] Yes Inhale 2 puffs into the lungs every 6 (six) hours as needed for wheezing. Hali Marry, MD Taking Active Self  allopurinol (ZYLOPRIM) 300 MG tablet [id number] Yes Take 1 tablet (300 mg total) by mouth 2 (two) times daily.  Patient taking differently: Take 300 mg by mouth daily.   Hali Marry, MD Taking Active   apixaban (ELIQUIS) 5 MG TABS tablet [id number] Yes Take 1 tablet (5 mg total) by mouth 2 (two) times daily. Hali Marry, MD Taking Active Self  atenolol (TENORMIN) 100 MG tablet [id number] Yes Take 1 tablet (100 mg total) by mouth daily. Hali Marry, MD Taking Active Self  atorvastatin (LIPITOR) 80 MG tablet [id number] Yes Take 1 tablet (80 mg total) by mouth at bedtime. Hali Marry, MD Taking Active Self  blood glucose meter kit and supplies KIT [id number] Yes Check morning fasting blood glucose daily and up to 4 times daily as directed [name], PA-C Taking Active Self  clopidogrel (PLAVIX) 75 MG tablet [id number] Yes Take 1 tablet (75 mg total) by mouth daily. Cheryln Manly, NP Taking Active Self  Cyanocobalamin (B-12 PO) [id number] Yes Take 1,000 mcg by mouth daily. [provider] Taking Active Self  EPINEPHrine 0.3 mg/0.3 mL IJ SOAJ injection [id number] No Inject 0.3 mg into the muscle as needed for anaphylaxis. Call 9-1-1 after use.  Patient not taking: Reported on 08/02/2021   Marshell Garfinkel, MD Not Taking Active Self  Evolocumab with Infusor ([place]) 420 MG/3.5ML SOCT [id number] Yes Inject 420 mg into the skin every 30 (thirty) days. Lelon Perla, MD Taking Active Self  ezetimibe (ZETIA) 10 MG tablet [id number] Yes TAKE ONE TABLET BY MOUTH EVERY DAY  Patient taking differently: Take 10 mg by mouth daily.   Lelon Perla, MD Taking Active Self  fluticasone (FLONASE) 50 MCG/ACT nasal spray [id number] Yes Place 1 spray into both nostrils daily. [name], PA-C Taking Active Self  fluticasone furoate-vilanterol (BREO ELLIPTA) 200-25 MCG/INH AEPB [id number] Yes Inhale 1 puff into the lungs daily. Needs appt for further refills Parrett, [name] S, NP Taking Active Self  glucose blood test strip [id number] Yes To be used twice daily for testing blood sugars. E11.65 Hali Marry, MD Taking Active Self  ipratropium-albuterol (DUONEB) 0.5-2.5 (3) MG/3ML SOLN [id number] Yes Take 3 mLs by nebulization every 6 (six) hours as needed.  Patient taking differently: Take 3 mLs by nebulization every 6 (six) hours as needed (shortness of breath).   Hali Marry, MD Taking Active Self  irbesartan-hydrochlorothiazide (AVALIDE) 300-12.5 MG tablet [id number] Yes Take 1 tablet by mouth daily.  Patient taking differently: Take 0.5 tablets by mouth daily.   Hali Marry, MD Taking Active Self  isosorbide mononitrate (IMDUR) 60 MG 24 hr tablet [id number] Yes Take 1 tablet (60 mg  total) by mouth daily. Lelon Perla, MD Taking Active Self  levalbuterol [facility] HFA) 45 MCG/ACT inhaler [id number] Yes Inhale 1-2 puffs into the lungs every 6 (six) hours as needed for wheezing or shortness of breath. [provider] Taking Active Self  levothyroxine (SYNTHROID) 112 MCG tablet [id number] Yes Take 1 tablet (112 mcg total) by mouth daily before breakfast. Hali Marry, MD Taking Active Self  Mepolizumab ([name]) 100 MG/ML Darden Palmer [id number] Yes Inject 1 mL (100 mg total) into the skin every 28 (twenty-eight) days. Marshell Garfinkel, MD Taking Active Self  metFORMIN (GLUCOPHAGE) 500 MG tablet [id number]  Take 2 tablets (1,000 mg total) by mouth 2 (two) times daily with a meal. Hali Marry, MD  Expired 07/23/21 2359 Self  molnupiravir EUA 200 mg CAPS [id number] Yes Take 4 capsules (800 mg total) by mouth 2 (two) times daily for 5 days. Breeback, Jade L, PA-C  Active   nitroGLYCERIN (NITROSTAT) 0.4 MG SL tablet [id number] No Place 1  tablet (0.4 mg total) under the tongue every 5 (five) minutes as needed for chest pain (or tightness).  Patient not taking: Reported on 08/02/2021   Hali Marry, MD Not Taking Active Self  Omega-3 Fatty Acids (FISH OIL PO) [id number] Yes Take 1,000 Units by mouth daily. [provider] Taking Active Self  Semaglutide, 1 MG/DOSE, 4 MG/3ML SOPN [id number] Yes Inject 1 mg as directed once a week. Hali Marry, MD Taking Active Self  SPIRIVA RESPIMAT 2.5 MCG/ACT AERS [id number] Yes Inhale 2 puffs into the lungs daily. Melvenia Needles, NP Taking Active Self            Patient Active Problem List   Diagnosis Date Noted   Angina pectoris ([place]) 06/23/2021   Hyperlipidemia 04/27/2021   History of non-ST elevation myocardial infarction (NSTEMI) 04/25/2021   Hilar adenopathy 10/13/2018   Mediastinal adenopathy 10/13/2018   Dupuytren's contracture of left hand 08/28/2018   Trigger finger, left index finger 08/28/2018   Non-seasonal allergic rhinitis 08/26/2018   Type 2 diabetes mellitus with diabetic chronic kidney disease ([place]) 08/06/2018   Controlled diabetes mellitus type 2 with complications ([place]) 76/81/1572   Class 1 obesity due to excess calories with serious comorbidity in adult 12/27/2017   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   Encounter for long-term (current) use of medications 11/28/2017   Anticoagulant long-term use 10/21/2017   Sensorineural hearing loss (SNHL) of both ears 09/27/2017   Hypertension goal BP (blood pressure) < 140/90 08/29/2017   Coronary artery disease involving native coronary artery of native heart with angina pectoris ([place]) 08/29/2017   Hearing difficulty of both ears 05/29/2017   Trochanteric bursitis, right hip 05/29/2017   Gout of ankle 04/08/2017   History of myocardial infarct at age greater than 66 years 01/23/2017   Mixed diabetic hyperlipidemia associated with type 2 diabetes mellitus ([place]) 01/23/2017   Acquired hypothyroidism  01/23/2017   Severe persistent allergic asthma 01/16/2017   Paroxysmal atrial fibrillation ([place]) 01/16/2017   Cardiomegaly 01/16/2017    Immunization History  Administered Date(s) Administered   Fluad Quad(high Dose 65+) 09/22/2019   Influenza, High Dose Seasonal PF 08/29/2017, 09/07/2018, 08/13/2020   PFIZER(Purple Top)SARS-COV-2 Vaccination 12/13/2019, 01/04/2020, 09/03/2020, 03/24/2021   Pneumococcal Conjugate-13 11/16/2019   Pneumococcal Polysaccharide-23 08/29/2017   Tdap 05/29/2017   Zoster Recombinat (Shingrix) 02/28/2018, 06/12/2018    Conditions to be addressed/monitored: Atrial Fibrillation, HTN, HLD, and DMII  Care Plan : Medication Management  Updates made by Darius Bump, [place] since 08/12/2021 12:00  AM     Problem: HTN, HLD, DM, AFib      Long-Range Goal: Disease Progression Prevention   Start Date: 07/05/2021  Recent Progress: On track  Priority: High  Note:   Current Barriers:  Unable to independently afford treatment regimen   Pharmacist Clinical Goal(s):  Over the next 60 days, patient will adhere to plan to optimize therapeutic regimen for chronic conditions as evidenced by report of adherence to recommended medication management changes & cost assistance paperwork through collaboration with PharmD and provider.   Interventions: 1:1 collaboration with Hali Marry, MD regarding development and update of comprehensive plan of care as evidenced by provider attestation and co-signature Inter-disciplinary care team collaboration (see longitudinal plan of care) Comprehensive medication review performed; medication list updated in electronic medical record  Diabetes:  Uncontrolled (a1c 8.8); current treatment:ozempic 20m weekly, metforming 1g BID, ;   Current glucose readings: fasting glucose: 120s - 140s  Denies hypoglycemic/hyperglycemic symptoms  Current meal patterns: breakfast: cream of wheat with fruit, or cheerios w/ fruit, 2 eggs; lunch:  often skips lunch; dinner: 2-3 veggies and salad, avoids starches & red meats; snacks: popcorn, low calorie dessert bar; drinks: water, unsweet tea, occasional coffee  Current exercise: limited by walking causing SOB & chest pain   Educated on mechanism of action for understanding how the medicine works Recommended continue current regimen,  Hypertension:  Controlled; current treatment:atenolol 1061mdaily, irbesartan-hctz 300-12.41m40mdecreased to 1/2 tablet by cardiology) daily, imdur 31m67mily;   Current home readings: 120s/80s  Denies hypotensive/hypertensive symptoms  Recommended continue current regimen,  Hyperlipidemia:  Uncontrolled; current treatment:atorvastatin 80mg61mly, repatha 420mg 72mhly, and omega 3 FA 1000mg d87m;   Recommended continue current regimen Assessed patient finances.PAP for cost coverage of repatha approved! Atrial Fibrillation:  Controlled; current rate/rhythm control: atenolol 100mg da86m anticoagulant treatment: eliquis 41mg BID 31mcommended continue current regimen Assessed patient finances. Patient has met percentage of out of pocket spend, therefore eligible for cost assistance, printed application for patient to complete & return along w/income documentation & pharmacy spend document. Application still pending, patient working on it & will let us know wKorean she needs further guidance/support from me.  Patient Goals/Self-Care Activities Over the next 60 days, patient will:  take medications as prescribed and collaborate with provider on medication access solutions  Follow Up Plan: Face to Face appointment with care management team member scheduled for: 2 months     Medication Assistance:  Application for repatha confirmed! Coverage through 12/02/21. Application for Eliquis pending.  Patient's preferred pharmacy [facility]:  [facility] [phone] [facility] [phone] [facility] [address] [phone] box? Yes Pt endorses 100% compliance  Follow Up:  Patient agrees to Care Plan and Follow-up.  Plan: Face to Face appointment with care management team member scheduled for: 2 months  Emila Steinhauser J Darius Bump

## 2021-08-12 NOTE — Patient Instructions (Signed)
Visit Information  PATIENT GOALS:  Goals Addressed             This Visit's Progress    Medication Management       Patient Goals/Self-Care Activities Over the next 60 days, patient will:  take medications as prescribed and collaborate with provider on medication access solutions  Follow Up Plan: Face to Face appointment with care management team member scheduled for: 2 months        Patient verbalizes understanding of instructions provided today and agrees to view in MyChart.   Face to Face appointment with care management team member scheduled for:  2 months

## 2021-08-14 ENCOUNTER — Encounter (HOSPITAL_COMMUNITY)
Admission: RE | Admit: 2021-08-14 | Discharge: 2021-08-14 | Disposition: A | Discharge status: Home / Self Care | Payer: PPO | Source: Ambulatory Visit | Attending: Cardiology | Admitting: Cardiology

## 2021-08-14 ENCOUNTER — Other Ambulatory Visit: Payer: Self-pay

## 2021-08-14 DIAGNOSIS — I214 Non-ST elevation (NSTEMI) myocardial infarction: Secondary | ICD-10-CM | POA: Insufficient documentation

## 2021-08-14 LAB — GLUCOSE, CAPILLARY: Glucose-Capillary: 87 mg/dL (ref 70–99)

## 2021-08-14 NOTE — Progress Notes (Signed)
Reviewed home exercise Rx with patient today.discussed walking at home 30-45 minutes 2-4 x/week in addition to her 3 days/week here in the [facility] program. Encouraged warm-up, cool-down and stretching. Reviewed THRR of 57-114 and keeping RPE between 11-13. Encouraged hydration. Reviewed weather parameters for temperature and humidity for =safe exercise outdoors. Reviewed S/S to terminate exercise and when to MD vs 911. Reviewed use of NTG and encouraged to carry at all times. Encouraged pt to know what blood sugar is before exercise; to carry a rapid acting glucose snack; and to carry a phone for safety. Pt verbalized understanding of the home exercise Rx and was provided a copy.   Lorin Picket MS, ACSM-CEP, CCRP

## 2021-08-14 NOTE — Progress Notes (Signed)
HPI: FU CAD and atrial fibrillation. Cardiac catheterization in [place] in 2017 showed severe stenosis in the right coronary and patient had PCI at that time. Patient apparently was admitted to [facility] in February 2018 with asthma exacerbation/flu and atrial fibrillation associated with these conditions and Sudafed.  Echocardiogram May 2022 showed ejection fraction 60 to 65%, mild left ventricular hypertrophy, grade 1 diastolic dysfunction, moderate left atrial enlargement.  Cardiac catheterization May 2022 showed severe complex stenosis of the proximal, mid and distal LAD with patent stents in the right coronary artery.  It was felt to be unfavorable for PCI and medical therapy recommended.  Since last seen she denies dyspnea, chest pain, palpitations or syncope.  Current Outpatient Medications  Medication Sig Dispense Refill   albuterol (VENTOLIN HFA) 108 (90 Base) MCG/ACT inhaler Inhale 2 puffs into the lungs every 6 (six) hours as needed for wheezing. 18 g prn   allopurinol (ZYLOPRIM) 300 MG tablet Take 1 tablet (300 mg total) by mouth 2 (two) times daily. (Patient taking differently: Take 300 mg by mouth daily.) 180 tablet 1   apixaban (ELIQUIS) 5 MG TABS tablet Take 1 tablet (5 mg total) by mouth 2 (two) times daily. 180 tablet 1   atenolol (TENORMIN) 100 MG tablet Take 1 tablet (100 mg total) by mouth daily. 90 tablet 1   atorvastatin (LIPITOR) 80 MG tablet Take 1 tablet (80 mg total) by mouth at bedtime. 90 tablet 3   blood glucose meter kit and supplies KIT Check morning fasting blood glucose daily and up to 4 times daily as directed 1 each 0   clopidogrel (PLAVIX) 75 MG tablet Take 1 tablet (75 mg total) by mouth daily. 90 tablet 1   Cyanocobalamin (B-12 PO) Take 1,000 mcg by mouth daily.     EPINEPHrine 0.3 mg/0.3 mL IJ SOAJ injection Inject 0.3 mg into the muscle as needed for anaphylaxis. Call 9-1-1 after use. 1 each 2   Evolocumab with Infusor (Mayer) 420  MG/3.5ML SOCT Inject 420 mg into the skin every 30 (thirty) days. 3.6 mL 11   ezetimibe (ZETIA) 10 MG tablet TAKE ONE TABLET BY MOUTH EVERY DAY (Patient taking differently: Take 10 mg by mouth daily.) 90 tablet 0   fluticasone (FLONASE) 50 MCG/ACT nasal spray Place 1 spray into both nostrils daily. 16 g 3   fluticasone furoate-vilanterol (BREO ELLIPTA) 200-25 MCG/INH AEPB Inhale 1 puff into the lungs daily. Needs appt for further refills 60 each 6   glucose blood test strip To be used twice daily for testing blood sugars. E11.65 200 each 11   ipratropium-albuterol (DUONEB) 0.5-2.5 (3) MG/3ML SOLN Take 3 mLs by nebulization every 6 (six) hours as needed. (Patient taking differently: Take 3 mLs by nebulization every 6 (six) hours as needed (shortness of breath).) 3 mL PRN   irbesartan-hydrochlorothiazide (AVALIDE) 300-12.5 MG tablet Take 1 tablet by mouth daily. (Patient taking differently: Take 0.5 tablets by mouth daily.) 90 tablet 1   isosorbide mononitrate (IMDUR) 60 MG 24 hr tablet Take 1 tablet (60 mg total) by mouth daily. 90 tablet 1   levalbuterol (XOPENEX HFA) 45 MCG/ACT inhaler Inhale 1-2 puffs into the lungs every 6 (six) hours as needed for wheezing or shortness of breath.     levothyroxine (SYNTHROID) 112 MCG tablet Take 1 tablet (112 mcg total) by mouth daily before breakfast. 90 tablet 1   Mepolizumab ([name]) 100 MG/ML SOAJ Inject 1 mL (100 mg total) into the skin every  28 (twenty-eight) days. 1 mL 5   nitroGLYCERIN (NITROSTAT) 0.4 MG SL tablet Place 1 tablet (0.4 mg total) under the tongue every 5 (five) minutes as needed for chest pain (or tightness). 30 tablet prn   Omega-3 Fatty Acids (FISH OIL PO) Take 1,000 Units by mouth daily.     Semaglutide, 1 MG/DOSE, 4 MG/3ML SOPN Inject 1 mg as directed once a week. 3 mL 4   SPIRIVA RESPIMAT 2.5 MCG/ACT AERS Inhale 2 puffs into the lungs daily. 4 g 5   TRULICITY 1.5 EY/8.1KG SOPN Inject 1.5 mg into the skin once a week.     metFORMIN  (GLUCOPHAGE) 500 MG tablet Take 2 tablets (1,000 mg total) by mouth 2 (two) times daily with a meal. 360 tablet 1   No current facility-administered medications for this visit.     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   Paroxysmal atrial fibrillation ([place])    Thyroid disease     Past Surgical History:  Procedure Laterality Date   ANGIOPLASTY     CARDIAC CATHETERIZATION     COLONOSCOPY W/ POLYPECTOMY  01/04/2015   HEMORRHOID BANDING     LEFT HEART CATH AND CORONARY ANGIOGRAPHY N/A 04/26/2021   Procedure: LEFT HEART CATH AND CORONARY ANGIOGRAPHY;  Surgeon: Sherren Mocha, MD;  Location: [facility];  Service: Cardiovascular;  Laterality: N/A;    Social History   Socioeconomic History   Marital status: Divorced    Spouse name: Not on file   Number of children: 2   Years of education: 13.5   Highest education level: 12th grade  Occupational History    Comment: retired  Tobacco Use   Smoking status: Never   Smokeless tobacco: Never  Vaping Use   Vaping Use: Never used  Substance and Sexual Activity   Alcohol use: Yes    Comment: Rare   Drug use: No   Sexual activity: Not Currently  Other Topics Concern   Not on file  Social History Narrative   Lives alone. Likes to crafts and paint in her free time.   Social Determinants of Health   Financial Resource Strain: Low Risk    Difficulty of Paying Living Expenses: Not hard at all  Food Insecurity: No Food Insecurity   Worried About Charity fundraiser in the Last Year: Never true   [place] in the Last Year: Never true  Transportation Needs: No Transportation Needs    Lack of Transportation (Medical): No   Lack of Transportation (Non-Medical): No  Physical Activity: Inactive   Days of Exercise per Week: 0 days   Minutes of Exercise per Session: 0 min  Stress: No Stress Concern Present   Feeling of Stress : Not at all  Social Connections: Moderately Isolated   Frequency of Communication with Friends and Family: More than three times a week   Frequency of Social Gatherings with Friends and Family: More than three times a week   Attends Religious Services: Never   Marine scientist or Organizations:  Yes   Attends Archivist Meetings: More than 4 times per year   Marital Status: Divorced  Human resources officer Violence: Not At Risk   Fear of Current or Ex-Partner: No   Emotionally Abused: No   Physically Abused: No   Sexually Abused: No    Family History  Problem Relation Age of Onset   Hypertension Mother    Hyperlipidemia Mother    Glaucoma Father    Heart disease Father    Hypertension Father    Diabetes Father    Hyperlipidemia Father    Skin cancer Father    Diabetes Paternal Grandmother    Colon cancer Neg Hx    Esophageal cancer Neg Hx     ROS: no fevers or chills, productive cough, hemoptysis, dysphasia, odynophagia, melena, hematochezia, dysuria, hematuria, rash, seizure activity, orthopnea, PND, pedal edema, claudication. Remaining systems are negative.  Physical Exam: Well-developed obese in no acute distress.  Skin is warm and dry.  HEENT is normal.  Neck is supple.  Chest is clear to auscultation with normal expansion.  Cardiovascular exam is regular rate and rhythm.  Abdominal exam nontender or distended. No masses palpated. Extremities show no edema. neuro grossly intact  A/P  1 paroxysmal atrial fibrillation-patient remains in sinus rhythm.  Continue beta-blocker at present dose.  Continue apixaban.  2 coronary artery disease-patient denies chest pain.  Continue statin.  Ideally would like to continue  Plavix for 1 year following previous infarct.  However she is having significant nosebleeds and I will therefore discontinue at this point.  3 hyperlipidemia-continue statin.  Check lipids and liver.  4 hypertension-blood pressure controlled.  Continue present medications.  Kirk Ruths, MD

## 2021-08-15 LAB — GLUCOSE, CAPILLARY: Glucose-Capillary: 156 mg/dL — ABNORMAL HIGH (ref 70–99)

## 2021-08-15 NOTE — Progress Notes (Signed)
Cardiac Individual Treatment Plan  Patient Details  Name: Tara Campbell MRN: [id number] Date of Birth: 04-22-1946 Referring Provider:   Flowsheet Row CARDIAC REHAB PHASE II ORIENTATION from 07/27/2021 in [place] [facility]  Referring Provider Kirk Ruths, MD       Initial Encounter Date:  [place] PHASE II ORIENTATION from 07/27/2021 in [place]  Date 07/27/21       Visit Diagnosis: 04/25/21 NSTEMI Medical Treatment  Patient's Home Medications on Admission:  Current Outpatient Medications:    albuterol (VENTOLIN HFA) 108 (90 Base) MCG/ACT inhaler, Inhale 2 puffs into the lungs every 6 (six) hours as needed for wheezing., Disp: 18 g, Rfl: prn   allopurinol (ZYLOPRIM) 300 MG tablet, Take 1 tablet (300 mg total) by mouth 2 (two) times daily. (Patient taking differently: Take 300 mg by mouth daily.), Disp: 180 tablet, Rfl: 1   apixaban (ELIQUIS) 5 MG TABS tablet, Take 1 tablet (5 mg total) by mouth 2 (two) times daily., Disp: 180 tablet, Rfl: 1   atenolol (TENORMIN) 100 MG tablet, Take 1 tablet (100 mg total) by mouth daily., Disp: 90 tablet, Rfl: 1   atorvastatin (LIPITOR) 80 MG tablet, Take 1 tablet (80 mg total) by mouth at bedtime., Disp: 90 tablet, Rfl: 3   blood glucose meter kit and supplies KIT, Check morning fasting blood glucose daily and up to 4 times daily as directed, Disp: 1 each, Rfl: 0   clopidogrel (PLAVIX) 75 MG tablet, Take 1 tablet (75 mg total) by mouth daily., Disp: 90 tablet, Rfl: 1   Cyanocobalamin (B-12 PO), Take 1,000 mcg by mouth daily., Disp: , Rfl:    EPINEPHrine 0.3 mg/0.3 mL IJ SOAJ injection, Inject 0.3 mg into the muscle as needed for anaphylaxis. Call 9-1-1 after use. (Patient not taking: Reported on 08/02/2021), Disp: 1 each, Rfl: 2   Evolocumab with Infusor ([place]) 420 MG/3.5ML SOCT, Inject 420 mg into the skin every 30 (thirty) days., Disp: 3.6 mL, Rfl:  11   ezetimibe (ZETIA) 10 MG tablet, TAKE ONE TABLET BY MOUTH EVERY DAY (Patient taking differently: Take 10 mg by mouth daily.), Disp: 90 tablet, Rfl: 0   fluticasone (FLONASE) 50 MCG/ACT nasal spray, Place 1 spray into both nostrils daily., Disp: 16 g, Rfl: 3   fluticasone furoate-vilanterol (BREO ELLIPTA) 200-25 MCG/INH AEPB, Inhale 1 puff into the lungs daily. Needs appt for further refills, Disp: 60 each, Rfl: 6   glucose blood test strip, To be used twice daily for testing blood sugars. E11.65, Disp: 200 each, Rfl: 11   ipratropium-albuterol (DUONEB) 0.5-2.5 (3) MG/3ML SOLN, Take 3 mLs by nebulization every 6 (six) hours as needed. (Patient taking differently: Take 3 mLs by nebulization every 6 (six) hours as needed (shortness of breath).), Disp: 3 mL, Rfl: PRN   irbesartan-hydrochlorothiazide (AVALIDE) 300-12.5 MG tablet, Take 1 tablet by mouth daily. (Patient taking differently: Take 0.5 tablets by mouth daily.), Disp: 90 tablet, Rfl: 1   isosorbide mononitrate (IMDUR) 60 MG 24 hr tablet, Take 1 tablet (60 mg total) by mouth daily., Disp: 90 tablet, Rfl: 1   levalbuterol (XOPENEX HFA) 45 MCG/ACT inhaler, Inhale 1-2 puffs into the lungs every 6 (six) hours as needed for wheezing or shortness of breath., Disp: , Rfl:    levothyroxine (SYNTHROID) 112 MCG tablet, Take 1 tablet (112 mcg total) by mouth daily before breakfast., Disp: 90 tablet, Rfl: 1   Mepolizumab ([name]) 100 MG/ML SOAJ, Inject 1  mL (100 mg total) into the skin every 28 (twenty-eight) days., Disp: 1 mL, Rfl: 5   metFORMIN (GLUCOPHAGE) 500 MG tablet, Take 2 tablets (1,000 mg total) by mouth 2 (two) times daily with a meal., Disp: 360 tablet, Rfl: 1   nitroGLYCERIN (NITROSTAT) 0.4 MG SL tablet, Place 1 tablet (0.4 mg total) under the tongue every 5 (five) minutes as needed for chest pain (or tightness). (Patient not taking: Reported on 08/02/2021), Disp: 30 tablet, Rfl: prn   Omega-3 Fatty Acids (FISH OIL PO), Take 1,000 Units by mouth  daily., Disp: , Rfl:    Semaglutide, 1 MG/DOSE, 4 MG/3ML SOPN, Inject 1 mg as directed once a week., Disp: 3 mL, Rfl: 4   SPIRIVA RESPIMAT 2.5 MCG/ACT AERS, Inhale 2 puffs into the lungs daily., Disp: 4 g, Rfl: 5  Past Medical History: Past Medical History:  Diagnosis Date   Allergy    Arthritis    Asthma    Colon polyps    Coronary artery disease    Diabetes mellitus without complication ([place])    Gout    Heart attack ([place]) 07/30/2016   PCI, 2 stents   Hyperlipidemia    Hypertension    Internal hemorrhoids    Left rotator cuff tear    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   Otomycosis of right ear 09/27/2017   Paroxysmal atrial fibrillation ([facility])    Thyroid disease     Tobacco Use: Social History   Tobacco Use  Smoking Status Never  Smokeless Tobacco Never    Labs: Recent Review Flowsheet Data     Labs for ITP Cardiac and Pulmonary Rehab Latest Ref Rng & Units 06/20/2020 10/03/2020 03/22/2021 03/24/2021 04/26/2021   Cholestrol 0 - 200 mg/dL - - 266(H) - 240(H)   LDLCALC 0 - 99 mg/dL - - 165(H) - 143(H)   HDL >40 mg/dL - - 56 - 47   Trlycerides <150 mg/dL - - 273(H) - 248(H)   Hemoglobin A1c 4.8 - 5.6 % 6.9(A) 7.8(A) - 8.7(A) 8.8(H)       Capillary Blood Glucose: Lab Results  Component Value Date   GLUCAP 87 08/14/2021   GLUCAP 156 (H) 08/14/2021   GLUCAP 126 (H) 07/31/2021   GLUCAP 169 (H) 07/31/2021   GLUCAP 109 (H) 04/27/2021     Exercise Target Goals: Exercise Program Goal: Individual exercise prescription set using results from initial 6 min walk test and THRR while considering  patient's activity barriers and safety.   Exercise Prescription Goal: Starting with aerobic activity 30 plus minutes a day, 3 days per week for initial exercise prescription. Provide home exercise prescription and guidelines that participant acknowledges understanding prior to discharge.  Activity Barriers & Risk Stratification:  Activity Barriers & Cardiac Risk Stratification -  07/27/21 1449       Activity Barriers & Cardiac Risk Stratification   Activity Barriers Arthritis;Joint Problems;Deconditioning;Shortness of Breath;Balance Concerns    Cardiac Risk Stratification High             6 Minute Walk:  6 Minute Walk     Row Name 07/27/21 1355         6 Minute Walk   Phase Initial     Distance 1249 feet     Walk Time 6 minutes     # of Rest Breaks 0     MPH 2.37     METS 2.52     RPE 13     Perceived Dyspnea  2     VO2  Peak 8.83     Symptoms Yes (comment)     Comments Pain: Right hip chronic, 4/10. SOB, RPD = 2     Resting HR 88 bpm     Resting BP 130/60     Resting Oxygen Saturation  98 %     Exercise Oxygen Saturation  during 6 min walk 97 %     Max Ex. HR 113 bpm     Max Ex. BP 176/78     2 Minute Post BP 150/68  Re-check 118/70              Oxygen Initial Assessment:   Oxygen Re-Evaluation:   Oxygen Discharge (Final Oxygen Re-Evaluation):   Initial Exercise Prescription:  Initial Exercise Prescription - 07/27/21 1400       Date of Initial Exercise RX and Referring Provider   Date 07/27/21    Referring Provider Kirk Ruths, MD    Expected Discharge Date 09/22/21      NuStep   Level 2    SPM 75    Minutes 30    METs 2      Prescription Details   Frequency (times per week) 3    Duration Progress to 30 minutes of continuous aerobic without signs/symptoms of physical distress      Intensity   THRR 40-80% of Max Heartrate 57-114    Ratings of Perceived Exertion 11-13    Perceived Dyspnea 0-4      Progression   Progression Continue progressive overload as per policy without signs/symptoms or physical distress.      Resistance Training   Training Prescription Yes    Weight 3 lbs    Reps 10-15             Perform Capillary Blood Glucose checks as needed.  Exercise Prescription Changes:   Exercise Prescription Changes     Row Name 07/31/21 1505 08/14/21 1500           Response to Exercise    Blood Pressure (Admit) 120/54 122/60      Blood Pressure (Exercise) 130/70 118/60      Blood Pressure (Exit) 102/62 110/60      Heart Rate (Admit) 84 bpm 79 bpm      Heart Rate (Exercise) 88 bpm 92 bpm      Heart Rate (Exit) 75 bpm 79 bpm      Rating of Perceived Exertion (Exercise) 11 9      Symptoms None None      Comments Pt's first day in the CRP2 program. Reviewed METs and home exercise Rx      Duration Progress to 30 minutes of  aerobic without signs/symptoms of physical distress Continue with 30 min of aerobic exercise without signs/symptoms of physical distress.      Intensity THRR unchanged THRR unchanged             Progression   Progression Continue to progress workloads to maintain intensity without signs/symptoms of physical distress. Continue to progress workloads to maintain intensity without signs/symptoms of physical distress.      Average METs 1.6 1.7             Resistance Training   Training Prescription Yes Yes      Weight 3 lbs 3 lbs      Reps 10-15 10-15      Time 10 Minutes 10 Minutes             Interval Training   Interval Training No No  NuStep   Level 2 2      SPM 75 75      Minutes 25 30      METs 1.6 1.7             Home Exercise Plan   Plans to continue exercise at -- Home (comment)      Frequency -- Add 2 additional days to program exercise sessions.      Initial Home Exercises Provided -- 08/14/21               Exercise Comments:   Exercise Comments     Row Name 07/31/21 1505 08/14/21 1559         Exercise Comments Pt's first day in the CRP2 program. Pt had no complaints with todays session. Pt retuned today after having CVOID-19 infection. Today is session 2 but patient wants to begin doing some addtional exercise at home. Reviewed home exercise Rx, Pt verbalized understanding of the Rx and was provided a copy.               Exercise Goals and Review:   Exercise Goals     Row Name 07/27/21 1449              Exercise Goals   Increase Physical Activity Yes       Intervention Provide advice, education, support and counseling about physical activity/exercise needs.;Develop an individualized exercise prescription for aerobic and resistive training based on initial evaluation findings, risk stratification, comorbidities and participant's personal goals.       Expected Outcomes Long Term: Add in home exercise to make exercise part of routine and to increase amount of physical activity.;Long Term: Exercising regularly at least 3-5 days a week.;Short Term: Attend rehab on a regular basis to increase amount of physical activity.       Increase Strength and Stamina Yes       Intervention Provide advice, education, support and counseling about physical activity/exercise needs.;Develop an individualized exercise prescription for aerobic and resistive training based on initial evaluation findings, risk stratification, comorbidities and participant's personal goals.       Expected Outcomes Long Term: Improve cardiorespiratory fitness, muscular endurance and strength as measured by increased METs and functional capacity (6MWT);Short Term: Perform resistance training exercises routinely during rehab and add in resistance training at home;Short Term: Increase workloads from initial exercise prescription for resistance, speed, and METs.       Able to understand and use rate of perceived exertion (RPE) scale Yes       Intervention Provide education and explanation on how to use RPE scale       Expected Outcomes Short Term: Able to use RPE daily in rehab to express subjective intensity level;Long Term:  Able to use RPE to guide intensity level when exercising independently       Knowledge and understanding of Target Heart Rate Range (THRR) Yes       Intervention Provide education and explanation of THRR including how the numbers were predicted and where they are located for reference       Expected Outcomes Short Term:  Able to state/look up THRR;Short Term: Able to use daily as guideline for intensity in rehab;Long Term: Able to use THRR to govern intensity when exercising independently       Understanding of Exercise Prescription Yes       Intervention Provide education, explanation, and written materials on patient's individual exercise prescription       Expected Outcomes Short  Term: Able to explain program exercise prescription;Long Term: Able to explain home exercise prescription to exercise independently                Exercise Goals Re-Evaluation :  Exercise Goals Re-Evaluation     Row Name 07/31/21 1505 08/14/21 1557           Exercise Goal Re-Evaluation   Exercise Goals Review Increase Physical Activity;Increase Strength and Stamina;Able to understand and use rate of perceived exertion (RPE) scale;Knowledge and understanding of Target Heart Rate Range (THRR);Understanding of Exercise Prescription Increase Physical Activity;Increase Strength and Stamina;Able to understand and use rate of perceived exertion (RPE) scale;Knowledge and understanding of Target Heart Rate Range (THRR);Able to check pulse independently;Understanding of Exercise Prescription      Comments Pt' first day in the CRP2 program. Pt understands the exercise Rx, THRR, and RPE sclae. Reviewed METs and home exercise Rx. Pt plans to walk 2x/week and uild up time to 30 minutes.      Expected Outcomes Will continue to monitor patient and progress exercise workloads as tolerated. Pt will exercise at home on her own by walking.                Discharge Exercise Prescription (Final Exercise Prescription Changes):  Exercise Prescription Changes - 08/14/21 1500       Response to Exercise   Blood Pressure (Admit) 122/60    Blood Pressure (Exercise) 118/60    Blood Pressure (Exit) 110/60    Heart Rate (Admit) 79 bpm    Heart Rate (Exercise) 92 bpm    Heart Rate (Exit) 79 bpm    Rating of Perceived Exertion (Exercise) 9     Symptoms None    Comments Reviewed METs and home exercise Rx    Duration Continue with 30 min of aerobic exercise without signs/symptoms of physical distress.    Intensity THRR unchanged      Progression   Progression Continue to progress workloads to maintain intensity without signs/symptoms of physical distress.    Average METs 1.7      Resistance Training   Training Prescription Yes    Weight 3 lbs    Reps 10-15    Time 10 Minutes      Interval Training   Interval Training No      NuStep   Level 2    SPM 75    Minutes 30    METs 1.7      Home Exercise Plan   Plans to continue exercise at Home (comment)    Frequency Add 2 additional days to program exercise sessions.    Initial Home Exercises Provided 08/14/21             Nutrition:  Target Goals: Understanding of nutrition guidelines, daily intake of sodium <15103m, cholesterol <2053m calories 30% from fat and 7% or less from saturated fats, daily to have 5 or more servings of fruits and vegetables.  Biometrics:  Pre Biometrics - 07/27/21 1310       Pre Biometrics   Waist Circumference 45.5 inches    Hip Circumference 46.75 inches    Waist to Hip Ratio 0.97 %    Triceps Skinfold 32 mm    % Body Fat 47.8 %    Grip Strength 16 kg    Flexibility 13.5 in    Single Leg Stand 3.25 seconds              Nutrition Therapy Plan and Nutrition Goals:  Nutrition Therapy & Goals -  08/14/21 0957       Nutrition Therapy   RD appointment deferred Yes             Nutrition Assessments:  MEDIFICTS Score Key: ?70 Need to make dietary changes  40-70 Heart Healthy Diet ? 40 Therapeutic Level Cholesterol Diet   Picture Your Plate Scores: <56 Unhealthy dietary pattern with much room for improvement. 41-50 Dietary pattern unlikely to meet recommendations for good health and room for improvement. 51-60 More healthful dietary pattern, with some room for improvement.  >60 Healthy dietary pattern, although  there may be some specific behaviors that could be improved.    Nutrition Goals Re-Evaluation:   Nutrition Goals Discharge (Final Nutrition Goals Re-Evaluation):   Psychosocial: Target Goals: Acknowledge presence or absence of significant depression and/or stress, maximize coping skills, provide positive support system. Participant is able to verbalize types and ability to use techniques and skills needed for reducing stress and depression.  Initial Review & Psychosocial Screening:  Initial Psych Review & Screening - 07/27/21 1346       Initial Review   Current issues with None Identified      Family Dynamics   Good Support System? Yes   Sandy lives alone. [name] has her sister, a newphew and a neighbor for support.     Barriers   Psychosocial barriers to participate in program There are no identifiable barriers or psychosocial needs.      Screening Interventions   Interventions Encouraged to exercise             Quality of Life Scores:  Quality of Life - 07/27/21 1443       Quality of Life   Select Quality of Life      Quality of Life Scores   Health/Function Pre 24.5 %    Socioeconomic Pre 25.83 %    Psych/Spiritual Pre 27.71 %    Family Pre 27 %    GLOBAL Pre 25.77 %            Scores of 19 and below usually indicate a poorer quality of life in these areas.  A difference of  2-3 points is a clinically meaningful difference.  A difference of 2-3 points in the total score of the Quality of Life Index has been associated with significant improvement in overall quality of life, self-image, physical symptoms, and general health in studies assessing change in quality of life.  PHQ-9: Recent Review Flowsheet Data     Depression screen [facility] 2/9 07/27/2021 06/23/2021 04/06/2021 03/24/2021 10/05/2019   Decreased Interest 0 - 0 0 0   Down, Depressed, Hopeless 0 2 0 0 0   PHQ - 2 Score 0 2 0 0 0      Interpretation of Total Score  Total Score Depression Severity:   1-4 = Minimal depression, 5-9 = Mild depression, 10-14 = Moderate depression, 15-19 = Moderately severe depression, 20-27 = Severe depression   Psychosocial Evaluation and Intervention:   Psychosocial Re-Evaluation:  Psychosocial Re-Evaluation     [place] Name 08/15/21 1459             Psychosocial Re-Evaluation   Current issues with None Identified       Comments Sandy returned to exercise at cardiac rehab after being absent with a COVID 19 infection. [name] did not voice any increased concerns or stressors on 08/15/21.       Interventions Encouraged to attend Cardiac Rehabilitation for the exercise       Continue Psychosocial Services  No Follow up required                Psychosocial Discharge (Final Psychosocial Re-Evaluation):  Psychosocial Re-Evaluation - 08/15/21 1459       Psychosocial Re-Evaluation   Current issues with None Identified    Comments Sandy returned to exercise at cardiac rehab after being absent with a COVID 19 infection. [name] did not voice any increased concerns or stressors on 08/15/21.    Interventions Encouraged to attend Cardiac Rehabilitation for the exercise    Continue Psychosocial Services  No Follow up required             Vocational Rehabilitation: Provide vocational rehab assistance to qualifying candidates.   Vocational Rehab Evaluation & Intervention:  Vocational Rehab - 07/27/21 1347       Initial Vocational Rehab Evaluation & Intervention   Assessment shows need for Vocational Rehabilitation No   [name] is retired and does not need vocational rehab at this time            Education: Education Goals: Education classes will be provided on a weekly basis, covering required topics. Participant will state understanding/return demonstration of topics presented.  Learning Barriers/Preferences:  Learning Barriers/Preferences - 07/27/21 1445       Learning Barriers/Preferences   Learning Barriers Sight;Hearing    Learning  Preferences Written Material;Computer/Internet             Education Topics: Hypertension, Hypertension Reduction -Define heart disease and high blood pressure. Discus how high blood pressure affects the body and ways to reduce high blood pressure.   Exercise and Your Heart -Discuss why it is important to exercise, the FITT principles of exercise, normal and abnormal responses to exercise, and how to exercise safely.   Angina -Discuss definition of angina, causes of angina, treatment of angina, and how to decrease risk of having angina.   Cardiac Medications -Review what the following cardiac medications are used for, how they affect the body, and side effects that may occur when taking the medications.  Medications include Aspirin, Beta blockers, calcium channel blockers, ACE Inhibitors, angiotensin receptor blockers, diuretics, digoxin, and antihyperlipidemics.   Congestive Heart Failure -Discuss the definition of CHF, how to live with CHF, the signs and symptoms of CHF, and how keep track of weight and sodium intake.   Heart Disease and Intimacy -Discus the effect sexual activity has on the heart, how changes occur during intimacy as we age, and safety during sexual activity.   Smoking Cessation / COPD -Discuss different methods to quit smoking, the health benefits of quitting smoking, and the definition of COPD.   Nutrition I: Fats -Discuss the types of cholesterol, what cholesterol does to the heart, and how cholesterol levels can be controlled.   Nutrition II: Labels -Discuss the different components of food labels and how to read food label   Heart Parts/Heart Disease and PAD -Discuss the anatomy of the heart, the pathway of blood circulation through the heart, and these are affected by heart disease.   Stress I: Signs and Symptoms -Discuss the causes of stress, how stress may lead to anxiety and depression, and ways to limit stress.   Stress II:  Relaxation -Discuss different types of relaxation techniques to limit stress.   Warning Signs of Stroke / TIA -Discuss definition of a stroke, what the signs and symptoms are of a stroke, and how to identify when someone is having stroke.   Knowledge Questionnaire Score:  Knowledge Questionnaire Score - 07/27/21 1444  Knowledge Questionnaire Score   Pre Score 20/24             Core Components/Risk Factors/Patient Goals at Admission:  Personal Goals and Risk Factors at Admission - 07/27/21 1444       Core Components/Risk Factors/Patient Goals on Admission    Weight Management Yes;Obesity;Weight Loss    Intervention Weight Management: Develop a combined nutrition and exercise program designed to reach desired caloric intake, while maintaining appropriate intake of nutrient and fiber, sodium and fats, and appropriate energy expenditure required for the weight goal.;Weight Management: Provide education and appropriate resources to help participant work on and attain dietary goals.;Weight Management/Obesity: Establish reasonable short term and long term weight goals.;Obesity: Provide education and appropriate resources to help participant work on and attain dietary goals.    Admit Weight 186 lb 11.7 oz (84.7 kg)    Expected Outcomes Short Term: Continue to assess and modify interventions until short term weight is achieved;Long Term: Adherence to nutrition and physical activity/exercise program aimed toward attainment of established weight goal;Weight Maintenance: Understanding of the daily nutrition guidelines, which includes 25-35% calories from fat, 7% or less cal from saturated fats, less than 270m cholesterol, less than 1.5gm of sodium, & 5 or more servings of fruits and vegetables daily;Weight Loss: Understanding of general recommendations for a balanced deficit meal plan, which promotes 1-2 lb weight loss per week and includes a negative energy balance of [phone]  kcal/d;Understanding recommendations for meals to include 15-35% energy as protein, 25-35% energy from fat, 35-60% energy from carbohydrates, less than 2041mof dietary cholesterol, 20-35 gm of total fiber daily;Understanding of distribution of calorie intake throughout the day with the consumption of 4-5 meals/snacks;Weight Gain: Understanding of general recommendations for a high calorie, high protein meal plan that promotes weight gain by distributing calorie intake throughout the day with the consumption for 4-5 meals, snacks, and/or supplements    Diabetes Yes    Intervention Provide education about signs/symptoms and action to take for hypo/hyperglycemia.;Provide education about proper nutrition, including hydration, and aerobic/resistive exercise prescription along with prescribed medications to achieve blood glucose in normal ranges: Fasting glucose 65-99 mg/dL    Expected Outcomes Short Term: Participant verbalizes understanding of the signs/symptoms and immediate care of hyper/hypoglycemia, proper foot care and importance of medication, aerobic/resistive exercise and nutrition plan for blood glucose control.;Long Term: Attainment of HbA1C < 7%.    Hypertension Yes    Intervention Provide education on lifestyle modifcations including regular physical activity/exercise, weight management, moderate sodium restriction and increased consumption of fresh fruit, vegetables, and low fat dairy, alcohol moderation, and smoking cessation.;Monitor prescription use compliance.    Expected Outcomes Short Term: Continued assessment and intervention until BP is < 140/9061mG in hypertensive participants. < 130/4m72m in hypertensive participants with diabetes, heart failure or chronic kidney disease.;Long Term: Maintenance of blood pressure at goal levels.    Lipids Yes    Intervention Provide education and support for participant on nutrition & aerobic/resistive exercise along with prescribed medications to  achieve LDL <70mg74mL >40mg.72mExpected Outcomes Short Term: Participant states understanding of desired cholesterol values and is compliant with medications prescribed. Participant is following exercise prescription and nutrition guidelines.;Long Term: Cholesterol controlled with medications as prescribed, with individualized exercise RX and with personalized nutrition plan. Value goals: LDL < 70mg, 22m> 40 mg.    Stress Yes    Intervention Offer individual and/or small group education and counseling on adjustment to heart disease, stress management and  health-related lifestyle change. Teach and support self-help strategies.;Refer participants experiencing significant psychosocial distress to appropriate mental health specialists for further evaluation and treatment. When possible, include family members and significant others in education/counseling sessions.    Expected Outcomes Short Term: Participant demonstrates changes in health-related behavior, relaxation and other stress management skills, ability to obtain effective social support, and compliance with psychotropic medications if prescribed.;Long Term: Emotional wellbeing is indicated by absence of clinically significant psychosocial distress or social isolation.             Core Components/Risk Factors/Patient Goals Review:   Goals and Risk Factor Review     Row Name 08/15/21 1502             Core Components/Risk Factors/Patient Goals Review   Personal Goals Review Weight Management/Obesity;Hypertension;Lipids;Diabetes;Stress       Review [name] returned to exercise after being absent with COVID 19. [name] did well for her second day of exercise on 08/14/21. Vital signs and CBG's were stable.       Expected Outcomes [name] will continue to participate in phase 2 cardiac rehab for exercise, nutrition and lifestyle modifications.                Core Components/Risk Factors/Patient Goals at Discharge (Final Review):   Goals  and Risk Factor Review - 08/15/21 1502       Core Components/Risk Factors/Patient Goals Review   Personal Goals Review Weight Management/Obesity;Hypertension;Lipids;Diabetes;Stress    Review [name] returned to exercise after being absent with COVID 19. [name] did well for her second day of exercise on 08/14/21. Vital signs and CBG's were stable.    Expected Outcomes [name] will continue to participate in phase 2 cardiac rehab for exercise, nutrition and lifestyle modifications.             ITP Comments:  ITP Comments     Row Name 07/27/21 1341 08/15/21 1457         ITP Comments Dr Fransico Him MD, Medical Director 30 Day ITP Review. Sandy returned to exercise on 08/14/21 after being out with COVID 19. [name] did well on her second day of exercise.               Comments: See ITP comments.Harrell Gave RN BSN

## 2021-08-16 ENCOUNTER — Encounter (HOSPITAL_COMMUNITY): Payer: PPO

## 2021-08-16 ENCOUNTER — Ambulatory Visit: Payer: PPO | Admitting: Cardiology

## 2021-08-16 ENCOUNTER — Encounter: Payer: Self-pay | Admitting: Cardiology

## 2021-08-16 ENCOUNTER — Other Ambulatory Visit: Payer: Self-pay

## 2021-08-16 VITALS — BP 138/62 | HR 77 | Ht 62.5 in | Wt 187.1 lb

## 2021-08-16 DIAGNOSIS — I480 Paroxysmal atrial fibrillation: Secondary | ICD-10-CM | POA: Diagnosis not present

## 2021-08-16 DIAGNOSIS — E782 Mixed hyperlipidemia: Secondary | ICD-10-CM | POA: Diagnosis not present

## 2021-08-16 DIAGNOSIS — I2510 Atherosclerotic heart disease of native coronary artery without angina pectoris: Secondary | ICD-10-CM | POA: Diagnosis not present

## 2021-08-16 DIAGNOSIS — I10 Essential (primary) hypertension: Secondary | ICD-10-CM | POA: Diagnosis not present

## 2021-08-16 NOTE — Patient Instructions (Signed)
Medication Instructions:   DISCONTINUE Plavix. *If you need a refill on your cardiac medications before your next appointment, please call your pharmacy*   Lab Work: Your physician recommends that you return for a FASTING lipid profile/lft.   If you have labs (blood work) drawn today and your tests are completely normal, you will receive your results only by: MyChart Message (if you have MyChart) OR A paper copy in the mail If you have any lab test that is abnormal or we need to change your treatment, we will call you to review the results.   Testing/Procedures: -None   Follow-Up: At [facility], you and your health needs are our priority.  As part of our continuing mission to provide you with exceptional heart care, we have created designated Provider Care Teams.  These Care Teams include your primary Cardiologist (physician) and Advanced Practice Providers (APPs -  Physician Assistants and Nurse Practitioners) who all work together to provide you with the care you need, when you need it.  We recommend signing up for the patient portal called "MyChart".  Sign up information is provided on this After Visit Summary.  MyChart is used to connect with patients for Virtual Visits (Telemedicine).  Patients are able to view lab/test results, encounter notes, upcoming appointments, etc.  Non-urgent messages can be sent to your provider as well.   To learn more about what you can do with MyChart, go to [email or web address].    Your next appointment:   6 month(s)  The format for your next appointment:   In Person  Provider:   Dr.Crenshaw.   Other Instructions

## 2021-08-18 ENCOUNTER — Encounter (HOSPITAL_COMMUNITY)
Admission: RE | Admit: 2021-08-18 | Discharge: 2021-08-18 | Disposition: A | Discharge status: Home / Self Care | Payer: PPO | Source: Ambulatory Visit | Attending: Cardiology | Admitting: Cardiology

## 2021-08-18 ENCOUNTER — Other Ambulatory Visit: Payer: Self-pay

## 2021-08-18 DIAGNOSIS — I214 Non-ST elevation (NSTEMI) myocardial infarction: Secondary | ICD-10-CM | POA: Diagnosis not present

## 2021-08-21 ENCOUNTER — Other Ambulatory Visit: Payer: Self-pay

## 2021-08-21 ENCOUNTER — Encounter (HOSPITAL_COMMUNITY)
Admission: RE | Admit: 2021-08-21 | Discharge: 2021-08-21 | Disposition: A | Discharge status: Home / Self Care | Payer: PPO | Source: Ambulatory Visit | Attending: Cardiology | Admitting: Cardiology

## 2021-08-21 DIAGNOSIS — I214 Non-ST elevation (NSTEMI) myocardial infarction: Secondary | ICD-10-CM | POA: Diagnosis not present

## 2021-08-21 NOTE — Progress Notes (Signed)
[name] 75 y.o. 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   Current Outpatient Medications:    albuterol (VENTOLIN HFA) 108 (90 Base) MCG/ACT inhaler, Inhale 2 puffs into the lungs every 6 (six) hours as needed for wheezing., Disp: 18 g, Rfl: prn   allopurinol (ZYLOPRIM) 300 MG tablet, Take 1 tablet (300 mg total) by mouth 2 (two) times daily. (Patient taking differently: Take 300 mg by mouth daily.), Disp: 180 tablet, Rfl: 1   apixaban (ELIQUIS) 5 MG TABS tablet, Take 1 tablet (5 mg total) by mouth 2 (two) times daily., Disp: 180 tablet, Rfl: 1   atenolol (TENORMIN) 100 MG tablet, Take 1 tablet (100 mg total) by mouth daily., Disp: 90 tablet, Rfl: 1   atorvastatin (LIPITOR) 80 MG tablet, Take 1 tablet (80 mg total) by mouth at bedtime., Disp: 90 tablet, Rfl: 3   blood glucose meter kit and supplies KIT, Check morning fasting blood glucose daily and up to 4 times daily as directed, Disp: 1 each, Rfl: 0   Cyanocobalamin (B-12 PO), Take 1,000 mcg by mouth daily., Disp: , Rfl:    EPINEPHrine 0.3 mg/0.3 mL IJ SOAJ injection, Inject 0.3 mg into the muscle as needed for anaphylaxis. Call 9-1-1 after use., Disp: 1 each, Rfl: 2   Evolocumab with Infusor ([place]) 420 MG/3.5ML SOCT, Inject 420 mg into the skin every 30 (thirty) days., Disp: 3.6 mL, Rfl: 11   ezetimibe (ZETIA) 10 MG tablet, TAKE ONE TABLET BY MOUTH EVERY DAY (Patient taking differently: Take 10 mg by mouth daily.), Disp: 90 tablet, Rfl:  0   fluticasone (FLONASE) 50 MCG/ACT nasal spray, Place 1 spray into both nostrils daily., Disp: 16 g, Rfl: 3   fluticasone furoate-vilanterol (BREO ELLIPTA) 200-25 MCG/INH AEPB, Inhale 1 puff into the lungs daily. Needs appt for further refills, Disp: 60 each, Rfl: 6   glucose blood test strip, To be used twice daily for testing blood sugars. E11.65, Disp: 200 each, Rfl: 11   ipratropium-albuterol (DUONEB) 0.5-2.5 (3) MG/3ML SOLN, Take 3 mLs by nebulization every 6 (six) hours as needed. (Patient taking differently: Take 3 mLs by nebulization every 6 (six) hours as needed (shortness of breath).), Disp: 3 mL, Rfl: PRN   irbesartan-hydrochlorothiazide (AVALIDE) 300-12.5 MG tablet, Take 1 tablet by mouth daily. (Patient taking differently: Take 0.5 tablets by mouth daily.), Disp: 90 tablet, Rfl: 1   isosorbide mononitrate (IMDUR) 60 MG 24 hr tablet, Take 1 tablet (60 mg total) by mouth daily., Disp: 90 tablet, Rfl: 1   levalbuterol (XOPENEX HFA) 45 MCG/ACT inhaler, Inhale 1-2 puffs into the lungs every 6 (six) hours as needed for wheezing or shortness of breath., Disp: , Rfl:    levothyroxine (SYNTHROID) 112 MCG tablet, Take 1 tablet (112 mcg total) by mouth daily before breakfast., Disp: 90 tablet, Rfl: 1   Mepolizumab ([name]) 100 MG/ML SOAJ, Inject 1  mL (100 mg total) into the skin every 28 (twenty-eight) days., Disp: 1 mL, Rfl: 5   metFORMIN (GLUCOPHAGE) 500 MG tablet, Take 2 tablets (1,000 mg total) by mouth 2 (two) times daily with a meal., Disp: 360 tablet, Rfl: 1   nitroGLYCERIN (NITROSTAT) 0.4 MG SL tablet, Place 1 tablet (0.4 mg total) under the tongue every 5 (five) minutes as needed for chest pain (or tightness)., Disp: 30 tablet, Rfl: prn   Omega-3 Fatty Acids (FISH OIL PO), Take 1,000 Units by mouth daily., Disp: , Rfl:    Semaglutide, 1 MG/DOSE, 4 MG/3ML SOPN, Inject 1 mg as directed once a week., Disp: 3 mL, Rfl: 4   SPIRIVA RESPIMAT 2.5 MCG/ACT AERS, Inhale 2 puffs into the lungs daily.,  Disp: 4 g, Rfl: 5   TRULICITY 1.5 FF/6.3WG SOPN, Inject 1.5 mg into the skin once a week., Disp: , Rfl:    Ht Readings from Last 1 Encounters:  08/16/21 5' 2.5" (1.588 m)     Wt Readings from Last 3 Encounters:  08/16/21 187 lb 1.9 oz (84.9 kg)  08/02/21 186 lb (84.4 kg)  07/27/21 186 lb 11.7 oz (84.7 kg)     There is no height or weight on file to calculate BMI.   Social History   Tobacco Use  Smoking Status Never  Smokeless Tobacco Never     Lab Results  Component Value Date   CHOL 240 (H) 04/26/2021   Lab Results  Component Value Date   HDL 47 04/26/2021   Lab Results  Component Value Date   LDLCALC 143 (H) 04/26/2021   Lab Results  Component Value Date   TRIG 248 (H) 04/26/2021     Lab Results  Component Value Date   HGBA1C 8.8 (H) 04/26/2021     CBG (last 3)  No results for input(s): GLUCAP in the last 72 hours.   Nutrition Note  Spoke with pt. Nutrition Plan and Nutrition Survey goals reviewed with pt. Pt is following a Heart Healthy diet. Pt wants to lose wt. Pt has been trying to lose wt by reducing portions and choosing lean proteins. Wt loss tips reviewed (label reading, how to build a healthy plate, portion sizes, eating frequently across the day).  LDL elevated. Reviewed lipid management nutrition therapy including reduced saturated fats and incorporating 10-15 g soluble fiber.  Pt has Type 2 Diabetes. Pt reports having A1C checked in June/July and it was 7.1% Pt checks CBG's 1 times a day. Fasting CBG's reportedly 100-130 mg/dL.   Per discussion, pt does not use canned/convenience foods often. Pt does add salt to food. Pt does not eat out frequently.   Pt expressed understanding of the information reviewed.    Nutrition Diagnosis Food-and nutrition-related knowledge deficit related to lack of exposure to information as related to diagnosis of: ? CVD ? Type 2 Diabetes  Nutrition Intervention Pt's individual nutrition plan reviewed with  pt. Benefits of adopting Heart Healthy diet discussed when Picture Your Plate reviewed.   Pt given handouts for: ? Nutrition I class ? Nutrition II class ?  Continue client-centered nutrition education by RD, as part of interdisciplinary care.  Goal(s)  Pt to identify food quantities necessary to achieve weight loss of 6-24 lb at graduation from cardiac rehab.  Pt to build a healthy plate including vegetables, fruits, whole grains, and low-fat dairy products in a heart healthy meal plan.  Plan:  Will provide client-centered nutrition education as part of interdisciplinary care Monitor and evaluate progress  toward nutrition goal with team.   Michaele Offer, MS, RDN, LDN

## 2021-08-23 ENCOUNTER — Other Ambulatory Visit: Payer: Self-pay

## 2021-08-23 ENCOUNTER — Encounter (HOSPITAL_COMMUNITY)
Admission: RE | Admit: 2021-08-23 | Discharge: 2021-08-23 | Disposition: A | Discharge status: Home / Self Care | Payer: PPO | Source: Ambulatory Visit | Attending: Cardiology | Admitting: Cardiology

## 2021-08-23 ENCOUNTER — Other Ambulatory Visit: Payer: PPO

## 2021-08-23 ENCOUNTER — Ambulatory Visit: Payer: PPO

## 2021-08-23 DIAGNOSIS — I214 Non-ST elevation (NSTEMI) myocardial infarction: Secondary | ICD-10-CM | POA: Diagnosis not present

## 2021-08-25 ENCOUNTER — Encounter (HOSPITAL_COMMUNITY): Payer: PPO

## 2021-08-25 ENCOUNTER — Other Ambulatory Visit: Payer: Self-pay

## 2021-08-25 ENCOUNTER — Encounter (HOSPITAL_COMMUNITY)
Admission: RE | Admit: 2021-08-25 | Discharge: 2021-08-25 | Disposition: A | Discharge status: Home / Self Care | Payer: PPO | Source: Ambulatory Visit | Attending: Cardiology | Admitting: Cardiology

## 2021-08-25 ENCOUNTER — Ambulatory Visit: Payer: PPO | Admitting: Family Medicine

## 2021-08-25 DIAGNOSIS — I214 Non-ST elevation (NSTEMI) myocardial infarction: Secondary | ICD-10-CM | POA: Diagnosis not present

## 2021-08-28 ENCOUNTER — Encounter (HOSPITAL_COMMUNITY)
Admission: RE | Admit: 2021-08-28 | Discharge: 2021-08-28 | Disposition: A | Discharge status: Home / Self Care | Payer: PPO | Source: Ambulatory Visit | Attending: Cardiology | Admitting: Cardiology

## 2021-08-28 ENCOUNTER — Other Ambulatory Visit: Payer: Self-pay

## 2021-08-28 DIAGNOSIS — I214 Non-ST elevation (NSTEMI) myocardial infarction: Secondary | ICD-10-CM

## 2021-08-30 ENCOUNTER — Encounter (HOSPITAL_COMMUNITY)
Admission: RE | Admit: 2021-08-30 | Discharge: 2021-08-30 | Disposition: A | Discharge status: Home / Self Care | Payer: PPO | Source: Ambulatory Visit | Attending: Cardiology | Admitting: Cardiology

## 2021-08-30 ENCOUNTER — Other Ambulatory Visit: Payer: Self-pay

## 2021-08-30 DIAGNOSIS — I214 Non-ST elevation (NSTEMI) myocardial infarction: Secondary | ICD-10-CM | POA: Diagnosis not present

## 2021-09-01 ENCOUNTER — Encounter (HOSPITAL_COMMUNITY)
Admission: RE | Admit: 2021-09-01 | Discharge: 2021-09-01 | Disposition: A | Discharge status: Home / Self Care | Payer: PPO | Source: Ambulatory Visit | Attending: Cardiology | Admitting: Cardiology

## 2021-09-01 ENCOUNTER — Other Ambulatory Visit: Payer: Self-pay

## 2021-09-01 DIAGNOSIS — E1169 Type 2 diabetes mellitus with other specified complication: Secondary | ICD-10-CM | POA: Diagnosis not present

## 2021-09-01 DIAGNOSIS — I480 Paroxysmal atrial fibrillation: Secondary | ICD-10-CM

## 2021-09-01 DIAGNOSIS — E785 Hyperlipidemia, unspecified: Secondary | ICD-10-CM | POA: Diagnosis not present

## 2021-09-01 DIAGNOSIS — I10 Essential (primary) hypertension: Secondary | ICD-10-CM

## 2021-09-01 DIAGNOSIS — E1165 Type 2 diabetes mellitus with hyperglycemia: Secondary | ICD-10-CM | POA: Diagnosis not present

## 2021-09-01 DIAGNOSIS — I214 Non-ST elevation (NSTEMI) myocardial infarction: Secondary | ICD-10-CM | POA: Diagnosis not present

## 2021-09-04 ENCOUNTER — Other Ambulatory Visit: Payer: Self-pay

## 2021-09-04 ENCOUNTER — Encounter (HOSPITAL_COMMUNITY)
Admission: RE | Admit: 2021-09-04 | Discharge: 2021-09-04 | Disposition: A | Discharge status: Home / Self Care | Payer: PPO | Source: Ambulatory Visit | Attending: Cardiology | Admitting: Cardiology

## 2021-09-04 DIAGNOSIS — I10 Essential (primary) hypertension: Secondary | ICD-10-CM | POA: Diagnosis not present

## 2021-09-04 DIAGNOSIS — E782 Mixed hyperlipidemia: Secondary | ICD-10-CM | POA: Diagnosis not present

## 2021-09-04 DIAGNOSIS — Z5189 Encounter for other specified aftercare: Secondary | ICD-10-CM | POA: Diagnosis not present

## 2021-09-04 DIAGNOSIS — I252 Old myocardial infarction: Secondary | ICD-10-CM | POA: Insufficient documentation

## 2021-09-04 DIAGNOSIS — I2510 Atherosclerotic heart disease of native coronary artery without angina pectoris: Secondary | ICD-10-CM | POA: Diagnosis not present

## 2021-09-04 DIAGNOSIS — I214 Non-ST elevation (NSTEMI) myocardial infarction: Secondary | ICD-10-CM

## 2021-09-04 DIAGNOSIS — I480 Paroxysmal atrial fibrillation: Secondary | ICD-10-CM | POA: Diagnosis not present

## 2021-09-05 LAB — HEPATIC FUNCTION PANEL
AG Ratio: 1.8 (calc) (ref 1.0–2.5)
ALT: 12 U/L (ref 6–29)
AST: 11 U/L (ref 10–35)
Albumin: 4.1 g/dL (ref 3.6–5.1)
Alkaline phosphatase (APISO): 88 U/L (ref 37–153)
Bilirubin, Direct: 0.1 mg/dL (ref 0.0–0.2)
Globulin: 2.3 g/dL (calc) (ref 1.9–3.7)
Indirect Bilirubin: 0.3 mg/dL (calc) (ref 0.2–1.2)
Total Bilirubin: 0.4 mg/dL (ref 0.2–1.2)
Total Protein: 6.4 g/dL (ref 6.1–8.1)

## 2021-09-05 LAB — LIPID PANEL
Cholesterol: 140 mg/dL (ref <200)
HDL: 44 mg/dL — ABNORMAL LOW (ref >=50)
LDL Cholesterol (Calc): 68 mg/dL (calc)
Non-HDL Cholesterol (Calc): 96 mg/dL (calc) (ref <130)
Total CHOL/HDL Ratio: 3.2 (calc) (ref <5.0)
Triglycerides: 222 mg/dL — ABNORMAL HIGH (ref <150)

## 2021-09-06 ENCOUNTER — Ambulatory Visit (INDEPENDENT_AMBULATORY_CARE_PROVIDER_SITE_OTHER): Payer: PPO | Admitting: Family Medicine

## 2021-09-06 ENCOUNTER — Encounter: Payer: Self-pay | Admitting: Family Medicine

## 2021-09-06 ENCOUNTER — Encounter (HOSPITAL_COMMUNITY)
Admission: RE | Admit: 2021-09-06 | Discharge: 2021-09-06 | Disposition: A | Discharge status: Home / Self Care | Payer: PPO | Source: Ambulatory Visit | Attending: Cardiology | Admitting: Cardiology

## 2021-09-06 ENCOUNTER — Other Ambulatory Visit: Payer: Self-pay

## 2021-09-06 VITALS — BP 121/52 | HR 73 | Ht 63.0 in | Wt 188.0 lb

## 2021-09-06 DIAGNOSIS — Z23 Encounter for immunization: Secondary | ICD-10-CM

## 2021-09-06 DIAGNOSIS — I152 Hypertension secondary to endocrine disorders: Secondary | ICD-10-CM

## 2021-09-06 DIAGNOSIS — E1159 Type 2 diabetes mellitus with other circulatory complications: Secondary | ICD-10-CM

## 2021-09-06 DIAGNOSIS — E118 Type 2 diabetes mellitus with unspecified complications: Secondary | ICD-10-CM

## 2021-09-06 DIAGNOSIS — Z5189 Encounter for other specified aftercare: Secondary | ICD-10-CM | POA: Diagnosis not present

## 2021-09-06 DIAGNOSIS — Z6833 Body mass index (BMI) 33.0-33.9, adult: Secondary | ICD-10-CM | POA: Diagnosis not present

## 2021-09-06 DIAGNOSIS — E669 Obesity, unspecified: Secondary | ICD-10-CM | POA: Insufficient documentation

## 2021-09-06 DIAGNOSIS — I214 Non-ST elevation (NSTEMI) myocardial infarction: Secondary | ICD-10-CM

## 2021-09-06 DIAGNOSIS — I480 Paroxysmal atrial fibrillation: Secondary | ICD-10-CM

## 2021-09-06 DIAGNOSIS — E039 Hypothyroidism, unspecified: Secondary | ICD-10-CM | POA: Diagnosis not present

## 2021-09-06 DIAGNOSIS — I10 Essential (primary) hypertension: Secondary | ICD-10-CM

## 2021-09-06 LAB — POCT GLYCOSYLATED HEMOGLOBIN (HGB A1C): Hemoglobin A1C: 6.5 % — AB (ref 4.0–5.6)

## 2021-09-06 MED ORDER — APIXABAN 5 MG PO TABS: 5.0000 mg | ORAL_TABLET | Freq: Two times a day (BID) | ORAL | 1 refills | Status: DC

## 2021-09-06 MED ORDER — ATENOLOL 100 MG PO TABS: 100.0000 mg | ORAL_TABLET | Freq: Every day | ORAL | 1 refills | Status: DC

## 2021-09-06 MED ORDER — IRBESARTAN-HYDROCHLOROTHIAZIDE 300-12.5 MG PO TABS: 0.5000 | ORAL_TABLET | Freq: Every day | ORAL | 1 refills | Status: DC

## 2021-09-06 MED ORDER — SEMAGLUTIDE (1 MG/DOSE) 4 MG/3ML ~~LOC~~ SOPN: 1.0000 mg | PEN_INJECTOR | SUBCUTANEOUS | 3 refills | Status: DC

## 2021-09-06 MED ORDER — LEVOTHYROXINE SODIUM 112 MCG PO TABS: 112.0000 ug | ORAL_TABLET | Freq: Every day | ORAL | 1 refills | Status: DC

## 2021-09-06 NOTE — Assessment & Plan Note (Signed)
Well controlled. Continue current regimen. Follow up in  6 mo  

## 2021-09-06 NOTE — Progress Notes (Signed)
Pt wanted to discuss her recent labs and how much they have improved.

## 2021-09-06 NOTE — Progress Notes (Signed)
Established Patient Office Visit  Subjective:  Patient ID: Tara Campbell, female    DOB: 1945/12/05  Age: 75 y.o. MRN: [id number]  CC:  Chief Complaint  Patient presents with   Diabetes    HPI Tara Campbell presents for   Diabetes - no hypoglycemic events. No wounds or sores that are not healing well. No increased thirst or urination. Checking glucose at home. Taking medications as prescribed without any side effects.  She is currently on Ozempic up to 0.5 mg and doing well she has not had any significant problems or nausea.  Did have some labs checked through her cardiologist and brought those in with her today to go over.  Overall they actually look really good.  She is doing really well on the [place].  Hypertension- Pt denies chest pain, SOB, dizziness, or heart palpitations.  Taking meds as directed w/o problems.  Denies medication side effects.    Has really done a great job with making some changes she has been going to cardiac rehab regularly.  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   Otomycosis of right ear 09/27/2017   Paroxysmal atrial fibrillation ([place])    Thyroid disease     Past Surgical History:  Procedure Laterality Date   ANGIOPLASTY     CARDIAC CATHETERIZATION     COLONOSCOPY W/ POLYPECTOMY  01/04/2015   HEMORRHOID BANDING     LEFT HEART CATH AND CORONARY ANGIOGRAPHY N/A 04/26/2021   Procedure: LEFT HEART CATH AND CORONARY ANGIOGRAPHY;  Surgeon: Sherren Mocha, MD;  Location: [facility];  Service: Cardiovascular;  Laterality: N/A;    Family History  Problem Relation Age of Onset   Hypertension Mother    Hyperlipidemia Mother     Glaucoma Father    Heart disease Father    Hypertension Father    Diabetes Father    Hyperlipidemia Father    Skin cancer Father    Diabetes Paternal Grandmother    Colon cancer Neg Hx    Esophageal cancer Neg Hx     Social History   Socioeconomic History   Marital status: Divorced    Spouse name: Not on file   Number of children: 2   Years of education: 13.5   Highest education level: 12th grade  Occupational History    Comment: retired  Tobacco Use   Smoking status: Never   Smokeless tobacco: Never  Vaping Use   Vaping Use: Never used  Substance and Sexual Activity   Alcohol use: Yes    Comment: Rare   Drug use: No   Sexual activity: Not Currently  Other Topics Concern   Not on file  Social History Narrative   Lives alone. Likes to crafts and paint in her free time.   Social Determinants of Health   Financial Resource Strain: Low Risk    Difficulty of Paying Living Expenses: Not hard at all  Food Insecurity: No Food Insecurity   Worried About Charity fundraiser in the Last Year: Never true   [place] in the Last Year:  Never true  Transportation Needs: No Transportation Needs   Lack of Transportation (Medical): No   Lack of Transportation (Non-Medical): No  Physical Activity: Inactive   Days of Exercise per Week: 0 days   Minutes of Exercise per Session: 0 min  Stress: No Stress Concern Present   Feeling of Stress : Not at all  Social Connections: Moderately Isolated   Frequency of Communication with Friends and Family: More than three times a week   Frequency of Social Gatherings with Friends and Family: More than three times a week   Attends Religious Services: Never   Marine scientist or Organizations: Yes   Attends Music therapist: More than 4 times per year   Marital Status: Divorced  Human resources officer Violence: Not At Risk   Fear of Current or Ex-Partner: No   Emotionally Abused: No   Physically Abused: No   Sexually  Abused: No    Outpatient Medications Prior to Visit  Medication Sig Dispense Refill   albuterol (VENTOLIN HFA) 108 (90 Base) MCG/ACT inhaler Inhale 2 puffs into the lungs every 6 (six) hours as needed for wheezing. 18 g prn   allopurinol (ZYLOPRIM) 300 MG tablet Take 1 tablet (300 mg total) by mouth 2 (two) times daily. (Patient taking differently: Take 300 mg by mouth daily.) 180 tablet 1   atorvastatin (LIPITOR) 80 MG tablet Take 1 tablet (80 mg total) by mouth at bedtime. 90 tablet 3   blood glucose meter kit and supplies KIT Check morning fasting blood glucose daily and up to 4 times daily as directed 1 each 0   Cyanocobalamin (B-12 PO) Take 1,000 mcg by mouth daily.     EPINEPHrine 0.3 mg/0.3 mL IJ SOAJ injection Inject 0.3 mg into the muscle as needed for anaphylaxis. Call 9-1-1 after use. 1 each 2   Evolocumab with Infusor ([place]) 420 MG/3.5ML SOCT Inject 420 mg into the skin every 30 (thirty) days. 3.6 mL 11   ezetimibe (ZETIA) 10 MG tablet TAKE ONE TABLET BY MOUTH EVERY DAY (Patient taking differently: Take 10 mg by mouth daily.) 90 tablet 0   fluticasone (FLONASE) 50 MCG/ACT nasal spray Place 1 spray into both nostrils daily. 16 g 3   fluticasone furoate-vilanterol (BREO ELLIPTA) 200-25 MCG/INH AEPB Inhale 1 puff into the lungs daily. Needs appt for further refills 60 each 6   glucose blood test strip To be used twice daily for testing blood sugars. E11.65 200 each 11   ipratropium-albuterol (DUONEB) 0.5-2.5 (3) MG/3ML SOLN Take 3 mLs by nebulization every 6 (six) hours as needed. (Patient taking differently: Take 3 mLs by nebulization every 6 (six) hours as needed (shortness of breath).) 3 mL PRN   isosorbide mononitrate (IMDUR) 60 MG 24 hr tablet Take 1 tablet (60 mg total) by mouth daily. 90 tablet 1   levalbuterol (XOPENEX HFA) 45 MCG/ACT inhaler Inhale 1-2 puffs into the lungs every 6 (six) hours as needed for wheezing or shortness of breath.     Mepolizumab  ([name]) 100 MG/ML SOAJ Inject 1 mL (100 mg total) into the skin every 28 (twenty-eight) days. 1 mL 5   metFORMIN (GLUCOPHAGE) 500 MG tablet Take 2 tablets (1,000 mg total) by mouth 2 (two) times daily with a meal. 360 tablet 1   nitroGLYCERIN (NITROSTAT) 0.4 MG SL tablet Place 1 tablet (0.4 mg total) under the tongue every 5 (five) minutes as needed for chest pain (or tightness). 30 tablet prn   Omega-3 Fatty Acids (  FISH OIL PO) Take 1,000 Units by mouth daily.     SPIRIVA RESPIMAT 2.5 MCG/ACT AERS Inhale 2 puffs into the lungs daily. 4 g 5   apixaban (ELIQUIS) 5 MG TABS tablet Take 1 tablet (5 mg total) by mouth 2 (two) times daily. 180 tablet 1   atenolol (TENORMIN) 100 MG tablet Take 1 tablet (100 mg total) by mouth daily. 90 tablet 1   irbesartan-hydrochlorothiazide (AVALIDE) 300-12.5 MG tablet Take 1 tablet by mouth daily. (Patient taking differently: Take 0.5 tablets by mouth daily.) 90 tablet 1   levothyroxine (SYNTHROID) 112 MCG tablet Take 1 tablet (112 mcg total) by mouth daily before breakfast. 90 tablet 1   TRULICITY 1.5 CB/6.3AG SOPN Inject 1.5 mg into the skin once a week.     Semaglutide, 1 MG/DOSE, 4 MG/3ML SOPN Inject 1 mg as directed once a week. 3 mL 4   No facility-administered medications prior to visit.    Allergies  Allergen Reactions   Latex Hives   Sulfa Antibiotics Rash    ROS Review of Systems    Objective:    Physical Exam Constitutional:      Appearance: Normal appearance. She is well-developed.  HENT:     Head: Normocephalic and atraumatic.  Cardiovascular:     Rate and Rhythm: Normal rate and regular rhythm.     Heart sounds: Normal heart sounds.  Pulmonary:     Effort: Pulmonary effort is normal.     Breath sounds: Normal breath sounds.  Skin:    General: Skin is warm and dry.  Neurological:     Mental Status: She is alert and oriented to person, place, and time.  Psychiatric:        Behavior: Behavior normal.    BP (!) 121/52   Pulse 73    Ht '5\' 3"'  (1.6 m)   Wt 188 lb (85.3 kg)   SpO2 100%   BMI 33.30 kg/m  Wt Readings from Last 3 Encounters:  09/06/21 188 lb (85.3 kg)  08/16/21 187 lb 1.9 oz (84.9 kg)  08/02/21 186 lb (84.4 kg)     Health Maintenance Due  Topic Date Due   COVID-19 Vaccine (5 - Booster for Pfizer series) 07/24/2021    There are no preventive care reminders to display for this patient.  Lab Results  Component Value Date   TSH 2.65 04/24/2021   Lab Results  Component Value Date   WBC 7.2 04/27/2021   HGB 10.9 (L) 04/27/2021   HCT 32.4 (L) 04/27/2021   MCV 87.6 04/27/2021   PLT 265 04/27/2021   Lab Results  Component Value Date   NA 135 04/27/2021   K 4.2 04/27/2021   CO2 23 04/27/2021   GLUCOSE 208 (H) 04/27/2021   BUN 16 04/27/2021   CREATININE 1.11 (H) 04/27/2021   BILITOT 0.4 09/04/2021   ALKPHOS 100 05/29/2017   AST 11 09/04/2021   ALT 12 09/04/2021   PROT 6.4 09/04/2021   ALBUMIN 4.2 05/29/2017   CALCIUM 9.3 04/27/2021   ANIONGAP 9 04/27/2021   Lab Results  Component Value Date   CHOL 140 09/04/2021   Lab Results  Component Value Date   HDL 44 (L) 09/04/2021   Lab Results  Component Value Date   LDLCALC 68 09/04/2021   Lab Results  Component Value Date   TRIG 222 (H) 09/04/2021   Lab Results  Component Value Date   CHOLHDL 3.2 09/04/2021   Lab Results  Component Value Date   HGBA1C  6.5 (A) 09/06/2021      Assessment & Plan:   Problem List Items Addressed This Visit       Cardiovascular and Mediastinum   Paroxysmal atrial fibrillation (HCC)   Relevant Medications   atenolol (TENORMIN) 100 MG tablet   apixaban (ELIQUIS) 5 MG TABS tablet   irbesartan-hydrochlorothiazide (AVALIDE) 300-12.5 MG tablet   Hypertension goal BP (blood pressure) < 140/90    Well controlled. Continue current regimen. Follow up in  6 mo       Relevant Medications   atenolol (TENORMIN) 100 MG tablet   apixaban (ELIQUIS) 5 MG TABS tablet   irbesartan-hydrochlorothiazide  (AVALIDE) 300-12.5 MG tablet     Endocrine   Controlled diabetes mellitus type 2 with complications (HCC) - Primary    A1C looks great at 6.5 today.  Much improved form 8.8.  She had done a great job.  I think going back to cardiac rehab has been helpful as well and just really encouraged her to keep up the exercise especially once they discharged her.  We discussed increasing the Ozempic to 1 mg monitor for any increase in nausea.  She is tolerated the medication well so far without any significant problems my goal is to eventually work her up to 2 mg and then get her off of the metformin which would be fantastic.      Relevant Medications   Semaglutide, 1 MG/DOSE, 4 MG/3ML SOPN   irbesartan-hydrochlorothiazide (AVALIDE) 300-12.5 MG tablet   Other Relevant Orders   POCT glycosylated hemoglobin (Hb A1C) (Completed)   Acquired hypothyroidism   Relevant Medications   atenolol (TENORMIN) 100 MG tablet   levothyroxine (SYNTHROID) 112 MCG tablet     Other   BMI 33.0-33.9,adult    She is really done a great job.  By 1 of continue to work on weight loss.  So organ to try pushing the Ozempic a little bit higher to 1 mg and see if this is helpful in curbing appetite continue to work on good food choices and increasing activity level.      Other Visit Diagnoses     Need for immunization against influenza       Relevant Orders   Flu Vaccine QUAD High Dose(Fluad) (Completed)   Hypertension associated with diabetes (HCC)       Relevant Medications   Semaglutide, 1 MG/DOSE, 4 MG/3ML SOPN   atenolol (TENORMIN) 100 MG tablet   apixaban (ELIQUIS) 5 MG TABS tablet   irbesartan-hydrochlorothiazide (AVALIDE) 300-12.5 MG tablet       Meds ordered this encounter  Medications   Semaglutide, 1 MG/DOSE, 4 MG/3ML SOPN    Sig: Inject 1 mg as directed once a week.    Dispense:  3 mL    Refill:  3    Please d/c remaining refills on the 0.5. Thank you   atenolol (TENORMIN) 100 MG tablet    Sig: Take  1 tablet (100 mg total) by mouth daily.    Dispense:  90 tablet    Refill:  1   apixaban (ELIQUIS) 5 MG TABS tablet    Sig: Take 1 tablet (5 mg total) by mouth 2 (two) times daily.    Dispense:  180 tablet    Refill:  1   irbesartan-hydrochlorothiazide (AVALIDE) 300-12.5 MG tablet    Sig: Take 0.5 tablets by mouth daily.    Dispense:  45 tablet    Refill:  1   levothyroxine (SYNTHROID) 112 MCG tablet    Sig: Take  1 tablet (112 mcg total) by mouth daily before breakfast.    Dispense:  90 tablet    Refill:  1     Follow-up: Return in about 3 months (around 12/07/2021) for Diabetes follow-up.    Beatrice Lecher, MD

## 2021-09-06 NOTE — Assessment & Plan Note (Signed)
She is really done a great job.  By 1 of continue to work on weight loss.  So organ to try pushing the Ozempic a little bit higher to 1 mg and see if this is helpful in curbing appetite continue to work on good food choices and increasing activity level.

## 2021-09-06 NOTE — Assessment & Plan Note (Signed)
A1C looks great at 6.5 today.  Much improved form 8.8.  She had done a great job.  I think going back to cardiac rehab has been helpful as well and just really encouraged her to keep up the exercise especially once they discharged her.  We discussed increasing the Ozempic to 1 mg monitor for any increase in nausea.  She is tolerated the medication well so far without any significant problems my goal is to eventually work her up to 2 mg and then get her off of the metformin which would be fantastic.

## 2021-09-08 ENCOUNTER — Other Ambulatory Visit: Payer: Self-pay | Admitting: Family Medicine

## 2021-09-08 ENCOUNTER — Encounter (HOSPITAL_COMMUNITY): Payer: PPO

## 2021-09-08 DIAGNOSIS — M1A0790 Idiopathic chronic gout, unspecified ankle and foot, without tophus (tophi): Secondary | ICD-10-CM

## 2021-09-11 ENCOUNTER — Other Ambulatory Visit: Payer: Self-pay

## 2021-09-11 ENCOUNTER — Encounter (HOSPITAL_COMMUNITY)
Admission: RE | Admit: 2021-09-11 | Discharge: 2021-09-11 | Disposition: A | Discharge status: Home / Self Care | Payer: PPO | Source: Ambulatory Visit | Attending: Cardiology | Admitting: Cardiology

## 2021-09-11 DIAGNOSIS — Z5189 Encounter for other specified aftercare: Secondary | ICD-10-CM | POA: Diagnosis not present

## 2021-09-11 DIAGNOSIS — I214 Non-ST elevation (NSTEMI) myocardial infarction: Secondary | ICD-10-CM

## 2021-09-12 NOTE — Progress Notes (Signed)
Cardiac Individual Treatment Plan  Patient Details  Name: Tara Campbell MRN: [id number] Date of Birth: Apr 17, 1946 Referring Provider:   Flowsheet Row CARDIAC REHAB PHASE II ORIENTATION from 07/27/2021 in [place]  Referring Provider Kirk Ruths, MD       Initial Encounter Date:  [place] PHASE II ORIENTATION from 07/27/2021 in [facility]  Date 07/27/21       Visit Diagnosis: 04/25/21 NSTEMI Medical Treatment  Patient's Home Medications on Admission:  Current Outpatient Medications:    albuterol (VENTOLIN HFA) 108 (90 Base) MCG/ACT inhaler, Inhale 2 puffs into the lungs every 6 (six) hours as needed for wheezing., Disp: 18 g, Rfl: prn   allopurinol (ZYLOPRIM) 300 MG tablet, Take 1 tablet (300 mg total) by mouth 2 (two) times daily., Disp: 180 tablet, Rfl: 1   apixaban (ELIQUIS) 5 MG TABS tablet, Take 1 tablet (5 mg total) by mouth 2 (two) times daily., Disp: 180 tablet, Rfl: 1   atenolol (TENORMIN) 100 MG tablet, Take 1 tablet (100 mg total) by mouth daily., Disp: 90 tablet, Rfl: 1   atorvastatin (LIPITOR) 80 MG tablet, Take 1 tablet (80 mg total) by mouth at bedtime., Disp: 90 tablet, Rfl: 3   blood glucose meter kit and supplies KIT, Check morning fasting blood glucose daily and up to 4 times daily as directed, Disp: 1 each, Rfl: 0   Cyanocobalamin (B-12 PO), Take 1,000 mcg by mouth daily., Disp: , Rfl:    EPINEPHrine 0.3 mg/0.3 mL IJ SOAJ injection, Inject 0.3 mg into the muscle as needed for anaphylaxis. Call 9-1-1 after use., Disp: 1 each, Rfl: 2   Evolocumab with Infusor ([place]) 420 MG/3.5ML SOCT, Inject 420 mg into the skin every 30 (thirty) days., Disp: 3.6 mL, Rfl: 11   ezetimibe (ZETIA) 10 MG tablet, TAKE ONE TABLET BY MOUTH EVERY DAY (Patient taking differently: Take 10 mg by mouth daily.), Disp: 90 tablet, Rfl: 0   fluticasone (FLONASE) 50 MCG/ACT nasal spray, Place  1 spray into both nostrils daily., Disp: 16 g, Rfl: 3   fluticasone furoate-vilanterol (BREO ELLIPTA) 200-25 MCG/INH AEPB, Inhale 1 puff into the lungs daily. Needs appt for further refills, Disp: 60 each, Rfl: 6   glucose blood test strip, To be used twice daily for testing blood sugars. E11.65, Disp: 200 each, Rfl: 11   ipratropium-albuterol (DUONEB) 0.5-2.5 (3) MG/3ML SOLN, Take 3 mLs by nebulization every 6 (six) hours as needed. (Patient taking differently: Take 3 mLs by nebulization every 6 (six) hours as needed (shortness of breath).), Disp: 3 mL, Rfl: PRN   irbesartan-hydrochlorothiazide (AVALIDE) 300-12.5 MG tablet, Take 0.5 tablets by mouth daily., Disp: 45 tablet, Rfl: 1   isosorbide mononitrate (IMDUR) 60 MG 24 hr tablet, Take 1 tablet (60 mg total) by mouth daily., Disp: 90 tablet, Rfl: 1   levalbuterol (XOPENEX HFA) 45 MCG/ACT inhaler, Inhale 1-2 puffs into the lungs every 6 (six) hours as needed for wheezing or shortness of breath., Disp: , Rfl:    levothyroxine (SYNTHROID) 112 MCG tablet, Take 1 tablet (112 mcg total) by mouth daily before breakfast., Disp: 90 tablet, Rfl: 1   Mepolizumab ([name]) 100 MG/ML SOAJ, Inject 1 mL (100 mg total) into the skin every 28 (twenty-eight) days., Disp: 1 mL, Rfl: 5   metFORMIN (GLUCOPHAGE) 500 MG tablet, Take 2 tablets (1,000 mg total) by mouth 2 (two) times daily with a meal., Disp: 360 tablet, Rfl: 1  nitroGLYCERIN (NITROSTAT) 0.4 MG SL tablet, Place 1 tablet (0.4 mg total) under the tongue every 5 (five) minutes as needed for chest pain (or tightness)., Disp: 30 tablet, Rfl: prn   Omega-3 Fatty Acids (FISH OIL PO), Take 1,000 Units by mouth daily., Disp: , Rfl:    Semaglutide, 1 MG/DOSE, 4 MG/3ML SOPN, Inject 1 mg as directed once a week., Disp: 3 mL, Rfl: 3   SPIRIVA RESPIMAT 2.5 MCG/ACT AERS, Inhale 2 puffs into the lungs daily., Disp: 4 g, Rfl: 5  Past Medical History: Past Medical History:  Diagnosis Date   Allergy    Arthritis     Asthma    Colon polyps    Coronary artery disease    Diabetes mellitus without complication ([place])    Gout    Heart attack ([place]) 07/30/2016   PCI, 2 stents   Hyperlipidemia    Hypertension    Internal hemorrhoids    Left rotator cuff tear    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   Otomycosis of right ear 09/27/2017   Paroxysmal atrial fibrillation ([facility])    Thyroid disease     Tobacco Use: Social History   Tobacco Use  Smoking Status Never  Smokeless Tobacco Never    Labs: Recent Review Flowsheet Data     Labs for ITP Cardiac and Pulmonary Rehab Latest Ref Rng & Units 03/22/2021 03/24/2021 04/26/2021 09/04/2021 09/06/2021   Cholestrol <200 mg/dL 266(H) - 240(H) 140 -   LDLCALC mg/dL (calc) 165(H) - 143(H) 68 -   HDL > OR = 50 mg/dL 56 - 47 44(L) -   Trlycerides <150 mg/dL 273(H) - 248(H) 222(H) -   Hemoglobin A1c 4.0 - 5.6 % - 8.7(A) 8.8(H) - 6.5(A)       Capillary Blood Glucose: Lab Results  Component Value Date   GLUCAP 87 08/14/2021   GLUCAP 156 (H) 08/14/2021   GLUCAP 126 (H) 07/31/2021   GLUCAP 169 (H) 07/31/2021   GLUCAP 109 (H) 04/27/2021     Exercise Target Goals: Exercise Program Goal: Individual exercise prescription set using results from initial 6 min walk test and THRR while considering  patient's activity barriers and safety.   Exercise Prescription Goal: Initial exercise prescription builds to 30-45 minutes a day of aerobic activity, 2-3 days per week.  Home exercise guidelines will be given to patient during program as part of exercise prescription that the participant will acknowledge.  Activity Barriers & Risk Stratification:  Activity Barriers & Cardiac Risk Stratification - 07/27/21 1449       Activity Barriers & Cardiac Risk Stratification   Activity Barriers Arthritis;Joint Problems;Deconditioning;Shortness of Breath;Balance Concerns    Cardiac Risk Stratification High             6 Minute Walk:  6 Minute Walk     Row Name 07/27/21 1355          6 Minute Walk   Phase Initial     Distance 1249 feet     Walk Time 6 minutes     # of Rest Breaks 0     MPH 2.37     METS 2.52     RPE 13     Perceived Dyspnea  2     VO2 Peak 8.83     Symptoms Yes (comment)     Comments Pain: Right hip chronic, 4/10. SOB, RPD = 2     Resting HR 88 bpm     Resting BP 130/60     Resting Oxygen Saturation  98 %     Exercise Oxygen Saturation  during 6 min walk 97 %     Max Ex. HR 113 bpm     Max Ex. BP 176/78     2 Minute Post BP 150/68  Re-check 118/70              Oxygen Initial Assessment:   Oxygen Re-Evaluation:   Oxygen Discharge (Final Oxygen Re-Evaluation):   Initial Exercise Prescription:  Initial Exercise Prescription - 07/27/21 1400       Date of Initial Exercise RX and Referring Provider   Date 07/27/21    Referring Provider Kirk Ruths, MD    Expected Discharge Date 09/22/21      NuStep   Level 2    SPM 75    Minutes 30    METs 2      Prescription Details   Frequency (times per week) 3    Duration Progress to 30 minutes of continuous aerobic without signs/symptoms of physical distress      Intensity   THRR 40-80% of Max Heartrate 57-114    Ratings of Perceived Exertion 11-13    Perceived Dyspnea 0-4      Progression   Progression Continue progressive overload as per policy without signs/symptoms or physical distress.      Resistance Training   Training Prescription Yes    Weight 3 lbs    Reps 10-15             Perform Capillary Blood Glucose checks as needed.  Exercise Prescription Changes:   Exercise Prescription Changes     Row Name 07/31/21 1505 08/14/21 1500 08/30/21 1500 09/11/21 1500       Response to Exercise   Blood Pressure (Admit) 120/54 122/60 120/60 118/64    Blood Pressure (Exercise) 130/70 118/60 144/74 130/58    Blood Pressure (Exit) 102/62 110/60 120/60 108/62    Heart Rate (Admit) 84 bpm 79 bpm 81 bpm 76 bpm    Heart Rate (Exercise) 88 bpm 92 bpm 99 bpm 98  bpm    Heart Rate (Exit) 75 bpm 79 bpm 77 bpm 83 bpm    Oxygen Saturation (Exercise) -- -- 95 % --    Rating of Perceived Exertion (Exercise) _0 Symptoms None None SOB, RPD = 2 while walking SOB during walk    Comments Pt's first day in the CRP2 program. Reviewed METs and home exercise Rx Reviewed METs and Goals Reviewed METs    Duration Progress to 30 minutes of  aerobic without signs/symptoms of physical distress Continue with 30 min of aerobic exercise without signs/symptoms of physical distress. Continue with 30 min of aerobic exercise without signs/symptoms of physical distress. Continue with 30 min of aerobic exercise without signs/symptoms of physical distress.    Intensity THRR unchanged THRR unchanged THRR unchanged THRR unchanged         Progression   Progression Continue to progress workloads to maintain intensity without signs/symptoms of physical distress. Continue to progress workloads to maintain intensity without signs/symptoms of physical distress. Continue to progress workloads to maintain intensity without signs/symptoms of physical distress. Continue to progress workloads to maintain intensity without signs/symptoms of physical distress.    Average METs 1.6 1.7 2.5 2.7         Resistance Training   Training Prescription Yes Yes No Yes    Weight 3 lbs 3 lbs No weights on Wednesdays 3 lbs    Reps 10-15 10-15 -- 10-15  Time 10 Minutes 10 Minutes -- 10 Minutes         Interval Training   Interval Training No No No No         NuStep   Level _0 SPM 75 75 100 100    Minutes _1 METs 1.6 1.7 2.4 2.6         Track   Laps -- -- 9 15    Minutes -- -- 15 15    METs -- -- 2.04 2.74         Home Exercise Plan   Plans to continue exercise at -- Home (comment) Home (comment) Home (comment)    Frequency -- Add 2 additional days to program exercise sessions. Add 2 additional days to program exercise sessions. Add 2 additional days to program  exercise sessions.    Initial Home Exercises Provided -- 08/14/21 08/14/21 08/14/21             Exercise Comments:   Exercise Comments     Row Name 07/31/21 1505 08/14/21 1559 08/30/21 1613 09/11/21 1602     Exercise Comments Pt's first day in the CRP2 program. Pt had no complaints with todays session. Pt retuned today after having CVOID-19 infection. Today is session 2 but patient wants to begin doing some addtional exercise at home. Reviewed home exercise Rx, Pt verbalized understanding of the Rx and was provided a copy. Reviewed METs and Goals. In discussion with patient, still having SOB with walking. Revised exercise Rx to include 15 minutes of walking. Pt walked for 15 minutes with 2 breaks due to SOB (RPD = 2, SaO2 = 95%). Pt voices feeling positive about her exercise here in the CRP2 program. Reviewed METs. Pt is making good progress. She has increased to 15 laps today on the track. She still gets some SOB but is tolerating the track mush better. Pt also progressing  MET level on nustep.             Exercise Goals and Review:   Exercise Goals     Row Name 07/27/21 1449             Exercise Goals   Increase Physical Activity Yes       Intervention Provide advice, education, support and counseling about physical activity/exercise needs.;Develop an individualized exercise prescription for aerobic and resistive training based on initial evaluation findings, risk stratification, comorbidities and participant's personal goals.       Expected Outcomes Long Term: Add in home exercise to make exercise part of routine and to increase amount of physical activity.;Long Term: Exercising regularly at least 3-5 days a week.;Short Term: Attend rehab on a regular basis to increase amount of physical activity.       Increase Strength and Stamina Yes       Intervention Provide advice, education, support and counseling about physical activity/exercise needs.;Develop an individualized  exercise prescription for aerobic and resistive training based on initial evaluation findings, risk stratification, comorbidities and participant's personal goals.       Expected Outcomes Long Term: Improve cardiorespiratory fitness, muscular endurance and strength as measured by increased METs and functional capacity (6MWT);Short Term: Perform resistance training exercises routinely during rehab and add in resistance training at home;Short Term: Increase workloads from initial exercise prescription for resistance, speed, and METs.       Able to understand and use rate of perceived exertion (RPE) scale Yes  Intervention Provide education and explanation on how to use RPE scale       Expected Outcomes Short Term: Able to use RPE daily in rehab to express subjective intensity level;Long Term:  Able to use RPE to guide intensity level when exercising independently       Knowledge and understanding of Target Heart Rate Range (THRR) Yes       Intervention Provide education and explanation of THRR including how the numbers were predicted and where they are located for reference       Expected Outcomes Short Term: Able to state/look up THRR;Short Term: Able to use daily as guideline for intensity in rehab;Long Term: Able to use THRR to govern intensity when exercising independently       Understanding of Exercise Prescription Yes       Intervention Provide education, explanation, and written materials on patient's individual exercise prescription       Expected Outcomes Short Term: Able to explain program exercise prescription;Long Term: Able to explain home exercise prescription to exercise independently                Exercise Goals Re-Evaluation :  Exercise Goals Re-Evaluation     Row Name 07/31/21 1505 08/14/21 1557 08/30/21 1604         Exercise Goal Re-Evaluation   Exercise Goals Review Increase Physical Activity;Increase Strength and Stamina;Able to understand and use rate of perceived  exertion (RPE) scale;Knowledge and understanding of Target Heart Rate Range (THRR);Understanding of Exercise Prescription Increase Physical Activity;Increase Strength and Stamina;Able to understand and use rate of perceived exertion (RPE) scale;Knowledge and understanding of Target Heart Rate Range (THRR);Able to check pulse independently;Understanding of Exercise Prescription Increase Physical Activity;Increase Strength and Stamina;Able to understand and use rate of perceived exertion (RPE) scale;Knowledge and understanding of Target Heart Rate Range (THRR);Able to check pulse independently;Understanding of Exercise Prescription     Comments Pt' first day in the CRP2 program. Pt understands the exercise Rx, THRR, and RPE sclae. Reviewed METs and home exercise Rx. Pt plans to walk 2x/week and uild up time to 30 minutes. Reviewed MEts and goals. Pt has had goal of weight loss but has not achieved any significant weight loss. Pt also has goal to increase strength and stamina and voices making some progress on this goal. Voices still has difficulty getting SOB with walking at home.     Expected Outcomes Will continue to monitor patient and progress exercise workloads as tolerated. Pt will exercise at home on her own by walking. Will continue to monitor pt progress and adjust exercise workloads as tolerated.              Discharge Exercise Prescription (Final Exercise Prescription Changes):  Exercise Prescription Changes - 09/11/21 1500       Response to Exercise   Blood Pressure (Admit) 118/64    Blood Pressure (Exercise) 130/58    Blood Pressure (Exit) 108/62    Heart Rate (Admit) 76 bpm    Heart Rate (Exercise) 98 bpm    Heart Rate (Exit) 83 bpm    Rating of Perceived Exertion (Exercise) 13    Symptoms SOB during walk    Comments Reviewed METs    Duration Continue with 30 min of aerobic exercise without signs/symptoms of physical distress.    Intensity THRR unchanged      Progression    Progression Continue to progress workloads to maintain intensity without signs/symptoms of physical distress.    Average METs 2.7  Resistance Training   Training Prescription Yes    Weight 3 lbs    Reps 10-15    Time 10 Minutes      Interval Training   Interval Training No      NuStep   Level 3    SPM 100    Minutes 15    METs 2.6      Track   Laps 15    Minutes 15    METs 2.74      Home Exercise Plan   Plans to continue exercise at Home (comment)    Frequency Add 2 additional days to program exercise sessions.    Initial Home Exercises Provided 08/14/21             Nutrition:  Target Goals: Understanding of nutrition guidelines, daily intake of sodium <1527m, cholesterol <204m calories 30% from fat and 7% or less from saturated fats, daily to have 5 or more servings of fruits and vegetables.  Biometrics:  Pre Biometrics - 07/27/21 1310       Pre Biometrics   Waist Circumference 45.5 inches    Hip Circumference 46.75 inches    Waist to Hip Ratio 0.97 %    Triceps Skinfold 32 mm    % Body Fat 47.8 %    Grip Strength 16 kg    Flexibility 13.5 in    Single Leg Stand 3.25 seconds              Nutrition Therapy Plan and Nutrition Goals:  Nutrition Therapy & Goals - 08/21/21 1536       Nutrition Therapy   Diet TLC    Drug/Food Interactions Statins/Certain Fruits      Personal Nutrition Goals   Nutrition Goal Pt to identify food quantities necessary to achieve weight loss of 6-24 lb at graduation from cardiac rehab.    Personal Goal #2 Pt to build a healthy plate including vegetables, fruits, whole grains, and low-fat dairy products in a heart healthy meal plan.      Intervention Plan   Intervention Prescribe, educate and counsel regarding individualized specific dietary modifications aiming towards targeted core components such as weight, hypertension, lipid management, diabetes, heart failure and other comorbidities.;Nutrition handout(s) given  to patient.    Expected Outcomes Short Term Goal: Understand basic principles of dietary content, such as calories, fat, sodium, cholesterol and nutrients.;Long Term Goal: Adherence to prescribed nutrition plan.             Nutrition Assessments:  MEDIFICTS Score Key: ?70 Need to make dietary changes  40-70 Heart Healthy Diet ? 40 Therapeutic Level Cholesterol Diet   Flowsheet Row CARDIAC REHAB PHASE II [facility]ERC[facility] Total Score on Ad[facility] 77      Picture Your Plate Scores: <4<25nhealthy dietary pattern with much room for improvement. 41-50 Dietary pattern unlikely to meet recommendations for good health and room for improvement. 51-60 More healthful dietary pattern, with some room for improvement.  >60 Healthy dietary pattern, although there may be some specific behaviors that could be improved.    Nutrition Goals Re-Evaluation:  Nutrition Goals Re-Evaluation     [place] 08/21/21 1537             Goals   Current Weight 187 lb (84.8 kg)       Nutrition Goal Pt to identify food quantities necessary to achieve weight loss of 6-24 lb at graduation from cardiac rehab.  Personal Goal #2 Re-Evaluation   Personal Goal #2 Pt to build a healthy plate including vegetables, fruits, whole grains, and low-fat dairy products in a heart healthy meal plan.                Nutrition Goals Re-Evaluation:  Nutrition Goals Re-Evaluation     [place] Name 08/21/21 1537             Goals   Current Weight 187 lb (84.8 kg)       Nutrition Goal Pt to identify food quantities necessary to achieve weight loss of 6-24 lb at graduation from cardiac rehab.               Personal Goal #2 Re-Evaluation   Personal Goal #2 Pt to build a healthy plate including vegetables, fruits, whole grains, and low-fat dairy products in a heart healthy meal plan.                Nutrition Goals Discharge  (Final Nutrition Goals Re-Evaluation):  Nutrition Goals Re-Evaluation - 08/21/21 1537       Goals   Current Weight 187 lb (84.8 kg)    Nutrition Goal Pt to identify food quantities necessary to achieve weight loss of 6-24 lb at graduation from cardiac rehab.      Personal Goal #2 Re-Evaluation   Personal Goal #2 Pt to build a healthy plate including vegetables, fruits, whole grains, and low-fat dairy products in a heart healthy meal plan.             Psychosocial: Target Goals: Acknowledge presence or absence of significant depression and/or stress, maximize coping skills, provide positive support system. Participant is able to verbalize types and ability to use techniques and skills needed for reducing stress and depression.  Initial Review & Psychosocial Screening:  Initial Psych Review & Screening - 07/27/21 1346       Initial Review   Current issues with None Identified      Family Dynamics   Good Support System? Yes   Tara Campbell lives alone. Tara Campbell has her sister, a newphew and a neighbor for support.     Barriers   Psychosocial barriers to participate in program There are no identifiable barriers or psychosocial needs.      Screening Interventions   Interventions Encouraged to exercise             Quality of Life Scores:  Quality of Life - 07/27/21 1443       Quality of Life   Select Quality of Life      Quality of Life Scores   Health/Function Pre 24.5 %    Socioeconomic Pre 25.83 %    Psych/Spiritual Pre 27.71 %    Family Pre 27 %    GLOBAL Pre 25.77 %            Scores of 19 and below usually indicate a poorer quality of life in these areas.  A difference of  2-3 points is a clinically meaningful difference.  A difference of 2-3 points in the total score of the Quality of Life Index has been associated with significant improvement in overall quality of life, self-image, physical symptoms, and general health in studies assessing change in quality of  life.  PHQ-9: Recent Review Flowsheet Data     Depression screen [facility] [address] 2/9 07/27/2021 06/23/2021 04/06/2021 03/24/2021 10/05/2019   Decreased Interest 0 - 0 0 0   Down, Depressed, Hopeless 0 2 0 0 0   PHQ -  2 Score 0 2 0 0 0      Interpretation of Total Score  Total Score Depression Severity:  1-4 = Minimal depression, 5-9 = Mild depression, 10-14 = Moderate depression, 15-19 = Moderately severe depression, 20-27 = Severe depression   Psychosocial Evaluation and Intervention:   Psychosocial Re-Evaluation:  Psychosocial Re-Evaluation     [place] Name 08/15/21 1459 09/08/21 1740           Psychosocial Re-Evaluation   Current issues with None Identified None Identified      Comments Tara Campbell returned to exercise at cardiac rehab after being absent with a COVID 19 infection. Tara Campbell did not voice any increased concerns or stressors on 08/15/21. --      Interventions Encouraged to attend Cardiac Rehabilitation for the exercise Encouraged to attend Cardiac Rehabilitation for the exercise      Continue Psychosocial Services  No Follow up required No Follow up required               Psychosocial Discharge (Final Psychosocial Re-Evaluation):  Psychosocial Re-Evaluation - 09/08/21 1740       Psychosocial Re-Evaluation   Current issues with None Identified    Interventions Encouraged to attend Cardiac Rehabilitation for the exercise    Continue Psychosocial Services  No Follow up required             Vocational Rehabilitation: Provide vocational rehab assistance to qualifying candidates.   Vocational Rehab Evaluation & Intervention:  Vocational Rehab - 07/27/21 1347       Initial Vocational Rehab Evaluation & Intervention   Assessment shows need for Vocational Rehabilitation No   Tara Campbell is retired and does not need vocational rehab at this time            Education: Education Goals: Education classes will be provided on a weekly basis, covering required topics. Participant  will state understanding/return demonstration of topics presented.  Learning Barriers/Preferences:  Learning Barriers/Preferences - 07/27/21 1445       Learning Barriers/Preferences   Learning Barriers Sight;Hearing    Learning Preferences Written Material;Computer/Internet             Education Topics: Count Your Pulse:  -Group instruction provided by verbal instruction, demonstration, patient participation and written materials to support subject.  Instructors address importance of being able to find your pulse and how to count your pulse when at home without a heart monitor.  Patients get hands on experience counting their pulse with staff help and individually.   Heart Attack, Angina, and Risk Factor Modification:  -Group instruction provided by verbal instruction, video, and written materials to support subject.  Instructors address signs and symptoms of angina and heart attacks.    Also discuss risk factors for heart disease and how to make changes to improve heart health risk factors.   Functional Fitness:  -Group instruction provided by verbal instruction, demonstration, patient participation, and written materials to support subject.  Instructors address safety measures for doing things around the house.  Discuss how to get up and down off the floor, how to pick things up properly, how to safely get out of a chair without assistance, and balance training.   Meditation and Mindfulness:  -Group instruction provided by verbal instruction, patient participation, and written materials to support subject.  Instructor addresses importance of mindfulness and meditation practice to help reduce stress and improve awareness.  Instructor also leads participants through a meditation exercise.    Stretching for Flexibility and Mobility:  -Group instruction provided by  verbal instruction, patient participation, and written materials to support subject.  Instructors lead participants  through series of stretches that are designed to increase flexibility thus improving mobility.  These stretches are additional exercise for major muscle groups that are typically performed during regular warm up and cool down.   Hands Only CPR:  -Group verbal, video, and participation provides a basic overview of AHA guidelines for community CPR. Role-play of emergencies allow participants the opportunity to practice calling for help and chest compression technique with discussion of AED use.   Hypertension: -Group verbal and written instruction that provides a basic overview of hypertension including the most recent diagnostic guidelines, risk factor reduction with self-care instructions and medication management.    Nutrition I class: Heart Healthy Eating:  -Group instruction provided by PowerPoint slides, verbal discussion, and written materials to support subject matter. The instructor gives an explanation and review of the Therapeutic Lifestyle Changes diet recommendations, which includes a discussion on lipid goals, dietary fat, sodium, fiber, plant stanol/sterol esters, sugar, and the components of a well-balanced, healthy diet.   Nutrition II class: Lifestyle Skills:  -Group instruction provided by PowerPoint slides, verbal discussion, and written materials to support subject matter. The instructor gives an explanation and review of label reading, grocery shopping for heart health, heart healthy recipe modifications, and ways to make healthier choices when eating out.   Diabetes Question & Answer:  -Group instruction provided by PowerPoint slides, verbal discussion, and written materials to support subject matter. The instructor gives an explanation and review of diabetes co-morbidities, pre- and post-prandial blood glucose goals, pre-exercise blood glucose goals, signs, symptoms, and treatment of hypoglycemia and hyperglycemia, and foot care basics.   Diabetes Blitz:  -Group  instruction provided by PowerPoint slides, verbal discussion, and written materials to support subject matter. The instructor gives an explanation and review of the physiology behind type 1 and type 2 diabetes, diabetes medications and rational behind using different medications, pre- and post-prandial blood glucose recommendations and Hemoglobin A1c goals, diabetes diet, and exercise including blood glucose guidelines for exercising safely.    Portion Distortion:  -Group instruction provided by PowerPoint slides, verbal discussion, written materials, and food models to support subject matter. The instructor gives an explanation of serving size versus portion size, changes in portions sizes over the last 20 years, and what consists of a serving from each food group.   Stress Management:  -Group instruction provided by verbal instruction, video, and written materials to support subject matter.  Instructors review role of stress in heart disease and how to cope with stress positively.     Exercising on Your Own:  -Group instruction provided by verbal instruction, power point, and written materials to support subject.  Instructors discuss benefits of exercise, components of exercise, frequency and intensity of exercise, and end points for exercise.  Also discuss use of nitroglycerin and activating EMS.  Review options of places to exercise outside of rehab.  Review guidelines for sex with heart disease.   Cardiac Drugs I:  -Group instruction provided by verbal instruction and written materials to support subject.  Instructor reviews cardiac drug classes: antiplatelets, anticoagulants, beta blockers, and statins.  Instructor discusses reasons, side effects, and lifestyle considerations for each drug class.   Cardiac Drugs II:  -Group instruction provided by verbal instruction and written materials to support subject.  Instructor reviews cardiac drug classes: angiotensin converting enzyme inhibitors  (ACE-I), angiotensin II receptor blockers (ARBs), nitrates, and calcium channel blockers.  Instructor discusses reasons, side  effects, and lifestyle considerations for each drug class.   Anatomy and Physiology of the Circulatory System:  Group verbal and written instruction and models provide basic cardiac anatomy and physiology, with the coronary electrical and arterial systems. Review of: AMI, Angina, Valve disease, Heart Failure, Peripheral Artery Disease, Cardiac Arrhythmia, Pacemakers, and the ICD.   Other Education:  -Group or individual verbal, written, or video instructions that support the educational goals of the cardiac rehab program.   Holiday Eating Survival Tips:  -Group instruction provided by PowerPoint slides, verbal discussion, and written materials to support subject matter. The instructor gives patients tips, tricks, and techniques to help them not only survive but enjoy the holidays despite the onslaught of food that accompanies the holidays.   Knowledge Questionnaire Score:  Knowledge Questionnaire Score - 07/27/21 1444       Knowledge Questionnaire Score   Pre Score 20/24             Core Components/Risk Factors/Patient Goals at Admission:  Personal Goals and Risk Factors at Admission - 07/27/21 1444       Core Components/Risk Factors/Patient Goals on Admission    Weight Management Yes;Obesity;Weight Loss    Intervention Weight Management: Develop a combined nutrition and exercise program designed to reach desired caloric intake, while maintaining appropriate intake of nutrient and fiber, sodium and fats, and appropriate energy expenditure required for the weight goal.;Weight Management: Provide education and appropriate resources to help participant work on and attain dietary goals.;Weight Management/Obesity: Establish reasonable short term and long term weight goals.;Obesity: Provide education and appropriate resources to help participant work on and attain  dietary goals.    Admit Weight 186 lb 11.7 oz (84.7 kg)    Expected Outcomes Short Term: Continue to assess and modify interventions until short term weight is achieved;Long Term: Adherence to nutrition and physical activity/exercise program aimed toward attainment of established weight goal;Weight Maintenance: Understanding of the daily nutrition guidelines, which includes 25-35% calories from fat, 7% or less cal from saturated fats, less than 268m cholesterol, less than 1.5gm of sodium, & 5 or more servings of fruits and vegetables daily;Weight Loss: Understanding of general recommendations for a balanced deficit meal plan, which promotes 1-2 lb weight loss per week and includes a negative energy balance of [phone] kcal/d;Understanding recommendations for meals to include 15-35% energy as protein, 25-35% energy from fat, 35-60% energy from carbohydrates, less than 2038mof dietary cholesterol, 20-35 gm of total fiber daily;Understanding of distribution of calorie intake throughout the day with the consumption of 4-5 meals/snacks;Weight Gain: Understanding of general recommendations for a high calorie, high protein meal plan that promotes weight gain by distributing calorie intake throughout the day with the consumption for 4-5 meals, snacks, and/or supplements    Diabetes Yes    Intervention Provide education about signs/symptoms and action to take for hypo/hyperglycemia.;Provide education about proper nutrition, including hydration, and aerobic/resistive exercise prescription along with prescribed medications to achieve blood glucose in normal ranges: Fasting glucose 65-99 mg/dL    Expected Outcomes Short Term: Participant verbalizes understanding of the signs/symptoms and immediate care of hyper/hypoglycemia, proper foot care and importance of medication, aerobic/resistive exercise and nutrition plan for blood glucose control.;Long Term: Attainment of HbA1C < 7%.    Hypertension Yes    Intervention  Provide education on lifestyle modifcations including regular physical activity/exercise, weight management, moderate sodium restriction and increased consumption of fresh fruit, vegetables, and low fat dairy, alcohol moderation, and smoking cessation.;Monitor prescription use compliance.    Expected Outcomes  Short Term: Continued assessment and intervention until BP is < 140/16m HG in hypertensive participants. < 130/88mHG in hypertensive participants with diabetes, heart failure or chronic kidney disease.;Long Term: Maintenance of blood pressure at goal levels.    Lipids Yes    Intervention Provide education and support for participant on nutrition & aerobic/resistive exercise along with prescribed medications to achieve LDL <7032mHDL >12m67m  Expected Outcomes Short Term: Participant states understanding of desired cholesterol values and is compliant with medications prescribed. Participant is following exercise prescription and nutrition guidelines.;Long Term: Cholesterol controlled with medications as prescribed, with individualized exercise RX and with personalized nutrition plan. Value goals: LDL < 70mg43mL > 40 mg.    Stress Yes    Intervention Offer individual and/or small group education and counseling on adjustment to heart disease, stress management and health-related lifestyle change. Teach and support self-help strategies.;Refer participants experiencing significant psychosocial distress to appropriate mental health specialists for further evaluation and treatment. When possible, include family members and significant others in education/counseling sessions.    Expected Outcomes Short Term: Participant demonstrates changes in health-related behavior, relaxation and other stress management skills, ability to obtain effective social support, and compliance with psychotropic medications if prescribed.;Long Term: Emotional wellbeing is indicated by absence of clinically significant  psychosocial distress or social isolation.             Core Components/Risk Factors/Patient Goals Review:   Goals and Risk Factor Review     Row Name 08/15/21 1502 09/08/21 1740           Core Components/Risk Factors/Patient Goals Review   Personal Goals Review Weight Management/Obesity;Hypertension;Lipids;Diabetes;Stress Weight Management/Obesity;Hypertension;Lipids;Diabetes;Stress      Review SandyLovey Newcomerrned to exercise after being absent with COVID 19. SandyLovey Newcomerwell for her second day of exercise on 08/14/21. Vital signs and CBG's were stable. SandyLovey Newcomergood attendance and participation in phase 2 cardiac rehab. Tara Campbell rpeorts feeling stronger since starting the program.      Expected Outcomes SandyLovey Campbell continue to participate in phase 2 cardiac rehab for exercise, nutrition and lifestyle modifications. SandyLovey Campbell continue to participate in phase 2 cardiac rehab for exercise, nutrition and lifestyle modifications.               Core Components/Risk Factors/Patient Goals at Discharge (Final Review):   Goals and Risk Factor Review - 09/08/21 1740       Core Components/Risk Factors/Patient Goals Review   Personal Goals Review Weight Management/Obesity;Hypertension;Lipids;Diabetes;Stress    Review SandyLovey Newcomergood attendance and participation in phase 2 cardiac rehab. Tara Campbell rpeorts feeling stronger since starting the program.    Expected Outcomes SandyLovey Campbell continue to participate in phase 2 cardiac rehab for exercise, nutrition and lifestyle modifications.             ITP Comments:  ITP Comments     Row Name 07/27/21 1341 08/15/21 1457 09/08/21 1739       ITP Comments Dr TraciFransico HimMedical Director 30 Day ITP Review. Tara Campbell returned to exercise on 08/14/21 after being out with COVID 19. SandyLovey Newcomerwell on her second day of exercise. 30 Day ITP Review. SandyLovey Newcomergood attendance and participation in phase 2 cardiac rehab.              Comments: See ITP  comments

## 2021-09-13 ENCOUNTER — Other Ambulatory Visit: Payer: Self-pay

## 2021-09-13 ENCOUNTER — Encounter (HOSPITAL_COMMUNITY)
Admission: RE | Admit: 2021-09-13 | Discharge: 2021-09-13 | Disposition: A | Discharge status: Home / Self Care | Payer: PPO | Source: Ambulatory Visit | Attending: Cardiology | Admitting: Cardiology

## 2021-09-13 DIAGNOSIS — Z5189 Encounter for other specified aftercare: Secondary | ICD-10-CM | POA: Diagnosis not present

## 2021-09-13 DIAGNOSIS — I214 Non-ST elevation (NSTEMI) myocardial infarction: Secondary | ICD-10-CM

## 2021-09-15 ENCOUNTER — Other Ambulatory Visit: Payer: Self-pay

## 2021-09-15 ENCOUNTER — Encounter (HOSPITAL_COMMUNITY)
Admission: RE | Admit: 2021-09-15 | Discharge: 2021-09-15 | Disposition: A | Discharge status: Home / Self Care | Payer: PPO | Source: Ambulatory Visit | Attending: Cardiology | Admitting: Cardiology

## 2021-09-15 DIAGNOSIS — Z5189 Encounter for other specified aftercare: Secondary | ICD-10-CM | POA: Diagnosis not present

## 2021-09-15 DIAGNOSIS — I214 Non-ST elevation (NSTEMI) myocardial infarction: Secondary | ICD-10-CM

## 2021-09-18 ENCOUNTER — Encounter (HOSPITAL_COMMUNITY)
Admission: RE | Admit: 2021-09-18 | Discharge: 2021-09-18 | Disposition: A | Discharge status: Home / Self Care | Payer: PPO | Source: Ambulatory Visit | Attending: Cardiology | Admitting: Cardiology

## 2021-09-18 ENCOUNTER — Other Ambulatory Visit: Payer: Self-pay

## 2021-09-18 DIAGNOSIS — Z5189 Encounter for other specified aftercare: Secondary | ICD-10-CM | POA: Diagnosis not present

## 2021-09-18 DIAGNOSIS — I214 Non-ST elevation (NSTEMI) myocardial infarction: Secondary | ICD-10-CM

## 2021-09-20 ENCOUNTER — Ambulatory Visit (INDEPENDENT_AMBULATORY_CARE_PROVIDER_SITE_OTHER): Payer: PPO

## 2021-09-20 ENCOUNTER — Other Ambulatory Visit: Payer: Self-pay

## 2021-09-20 ENCOUNTER — Encounter (HOSPITAL_COMMUNITY)
Admission: RE | Admit: 2021-09-20 | Discharge: 2021-09-20 | Disposition: A | Discharge status: Home / Self Care | Payer: PPO | Source: Ambulatory Visit | Attending: Cardiology | Admitting: Cardiology

## 2021-09-20 VITALS — Ht 62.25 in | Wt 186.7 lb

## 2021-09-20 DIAGNOSIS — Z0389 Encounter for observation for other suspected diseases and conditions ruled out: Secondary | ICD-10-CM | POA: Diagnosis not present

## 2021-09-20 DIAGNOSIS — Z780 Asymptomatic menopausal state: Secondary | ICD-10-CM | POA: Diagnosis not present

## 2021-09-20 DIAGNOSIS — Z5189 Encounter for other specified aftercare: Secondary | ICD-10-CM | POA: Diagnosis not present

## 2021-09-20 DIAGNOSIS — Z0000 Encounter for general adult medical examination without abnormal findings: Secondary | ICD-10-CM

## 2021-09-20 DIAGNOSIS — Z13820 Encounter for screening for osteoporosis: Secondary | ICD-10-CM

## 2021-09-20 DIAGNOSIS — Z1231 Encounter for screening mammogram for malignant neoplasm of breast: Secondary | ICD-10-CM | POA: Diagnosis not present

## 2021-09-20 DIAGNOSIS — I214 Non-ST elevation (NSTEMI) myocardial infarction: Secondary | ICD-10-CM

## 2021-09-21 NOTE — Progress Notes (Signed)
Hi Tara Campbell, bone densities test shows a T score of -0.9.  This is considered normal bone density which is great.  No significant change from 2019.  Continue to work on consistent weightbearing exercise in addition to adequate calcium and vitamin D supplementation.

## 2021-09-22 ENCOUNTER — Encounter (HOSPITAL_COMMUNITY)
Admission: RE | Admit: 2021-09-22 | Discharge: 2021-09-22 | Disposition: A | Discharge status: Home / Self Care | Payer: PPO | Source: Ambulatory Visit | Attending: Cardiology | Admitting: Cardiology

## 2021-09-22 ENCOUNTER — Other Ambulatory Visit: Payer: Self-pay

## 2021-09-22 DIAGNOSIS — I214 Non-ST elevation (NSTEMI) myocardial infarction: Secondary | ICD-10-CM

## 2021-09-22 DIAGNOSIS — Z5189 Encounter for other specified aftercare: Secondary | ICD-10-CM | POA: Diagnosis not present

## 2021-09-22 NOTE — Progress Notes (Signed)
Discharge Progress Report  Patient Details  Name: Tara Campbell MRN: [id number] Date of Birth: 13-Jun-1946 Referring Provider:   Flowsheet Row CARDIAC REHAB PHASE II ORIENTATION from 07/27/2021 in [place]  Referring Provider Kirk Ruths, MD        Number of Visits: 17  Reason for Discharge:  Patient reached a stable level of exercise. Patient independent in their exercise. Patient has met program and personal goals.  Smoking History:  Social History   Tobacco Use  Smoking Status Never  Smokeless Tobacco Never    Diagnosis:  04/25/21 NSTEMI Medical Treatment  ADL UCSD:   Initial Exercise Prescription:  Initial Exercise Prescription - 07/27/21 1400       Date of Initial Exercise RX and Referring Provider   Date 07/27/21    Referring Provider Kirk Ruths, MD    Expected Discharge Date 09/22/21      NuStep   Level 2    SPM 75    Minutes 30    METs 2      Prescription Details   Frequency (times per week) 3    Duration Progress to 30 minutes of continuous aerobic without signs/symptoms of physical distress      Intensity   THRR 40-80% of Max Heartrate 57-114    Ratings of Perceived Exertion 11-13    Perceived Dyspnea 0-4      Progression   Progression Continue progressive overload as per policy without signs/symptoms or physical distress.      Resistance Training   Training Prescription Yes    Weight 3 lbs    Reps 10-15             Discharge Exercise Prescription (Final Exercise Prescription Changes):  Exercise Prescription Changes - 09/22/21 1600       Response to Exercise   Blood Pressure (Admit) 126/60    Blood Pressure (Exercise) 152/72    Blood Pressure (Exit) 142/70    Heart Rate (Admit) 94 bpm    Heart Rate (Exercise) 116 bpm    Heart Rate (Exit) 98 bpm    Oxygen Saturation (Exercise) 96 %    Rating of Perceived Exertion (Exercise) 13    Perceived Dyspnea (Exercise) 3    Symptoms SOB during  walk    Comments Pt graduated from the CRP2 program today    Duration Continue with 30 min of aerobic exercise without signs/symptoms of physical distress.    Intensity THRR unchanged      Progression   Progression Continue to progress workloads to maintain intensity without signs/symptoms of physical distress.    Average METs 2.5      Resistance Training   Training Prescription Yes    Weight 3 lbs    Reps 10-15    Time 10 Minutes      Interval Training   Interval Training No      NuStep   Level 3    SPM 100    Minutes 15    METs 2.5      Track   Laps 12    Minutes 15    METs 2.39      Home Exercise Plan   Plans to continue exercise at Home (comment)    Frequency Add 2 additional days to program exercise sessions.    Initial Home Exercises Provided 08/14/21             Functional Capacity:  6 Minute Walk     Row Name 07/27/21 1355 09/20/21  1330       6 Minute Walk   Phase Initial Discharge    Distance 1249 feet 1312 feet    Distance % Change -- 5 %    Distance Feet Change -- 63 ft    Walk Time 6 minutes 6 minutes    # of Rest Breaks 0 0    MPH 2.37 2.5    METS 2.52 2.27    RPE 13 12    Perceived Dyspnea  2 4  Pt voices due to cold weather    VO2 Peak 8.83 7.93    Symptoms Yes (comment) Yes (comment)    Comments Pain: Right hip chronic, 4/10. SOB, RPD = 2 SOB, PRD = 4 (SaO2 = 94%)    Resting HR 88 bpm 80 bpm    Resting BP 130/60 116/60    Resting Oxygen Saturation  98 % 96 %    Exercise Oxygen Saturation  during 6 min walk 97 % 94 %    Max Ex. HR 113 bpm 101 bpm    Max Ex. BP 176/78 148/70    2 Minute Post BP 150/68  Re-check 118/70 --             Psychological, QOL, Others - Outcomes: PHQ 2/9: Depression screen [facility] 2/9 10/05/2021 07/27/2021 06/23/2021 04/06/2021 03/24/2021  Decreased Interest 0 0 - 0 0  Down, Depressed, Hopeless 0 0 2 0 0  PHQ - 2 Score 0 0 2 0 0    Quality of Life:  Quality of Life - 09/15/21 1450       Quality of Life    Select Quality of Life      Quality of Life Scores   Health/Function Pre 24.5 %    Health/Function Post 26.89 %    Health/Function % Change 9.76 %    Socioeconomic Pre 25.83 %    Socioeconomic Post 26.07 %    Socioeconomic % Change  0.93 %    Psych/Spiritual Pre 27.71 %    Psych/Spiritual Post 24.86 %    Psych/Spiritual % Change -10.29 %    Family Pre 27 %    Family Post 25.5 %    Family % Change -5.56 %    GLOBAL Pre 25.77 %    GLOBAL Post 26.09 %    GLOBAL % Change 1.24 %             Personal Goals: Goals established at orientation with interventions provided to work toward goal.  Personal Goals and Risk Factors at Admission - 07/27/21 1444       Core Components/Risk Factors/Patient Goals on Admission    Weight Management Yes;Obesity;Weight Loss    Intervention Weight Management: Develop a combined nutrition and exercise program designed to reach desired caloric intake, while maintaining appropriate intake of nutrient and fiber, sodium and fats, and appropriate energy expenditure required for the weight goal.;Weight Management: Provide education and appropriate resources to help participant work on and attain dietary goals.;Weight Management/Obesity: Establish reasonable short term and long term weight goals.;Obesity: Provide education and appropriate resources to help participant work on and attain dietary goals.    Admit Weight 186 lb 11.7 oz (84.7 kg)    Expected Outcomes Short Term: Continue to assess and modify interventions until short term weight is achieved;Long Term: Adherence to nutrition and physical activity/exercise program aimed toward attainment of established weight goal;Weight Maintenance: Understanding of the daily nutrition guidelines, which includes 25-35% calories from fat, 7% or less cal from saturated fats,  less than 238m cholesterol, less than 1.5gm of sodium, & 5 or more servings of fruits and vegetables daily;Weight Loss: Understanding of general  recommendations for a balanced deficit meal plan, which promotes 1-2 lb weight loss per week and includes a negative energy balance of [phone] kcal/d;Understanding recommendations for meals to include 15-35% energy as protein, 25-35% energy from fat, 35-60% energy from carbohydrates, less than 2078mof dietary cholesterol, 20-35 gm of total fiber daily;Understanding of distribution of calorie intake throughout the day with the consumption of 4-5 meals/snacks;Weight Gain: Understanding of general recommendations for a high calorie, high protein meal plan that promotes weight gain by distributing calorie intake throughout the day with the consumption for 4-5 meals, snacks, and/or supplements    Diabetes Yes    Intervention Provide education about signs/symptoms and action to take for hypo/hyperglycemia.;Provide education about proper nutrition, including hydration, and aerobic/resistive exercise prescription along with prescribed medications to achieve blood glucose in normal ranges: Fasting glucose 65-99 mg/dL    Expected Outcomes Short Term: Participant verbalizes understanding of the signs/symptoms and immediate care of hyper/hypoglycemia, proper foot care and importance of medication, aerobic/resistive exercise and nutrition plan for blood glucose control.;Long Term: Attainment of HbA1C < 7%.    Hypertension Yes    Intervention Provide education on lifestyle modifcations including regular physical activity/exercise, weight management, moderate sodium restriction and increased consumption of fresh fruit, vegetables, and low fat dairy, alcohol moderation, and smoking cessation.;Monitor prescription use compliance.    Expected Outcomes Short Term: Continued assessment and intervention until BP is < 140/9030mG in hypertensive participants. < 130/62m41m in hypertensive participants with diabetes, heart failure or chronic kidney disease.;Long Term: Maintenance of blood pressure at goal levels.    Lipids Yes     Intervention Provide education and support for participant on nutrition & aerobic/resistive exercise along with prescribed medications to achieve LDL <70mg65mL >40mg.72mExpected Outcomes Short Term: Participant states understanding of desired cholesterol values and is compliant with medications prescribed. Participant is following exercise prescription and nutrition guidelines.;Long Term: Cholesterol controlled with medications as prescribed, with individualized exercise RX and with personalized nutrition plan. Value goals: LDL < 70mg, 61m> 40 mg.    Stress Yes    Intervention Offer individual and/or small group education and counseling on adjustment to heart disease, stress management and health-related lifestyle change. Teach and support self-help strategies.;Refer participants experiencing significant psychosocial distress to appropriate mental health specialists for further evaluation and treatment. When possible, include family members and significant others in education/counseling sessions.    Expected Outcomes Short Term: Participant demonstrates changes in health-related behavior, relaxation and other stress management skills, ability to obtain effective social support, and compliance with psychotropic medications if prescribed.;Long Term: Emotional wellbeing is indicated by absence of clinically significant psychosocial distress or social isolation.              Personal Goals Discharge:  Goals and Risk Factor Review     Row Name 08/15/21 1502 09/08/21 1740 10/05/21 1543         Core Components/Risk Factors/Patient Goals Review   Personal Goals Review Weight Management/Obesity;Hypertension;Lipids;Diabetes;Stress Weight Management/Obesity;Hypertension;Lipids;Diabetes;Stress Weight Management/Obesity;Hypertension;Lipids;Diabetes;Stress     Review Tara Campbell to exercise after being absent with COVID 19. Tara Campbell for her second day of exercise on 08/14/21. Vital signs and  CBG's were stable. Tara Campbell attendance and participation in phase 2 cardiac rehab. Tara rpeorts feeling stronger since starting the program. Tara Campbell  attendance and participation in  phase 2 cardiac rehab. Tara rpeorts feeling stronger since starting the program. Tara Campbell completed phase 2 cardiac rehab on 09/22/21.     Expected Outcomes Tara Campbell will continue to participate in phase 2 cardiac rehab for exercise, nutrition and lifestyle modifications. Tara Campbell will continue to participate in phase 2 cardiac rehab for exercise, nutrition and lifestyle modifications. Tara Campbell will continue to exercise,follow  nutrition and lifestyle modifications upon completion of phase 2 cardiac rehab.              Exercise Goals and Review:  Exercise Goals     Row Name 07/27/21 1449             Exercise Goals   Increase Physical Activity Yes       Intervention Provide advice, education, support and counseling about physical activity/exercise needs.;Develop an individualized exercise prescription for aerobic and resistive training based on initial evaluation findings, risk stratification, comorbidities and participant's personal goals.       Expected Outcomes Long Term: Add in home exercise to make exercise part of routine and to increase amount of physical activity.;Long Term: Exercising regularly at least 3-5 days a week.;Short Term: Attend rehab on a regular basis to increase amount of physical activity.       Increase Strength and Stamina Yes       Intervention Provide advice, education, support and counseling about physical activity/exercise needs.;Develop an individualized exercise prescription for aerobic and resistive training based on initial evaluation findings, risk stratification, comorbidities and participant's personal goals.       Expected Outcomes Long Term: Improve cardiorespiratory fitness, muscular endurance and strength as measured by increased METs and functional capacity (6MWT);Short  Term: Perform resistance training exercises routinely during rehab and add in resistance training at home;Short Term: Increase workloads from initial exercise prescription for resistance, speed, and METs.       Able to understand and use rate of perceived exertion (RPE) scale Yes       Intervention Provide education and explanation on how to use RPE scale       Expected Outcomes Short Term: Able to use RPE daily in rehab to express subjective intensity level;Long Term:  Able to use RPE to guide intensity level when exercising independently       Knowledge and understanding of Target Heart Rate Range (THRR) Yes       Intervention Provide education and explanation of THRR including how the numbers were predicted and where they are located for reference       Expected Outcomes Short Term: Able to state/look up THRR;Short Term: Able to use daily as guideline for intensity in rehab;Long Term: Able to use THRR to govern intensity when exercising independently       Understanding of Exercise Prescription Yes       Intervention Provide education, explanation, and written materials on patient's individual exercise prescription       Expected Outcomes Short Term: Able to explain program exercise prescription;Long Term: Able to explain home exercise prescription to exercise independently                Exercise Goals Re-Evaluation:  Exercise Goals Re-Evaluation     Row Name 07/31/21 1505 08/14/21 1557 08/30/21 1604 09/22/21 1615       Exercise Goal Re-Evaluation   Exercise Goals Review Increase Physical Activity;Increase Strength and Stamina;Able to understand and use rate of perceived exertion (RPE) scale;Knowledge and understanding of Target Heart Rate Range (THRR);Understanding of Exercise Prescription Increase Physical Activity;Increase Strength and Stamina;Able  to understand and use rate of perceived exertion (RPE) scale;Knowledge and understanding of Target Heart Rate Range (THRR);Able to check  pulse independently;Understanding of Exercise Prescription Increase Physical Activity;Increase Strength and Stamina;Able to understand and use rate of perceived exertion (RPE) scale;Knowledge and understanding of Target Heart Rate Range (THRR);Able to check pulse independently;Understanding of Exercise Prescription Increase Physical Activity;Increase Strength and Stamina;Able to understand and use rate of perceived exertion (RPE) scale;Knowledge and understanding of Target Heart Rate Range (THRR);Able to check pulse independently;Understanding of Exercise Prescription    Comments Pt' first day in the CRP2 program. Pt understands the exercise Rx, THRR, and RPE sclae. Reviewed METs and home exercise Rx. Pt plans to walk 2x/week and uild up time to 30 minutes. Reviewed MEts and goals. Pt has had goal of weight loss but has not achieved any significant weight loss. Pt also has goal to increase strength and stamina and voices making some progress on this goal. Voices still has difficulty getting SOB with walking at home. Pt graduated from the [place] program. Pt made progress through the program and plans to continue to exercise at the [facility] with her sister. Peak METs during the program were 2.7.    Expected Outcomes Will continue to monitor patient and progress exercise workloads as tolerated. Pt will exercise at home on her own by walking. Will continue to monitor pt progress and adjust exercise workloads as tolerated. Pt will continue to exercise on her own at [facility].             Nutrition & Weight - Outcomes:  Pre Biometrics - 07/27/21 1310       Pre Biometrics   Waist Circumference 45.5 inches    Hip Circumference 46.75 inches    Waist to Hip Ratio 0.97 %    Triceps Skinfold 32 mm    % Body Fat 47.8 %    Grip Strength 16 kg    Flexibility 13.5 in    Single Leg Stand 3.25 seconds             Post Biometrics - 09/20/21 1425        Post  Biometrics   Height 5' 2.25" (1.581 m)    Weight  84.7 kg    Waist Circumference 45.5 inches    Hip Circumference 46.5 inches    Waist to Hip Ratio 0.98 %    BMI (Calculated) 33.89    Triceps Skinfold 31 mm    % Body Fat 47.7 %    Grip Strength 24 kg    Flexibility 13.75 in    Single Leg Stand 15.2 seconds             Nutrition:  Nutrition Therapy & Goals - 08/21/21 1536       Nutrition Therapy   Diet TLC    Drug/Food Interactions Statins/Certain Fruits      Personal Nutrition Goals   Nutrition Goal Pt to identify food quantities necessary to achieve weight loss of 6-24 lb at graduation from cardiac rehab.    Personal Goal #2 Pt to build a healthy plate including vegetables, fruits, whole grains, and low-fat dairy products in a heart healthy meal plan.      Intervention Plan   Intervention Prescribe, educate and counsel regarding individualized specific dietary modifications aiming towards targeted core components such as weight, hypertension, lipid management, diabetes, heart failure and other comorbidities.;Nutrition handout(s) given to patient.    Expected Outcomes Short Term Goal: Understand basic principles of dietary content, such as calories,  fat, sodium, cholesterol and nutrients.;Long Term Goal: Adherence to prescribed nutrition plan.             Nutrition Discharge:   Education Questionnaire Score:  Knowledge Questionnaire Score - 09/15/21 1023       Knowledge Questionnaire Score   Pre Score 22/24             Goals reviewed with patient; copy given to patient.Tara  graduated from cardiac rehab program on 09/22/21 with completion of  17 exercise sessions in Phase II. Pt maintained good attendance and progressed nicely during his participation in rehab as evidenced by increased MET level.   Medication list reconciled. Repeat  PHQ score-0  .  Pt has made significant lifestyle changes and should be commended for her success. Pt feels she has achieved her goals during cardiac rehab.   Pt plans to continue  exercise at the [facility] silver sneakers. Tara Campbell reports feeling stronger and increased her distance on her post exercise walk test by 63 feet. We are proud of Tara's progress! Barnet Pall, RN,BSN 10/05/2021 3:45 PM

## 2021-09-25 NOTE — Progress Notes (Signed)
Please call patient. Normal mammogram.  Repeat in 1 year.  

## 2021-10-02 ENCOUNTER — Ambulatory Visit (INDEPENDENT_AMBULATORY_CARE_PROVIDER_SITE_OTHER): Payer: PPO

## 2021-10-02 ENCOUNTER — Other Ambulatory Visit: Payer: Self-pay

## 2021-10-02 ENCOUNTER — Ambulatory Visit (INDEPENDENT_AMBULATORY_CARE_PROVIDER_SITE_OTHER): Payer: PPO | Admitting: Sports Medicine

## 2021-10-02 DIAGNOSIS — M1811 Unilateral primary osteoarthritis of first carpometacarpal joint, right hand: Secondary | ICD-10-CM

## 2021-10-02 DIAGNOSIS — M19041 Primary osteoarthritis, right hand: Secondary | ICD-10-CM | POA: Diagnosis not present

## 2021-10-02 MED ORDER — ACETAMINOPHEN ER 650 MG PO TBCR: 650.0000 mg | EXTENDED_RELEASE_TABLET | Freq: Three times a day (TID) | ORAL | 3 refills | Status: DC | PRN

## 2021-10-02 NOTE — Assessment & Plan Note (Signed)
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 On NOAC so we cannot use NSAIDs. She will use topical Voltaren 4 times daily, arthritis strength Tylenol 3 times daily, a thumb spica brace, home conditioning exercises, x-rays. Return to see me in 2 to 4 weeks, injection if no better.

## 2021-10-02 NOTE — Progress Notes (Signed)
    Procedures performed today:    None.  Independent interpretation of notes and tests performed by another provider:   None.  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 On NOAC so we cannot use NSAIDs. She will use topical Voltaren 4 times daily, arthritis strength Tylenol 3 times daily, a thumb spica brace, home conditioning exercises, x-rays. Return to see me in 2 to 4 weeks, injection if no better.    ___________________________________________ Ihor Austin. Benjamin Stain, M.D., ABFM., CAQSM. Primary Care and Sports Medicine [facility]  Adjunct Instructor of Family Medicine  [employer]

## 2021-10-06 ENCOUNTER — Other Ambulatory Visit: Payer: Self-pay

## 2021-10-06 ENCOUNTER — Other Ambulatory Visit (HOSPITAL_COMMUNITY): Payer: Self-pay

## 2021-10-06 ENCOUNTER — Encounter: Payer: Self-pay | Admitting: Family Medicine

## 2021-10-06 ENCOUNTER — Encounter (HOSPITAL_COMMUNITY): Payer: Self-pay | Admitting: Cardiology

## 2021-10-06 ENCOUNTER — Ambulatory Visit (HOSPITAL_COMMUNITY)
Admission: RE | Admit: 2021-10-06 | Discharge: 2021-10-06 | Disposition: A | Discharge status: Home / Self Care | Payer: PPO | Source: Ambulatory Visit | Attending: Cardiology | Admitting: Cardiology

## 2021-10-06 VITALS — BP 148/64 | HR 73 | Wt 188.0 lb

## 2021-10-06 DIAGNOSIS — I480 Paroxysmal atrial fibrillation: Secondary | ICD-10-CM | POA: Insufficient documentation

## 2021-10-06 DIAGNOSIS — E119 Type 2 diabetes mellitus without complications: Secondary | ICD-10-CM | POA: Diagnosis not present

## 2021-10-06 DIAGNOSIS — R202 Paresthesia of skin: Secondary | ICD-10-CM | POA: Insufficient documentation

## 2021-10-06 DIAGNOSIS — Z7901 Long term (current) use of anticoagulants: Secondary | ICD-10-CM | POA: Insufficient documentation

## 2021-10-06 DIAGNOSIS — I5022 Chronic systolic (congestive) heart failure: Secondary | ICD-10-CM

## 2021-10-06 DIAGNOSIS — I10 Essential (primary) hypertension: Secondary | ICD-10-CM | POA: Diagnosis not present

## 2021-10-06 DIAGNOSIS — R531 Weakness: Secondary | ICD-10-CM | POA: Diagnosis not present

## 2021-10-06 DIAGNOSIS — I2510 Atherosclerotic heart disease of native coronary artery without angina pectoris: Secondary | ICD-10-CM | POA: Insufficient documentation

## 2021-10-06 DIAGNOSIS — J45909 Unspecified asthma, uncomplicated: Secondary | ICD-10-CM | POA: Insufficient documentation

## 2021-10-06 MED ORDER — EMPAGLIFLOZIN 10 MG PO TABS: 10.0000 mg | ORAL_TABLET | Freq: Every day | ORAL | 11 refills | Status: DC

## 2021-10-06 NOTE — Progress Notes (Signed)
[facility]ED [facility]IN[facility] CONSULT NOTE  Referring Physician: Dr Stanford Breed  Primary Care: Dr Madilyn Fireman  Primary Cardiologist: Dr Stanford Breed   HPI: Ms Wanninger is a 75 year old with a history of CAD, PAF, Asthma, HTN, DMII. Had previous PCI to RCA.   Admitted 04/2021 with chest pain. Had cath that showed severe complex stenosis of her LAD in the proximal mid and distal portion of the vessel.  Patent left circumflex with no significant stenosis, patent left main with no significant stenosis, patent RCA with RCA stents mild nonobstructive disease less than 50% stenosis and normal LVEDP.  Her LAD stenosis was unfavorable for PCI due to heavy calcification and marked tortuosity.  There is also noted to be a small caliber vessel.  Medical therapy was recommended.  Saw Dr Stanford Breed 08/16/21. Stable. Had nose bleeds so plavix was stopped.   Presents with her sister. Overall feeling fine. Complaining of intermittent L ankle weakness and occasional L hand tingling sensation. Started 6 weeks ago and last for 5-10 minutes. Goes away spontaneously. Taking SL Nitro 2 times a week. No difficulty swallowing pills. Denies PND/Orthopnea. Appetite ok. No fever or chills . Seated when taking showers due to ankle weakness.  Taking all medications.   Cardiac Testing  Echo 04/2021 EF 55-60% Echo 08/2020 EF 60-65%   LHC 04/2021 1.  Severe complex stenosis of the LAD in the proximal, mid, and distal vessel. 2.  Patent left circumflex no significant stenosis 3.  Patent left main with no significant stenosis 4.  Patent RCA with patent RCA stents, mild nonobstructive disease present with less than 50% stenosis 5.  Normal LVEDP  Recommendations: The patient has severe diffuse LAD stenoses.  The vessel is unfavorable for PCI with heavy calcification, marked tortuosity, and small vessel caliber.  I would favor medical therapy as an initial approach.  If she fails medical therapy would be reasonable to attempt PCI of the proximal and mid  vessel with technical concerns noted above.  Review of Systems: [y] = yes, '[ ]'  = no   General: Weight gain '[ ]' ; Weight loss '[ ]' ; Anorexia '[ ]' ; Fatigue '[ ]' ; Fever '[ ]' ; Chills '[ ]' ; Weakness '[ ]'   Cardiac: Chest pain/pressure '[ ]' ; Resting SOB [ Y]; Exertional SOB [ Y]; Orthopnea '[ ]' ; Pedal Edema '[ ]' ; Palpitations '[ ]' ; Syncope '[ ]' ; Presyncope '[ ]' ; Paroxysmal nocturnal dyspnea'[ ]'   Pulmonary: Cough '[ ]' ; Wheezing'[ ]' ; Hemoptysis'[ ]' ; Sputum '[ ]' ; Snoring '[ ]'   GI: Vomiting'[ ]' ; Dysphagia'[ ]' ; Melena'[ ]' ; Hematochezia '[ ]' ; Heartburn'[ ]' ; Abdominal pain '[ ]' ; Constipation '[ ]' ; Diarrhea '[ ]' ; BRBPR '[ ]'   GU: Hematuria'[ ]' ; Dysuria '[ ]' ; Nocturia'[ ]'   Vascular: Pain in legs with walking '[ ]' ; Pain in feet with lying flat '[ ]' ; Non-healing sores '[ ]' ; Stroke '[ ]' ; TIA '[ ]' ; Slurred speech '[ ]' ;  Neuro: Headaches'[ ]' ; Vertigo'[ ]' ; Seizures'[ ]' ; Paresthesias'[ ]' ;Blurred vision '[ ]' ; Diplopia '[ ]' ; Vision changes '[ ]'   Ortho/Skin: Arthritis '[ ]' ; Joint pain [ Y]; Muscle pain '[ ]' ; Joint swelling '[ ]' ; Back Pain '[ ]' ; Rash '[ ]'   Psych: Depression'[ ]' ; Anxiety'[ ]'   Heme: Bleeding problems '[ ]' ; Clotting disorders '[ ]' ; Anemia '[ ]'   Endocrine: Diabetes [ y]; Thyroid dysfunction[ y]   Past Medical History:  Diagnosis Date   Allergy    Arthritis    Asthma    Colon polyps    Coronary artery disease    Diabetes mellitus without complication (  [place])    Gout    Heart attack ([place]) 07/30/2016   PCI, 2 stents   Hyperlipidemia    Hypertension    Internal hemorrhoids    Left rotator cuff tear    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 90 tablet 3   albuterol (VENTOLIN HFA) 108 (90 Base) MCG/ACT inhaler Inhale 2 puffs into the lungs every 6 (six) hours as needed for  wheezing. 18 g prn   allopurinol (ZYLOPRIM) 300 MG tablet Take 1 tablet (300 mg total) by mouth 2 (two) times daily. 180 tablet 1   apixaban (ELIQUIS) 5 MG TABS tablet Take 1 tablet (5 mg total) by mouth 2 (two) times daily. 180 tablet 1   atenolol (TENORMIN) 100 MG tablet Take 1 tablet (100 mg total) by mouth daily. 90 tablet 1   atorvastatin (LIPITOR) 80 MG tablet Take 1 tablet (80 mg total) by mouth at bedtime. 90 tablet 3   blood glucose meter kit and supplies KIT Check morning fasting blood glucose daily and up to 4 times daily as directed 1 each 0   Cyanocobalamin (B-12 PO) Take 1,000 mcg by mouth daily.     Cyanocobalamin (VITAMIN B-12 CR PO) Take 1 tablet by mouth daily in the afternoon.     EPINEPHrine 0.3 mg/0.3 mL IJ SOAJ injection Inject 0.3 mg into the muscle as needed for anaphylaxis. Call 9-1-1 after use. 1 each 2   Evolocumab with Infusor ([place]) 420 MG/3.5ML SOCT Inject 420 mg into the skin every 30 (thirty) days. 3.6 mL 11   ezetimibe (ZETIA) 10 MG tablet TAKE ONE TABLET BY MOUTH EVERY DAY 90 tablet 0   fluticasone (FLONASE) 50 MCG/ACT nasal spray Place 1 spray into both nostrils daily. 16 g 3   fluticasone furoate-vilanterol (BREO ELLIPTA) 200-25 MCG/INH AEPB Inhale 1 puff into the lungs daily. Needs appt for further refills 60 each 6   glucose blood test strip To be used twice daily for testing blood sugars. E11.65 200 each 11   ipratropium-albuterol (DUONEB) 0.5-2.5 (3) MG/3ML SOLN Take 3 mLs by nebulization every 6 (six) hours as needed. 3 mL PRN   irbesartan-hydrochlorothiazide (AVALIDE) 300-12.5 MG tablet Take 0.5 tablets by mouth daily. 45 tablet 1   isosorbide mononitrate (IMDUR) 60 MG 24 hr tablet Take 1 tablet (60 mg total) by mouth daily. 90 tablet 1   levalbuterol (XOPENEX HFA) 45 MCG/ACT inhaler Inhale 1-2 puffs into the lungs every 6 (six) hours as needed for wheezing or shortness of breath.     levothyroxine (SYNTHROID) 112 MCG tablet Take 1  tablet (112 mcg total) by mouth daily before breakfast. 90 tablet 1   Mepolizumab ([name]) 100 MG/ML SOAJ Inject 1 mL (100 mg total) into the skin every 28 (twenty-eight) days. 1 mL 5   metFORMIN (GLUCOPHAGE) 500 MG tablet Take 2 tablets (1,000 mg total) by mouth 2 (two) times daily with a meal. 360 tablet 1   nitroGLYCERIN (NITROSTAT) 0.4 MG SL tablet Place 1 tablet (0.4 mg total) under the tongue every 5 (five) minutes as needed for chest pain (or tightness). 30 tablet prn   Omega-3 Fatty Acids (FISH OIL PO) Take 1,000 Units by mouth daily.  Semaglutide, 1 MG/DOSE, 4 MG/3ML SOPN Inject 1 mg as directed once a week. 3 mL 3   SPIRIVA RESPIMAT 2.5 MCG/ACT AERS Inhale 2 puffs into the lungs daily. 4 g 5   VITAMIN D, CHOLECALCIFEROL, PO Take 1 tablet by mouth daily in the afternoon.     No current facility-administered medications for this encounter.    Allergies  Allergen Reactions   Latex Hives   Sulfa Antibiotics Rash      Social History   Socioeconomic History   Marital status: Divorced    Spouse name: Not on file   Number of children: 2   Years of education: 13.5   Highest education level: 12th grade  Occupational History    Comment: retired  Tobacco Use   Smoking status: Never   Smokeless tobacco: Never  Vaping Use   Vaping Use: Never used  Substance and Sexual Activity   Alcohol use: Yes    Comment: Rare   Drug use: No   Sexual activity: Not Currently  Other Topics Concern   Not on file  Social History Narrative   Lives alone. Likes to crafts and paint in her free time.   Social Determinants of Health   Financial Resource Strain: Low Risk    Difficulty of Paying Living Expenses: Not hard at all  Food Insecurity: No Food Insecurity   Worried About Charity fundraiser in the Last Year: Never true   [place] in the Last Year: Never true  Transportation Needs: No Transportation Needs   Lack of Transportation (Medical): No   Lack of Transportation  (Non-Medical): No  Physical Activity: Inactive   Days of Exercise per Week: 0 days   Minutes of Exercise per Session: 0 min  Stress: No Stress Concern Present   Feeling of Stress : Not at all  Social Connections: Moderately Isolated   Frequency of Communication with Friends and Family: More than three times a week   Frequency of Social Gatherings with Friends and Family: More than three times a week   Attends Religious Services: Never   Marine scientist or Organizations: Yes   Attends Music therapist: More than 4 times per year   Marital Status: Divorced  Human resources officer Violence: Not At Risk   Fear of Current or Ex-Partner: No   Emotionally Abused: No   Physically Abused: No   Sexually Abused: No      Family History  Problem Relation Age of Onset   Hypertension Mother    Hyperlipidemia Mother    Glaucoma Father    Heart disease Father    Hypertension Father    Diabetes Father    Hyperlipidemia Father    Skin cancer Father    Diabetes Paternal Grandmother    Colon cancer Neg Hx    Esophageal cancer Neg Hx     Vitals:   10/06/21 1113  BP: (!) 148/64  Pulse: 73  SpO2: 96%  Weight: 85.3 kg (188 lb)   Wt Readings from Last 3 Encounters:  10/06/21 85.3 kg (188 lb)  09/20/21 84.7 kg (186 lb 11.7 oz)  09/06/21 85.3 kg (188 lb)    PHYSICAL EXAM: General:  Walked in the clinic. No respiratory difficulty HEENT: normal Neck: supple. no JVD. Carotids 2+ bilat; no bruits. No lymphadenopathy or thryomegaly appreciated. Cor: PMI nondisplaced. Regular rate & rhythm. No rubs, gallops or murmurs. Lungs: clear Abdomen: soft, nontender, nondistended. No hepatosplenomegaly. No bruits or masses. Good bowel sounds. Extremities:  no cyanosis, clubbing, rash, edema Neuro: alert & oriented x 3, cranial nerves grossly intact. moves all 4 extremities w/o difficulty. Affect pleasant.  ECG: SR 69 bpm 1st degree heart block PR interval 216 ms    ASSESSMENT &  PLAN:  CAD -Echo 04/2021 EF 60-65%  - Previous PCI RCA  -LHC 04/2021-Severe complex stenosis of her LAD in the proximal mid and distal portion of the vessel.  Patent left circumflex with no significant stenosis, patent left main with no significant stenosis, patent RCA with RCA stents mild nonobstructive disease less than 50% stenosis and normal LVEDP.  LAD stenosis was unfavorable for PCI  -Plavix stopped 08/2021 due to nose bleed -On eliquis + high intensity statin.  - Completed cardiac rehab.  - Would benefit from ongoing structured program. Discussed Silver Sneakers. She will pursue at that [facility]  2. PAF In SR today.  On eliquis 5 mg twice a day  No bleeding issues.   3. L ankle weakness/foot ? PAD. Set up ABI.    Follow up with Dr Aundra Dubin in 3 months.

## 2021-10-06 NOTE — Patient Instructions (Addendum)
EKG done today.  No Labs done today.   START Jardiance 10mg  (1 tablet) by mouth daily.   No other medication changes were made. Please continue all current medications as prescribed.  Your physician recommends that you schedule a follow-up appointment in: 10 days for a lab only appointment(can be done at [facility]) and in 3 months with Dr.  If you have any questions or concerns before your next appointment please send Shirlee Latch a message through [place] or call our office at [phone].    TO LEAVE A MESSAGE FOR THE NURSE SELECT OPTION 2, PLEASE LEAVE A MESSAGE INCLUDING: YOUR NAME DATE OF BIRTH CALL BACK NUMBER REASON FOR CALL**this is important as we prioritize the call backs  YOU WILL RECEIVE A CALL BACK THE SAME DAY AS LONG AS YOU CALL BEFORE 4:00 PM   Do the following things EVERYDAY: Weigh yourself in the morning before breakfast. Write it down and keep it in a log. Take your medicines as prescribed Eat low salt foods--Limit salt (sodium) to 2000 mg per day.  Stay as active as you can everyday Limit all fluids for the day to less than 2 liters   At the [facility], you and your health needs are our priority. As part of our continuing mission to provide you with exceptional heart care, we have created designated Provider Care Teams. These Care Teams include your primary Cardiologist (physician) and Advanced Practice Providers (APPs- Physician Assistants and Nurse Practitioners) who all work together to provide you with the care you need, when you need it.   You may see any of the following providers on your designated Care Team at your next follow up: Dr [phone] Dr Arvilla Meres, NP Carron Curie, Robbie Lis [place], PharmD   Please be sure to bring in all your medications bottles to every appointment.

## 2021-10-09 ENCOUNTER — Other Ambulatory Visit (HOSPITAL_COMMUNITY): Payer: Self-pay

## 2021-10-09 ENCOUNTER — Other Ambulatory Visit: Payer: Self-pay

## 2021-10-09 ENCOUNTER — Ambulatory Visit (INDEPENDENT_AMBULATORY_CARE_PROVIDER_SITE_OTHER): Payer: PPO | Admitting: Family Medicine

## 2021-10-09 VITALS — BP 139/62 | HR 68 | Ht 63.0 in | Wt 185.0 lb

## 2021-10-09 DIAGNOSIS — R202 Paresthesia of skin: Secondary | ICD-10-CM

## 2021-10-09 DIAGNOSIS — R29898 Other symptoms and signs involving the musculoskeletal system: Secondary | ICD-10-CM | POA: Diagnosis not present

## 2021-10-09 DIAGNOSIS — E118 Type 2 diabetes mellitus with unspecified complications: Secondary | ICD-10-CM

## 2021-10-09 NOTE — Progress Notes (Signed)
Established Patient Office Visit  Subjective:  Patient ID: Tara Campbell, female    DOB: September 11, 1946  Age: 75 y.o. MRN: [id number]  CC:  Chief Complaint  Patient presents with   Numbness    HPI Tara Campbell presents for recent onset of left-sided weakness.  She actually stopped her Plavix per recommendation of the cardiologist in mid September so approximately 3 weeks ago.  She says about a week or so later she noticed a little bit of numbness and tingling in her left ankle.  Initially it happened while standing in the shower several times but would only last about 10 or 15 minutes and then go away but it gradually started to feel like it was going up into her left leg and hip and felt really more like a weakness.  She did not experience any pain with it.  Now she is actually experiencing weakness in her left arm and some numbness and tingling in her left hand.  The left hand numbness and tingling seems to be more frequent and more persistent.  But again the sensation is episodic.  She denies any lightheadedness or dizziness.  She does occasionally get headaches but does not feel like they are more significant than usual.  She denies any recent change or increase in neck or back pain.  She denies any change in speech or hearing or vision.  All the symptoms have been left-sided.  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   Otomycosis of right ear 09/27/2017   Paroxysmal atrial fibrillation ([place])    Thyroid disease     Past Surgical History:  Procedure Laterality Date   ANGIOPLASTY     CARDIAC CATHETERIZATION     COLONOSCOPY W/ POLYPECTOMY  01/04/2015   HEMORRHOID BANDING     LEFT  HEART CATH AND CORONARY ANGIOGRAPHY N/A 04/26/2021   Procedure: LEFT HEART CATH AND CORONARY ANGIOGRAPHY;  Surgeon: Sherren Mocha, MD;  Location: [facility];  Service: Cardiovascular;  Laterality: N/A;    Family History  Problem Relation Age of Onset   Hypertension Mother    Hyperlipidemia Mother    Glaucoma Father    Heart disease Father    Hypertension Father    Diabetes Father    Hyperlipidemia Father    Skin cancer Father    Diabetes Paternal Grandmother    Colon cancer Neg Hx    Esophageal cancer Neg Hx     Social History   Socioeconomic History   Marital status: Divorced    Spouse name: Not on file   Number of children: 2   Years of education: 13.5   Highest education level: 12th grade  Occupational History    Comment: retired  Tobacco Use   Smoking status: Never   Smokeless tobacco: Never  Vaping Use   Vaping Use: Never used  Substance and Sexual Activity   Alcohol use: Yes    Comment: Rare   Drug use: No   Sexual activity: Not Currently  Other Topics Concern   Not on file  Social History Narrative  Lives alone. Likes to crafts and paint in her free time.   Social Determinants of Health   Financial Resource Strain: Low Risk    Difficulty of Paying Living Expenses: Not hard at all  Food Insecurity: No Food Insecurity   Worried About Charity fundraiser in the Last Year: Never true   [place] in the Last Year: Never true  Transportation Needs: No Transportation Needs   Lack of Transportation (Medical): No   Lack of Transportation (Non-Medical): No  Physical Activity: Inactive   Days of Exercise per Week: 0 days   Minutes of Exercise per Session: 0 min  Stress: No Stress Concern Present   Feeling of Stress : Not at all  Social Connections: Moderately Isolated   Frequency of Communication with Friends and Family: More than three times a week   Frequency of Social Gatherings with Friends and Family: More than three times a week    Attends Religious Services: Never   Marine scientist or Organizations: Yes   Attends Music therapist: More than 4 times per year   Marital Status: Divorced  Human resources officer Violence: Not At Risk   Fear of Current or Ex-Partner: No   Emotionally Abused: No   Physically Abused: No   Sexually Abused: No    Outpatient Medications Prior to Visit  Medication Sig Dispense Refill   acetaminophen (TYLENOL) 650 MG CR tablet Take 1 tablet (650 mg total) by mouth every 8 (eight) hours as needed for pain. 90 tablet 3   albuterol (VENTOLIN HFA) 108 (90 Base) MCG/ACT inhaler Inhale 2 puffs into the lungs every 6 (six) hours as needed for wheezing. 18 g prn   allopurinol (ZYLOPRIM) 300 MG tablet Take 1 tablet (300 mg total) by mouth 2 (two) times daily. 180 tablet 1   atenolol (TENORMIN) 100 MG tablet Take 1 tablet (100 mg total) by mouth daily. 90 tablet 1   atorvastatin (LIPITOR) 80 MG tablet Take 1 tablet (80 mg total) by mouth at bedtime. 90 tablet 3   blood glucose meter kit and supplies KIT Check morning fasting blood glucose daily and up to 4 times daily as directed 1 each 0   Cyanocobalamin (B-12 PO) Take 1,000 mcg by mouth daily.     empagliflozin (JARDIANCE) 10 MG TABS tablet Take 1 tablet (10 mg total) by mouth daily before breakfast. 30 tablet 11   EPINEPHrine 0.3 mg/0.3 mL IJ SOAJ injection Inject 0.3 mg into the muscle as needed for anaphylaxis. Call 9-1-1 after use. 1 each 2   Evolocumab with Infusor ([place]) 420 MG/3.5ML SOCT Inject 420 mg into the skin every 30 (thirty) days. 3.6 mL 11   ezetimibe (ZETIA) 10 MG tablet TAKE ONE TABLET BY MOUTH EVERY DAY 90 tablet 0   fluticasone (FLONASE) 50 MCG/ACT nasal spray Place 1 spray into both nostrils daily. 16 g 3   glucose blood test strip To be used twice daily for testing blood sugars. E11.65 200 each 11   ipratropium-albuterol (DUONEB) 0.5-2.5 (3) MG/3ML SOLN Take 3 mLs by nebulization every 6 (six)  hours as needed. 3 mL PRN   irbesartan-hydrochlorothiazide (AVALIDE) 300-12.5 MG tablet Take 0.5 tablets by mouth daily. 45 tablet 1   isosorbide mononitrate (IMDUR) 60 MG 24 hr tablet Take 1 tablet (60 mg total) by mouth daily. 90 tablet 1   levalbuterol (XOPENEX HFA) 45 MCG/ACT inhaler Inhale 1-2 puffs into the lungs every 6 (six) hours as needed for  wheezing or shortness of breath.     levothyroxine (SYNTHROID) 112 MCG tablet Take 1 tablet (112 mcg total) by mouth daily before breakfast. 90 tablet 1   Mepolizumab ([name]) 100 MG/ML SOAJ Inject 1 mL (100 mg total) into the skin every 28 (twenty-eight) days. 1 mL 5   metFORMIN (GLUCOPHAGE) 500 MG tablet Take 2 tablets (1,000 mg total) by mouth 2 (two) times daily with a meal. 360 tablet 1   nitroGLYCERIN (NITROSTAT) 0.4 MG SL tablet Place 1 tablet (0.4 mg total) under the tongue every 5 (five) minutes as needed for chest pain (or tightness). 30 tablet prn   Omega-3 Fatty Acids (FISH OIL PO) Take 1,000 Units by mouth daily.     Semaglutide, 1 MG/DOSE, 4 MG/3ML SOPN Inject 1 mg as directed once a week. 3 mL 3   VITAMIN D, CHOLECALCIFEROL, PO Take 1 tablet by mouth daily in the afternoon.     Cyanocobalamin (VITAMIN B-12 CR PO) Take 1 tablet by mouth daily in the afternoon.     fluticasone furoate-vilanterol (BREO ELLIPTA) 200-25 MCG/INH AEPB Inhale 1 puff into the lungs daily. Needs appt for further refills 60 each 6   SPIRIVA RESPIMAT 2.5 MCG/ACT AERS Inhale 2 puffs into the lungs daily. 4 g 5   No facility-administered medications prior to visit.    Allergies  Allergen Reactions   Latex Hives   Sulfa Antibiotics Rash    ROS Review of Systems    Objective:    Physical Exam Constitutional:      Appearance: Normal appearance. She is well-developed.  HENT:     Head: Normocephalic and atraumatic.  Cardiovascular:     Rate and Rhythm: Normal rate and regular rhythm.     Heart sounds: Normal heart sounds.  Pulmonary:     Effort:  Pulmonary effort is normal.     Breath sounds: Normal breath sounds.  Musculoskeletal:     Comments: Strength in the upper extremities and lower extremities is 5 out of 5 bilaterally.  Cervical spine with normal flexion and extension.  Slightly decreased rotation to the left and sidebending to the left compared to the right.  Skin:    General: Skin is warm and dry.  Neurological:     General: No focal deficit present.     Mental Status: She is alert and oriented to person, place, and time.     Comments: Negative Tinel's and Phalen's.  Patellar reflexes 1+ bilaterally.  Psychiatric:        Mood and Affect: Mood normal.        Behavior: Behavior normal.        Thought Content: Thought content normal.    BP 139/62   Pulse 68   Ht 5' 3" (1.6 m)   Wt 185 lb (83.9 kg)   SpO2 98%   BMI 32.77 kg/m  Wt Readings from Last 3 Encounters:  10/09/21 185 lb (83.9 kg)  10/06/21 188 lb (85.3 kg)  09/20/21 186 lb 11.7 oz (84.7 kg)     Health Maintenance Due  Topic Date Due   COVID-19 Vaccine (5 - Booster for Pfizer series) 05/19/2021    There are no preventive care reminders to display for this patient.  Lab Results  Component Value Date   TSH 2.65 04/24/2021   Lab Results  Component Value Date   WBC 7.2 04/27/2021   HGB 10.9 (L) 04/27/2021   HCT 32.4 (L) 04/27/2021   MCV 87.6 04/27/2021   PLT 265 04/27/2021  Lab Results  Component Value Date   NA 140 10/09/2021   K 4.6 10/09/2021   CO2 28 10/09/2021   GLUCOSE 98 10/09/2021   BUN 14 10/09/2021   CREATININE 0.85 10/09/2021   BILITOT 0.4 09/04/2021   ALKPHOS 100 05/29/2017   AST 11 09/04/2021   ALT 12 09/04/2021   PROT 6.4 09/04/2021   ALBUMIN 4.2 05/29/2017   CALCIUM 10.6 (H) 10/09/2021   ANIONGAP 9 04/27/2021   EGFR 71 10/09/2021   Lab Results  Component Value Date   CHOL 140 09/04/2021   Lab Results  Component Value Date   HDL 44 (L) 09/04/2021   Lab Results  Component Value Date   LDLCALC 68 09/04/2021    Lab Results  Component Value Date   TRIG 222 (H) 09/04/2021   Lab Results  Component Value Date   CHOLHDL 3.2 09/04/2021   Lab Results  Component Value Date   HGBA1C 6.5 (A) 09/06/2021      Assessment & Plan:   Problem List Items Addressed This Visit       Endocrine   Controlled diabetes mellitus type 2 with complications ([place]) - Primary   Relevant Orders   BASIC METABOLIC PANEL WITH GFR (Completed)   Other Visit Diagnoses     Weakness of left arm       Relevant Orders   MR Brain W Wo Contrast   Weakness of left leg       Relevant Orders   MR Brain W Wo Contrast   Paresthesias in left hand       Relevant Orders   MR Brain W Wo Contrast       Weakness of left arm and left leg-unclear etiology.  The symptoms seem to be coming and going and are almost episodic.  Its not persistent.  No appreciable weakness on exam today but she is not currently feeling the symptoms.  We did discuss further work-up with imaging to evaluate for any type of brain lesion such as mass fluid or other disorders such as a mass.  Also could be some type of spinal issue compressing the spinal cord.  So also consider further work-up of the cervical spine if the MRI of the brain is otherwise normal.  We will need to get an up-to-date kidney function before imaging is scheduled.  No orders of the defined types were placed in this encounter.   Follow-up: No follow-ups on file.    Beatrice Lecher, MD

## 2021-10-10 ENCOUNTER — Encounter: Payer: Self-pay | Admitting: Family Medicine

## 2021-10-10 DIAGNOSIS — R202 Paresthesia of skin: Secondary | ICD-10-CM

## 2021-10-10 DIAGNOSIS — R29898 Other symptoms and signs involving the musculoskeletal system: Secondary | ICD-10-CM

## 2021-10-10 LAB — BASIC METABOLIC PANEL WITH GFR
BUN: 14 mg/dL (ref 7–25)
CO2: 28 mmol/L (ref 20–32)
Calcium: 10.6 mg/dL — ABNORMAL HIGH (ref 8.6–10.4)
Chloride: 104 mmol/L (ref 98–110)
Creat: 0.85 mg/dL (ref 0.60–1.00)
Glucose, Bld: 98 mg/dL (ref 65–99)
Potassium: 4.6 mmol/L (ref 3.5–5.3)
Sodium: 140 mmol/L (ref 135–146)
eGFR: 71 mL/min/{1.73_m2} (ref >=60)

## 2021-10-10 NOTE — Progress Notes (Signed)
Hi Tara Campbell, kidney function looks great at 0.8.  Much better than 5 months ago.  Calcium level still little borderline similar to when it jumped up about 5 months ago but it is okay we will keep an eye on it.

## 2021-10-11 ENCOUNTER — Encounter: Payer: Self-pay | Admitting: Family Medicine

## 2021-10-14 ENCOUNTER — Other Ambulatory Visit: Payer: Self-pay

## 2021-10-14 ENCOUNTER — Ambulatory Visit (HOSPITAL_BASED_OUTPATIENT_CLINIC_OR_DEPARTMENT_OTHER)
Admission: RE | Admit: 2021-10-14 | Discharge: 2021-10-14 | Disposition: A | Discharge status: Home / Self Care | Payer: PPO | Source: Ambulatory Visit | Attending: Family Medicine | Admitting: Family Medicine

## 2021-10-14 DIAGNOSIS — R200 Anesthesia of skin: Secondary | ICD-10-CM | POA: Diagnosis not present

## 2021-10-14 DIAGNOSIS — R202 Paresthesia of skin: Secondary | ICD-10-CM | POA: Diagnosis not present

## 2021-10-14 DIAGNOSIS — R29898 Other symptoms and signs involving the musculoskeletal system: Secondary | ICD-10-CM | POA: Diagnosis not present

## 2021-10-14 DIAGNOSIS — J3489 Other specified disorders of nose and nasal sinuses: Secondary | ICD-10-CM | POA: Diagnosis not present

## 2021-10-14 MED ORDER — GADOBUTROL 1 MMOL/ML IV SOLN
8.0000 mL | Freq: Once | INTRAVENOUS | Status: AC | PRN
  Administered 2021-10-14: 8 mL via INTRAVENOUS

## 2021-10-16 NOTE — Progress Notes (Signed)
Hi Sandy, great news!  Your brain MRI looks normal no sign of stroke or mass or bleed on the brain.  You do have a little thickening of the lining of your paranasal sinuses.  This can come from allergies.  And you have a little fluid in your right ear.  You might benefit from taking an antihistamine such as Claritin, Zyrtec, or Allegra.  But I do not think this has anything to do with the numbness and tingling that you have been experiencing.  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  Again this is not causing your symptoms but we will sometimes see this when the tiny blood vessels in the brain have some plaque buildup that affects blood flow.  It is just most important that you keep taking your atorvastatin and working on healthy diet and regular exercise.  Neck step for the weakness, would be to see neurology.  If you are okay with that then I am happy to place a referral.

## 2021-10-17 ENCOUNTER — Encounter: Payer: Self-pay | Admitting: Neurology

## 2021-10-17 ENCOUNTER — Telehealth: Payer: Self-pay | Admitting: Pulmonary Disease

## 2021-10-17 DIAGNOSIS — J4551 Severe persistent asthma with (acute) exacerbation: Secondary | ICD-10-CM

## 2021-10-17 NOTE — Telephone Encounter (Signed)
Called patient about [name] re-enrollment. She states she has been taking [name] and just received application in mail.  She expressed frustration with making appointment with Dr. Isaiah Serge. States she has called 4 times to have an appointment but provider has no availability. I transferred her to front desk to have appt scheduled and advised that she may be seen by APP if needed but Dr. Isaiah Serge is in clinic the week after Thanksgiving and may have availability.  Advised her to bring completed [name] PAP re-enrollment application to this visit, but that we do need to see her in clinic to continue prescribing [name] for her  Chesley Mires, PharmD, MPH, BCPS Clinical Pharmacist (Rheumatology and Pulmonology)

## 2021-10-17 NOTE — Telephone Encounter (Signed)
Spoke with rep regarding PAP reenrollment process. Stated that I would investigate and f/u with pt.

## 2021-10-18 ENCOUNTER — Ambulatory Visit: Payer: PPO

## 2021-10-18 ENCOUNTER — Telehealth: Payer: Self-pay | Admitting: Pharmacist

## 2021-10-18 MED ORDER — TRELEGY ELLIPTA 100-62.5-25 MCG/ACT IN AEPB: 1.0000 | INHALATION_SPRAY | Freq: Every day | RESPIRATORY_TRACT | 4 refills | Status: DC

## 2021-10-18 NOTE — Addendum Note (Signed)
Addended by: Nani Gasser D on: 10/18/2021 02:14 PM   Modules accepted: Orders

## 2021-10-18 NOTE — Progress Notes (Deleted)
Chronic Care Management Pharmacy Note  10/18/2021 Name:  Tara Campbell MRN:  [id number] DOB:  1946/02/20  Summary: addressed HTN, HLD, DM, Afib - primarily following up for progress w/financial assistance paperwork. Repatha approved! Patient still working on [employer] paperwork, will let me know when she needs further support/guidance from me.  Recommendations/Changes made from today's visit: none, doing well  Plan: f/u with pharmacist in 2 months (after pt travels to [place] to help daughter recover from knee surgery)  Subjective: Tara Campbell is an 75 y.o. year old female who is a primary patient of Metheney, Rene Kocher, MD.  The CCM team was consulted for assistance with disease management and care coordination needs.    Engaged with patient by telephone for follow up visit in response to provider referral for pharmacy case management and/or care coordination services.   Consent to Services:  The patient was given information about Chronic Care Management services, agreed to services, and gave verbal consent prior to initiation of services.  Please see initial visit note for detailed documentation.   Patient Care Team: Hali Marry, MD as PCP - General (Family Medicine) Juanito Doom, MD as Consulting Physician (Pulmonary Disease) Lelon Perla, MD as Consulting Physician (Cardiology) Darius Bump, [facility] as Pharmacist (Pharmacist)   Objective:  Lab Results  Component Value Date   CREATININE 0.85 10/09/2021   CREATININE 1.11 (H) 04/27/2021   CREATININE 1.05 (H) 04/26/2021    Lab Results  Component Value Date   HGBA1C 6.5 (A) 09/06/2021   Last diabetic Eye exam:  Lab Results  Component Value Date/Time   HMDIABEYEEXA No Retinopathy 04/05/2021 12:00 AM    Last diabetic Foot exam: No results found for: HMDIABFOOTEX      Component Value Date/Time   CHOL 140 09/04/2021 0958   TRIG 222 (H) 09/04/2021 0958   HDL 44 (L) 09/04/2021 0958    CHOLHDL 3.2 09/04/2021 0958   VLDL 50 (H) 04/26/2021 0449   LDLCALC 68 09/04/2021 0958    Hepatic Function Latest Ref Rng & Units 09/04/2021 04/24/2021 03/22/2021  Total Protein 6.1 - 8.1 g/dL 6.4 7.1 7.0  Albumin 3.6 - 5.1 g/dL - - -  AST 10 - 35 U/L '11 19 13  ' ALT 6 - 29 U/L '12 17 16  ' Alk Phosphatase 33 - 130 U/L - - -  Total Bilirubin 0.2 - 1.2 mg/dL 0.4 0.4 0.5  Bilirubin, Direct 0.0 - 0.2 mg/dL 0.1 - -    Lab Results  Component Value Date/Time   TSH 2.65 04/24/2021 12:00 AM   TSH 3.15 03/22/2021 12:00 AM    CBC Latest Ref Rng & Units 04/27/2021 04/26/2021 04/25/2021  WBC 4.0 - 10.5 K/uL 7.2 9.0 8.8  Hemoglobin 12.0 - 15.0 g/dL 10.9(L) 12.4 12.6  Hematocrit 36.0 - 46.0 % 32.4(L) 37.2 37.5  Platelets 150 - 400 K/uL 265 279 305    Social History   Tobacco Use  Smoking Status Never  Smokeless Tobacco Never   BP Readings from Last 3 Encounters:  10/09/21 139/62  10/06/21 (!) 148/64  09/06/21 (!) 121/52   Pulse Readings from Last 3 Encounters:  10/09/21 68  10/06/21 73  09/06/21 73   Wt Readings from Last 3 Encounters:  10/09/21 185 lb (83.9 kg)  10/06/21 188 lb (85.3 kg)  09/20/21 186 lb 11.7 oz (84.7 kg)    Assessment: Review of patient past medical history, allergies, medications, health status, including review of  consultants reports, laboratory and other test data, was performed as part of comprehensive evaluation and provision of chronic care management services.   SDOH:  (Social Determinants of Health) assessments and interventions performed:    CCM Care Plan  Allergies  Allergen Reactions   Latex Hives   Sulfa Antibiotics Rash    Medications Reviewed Today     Reviewed by Hali Marry, MD (Physician) on 10/11/21 at 0800  Med List Status: <None>   Medication Order Taking? Sig Documenting Provider Last Dose Status Informant  acetaminophen (TYLENOL) 650 MG CR tablet [id number] No Take 1 tablet (650 mg total) by mouth every 8 (eight) hours as  needed for pain. Silverio Decamp, MD Taking Active   albuterol (VENTOLIN HFA) 108 (90 Base) MCG/ACT inhaler [id number] No Inhale 2 puffs into the lungs every 6 (six) hours as needed for wheezing. Hali Marry, MD Taking Active Self  allopurinol (ZYLOPRIM) 300 MG tablet [id number] No Take 1 tablet (300 mg total) by mouth 2 (two) times daily. Hali Marry, MD Taking Active   atenolol (TENORMIN) 100 MG tablet [id number] No Take 1 tablet (100 mg total) by mouth daily. Hali Marry, MD Taking Active   atorvastatin (LIPITOR) 80 MG tablet [id number] No Take 1 tablet (80 mg total) by mouth at bedtime. Hali Marry, MD Taking Active Self  blood glucose meter kit and supplies KIT [id number] No Check morning fasting blood glucose daily and up to 4 times daily as directed [name], PA-C Taking Active Self  Cyanocobalamin (B-12 PO) [id number] No Take 1,000 mcg by mouth daily. [provider] Taking Active Self  empagliflozin (JARDIANCE) 10 MG TABS tablet [id number]  Take 1 tablet (10 mg total) by mouth daily before breakfast. Larey Dresser, MD  Active   EPINEPHrine 0.3 mg/0.3 mL IJ SOAJ injection [id number] No Inject 0.3 mg into the muscle as needed for anaphylaxis. Call 9-1-1 after use. Marshell Garfinkel, MD Taking Active   Evolocumab with Infusor ([place]) 420 MG/3.5ML SOCT [id number] No Inject 420 mg into the skin every 30 (thirty) days. Lelon Perla, MD Taking Active Self  ezetimibe (ZETIA) 10 MG tablet [id number] No TAKE ONE TABLET BY MOUTH EVERY DAY [place]ed Denice Bors, MD Taking Active Self  fluticasone (FLONASE) 50 MCG/ACT nasal spray [id number] No Place 1 spray into both nostrils daily. [name], [place] Taking Active Self  glucose blood test strip [id number] No To be used twice daily for testing blood sugars. E11.65 Hali Marry, MD Taking Active Self  ipratropium-albuterol (DUONEB) 0.5-2.5 (3) MG/3ML  SOLN [id number] No Take 3 mLs by nebulization every 6 (six) hours as needed. Hali Marry, MD Taking Active Self  irbesartan-hydrochlorothiazide (AVALIDE) 300-12.5 MG tablet [id number] No Take 0.5 tablets by mouth daily. Hali Marry, MD Taking Active   isosorbide mononitrate (IMDUR) 60 MG 24 hr tablet [id number] No Take 1 tablet (60 mg total) by mouth daily. Lelon Perla, MD Taking Active Self  levalbuterol [facility] HFA) 45 MCG/ACT inhaler [id number] No Inhale 1-2 puffs into the lungs every 6 (six) hours as needed for wheezing or shortness of breath. [provider] Taking Active Self  levothyroxine (SYNTHROID) 112 MCG tablet [id number] No Take 1 tablet (112 mcg total) by mouth daily before breakfast. Hali Marry, MD Taking Active   Mepolizumab ([name]) 100 MG/ML Darden Palmer [id number] No Inject 1 mL (100 mg total) into the skin every 28 (twenty-eight) days. Marshell Garfinkel, MD Taking Active Self  metFORMIN (  GLUCOPHAGE) 500 MG tablet [id number] No Take 2 tablets (1,000 mg total) by mouth 2 (two) times daily with a meal. Hali Marry, MD Taking Active Self  nitroGLYCERIN (NITROSTAT) 0.4 MG SL tablet [id number] No Place 1 tablet (0.4 mg total) under the tongue every 5 (five) minutes as needed for chest pain (or tightness). Hali Marry, MD Taking Active   Omega-3 Fatty Acids (FISH OIL PO) [id number] No Take 1,000 Units by mouth daily. [provider] Taking Active Self  Semaglutide, 1 MG/DOSE, 4 MG/3ML SOPN [id number] No Inject 1 mg as directed once a week. Hali Marry, MD Taking Active   VITAMIN D, CHOLECALCIFEROL, PO [id number] No Take 1 tablet by mouth daily in the afternoon. 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 10/02/2021   BMI 33.0-33.9,adult 09/06/2021   Angina pectoris ([place]) 06/23/2021   Hyperlipidemia  04/27/2021   History of non-ST elevation myocardial infarction (NSTEMI) 04/25/2021   Hilar adenopathy 10/13/2018   Mediastinal adenopathy 10/13/2018   Dupuytren's contracture of left hand 08/28/2018   Trigger finger, left index finger 08/28/2018   Non-seasonal allergic rhinitis 08/26/2018   Type 2 diabetes mellitus with diabetic chronic kidney disease ([place]) 08/06/2018   Controlled diabetes mellitus type 2 with complications ([place]) 97/41/6384   Class 1 obesity due to excess calories with serious comorbidity in adult 12/27/2017   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   Encounter for long-term (current) use of medications 11/28/2017   Anticoagulant long-term use 10/21/2017   Sensorineural hearing loss (SNHL) of both ears 09/27/2017   Hypertension goal BP (blood pressure) < 140/90 08/29/2017   Coronary artery disease involving native coronary artery of native heart with angina pectoris ([place]) 08/29/2017   Hearing difficulty of both ears 05/29/2017   Trochanteric bursitis, right hip 05/29/2017   Gout of ankle 04/08/2017   History of myocardial infarct at age greater than 40 years 01/23/2017   Mixed diabetic hyperlipidemia associated with type 2 diabetes mellitus ([place]) 01/23/2017   Acquired hypothyroidism 01/23/2017   Severe persistent allergic asthma 01/16/2017   Paroxysmal atrial fibrillation ([place]) 01/16/2017   Cardiomegaly 01/16/2017    Immunization History  Administered Date(s) Administered   Fluad Quad(high Dose 65+) 09/22/2019, 09/06/2021   Influenza, High Dose Seasonal PF 08/29/2017, 09/07/2018, 08/13/2020   PFIZER(Purple Top)SARS-COV-2 Vaccination 12/13/2019, 01/04/2020, 09/03/2020, 03/24/2021   Pneumococcal Conjugate-13 11/16/2019   Pneumococcal Polysaccharide-23 08/29/2017   Tdap 05/29/2017   Zoster Recombinat ([place]hing[place]) 02/28/2018, 06/12/2018    Conditions to be addressed/monitored: Atrial Fibrillation, HTN, HLD, and DMII  There are no care plans that you recently modified to  display for this patient.   Medication Assistance:  Application for repatha confirmed! Coverage through 12/02/21. Application for Eliquis pending.  Patient's preferred pharmacy is:  [place], [place] - [address] [place] [address] Phone: [phone] Fax: [phone]  [facility]) - [address], [place] [address] [place] [address] Phone: [phone] Fax: [phone]  Uses pill box? Yes Pt endorses 100% compliance  Follow Up:  Patient agrees to Care Plan and Follow-up.  Plan: Face to Face appointment with care management team member scheduled for: 2 months  Darius Bump

## 2021-10-20 ENCOUNTER — Encounter (HOSPITAL_COMMUNITY): Payer: PPO

## 2021-10-20 NOTE — Telephone Encounter (Signed)
Patient did not return for follow-up visit for CCM services with pharmacist.  Will route to schedule team for reschedule.  Tara Campbell

## 2021-10-22 ENCOUNTER — Other Ambulatory Visit: Payer: Self-pay | Admitting: Family Medicine

## 2021-10-23 ENCOUNTER — Telehealth: Payer: Self-pay | Admitting: *Deleted

## 2021-10-23 NOTE — Chronic Care Management (AMB) (Signed)
  Care Management   Note  10/23/2021 Name: Tara Campbell MRN: [id number] DOB: 02-16-1946  Tara Campbell is a 75 y.o. year old female who is a primary care patient of Agapito Games, MD and is actively engaged with the care management team. I reached out to Oneida Arenas by phone today to assist with re-scheduling a follow up visit with the RN Case Manager  Follow up plan: Unsuccessful telephone outreach attempt made. A HIPAA compliant phone message was left for the patient providing contact information and requesting a return call.   Burman Nieves, CCMA Care Guide, Embedded Care Coordination [facility]  Care Management  Direct Dial: [phone]

## 2021-10-31 ENCOUNTER — Encounter: Payer: Self-pay | Admitting: Pulmonary Disease

## 2021-10-31 ENCOUNTER — Ambulatory Visit (INDEPENDENT_AMBULATORY_CARE_PROVIDER_SITE_OTHER): Payer: PPO | Admitting: Pulmonary Disease

## 2021-10-31 ENCOUNTER — Other Ambulatory Visit: Payer: Self-pay

## 2021-10-31 VITALS — BP 148/72 | HR 88 | Temp 98.0°F | Ht 62.5 in | Wt 186.2 lb

## 2021-10-31 DIAGNOSIS — J4551 Severe persistent asthma with (acute) exacerbation: Secondary | ICD-10-CM

## 2021-10-31 MED ORDER — BUDESONIDE-FORMOTEROL FUMARATE 80-4.5 MCG/ACT IN AERO: 2.0000 | INHALATION_SPRAY | Freq: Two times a day (BID) | RESPIRATORY_TRACT | 5 refills | Status: DC

## 2021-10-31 NOTE — Patient Instructions (Signed)
I am glad you are doing well with your breathing We will change the Trelegy to Breo 100 Continue Singulair Follow-up in 6 months

## 2021-10-31 NOTE — Progress Notes (Signed)
Tara Campbell    [id number]    [birth year]  Primary Care Physician:Metheney, Rene Kocher, MD  Referring Physician: Hali Marry, MD [place],  [place] [address]  Chief complaint:   Follow up for asthma Tara Campbell in March [address]  HPI: 75 year old with severe persistent asthma, nasal polyposis, allergies She is on maximal therapy with LABA/LAMA/ICS, Singulair and is getting allergy shots with the allergist at [facility] to have significant symptoms with recurrent exacerbations.  She is needed to go on prednisone 4 times the past year. Has chronic dyspnea on exertion, wheeze  Pets: No pets Occupation: Used to work in Office manager Exposures: No mold, hot tub, Customer service manager.  No feather pillows or comforters Smoking history: Never smoker Travel history: Originally from [place].  Moved to [facility] in [place] Relevant family history: No significant family history of lung disease  Interim history: Started nucala in March with significant improvement in symptoms.  She has not required any more rounds of prednisone Continues on Trelegy inhaler and Singulair   Outpatient Encounter Medications as of 10/31/2021  Medication Sig   acetaminophen (TYLENOL) 650 MG CR tablet Take 1 tablet (650 mg total) by mouth every 8 (eight) hours as needed for pain.   albuterol (VENTOLIN HFA) 108 (90 Base) MCG/ACT inhaler Inhale 2 puffs into the lungs every 6 (six) hours as needed for wheezing.   allopurinol (ZYLOPRIM) 300 MG tablet Take 1 tablet (300 mg total) by mouth 2 (two) times daily.   atenolol (TENORMIN) 100 MG tablet Take 1 tablet (100 mg total) by mouth daily.   atorvastatin (LIPITOR) 80 MG tablet Take 1 tablet (80 mg total) by mouth at bedtime.   blood glucose meter kit and supplies KIT Check morning fasting blood glucose daily and up to 4 times daily as directed   Cyanocobalamin (B-12 PO) Take 1,000 mcg by mouth daily.    empagliflozin (JARDIANCE) 10 MG TABS tablet Take 1 tablet (10 mg total) by mouth daily before breakfast.   EPINEPHrine 0.3 mg/0.3 mL IJ SOAJ injection Inject 0.3 mg into the muscle as needed for anaphylaxis. Call 9-1-1 after use.   Evolocumab with Infusor ([place]) 420 MG/3.5ML SOCT Inject 420 mg into the skin every 30 (thirty) days.   ezetimibe (ZETIA) 10 MG tablet TAKE ONE TABLET BY MOUTH EVERY DAY   fluticasone (FLONASE) 50 MCG/ACT nasal spray Place 1 spray into both nostrils daily.   Fluticasone-Umeclidin-Vilant (TRELEGY ELLIPTA) 100-62.5-25 MCG/ACT AEPB Inhale 1 puff into the lungs daily.   glucose blood test strip To be used twice daily for testing blood sugars. E11.65   ipratropium-albuterol (DUONEB) 0.5-2.5 (3) MG/3ML SOLN Take 3 mLs by nebulization every 6 (six) hours as needed.   irbesartan-hydrochlorothiazide (AVALIDE) 300-12.5 MG tablet Take 0.5 tablets by mouth daily.   isosorbide mononitrate (IMDUR) 60 MG 24 hr tablet Take 1 tablet (60 mg total) by mouth daily.   levothyroxine (SYNTHROID) 112 MCG tablet Take 1 tablet (112 mcg total) by mouth daily before breakfast.   Mepolizumab ([name]) 100 MG/ML SOAJ Inject 1 mL (100 mg total) into the skin every 28 (twenty-eight) days.   metFORMIN (GLUCOPHAGE) 500 MG tablet TAKE TWO TABLETS BY MOUTH TWICE DAILY WITH A MEAL   nitroGLYCERIN (NITROSTAT) 0.4 MG SL tablet Place 1 tablet (0.4 mg total) under the tongue every 5 (five) minutes as needed for chest pain (or tightness).   Omega-3 Fatty Acids (FISH OIL PO)  Take 1,000 Units by mouth daily.   Semaglutide, 1 MG/DOSE, 4 MG/3ML SOPN Inject 1 mg as directed once a week.   VITAMIN D, CHOLECALCIFEROL, PO Take 1 tablet by mouth daily in the afternoon.   [DISCONTINUED] levalbuterol (XOPENEX HFA) 45 MCG/ACT inhaler Inhale 1-2 puffs into the lungs every 6 (six) hours as needed for wheezing or shortness of breath.   No facility-administered encounter medications on file as of 10/31/2021.     Physical Exam: Blood pressure (!) 148/72, pulse 88, temperature 98 F (36.7 C), temperature source Oral, height 5' 2.5" (1.588 m), weight 186 lb 3.2 oz (84.5 kg), SpO2 97 %. Gen:      No acute distress HEENT:  EOMI, sclera anicteric Neck:     No masses; no thyromegaly Lungs:    Clear to auscultation bilaterally; normal respiratory effort CV:         Regular rate and rhythm; no murmurs Abd:      + bowel sounds; soft, non-tender; no palpable masses, no distension Ext:    No edema; adequate peripheral perfusion Skin:      Warm and dry; no rash Neuro: alert and oriented x 3 Psych: normal mood and affect   Data Reviewed: Imaging: Chest x-ray 08/16/2020-no acute cardiopulmonary disease.  Chest x-ray 04/25/2021-no active cardiopulmonary disease I have reviewed the images personally  PFTs: 11/17/2020 FVC 1.74 [62%], FEV1 1.17 (56%], F/F 67 No significant obstruction, restriction suggested by reduced lung volumes.  No bronchodilator response  ACT score 02/21/2021-18 ACT score 10/31/2021-24  Labs: CBC 08/05/2018-WBC 7.7, eos 10.5%, absolute eosinophil count 809 CBC 02/21/2021-WBC 8.4, eos 12.4%, absolute eosinophil count 1042  IgE 12/26/2017-712 IgE 02/21/2021-1191  Alpha-1 antitrypsin 11/17/2020-160  Assessment:  Severe persistent eosinophilic asthma Allergic rhinitis with nasal polyposis Continue nucala.  She has good response to biologic therapy Will de-escalate her inhaler therapy.  Change Trelegy to Breo 100 Continue Singulair for now.  If she continues to be stable then we can stop this as well  Plan/Recommendations: Change Trelegy to Breo Continue Singulair  Praveen Mannam MD [facility] 10/31/2021, 12:07 PM  CC: Metheney, Catherine D, *   

## 2021-10-31 NOTE — Progress Notes (Signed)
Breo 100 not preferred by insurance.  Symbicort 80, 2 puffs, twice daily, prescription sent to pharmacy.

## 2021-11-01 NOTE — Telephone Encounter (Signed)
Patient portion received, provider portion will be placed in Dr. Shirlee More box for signature.

## 2021-11-03 ENCOUNTER — Encounter: Payer: Self-pay | Admitting: Family Medicine

## 2021-11-03 ENCOUNTER — Emergency Department
Admission: EM | Admit: 2021-11-03 | Discharge: 2021-11-03 | Disposition: A | Discharge status: Home / Self Care | Payer: PPO | Source: Home / Self Care

## 2021-11-03 DIAGNOSIS — H109 Unspecified conjunctivitis: Secondary | ICD-10-CM | POA: Diagnosis not present

## 2021-11-03 DIAGNOSIS — J309 Allergic rhinitis, unspecified: Secondary | ICD-10-CM

## 2021-11-03 MED ORDER — FEXOFENADINE HCL 180 MG PO TABS: 180.0000 mg | ORAL_TABLET | Freq: Every day | ORAL | 0 refills | Status: DC

## 2021-11-03 MED ORDER — MOXIFLOXACIN HCL 0.5 % OP SOLN: 1.0000 [drp] | Freq: Three times a day (TID) | OPHTHALMIC | 0 refills | Status: AC

## 2021-11-03 NOTE — Discharge Instructions (Addendum)
Advised patient to take medication as directed.  Advised patient hold current Flonase and take Allegra for the next 5 days for concurrent postnasal drainage/drip.  Advised patient may use Allegra as needed afterwards for postnasal drainage/drip.  Encouraged patient to increase daily water intake while taking these medications.

## 2021-11-03 NOTE — ED Triage Notes (Signed)
Pt c/o red and itchy in RT eye that started last night. Found out grandson recently dx with pink eye. Also c/o congestion with yellow mucous. Nyquil  and warm compresses prn.

## 2021-11-03 NOTE — ED Provider Notes (Signed)
Tara Campbell CARE    CSN: [id number] Arrival date & time: 11/03/21  1521      History   Chief Complaint Chief Complaint  Patient presents with   Conjunctivitis    Bilateral    HPI Tara Campbell is a 75 y.o. female.   HPI 75 year old female presents with right eye itching for 1 night.  Patient reports her grandson was recently diagnosed with pinkeye.  PMH significant for MI x 2, megaly, and paroxysmal atrial fibrillation.   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 10/02/2021   BMI 33.0-33.9,adult 09/06/2021   Angina pectoris ([place]) 06/23/2021   Hyperlipidemia 04/27/2021   History of non-ST elevation myocardial infarction (NSTEMI) 04/25/2021   Hilar adenopathy 10/13/2018   Mediastinal adenopathy 10/13/2018   Dupuytren's contracture of left hand 08/28/2018   Trigger finger, left index finger 08/28/2018   Non-seasonal allergic rhinitis 08/26/2018   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   Controlled diabetes mellitus type 2 with complications ([place]) 98/92/1194   Class 1 obesity due to excess calories with serious comorbidity in adult 12/27/2017   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   Encounter for long-term (current) use of medications 11/28/2017   Anticoagulant long-term use 10/21/2017   Sensorineural hearing loss (SNHL) of both ears 09/27/2017   Hypertension goal BP (blood pressure) < 140/90 08/29/2017   Coronary  artery disease involving native coronary artery of native heart with angina pectoris ([place]) 08/29/2017   Hearing difficulty of both ears 05/29/2017   Trochanteric bursitis, right hip 05/29/2017   Gout of ankle 04/08/2017   History of myocardial infarct at age greater than 26 years 01/23/2017   Mixed diabetic hyperlipidemia associated with type 2 diabetes mellitus ([place]) 01/23/2017   Acquired hypothyroidism 01/23/2017   Severe persistent allergic asthma 01/16/2017   Paroxysmal atrial fibrillation ([place]) 01/16/2017   Cardiomegaly 01/16/2017    Past Surgical History:  Procedure Laterality Date   ANGIOPLASTY     CARDIAC CATHETERIZATION     COLONOSCOPY W/ POLYPECTOMY  01/04/2015   HEMORRHOID BANDING     LEFT HEART CATH AND CORONARY ANGIOGRAPHY N/A 04/26/2021   Procedure: LEFT HEART CATH AND CORONARY ANGIOGRAPHY;  Surgeon: Sherren Mocha, MD;  Location: [facility];  Service: Cardiovascular;  Laterality: N/A;    OB History   No obstetric history on file.      Home Medications    Prior to Admission medications   Medication Sig Start Date End Date Taking? Authorizing Provider  fexofenadine (ALLEGRA ALLERGY) 180 MG tablet Take 1 tablet (180 mg total) by mouth daily for 15 days. 11/03/21 11/18/21 Yes Eliezer Lofts, FNP  moxifloxacin (VIGAMOX) 0.5 % ophthalmic solution Place 1 drop into both eyes 3 (three) times daily for 5 days. 11/03/21 11/08/21 Yes Eliezer Lofts, FNP  acetaminophen (TYLENOL) 650 MG CR tablet Take 1  tablet (650 mg total) by mouth every 8 (eight) hours as needed for pain. 10/02/21   Silverio Decamp, MD  albuterol (VENTOLIN HFA) 108 (90 Base) MCG/ACT inhaler Inhale 2 puffs into the lungs every 6 (six) hours as needed for wheezing. 11/16/19   Hali Marry, MD  allopurinol (ZYLOPRIM) 300 MG tablet Take 1 tablet (300 mg total) by mouth 2 (two) times daily. 09/08/21   Hali Marry, MD  atenolol (TENORMIN) 100 MG tablet Take 1 tablet (100 mg total) by  mouth daily. 09/06/21   Hali Marry, MD  atorvastatin (LIPITOR) 80 MG tablet Take 1 tablet (80 mg total) by mouth at bedtime. 03/24/21   Hali Marry, MD  blood glucose meter kit and supplies KIT Check morning fasting blood glucose daily and up to 4 times daily as directed 04/03/18   [name], PA-C  budesonide-formoterol [facility]) 80-4.5 MCG/ACT inhaler Inhale 2 puffs into the lungs in the morning and at bedtime. 10/31/21   Mannam, Hart Robinsons, MD  Cyanocobalamin (B-12 PO) Take 1,000 mcg by mouth daily.    [provider]  empagliflozin (JARDIANCE) 10 MG TABS tablet Take 1 tablet (10 mg total) by mouth daily before breakfast. 10/06/21   Larey Dresser, MD  EPINEPHrine 0.3 mg/0.3 mL IJ SOAJ injection Inject 0.3 mg into the muscle as needed for anaphylaxis. Call 9-1-1 after use. 03/06/21   Mannam, Hart Robinsons, MD  Evolocumab with Infusor ([place]) 420 MG/3.5ML SOCT Inject 420 mg into the skin every 30 (thirty) days. 05/29/21   Lelon Perla, MD  ezetimibe (ZETIA) 10 MG tablet TAKE ONE TABLET BY MOUTH EVERY DAY 06/22/21   Lelon Perla, MD  fluticasone (FLONASE) 50 MCG/ACT nasal spray Place 1 spray into both nostrils daily. 08/25/18   [name], PA-C  glucose blood test strip To be used twice daily for testing blood sugars. E11.65 04/03/21   Hali Marry, MD  ipratropium-albuterol (DUONEB) 0.5-2.5 (3) MG/3ML SOLN Take 3 mLs by nebulization every 6 (six) hours as needed. 01/25/20   Hali Marry, MD  irbesartan-hydrochlorothiazide (AVALIDE) 300-12.5 MG tablet Take 0.5 tablets by mouth daily. 09/06/21   Hali Marry, MD  isosorbide mononitrate (IMDUR) 60 MG 24 hr tablet Take 1 tablet (60 mg total) by mouth daily. 06/19/21   Lelon Perla, MD  levothyroxine (SYNTHROID) 112 MCG tablet Take 1 tablet (112 mcg total) by mouth daily before breakfast. 09/06/21   Hali Marry, MD  Mepolizumab ([name]) 100  MG/ML SOAJ Inject 1 mL (100 mg total) into the skin every 28 (twenty-eight) days. 03/08/21   Mannam, Hart Robinsons, MD  metFORMIN (GLUCOPHAGE) 500 MG tablet TAKE TWO TABLETS BY MOUTH TWICE DAILY WITH A MEAL 10/23/21   Hali Marry, MD  nitroGLYCERIN (NITROSTAT) 0.4 MG SL tablet Place 1 tablet (0.4 mg total) under the tongue every 5 (five) minutes as needed for chest pain (or tightness). 04/03/21   Hali Marry, MD  Omega-3 Fatty Acids (FISH OIL PO) Take 1,000 Units by mouth daily.    [provider]  Semaglutide, 1 MG/DOSE, 4 MG/3ML SOPN Inject 1 mg as directed once a week. 09/06/21   Hali Marry, MD  VITAMIN D, CHOLECALCIFEROL, PO Take 1 tablet by mouth daily in the afternoon.    [provider]    Family History Family History  Problem Relation Age of Onset   Hypertension Mother    Hyperlipidemia Mother    Glaucoma Father  Heart disease Father    Hypertension Father    Diabetes Father    Hyperlipidemia Father    Skin cancer Father    Diabetes Paternal Grandmother    Colon cancer Neg Hx    Esophageal cancer Neg Hx     Social History Social History   Tobacco Use   Smoking status: Never   Smokeless tobacco: Never  Vaping Use   Vaping Use: Never used  Substance Use Topics   Alcohol use: Yes    Comment: Rare   Drug use: No     Allergies   Latex and Sulfa antibiotics   Review of Systems Review of Systems  Eyes:  Positive for redness and itching.  All other systems reviewed and are negative.   Physical Exam Triage Vital Signs ED Triage Vitals  Enc Vitals Group     BP 11/03/21 1540 138/71     Pulse Rate 11/03/21 1540 83     Resp 11/03/21 1540 17     Temp 11/03/21 1540 98 F (36.7 C)     Temp Source 11/03/21 1540 Oral     SpO2 11/03/21 1540 95 %     Weight --      Height --      Head Circumference --      Peak Flow --      Pain Score 11/03/21 1542 0     Pain Loc --      Pain Edu? --      Excl. in [place]? --    No data  found.  Updated Vital Signs BP 138/71 (BP Location: Right Arm)   Pulse 83   Temp 98 F (36.7 C) (Oral)   Resp 17   SpO2 95%     Physical Exam Vitals and nursing note reviewed.  Constitutional:      Appearance: Normal appearance. She is normal weight.  HENT:     Head: Normocephalic and atraumatic.     Mouth/Throat:     Mouth: Mucous membranes are moist.     Pharynx: Oropharynx is clear.     Comments: Moderate amount of clear drainage of posterior oropharynx noted Eyes:     Extraocular Movements: Extraocular movements intact.     Pupils: Pupils are equal, round, and reactive to light.     Comments: Bilateral conjunctiva with +2 injection, mucopurulent discharge noticed at inner canthus bilaterally, patient reports pruritic in nature.  Cardiovascular:     Rate and Rhythm: Normal rate and regular rhythm.     Pulses: Normal pulses.     Heart sounds: Normal heart sounds.  Pulmonary:     Effort: Pulmonary effort is normal.     Breath sounds: Normal breath sounds.  Musculoskeletal:        General: Normal range of motion.     Cervical back: Normal range of motion and neck supple.  Skin:    General: Skin is warm and dry.  Neurological:     General: No focal deficit present.     Mental Status: She is alert and oriented to person, place, and time. Mental status is at baseline.     UC Treatments / Results  Labs (all labs ordered are listed, but only abnormal results are displayed) Labs Reviewed - No data to display  EKG   Radiology No results found.  Procedures Procedures (including critical care time)  Medications Ordered in UC Medications - No data to display  Initial Impression / Assessment and Plan / UC Course  I have  reviewed the triage vital signs and the nursing notes.  Pertinent labs & imaging results that were available during my care of the patient were reviewed by me and considered in my medical decision making (see chart for details).     MDM: 1.   Conjunctivitis of both eyes-Rx'd Vigamox; 2.  Allergic rhinitis-Rx'd Allegra. Advised patient to take medication as directed.  Advised patient hold current Flonase and take Allegra for the next 5 days for concurrent postnasal drainage/drip.  Advised patient may use Allegra as needed afterwards for postnasal drainage/drip.  Encouraged patient to increase daily water intake while taking these medications. Final Clinical Impressions(s) / UC Diagnoses   Final diagnoses:  Conjunctivitis of both eyes, unspecified conjunctivitis type  Allergic rhinitis, unspecified seasonality, unspecified trigger     Discharge Instructions      Advised patient to take medication as directed.  Advised patient hold current Flonase and take Allegra for the next 5 days for concurrent postnasal drainage/drip.  Advised patient may use Allegra as needed afterwards for postnasal drainage/drip.  Encouraged patient to increase daily water intake while taking these medications.     ED Prescriptions     Medication Sig Dispense Auth. Provider   moxifloxacin (VIGAMOX) 0.5 % ophthalmic solution Place 1 drop into both eyes 3 (three) times daily for 5 days. 3 mL Eliezer Lofts, FNP   fexofenadine [facility] ALLERGY) 180 MG tablet Take 1 tablet (180 mg total) by mouth daily for 15 days. 15 tablet Eliezer Lofts, FNP      PDMP not reviewed this encounter.   Eliezer Lofts, [place] 11/03/21 1620

## 2021-11-03 NOTE — Telephone Encounter (Signed)
Needs virtual  

## 2021-11-06 ENCOUNTER — Other Ambulatory Visit: Payer: Self-pay

## 2021-11-06 ENCOUNTER — Ambulatory Visit (INDEPENDENT_AMBULATORY_CARE_PROVIDER_SITE_OTHER): Payer: PPO | Admitting: Sports Medicine

## 2021-11-06 DIAGNOSIS — H6692 Otitis media, unspecified, left ear: Secondary | ICD-10-CM | POA: Insufficient documentation

## 2021-11-06 DIAGNOSIS — M1811 Unilateral primary osteoarthritis of first carpometacarpal joint, right hand: Secondary | ICD-10-CM | POA: Diagnosis not present

## 2021-11-06 DIAGNOSIS — H66002 Acute suppurative otitis media without spontaneous rupture of ear drum, left ear: Secondary | ICD-10-CM

## 2021-11-06 MED ORDER — AMOXICILLIN-POT CLAVULANATE 875-125 MG PO TABS: 1.0000 | ORAL_TABLET | Freq: Two times a day (BID) | ORAL | 0 refills | Status: DC

## 2021-11-06 NOTE — Assessment & Plan Note (Signed)
Tara Campbell has also been complaining of left ear pain for several days now, no drainage from the ear, no major changes in hearing, no trauma. On exam she does have a fairly erythematous, bulging left tympanic membrane, adding Augmentin. Return to see me as needed.

## 2021-11-06 NOTE — Progress Notes (Signed)
    Procedures performed today:    None.  Independent interpretation of notes and tests performed by another provider:   None.  Brief History, Exam, Impression, and Recommendations:    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 [name] is on a NOAC so we were unable to use NSAIDs, she did respond well to arthritis strength Tylenol, topical Voltaren, conditioning exercises. Return to see me as needed.  Otitis media, left [name] has also been complaining of left ear pain for several days now, no drainage from the ear, no major changes in hearing, no trauma. On exam she does have a fairly erythematous, bulging left tympanic membrane, adding Augmentin. Return to see me as needed.    ___________________________________________ Ihor Austin. Benjamin Stain, M.D., ABFM., CAQSM. Primary Care and Sports Medicine [facility]  Adjunct Instructor of Family Medicine  [employer]

## 2021-11-06 NOTE — Assessment & Plan Note (Signed)
Tara Campbell is on a NOAC so we were unable to use NSAIDs, she did respond well to arthritis strength Tylenol, topical Voltaren, conditioning exercises. Return to see me as needed.

## 2021-11-06 NOTE — Chronic Care Management (AMB) (Signed)
  Care Management   Note  11/06/2021 Name: LOVETA DELLIS MRN: [id number] DOB: 11/01/1946  ZULEIKA GALLUS is a 75 y.o. year old female who is a primary care patient of Agapito Games, MD and is actively engaged with the care management team. I reached out to Oneida Arenas by phone today to assist with re-scheduling a follow up visit with the Pharmacist  Follow up plan: Face to Face appointment with care management team member scheduled for:  11/15/2021  Burman Nieves, CCMA Care Guide, Embedded Care Coordination [facility]  Care Management  Direct Dial: [phone]

## 2021-11-07 ENCOUNTER — Other Ambulatory Visit (HOSPITAL_COMMUNITY): Payer: Self-pay | Admitting: Cardiology

## 2021-11-07 DIAGNOSIS — R531 Weakness: Secondary | ICD-10-CM

## 2021-11-10 ENCOUNTER — Encounter: Payer: Self-pay | Admitting: Family Medicine

## 2021-11-10 ENCOUNTER — Other Ambulatory Visit: Payer: Self-pay

## 2021-11-10 ENCOUNTER — Ambulatory Visit (INDEPENDENT_AMBULATORY_CARE_PROVIDER_SITE_OTHER): Payer: PPO | Admitting: Family Medicine

## 2021-11-10 VITALS — BP 140/70 | HR 88 | Resp 16 | Ht 62.5 in | Wt 188.0 lb

## 2021-11-10 DIAGNOSIS — R059 Cough, unspecified: Secondary | ICD-10-CM | POA: Diagnosis not present

## 2021-11-10 DIAGNOSIS — H9203 Otalgia, bilateral: Secondary | ICD-10-CM

## 2021-11-10 DIAGNOSIS — H918X2 Other specified hearing loss, left ear: Secondary | ICD-10-CM | POA: Diagnosis not present

## 2021-11-10 LAB — POCT INFLUENZA A/B
Influenza A, POC: NEGATIVE
Influenza B, POC: NEGATIVE

## 2021-11-10 MED ORDER — PREDNISONE 20 MG PO TABS: 40.0000 mg | ORAL_TABLET | Freq: Every day | ORAL | 0 refills | Status: DC

## 2021-11-10 NOTE — Progress Notes (Signed)
Acute Office Visit  Subjective:    Patient ID: Tara Campbell, female    DOB: 13-Jul-1946, 75 y.o.   MRN: [id number]  Chief Complaint  Patient presents with   Ear Pain    Follow up. Left ear/slight improvement with Augmentin. Patient is now having pain in right ear, 1 day. Covid test negative this week     HPI Patient is in today for left ear pain.  Now having pain in her right ear and slight cough.  Slight improvement with Augmentin.  Negative COVID test earlier this week.  She would like to be tested for the flu as well.  Past Medical History:  Diagnosis Date   Allergy    Arthritis    Asthma    Colon polyps    Coronary artery disease    Diabetes mellitus without complication ([place])    Gout    Heart attack ([place]) 07/30/2016   PCI, 2 stents   Hyperlipidemia    Hypertension    Internal hemorrhoids    Left rotator cuff tear    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   Otomycosis of right ear 09/27/2017   Paroxysmal atrial fibrillation ([place])    Thyroid disease     Past Surgical History:  Procedure Laterality Date   ANGIOPLASTY     CARDIAC CATHETERIZATION     COLONOSCOPY W/ POLYPECTOMY  01/04/2015   HEMORRHOID BANDING     LEFT HEART CATH AND CORONARY ANGIOGRAPHY N/A 04/26/2021   Procedure: LEFT HEART CATH AND CORONARY ANGIOGRAPHY;  Surgeon: Sherren Mocha, MD;  Location: [facility];  Service: Cardiovascular;  Laterality: N/A;    Family History  Problem Relation Age of Onset   Hypertension Mother    Hyperlipidemia Mother    Glaucoma Father    Heart disease Father    Hypertension Father    Diabetes Father    Hyperlipidemia Father    Skin cancer Father    Diabetes Paternal Grandmother    Colon cancer Neg Hx    Esophageal cancer Neg Hx     Social History   Socioeconomic History   Marital status: Divorced    Spouse name: Not on file   Number of children: 2   Years of education: 13.5   Highest education level: 12th grade  Occupational History    Comment:  retired  Tobacco Use   Smoking status: Never   Smokeless tobacco: Never  Vaping Use   Vaping Use: Never used  Substance and Sexual Activity   Alcohol use: Yes    Comment: Rare   Drug use: No   Sexual activity: Not Currently  Other Topics Concern   Not on file  Social History Narrative   Lives alone. Likes to crafts and paint in her free time.   Social Determinants of Health   Financial Resource Strain: Low Risk    Difficulty of Paying Living Expenses: Not hard at all  Food Insecurity: No Food Insecurity   Worried About Charity fundraiser in the Last Year: Never true   [place] in the Last Year: Never true  Transportation Needs: No Transportation Needs   Lack of Transportation (Medical): No   Lack of Transportation (Non-Medical): No  Physical Activity: Inactive   Days of Exercise per Week: 0 days   Minutes of Exercise per Session: 0 min  Stress: No Stress Concern Present   Feeling of Stress : Not at all  Social Connections: Moderately Isolated   Frequency of Communication with  Friends and Family: More than three times a week   Frequency of Social Gatherings with Friends and Family: More than three times a week   Attends Religious Services: Never   Marine scientist or Organizations: Yes   Attends Music therapist: More than 4 times per year   Marital Status: Divorced  Human resources officer Violence: Not At Risk   Fear of Current or Ex-Partner: No   Emotionally Abused: No   Physically Abused: No   Sexually Abused: No    Outpatient Medications Prior to Visit  Medication Sig Dispense Refill   acetaminophen (TYLENOL) 650 MG CR tablet Take 1 tablet (650 mg total) by mouth every 8 (eight) hours as needed for pain. 90 tablet 3   albuterol (VENTOLIN HFA) 108 (90 Base) MCG/ACT inhaler Inhale 2 puffs into the lungs every 6 (six) hours as needed for wheezing. 18 g prn   allopurinol (ZYLOPRIM) 300 MG tablet Take 1 tablet (300 mg total) by mouth 2 (two) times  daily. 180 tablet 1   amoxicillin-clavulanate (AUGMENTIN) 875-125 MG tablet Take 1 tablet by mouth 2 (two) times daily for 10 days. 20 tablet 0   atenolol (TENORMIN) 100 MG tablet Take 1 tablet (100 mg total) by mouth daily. 90 tablet 1   atorvastatin (LIPITOR) 80 MG tablet Take 1 tablet (80 mg total) by mouth at bedtime. 90 tablet 3   blood glucose meter kit and supplies KIT Check morning fasting blood glucose daily and up to 4 times daily as directed 1 each 0   budesonide-formoterol (SYMBICORT) 80-4.5 MCG/ACT inhaler Inhale 2 puffs into the lungs in the morning and at bedtime. 10.2 g 5   Cyanocobalamin (B-12 PO) Take 1,000 mcg by mouth daily.     empagliflozin (JARDIANCE) 10 MG TABS tablet Take 1 tablet (10 mg total) by mouth daily before breakfast. 30 tablet 11   EPINEPHrine 0.3 mg/0.3 mL IJ SOAJ injection Inject 0.3 mg into the muscle as needed for anaphylaxis. Call 9-1-1 after use. 1 each 2   Evolocumab with Infusor ([place]) 420 MG/3.5ML SOCT Inject 420 mg into the skin every 30 (thirty) days. 3.6 mL 11   ezetimibe (ZETIA) 10 MG tablet TAKE ONE TABLET BY MOUTH EVERY DAY 90 tablet 0   fexofenadine (ALLEGRA ALLERGY) 180 MG tablet Take 1 tablet (180 mg total) by mouth daily for 15 days. 15 tablet 0   fluticasone (FLONASE) 50 MCG/ACT nasal spray Place 1 spray into both nostrils daily. 16 g 3   glucose blood test strip To be used twice daily for testing blood sugars. E11.65 200 each 11   ipratropium-albuterol (DUONEB) 0.5-2.5 (3) MG/3ML SOLN Take 3 mLs by nebulization every 6 (six) hours as needed. 3 mL PRN   irbesartan-hydrochlorothiazide (AVALIDE) 300-12.5 MG tablet Take 0.5 tablets by mouth daily. 45 tablet 1   isosorbide mononitrate (IMDUR) 60 MG 24 hr tablet Take 1 tablet (60 mg total) by mouth daily. 90 tablet 1   levothyroxine (SYNTHROID) 112 MCG tablet Take 1 tablet (112 mcg total) by mouth daily before breakfast. 90 tablet 1   Mepolizumab ([name]) 100 MG/ML SOAJ Inject 1  mL (100 mg total) into the skin every 28 (twenty-eight) days. 1 mL 5   metFORMIN (GLUCOPHAGE) 500 MG tablet TAKE TWO TABLETS BY MOUTH TWICE DAILY WITH A MEAL 360 tablet 1   nitroGLYCERIN (NITROSTAT) 0.4 MG SL tablet Place 1 tablet (0.4 mg total) under the tongue every 5 (five) minutes as needed for chest pain (  or tightness). 30 tablet prn   Omega-3 Fatty Acids (FISH OIL PO) Take 1,000 Units by mouth daily.     Semaglutide, 1 MG/DOSE, 4 MG/3ML SOPN Inject 1 mg as directed once a week. 3 mL 3   VITAMIN D, CHOLECALCIFEROL, PO Take 1 tablet by mouth daily in the afternoon.     No facility-administered medications prior to visit.    Allergies  Allergen Reactions   Latex Hives   Sulfa Antibiotics Rash    Review of Systems     Objective:    Physical Exam Vitals and nursing note reviewed.  Constitutional:      Appearance: She is well-developed.  HENT:     Head: Normocephalic and atraumatic.     Right Ear: Tympanic membrane, ear canal and external ear normal.     Left Ear: Tympanic membrane, ear canal and external ear normal.     Ears:     Comments: Is a green spot on the left TM that is pretty small.    Nose: Nose normal.  Eyes:     Conjunctiva/sclera: Conjunctivae normal.     Pupils: Pupils are equal, round, and reactive to light.  Neck:     Thyroid: No thyromegaly.  Cardiovascular:     Rate and Rhythm: Normal rate and regular rhythm.     Heart sounds: Normal heart sounds.  Pulmonary:     Effort: Pulmonary effort is normal.     Breath sounds: Normal breath sounds. No wheezing.  Musculoskeletal:     Cervical back: Neck supple.  Lymphadenopathy:     Cervical: No cervical adenopathy.  Skin:    General: Skin is warm and dry.  Neurological:     Mental Status: She is alert and oriented to person, place, and time.  Psychiatric:        Behavior: Behavior normal.    BP 140/70   Pulse 88   Resp 16   Ht 5' 2.5" (1.588 m)   Wt 188 lb (85.3 kg)   SpO2 98%   BMI 33.84 kg/m   Wt Readings from Last 3 Encounters:  11/10/21 188 lb (85.3 kg)  10/31/21 186 lb 3.2 oz (84.5 kg)  10/09/21 185 lb (83.9 kg)    There are no preventive care reminders to display for this patient.  There are no preventive care reminders to display for this patient.   Lab Results  Component Value Date   TSH 2.65 04/24/2021   Lab Results  Component Value Date   WBC 7.2 04/27/2021   HGB 10.9 (L) 04/27/2021   HCT 32.4 (L) 04/27/2021   MCV 87.6 04/27/2021   PLT 265 04/27/2021   Lab Results  Component Value Date   NA 140 10/09/2021   K 4.6 10/09/2021   CO2 28 10/09/2021   GLUCOSE 98 10/09/2021   BUN 14 10/09/2021   CREATININE 0.85 10/09/2021   BILITOT 0.4 09/04/2021   ALKPHOS 100 05/29/2017   AST 11 09/04/2021   ALT 12 09/04/2021   PROT 6.4 09/04/2021   ALBUMIN 4.2 05/29/2017   CALCIUM 10.6 (H) 10/09/2021   ANIONGAP 9 04/27/2021   EGFR 71 10/09/2021   Lab Results  Component Value Date   CHOL 140 09/04/2021   Lab Results  Component Value Date   HDL 44 (L) 09/04/2021   Lab Results  Component Value Date   LDLCALC 68 09/04/2021   Lab Results  Component Value Date   TRIG 222 (H) 09/04/2021   Lab Results  Component Value Date  CHOLHDL 3.2 09/04/2021   Lab Results  Component Value Date   HGBA1C 6.5 (A) 09/06/2021       Assessment & Plan:   Problem List Items Addressed This Visit   None Visit Diagnoses     Cough, unspecified type    -  Primary   Relevant Orders   POCT Influenza A/B (Completed)   Other specified hearing loss of left ear, unspecified hearing status on contralateral side       Acute ear pain, bilateral          Bilateral ear pain-her left ear actually looks much better I do not see any sign of acute infection though she does have a little green spot on the ear.  It a most looks like something is stuck to the eardrum.  But the TM itself is clear no erythema.   Left ear hearing loss-I am concerned that that she is also having hearing  loss in the left ear some get a treat her with a round of prednisone if she is not feeling better after the weekend and encouraged her to give me a call back and we will refer her to ENT she already has an ENT with whom she is established.  Cough-it sounds like it is probably from postnasal drip but we did do a flu swab which was negative.  Make sure to complete Augmentin.  Meds ordered this encounter  Medications   predniSONE (DELTASONE) 20 MG tablet    Sig: Take 2 tablets (40 mg total) by mouth daily with breakfast.    Dispense:  10 tablet    Refill:  0     Catherine Metheney, MD  

## 2021-11-15 ENCOUNTER — Encounter: Payer: Self-pay | Admitting: Pharmacist

## 2021-11-15 ENCOUNTER — Other Ambulatory Visit: Payer: Self-pay

## 2021-11-15 ENCOUNTER — Other Ambulatory Visit: Payer: Self-pay | Admitting: Family Medicine

## 2021-11-15 ENCOUNTER — Ambulatory Visit (INDEPENDENT_AMBULATORY_CARE_PROVIDER_SITE_OTHER): Payer: PPO | Admitting: Pharmacist

## 2021-11-15 DIAGNOSIS — E1169 Type 2 diabetes mellitus with other specified complication: Secondary | ICD-10-CM

## 2021-11-15 DIAGNOSIS — I480 Paroxysmal atrial fibrillation: Secondary | ICD-10-CM

## 2021-11-15 DIAGNOSIS — I10 Essential (primary) hypertension: Secondary | ICD-10-CM

## 2021-11-15 DIAGNOSIS — E118 Type 2 diabetes mellitus with unspecified complications: Secondary | ICD-10-CM

## 2021-11-15 NOTE — Progress Notes (Signed)
Chronic Care Management Pharmacy Note  11/15/2021 Name:  Tara Campbell MRN:  [id number] DOB:  12-May-1946  Summary: addressed HTN, HLD, DM, Afib. Patient is requesting all RX sent to mail order pharmacy to start obtaining her medicines this way instead of [facility]. Patient assistance paperwork/eligibility updates: - Repatha approved - she will call company to obtain first shipment of medication supply  - Eliquis denied her - she can reapply next year if desired, provided guidance for her to call her insurance for a statement of [place] - eligible by income threshold, provided her with paperwork at today's visit   Recommendations/Changes made from today's visit:  - Patient requesting all RX sent to mail order to switch over to this process - no medication changes, provided support for ozempic to be covered for 2023 year. Printed application for patient to fill out and return  Plan: f/u with pharmacist in 1 month  Subjective: Tara Campbell is an 75 y.o. year old female who is a primary patient of Metheney, Rene Kocher, MD.  The CCM team was consulted for assistance with disease management and care coordination needs.    Engaged with patient face to face for follow up visit in response to provider referral for pharmacy case management and/or care coordination services.   Consent to Services:  The patient was given information about Chronic Care Management services, agreed to services, and gave verbal consent prior to initiation of services.  Please see initial visit note for detailed documentation.   Patient Care Team: Hali Marry, MD as PCP - General (Family Medicine) Juanito Doom, MD as Consulting Physician (Pulmonary Disease) Lelon Perla, MD as Consulting Physician (Cardiology) Darius Bump, [facility] as Pharmacist (Pharmacist)   Objective:  Lab Results  Component Value Date   CREATININE 0.85 10/09/2021    CREATININE 1.11 (H) 04/27/2021   CREATININE 1.05 (H) 04/26/2021    Lab Results  Component Value Date   HGBA1C 6.5 (A) 09/06/2021   Last diabetic Eye exam:  Lab Results  Component Value Date/Time   HMDIABEYEEXA No Retinopathy 04/05/2021 12:00 AM    Last diabetic Foot exam: No results found for: HMDIABFOOTEX      Component Value Date/Time   CHOL 140 09/04/2021 0958   TRIG 222 (H) 09/04/2021 0958   HDL 44 (L) 09/04/2021 0958   CHOLHDL 3.2 09/04/2021 0958   VLDL 50 (H) 04/26/2021 0449   LDLCALC 68 09/04/2021 0958    Hepatic Function Latest Ref Rng & Units 09/04/2021 04/24/2021 03/22/2021  Total Protein 6.1 - 8.1 g/dL 6.4 7.1 7.0  Albumin 3.6 - 5.1 g/dL - - -  AST 10 - 35 U/L _0 ALT 6 - 29 U/L _1 Alk Phosphatase 33 - 130 U/L - - -  Total Bilirubin 0.2 - 1.2 mg/dL 0.4 0.4 0.5  Bilirubin, Direct 0.0 - 0.2 mg/dL 0.1 - -    Lab Results  Component Value Date/Time   TSH 2.65 04/24/2021 12:00 AM   TSH 3.15 03/22/2021 12:00 AM    CBC Latest Ref Rng & Units 04/27/2021 04/26/2021 04/25/2021  WBC 4.0 - 10.5 K/uL 7.2 9.0 8.8  Hemoglobin 12.0 - 15.0 g/dL 10.9(L) 12.4 12.6  Hematocrit 36.0 - 46.0 % 32.4(L) 37.2 37.5  Platelets 150 - 400 K/uL 265 279 305    Social History   Tobacco Use  Smoking Status Never  Smokeless Tobacco Never   BP Readings from Last 3 Encounters:  11/10/21 140/70  11/03/21 138/71  10/31/21 (!) 148/72   Pulse Readings from Last 3 Encounters:  11/10/21 88  11/03/21 83  10/31/21 88   Wt Readings from Last 3 Encounters:  11/10/21 188 lb (85.3 kg)  10/31/21 186 lb 3.2 oz (84.5 kg)  10/09/21 185 lb (83.9 kg)    Assessment: Review of patient past medical history, allergies, medications, health status, including review of consultants reports, laboratory and other test data, was performed as part of comprehensive evaluation and provision of chronic care management services.   SDOH:  (Social Determinants of Health) assessments and interventions  performed:    CCM Care Plan  Allergies  Allergen Reactions   Latex Hives   Sulfa Antibiotics Rash    Medications Reviewed Today     Reviewed by Hali Marry, MD (Physician) on 11/10/21 at 1710  Med List Status: <None>   Medication Order Taking? Sig Documenting Provider Last Dose Status Informant  acetaminophen (TYLENOL) 650 MG CR tablet [id number] Yes Take 1 tablet (650 mg total) by mouth every 8 (eight) hours as needed for pain. Silverio Decamp, MD Taking Active   albuterol (VENTOLIN HFA) 108 (90 Base) MCG/ACT inhaler [id number] Yes Inhale 2 puffs into the lungs every 6 (six) hours as needed for wheezing. Hali Marry, MD Taking Active Self  allopurinol (ZYLOPRIM) 300 MG tablet [id number] Yes Take 1 tablet (300 mg total) by mouth 2 (two) times daily. Hali Marry, MD Taking Active   amoxicillin-clavulanate (AUGMENTIN) 875-125 MG tablet [id number] Yes Take 1 tablet by mouth 2 (two) times daily for 10 days. Silverio Decamp, MD Taking Active   atenolol (TENORMIN) 100 MG tablet [id number] Yes Take 1 tablet (100 mg total) by mouth daily. Hali Marry, MD Taking Active   atorvastatin (LIPITOR) 80 MG tablet [id number] Yes Take 1 tablet (80 mg total) by mouth at bedtime. Hali Marry, MD Taking Active Self  blood glucose meter kit and supplies KIT [id number] Yes Check morning fasting blood glucose daily and up to 4 times daily as directed [name], PA-C Taking Active Self  budesonide-formoterol [facility]) 80-4.5 MCG/ACT inhaler [id number] Yes Inhale 2 puffs into the lungs in the morning and at bedtime. Marshell Garfinkel, MD Taking Active   Cyanocobalamin (B-12 PO) [id number] Yes Take 1,000 mcg by mouth daily. [provider] Taking Active Self  empagliflozin (JARDIANCE) 10 MG TABS tablet [id number] Yes Take 1 tablet (10 mg total) by mouth daily before breakfast. Larey Dresser, MD Taking Active   EPINEPHrine 0.3 mg/0.3  mL IJ SOAJ injection [id number] Yes Inject 0.3 mg into the muscle as needed for anaphylaxis. Call 9-1-1 after use. Marshell Garfinkel, MD Taking Active   Evolocumab with Infusor ([place]) 420 MG/3.5ML SOCT [id number] Yes Inject 420 mg into the skin every 30 (thirty) days. Lelon Perla, MD Taking Active Self  ezetimibe (ZETIA) 10 MG tablet [id number] Yes TAKE ONE TABLET BY MOUTH EVERY DAY [facility] Breed Denice Bors, MD Taking Active Self  fexofenadine [facility] ALLERGY) 180 MG tablet [id number] Yes Take 1 tablet (180 mg total) by mouth daily for 15 days. Eliezer Lofts, FNP Taking Active   fluticasone (FLONASE) 50 MCG/ACT nasal spray [id number] Yes Place 1 spray into both nostrils daily. [name], [place] Taking Active Self  glucose blood test strip [id number] Yes To be used twice daily for testing blood sugars. E11.65 Hali Marry, MD Taking Active Self  ipratropium-albuterol (DUONEB) 0.5-2.5 (3) MG/3ML SOLN [id number] Yes Take 3 mLs  by nebulization every 6 (six) hours as needed. Agapito Games, MD Taking Active Self  irbesartan-hydrochlorothiazide (AVALIDE) 300-12.5 MG tablet [id number] Yes Take 0.5 tablets by mouth daily. Agapito Games, MD Taking Active   isosorbide mononitrate (IMDUR) 60 MG 24 hr tablet [id number] Yes Take 1 tablet (60 mg total) by mouth daily. Lewayne Bunting, MD Taking Active Self  levothyroxine (SYNTHROID) 112 MCG tablet [id number] Yes Take 1 tablet (112 mcg total) by mouth daily before breakfast. Agapito Games, MD Taking Active   Mepolizumab ([name]) 100 MG/ML [place] [id number] Yes Inject 1 mL (100 mg total) into the skin every 28 (twenty-eight) days. Chilton Greathouse, MD Taking Active Self  metFORMIN (GLUCOPHAGE) 500 MG tablet [id number] Yes TAKE TWO TABLETS BY MOUTH TWICE DAILY WITH A MEAL Metheney, Barbarann Ehlers, MD Taking Active   nitroGLYCERIN (NITROSTAT) 0.4 MG SL tablet [id number] Yes Place 1 tablet (0.4 mg total) under the tongue  every 5 (five) minutes as needed for chest pain (or tightness). Agapito Games, MD Taking Active   Omega-3 Fatty Acids (FISH OIL PO) [id number] Yes Take 1,000 Units by mouth daily. [provider] Taking Active Self  predniSONE (DELTASONE) 20 MG tablet [id number] Yes Take 2 tablets (40 mg total) by mouth daily with breakfast. Agapito Games, MD  Active   Semaglutide, 1 MG/DOSE, 4 MG/3ML SOPN [id number] Yes Inject 1 mg as directed once a week. Agapito Games, MD Taking Active   VITAMIN D, CHOLECALCIFEROL, PO [id number] Yes Take 1 tablet by mouth daily in the afternoon. 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 10/02/2021   BMI 33.0-33.9,adult 09/06/2021   Angina pectoris (HCC) 06/23/2021   Hyperlipidemia 04/27/2021   History of non-ST elevation myocardial infarction (NSTEMI) 04/25/2021   Hilar adenopathy 10/13/2018   Mediastinal adenopathy 10/13/2018   Dupuytren's contracture of left hand 08/28/2018   Trigger finger, left index finger 08/28/2018   Non-seasonal allergic rhinitis 08/26/2018   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   Controlled diabetes mellitus type 2 with complications (HCC) 12/27/2017   Class 1 obesity due to excess calories with serious comorbidity in adult 12/27/2017   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   Encounter for long-term (current) use of medications 11/28/2017   Anticoagulant long-term use 10/21/2017   Sensorineural hearing loss (SNHL) of both ears 09/27/2017   Hypertension goal BP (blood pressure) < 140/90 08/29/2017   Coronary artery disease involving native coronary artery of native heart with angina pectoris (HCC) 08/29/2017   Hearing difficulty of both ears 05/29/2017   Trochanteric bursitis, right hip 05/29/2017   Gout of ankle  04/08/2017   History of myocardial infarct at age greater than 60 years 01/23/2017   Mixed diabetic hyperlipidemia associated with type 2 diabetes mellitus (HCC) 01/23/2017   Acquired hypothyroidism 01/23/2017   Severe persistent allergic asthma 01/16/2017   Paroxysmal atrial fibrillation (HCC) 01/16/2017   Cardiomegaly 01/16/2017    Immunization History  Administered Date(s) Administered   Fluad Quad(high Dose 65+) 09/22/2019, 09/06/2021   Influenza, High Dose Seasonal PF 08/29/2017, 09/07/2018, 08/13/2020   Moderna SARS-COV2 Booster Vaccination 10/03/2021   PFIZER(Purple Top)SARS-COV-2 Vaccination 12/13/2019, 01/04/2020, 09/03/2020, 03/24/2021   Pneumococcal Conjugate-13 11/16/2019   Pneumococcal Polysaccharide-23 08/29/2017   Tdap 05/29/2017   Zoster Recombinat (Shingrix) 02/28/2018, 06/12/2018  Conditions to be addressed/monitored: Atrial Fibrillation, HTN, HLD, and DMII  There are no care plans that you recently modified to display for this patient.   Medication Assistance:  Application for repatha confirmed! Coverage through 12/02/21. Application for Eliquis pending.  Patient's preferred pharmacy is:  [place], [place] - [address] [place] [address] Phone: [phone] Fax: [phone]  [facility]) - [place], [address] [place] [address] Phone: [phone] Fax: [phone]  Uses pill box? Yes Pt endorses 100% compliance  Follow Up:  Patient agrees to Care Plan and Follow-up.  Plan: Telephone follow up appointment with care management team member scheduled for:  1 month  Larinda Buttery, PharmD Clinical Pharmacist [facility] [phone]

## 2021-11-15 NOTE — Patient Instructions (Signed)
Visit Information  Thank you for taking time to visit with me today. Please don't hesitate to contact me if I can be of assistance to you before our next scheduled telephone appointment.  Following are the goals we discussed today:  Patient Goals/Self-Care Activities Over the next 30 days, patient will:  take medications as prescribed and collaborate with provider on medication access solutions  Follow Up Plan: Telephone appointment with care management team member scheduled for: 1 month  Please call the care guide team at [phone] if you need to cancel or reschedule your appointment.    Patient verbalizes understanding of instructions provided today and agrees to view in MyChart.   Tara Campbell

## 2021-11-16 ENCOUNTER — Other Ambulatory Visit: Payer: Self-pay | Admitting: Cardiology

## 2021-11-17 NOTE — Telephone Encounter (Signed)
Submitted Patient Assistance Application to [employer] [name] for [name] along with provider portion, PA and income documents. Will update patient when we receive a response.  Fax# [phone] Phone# [phone] 

## 2021-11-20 ENCOUNTER — Ambulatory Visit (HOSPITAL_COMMUNITY)
Admission: RE | Admit: 2021-11-20 | Discharge: 2021-11-20 | Disposition: A | Discharge status: Home / Self Care | Payer: PPO | Source: Ambulatory Visit | Attending: Cardiology | Admitting: Cardiology

## 2021-11-20 ENCOUNTER — Telehealth (HOSPITAL_COMMUNITY): Payer: Self-pay | Admitting: Surgery

## 2021-11-20 ENCOUNTER — Other Ambulatory Visit: Payer: Self-pay

## 2021-11-20 DIAGNOSIS — R531 Weakness: Secondary | ICD-10-CM

## 2021-11-20 NOTE — Telephone Encounter (Signed)
-----   Message from Laurey Morale, MD sent at 11/20/2021  2:01 PM EST ----- Normal

## 2021-11-20 NOTE — Telephone Encounter (Signed)
I called patient and reviewed results.  She was grateful for the call. °

## 2021-12-02 DIAGNOSIS — I10 Essential (primary) hypertension: Secondary | ICD-10-CM

## 2021-12-02 DIAGNOSIS — E1169 Type 2 diabetes mellitus with other specified complication: Secondary | ICD-10-CM | POA: Diagnosis not present

## 2021-12-02 DIAGNOSIS — I480 Paroxysmal atrial fibrillation: Secondary | ICD-10-CM | POA: Diagnosis not present

## 2021-12-02 DIAGNOSIS — E785 Hyperlipidemia, unspecified: Secondary | ICD-10-CM

## 2021-12-02 DIAGNOSIS — E118 Type 2 diabetes mellitus with unspecified complications: Secondary | ICD-10-CM | POA: Diagnosis not present

## 2021-12-07 ENCOUNTER — Encounter: Payer: Self-pay | Admitting: Family Medicine

## 2021-12-07 ENCOUNTER — Ambulatory Visit (INDEPENDENT_AMBULATORY_CARE_PROVIDER_SITE_OTHER): Payer: PPO | Admitting: Family Medicine

## 2021-12-07 ENCOUNTER — Other Ambulatory Visit: Payer: Self-pay

## 2021-12-07 VITALS — BP 104/49 | HR 74 | Wt 187.0 lb

## 2021-12-07 DIAGNOSIS — Z6833 Body mass index (BMI) 33.0-33.9, adult: Secondary | ICD-10-CM

## 2021-12-07 DIAGNOSIS — J4550 Severe persistent asthma, uncomplicated: Secondary | ICD-10-CM

## 2021-12-07 DIAGNOSIS — I480 Paroxysmal atrial fibrillation: Secondary | ICD-10-CM

## 2021-12-07 DIAGNOSIS — I10 Essential (primary) hypertension: Secondary | ICD-10-CM | POA: Diagnosis not present

## 2021-12-07 DIAGNOSIS — E1122 Type 2 diabetes mellitus with diabetic chronic kidney disease: Secondary | ICD-10-CM

## 2021-12-07 DIAGNOSIS — E039 Hypothyroidism, unspecified: Secondary | ICD-10-CM | POA: Diagnosis not present

## 2021-12-07 DIAGNOSIS — N1831 Chronic kidney disease, stage 3a: Secondary | ICD-10-CM | POA: Diagnosis not present

## 2021-12-07 DIAGNOSIS — E118 Type 2 diabetes mellitus with unspecified complications: Secondary | ICD-10-CM | POA: Diagnosis not present

## 2021-12-07 LAB — POCT GLYCOSYLATED HEMOGLOBIN (HGB A1C): Hemoglobin A1C: 6.5 % — AB (ref 4.0–5.6)

## 2021-12-07 MED ORDER — SEMAGLUTIDE (2 MG/DOSE) 8 MG/3ML ~~LOC~~ SOPN: 2.0000 mg | PEN_INJECTOR | SUBCUTANEOUS | 1 refills | Status: DC

## 2021-12-07 NOTE — Assessment & Plan Note (Signed)
Continue with Eliquis.  Unfortunately she was not able to qualify for any patient assistance that we can hopefully reapply this year again if needed.

## 2021-12-07 NOTE — Assessment & Plan Note (Signed)
Due to recheck TSH. 

## 2021-12-07 NOTE — Assessment & Plan Note (Signed)
She is down about 3 pounds since this summer.  Just encouraged her to continue to work on the healthy food choices and I think when she gets into a good exercise routine that will help as well.  Were also going to try to bump up the Ozempic to 2 mg and that may be a useful tool to help curb appetite.

## 2021-12-07 NOTE — Progress Notes (Signed)
Established Patient Office Visit  Subjective:  Patient ID: Tara Campbell, female    DOB: 1946/04/05  Age: 76 y.o. MRN: [id number]  CC:  Chief Complaint  Patient presents with   Diabetes    Follow up     HPI Tara Campbell presents for   Diabetes - no hypoglycemic events. No wounds or sores that are not healing well. No increased thirst or urination. Checking glucose at home. Taking medications as prescribed without any side effects.  She is doing well on the Ozempic without any side effects or problems.  Currently on 1 mg dose.  She was able to qualify for assistance with her Repatha at least through the summer which is fantastic.  She is noticed about a 50% reduction in her cholesterol numbers and being on the medication so its been incredibly helpful.  She and her sister have really been working on changing her diet and she has really done great over the holidays.  They are planning on joining the fitness center here in [place] to start exercising more consistently.  Hypothyroidism - Taking medication regularly in the AM away from food and vitamins, etc. No recent change to skin, hair, or energy levels.  She has been enjoying painting over FaceTime with a friend.   Past Medical History:  Diagnosis Date   Allergy    Arthritis    Asthma    Colon polyps    Coronary artery disease    Diabetes mellitus without complication ([facility])    Gout    Heart attack ([place]) 07/30/2016   PCI, 2 stents   Hyperlipidemia    Hypertension    Internal hemorrhoids    Left rotator cuff tear    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   Otomycosis of right ear 09/27/2017   Paroxysmal atrial fibrillation ([place])    Thyroid disease     Past Surgical History:  Procedure Laterality Date   ANGIOPLASTY     CARDIAC CATHETERIZATION     COLONOSCOPY W/ POLYPECTOMY  01/04/2015   HEMORRHOID BANDING     LEFT HEART CATH AND CORONARY ANGIOGRAPHY N/A 04/26/2021   Procedure: LEFT HEART CATH AND CORONARY  ANGIOGRAPHY;  Surgeon: Sherren Mocha, MD;  Location: [facility];  Service: Cardiovascular;  Laterality: N/A;    Family History  Problem Relation Age of Onset   Hypertension Mother    Hyperlipidemia Mother    Glaucoma Father    Heart disease Father    Hypertension Father    Diabetes Father    Hyperlipidemia Father    Skin cancer Father    Diabetes Paternal Grandmother    Colon cancer Neg Hx    Esophageal cancer Neg Hx     Social History   Socioeconomic History   Marital status: Divorced    Spouse name: Not on file   Number of children: 2   Years of education: 13.5   Highest education level: 12th grade  Occupational History    Comment: retired  Tobacco Use   Smoking status: Never   Smokeless tobacco: Never  Vaping Use   Vaping Use: Never used  Substance and Sexual Activity   Alcohol use: Yes    Comment: Rare   Drug use: No   Sexual activity: Not Currently  Other Topics Concern   Not on file  Social History Narrative   Lives alone. Likes to crafts and paint in her free time.   Social Determinants of Health   Financial Resource Strain: Low Risk  Difficulty of Paying Living Expenses: Not hard at all  Food Insecurity: No Food Insecurity   Worried About [place] in the Last Year: Never true   Ran Out of Food in the Last Year: Never true  Transportation Needs: No Transportation Needs   Lack of Transportation (Medical): No   Lack of Transportation (Non-Medical): No  Physical Activity: Inactive   Days of Exercise per Week: 0 days   Minutes of Exercise per Session: 0 min  Stress: No Stress Concern Present   Feeling of Stress : Not at all  Social Connections: Moderately Isolated   Frequency of Communication with Friends and Family: More than three times a week   Frequency of Social Gatherings with Friends and Family: More than three times a week   Attends Religious Services: Never   Marine scientist or Organizations: Yes   Attends Arts development officer: More than 4 times per year   Marital Status: Divorced  Human resources officer Violence: Not At Risk   Fear of Current or Ex-Partner: No   Emotionally Abused: No   Physically Abused: No   Sexually Abused: No    Outpatient Medications Prior to Visit  Medication Sig Dispense Refill   acetaminophen (TYLENOL) 650 MG CR tablet Take 1 tablet (650 mg total) by mouth every 8 (eight) hours as needed for pain. 90 tablet 3   albuterol (VENTOLIN HFA) 108 (90 Base) MCG/ACT inhaler Inhale 2 puffs into the lungs every 6 (six) hours as needed for wheezing. 18 g prn   allopurinol (ZYLOPRIM) 300 MG tablet Take 1 tablet (300 mg total) by mouth 2 (two) times daily. 180 tablet 1   apixaban (ELIQUIS) 5 MG TABS tablet Take 5 mg by mouth 2 (two) times daily.     atenolol (TENORMIN) 100 MG tablet Take 1 tablet (100 mg total) by mouth daily. 90 tablet 1   atorvastatin (LIPITOR) 80 MG tablet Take 1 tablet (80 mg total) by mouth at bedtime. 90 tablet 3   blood glucose meter kit and supplies KIT Check morning fasting blood glucose daily and up to 4 times daily as directed 1 each 0   budesonide-formoterol (SYMBICORT) 80-4.5 MCG/ACT inhaler Inhale 2 puffs into the lungs in the morning and at bedtime. 10.2 g 5   Cyanocobalamin (B-12 PO) Take 1,000 mcg by mouth daily.     empagliflozin (JARDIANCE) 10 MG TABS tablet Take 1 tablet (10 mg total) by mouth daily before breakfast. 30 tablet 11   EPINEPHrine 0.3 mg/0.3 mL IJ SOAJ injection Inject 0.3 mg into the muscle as needed for anaphylaxis. Call 9-1-1 after use. 1 each 2   Evolocumab with Infusor ([place]) 420 MG/3.5ML SOCT Inject 420 mg into the skin every 30 (thirty) days. 3.6 mL 11   ezetimibe (ZETIA) 10 MG tablet TAKE ONE TABLET BY MOUTH EVERY DAY 90 tablet 0   fluticasone (FLONASE) 50 MCG/ACT nasal spray Place 1 spray into both nostrils daily. 16 g 3   glucose blood test strip To be used twice daily for testing blood sugars. E11.65  200 each 11   ipratropium-albuterol (DUONEB) 0.5-2.5 (3) MG/3ML SOLN Take 3 mLs by nebulization every 6 (six) hours as needed. 3 mL PRN   irbesartan-hydrochlorothiazide (AVALIDE) 300-12.5 MG tablet Take 0.5 tablets by mouth daily. 45 tablet 1   isosorbide mononitrate (IMDUR) 60 MG 24 hr tablet Take 1 tablet (60 mg total) by mouth daily. 90 tablet 1   levothyroxine (SYNTHROID) 112 MCG tablet  Take 1 tablet (112 mcg total) by mouth daily before breakfast. 90 tablet 1   Mepolizumab ([name]) 100 MG/ML SOAJ Inject 1 mL (100 mg total) into the skin every 28 (twenty-eight) days. 1 mL 5   metFORMIN (GLUCOPHAGE) 500 MG tablet TAKE TWO TABLETS BY MOUTH TWICE DAILY WITH A MEAL 360 tablet 1   nitroGLYCERIN (NITROSTAT) 0.4 MG SL tablet Place 1 tablet (0.4 mg total) under the tongue every 5 (five) minutes as needed for chest pain (or tightness). 30 tablet prn   Omega-3 Fatty Acids (FISH OIL PO) Take 1,000 Units by mouth daily.     VITAMIN D, CHOLECALCIFEROL, PO Take 1 tablet by mouth daily in the afternoon.     Semaglutide, 1 MG/DOSE, 4 MG/3ML SOPN Inject 1 mg as directed once a week. 3 mL 3   No facility-administered medications prior to visit.    Allergies  Allergen Reactions   Latex Hives   Sulfa Antibiotics Rash    ROS Review of Systems    Objective:    Physical Exam Constitutional:      Appearance: Normal appearance. She is well-developed.  HENT:     Head: Normocephalic and atraumatic.  Cardiovascular:     Rate and Rhythm: Normal rate and regular rhythm.     Heart sounds: Normal heart sounds.  Pulmonary:     Effort: Pulmonary effort is normal.     Breath sounds: Normal breath sounds.  Skin:    General: Skin is warm and dry.  Neurological:     Mental Status: She is alert and oriented to person, place, and time.  Psychiatric:        Behavior: Behavior normal.    BP (!) 104/49    Pulse 74    Wt 187 lb (84.8 kg)    SpO2 97%    BMI 33.66 kg/m  Wt Readings from Last 3 Encounters:   12/07/21 187 lb (84.8 kg)  11/10/21 188 lb (85.3 kg)  10/31/21 186 lb 3.2 oz (84.5 kg)     There are no preventive care reminders to display for this patient.  There are no preventive care reminders to display for this patient.  Lab Results  Component Value Date   TSH 2.65 04/24/2021   Lab Results  Component Value Date   WBC 7.2 04/27/2021   HGB 10.9 (L) 04/27/2021   HCT 32.4 (L) 04/27/2021   MCV 87.6 04/27/2021   PLT 265 04/27/2021   Lab Results  Component Value Date   NA 140 10/09/2021   K 4.6 10/09/2021   CO2 28 10/09/2021   GLUCOSE 98 10/09/2021   BUN 14 10/09/2021   CREATININE 0.85 10/09/2021   BILITOT 0.4 09/04/2021   ALKPHOS 100 05/29/2017   AST 11 09/04/2021   ALT 12 09/04/2021   PROT 6.4 09/04/2021   ALBUMIN 4.2 05/29/2017   CALCIUM 10.6 (H) 10/09/2021   ANIONGAP 9 04/27/2021   EGFR 71 10/09/2021   Lab Results  Component Value Date   CHOL 140 09/04/2021   Lab Results  Component Value Date   HDL 44 (L) 09/04/2021   Lab Results  Component Value Date   LDLCALC 68 09/04/2021   Lab Results  Component Value Date   TRIG 222 (H) 09/04/2021   Lab Results  Component Value Date   CHOLHDL 3.2 09/04/2021   Lab Results  Component Value Date   HGBA1C 6.5 (A) 12/07/2021      Assessment & Plan:   Problem List Items Addressed This Visit  Cardiovascular and Mediastinum   Paroxysmal atrial fibrillation (HCC)    Continue with Eliquis.  Unfortunately she was not able to qualify for any patient assistance that we can hopefully reapply this year again if needed.      Hypertension goal BP (blood pressure) < 140/90    Blood pressure looks absolutely phenomenal in fact is a little bit on the low side she is not feeling dizzy or lightheaded which is reassuring.  She is also been consistently getting low blood pressures at home she does have a follow-up with cardiology in about 4 weeks so that they can address this.  She is actually already splitting  her irbesartan.        Respiratory   Severe persistent allergic asthma    She has been doing really well on the [name] so they are going to taper her Trelegy down to just [facility] which is fantastic.        Endocrine   Type 2 diabetes mellitus with diabetic chronic kidney disease (HCC)    A1c looks absolutely fantastic at 6.5.  I am going go ahead and increase her Ozempic to 2 mg with the hope of being able to take her off of the metformin and be able to control her blood sugars with just 1 medication.  We will continue to work to try to simplify her regimen.  Really did fantastic over the holidays and praised her for those changes.  Planning on starting to workout at the gym this month which would be fantastic.      Relevant Medications   Semaglutide, 2 MG/DOSE, 8 MG/3ML SOPN   Controlled diabetes mellitus type 2 with complications (HCC) - Primary   Relevant Medications   Semaglutide, 2 MG/DOSE, 8 MG/3ML SOPN   Other Relevant Orders   POCT HgB A1C (Completed)   Acquired hypothyroidism    Due to recheck TSH.      Relevant Orders   TSH     Other   BMI 33.0-33.9,adult    She is down about 3 pounds since this summer.  Just encouraged her to continue to work on the healthy food choices and I think when she gets into a good exercise routine that will help as well.  Were also going to try to bump up the Ozempic to 2 mg and that may be a useful tool to help curb appetite.       Meds ordered this encounter  Medications   Semaglutide, 2 MG/DOSE, 8 MG/3ML SOPN    Sig: Inject 2 mg as directed once a week.    Dispense:  9 mL    Refill:  1    Follow-up: Return in about 3 months (around 03/07/2022) for Diabetes follow-up, Hypertension.    Beatrice Lecher, MD

## 2021-12-07 NOTE — Assessment & Plan Note (Signed)
Blood pressure looks absolutely phenomenal in fact is a little bit on the low side she is not feeling dizzy or lightheaded which is reassuring.  She is also been consistently getting low blood pressures at home she does have a follow-up with cardiology in about 4 weeks so that they can address this.  She is actually already splitting her irbesartan.

## 2021-12-07 NOTE — Assessment & Plan Note (Signed)
A1c looks absolutely fantastic at 6.5.  I am going go ahead and increase her Ozempic to 2 mg with the hope of being able to take her off of the metformin and be able to control her blood sugars with just 1 medication.  We will continue to work to try to simplify her regimen.  Really did fantastic over the holidays and praised her for those changes.  Planning on starting to workout at the gym this month which would be fantastic.

## 2021-12-07 NOTE — Assessment & Plan Note (Signed)
She has been doing really well on the [name] so they are going to taper her Trelegy down to just [facility] which is fantastic.

## 2021-12-08 ENCOUNTER — Encounter: Payer: Self-pay | Admitting: Family Medicine

## 2021-12-08 DIAGNOSIS — E039 Hypothyroidism, unspecified: Secondary | ICD-10-CM

## 2021-12-08 LAB — TSH: TSH: 6.17 mIU/L — ABNORMAL HIGH (ref 0.40–4.50)

## 2021-12-08 MED ORDER — LEVOTHYROXINE SODIUM 125 MCG PO TABS: 125.0000 ug | ORAL_TABLET | Freq: Every day | ORAL | 0 refills | Status: DC

## 2021-12-08 NOTE — Progress Notes (Signed)
HI Echo,  Your thyroid is off.  Just wanted to verify how you are taking your medication.  112 mcg every day.  If you are taking it a little differently let me know or if you ran out or missed some doses recently then please let me know.  Otherwise I do need to make an adjustment, and then we will need to recheck her level in 6 weeks.

## 2021-12-13 NOTE — Telephone Encounter (Signed)
Per [facility] [name], patient's [name] PAP renewal application has been received and is being reviewed. Application is complete, but may take additional business days to process °  °Levester Waldridge, PharmD, MPH, BCPS °Clinical Pharmacist (Rheumatology and Pulmonology) °

## 2021-12-14 ENCOUNTER — Encounter: Payer: Self-pay | Admitting: Family Medicine

## 2021-12-14 DIAGNOSIS — E1169 Type 2 diabetes mellitus with other specified complication: Secondary | ICD-10-CM

## 2021-12-14 DIAGNOSIS — M1A0790 Idiopathic chronic gout, unspecified ankle and foot, without tophus (tophi): Secondary | ICD-10-CM

## 2021-12-15 ENCOUNTER — Other Ambulatory Visit: Payer: Self-pay | Admitting: Cardiology

## 2021-12-15 MED ORDER — EZETIMIBE 10 MG PO TABS: 10.0000 mg | ORAL_TABLET | Freq: Every day | ORAL | 3 refills | Status: DC

## 2021-12-15 MED ORDER — ALLOPURINOL 300 MG PO TABS: 300.0000 mg | ORAL_TABLET | Freq: Two times a day (BID) | ORAL | 1 refills | Status: DC

## 2021-12-15 MED ORDER — ATORVASTATIN CALCIUM 80 MG PO TABS: 80.0000 mg | ORAL_TABLET | Freq: Every day | ORAL | 3 refills | Status: DC

## 2021-12-15 MED ORDER — EMPAGLIFLOZIN 10 MG PO TABS: 10.0000 mg | ORAL_TABLET | Freq: Every day | ORAL | 1 refills | Status: DC

## 2021-12-15 MED ORDER — APIXABAN 5 MG PO TABS: 5.0000 mg | ORAL_TABLET | Freq: Two times a day (BID) | ORAL | 1 refills | Status: DC

## 2021-12-15 MED ORDER — ISOSORBIDE MONONITRATE ER 60 MG PO TB24: 60.0000 mg | ORAL_TABLET | Freq: Every day | ORAL | 1 refills | Status: DC

## 2021-12-15 NOTE — Telephone Encounter (Signed)
Received fax stating that the Medicare Beneficiary Identification number had been written incorrectly. Contacted [employer] and provided correct MBI to rep. Ensured that there was nothing else needed in regards to her application.

## 2021-12-18 MED ORDER — NUCALA 100 MG/ML ~~LOC~~ SOAJ: 100.0000 mg | SUBCUTANEOUS | 4 refills | Status: AC

## 2021-12-18 NOTE — Telephone Encounter (Signed)
Received a fax from  [place] to [place] regarding an approval for Tara Campbell patient assistance from 12/15/21 to 12/02/22.   Phone number: [phone]  Last seen by Dr. Vaughan Browner on 10/31/21. Refill sent to [facility] for Archer Asa, PharmD, MPH, BCPS Clinical Pharmacist (Rheumatology and Pulmonology)

## 2021-12-20 NOTE — Telephone Encounter (Signed)
Per rep at [facility] [name], PAP renewal application is still processing. ° °Breton Berns, PharmD, MPH, BCPS °Clinical Pharmacist (Rheumatology and Pulmonology) °

## 2021-12-26 ENCOUNTER — Observation Stay (HOSPITAL_BASED_OUTPATIENT_CLINIC_OR_DEPARTMENT_OTHER)
Admission: EM | Admit: 2021-12-26 | Discharge: 2021-12-27 | Disposition: A | Discharge status: Home / Self Care | Payer: PPO | Attending: Internal Medicine | Admitting: Internal Medicine

## 2021-12-26 ENCOUNTER — Telehealth: Payer: PPO

## 2021-12-26 ENCOUNTER — Other Ambulatory Visit: Payer: Self-pay

## 2021-12-26 ENCOUNTER — Encounter (HOSPITAL_BASED_OUTPATIENT_CLINIC_OR_DEPARTMENT_OTHER): Payer: Self-pay | Admitting: Urology

## 2021-12-26 ENCOUNTER — Emergency Department (HOSPITAL_BASED_OUTPATIENT_CLINIC_OR_DEPARTMENT_OTHER): Payer: PPO

## 2021-12-26 DIAGNOSIS — Z7984 Long term (current) use of oral hypoglycemic drugs: Secondary | ICD-10-CM | POA: Insufficient documentation

## 2021-12-26 DIAGNOSIS — I129 Hypertensive chronic kidney disease with stage 1 through stage 4 chronic kidney disease, or unspecified chronic kidney disease: Secondary | ICD-10-CM | POA: Diagnosis not present

## 2021-12-26 DIAGNOSIS — I2510 Atherosclerotic heart disease of native coronary artery without angina pectoris: Secondary | ICD-10-CM | POA: Insufficient documentation

## 2021-12-26 DIAGNOSIS — E039 Hypothyroidism, unspecified: Secondary | ICD-10-CM | POA: Insufficient documentation

## 2021-12-26 DIAGNOSIS — Z7901 Long term (current) use of anticoagulants: Secondary | ICD-10-CM | POA: Insufficient documentation

## 2021-12-26 DIAGNOSIS — G43109 Migraine with aura, not intractable, without status migrainosus: Secondary | ICD-10-CM | POA: Diagnosis not present

## 2021-12-26 DIAGNOSIS — R0789 Other chest pain: Secondary | ICD-10-CM | POA: Diagnosis not present

## 2021-12-26 DIAGNOSIS — R079 Chest pain, unspecified: Secondary | ICD-10-CM | POA: Diagnosis not present

## 2021-12-26 DIAGNOSIS — E6609 Other obesity due to excess calories: Secondary | ICD-10-CM | POA: Diagnosis present

## 2021-12-26 DIAGNOSIS — N189 Chronic kidney disease, unspecified: Secondary | ICD-10-CM | POA: Diagnosis not present

## 2021-12-26 DIAGNOSIS — Z79899 Other long term (current) drug therapy: Secondary | ICD-10-CM | POA: Diagnosis not present

## 2021-12-26 DIAGNOSIS — Z20822 Contact with and (suspected) exposure to covid-19: Secondary | ICD-10-CM | POA: Insufficient documentation

## 2021-12-26 DIAGNOSIS — R41841 Cognitive communication deficit: Secondary | ICD-10-CM | POA: Diagnosis not present

## 2021-12-26 DIAGNOSIS — I480 Paroxysmal atrial fibrillation: Secondary | ICD-10-CM | POA: Insufficient documentation

## 2021-12-26 DIAGNOSIS — J4550 Severe persistent asthma, uncomplicated: Secondary | ICD-10-CM | POA: Diagnosis not present

## 2021-12-26 DIAGNOSIS — Z91040 Latex allergy status: Secondary | ICD-10-CM | POA: Insufficient documentation

## 2021-12-26 DIAGNOSIS — E1122 Type 2 diabetes mellitus with diabetic chronic kidney disease: Secondary | ICD-10-CM | POA: Diagnosis not present

## 2021-12-26 DIAGNOSIS — G459 Transient cerebral ischemic attack, unspecified: Secondary | ICD-10-CM | POA: Diagnosis not present

## 2021-12-26 DIAGNOSIS — Z794 Long term (current) use of insulin: Secondary | ICD-10-CM | POA: Insufficient documentation

## 2021-12-26 DIAGNOSIS — I25119 Atherosclerotic heart disease of native coronary artery with unspecified angina pectoris: Secondary | ICD-10-CM | POA: Diagnosis present

## 2021-12-26 DIAGNOSIS — E785 Hyperlipidemia, unspecified: Secondary | ICD-10-CM | POA: Diagnosis present

## 2021-12-26 DIAGNOSIS — R200 Anesthesia of skin: Secondary | ICD-10-CM | POA: Diagnosis not present

## 2021-12-26 LAB — BASIC METABOLIC PANEL
Anion gap: 9 (ref 5–15)
BUN: 14 mg/dL (ref 8–23)
CO2: 24 mmol/L (ref 22–32)
Calcium: 10.1 mg/dL (ref 8.9–10.3)
Chloride: 104 mmol/L (ref 98–111)
Creatinine, Ser: 0.9 mg/dL (ref 0.44–1.00)
GFR, Estimated: >60 mL/min (ref >60)
Glucose, Bld: 113 mg/dL — ABNORMAL HIGH (ref 70–99)
Potassium: 3.9 mmol/L (ref 3.5–5.1)
Sodium: 137 mmol/L (ref 135–145)

## 2021-12-26 LAB — RESP PANEL BY RT-PCR (FLU A&B, COVID) ARPGX2
Influenza A by PCR: NEGATIVE
Influenza B by PCR: NEGATIVE
SARS Coronavirus 2 by RT PCR: NEGATIVE

## 2021-12-26 LAB — CBC
HCT: 35.8 % — ABNORMAL LOW (ref 36.0–46.0)
Hemoglobin: 12.2 g/dL (ref 12.0–15.0)
MCH: 30.5 pg (ref 26.0–34.0)
MCHC: 34.1 g/dL (ref 30.0–36.0)
MCV: 89.5 fL (ref 80.0–100.0)
Platelets: 315 10*3/uL (ref 150–400)
RBC: 4 MIL/uL (ref 3.87–5.11)
RDW: 14.5 % (ref 11.5–15.5)
WBC: 10 10*3/uL (ref 4.0–10.5)
nRBC: 0 % (ref 0.0–0.2)

## 2021-12-26 LAB — GLUCOSE, CAPILLARY: Glucose-Capillary: 101 mg/dL — ABNORMAL HIGH (ref 70–99)

## 2021-12-26 LAB — TROPONIN I (HIGH SENSITIVITY)
Troponin I (High Sensitivity): 4 ng/L (ref <18)
Troponin I (High Sensitivity): 3 ng/L (ref <18)

## 2021-12-26 MED ORDER — SODIUM CHLORIDE 0.9 % IV BOLUS
1000.0000 mL | Freq: Once | INTRAVENOUS | Status: AC
  Administered 2021-12-26: 15:00:00 1000 mL via INTRAVENOUS

## 2021-12-26 NOTE — ED Triage Notes (Signed)
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

## 2021-12-26 NOTE — ED Provider Notes (Signed)
[name] Provider Note   CSN: [id number] Arrival date & time: 12/26/21  1308     History  Chief Complaint  Patient presents with   Chest Pain   Arm numbness    Tara Campbell is a 76 y.o. female.  76 yo F with a chief complaints of an episode where she felt like her left arm went numb and her vision showed a geometric pattern.  This was off and on for a couple hours and then resolved just prior to coming in.  She had a couple episodes of left-sided sharp chest discomfort that have also resolved.  Has a history of an MI x2 but thinks that this feels different.  Denies cough congestion or fever.  Denies recent medication change.  Denies neck pain denies headache denies abdominal pain.  Has been eating and drinking normally.  The history is provided by the patient.  Chest Pain Associated symptoms: no dizziness, no fever, no headache, no nausea, no palpitations, no shortness of breath and no vomiting   Illness Severity:  Moderate Onset quality:  Gradual Duration:  2 hours Timing:  Intermittent Progression:  Waxing and waning Chronicity:  Recurrent Associated symptoms: chest pain   Associated symptoms: no congestion, no fever, no headaches, no myalgias, no nausea, no rhinorrhea, no shortness of breath, no vomiting and no wheezing       Home Medications Prior to Admission medications   Medication Sig Start Date End Date Taking? Authorizing Provider  acetaminophen (TYLENOL) 650 MG CR tablet Take 1 tablet (650 mg total) by mouth every 8 (eight) hours as needed for pain. 10/02/21   Silverio Decamp, MD  albuterol (VENTOLIN HFA) 108 (90 Base) MCG/ACT inhaler Inhale 2 puffs into the lungs every 6 (six) hours as needed for wheezing. 11/16/19   Hali Marry, MD  allopurinol (ZYLOPRIM) 300 MG tablet Take 1 tablet (300 mg total) by mouth 2 (two) times daily. 12/15/21   Hali Marry, MD  apixaban (ELIQUIS) 5 MG TABS tablet Take 1  tablet (5 mg total) by mouth 2 (two) times daily. 12/15/21   Hali Marry, MD  atenolol (TENORMIN) 100 MG tablet Take 1 tablet (100 mg total) by mouth daily. 09/06/21   Hali Marry, MD  atorvastatin (LIPITOR) 80 MG tablet Take 1 tablet (80 mg total) by mouth at bedtime. 12/15/21   Hali Marry, MD  blood glucose meter kit and supplies KIT Check morning fasting blood glucose daily and up to 4 times daily as directed 04/03/18   [name], PA-C  budesonide-formoterol [facility]) 80-4.5 MCG/ACT inhaler Inhale 2 puffs into the lungs in the morning and at bedtime. 10/31/21   Mannam, Hart Robinsons, MD  Cyanocobalamin (B-12 PO) Take 1,000 mcg by mouth daily.    [provider]  empagliflozin (JARDIANCE) 10 MG TABS tablet Take 1 tablet (10 mg total) by mouth daily before breakfast. 12/15/21   Hali Marry, MD  EPINEPHrine 0.3 mg/0.3 mL IJ SOAJ injection Inject 0.3 mg into the muscle as needed for anaphylaxis. Call 9-1-1 after use. 03/06/21   Mannam, Hart Robinsons, MD  Evolocumab with Infusor ([place]) 420 MG/3.5ML SOCT Inject 420 mg into the skin every 30 (thirty) days. 05/29/21   Lelon Perla, MD  ezetimibe (ZETIA) 10 MG tablet Take 1 tablet (10 mg total) by mouth daily. 12/15/21   Hali Marry, MD  fluticasone (FLONASE) 50 MCG/ACT nasal spray Place 1 spray into both nostrils daily. 08/25/18  [name], PA-C  glucose blood test strip To be used twice daily for testing blood sugars. E11.65 04/03/21   Hali Marry, MD  ipratropium-albuterol (DUONEB) 0.5-2.5 (3) MG/3ML SOLN Take 3 mLs by nebulization every 6 (six) hours as needed. 01/25/20   Hali Marry, MD  irbesartan-hydrochlorothiazide (AVALIDE) 300-12.5 MG tablet Take 0.5 tablets by mouth daily. 09/06/21   Hali Marry, MD  isosorbide mononitrate (IMDUR) 60 MG 24 hr tablet Take 1 tablet (60 mg total) by mouth daily. 12/15/21   Hali Marry, MD  levothyroxine (SYNTHROID) 125 MCG tablet Take 1 tablet (125 mcg total) by mouth daily before breakfast. 12/08/21   Hali Marry, MD  Mepolizumab ([name]) 100 MG/ML SOAJ Inject 1 mL (100 mg total) into the skin every 28 (twenty-eight) days. 12/18/21   Mannam, Hart Robinsons, MD  metFORMIN (GLUCOPHAGE) 500 MG tablet TAKE TWO TABLETS BY MOUTH TWICE DAILY WITH A MEAL 10/23/21   Hali Marry, MD  nitroGLYCERIN (NITROSTAT) 0.4 MG SL tablet Place 1 tablet (0.4 mg total) under the tongue every 5 (five) minutes as needed for chest pain (or tightness). 04/03/21   Hali Marry, MD  Omega-3 Fatty Acids (FISH OIL PO) Take 1,000 Units by mouth daily.    [provider]  Semaglutide, 2 MG/DOSE, 8 MG/3ML SOPN Inject 2 mg as directed once a week. 12/07/21   Hali Marry, MD  VITAMIN D, CHOLECALCIFEROL, PO Take 1 tablet by mouth daily in the afternoon.    [provider]      Allergies    Latex and Sulfa antibiotics    Review of Systems   Review of Systems  Constitutional:  Negative for chills and fever.  HENT:  Negative for congestion and rhinorrhea.   Eyes:  Negative for redness and visual disturbance.  Respiratory:  Negative for shortness of breath and wheezing.   Cardiovascular:  Positive for chest pain. Negative for palpitations.  Gastrointestinal:  Negative for nausea and vomiting.  Genitourinary:  Negative for dysuria and urgency.  Musculoskeletal:  Negative for arthralgias and myalgias.  Skin:  Negative for pallor and wound.  Neurological:  Negative for dizziness and headaches.   Physical Exam Updated Vital Signs BP (!) 107/51 (BP Location: Right Arm)    Pulse 73    Temp 98 F (36.7 C) (Oral)    Resp 16    Ht _0  (1.6 m)    Wt 84.8 kg    SpO2 95%    BMI 33.12 kg/m  Physical Exam Vitals and nursing note reviewed.  Constitutional:      General: She is not in acute distress.    Appearance: She is well-developed. She is not diaphoretic.  HENT:      Head: Normocephalic and atraumatic.  Eyes:     Pupils: Pupils are equal, round, and reactive to light.  Cardiovascular:     Rate and Rhythm: Normal rate and regular rhythm.     Heart sounds: No murmur heard.   No friction rub. No gallop.  Pulmonary:     Effort: Pulmonary effort is normal.     Breath sounds: No wheezing or rales.  Chest:     Chest wall: No tenderness.  Abdominal:     General: There is no distension.     Palpations: Abdomen is soft.     Tenderness: There is no abdominal tenderness.  Musculoskeletal:        General: No tenderness.     Cervical back: Normal  range of motion and neck supple.  Skin:    General: Skin is warm and dry.  Neurological:     Mental Status: She is alert and oriented to person, place, and time.     Cranial Nerves: Cranial nerves 2-12 are intact.     Sensory: Sensation is intact.     Motor: Motor function is intact.     Coordination: Coordination is intact.     Comments: Benign neurologic exam  Psychiatric:        Behavior: Behavior normal.    ED Results / Procedures / Treatments   Labs (all labs ordered are listed, but only abnormal results are displayed) Labs Reviewed  BASIC METABOLIC PANEL - Abnormal; Notable for the following components:      Result Value   Glucose, Bld 113 (*)    All other components within normal limits  CBC - Abnormal; Notable for the following components:   HCT 35.8 (*)    All other components within normal limits  GLUCOSE, CAPILLARY - Abnormal; Notable for the following components:   Glucose-Capillary 101 (*)    All other components within normal limits  LIPID PANEL - Abnormal; Notable for the following components:   HDL 38 (*)    All other components within normal limits  RESP PANEL BY RT-PCR (FLU A&B, COVID) ARPGX2  GLUCOSE, CAPILLARY  HEMOGLOBIN A1C  TROPONIN I (HIGH SENSITIVITY)  TROPONIN I (HIGH SENSITIVITY)    EKG EKG Interpretation  Date/Time:  Tuesday December 26 2021 13:19:14  EST Ventricular Rate:  76 PR Interval:  248 QRS Duration: 68 QT Interval:  380 QTC Calculation: 427 R Axis:   38 Text Interpretation: Sinus rhythm with 1st degree A-V block Low voltage QRS Cannot rule out Anterior infarct , age undetermined Abnormal ECG When compared with ECG of 06-Oct-2021 11:25, PREVIOUS ECG IS PRESENT No significant change since last tracing Confirmed by Deno Etienne [phone]) on 12/26/2021 1:46:00 PM  Radiology DG Chest 2 View  Result Date: 12/26/2021 CLINICAL DATA:  Chest pain and tingling right arm. EXAM: CHEST - 2 VIEW COMPARISON:  04/25/2021 FINDINGS: The heart size and mediastinal contours are within normal limits. Both lungs are clear. The visualized skeletal structures are unremarkable. IMPRESSION: No active cardiopulmonary disease. Electronically Signed   By: Franchot Gallo M.D.   On: 12/26/2021 13:52   CT Head Wo Contrast  Result Date: 12/26/2021 CLINICAL DATA:  TIA, LEFT arm numbness. EXAM: CT HEAD WITHOUT CONTRAST TECHNIQUE: Contiguous axial images were obtained from the base of the skull through the vertex without intravenous contrast. RADIATION DOSE REDUCTION: This exam was performed according to the departmental dose-optimization program which includes automated exposure control, adjustment of the mA and/or kV according to patient size and/or use of iterative reconstruction technique. COMPARISON:  MRI of the brain for October 14, 2021. FINDINGS: Brain: No evidence of hemorrhage, hydrocephalus, extra-axial collection or mass lesion/mass effect. Signs of atrophy and chronic microvascular ischemic change with slight worsening since previous imaging Vascular: No hyperdense vessel or unexpected calcification. Skull: Normal. Negative for fracture or focal lesion. Sinuses/Orbits: Partial opacification of sphenoid sinuses. Scattered opacification of ethmoid sinuses. Maxillary sinuses are not visualized in their entirety only limited imaging of the remainder of the sinuses and  orbits. Other: None IMPRESSION: No acute intracranial abnormality. Signs of atrophy, chronic microvascular ischemic change and sinus disease. Electronically Signed   By: Zetta Bills M.D.   On: 12/26/2021 15:04   MR ANGIO HEAD WO CONTRAST  Result Date: 12/27/2021 CLINICAL DATA:  Episode of left arm numbness EXAM: MRI HEAD WITHOUT CONTRAST MRA HEAD WITHOUT CONTRAST MRA NECK WITHOUT CONTRAST TECHNIQUE: Multiplanar, multiecho pulse sequences of the brain and surrounding structures were obtained without intravenous contrast. Angiographic images of the Circle of Willis were obtained using MRA technique without intravenous contrast. Angiographic images of the neck were obtained using MRA technique without intravenous contrast. Carotid stenosis measurements (when applicable) are obtained utilizing NASCET criteria, using the distal internal carotid diameter as the denominator. COMPARISON:  Head CT from yesterday and brain MRI 10/14/2021 FINDINGS: MRI HEAD FINDINGS Brain: No acute infarction, hemorrhage, hydrocephalus, extra-axial collection or mass lesion. Minor FLAIR hyperintensity in the cerebral white matter attributed to chronic small vessel ischemia, less than typical for age. Brain volume is normal Vascular: Preserved flow voids Skull and upper cervical spine: Normal marrow signal. Sinuses/Orbits: Mucosal thickening which is generalized in the paranasal sinuses and progressed from comparison MRI. Bilateral cataract resection. Partial left mastoid opacification with negative nasopharynx, also seen on prior. MRA HEAD FINDINGS Extensive atheromatous disease, notable when compared to the age normal appearance of the brain. Atheromatous plaque affecting the bilateral carotid siphons with ~ 50% narrowing at the right anterior genu and advanced narrowing at the left anterior genu where there is flow gap. Additional moderate left-sided stenosis at the paraclinoid ICA segment. 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 Diffuse atheromatous irregularity of MCA branches. A left M2 branch origin is stenotic with flow gap reformats. Small left A1 segment, likely developmental. Extensive atheromatous irregularity of the bilateral V4 segments with high-grade narrowing and flow gap on the left. Either atheromatous irregularity or 2 mm aneurysm is possible at the left V4 segment on source images. Atheromatous irregularity of the basilar with up to 50% narrowing. Fetal type right PCA flow. Atheromatous irregularity of the posterior cerebral arteries with high-grade stenosis and flow gaps at the upper branch on the right. Moderate narrowing at the left P3 segment. MRA NECK FINDINGS Antegrade flow in the carotid and vertebral arteries by time-of-flight technique. Unremarkable aortic arch with 3 vessel branching. Allowing for motion and flow artifact, there is atheromatous plaque at the right more than left proximal ICA without evidence of flow reducing stenosis. Robust flow in the bilateral vertebral arteries. Due to artifact the vertebral origins are not assessed. Overall moderately degraded study due to motion artifact. IMPRESSION: Brain MRI: 1. No emergent finding.  Age normal appearance of the brain. 2. Generalized sinusitis which is increased from comparison November 2022. Intracranial MRA: 1. No emergent finding but advanced atheromatous disease with multiple high-grade narrowings described above. 2. 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 Neck MRA: 1. Moderately degraded study due to motion artifact. 2. No indication of acute process or flow limiting stenosis. Electronically Signed   By: Jorje Guild M.D.   On: 12/27/2021 06:38   MR ANGIO NECK WO CONTRAST  Result Date: 12/27/2021 CLINICAL DATA:  Episode of left arm numbness EXAM: MRI HEAD WITHOUT CONTRAST MRA HEAD WITHOUT CONTRAST MRA NECK WITHOUT CONTRAST TECHNIQUE: Multiplanar, multiecho pulse sequences of the brain and surrounding  structures were obtained without intravenous contrast. Angiographic images of the Circle of Willis were obtained using MRA technique without intravenous contrast. Angiographic images of the neck were obtained using MRA technique without intravenous contrast. Carotid stenosis measurements (when applicable) are obtained utilizing NASCET criteria, using the distal internal carotid diameter as the denominator. COMPARISON:  Head CT from yesterday and brain MRI 10/14/2021 FINDINGS: MRI HEAD FINDINGS Brain: No  acute infarction, hemorrhage, hydrocephalus, extra-axial collection or mass lesion. Minor FLAIR hyperintensity in the cerebral white matter attributed to chronic small vessel ischemia, less than typical for age. Brain volume is normal Vascular: Preserved flow voids Skull and upper cervical spine: Normal marrow signal. Sinuses/Orbits: Mucosal thickening which is generalized in the paranasal sinuses and progressed from comparison MRI. Bilateral cataract resection. Partial left mastoid opacification with negative nasopharynx, also seen on prior. MRA HEAD FINDINGS Extensive atheromatous disease, notable when compared to the age normal appearance of the brain. Atheromatous plaque affecting the bilateral carotid siphons with ~ 50% narrowing at the right anterior genu and advanced narrowing at the left anterior genu where there is flow gap. Additional moderate left-sided stenosis at the paraclinoid ICA segment. Early branching right MCA with moderate M1 stenosis. Diffuse atheromatous irregularity of MCA branches. A left M2 branch origin is stenotic with flow gap reformats. Small left A1 segment, likely developmental. Extensive atheromatous irregularity of the bilateral V4 segments with high-grade narrowing and flow gap on the left. Either atheromatous irregularity or 2 mm aneurysm is possible at the left V4 segment on source images. Atheromatous irregularity of the basilar with up to 50% narrowing. Fetal type right PCA  flow. Atheromatous irregularity of the posterior cerebral arteries with high-grade stenosis and flow gaps at the upper branch on the right. Moderate narrowing at the left P3 segment. MRA NECK FINDINGS Antegrade flow in the carotid and vertebral arteries by time-of-flight technique. Unremarkable aortic arch with 3 vessel branching. Allowing for motion and flow artifact, there is atheromatous plaque at the right more than left proximal ICA without evidence of flow reducing stenosis. Robust flow in the bilateral vertebral arteries. Due to artifact the vertebral origins are not assessed. Overall moderately degraded study due to motion artifact. IMPRESSION: Brain MRI: 1. No emergent finding.  Age normal appearance of the brain. 2. Generalized sinusitis which is increased from comparison November 2022. Intracranial MRA: 1. No emergent finding but advanced atheromatous disease with multiple high-grade narrowings described above. 2. 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 Neck MRA: 1. Moderately degraded study due to motion artifact. 2. No indication of acute process or flow limiting stenosis. Electronically Signed   By: Jorje Guild M.D.   On: 12/27/2021 06:38   MR BRAIN WO CONTRAST  Result Date: 12/27/2021 CLINICAL DATA:  Episode of left arm numbness EXAM: MRI HEAD WITHOUT CONTRAST MRA HEAD WITHOUT CONTRAST MRA NECK WITHOUT CONTRAST TECHNIQUE: Multiplanar, multiecho pulse sequences of the brain and surrounding structures were obtained without intravenous contrast. Angiographic images of the Circle of Willis were obtained using MRA technique without intravenous contrast. Angiographic images of the neck were obtained using MRA technique without intravenous contrast. Carotid stenosis measurements (when applicable) are obtained utilizing NASCET criteria, using the distal internal carotid diameter as the denominator. COMPARISON:  Head CT from yesterday and brain MRI 10/14/2021 FINDINGS:  MRI HEAD FINDINGS Brain: No acute infarction, hemorrhage, hydrocephalus, extra-axial collection or mass lesion. Minor FLAIR hyperintensity in the cerebral white matter attributed to chronic small vessel ischemia, less than typical for age. Brain volume is normal Vascular: Preserved flow voids Skull and upper cervical spine: Normal marrow signal. Sinuses/Orbits: Mucosal thickening which is generalized in the paranasal sinuses and progressed from comparison MRI. Bilateral cataract resection. Partial left mastoid opacification with negative nasopharynx, also seen on prior. MRA HEAD FINDINGS Extensive atheromatous disease, notable when compared to the age normal appearance of the brain. Atheromatous plaque affecting the bilateral carotid siphons with ~  50% narrowing at the right anterior genu and advanced narrowing at the left anterior genu where there is flow gap. Additional moderate left-sided stenosis at the paraclinoid ICA segment. Early branching right MCA with moderate M1 stenosis. Diffuse atheromatous irregularity of MCA branches. A left M2 branch origin is stenotic with flow gap reformats. Small left A1 segment, likely developmental. Extensive atheromatous irregularity of the bilateral V4 segments with high-grade narrowing and flow gap on the left. Either atheromatous irregularity or 2 mm aneurysm is possible at the left V4 segment on source images. Atheromatous irregularity of the basilar with up to 50% narrowing. Fetal type right PCA flow. Atheromatous irregularity of the posterior cerebral arteries with high-grade stenosis and flow gaps at the upper branch on the right. Moderate narrowing at the left P3 segment. MRA NECK FINDINGS Antegrade flow in the carotid and vertebral arteries by time-of-flight technique. Unremarkable aortic arch with 3 vessel branching. Allowing for motion and flow artifact, there is atheromatous plaque at the right more than left proximal ICA without evidence of flow reducing stenosis.  Robust flow in the bilateral vertebral arteries. Due to artifact the vertebral origins are not assessed. Overall moderately degraded study due to motion artifact. IMPRESSION: Brain MRI: 1. No emergent finding.  Age normal appearance of the brain. 2. Generalized sinusitis which is increased from comparison November 2022. Intracranial MRA: 1. No emergent finding but advanced atheromatous disease with multiple high-grade narrowings described above. 2. 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 Neck MRA: 1. Moderately degraded study due to motion artifact. 2. No indication of acute process or flow limiting stenosis. Electronically Signed   By: Jorje Guild M.D.   On: 12/27/2021 06:38    Procedures Procedures    Medications Ordered in ED Medications  allopurinol (ZYLOPRIM) tablet 300 mg (has no administration in time range)  atenolol (TENORMIN) tablet 100 mg (has no administration in time range)  atorvastatin (LIPITOR) tablet 80 mg (has no administration in time range)  ezetimibe (ZETIA) tablet 10 mg (has no administration in time range)  isosorbide mononitrate (IMDUR) 24 hr tablet 60 mg (has no administration in time range)  levothyroxine (SYNTHROID) tablet 125 mcg (125 mcg Oral Given 12/27/21 0636)  apixaban (ELIQUIS) tablet 5 mg (5 mg Oral Given 12/27/21 0118)  mometasone-formoterol (DULERA) 100-5 MCG/ACT inhaler 2 puff (2 puffs Inhalation Not Given 12/27/21 0207)  ipratropium-albuterol (DUONEB) 0.5-2.5 (3) MG/3ML nebulizer solution 3 mL (has no administration in time range)  fluticasone (FLONASE) 50 MCG/ACT nasal spray 1 spray (has no administration in time range)  acetaminophen (TYLENOL) tablet 650 mg (has no administration in time range)    Or  acetaminophen (TYLENOL) 160 MG/5ML solution 650 mg (has no administration in time range)    Or  acetaminophen (TYLENOL) suppository 650 mg (has no administration in time range)  aspirin EC tablet 81 mg (has no administration  in time range)  insulin aspart (novoLOG) injection 0-15 Units (has no administration in time range)  insulin aspart (novoLOG) injection 0-5 Units (0 Units Subcutaneous Not Given 12/27/21 0110)  sodium chloride 0.9 % bolus 1,000 mL (0 mLs Intravenous Stopped 12/26/21 1609)   stroke: mapping our early stages of recovery book ( Does not apply Given 12/27/21 0118)    ED Course/ Medical Decision Making/ A&P                           Medical Decision Making Amount and/or Complexity of Data Reviewed Labs:  ordered. Radiology: ordered.  Risk Decision regarding hospitalization.   76 yo F with a chief complaints of an episode where she felt like her left arm was numb and she had a disturbance to her vision.  This seemed to come and go for a couple hours and now is resolved.  She had a similar thing happen a couple days ago but was much shorter in duration.  Has had a heart attack in the past and thinks this feels different.  Denies history of stroke.  Denies head injury.  Will obtain a CT scan of the head blood work bolus of IV fluids.  Concern for near syncope versus TIA.  CT scan I received a call from the radiologist with some concern for white matter finding however he is not sure if this is old based on most recent MRI.  I will discuss this with the neurologist on-call.  Signed out to Dr. Roslynn Amble, please see their note for further details of care.  The patients results and plan were reviewed and discussed.   Any x-rays performed were independently reviewed by myself.   Differential diagnosis were considered with the presenting HPI.  Medications  allopurinol (ZYLOPRIM) tablet 300 mg (has no administration in time range)  atenolol (TENORMIN) tablet 100 mg (has no administration in time range)  atorvastatin (LIPITOR) tablet 80 mg (has no administration in time range)  ezetimibe (ZETIA) tablet 10 mg (has no administration in time range)  isosorbide mononitrate (IMDUR) 24 hr tablet 60 mg (has  no administration in time range)  levothyroxine (SYNTHROID) tablet 125 mcg (125 mcg Oral Given 12/27/21 0636)  apixaban (ELIQUIS) tablet 5 mg (5 mg Oral Given 12/27/21 0118)  mometasone-formoterol (DULERA) 100-5 MCG/ACT inhaler 2 puff (2 puffs Inhalation Not Given 12/27/21 0207)  ipratropium-albuterol (DUONEB) 0.5-2.5 (3) MG/3ML nebulizer solution 3 mL (has no administration in time range)  fluticasone (FLONASE) 50 MCG/ACT nasal spray 1 spray (has no administration in time range)  acetaminophen (TYLENOL) tablet 650 mg (has no administration in time range)    Or  acetaminophen (TYLENOL) 160 MG/5ML solution 650 mg (has no administration in time range)    Or  acetaminophen (TYLENOL) suppository 650 mg (has no administration in time range)  aspirin EC tablet 81 mg (has no administration in time range)  insulin aspart (novoLOG) injection 0-15 Units (has no administration in time range)  insulin aspart (novoLOG) injection 0-5 Units (0 Units Subcutaneous Not Given 12/27/21 0110)  sodium chloride 0.9 % bolus 1,000 mL (0 mLs Intravenous Stopped 12/26/21 1609)   stroke: mapping our early stages of recovery book ( Does not apply Given 12/27/21 0118)    Vitals:   12/26/21 1926 12/26/21 2159 12/27/21 0009 12/27/21 0300  BP: (!) 145/72 (!) 123/59 135/75 (!) 107/51  Pulse: 85 73 75 73  Resp: _0 Temp:  98.1 F (36.7 C) (!) 97.5 F (36.4 C) 98 F (36.7 C)  TempSrc:  Oral Oral Oral  SpO2: 93% 97% 99% 95%  Weight:      Height:        Final diagnoses:  TIA (transient ischemic attack)            Final Clinical Impression(s) / ED Diagnoses Final diagnoses:  TIA (transient ischemic attack)    Rx / DC Orders ED Discharge Orders     None         Deno Etienne, DO 12/27/21 0700

## 2021-12-26 NOTE — ED Notes (Signed)
Pt. Here today due to having numbness in the L arm and L hand today that started at 11am today.  Pt. Reports this is consistant in happening.  Pt. Had this same incident on Sunday with an intense headache on Sunday.

## 2021-12-26 NOTE — Progress Notes (Deleted)
Chronic Care Management Pharmacy Note  12/26/2021 Name:  Tara Campbell MRN:  [id number] DOB:  [birth year]  Summary: addressed HTN, HLD, DM, Afib. Patient is requesting all RX sent to mail order pharmacy to start obtaining her medicines this way instead of [facility]. Patient assistance paperwork/eligibility updates: - Repatha approved - she will call company to obtain first shipment of medication supply  - Eliquis denied her - she can reapply next year if desired, provided guidance for her to call her insurance for a statement of [place] - eligible by income threshold, provided her with paperwork at today's visit   Recommendations/Changes made from today's visit:  - Patient requesting all RX sent to mail order to switch over to this process - no medication changes, provided support for ozempic to be covered for 2023 year. Printed application for patient to fill out and return  Plan: f/u with pharmacist in 1 month  Subjective: Tara Campbell is an 76 y.o. year old female who is a primary patient of Metheney, Rene Kocher, MD.  The CCM team was consulted for assistance with disease management and care coordination needs.    Engaged with patient face to face for follow up visit in response to provider referral for pharmacy case management and/or care coordination services.   Consent to Services:  The patient was given information about Chronic Care Management services, agreed to services, and gave verbal consent prior to initiation of services.  Please see initial visit note for detailed documentation.   Patient Care Team: Hali Marry, MD as PCP - General (Family Medicine) Juanito Doom, MD as Consulting Physician (Pulmonary Disease) Lelon Perla, MD as Consulting Physician (Cardiology) Darius Bump, [facility] as Pharmacist (Pharmacist)   Objective:  Lab Results  Component Value Date   CREATININE 0.85 10/09/2021    CREATININE 1.11 (H) 04/27/2021   CREATININE 1.05 (H) 04/26/2021    Lab Results  Component Value Date   HGBA1C 6.5 (A) 12/07/2021   Last diabetic Eye exam:  Lab Results  Component Value Date/Time   HMDIABEYEEXA No Retinopathy 04/05/2021 12:00 AM    Last diabetic Foot exam: No results found for: HMDIABFOOTEX      Component Value Date/Time   CHOL 140 09/04/2021 0958   TRIG 222 (H) 09/04/2021 0958   HDL 44 (L) 09/04/2021 0958   CHOLHDL 3.2 09/04/2021 0958   VLDL 50 (H) 04/26/2021 0449   LDLCALC 68 09/04/2021 0958    Hepatic Function Latest Ref Rng & Units 09/04/2021 04/24/2021 03/22/2021  Total Protein 6.1 - 8.1 g/dL 6.4 7.1 7.0  Albumin 3.6 - 5.1 g/dL - - -  AST 10 - 35 U/L '11 19 13  ' ALT 6 - 29 U/L '12 17 16  ' Alk Phosphatase 33 - 130 U/L - - -  Total Bilirubin 0.2 - 1.2 mg/dL 0.4 0.4 0.5  Bilirubin, Direct 0.0 - 0.2 mg/dL 0.1 - -    Lab Results  Component Value Date/Time   TSH 6.17 (H) 12/07/2021 12:00 AM   TSH 2.65 04/24/2021 12:00 AM    CBC Latest Ref Rng & Units 04/27/2021 04/26/2021 04/25/2021  WBC 4.0 - 10.5 K/uL 7.2 9.0 8.8  Hemoglobin 12.0 - 15.0 g/dL 10.9(L) 12.4 12.6  Hematocrit 36.0 - 46.0 % 32.4(L) 37.2 37.5  Platelets 150 - 400 K/uL 265 279 305    Social History   Tobacco Use  Smoking Status Never  Smokeless Tobacco Never   BP Readings from Last 3  Encounters:  12/07/21 (!) 104/49  11/10/21 140/70  11/03/21 138/71   Pulse Readings from Last 3 Encounters:  12/07/21 74  11/10/21 88  11/03/21 83   Wt Readings from Last 3 Encounters:  12/07/21 187 lb (84.8 kg)  11/10/21 188 lb (85.3 kg)  10/31/21 186 lb 3.2 oz (84.5 kg)    Assessment: Review of patient past medical history, allergies, medications, health status, including review of consultants reports, laboratory and other test data, was performed as part of comprehensive evaluation and provision of chronic care management services.   SDOH:  (Social Determinants of Health) assessments and  interventions performed:    CCM Care Plan  Allergies  Allergen Reactions   Latex Hives   Sulfa Antibiotics Rash    Medications Reviewed Today     Reviewed by Hali Marry, MD (Physician) on 12/07/21 at 1146  Med List Status: <None>   Medication Order Taking? Sig Documenting Provider Last Dose Status Informant  acetaminophen (TYLENOL) 650 MG CR tablet [id number] No Take 1 tablet (650 mg total) by mouth every 8 (eight) hours as needed for pain. Silverio Decamp, MD Taking Active   albuterol (VENTOLIN HFA) 108 (90 Base) MCG/ACT inhaler [id number] No Inhale 2 puffs into the lungs every 6 (six) hours as needed for wheezing. Hali Marry, MD Taking Active Self  allopurinol (ZYLOPRIM) 300 MG tablet [id number] No Take 1 tablet (300 mg total) by mouth 2 (two) times daily. Hali Marry, MD Taking Active   apixaban (ELIQUIS) 5 MG TABS tablet [id number] No Take 5 mg by mouth 2 (two) times daily. [provider] Taking Active   atenolol (TENORMIN) 100 MG tablet [id number] No Take 1 tablet (100 mg total) by mouth daily. Hali Marry, MD Taking Active   atorvastatin (LIPITOR) 80 MG tablet [id number] No Take 1 tablet (80 mg total) by mouth at bedtime. Hali Marry, MD Taking Active Self  blood glucose meter kit and supplies KIT [id number] No Check morning fasting blood glucose daily and up to 4 times daily as directed [name], PA-C Taking Active Self  budesonide-formoterol [facility]) 80-4.5 MCG/ACT inhaler [id number] No Inhale 2 puffs into the lungs in the morning and at bedtime. Marshell Garfinkel, MD Taking Active   Cyanocobalamin (B-12 PO) [id number] No Take 1,000 mcg by mouth daily. [provider] Taking Active Self  empagliflozin (JARDIANCE) 10 MG TABS tablet [id number] No Take 1 tablet (10 mg total) by mouth daily before breakfast. Larey Dresser, MD Taking Active   EPINEPHrine 0.3 mg/0.3 mL IJ SOAJ injection [id number] No  Inject 0.3 mg into the muscle as needed for anaphylaxis. Call 9-1-1 after use. Marshell Garfinkel, MD Taking Active   Evolocumab with Infusor ([place]) 420 MG/3.5ML SOCT [id number] No Inject 420 mg into the skin every 30 (thirty) days. Lelon Perla, MD Taking Active Self  ezetimibe (ZETIA) 10 MG tablet [id number]  TAKE ONE TABLET BY MOUTH EVERY DAY Lelon Perla, MD  Active   fluticasone (FLONASE) 50 MCG/ACT nasal spray [id number] No Place 1 spray into both nostrils daily. [name], [place] Taking Active Self  glucose blood test strip [id number] No To be used twice daily for testing blood sugars. E11.65 Hali Marry, MD Taking Active Self  ipratropium-albuterol (DUONEB) 0.5-2.5 (3) MG/3ML SOLN [id number] No Take 3 mLs by nebulization every 6 (six) hours as needed. Hali Marry, MD Taking Active Self  irbesartan-hydrochlorothiazide (AVALIDE) 300-12.5 MG tablet [id number] No Take 0.5 tablets by mouth  daily. Hali Marry, MD Taking Active   isosorbide mononitrate (IMDUR) 60 MG 24 hr tablet [id number] No Take 1 tablet (60 mg total) by mouth daily. Lelon Perla, MD Taking Active Self  levothyroxine (SYNTHROID) 112 MCG tablet [id number] No Take 1 tablet (112 mcg total) by mouth daily before breakfast. Hali Marry, MD Taking Active   Mepolizumab ([name]) 100 MG/ML Darden Palmer [id number] No Inject 1 mL (100 mg total) into the skin every 28 (twenty-eight) days. Marshell Garfinkel, MD Taking Active Self  metFORMIN (GLUCOPHAGE) 500 MG tablet [id number] No TAKE TWO TABLETS BY MOUTH TWICE DAILY WITH A MEAL Metheney, Rene Kocher, MD Taking Active   nitroGLYCERIN (NITROSTAT) 0.4 MG SL tablet [id number] No Place 1 tablet (0.4 mg total) under the tongue every 5 (five) minutes as needed for chest pain (or tightness). Hali Marry, MD Taking Active   Omega-3 Fatty Acids (FISH OIL PO) [id number] No Take 1,000 Units by mouth daily. [provider]  Taking Active Self  Discontinued 12/07/21 1052   Semaglutide, 2 MG/DOSE, 8 MG/3ML SOPN [id number] Yes Inject 2 mg as directed once a week. Hali Marry, MD  Active   VITAMIN D, CHOLECALCIFEROL, PO [id number] No Take 1 tablet by mouth daily in the afternoon. 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 10/02/2021   BMI 33.0-33.9,adult 09/06/2021   Angina pectoris ([place]) 06/23/2021   Hyperlipidemia 04/27/2021   History of non-ST elevation myocardial infarction (NSTEMI) 04/25/2021   Hilar adenopathy 10/13/2018   Mediastinal adenopathy 10/13/2018   Dupuytren's contracture of left hand 08/28/2018   Trigger finger, left index finger 08/28/2018   Non-seasonal allergic rhinitis 08/26/2018   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   Controlled diabetes mellitus type 2 with complications ([place]) 25/85/2778   Class 1 obesity due to excess calories with serious comorbidity in adult 12/27/2017   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   Encounter for long-term (current) use of medications 11/28/2017   Anticoagulant long-term use 10/21/2017   Sensorineural hearing loss (SNHL) of both ears 09/27/2017   Hypertension goal BP (blood pressure) < 140/90 08/29/2017   Coronary artery disease involving native coronary artery of native heart with angina pectoris ([place]) 08/29/2017   Hearing difficulty of both ears 05/29/2017   Trochanteric bursitis, right hip 05/29/2017   Gout of ankle 04/08/2017   History of myocardial infarct at age greater than 63 years 01/23/2017   Mixed diabetic hyperlipidemia associated with type 2 diabetes mellitus ([place]) 01/23/2017   Acquired hypothyroidism 01/23/2017   Severe persistent allergic asthma 01/16/2017   Paroxysmal atrial fibrillation ([place]) 01/16/2017   Cardiomegaly 01/16/2017     Immunization History  Administered Date(s) Administered   Fluad Quad(high Dose 65+) 09/22/2019, 09/06/2021   Influenza, High Dose Seasonal PF 08/29/2017, 09/07/2018, 08/13/2020   Moderna SARS-COV2 Booster Vaccination 10/03/2021   PFIZER(Purple Top)SARS-COV-2 Vaccination 12/13/2019, 01/04/2020, 09/03/2020, 03/24/2021   Pfizer Covid-19 Vaccine Bivalent Booster 85yr & up 10/17/2021   Pneumococcal Conjugate-13 11/16/2019   Pneumococcal Polysaccharide-23 08/29/2017   Tdap 05/29/2017   Zoster Recombinat (Shingrix) 02/28/2018, 06/12/2018    Conditions to be addressed/monitored: Atrial Fibrillation, HTN, HLD, and DMII  There are no care plans that you recently modified to display for this patient.    Medication Assistance:  Application for repatha confirmed! Coverage through  12/02/21. Application for Eliquis pending.  Patient's preferred pharmacy is:  [facility], [place] [address] [place] [address] Phone: [phone] Fax: [phone]  [facility]) - [address], [place] [address] Phone: [phone] Fax: [phone]   Uses pill box? Yes Pt endorses 100% compliance  Follow Up:  Patient agrees to Care Plan and Follow-up.  Plan: Telephone follow up appointment with care management team member scheduled for:  1 month  Larinda Buttery, PharmD Clinical Pharmacist [facility] [phone]

## 2021-12-26 NOTE — ED Provider Notes (Signed)
Signout note  76 year old lady with history of paroxysmal A. fib on Eliquis presenting to ER with concern for episode of left arm numbness.  Episode today lasting about 2 hours.  Had similar episode on Sunday.  Was associated with visual disturbance.  Currently asymptomatic and nonfocal neurologic exam.  CT head negative.  Concern for possibility of TIA versus small stroke.  At time of signout, plan to discuss with neurology.  Dr. Cheral Marker recommends admission for stroke work-up to the medicine service.  He will notify the [facility] neurology team.  Paged [facility] for admit.  Updated patient and sister at bedside.  Agreeable for admission.  Remains asymptomatic.   Lucrezia Starch, MD 12/26/21 [phone]

## 2021-12-26 NOTE — Consult Note (Incomplete)
Neurology Consultation Reason for Consult: TIA Referring Physician: Roslynn Amble, R  CC: Left arm numbness  History is obtained from: Patient  HPI: Tara Campbell is a 76 y.o. female ***   LKW: *** tpa given?: no, *** Premorbid modified rankin scale: *** ICH Score: ***    ROS: A 14 point ROS was performed and is negative except as noted in the HPI. *** Unable to obtain due to altered mental status.   Past Medical History:  Diagnosis Date   Allergy    Arthritis    Asthma    Colon polyps    Coronary artery disease    Diabetes mellitus without complication ([place])    Gout    Heart attack ([place]) 07/30/2016   PCI, 2 stents   Hyperlipidemia    Hypertension    Internal hemorrhoids    Left rotator cuff tear    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   Otomycosis of right ear 09/27/2017   Paroxysmal atrial fibrillation ([place])    Thyroid disease      Family History  Problem Relation Age of Onset   Hypertension Mother    Hyperlipidemia Mother    Glaucoma Father    Heart disease Father    Hypertension Father    Diabetes Father    Hyperlipidemia Father    Skin cancer Father    Diabetes Paternal Grandmother    Colon cancer Neg Hx    Esophageal cancer Neg Hx      Social History:  reports that she has never smoked. She has never used smokeless tobacco. She reports current alcohol use. She reports that she does not use drugs.   Exam: Current vital signs: BP (!) 123/59 (BP Location: Left Arm)    Pulse 73    Temp 98.1 F (36.7 C) (Oral)    Resp 14    Ht 5\' 3"  (1.6 m)    Wt 84.8 kg    SpO2 97%    BMI 33.12 kg/m  Vital signs in last 24 hours: Temp:  [97.8 F (36.6 C)-98.1 F (36.7 C)] 98.1 F (36.7 C) (01/24 2159) Pulse Rate:  [73-85] 73 (01/24 2159) Resp:  [14-20] 14 (01/24 2159) BP: (116-145)/(59-87) 123/59 (01/24 2159) SpO2:  [93 %-99 %] 97 % (01/24 2159) Weight:  [84.8 kg] 84.8 kg (01/24 1313)   Physical Exam  Constitutional: Appears well-developed and well-nourished.   Psych: Affect appropriate to situation Eyes: No scleral injection HENT: No OP obstruction MSK: no joint deformities.  Cardiovascular: Normal rate and regular rhythm.  Respiratory: Effort normal, non-labored breathing GI: Soft.  No distension. There is no tenderness.  Skin: WDI  Neuro: Mental Status: Patient is awake, alert, oriented to person, place, month, year, and situation.*** Patient is able to give a clear and coherent history.*** No signs of aphasia or neglect*** Cranial Nerves: II: Visual Fields are full. Pupils are equal, round, and reactive to light.  *** III,IV, VI: EOMI without ptosis or diploplia.  V: Facial sensation is symmetric to temperature VII: Facial movement is symmetric.  VIII: hearing is intact to voice X: Uvula elevates symmetrically XI: Shoulder shrug is symmetric. XII: tongue is midline without atrophy or fasciculations.  Motor: Tone is normal. Bulk is normal. 5/5 strength was present in all four extremities. *** Sensory: Sensation is symmetric to light touch and temperature in the arms and legs.*** Deep Tendon Reflexes: 2+ and symmetric in the biceps and patellae. *** Plantars: Toes are downgoing bilaterally. *** Cerebellar: FNF and HKS are intact bilaterally***  I have reviewed labs in epic and the results pertinent to this consultation are: ***  I have reviewed the images obtained:***  Impression: ***  Recommendations: 1) ***   Roland Rack, MD [facility] [phone]  If 7pm- 7am, please page neurology on call as listed in [place].

## 2021-12-27 ENCOUNTER — Observation Stay (HOSPITAL_BASED_OUTPATIENT_CLINIC_OR_DEPARTMENT_OTHER): Payer: PPO

## 2021-12-27 ENCOUNTER — Telehealth: Payer: Self-pay | Admitting: Pharmacist

## 2021-12-27 ENCOUNTER — Observation Stay (HOSPITAL_COMMUNITY): Payer: PPO

## 2021-12-27 DIAGNOSIS — J4550 Severe persistent asthma, uncomplicated: Secondary | ICD-10-CM

## 2021-12-27 DIAGNOSIS — E6609 Other obesity due to excess calories: Secondary | ICD-10-CM | POA: Diagnosis not present

## 2021-12-27 DIAGNOSIS — N1831 Chronic kidney disease, stage 3a: Secondary | ICD-10-CM | POA: Diagnosis not present

## 2021-12-27 DIAGNOSIS — I480 Paroxysmal atrial fibrillation: Secondary | ICD-10-CM | POA: Diagnosis not present

## 2021-12-27 DIAGNOSIS — R200 Anesthesia of skin: Secondary | ICD-10-CM | POA: Diagnosis not present

## 2021-12-27 DIAGNOSIS — E1122 Type 2 diabetes mellitus with diabetic chronic kidney disease: Secondary | ICD-10-CM

## 2021-12-27 DIAGNOSIS — M4722 Other spondylosis with radiculopathy, cervical region: Secondary | ICD-10-CM | POA: Diagnosis not present

## 2021-12-27 DIAGNOSIS — G459 Transient cerebral ischemic attack, unspecified: Secondary | ICD-10-CM

## 2021-12-27 DIAGNOSIS — Z7901 Long term (current) use of anticoagulants: Secondary | ICD-10-CM | POA: Diagnosis not present

## 2021-12-27 DIAGNOSIS — E782 Mixed hyperlipidemia: Secondary | ICD-10-CM

## 2021-12-27 DIAGNOSIS — I25119 Atherosclerotic heart disease of native coronary artery with unspecified angina pectoris: Secondary | ICD-10-CM | POA: Diagnosis not present

## 2021-12-27 LAB — ECHOCARDIOGRAM COMPLETE
AR max vel: 2.53 cm2
AV Area VTI: 2.33 cm2
AV Area mean vel: 2.16 cm2
AV Mean grad: 4 mmHg
AV Peak grad: 6.9 mmHg
Ao pk vel: 1.31 m/s
Area-P 1/2: 3.31 cm2
Height: 63 in
S' Lateral: 2.6 cm
Weight: 2991.2 oz

## 2021-12-27 LAB — GLUCOSE, CAPILLARY
Glucose-Capillary: 94 mg/dL (ref 70–99)
Glucose-Capillary: 121 mg/dL — ABNORMAL HIGH (ref 70–99)
Glucose-Capillary: 113 mg/dL — ABNORMAL HIGH (ref 70–99)

## 2021-12-27 LAB — HEMOGLOBIN A1C
Hgb A1c MFr Bld: 6.8 % — ABNORMAL HIGH (ref 4.8–5.6)
Mean Plasma Glucose: 148 mg/dL

## 2021-12-27 LAB — LIPID PANEL
Cholesterol: 133 mg/dL (ref 0–200)
HDL: 38 mg/dL — ABNORMAL LOW (ref >40)
LDL Cholesterol: 66 mg/dL (ref 0–99)
Total CHOL/HDL Ratio: 3.5 RATIO
Triglycerides: 147 mg/dL (ref <150)
VLDL: 29 mg/dL (ref 0–40)

## 2021-12-27 MED ORDER — ASPIRIN EC 81 MG PO TBEC
81.0000 mg | DELAYED_RELEASE_TABLET | Freq: Every day | ORAL | Status: DC
  Administered 2021-12-27: 09:00:00 81 mg via ORAL
  Filled 2021-12-27: qty 1

## 2021-12-27 MED ORDER — VITAMIN D (CHOLECALCIFEROL) 25 MCG (1000 UT) PO TABS: 25.0000 ug | ORAL_TABLET | Freq: Every day | ORAL | Status: AC

## 2021-12-27 MED ORDER — ATENOLOL 25 MG PO TABS
100.0000 mg | ORAL_TABLET | Freq: Every day | ORAL | Status: DC
  Administered 2021-12-27: 09:00:00 100 mg via ORAL
  Filled 2021-12-27: qty 4

## 2021-12-27 MED ORDER — IPRATROPIUM-ALBUTEROL 0.5-2.5 (3) MG/3ML IN SOLN: 3.0000 mL | Freq: Four times a day (QID) | RESPIRATORY_TRACT | Status: DC | PRN

## 2021-12-27 MED ORDER — ACETAMINOPHEN 650 MG RE SUPP: 650.0000 mg | RECTAL | Status: DC | PRN

## 2021-12-27 MED ORDER — STROKE: EARLY STAGES OF RECOVERY BOOK
Freq: Once | Status: AC
  Filled 2021-12-27: qty 1

## 2021-12-27 MED ORDER — ISOSORBIDE MONONITRATE ER 60 MG PO TB24
60.0000 mg | ORAL_TABLET | Freq: Every day | ORAL | Status: DC
  Administered 2021-12-27: 09:00:00 60 mg via ORAL
  Filled 2021-12-27: qty 1

## 2021-12-27 MED ORDER — ACETAMINOPHEN 160 MG/5ML PO SOLN: 650.0000 mg | ORAL | Status: DC | PRN

## 2021-12-27 MED ORDER — ATORVASTATIN CALCIUM 80 MG PO TABS: 80.0000 mg | ORAL_TABLET | Freq: Every day | ORAL | Status: DC

## 2021-12-27 MED ORDER — APIXABAN 5 MG PO TABS
5.0000 mg | ORAL_TABLET | Freq: Two times a day (BID) | ORAL | Status: DC
  Administered 2021-12-27 (×2): 5 mg via ORAL
  Filled 2021-12-27 (×2): qty 1

## 2021-12-27 MED ORDER — FLUTICASONE PROPIONATE 50 MCG/ACT NA SUSP
1.0000 | Freq: Every day | NASAL | Status: DC
  Administered 2021-12-27: 09:00:00 1 via NASAL
  Filled 2021-12-27: qty 16

## 2021-12-27 MED ORDER — ASPIRIN 81 MG PO TBEC: 81.0000 mg | DELAYED_RELEASE_TABLET | Freq: Every day | ORAL | 2 refills | Status: AC

## 2021-12-27 MED ORDER — MOMETASONE FURO-FORMOTEROL FUM 100-5 MCG/ACT IN AERO
2.0000 | INHALATION_SPRAY | Freq: Two times a day (BID) | RESPIRATORY_TRACT | Status: DC
  Administered 2021-12-27: 08:00:00 2 via RESPIRATORY_TRACT
  Filled 2021-12-27: qty 8.8

## 2021-12-27 MED ORDER — ACETAMINOPHEN 325 MG PO TABS: 650.0000 mg | ORAL_TABLET | ORAL | Status: DC | PRN

## 2021-12-27 MED ORDER — LEVOTHYROXINE SODIUM 25 MCG PO TABS
125.0000 ug | ORAL_TABLET | Freq: Every day | ORAL | Status: DC
  Administered 2021-12-27: 07:00:00 125 ug via ORAL
  Filled 2021-12-27: qty 1

## 2021-12-27 MED ORDER — INSULIN ASPART 100 UNIT/ML IJ SOLN: 0.0000 [IU] | Freq: Every day | INTRAMUSCULAR | Status: DC

## 2021-12-27 MED ORDER — INSULIN ASPART 100 UNIT/ML IJ SOLN
0.0000 [IU] | Freq: Three times a day (TID) | INTRAMUSCULAR | Status: DC
  Administered 2021-12-27: 13:00:00 2 [IU] via SUBCUTANEOUS

## 2021-12-27 MED ORDER — EZETIMIBE 10 MG PO TABS
10.0000 mg | ORAL_TABLET | Freq: Every day | ORAL | Status: DC
  Administered 2021-12-27: 09:00:00 10 mg via ORAL
  Filled 2021-12-27: qty 1

## 2021-12-27 MED ORDER — ALLOPURINOL 100 MG PO TABS
300.0000 mg | ORAL_TABLET | Freq: Two times a day (BID) | ORAL | Status: DC
  Administered 2021-12-27: 09:00:00 300 mg via ORAL
  Filled 2021-12-27: qty 3

## 2021-12-27 NOTE — Assessment & Plan Note (Signed)
Chronic. 

## 2021-12-27 NOTE — Discharge Summary (Signed)
Physician Discharge Summary  Tara Campbell ZPH:[id number] DOB: [birth year] DOA: 12/26/2021  PCP: Hali Marry, MD  Admit date: 12/26/2021 Discharge date: 12/27/2021  Admitted From: Home Disposition: Home  Recommendations for Outpatient Follow-up:  Follow up with PCP in 1-2 weeks Follow-up with [facility] in 4 weeks, headache clinic Patient has follow-up scheduled with neurosurgery on 01/01/2022 Recommend continue aspirin 81 mg p.o. daily Follow-up echocardiogram results which were pending at time of discharge  Home Health: No Equipment/Devices: None  Discharge Condition: Stable CODE STATUS: DNR Diet recommendation: Heart healthy/consistent carb regular diet  History of present illness:  Tara Campbell is a 76 year old female with past medical history significant for type 2 diabetes mellitus, paroxysmal atrial fibrillation on Eliquis, CAD with obstructive LAD that was unamenable to PCI last year, severe persistent asthma, hyperlipidemia, obesity who presented to [facility] ED on 1/24 with 2 episodes of vision changes and left-sided weakness.  Initial episode this past Sunday, when she was out to see a movie with her family she was walking in the parking lot and started having visual changes.  Describes vision looking like through a kaleidoscope with a severe headache and associated nausea.  She developed weakness in her legs mostly left side with some left-sided numbness in her arm lasting approximately 45 minutes with spontaneous resolution.  Today, patient was poorly eating breakfast with her grandsons and started having the same symptoms with vision changes, followed by headache and weakness in her legs.  She also complained of left-sided arm weakness.  By the time she arrived to the ED, her symptoms again once resolved.  In the ED, temperature 97.8 F, HR 78, RR 18, BP 125/84, SPO2 99% on room air.  Sodium 137, potassium 3.9, chloride 104, CO2  24, glucose 113, BUN 14, creatinine 0.90.  High sensitive troponin 4> 3.  WBC 10.0, hemoglobin 12.2, platelets 315.  COVID-19 PCR negative.  Influenza A/B PCR negative.  Chest x-ray with no active cardiopulmonary disease process.  CT head without contrast with no acute acute intracranial abnormality, signs of atrophy with chronic microvascular ischemic change and sinus disease.  Neurology was consulted.  Patient was transferred to [facility] for further evaluation for concern of TIA versus complex migraine headache.   Hospital course:  TIA versus complex migraine with aura Patient presenting as a transfer [place] with recurrent episodes of visual changes associated with headache, nausea and left-sided weakness with spontaneous resolution x2.  CT head without contrast unrevealing. MR brain without contrast with no emergent finding, age normal appearance of the brain, generalized sinusitis.  MRA head/neck with no emergent finding but with advanced atheromatosis disease with multiple high-grade narrowings, and no acute process or flow-limiting stenosis in the neck.  LDL 66.  Neurology was consulted and followed during hospital course.  Seen by PT and OT with no recommendations.  Reevaluated with neurology with clinical presentation likely representing complex aided migraine headache with aura and recommend outpatient follow-up at [facility] headache clinic.  Continue aspirin and statin.  Follow-up echocardiogram results which are pending at time of discharge.  Cervical foraminal impingement MR C-spine notable for foraminal impingement left C3-4, C4-5, C6-7, and mild anterolisthesis at C4-5 and T1-2.  Patient currently asymptomatic, no focal weakness or paresthesias.  Patient has scheduled follow-up with neurosurgery plan for Monday, 01/01/2022.  Type 2 diabetes mellitus Continue home metformin 1000 mg p.o. daily, Jardiance 10 mg p.o. daily, semaglutide 2 mg injected  weekly.  Outpatient follow-up with PCP.  Essential hypertension CAD Continue atenolol 100 mg p.o. daily, isosorbide mononitrate 60 mg p.o. daily, irbesartan-HCTZ 300-12.5 mg p.o. dail.   Severe persistent asthma Oxygenating well on room air, no respiratory distress.  Continue Symbicort.  Hyperlipidemia Continue atorvastatin 80 mg p.o. daily, Repatha monthly  Paroxysmal atrial fibrillation Continue atenolol 100 mg p.o. daily, Eliquis 5 mg p.o. twice daily.  Outpatient follow-up with cardiology.  Hypothyroidism Levothyroxine 125 mcg p.o. daily  Discharge Diagnoses:  Principal Problem:   TIA (transient ischemic attack) Active Problems:   Severe persistent allergic asthma   Paroxysmal atrial fibrillation (HCC)   Coronary artery disease involving native coronary artery of native heart with angina pectoris (HCC)   Anticoagulant long-term use - on eliquis for PAF   Class 1 obesity due to excess calories with serious comorbidity in adult   Type 2 diabetes mellitus with diabetic chronic kidney disease ([place])   Hyperlipidemia    Discharge Instructions  Discharge Instructions     Ambulatory referral to Neurology   Complete by: As directed    An appointment is requested in approximately: 4 weeks (headache clinc per Dr. Rory Percy)   Call MD for:  difficulty breathing, headache or visual disturbances   Complete by: As directed    Call MD for:  extreme fatigue   Complete by: As directed    Call MD for:  persistant dizziness or light-headedness   Complete by: As directed    Call MD for:  persistant nausea and vomiting   Complete by: As directed    Call MD for:  severe uncontrolled pain   Complete by: As directed    Call MD for:  temperature >100.4   Complete by: As directed    Diet - low sodium heart healthy   Complete by: As directed    Increase activity slowly   Complete by: As directed       Allergies as of 12/27/2021       Reactions   Latex Hives   Sulfa Antibiotics Rash         Medication List     TAKE these medications    acetaminophen 650 MG CR tablet Commonly known as: TYLENOL Take 1 tablet (650 mg total) by mouth every 8 (eight) hours as needed for pain.   albuterol 108 (90 Base) MCG/ACT inhaler Commonly known as: VENTOLIN HFA Inhale 2 puffs into the lungs every 6 (six) hours as needed for wheezing.   allopurinol 300 MG tablet Commonly known as: ZYLOPRIM Take 1 tablet (300 mg total) by mouth 2 (two) times daily.   apixaban 5 MG Tabs tablet Commonly known as: ELIQUIS Take 1 tablet (5 mg total) by mouth 2 (two) times daily.   aspirin 81 MG EC tablet Take 1 tablet (81 mg total) by mouth daily. Swallow whole. Start taking on: December 28, 2021   atenolol 100 MG tablet Commonly known as: TENORMIN Take 1 tablet (100 mg total) by mouth daily.   atorvastatin 80 MG tablet Commonly known as: LIPITOR Take 1 tablet (80 mg total) by mouth at bedtime.   B-12 PO Take 1,000 mcg by mouth daily.   blood glucose meter kit and supplies Kit Check morning fasting blood glucose daily and up to 4 times daily as directed   budesonide-formoterol 80-4.5 MCG/ACT inhaler Commonly known as: Symbicort Inhale 2 puffs into the lungs in the morning and at bedtime.   empagliflozin 10 MG Tabs tablet Commonly known as: Jardiance Take 1 tablet (10 mg  total) by mouth daily before breakfast.   EPINEPHrine 0.3 mg/0.3 mL Soaj injection Commonly known as: EPI-PEN Inject 0.3 mg into the muscle as needed for anaphylaxis. Call 9-1-1 after use.   ezetimibe 10 MG tablet Commonly known as: ZETIA Take 1 tablet (10 mg total) by mouth daily.   FISH OIL PO Take 1,000 Units by mouth daily.   fluticasone 50 MCG/ACT nasal spray Commonly known as: FLONASE Place 1 spray into both nostrils daily.   glucose blood test strip To be used twice daily for testing blood sugars. E11.65   ipratropium-albuterol 0.5-2.5 (3) MG/3ML Soln Commonly known as: DUONEB Take 3 mLs by  nebulization every 6 (six) hours as needed. What changed: reasons to take this   irbesartan-hydrochlorothiazide 300-12.5 MG tablet Commonly known as: AVALIDE Take 0.5 tablets by mouth daily.   isosorbide mononitrate 60 MG 24 hr tablet Commonly known as: IMDUR Take 1 tablet (60 mg total) by mouth daily.   levothyroxine 125 MCG tablet Commonly known as: SYNTHROID Take 1 tablet (125 mcg total) by mouth daily before breakfast.   metFORMIN 500 MG tablet Commonly known as: GLUCOPHAGE TAKE TWO TABLETS BY MOUTH TWICE DAILY WITH A MEAL What changed: See the new instructions.   nitroGLYCERIN 0.4 MG SL tablet Commonly known as: Nitrostat Place 1 tablet (0.4 mg total) under the tongue every 5 (five) minutes as needed for chest pain (or tightness).   [name] 100 MG/ML Soaj Generic drug: Mepolizumab Inject 1 mL (100 mg total) into the skin every 28 (twenty-eight) days.   Repatha Pushtronex System 420 MG/3.5ML Soct Generic drug: Evolocumab with Infusor Inject 420 mg into the skin every 30 (thirty) days.   Semaglutide (2 MG/DOSE) 8 MG/3ML Sopn Inject 2 mg as directed once a week. What changed: additional instructions   Vitamin D (Cholecalciferol) 25 MCG (1000 UT) Tabs Take 25 mcg by mouth daily in the afternoon. What changed: how much to take        Follow-up Information     Hali Marry, MD. Schedule an appointment as soon as possible for a visit in 1 week(s).   Specialty: Family Medicine Contact information: [address] [place] [address] [phone]         [facility]. Schedule an appointment as soon as possible for a visit in 3 week(s).   Specialty: Neurology Contact information: [place] [phone]               Allergies  Allergen Reactions   Latex Hives   Sulfa Antibiotics Rash    Consultations: Neurology   Procedures/Studies: DG Chest 2  View  Result Date: 12/26/2021 CLINICAL DATA:  Chest pain and tingling right arm. EXAM: CHEST - 2 VIEW COMPARISON:  04/25/2021 FINDINGS: The heart size and mediastinal contours are within normal limits. Both lungs are clear. The visualized skeletal structures are unremarkable. IMPRESSION: No active cardiopulmonary disease. Electronically Signed   By: Franchot Gallo M.D.   On: 12/26/2021 13:52   CT Head Wo Contrast  Result Date: 12/26/2021 CLINICAL DATA:  TIA, LEFT arm numbness. EXAM: CT HEAD WITHOUT CONTRAST TECHNIQUE: Contiguous axial images were obtained from the base of the skull through the vertex without intravenous contrast. RADIATION DOSE REDUCTION: This exam was performed according to the departmental dose-optimization program which includes automated exposure control, adjustment of the mA and/or kV according to patient size and/or use of iterative reconstruction technique. COMPARISON:  MRI of the brain  for October 14, 2021. FINDINGS: Brain: No evidence of hemorrhage, hydrocephalus, extra-axial collection or mass lesion/mass effect. Signs of atrophy and chronic microvascular ischemic change with slight worsening since previous imaging Vascular: No hyperdense vessel or unexpected calcification. Skull: Normal. Negative for fracture or focal lesion. Sinuses/Orbits: Partial opacification of sphenoid sinuses. Scattered opacification of ethmoid sinuses. Maxillary sinuses are not visualized in their entirety only limited imaging of the remainder of the sinuses and orbits. Other: None IMPRESSION: No acute intracranial abnormality. Signs of atrophy, chronic microvascular ischemic change and sinus disease. Electronically Signed   By: Zetta Bills M.D.   On: 12/26/2021 15:04   MR ANGIO HEAD WO CONTRAST  Result Date: 12/27/2021 CLINICAL DATA:  Episode of left arm numbness EXAM: MRI HEAD WITHOUT CONTRAST MRA HEAD WITHOUT CONTRAST MRA NECK WITHOUT CONTRAST TECHNIQUE: Multiplanar, multiecho pulse sequences of  the brain and surrounding structures were obtained without intravenous contrast. Angiographic images of the Circle of Willis were obtained using MRA technique without intravenous contrast. Angiographic images of the neck were obtained using MRA technique without intravenous contrast. Carotid stenosis measurements (when applicable) are obtained utilizing NASCET criteria, using the distal internal carotid diameter as the denominator. COMPARISON:  Head CT from yesterday and brain MRI 10/14/2021 FINDINGS: MRI HEAD FINDINGS Brain: No acute infarction, hemorrhage, hydrocephalus, extra-axial collection or mass lesion. Minor FLAIR hyperintensity in the cerebral white matter attributed to chronic small vessel ischemia, less than typical for age. Brain volume is normal Vascular: Preserved flow voids Skull and upper cervical spine: Normal marrow signal. Sinuses/Orbits: Mucosal thickening which is generalized in the paranasal sinuses and progressed from comparison MRI. Bilateral cataract resection. Partial left mastoid opacification with negative nasopharynx, also seen on prior. MRA HEAD FINDINGS Extensive atheromatous disease, notable when compared to the age normal appearance of the brain. Atheromatous plaque affecting the bilateral carotid siphons with ~ 50% narrowing at the right anterior genu and advanced narrowing at the left anterior genu where there is flow gap. Additional moderate left-sided stenosis at the paraclinoid ICA segment. Early branching right MCA with moderate M1 stenosis. Diffuse atheromatous irregularity of MCA branches. A left M2 branch origin is stenotic with flow gap reformats. Small left A1 segment, likely developmental. Extensive atheromatous irregularity of the bilateral V4 segments with high-grade narrowing and flow gap on the left. Either atheromatous irregularity or 2 mm aneurysm is possible at the left V4 segment on source images. Atheromatous irregularity of the basilar with up to 50% narrowing.  Fetal type right PCA flow. Atheromatous irregularity of the posterior cerebral arteries with high-grade stenosis and flow gaps at the upper branch on the right. Moderate narrowing at the left P3 segment. MRA NECK FINDINGS Antegrade flow in the carotid and vertebral arteries by time-of-flight technique. Unremarkable aortic arch with 3 vessel branching. Allowing for motion and flow artifact, there is atheromatous plaque at the right more than left proximal ICA without evidence of flow reducing stenosis. Robust flow in the bilateral vertebral arteries. Due to artifact the vertebral origins are not assessed. Overall moderately degraded study due to motion artifact. IMPRESSION: Brain MRI: 1. No emergent finding.  Age normal appearance of the brain. 2. Generalized sinusitis which is increased from comparison November 2022. Intracranial MRA: 1. No emergent finding but advanced atheromatous disease with multiple high-grade narrowings described above. 2. 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 Neck MRA: 1. Moderately degraded study due to motion artifact. 2. No indication of acute process or flow limiting stenosis. Electronically Signed  By: Jorje Guild M.D.   On: 12/27/2021 06:38   MR ANGIO NECK WO CONTRAST  Result Date: 12/27/2021 CLINICAL DATA:  Episode of left arm numbness EXAM: MRI HEAD WITHOUT CONTRAST MRA HEAD WITHOUT CONTRAST MRA NECK WITHOUT CONTRAST TECHNIQUE: Multiplanar, multiecho pulse sequences of the brain and surrounding structures were obtained without intravenous contrast. Angiographic images of the Circle of Willis were obtained using MRA technique without intravenous contrast. Angiographic images of the neck were obtained using MRA technique without intravenous contrast. Carotid stenosis measurements (when applicable) are obtained utilizing NASCET criteria, using the distal internal carotid diameter as the denominator. COMPARISON:  Head CT from yesterday and brain  MRI 10/14/2021 FINDINGS: MRI HEAD FINDINGS Brain: No acute infarction, hemorrhage, hydrocephalus, extra-axial collection or mass lesion. Minor FLAIR hyperintensity in the cerebral white matter attributed to chronic small vessel ischemia, less than typical for age. Brain volume is normal Vascular: Preserved flow voids Skull and upper cervical spine: Normal marrow signal. Sinuses/Orbits: Mucosal thickening which is generalized in the paranasal sinuses and progressed from comparison MRI. Bilateral cataract resection. Partial left mastoid opacification with negative nasopharynx, also seen on prior. MRA HEAD FINDINGS Extensive atheromatous disease, notable when compared to the age normal appearance of the brain. Atheromatous plaque affecting the bilateral carotid siphons with ~ 50% narrowing at the right anterior genu and advanced narrowing at the left anterior genu where there is flow gap. Additional moderate left-sided stenosis at the paraclinoid ICA segment. Early branching right MCA with moderate M1 stenosis. Diffuse atheromatous irregularity of MCA branches. A left M2 branch origin is stenotic with flow gap reformats. Small left A1 segment, likely developmental. Extensive atheromatous irregularity of the bilateral V4 segments with high-grade narrowing and flow gap on the left. Either atheromatous irregularity or 2 mm aneurysm is possible at the left V4 segment on source images. Atheromatous irregularity of the basilar with up to 50% narrowing. Fetal type right PCA flow. Atheromatous irregularity of the posterior cerebral arteries with high-grade stenosis and flow gaps at the upper branch on the right. Moderate narrowing at the left P3 segment. MRA NECK FINDINGS Antegrade flow in the carotid and vertebral arteries by time-of-flight technique. Unremarkable aortic arch with 3 vessel branching. Allowing for motion and flow artifact, there is atheromatous plaque at the right more than left proximal ICA without evidence  of flow reducing stenosis. Robust flow in the bilateral vertebral arteries. Due to artifact the vertebral origins are not assessed. Overall moderately degraded study due to motion artifact. IMPRESSION: Brain MRI: 1. No emergent finding.  Age normal appearance of the brain. 2. Generalized sinusitis which is increased from comparison November 2022. Intracranial MRA: 1. No emergent finding but advanced atheromatous disease with multiple high-grade narrowings described above. 2. 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 Neck MRA: 1. Moderately degraded study due to motion artifact. 2. No indication of acute process or flow limiting stenosis. Electronically Signed   By: Jorje Guild M.D.   On: 12/27/2021 06:38   MR BRAIN WO CONTRAST  Result Date: 12/27/2021 CLINICAL DATA:  Episode of left arm numbness EXAM: MRI HEAD WITHOUT CONTRAST MRA HEAD WITHOUT CONTRAST MRA NECK WITHOUT CONTRAST TECHNIQUE: Multiplanar, multiecho pulse sequences of the brain and surrounding structures were obtained without intravenous contrast. Angiographic images of the Circle of Willis were obtained using MRA technique without intravenous contrast. Angiographic images of the neck were obtained using MRA technique without intravenous contrast. Carotid stenosis measurements (when applicable) are obtained utilizing NASCET criteria, using the  distal internal carotid diameter as the denominator. COMPARISON:  Head CT from yesterday and brain MRI 10/14/2021 FINDINGS: MRI HEAD FINDINGS Brain: No acute infarction, hemorrhage, hydrocephalus, extra-axial collection or mass lesion. Minor FLAIR hyperintensity in the cerebral white matter attributed to chronic small vessel ischemia, less than typical for age. Brain volume is normal Vascular: Preserved flow voids Skull and upper cervical spine: Normal marrow signal. Sinuses/Orbits: Mucosal thickening which is generalized in the paranasal sinuses and progressed from comparison  MRI. Bilateral cataract resection. Partial left mastoid opacification with negative nasopharynx, also seen on prior. MRA HEAD FINDINGS Extensive atheromatous disease, notable when compared to the age normal appearance of the brain. Atheromatous plaque affecting the bilateral carotid siphons with ~ 50% narrowing at the right anterior genu and advanced narrowing at the left anterior genu where there is flow gap. Additional moderate left-sided stenosis at the paraclinoid ICA segment. Early branching right MCA with moderate M1 stenosis. Diffuse atheromatous irregularity of MCA branches. A left M2 branch origin is stenotic with flow gap reformats. Small left A1 segment, likely developmental. Extensive atheromatous irregularity of the bilateral V4 segments with high-grade narrowing and flow gap on the left. Either atheromatous irregularity or 2 mm aneurysm is possible at the left V4 segment on source images. Atheromatous irregularity of the basilar with up to 50% narrowing. Fetal type right PCA flow. Atheromatous irregularity of the posterior cerebral arteries with high-grade stenosis and flow gaps at the upper branch on the right. Moderate narrowing at the left P3 segment. MRA NECK FINDINGS Antegrade flow in the carotid and vertebral arteries by time-of-flight technique. Unremarkable aortic arch with 3 vessel branching. Allowing for motion and flow artifact, there is atheromatous plaque at the right more than left proximal ICA without evidence of flow reducing stenosis. Robust flow in the bilateral vertebral arteries. Due to artifact the vertebral origins are not assessed. Overall moderately degraded study due to motion artifact. IMPRESSION: Brain MRI: 1. No emergent finding.  Age normal appearance of the brain. 2. Generalized sinusitis which is increased from comparison November 2022. Intracranial MRA: 1. No emergent finding but advanced atheromatous disease with multiple high-grade narrowings described above. 2.  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 Neck MRA: 1. Moderately degraded study due to motion artifact. 2. No indication of acute process or flow limiting stenosis. Electronically Signed   By: Jorje Guild M.D.   On: 12/27/2021 06:38   MR CERVICAL SPINE WO CONTRAST  Result Date: 12/27/2021 CLINICAL DATA:  Cervical radiculopathy without red flag symptoms. Episode of left arm numbness lasting 2 hours EXAM: MRI CERVICAL SPINE WITHOUT CONTRAST TECHNIQUE: Multiplanar, multisequence MR imaging of the cervical spine was performed. No intravenous contrast was administered. COMPARISON:  None. FINDINGS: Alignment: Degenerative anterolisthesis at C4-5 and T1-2; 2-3 mm at T1-2. Vertebrae: No fracture, evidence of discitis, or bone lesion. Cord: Normal signal and morphology. Posterior Fossa, vertebral arteries, paraspinal tissues: Brain and sinuses reported separately. Disc levels: C2-3: Facet spurring asymmetric to the right with ankylosis. Mild right foraminal narrowing C3-4: Disc narrowing and bulging. Degenerative facet spurring on both sides. Advanced left foraminal stenosis. Mild right foraminal narrowing C4-5: Asymmetric left uncovertebral and facet spurring. Left foraminal stenosis. Patent canal and right foramen C5-6: Disc collapse with endplate and uncovertebral ridging eccentric to the right where there is also greater facet spurring. Bulging disc without cord compression. Advanced right foraminal impingement C6-7: Disc narrowing and bulging with left foraminal protrusion and uncovertebral spurring. Advanced left foraminal impingement. C7-T1:Mild facet  spurring. IMPRESSION: 1. 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 2. Foraminal impingement on the symptomatic left side at C3-4, C4-5, and C6-7. 3. Right foraminal impingement at C5-6. 4. Diffusely patent spinal canal. Electronically Signed   By: Jorje Guild M.D.   On: 12/27/2021 08:09      Subjective: Patient seen examined at bedside, resting comfortably.  Sitting in bedside chair.  No complaints this morning.  Symptoms remain resolved.  Seen by neurology with recommendations of outpatient follow-up in headache clinic as symptoms likely related to complex migraine.  No other complaints or concerns at this time.  Ready for discharge home.  Denies headache currently, no visual changes currently, no dizziness, no chest pain, no palpitations, no shortness of breath, no abdominal pain, no fever/chills/night sweats, no nausea cefonicid diarrhea, no weakness, no fatigue, no paresthesias.  No acute events overnight per nursing staff.  Discharge Exam: Vitals:   12/27/21 1213 12/27/21 1542  BP: 114/62 (!) 99/55  Pulse: 71 73  Resp: 19 17  Temp: 97.8 F (36.6 C) 98 F (36.7 C)  SpO2: 97% 95%   Vitals:   12/27/21 0736 12/27/21 0740 12/27/21 1213 12/27/21 1542  BP: 100/72  114/62 (!) 99/55  Pulse: 72  71 73  Resp: _0 Temp: 97.8 F (36.6 C)  97.8 F (36.6 C) 98 F (36.7 C)  TempSrc: Oral  Oral Oral  SpO2: 96% 95% 97% 95%  Weight:      Height:        GEN: NAD, alert and oriented x 3, wd/wn HEENT: NCAT, PERRL, EOMI, sclera clear, MMM PULM: CTAB w/o wheezes/crackles, normal respiratory effort CV: RRR w/o M/G/R GI: abd soft, NTND, NABS, no R/G/M MSK: no peripheral edema, muscle strength globally intact 5/5 bilateral upper/lower extremities NEURO: CN II-XII intact, no focal deficits, sensation to light touch intact PSYCH: normal mood/affect Integumentary: dry/intact, no rashes or wounds    The results of significant diagnostics from this hospitalization (including imaging, microbiology, ancillary and laboratory) are listed below for reference.     Microbiology: Recent Results (from the past 240 hour(s))  Resp Panel by RT-PCR (Flu A&B, Covid) Nasopharyngeal Swab     Status: None   Collection Time: 12/26/21  5:50 PM   Specimen: Nasopharyngeal Swab;  Nasopharyngeal(NP) swabs in vial transport medium  Result Value Ref Range Status   SARS Coronavirus 2 by RT PCR NEGATIVE NEGATIVE Final    Comment: (NOTE) SARS-CoV-2 target nucleic acids are NOT DETECTED.  The SARS-CoV-2 RNA is generally detectable in upper respiratory specimens during the acute phase of infection. The lowest concentration of SARS-CoV-2 viral copies this assay can detect is 138 copies/mL. A negative result does not preclude SARS-Cov-2 infection and should not be used as the sole basis for treatment or other patient management decisions. A negative result may occur with  improper specimen collection/handling, submission of specimen other than nasopharyngeal swab, presence of viral mutation(s) within the areas targeted by this assay, and inadequate number of viral copies(<138 copies/mL). A negative result must be combined with clinical observations, patient history, and epidemiological information. The expected result is Negative.  Fact Sheet for Patients:  EntrepreneurPulse.com.au  Fact Sheet for Healthcare Providers:  IncredibleEmployment.be  This test is no t yet approved or cleared by the [place] FDA and  has been authorized for detection and/or diagnosis of SARS-CoV-2 by FDA under an Emergency Use Authorization (EUA). This EUA will remain  in effect (meaning this test can  be used) for the duration of the COVID-19 declaration under Section 564(b)(1) of the Act, 21 U.S.C.section 360bbb-3(b)(1), unless the authorization is terminated  or revoked sooner.       Influenza A by PCR NEGATIVE NEGATIVE Final   Influenza B by PCR NEGATIVE NEGATIVE Final    Comment: (NOTE) The Xpert Xpress SARS-CoV-2/FLU/RSV plus assay is intended as an aid in the diagnosis of influenza from Nasopharyngeal swab specimens and should not be used as a sole basis for treatment. Nasal washings and aspirates are unacceptable for Xpert Xpress  SARS-CoV-2/FLU/RSV testing.  Fact Sheet for Patients: EntrepreneurPulse.com.au  Fact Sheet for Healthcare Providers: IncredibleEmployment.be  This test is not yet approved or cleared by the [place] FDA and has been authorized for detection and/or diagnosis of SARS-CoV-2 by FDA under an Emergency Use Authorization (EUA). This EUA will remain in effect (meaning this test can be used) for the duration of the COVID-19 declaration under Section 564(b)(1) of the Act, 21 U.S.C. section 360bbb-3(b)(1), unless the authorization is terminated or revoked.  Performed at [facility], [address] [place], [place] [address]      Labs: BNP (last 3 results) No results for input(s): BNP in the last 8760 hours. Basic Metabolic Panel: Recent Labs  Lab 12/26/21 1318  NA 137  K 3.9  CL 104  CO2 24  GLUCOSE 113*  BUN 14  CREATININE 0.90  CALCIUM 10.1   Liver Function Tests: No results for input(s): AST, ALT, ALKPHOS, BILITOT, PROT, ALBUMIN in the last 168 hours. No results for input(s): LIPASE, AMYLASE in the last 168 hours. No results for input(s): AMMONIA in the last 168 hours. CBC: Recent Labs  Lab 12/26/21 1318  WBC 10.0  HGB 12.2  HCT 35.8*  MCV 89.5  PLT 315   Cardiac Enzymes: No results for input(s): CKTOTAL, CKMB, CKMBINDEX, TROPONINI in the last 168 hours. BNP: Invalid input(s): POCBNP CBG: Recent Labs  Lab 12/26/21 2153 12/27/21 0623 12/27/21 1214 12/27/21 1600  GLUCAP 101* 94 121* 113*   D-Dimer No results for input(s): DDIMER in the last 72 hours. Hgb A1c No results for input(s): HGBA1C in the last 72 hours. Lipid Profile Recent Labs    12/27/21 0528  CHOL 133  HDL 38*  LDLCALC 66  TRIG 147  CHOLHDL 3.5   Thyroid function studies No results for input(s): TSH, T4TOTAL, T3FREE, THYROIDAB in the last 72 hours.  Invalid input(s): FREET3 Anemia work up No results for input(s): VITAMINB12,  FOLATE, FERRITIN, TIBC, IRON, RETICCTPCT in the last 72 hours. Urinalysis    Component Value Date/Time   COLORURINE YELLOW 08/29/2018 1221   APPEARANCEUR CLEAR 08/29/2018 1221   LABSPEC 1.018 08/29/2018 1221   PHURINE 6.0 08/29/2018 1221   GLUCOSEU 1+ (A) 08/29/2018 1221   HGBUR NEGATIVE 08/29/2018 1221   KETONESUR NEGATIVE 08/29/2018 1221   PROTEINUR NEGATIVE 08/29/2018 1221   NITRITE NEGATIVE 08/29/2018 1221   LEUKOCYTESUR NEGATIVE 08/29/2018 1221   Sepsis Labs Invalid input(s): PROCALCITONIN,  WBC,  LACTICIDVEN Microbiology Recent Results (from the past 240 hour(s))  Resp Panel by RT-PCR (Flu A&B, Covid) Nasopharyngeal Swab     Status: None   Collection Time: 12/26/21  5:50 PM   Specimen: Nasopharyngeal Swab; Nasopharyngeal(NP) swabs in vial transport medium  Result Value Ref Range Status   SARS Coronavirus 2 by RT PCR NEGATIVE NEGATIVE Final    Comment: (NOTE) SARS-CoV-2 target nucleic acids are NOT DETECTED.  The SARS-CoV-2 RNA is generally detectable in upper respiratory  specimens during the acute phase of infection. The lowest concentration of SARS-CoV-2 viral copies this assay can detect is 138 copies/mL. A negative result does not preclude SARS-Cov-2 infection and should not be used as the sole basis for treatment or other patient management decisions. A negative result may occur with  improper specimen collection/handling, submission of specimen other than nasopharyngeal swab, presence of viral mutation(s) within the areas targeted by this assay, and inadequate number of viral copies(<138 copies/mL). A negative result must be combined with clinical observations, patient history, and epidemiological information. The expected result is Negative.  Fact Sheet for Patients:  EntrepreneurPulse.com.au  Fact Sheet for Healthcare Providers:  IncredibleEmployment.be  This test is no t yet approved or cleared by the [place] FDA and   has been authorized for detection and/or diagnosis of SARS-CoV-2 by FDA under an Emergency Use Authorization (EUA). This EUA will remain  in effect (meaning this test can be used) for the duration of the COVID-19 declaration under Section 564(b)(1) of the Act, 21 U.S.C.section 360bbb-3(b)(1), unless the authorization is terminated  or revoked sooner.       Influenza A by PCR NEGATIVE NEGATIVE Final   Influenza B by PCR NEGATIVE NEGATIVE Final    Comment: (NOTE) The Xpert Xpress SARS-CoV-2/FLU/RSV plus assay is intended as an aid in the diagnosis of influenza from Nasopharyngeal swab specimens and should not be used as a sole basis for treatment. Nasal washings and aspirates are unacceptable for Xpert Xpress SARS-CoV-2/FLU/RSV testing.  Fact Sheet for Patients: EntrepreneurPulse.com.au  Fact Sheet for Healthcare Providers: IncredibleEmployment.be  This test is not yet approved or cleared by the [place] FDA and has been authorized for detection and/or diagnosis of SARS-CoV-2 by FDA under an Emergency Use Authorization (EUA). This EUA will remain in effect (meaning this test can be used) for the duration of the COVID-19 declaration under Section 564(b)(1) of the Act, 21 U.S.C. section 360bbb-3(b)(1), unless the authorization is terminated or revoked.  Performed at [facility], [address] [place], [place] [address]      Time coordinating discharge: Over 30 minutes  SIGNED:   Donnamarie Poag [place] DO  [facility] 12/27/2021, 5:02 PM

## 2021-12-27 NOTE — Assessment & Plan Note (Signed)
On repatha. On atenolol. On zetia. Should be on anti-platelet agent. plavix was stopped last year due to nose bleeds. Will start ASA 81 mg daily.

## 2021-12-27 NOTE — Assessment & Plan Note (Signed)
Add SSI. Check A1c. 

## 2021-12-27 NOTE — TOC Transition Note (Signed)
Transition of Care [facility]) - CM/SW Discharge Note   Patient Details  Name: Tara Campbell MRN: [id number] Date of Birth: 12/25/1945  [facility] CM/SW Contact:  Kermit Balo, RN Phone Number: 12/27/2021, 3:47 PM   Clinical Narrative:    Pt lives at home alone but sister lives close and her neighbor will also check on her.  Patient will discharge home with self care. No f/u per PT/OT.  Pt drives self and denies any issues with home medications.  Pt has transport home when discharged.    Final next level of care: Home/Self Care Barriers to Discharge: No Barriers Identified   Patient Goals and CMS Choice        Discharge Placement                       Discharge Plan and Services                                     Social Determinants of Health (SDOH) Interventions     Readmission Risk Interventions No flowsheet data found.

## 2021-12-27 NOTE — Progress Notes (Signed)
Echocardiogram 2D Echocardiogram has been performed.  Tara Campbell 12/27/2021, 11:41 AM

## 2021-12-27 NOTE — Assessment & Plan Note (Signed)
Stable. On atenolol for rate/rhythm control. Continue eliquis.

## 2021-12-27 NOTE — H&P (Signed)
History and Physical    Tara Campbell XAJ:[id number] DOB: Aug 14, 1946 DOA: 12/26/2021  PCP: Hali Marry, MD   Patient coming from: Home  I have personally briefly reviewed patient's old medical records in [place]  CC: TIA symptoms HPI: 76 year old white female with a history of type 2 diabetes, paroxysmal atrial fibrillation on Eliquis, history of coronary disease with obstructive LAD lesion that was unamenable to PCI last year, obesity, severe persistent asthma, hyperlipidemia presents to the ER today with 2 episodes of vision changes and left-sided weakness.  She states that first episode happened this past Sunday.  She was out to see a movie with her family.  She states that when she was in the parking lot, she started having visual changes.  She describes the vision like looking through a kaleidoscope.  She developed a severe headache.  She was slightly nauseous.  She developed weakness in her legs mostly her left side and some left-sided numbness in her arm.  Symptoms lasted about 45 minutes and then resolved spontaneously.  Today she was at the breakfast table with her grandsons.  She started having the same symptoms with vision changes then with a headache.  She describes some weakness in her legs.  She states that she sat down before she fell over.  She also complains of left-sided arm weakness and left leg weakness.  She came to the ER.  By the time she arrived to the ER, her symptoms had resolved.  CT the head was negative for acute intracranial abnormality.  Patient describes intermittent episodes with her left arm and left leg numbness.  She was scheduled to see neurology soon.  Patient [facility] for [facility]IA work-up.  Neuro stroke team was consulted by EDP.   ED Course: CT head negative. Symptoms resolved.  Review of Systems:  Review of Systems  Constitutional: Negative.  Negative for chills, fever and weight loss.  HENT: Negative.    Eyes:   Positive for blurred vision.  Respiratory: Negative.  Negative for cough, hemoptysis and sputum production.   Cardiovascular: Negative.  Negative for chest pain, palpitations and orthopnea.  Gastrointestinal: Negative.  Negative for heartburn, nausea and vomiting.  Genitourinary: Negative.  Negative for dysuria, frequency and urgency.  Musculoskeletal:  Negative for back pain.  Skin: Negative.   Neurological:  Positive for dizziness, focal weakness, weakness and headaches.  Endo/Heme/Allergies: Negative.   Psychiatric/Behavioral: Negative.    All other systems reviewed and are negative.  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 She has never used smokeless tobacco. She reports current alcohol use. She reports that she does not use drugs.  Allergies  Allergen Reactions   Latex Hives   Sulfa Antibiotics Rash    Family History  Problem Relation Age of Onset   Hypertension Mother    Hyperlipidemia Mother    Glaucoma Father    Heart disease Father    Hypertension Father    Diabetes Father    Hyperlipidemia Father    Skin cancer Father    Diabetes Paternal  Grandmother    Colon cancer Neg Hx    Esophageal cancer Neg Hx     Prior to Admission medications   Medication Sig Start Date End Date Taking? Authorizing Provider  acetaminophen (TYLENOL) 650 MG CR tablet Take 1 tablet (650 mg total) by mouth every 8 (eight) hours as needed for pain. 10/02/21   Silverio Decamp, MD  albuterol (VENTOLIN HFA) 108 (90 Base) MCG/ACT inhaler Inhale 2 puffs into the lungs every 6 (six) hours as needed for wheezing. 11/16/19   Hali Marry, MD  allopurinol (ZYLOPRIM) 300 MG tablet Take 1 tablet (300 mg total) by mouth 2 (two) times daily. 12/15/21   Hali Marry, MD  apixaban (ELIQUIS) 5 MG TABS tablet Take 1 tablet (5 mg total) by mouth 2 (two) times daily. 12/15/21   Hali Marry, MD  atenolol (TENORMIN) 100 MG tablet Take 1 tablet (100 mg total) by mouth daily. 09/06/21   Hali Marry, MD  atorvastatin (LIPITOR) 80 MG tablet Take 1 tablet (80 mg total) by mouth at bedtime. 12/15/21   Hali Marry, MD  blood glucose meter kit and supplies KIT Check morning fasting blood glucose daily and up to 4 times daily as directed 04/03/18   [name], PA-C  budesonide-formoterol [facility]) 80-4.5 MCG/ACT inhaler Inhale 2 puffs into the lungs in the morning and at bedtime. 10/31/21   Mannam, [place] Robinsons, MD  Cyanocobalamin (B-12 PO) Take 1,000 mcg by mouth daily.    [provider]  empagliflozin (JARDIANCE) 10 MG TABS tablet Take 1 tablet (10 mg total) by mouth daily before breakfast. 12/15/21   Hali Marry, MD  EPINEPHrine 0.3 mg/0.3 mL IJ SOAJ injection Inject 0.3 mg into the muscle as needed for anaphylaxis. Call 9-1-1 after use. 03/06/21   Mannam, [place] Robinsons, MD  Evolocumab with Infusor ([place]) 420 MG/3.5ML SOCT Inject 420 mg into the skin every 30 (thirty) days. 05/29/21   Lelon Perla, MD  ezetimibe (ZETIA) 10 MG tablet Take 1 tablet (10 mg total) by mouth daily. 12/15/21    Hali Marry, MD  fluticasone (FLONASE) 50 MCG/ACT nasal spray Place 1 spray into both nostrils daily. 08/25/18   [name], PA-C  glucose blood test strip To be used twice daily for testing blood sugars. E11.65 04/03/21   Hali Marry, MD  ipratropium-albuterol (DUONEB) 0.5-2.5 (3) MG/3ML SOLN Take 3 mLs by nebulization every 6 (six) hours as needed. 01/25/20   Hali Marry, MD  irbesartan-hydrochlorothiazide (AVALIDE) 300-12.5 MG tablet Take 0.5 tablets by mouth daily. 09/06/21   Hali Marry, MD  isosorbide mononitrate (IMDUR) 60 MG 24 hr tablet Take 1 tablet (60 mg total) by mouth daily. 12/15/21   Hali Marry, MD  levothyroxine (SYNTHROID) 125 MCG tablet Take 1 tablet (125 mcg total) by mouth daily before breakfast. 12/08/21   Hali Marry, MD  Mepolizumab ([name]) 100 MG/ML SOAJ Inject  1 mL (100 mg total) into the skin every 28 (twenty-eight) days. 12/18/21   Mannam, [place] Robinsons, MD  metFORMIN (GLUCOPHAGE) 500 MG tablet TAKE TWO TABLETS BY MOUTH TWICE DAILY WITH A MEAL 10/23/21   Hali Marry, MD  nitroGLYCERIN (NITROSTAT) 0.4 MG SL tablet Place 1 tablet (0.4 mg total) under the tongue every 5 (five) minutes as needed for chest pain (or tightness). 04/03/21   Hali Marry, MD  Omega-3 Fatty Acids (FISH OIL PO) Take 1,000 Units by mouth daily.    [provider]  Semaglutide, 2 MG/DOSE, 8 MG/3ML SOPN Inject 2 mg as directed once a week. 12/07/21   Hali Marry, MD  VITAMIN D, CHOLECALCIFEROL, PO Take 1 tablet by mouth daily in the afternoon.    [provider]    Physical Exam: Vitals:   12/26/21 1600 12/26/21 1926 12/26/21 2159 12/27/21 0009  BP: 137/67 (!) 145/72 (!) 123/59 135/75  Pulse: 75 85 73 75  Resp: '19 16 14 16  ' Temp:   98.1 F (36.7 C) (!) 97.5 F (36.4 C)  TempSrc:   Oral Oral  SpO2: 98% 93% 97% 99%  Weight:      Height:        Physical Exam Vitals and nursing note  reviewed.  Constitutional:      General: She is not in acute distress.    Appearance: Normal appearance. She is obese. She is not ill-appearing, toxic-appearing or diaphoretic.  HENT:     Head: Normocephalic and atraumatic.     Nose: Nose normal. No rhinorrhea.  Eyes:     General: No scleral icterus. Cardiovascular:     Rate and Rhythm: Normal rate and regular rhythm.     Pulses: Normal pulses.     Heart sounds: No murmur heard. Pulmonary:     Effort: Pulmonary effort is normal. No respiratory distress.     Breath sounds: No wheezing or rales.  Abdominal:     General: Bowel sounds are normal. There is no distension.     Tenderness: There is no abdominal tenderness. There is no guarding or rebound.  Musculoskeletal:     Right lower leg: No edema.     Left lower leg: No edema.  Skin:    General: Skin is warm and dry.     Capillary Refill: Capillary refill takes less than 2 seconds.  Neurological:     General: No focal deficit present.     Mental Status: She is alert and oriented to person, place, and time.     Cranial Nerves: Cranial nerves 2-12 are intact.     Motor: Motor function is intact. No weakness.     Coordination: Coordination is intact. Romberg sign negative. Finger-Nose-Finger Test and Heel to [facility] Test normal. Rapid alternating movements normal.     Labs on Admission: I have personally reviewed following labs and imaging studies  CBC: Recent Labs  Lab 12/26/21 1318  WBC 10.0  HGB 12.2  HCT 35.8*  MCV 89.5  PLT 073   Basic Metabolic Panel: Recent Labs  Lab 12/26/21 1318  NA 137  K 3.9  CL 104  CO2 24  GLUCOSE 113*  BUN 14  CREATININE 0.90  CALCIUM 10.1   GFR: Estimated Creatinine Clearance: 54 mL/min (by C-G formula based on SCr of 0.9 mg/dL). Liver Function Tests: No results for input(s): AST, ALT, ALKPHOS, BILITOT, PROT, ALBUMIN in the last 168 hours. No results for input(s): LIPASE, AMYLASE in the last 168 hours. No results  for input(s):  AMMONIA in the last 168 hours. Coagulation Profile: No results for input(s): INR, PROTIME in the last 168 hours. Cardiac Enzymes: No results for input(s): CKTOTAL, CKMB, CKMBINDEX, TROPONINI in the last 168 hours. BNP (last 3 results) No results for input(s): PROBNP in the last 8760 hours. HbA1C: No results for input(s): HGBA1C in the last 72 hours. CBG: Recent Labs  Lab 12/26/21 2153  GLUCAP 101*   Lipid Profile: No results for input(s): CHOL, HDL, LDLCALC, TRIG, CHOLHDL, LDLDIRECT in the last 72 hours. Thyroid Function Tests: No results for input(s): TSH, T4TOTAL, FREET4, T3FREE, THYROIDAB in the last 72 hours. Anemia Panel: No results for input(s): VITAMINB12, FOLATE, FERRITIN, TIBC, IRON, RETICCTPCT in the last 72 hours. Urine analysis:    Component Value Date/Time   COLORURINE YELLOW 08/29/2018 [place] 08/29/2018 1221   LABSPEC 1.018 08/29/2018 1221   PHURINE 6.0 08/29/2018 1221   GLUCOSEU 1+ (A) 08/29/2018 1221   HGBUR NEGATIVE 08/29/2018 1221   KETONESUR NEGATIVE 08/29/2018 1221   PROTEINUR NEGATIVE 08/29/2018 1221   NITRITE NEGATIVE 08/29/2018 1221   LEUKOCYTESUR NEGATIVE 08/29/2018 1221    Radiological Exams on Admission: I have personally reviewed images DG Chest 2 View  Result Date: 12/26/2021 CLINICAL DATA:  Chest pain and tingling right arm. EXAM: CHEST - 2 VIEW COMPARISON:  04/25/2021 FINDINGS: The heart size and mediastinal contours are within normal limits. Both lungs are clear. The visualized skeletal structures are unremarkable. IMPRESSION: No active cardiopulmonary disease. Electronically Signed   By: Franchot Gallo M.D.   On: 12/26/2021 13:52   CT Head Wo Contrast  Result Date: 12/26/2021 CLINICAL DATA:  TIA, LEFT arm numbness. EXAM: CT HEAD WITHOUT CONTRAST TECHNIQUE: Contiguous axial images were obtained from the base of the skull through the vertex without intravenous contrast. RADIATION DOSE REDUCTION: This exam was performed  according to the departmental dose-optimization program which includes automated exposure control, adjustment of the mA and/or kV according to patient size and/or use of iterative reconstruction technique. COMPARISON:  MRI of the brain for October 14, 2021. FINDINGS: Brain: No evidence of hemorrhage, hydrocephalus, extra-axial collection or mass lesion/mass effect. Signs of atrophy and chronic microvascular ischemic change with slight worsening since previous imaging Vascular: No hyperdense vessel or unexpected calcification. Skull: Normal. Negative for fracture or focal lesion. Sinuses/Orbits: Partial opacification of sphenoid sinuses. Scattered opacification of ethmoid sinuses. Maxillary sinuses are not visualized in their entirety only limited imaging of the remainder of the sinuses and orbits. Other: None IMPRESSION: No acute intracranial abnormality. Signs of atrophy, chronic microvascular ischemic change and sinus disease. Electronically Signed   By: Zetta Bills M.D.   On: 12/26/2021 15:04    EKG: I have personally reviewed EKG: NSR    Assessment/Plan Principal Problem:   TIA (transient ischemic attack) Active Problems:   Severe persistent allergic asthma   Paroxysmal atrial fibrillation (HCC)   Coronary artery disease involving native coronary artery of native heart with angina pectoris (HCC)   Anticoagulant long-term use - on eliquis for PAF   Class 1 obesity due to excess calories with serious comorbidity in adult   Type 2 diabetes mellitus with diabetic chronic kidney disease (HCC)   Hyperlipidemia    TIA (transient ischemic attack) Admit to observation telemetry bed. Check lipid panel. Check echo. Check MRI/MRA brain. Pt has not had imaging of her neck vessels. Check MRA neck. Given pt's c/o of left arm weakness and left leg weakness during episodes, will also check MRI c-spine.  Pt has no back pain. Pt to be seen by stroke team. Pt is not allergic to ASA. Pt had her plavix stopped  last year by cardiology due to nosebleeds. Pt has obstructive LAD lesions. Not sure why she was not placed on ASA 81 when her plavix was stopped. Pt did not get PCI with her LHC last year.  Severe persistent allergic asthma On [name]. No issues or reactions.  Paroxysmal atrial fibrillation (HCC) Stable. On atenolol for rate/rhythm control. Continue eliquis.  Coronary artery disease involving native coronary artery of native heart with angina pectoris ([place]) On repatha. On atenolol. On zetia. Should be on anti-platelet agent. plavix was stopped last year due to nose bleeds. Will start ASA 81 mg daily.  Anticoagulant long-term use - on eliquis for PAF Continue Eliquis  Class 1 obesity due to excess calories with serious comorbidity in adult Chronic.  Type 2 diabetes mellitus with diabetic chronic kidney disease ([place]) Add SSI. Check A1c.  Hyperlipidemia On repatha and zetia and statin  DVT prophylaxis: Eliquis Code Status: DNR/DNI(Do NOT Intubate) verified with patient Family Communication: no family at bedside  Disposition Plan: return home  Consults called: EDP has consulted neurology  Admission status: Observation, Telemetry bed   Kristopher Oppenheim, DO [facility] 12/27/2021, 12:13 AM

## 2021-12-27 NOTE — Evaluation (Signed)
Speech Language Pathology Evaluation Patient Details Name: Tara Campbell MRN: [id number] DOB: [birth year] Today's Date: 12/27/2021 Time: 1202-1220 SLP Time Calculation (min) (ACUTE ONLY): 18 min  Problem List:  Patient Active Problem List   Diagnosis Date Noted   TIA (transient ischemic attack) 12/26/2021   Otitis media, left 11/06/2021   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 10/02/2021   BMI 33.0-33.9,adult 09/06/2021   Angina pectoris ([place]) 06/23/2021   Hyperlipidemia 04/27/2021   History of non-ST elevation myocardial infarction (NSTEMI) 04/25/2021   Hilar adenopathy 10/13/2018   Mediastinal adenopathy 10/13/2018   Dupuytren's contracture of left hand 08/28/2018   Trigger finger, left index finger 08/28/2018   Non-seasonal allergic rhinitis 08/26/2018   Type 2 diabetes mellitus with diabetic chronic kidney disease ([place]) 08/06/2018   Controlled diabetes mellitus type 2 with complications ([place]) [id number]   Class 1 obesity due to excess calories with serious comorbidity in adult 12/27/2017   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   Encounter for long-term (current) use of medications 11/28/2017   Anticoagulant long-term use - on eliquis for PAF 10/21/2017   Sensorineural hearing loss (SNHL) of both ears 09/27/2017   Hypertension goal BP (blood pressure) < 140/90 08/29/2017   Coronary artery disease involving native coronary artery of native heart with angina pectoris ([place]) 08/29/2017   Hearing difficulty of both ears 05/29/2017   Trochanteric bursitis, right hip 05/29/2017   Gout of ankle 04/08/2017   History of myocardial infarct at age greater than 27 years 01/23/2017   Mixed diabetic hyperlipidemia associated with type 2 diabetes mellitus ([place]) 01/23/2017   Acquired hypothyroidism 01/23/2017   Severe persistent allergic asthma 01/16/2017   Paroxysmal atrial fibrillation ([place]) 01/16/2017   Cardiomegaly 01/16/2017   Past Medical History:  Past Medical  History:  Diagnosis Date   Allergy    Arthritis    Asthma    Colon polyps    Coronary artery disease    Diabetes mellitus without complication ([place])    Gout    Heart attack ([place]) 07/30/2016   PCI, 2 stents   Hyperlipidemia    Hypertension    Internal hemorrhoids    Left rotator cuff tear    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   Otomycosis of right ear 09/27/2017   Paroxysmal atrial fibrillation ([facility])    Thyroid disease    Past Surgical History:  Past Surgical History:  Procedure Laterality Date   ANGIOPLASTY     CARDIAC CATHETERIZATION     COLONOSCOPY W/ POLYPECTOMY  01/04/2015   HEMORRHOID BANDING     LEFT HEART CATH AND CORONARY ANGIOGRAPHY N/A 04/26/2021   Procedure: LEFT HEART CATH AND CORONARY ANGIOGRAPHY;  Surgeon: Sherren Mocha, MD;  Location: [facility];  Service: Cardiovascular;  Laterality: N/A;   HPI:  Pt is a 76 year old female who presented to the ED with 2 episodes of vision changes and left-sided weakness. MRI negative for acute changes. PMH: type 2 diabetes, paroxysmal atrial fibrillation on Eliquis, history of coronary disease with obstructive LAD lesion that was unamenable to PCI last year, obesity, severe persistent asthma, hyperlipidemia.   Assessment / Plan / Recommendation Clinical Impression  Pt participated in speech/language/cognition evaluation with her sister present. 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 Per the pt, she lives alone and was independent PTA. Pt reported that at baseline she has been having difficulty with memory, thought "organization", and intermittent confusion. Pt stated that her mother had Alzheimer's and that she is  therefore concerned about her cognition. Motor speech and language skills were WNL. The [facility] Mental Status Examination was completed to evaluate the pt's cognitive-linguistic skills. She achieved a score of 18/30 which is below the normal  limits of 27 or more out of 30.  She exhibited deficits in the areas of memory, attention, temporal orientation, and executive function. Considering pt's history and concerns, it is also noteworthy that the pt's score falls within the range for individuals with dementia. Diagnosis of this is outside the scope of an SLP and further assessment is therefore recommended. Pt verbalized agreement with following up with neurology/neuropsych for further assessment regarding the etiology of her cognitive impairments.    SLP Assessment  SLP Recommendation/Assessment: All further Speech Lanaguage Pathology  needs can be addressed in the next venue of care SLP Visit Diagnosis: Cognitive communication deficit (R41.841)    Recommendations for follow up therapy are one component of a multi-disciplinary discharge planning process, led by the attending physician.  Recommendations may be updated based on patient status, additional functional criteria and insurance authorization.    Follow Up Recommendations  Outpatient SLP (pending neurology/neuropsych's determination on etiology.)    Assistance Recommended at Discharge  Intermittent Supervision/Assistance  Functional Status Assessment Patient has not had a recent decline in their functional status  Frequency and Duration           SLP Evaluation Cognition  Overall Cognitive Status: History of cognitive impairments - at baseline Arousal/Alertness: Awake/alert Year: 2023 Month: January Day of Week: Incorrect Attention: Focused;Sustained Focused Attention: Appears intact Sustained Attention: Appears intact Memory: Impaired Memory Impairment: Retrieval deficit (Immediate: 5/5; delayed: 4/5; with cues: 1/1) Awareness: Appears intact Executive Function: Sequencing;Organizing Sequencing: Impaired Sequencing Impairment: Verbal complex (clock drawing: 0/4) Organizing: Impaired Organizing Impairment: Verbal complex (backward digit span: 1/2)        Comprehension  Auditory Comprehension Overall Auditory Comprehension: Appears within functional limits for tasks assessed Yes/No Questions: Within Functional Limits Commands: Within Functional Limits Conversation: Complex    Expression Expression Primary Mode of Expression: Verbal Verbal Expression Overall Verbal Expression: Appears within functional limits for tasks assessed Initiation: No impairment Repetition: No impairment Naming: No impairment Pragmatics: No impairment Written Expression Dominant Hand: Right   Oral / Motor  Oral Motor/Sensory Function Overall Oral Motor/Sensory Function: Within functional limits Motor Speech Overall Motor Speech: Appears within functional limits for tasks assessed Respiration: Within functional limits Phonation: Normal Resonance: Within functional limits Articulation: Within functional limitis Intelligibility: Intelligible Motor Planning: Witnin functional limits Motor Speech Errors: Not applicable           Donika Butner I. Hardin Negus, [place], [place] Office number [phone] Pager [place] 12/27/2021, 4:16 PM

## 2021-12-27 NOTE — Care Management Obs Status (Signed)
MEDICARE OBSERVATION STATUS NOTIFICATION   Patient Details  Name: Tara Campbell MRN: [id number] Date of Birth: 06/11/1946   Medicare Observation Status Notification Given:  Yes    Kermit Balo, RN 12/27/2021, 3:46 PM

## 2021-12-27 NOTE — Progress Notes (Signed)
PROGRESS NOTE    Tara Campbell  [id number] DOB: 1946/06/10 DOA: 12/26/2021 PCP: Hali Marry, MD    Brief Narrative:  Tara Campbell is a 76 year old female with past medical history significant for type 2 diabetes mellitus, paroxysmal atrial fibrillation on Eliquis, CAD with obstructive LAD that was unamenable to PCI last year, severe persistent asthma, hyperlipidemia, obesity who presented to [facility] ED on 1/24 with 2 episodes of vision changes and left-sided weakness.  Initial episode this past Sunday, when she was out to see a movie with her family she was walking in the parking lot and started having visual changes.  Describes vision looking like through a kaleidoscope with a severe headache and associated nausea.  She developed weakness in her legs mostly left side with some left-sided numbness in her arm lasting approximately 45 minutes with spontaneous resolution.  Today, patient was poorly eating breakfast with her grandsons and started having the same symptoms with vision changes, followed by headache and weakness in her legs.  She also complained of left-sided arm weakness.  By the time she arrived to the ED, her symptoms again once resolved.   In the ED, temperature 97.8 F, HR 78, RR 18, BP 125/84, SPO2 99% on room air.  Sodium 137, potassium 3.9, chloride 104, CO2 24, glucose 113, BUN 14, creatinine 0.90.  High sensitive troponin 4> 3.  WBC 10.0, hemoglobin 12.2, platelets 315.  COVID-19 PCR negative.  Influenza A/B PCR negative.  Chest x-ray with no active cardiopulmonary disease process.  CT head without contrast with no acute acute intracranial abnormality, signs of atrophy with chronic microvascular ischemic change and sinus disease.  Neurology was consulted.  Patient was transferred to [facility] for further evaluation for concern of TIA versus complex migraine headache   Assessment & Plan:   Principal Problem:   TIA (transient ischemic  attack) Active Problems:   Severe persistent allergic asthma   Paroxysmal atrial fibrillation (HCC)   Coronary artery disease involving native coronary artery of native heart with angina pectoris (HCC)   Anticoagulant long-term use - on eliquis for PAF   Class 1 obesity due to excess calories with serious comorbidity in adult   Type 2 diabetes mellitus with diabetic chronic kidney disease ([place])   Hyperlipidemia   TIA 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  CT head without contrast unrevealing. MR brain without contrast with no emergent finding, age normal appearance of the brain, generalized sinusitis.  MRA head/neck with no emergent finding but with advanced atheromatosis disease with multiple high-grade narrowings, and no acute process or flow-limiting stenosis in the neck.  LDL 66.  Neurology was consulted and followed during hospital course. Seen by PT and OT with no recommendations.  Reevaluated with neurology with clinical presentation likely representing complex aided migraine headache with aura and recommend outpatient follow-up at [facility] headache clinic.  --TTE: Pending --continue aspirin and statin.   Cervical foraminal impingement MR C-spine notable for foraminal impingement left C3-4, C4-5, C6-7, and mild anterolisthesis at C4-5 and T1-2.  Patient currently asymptomatic, no focal weakness or paresthesias.  Patient has scheduled follow-up with neurosurgery plan for Monday, 01/01/2022.   Type 2 diabetes mellitus Continue home metformin 1000 mg p.o. daily, Jardiance 10 mg p.o. daily, semaglutide 2 mg injected weekly.  Outpatient follow-up with PCP.   Essential  hypertension CAD Continue atenolol 100 mg p.o. daily, isosorbide mononitrate 60 mg p.o. daily, irbesartan-HCTZ 300-12.5 mg  p.o. dail.    Severe persistent asthma Oxygenating well on room air, no respiratory distress.  Continue Symbicort.   Hyperlipidemia Continue atorvastatin 80 mg p.o. daily, Repatha monthly   Paroxysmal atrial fibrillation Continue atenolol 100 mg p.o. daily, Eliquis 5 mg p.o. twice daily.  Outpatient follow-up with cardiology.   Hypothyroidism Levothyroxine 125 mcg p.o. daily   DVT prophylaxis: SCD's Start: 12/27/21 0037 apixaban (ELIQUIS) tablet 5 mg    Code Status: DNR Family Communication: No family present at bedside this morning  Disposition Plan:  Level of care: Telemetry Medical Status is: Observation  The patient remains OBS appropriate and will d/c before 2 midnights.  Anticipate discharge home once echo results are back     Consultants:  Neurology  Procedures:  TTE: Pending  Antimicrobials:  None   Subjective: Patient seen examined at bedside, resting comfortably.  Sitting in bedside chair.  No complaints this morning.  Symptoms remain resolved.  Seen by neurology this morning and believes symptoms related to complex migraine versus TIA.  Awaiting TTE results.  No other complaints or concerns at this time.  Denies headache, no fever/chills/night sweats, no nausea/vomiting/diarrhea, no chest pain, no palpitations, no shortness of breath, no weakness, no fatigue, no paresthesias.  No acute events overnight per nurse staff.  Objective: Vitals:   12/27/21 0736 12/27/21 0740 12/27/21 1213 12/27/21 1542  BP: 100/72  114/62 (!) 99/55  Pulse: 72  71 73  Resp: 18  19 17   Temp: 97.8 F (36.6 C)  97.8 F (36.6 C) 98 F (36.7 C)  TempSrc: Oral  Oral Oral  SpO2: 96% 95% 97% 95%  Weight:      Height:        Intake/Output Summary (Last 24 hours) at 12/27/2021 1632 Last data filed at 12/27/2021 0550 Gross per 24 hour  Intake 240 ml  Output 400 ml  Net -160 ml   Filed Weights   12/26/21 1313  Weight: 84.8 kg    Examination:  General exam: Appears calm and  comfortable  Respiratory system: Clear to auscultation. Respiratory effort normal.  On room air Cardiovascular system: S1 & S2 heard, RRR. No JVD, murmurs, rubs, gallops or clicks. No pedal edema. Gastrointestinal system: Abdomen is nondistended, soft and nontender. No organomegaly or masses felt. Normal bowel sounds heard. Central nervous system: Alert and oriented. No focal neurological deficits. Extremities: Symmetric 5 x 5 power. Skin: No rashes, lesions or ulcers Psychiatry: Judgement and insight appear normal. Mood & affect appropriate.     Data Reviewed: I have personally reviewed following labs and imaging studies  CBC: Recent Labs  Lab 12/26/21 1318  WBC 10.0  HGB 12.2  HCT 35.8*  MCV 89.5  PLT 123456   Basic Metabolic Panel: Recent Labs  Lab 12/26/21 1318  NA 137  K 3.9  CL 104  CO2 24  GLUCOSE 113*  BUN 14  CREATININE 0.90  CALCIUM 10.1   GFR: Estimated Creatinine Clearance: 54 mL/min (by C-G formula based on SCr of 0.9 mg/dL). Liver Function Tests: No results for input(s): AST, ALT, ALKPHOS, BILITOT, PROT, ALBUMIN in the last 168 hours. No results for input(s): LIPASE, AMYLASE in the last 168 hours. No results for input(s): AMMONIA in the last 168 hours. Coagulation Profile: No results for input(s): INR, PROTIME in the last 168 hours. Cardiac Enzymes: No results for input(s): CKTOTAL, CKMB, CKMBINDEX, TROPONINI in the last  168 hours. BNP (last 3 results) No results for input(s): PROBNP in the last 8760 hours. HbA1C: No results for input(s): HGBA1C in the last 72 hours. CBG: Recent Labs  Lab 12/26/21 2153 12/27/21 0623 12/27/21 1214 12/27/21 1600  GLUCAP 101* 94 121* 113*   Lipid Profile: Recent Labs    12/27/21 0528  CHOL 133  HDL 38*  LDLCALC 66  TRIG 147  CHOLHDL 3.5   Thyroid Function Tests: No results for input(s): TSH, T4TOTAL, FREET4, T3FREE, THYROIDAB in the last 72 hours. Anemia Panel: No results for input(s): VITAMINB12,  FOLATE, FERRITIN, TIBC, IRON, RETICCTPCT in the last 72 hours. Sepsis Labs: No results for input(s): PROCALCITON, LATICACIDVEN in the last 168 hours.  Recent Results (from the past 240 hour(s))  Resp Panel by RT-PCR (Flu A&B, Covid) Nasopharyngeal Swab     Status: None   Collection Time: 12/26/21  5:50 PM   Specimen: Nasopharyngeal Swab; Nasopharyngeal(NP) swabs in vial transport medium  Result Value Ref Range Status   SARS Coronavirus 2 by RT PCR NEGATIVE NEGATIVE Final    Comment: (NOTE) SARS-CoV-2 target nucleic acids are NOT DETECTED.  The SARS-CoV-2 RNA is generally detectable in upper respiratory specimens during the acute phase of infection. The lowest concentration of SARS-CoV-2 viral copies this assay can detect is 138 copies/mL. A negative result does not preclude SARS-Cov-2 infection and should not be used as the sole basis for treatment or other patient management decisions. A negative result may occur with  improper specimen collection/handling, submission of specimen other than nasopharyngeal swab, presence of viral mutation(s) within the areas targeted by this assay, and inadequate number of viral copies(<138 copies/mL). A negative result must be combined with clinical observations, patient history, and epidemiological information. The expected result is Negative.  Fact Sheet for Patients:  EntrepreneurPulse.com.au  Fact Sheet for Healthcare Providers:  IncredibleEmployment.be  This test is no t yet approved or cleared by the [place] FDA and  has been authorized for detection and/or diagnosis of SARS-CoV-2 by FDA under an Emergency Use Authorization (EUA). This EUA will remain  in effect (meaning this test can be used) for the duration of the COVID-19 declaration under Section 564(b)(1) of the Act, 21 U.S.C.section 360bbb-3(b)(1), unless the authorization is terminated  or revoked sooner.       Influenza A by PCR  NEGATIVE NEGATIVE Final   Influenza B by PCR NEGATIVE NEGATIVE Final    Comment: (NOTE) The Xpert Xpress SARS-CoV-2/FLU/RSV plus assay is intended as an aid in the diagnosis of influenza from Nasopharyngeal swab specimens and should not be used as a sole basis for treatment. Nasal washings and aspirates are unacceptable for Xpert Xpress SARS-CoV-2/FLU/RSV testing.  Fact Sheet for Patients: EntrepreneurPulse.com.au  Fact Sheet for Healthcare Providers: IncredibleEmployment.be  This test is not yet approved or cleared by the [place] FDA and has been authorized for detection and/or diagnosis of SARS-CoV-2 by FDA under an Emergency Use Authorization (EUA). This EUA will remain in effect (meaning this test can be used) for the duration of the COVID-19 declaration under Section 564(b)(1) of the Act, 21 U.S.C. section 360bbb-3(b)(1), unless the authorization is terminated or revoked.  Performed at [facility], [address] [place], [place] [address]          Radiology Studies: DG Chest 2 View  Result Date: 12/26/2021 CLINICAL DATA:  Chest pain and tingling right arm. EXAM: CHEST - 2 VIEW COMPARISON:  04/25/2021 FINDINGS: The heart size and mediastinal contours are  within normal limits. Both lungs are clear. The visualized skeletal structures are unremarkable. IMPRESSION: No active cardiopulmonary disease. Electronically Signed   By: Franchot Gallo M.D.   On: 12/26/2021 13:52   CT Head Wo Contrast  Result Date: 12/26/2021 CLINICAL DATA:  TIA, LEFT arm numbness. EXAM: CT HEAD WITHOUT CONTRAST TECHNIQUE: Contiguous axial images were obtained from the base of the skull through the vertex without intravenous contrast. RADIATION DOSE REDUCTION: This exam was performed according to the departmental dose-optimization program which includes automated exposure control, adjustment of the mA and/or kV according to patient size and/or use of  iterative reconstruction technique. COMPARISON:  MRI of the brain for October 14, 2021. FINDINGS: Brain: No evidence of hemorrhage, hydrocephalus, extra-axial collection or mass lesion/mass effect. Signs of atrophy and chronic microvascular ischemic change with slight worsening since previous imaging Vascular: No hyperdense vessel or unexpected calcification. Skull: Normal. Negative for fracture or focal lesion. Sinuses/Orbits: Partial opacification of sphenoid sinuses. Scattered opacification of ethmoid sinuses. Maxillary sinuses are not visualized in their entirety only limited imaging of the remainder of the sinuses and orbits. Other: None IMPRESSION: No acute intracranial abnormality. Signs of atrophy, chronic microvascular ischemic change and sinus disease. Electronically Signed   By: Zetta Bills M.D.   On: 12/26/2021 15:04   MR ANGIO HEAD WO CONTRAST  Result Date: 12/27/2021 CLINICAL DATA:  Episode of left arm numbness EXAM: MRI HEAD WITHOUT CONTRAST MRA HEAD WITHOUT CONTRAST MRA NECK WITHOUT CONTRAST TECHNIQUE: Multiplanar, multiecho pulse sequences of the brain and surrounding structures were obtained without intravenous contrast. Angiographic images of the Circle of Willis were obtained using MRA technique without intravenous contrast. Angiographic images of the neck were obtained using MRA technique without intravenous contrast. Carotid stenosis measurements (when applicable) are obtained utilizing NASCET criteria, using the distal internal carotid diameter as the denominator. COMPARISON:  Head CT from yesterday and brain MRI 10/14/2021 FINDINGS: MRI HEAD FINDINGS Brain: No acute infarction, hemorrhage, hydrocephalus, extra-axial collection or mass lesion. Minor FLAIR hyperintensity in the cerebral white matter attributed to chronic small vessel ischemia, less than typical for age. Brain volume is normal Vascular: Preserved flow voids Skull and upper cervical spine: Normal marrow signal.  Sinuses/Orbits: Mucosal thickening which is generalized in the paranasal sinuses and progressed from comparison MRI. Bilateral cataract resection. Partial left mastoid opacification with negative nasopharynx, also seen on prior. MRA HEAD FINDINGS Extensive atheromatous disease, notable when compared to the age normal appearance of the brain. Atheromatous plaque affecting the bilateral carotid siphons with ~ 50% narrowing at the right anterior genu and advanced narrowing at the left anterior genu where there is flow gap. Additional moderate left-sided stenosis at the paraclinoid ICA segment. Early branching right MCA with moderate M1 stenosis. Diffuse atheromatous irregularity of MCA branches. A left M2 branch origin is stenotic with flow gap reformats. Small left A1 segment, likely developmental. Extensive atheromatous irregularity of the bilateral V4 segments with high-grade narrowing and flow gap on the left. Either atheromatous irregularity or 2 mm aneurysm is possible at the left V4 segment on source images. Atheromatous irregularity of the basilar with up to 50% narrowing. Fetal type right PCA flow. Atheromatous irregularity of the posterior cerebral arteries with high-grade stenosis and flow gaps at the upper branch on the right. Moderate narrowing at the left P3 segment. MRA NECK FINDINGS Antegrade flow in the carotid and vertebral arteries by time-of-flight technique. Unremarkable aortic arch with 3 vessel branching. Allowing for motion and flow artifact, there is atheromatous plaque at the  right more than left proximal ICA without evidence of flow reducing stenosis. Robust flow in the bilateral vertebral arteries. Due to artifact the vertebral origins are not assessed. Overall moderately degraded study due to motion artifact. IMPRESSION: Brain MRI: 1. No emergent finding.  Age normal appearance of the brain. 2. Generalized sinusitis which is increased from comparison November 2022. Intracranial MRA: 1. No  emergent finding but advanced atheromatous disease with multiple high-grade narrowings described above. 2. 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 Neck MRA: 1. Moderately degraded study due to motion artifact. 2. No indication of acute process or flow limiting stenosis. Electronically Signed   By: Jorje Guild M.D.   On: 12/27/2021 06:38   MR ANGIO NECK WO CONTRAST  Result Date: 12/27/2021 CLINICAL DATA:  Episode of left arm numbness EXAM: MRI HEAD WITHOUT CONTRAST MRA HEAD WITHOUT CONTRAST MRA NECK WITHOUT CONTRAST TECHNIQUE: Multiplanar, multiecho pulse sequences of the brain and surrounding structures were obtained without intravenous contrast. Angiographic images of the Circle of Willis were obtained using MRA technique without intravenous contrast. Angiographic images of the neck were obtained using MRA technique without intravenous contrast. Carotid stenosis measurements (when applicable) are obtained utilizing NASCET criteria, using the distal internal carotid diameter as the denominator. COMPARISON:  Head CT from yesterday and brain MRI 10/14/2021 FINDINGS: MRI HEAD FINDINGS Brain: No acute infarction, hemorrhage, hydrocephalus, extra-axial collection or mass lesion. Minor FLAIR hyperintensity in the cerebral white matter attributed to chronic small vessel ischemia, less than typical for age. Brain volume is normal Vascular: Preserved flow voids Skull and upper cervical spine: Normal marrow signal. Sinuses/Orbits: Mucosal thickening which is generalized in the paranasal sinuses and progressed from comparison MRI. Bilateral cataract resection. Partial left mastoid opacification with negative nasopharynx, also seen on prior. MRA HEAD FINDINGS Extensive atheromatous disease, notable when compared to the age normal appearance of the brain. Atheromatous plaque affecting the bilateral carotid siphons with ~ 50% narrowing at the right anterior genu and advanced narrowing at the  left anterior genu where there is flow gap. Additional moderate left-sided stenosis at the paraclinoid ICA segment. Early branching right MCA with moderate M1 stenosis. Diffuse atheromatous irregularity of MCA branches. A left M2 branch origin is stenotic with flow gap reformats. Small left A1 segment, likely developmental. Extensive atheromatous irregularity of the bilateral V4 segments with high-grade narrowing and flow gap on the left. Either atheromatous irregularity or 2 mm aneurysm is possible at the left V4 segment on source images. Atheromatous irregularity of the basilar with up to 50% narrowing. Fetal type right PCA flow. Atheromatous irregularity of the posterior cerebral arteries with high-grade stenosis and flow gaps at the upper branch on the right. Moderate narrowing at the left P3 segment. MRA NECK FINDINGS Antegrade flow in the carotid and vertebral arteries by time-of-flight technique. Unremarkable aortic arch with 3 vessel branching. Allowing for motion and flow artifact, there is atheromatous plaque at the right more than left proximal ICA without evidence of flow reducing stenosis. Robust flow in the bilateral vertebral arteries. Due to artifact the vertebral origins are not assessed. Overall moderately degraded study due to motion artifact. IMPRESSION: Brain MRI: 1. No emergent finding.  Age normal appearance of the brain. 2. Generalized sinusitis which is increased from comparison November 2022. Intracranial MRA: 1. No emergent finding but advanced atheromatous disease with multiple high-grade narrowings described above. 2. 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 Neck MRA: 1. Moderately degraded study due to motion  artifact. 2. No indication of acute process or flow limiting stenosis. Electronically Signed   By: Jorje Guild M.D.   On: 12/27/2021 06:38   MR BRAIN WO CONTRAST  Result Date: 12/27/2021 CLINICAL DATA:  Episode of left arm numbness EXAM: MRI  HEAD WITHOUT CONTRAST MRA HEAD WITHOUT CONTRAST MRA NECK WITHOUT CONTRAST TECHNIQUE: Multiplanar, multiecho pulse sequences of the brain and surrounding structures were obtained without intravenous contrast. Angiographic images of the Circle of Willis were obtained using MRA technique without intravenous contrast. Angiographic images of the neck were obtained using MRA technique without intravenous contrast. Carotid stenosis measurements (when applicable) are obtained utilizing NASCET criteria, using the distal internal carotid diameter as the denominator. COMPARISON:  Head CT from yesterday and brain MRI 10/14/2021 FINDINGS: MRI HEAD FINDINGS Brain: No acute infarction, hemorrhage, hydrocephalus, extra-axial collection or mass lesion. Minor FLAIR hyperintensity in the cerebral white matter attributed to chronic small vessel ischemia, less than typical for age. Brain volume is normal Vascular: Preserved flow voids Skull and upper cervical spine: Normal marrow signal. Sinuses/Orbits: Mucosal thickening which is generalized in the paranasal sinuses and progressed from comparison MRI. Bilateral cataract resection. Partial left mastoid opacification with negative nasopharynx, also seen on prior. MRA HEAD FINDINGS Extensive atheromatous disease, notable when compared to the age normal appearance of the brain. Atheromatous plaque affecting the bilateral carotid siphons with ~ 50% narrowing at the right anterior genu and advanced narrowing at the left anterior genu where there is flow gap. Additional moderate left-sided stenosis at the paraclinoid ICA segment. Early branching right MCA with moderate M1 stenosis. Diffuse atheromatous irregularity of MCA branches. A left M2 branch origin is stenotic with flow gap reformats. Small left A1 segment, likely developmental. Extensive atheromatous irregularity of the bilateral V4 segments with high-grade narrowing and flow gap on the left. Either atheromatous irregularity or 2 mm  aneurysm is possible at the left V4 segment on source images. Atheromatous irregularity of the basilar with up to 50% narrowing. Fetal type right PCA flow. Atheromatous irregularity of the posterior cerebral arteries with high-grade stenosis and flow gaps at the upper branch on the right. Moderate narrowing at the left P3 segment. MRA NECK FINDINGS Antegrade flow in the carotid and vertebral arteries by time-of-flight technique. Unremarkable aortic arch with 3 vessel branching. Allowing for motion and flow artifact, there is atheromatous plaque at the right more than left proximal ICA without evidence of flow reducing stenosis. Robust flow in the bilateral vertebral arteries. Due to artifact the vertebral origins are not assessed. Overall moderately degraded study due to motion artifact. IMPRESSION: Brain MRI: 1. No emergent finding.  Age normal appearance of the brain. 2. Generalized sinusitis which is increased from comparison November 2022. Intracranial MRA: 1. No emergent finding but advanced atheromatous disease with multiple high-grade narrowings described above. 2. 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 Neck MRA: 1. Moderately degraded study due to motion artifact. 2. No indication of acute process or flow limiting stenosis. Electronically Signed   By: Jorje Guild M.D.   On: 12/27/2021 06:38   MR CERVICAL SPINE WO CONTRAST  Result Date: 12/27/2021 CLINICAL DATA:  Cervical radiculopathy without red flag symptoms. Episode of left arm numbness lasting 2 hours EXAM: MRI CERVICAL SPINE WITHOUT CONTRAST TECHNIQUE: Multiplanar, multisequence MR imaging of the cervical spine was performed. No intravenous contrast was administered. COMPARISON:  None. FINDINGS: Alignment: Degenerative anterolisthesis at C4-5 and T1-2; 2-3 mm at T1-2. Vertebrae: No fracture, evidence of discitis, or bone  lesion. Cord: Normal signal and morphology. Posterior Fossa, vertebral arteries, paraspinal  tissues: Brain and sinuses reported separately. Disc levels: C2-3: Facet spurring asymmetric to the right with ankylosis. Mild right foraminal narrowing C3-4: Disc narrowing and bulging. Degenerative facet spurring on both sides. Advanced left foraminal stenosis. Mild right foraminal narrowing C4-5: Asymmetric left uncovertebral and facet spurring. Left foraminal stenosis. Patent canal and right foramen C5-6: Disc collapse with endplate and uncovertebral ridging eccentric to the right where there is also greater facet spurring. Bulging disc without cord compression. Advanced right foraminal impingement C6-7: Disc narrowing and bulging with left foraminal protrusion and uncovertebral spurring. Advanced left foraminal impingement. C7-T1:Mild facet spurring. IMPRESSION: 1. 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 2. Foraminal impingement on the symptomatic left side at C3-4, C4-5, and C6-7. 3. Right foraminal impingement at C5-6. 4. Diffusely patent spinal canal. Electronically Signed   By: Jorje Guild M.D.   On: 12/27/2021 08:09        Scheduled Meds:  allopurinol  300 mg Oral BID   apixaban  5 mg Oral BID   aspirin EC  81 mg Oral Daily   atenolol  100 mg Oral Daily   atorvastatin  80 mg Oral QHS   ezetimibe  10 mg Oral Daily   fluticasone  1 spray Each Nare Daily   insulin aspart  0-15 Units Subcutaneous TID WC   insulin aspart  0-5 Units Subcutaneous QHS   isosorbide mononitrate  60 mg Oral Daily   levothyroxine  125 mcg Oral QAC breakfast   mometasone-formoterol  2 puff Inhalation BID   Continuous Infusions:   LOS: 0 days    Time spent: 39 minutes spent on chart review, discussion with nursing staff, consultants, updating family and interview/physical exam; more than 50% of that time was spent in counseling and/or coordination of care.    Dafne Nield J [place] DO [facility] Available via Epic secure chat 7am-7pm After these hours,  please refer to coverage provider listed on [email or web address] 12/27/2021, 4:32 PM

## 2021-12-27 NOTE — Consult Note (Signed)
Neurology Consultation Reason for Consult: TIA Referring Physician: Roslynn Amble, R  CC: Left arm numbness  History is obtained from: Patient  HPI: Tara Campbell is a 76 y.o. female who presents with two episodes of left-sided paresthesia.  First happened last Sunday and was accompanied by a geometric pattern per sleep screener visual field.  She had a headache associated with this.  It resolved, and she was okay until today at which point she had recurrent visual aura, left-sided numbness.  This lasted for approximately 2 hours and once again improved.  She again had a headache, but denies photophobia.  Of note, when she was in her 65s, she does not remember getting severe headaches, but does remember that she had episodes of the same visual obscuration.  The first time, it lasted for several hours and she was sent to an ENT who suggested it may be related to her diet.  She then had it intermittently a few times per year for the next 5 to 6 years.  She has not had it in at least 30 years at this point.   LKW: Sunday tpa given?: no, out of window   ROS: A 14 point ROS was performed and is negative except as noted in the HPI.  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 She has never used smokeless tobacco. She reports current alcohol use. She reports that she does not use drugs.   Exam: Current vital signs: BP (!) 123/59 (BP Location: Left Arm)    Pulse 73    Temp 98.1 F (36.7 C) (Oral)    Resp 14    Ht 5\' 3"  (1.6 m)    Wt 84.8 kg    SpO2 97%    BMI 33.12 kg/m  Vital signs in last 24 hours: Temp:  [97.8 F (36.6 C)-98.1 F (36.7 C)] 98.1 F (36.7 C) (01/24 2159) Pulse Rate:  [73-85] 73 (01/24 2159) Resp:  [14-20] 14 (01/24 2159) BP: (116-145)/(59-87) 123/59 (01/24 2159) SpO2:  [93 %-99 %] 97 % (01/24 2159) Weight:  [84.8 kg] 84.8 kg (01/24 1313)   Physical Exam  Constitutional: Appears well-developed and well-nourished.  Psych: Affect appropriate to situation Eyes: No scleral injection HENT: No OP obstruction MSK: no joint deformities.  Cardiovascular: Normal rate and regular rhythm.  Respiratory: Effort normal, non-labored breathing GI: Soft.  No distension. There is no tenderness.  Skin: WDI  Neuro: Mental Status: Patient is awake, alert, oriented to person, place,  month, year, and situation. Patient is able to give a clear and coherent history. No signs of aphasia or neglect Cranial Nerves: II: Visual Fields are full. L pupil larger than right, both are reactive. III,IV, VI: EOMI without ptosis or diploplia.  V: Facial sensation is symmetric to temperature VII: Facial movement is symmetric.  VIII: hearing is intact to voice X: Uvula elevates symmetrically XI: Shoulder shrug is symmetric. XII: tongue is midline without atrophy or fasciculations.  Motor: Tone is normal. Bulk is normal. 5/5 strength was present in all four extremities.  Sensory: Sensation is symmetric to light touch and temperature in the arms and legs. 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  Given the description of the episode she had previously, I think that this most likely represents complicated migraine, but especially given the duration since she had any symptoms and the fact that she had never had the left-sided numbness previously, I do think further evaluation is necessary.  Recommendations: 1) MRI brain, MRA head and neck 2) if the above is negative, no further inpatient recommendations    Roland Rack, MD [facility] [phone]  If 7pm- 7am, please page neurology on call as listed in [place].

## 2021-12-27 NOTE — Assessment & Plan Note (Addendum)
On repatha and zetia and statin

## 2021-12-27 NOTE — Plan of Care (Signed)
Received signout from Dr. Amada Jupiter who evaluated the patient overnight. Recommendations to follow MRI brain and MRA head and neck-both of which are unremarkable for acute process Clinical presentation likely secondary to complicated migraine-please see the initial consult note by Dr. Amada Jupiter. MR Cspine was also done which shows foraminal impingement on left C3-4, C4-5, C6-7 and right foraminal impingement at C5-6. There is also generalized Cspine degeneration and mild anterolisthesis at C4-5 and T1-2. Consider neurosurgical consult for the C-spine findings. Patient needs outpatient headache clinic follow-up at [facility] neurology. Inpatient neurology will be available as needed. Plan was relayed to Dr. [place] over secure chat  -- Milon Dikes, MD Neurologist [facility] Pager: [phone]

## 2021-12-27 NOTE — Assessment & Plan Note (Signed)
Admit to observation telemetry bed. Check lipid panel. Check echo. Check MRI/MRA brain. Pt has not had imaging of her neck vessels. Check MRA neck. Given pt's c/o of left arm weakness and left leg weakness during episodes, will also check MRI c-spine. Pt has no back pain. Pt to be seen by stroke team. Pt is not allergic to ASA. Pt had her plavix stopped last year by cardiology due to nosebleeds. Pt has obstructive LAD lesions. Not sure why she was not placed on ASA 81 when her plavix was stopped. Pt did not get PCI with her LHC last year.

## 2021-12-27 NOTE — Evaluation (Signed)
Occupational Therapy Evaluation Patient Details Name: Tara Campbell MRN: [id number] DOB: [birth year] Today's Date: 12/27/2021   History of Present Illness 76 year old white female who presented to the ED with vision changes, headache, nausea, and weakness in her left arm and legs (L>R). Her symptoms resolved spontaneously. Her work up revieled that she was found to have cervical impingement at multiple levels.  PMH: type 2 diabetes, paroxysmal atrial fibrillation on Eliquis, history of coronary disease with obstructive LAD lesion that was unamenable to PCI last year, obesity, severe persistent asthma, hyperlipidemia   Clinical Impression   Pt admitted for concerns listed above. PTA Pt reported that  she was independent with all  ADL's and IADL's, including driving. At this time, Pt demonstrates continued independence with all ADL's and functional mobility. Overall pt has good balance and needs no assist. BUE appear to have good coordination and strength. Pt has no further OT needs at this time and acute OT will sign off.      Recommendations for follow up therapy are one component of a multi-disciplinary discharge planning process, led by the attending physician.  Recommendations may be updated based on patient status, additional functional criteria and insurance authorization.   Follow Up Recommendations  No OT follow up    Assistance Recommended at Discharge PRN  Patient can return home with the following      Functional Status Assessment  Patient has had a recent decline in their functional status and demonstrates the ability to make significant improvements in function in a reasonable and predictable amount of time.  Equipment Recommendations  None recommended by OT    Recommendations for Other Services       Precautions / Restrictions Precautions Precautions: None Restrictions Weight Bearing Restrictions: No      Mobility Bed Mobility Overal bed mobility: Independent                   Transfers Overall transfer level: Independent Equipment used: None                      Balance Overall balance assessment: Independent                                         ADL either performed or assessed with clinical judgement   ADL Overall ADL's : Modified independent;At baseline                                       General ADL Comments: No difficulties, pt able to complete all tasks sitting and standing, safely and in a timely manner.     Vision Baseline Vision/History: 1 Wears glasses Ability to See in Adequate Light: 0 Adequate Patient Visual Report: No change from baseline Vision Assessment?: No apparent visual deficits Additional Comments: Pt reported originally having blurred vision, however reports that has cleared up. Tracking, functional acuity, peripherals, and convergence all WFL.     Perception     Praxis      Pertinent Vitals/Pain Pain Assessment Pain Assessment: No/denies pain     Hand Dominance Right   Extremity/Trunk Assessment Upper Extremity Assessment Upper Extremity Assessment: Overall WFL for tasks assessed   Lower Extremity Assessment Lower Extremity Assessment: Defer to PT evaluation   Cervical / Trunk Assessment Cervical / Trunk Assessment: Kyphotic  Communication Communication Communication: No difficulties   Cognition Arousal/Alertness: Awake/alert Behavior During Therapy: WFL for tasks assessed/performed Overall Cognitive Status: Within Functional Limits for tasks assessed                                       General Comments  VSS on RA    Exercises     Shoulder Instructions      Home Living Family/patient expects to be discharged to:: Private residence Living Arrangements: Alone Available Help at Discharge: Family Type of Home: House Home Access: Stairs to enter Technical brewer of Steps: 1 Entrance Stairs-Rails: None Home  Layout: One level     Bathroom Shower/Tub: Walk-in shower;Tub/shower unit   Bathroom Toilet: Handicapped height Bathroom Accessibility: Yes   Home Equipment: None   Additional Comments: sister who is a Marine scientist that lives 5 miles away from her residents.      Prior Functioning/Environment Prior Level of Function : Independent/Modified Independent                        OT Problem List: Decreased activity tolerance;Impaired balance (sitting and/or standing);Decreased strength      OT Treatment/Interventions:      OT Goals(Current goals can be found in the care plan section) Acute Rehab OT Goals Patient Stated Goal: To go homr OT Goal Formulation: With patient Time For Goal Achievement: 12/27/21 Potential to Achieve Goals: Good  OT Frequency:      Co-evaluation              AM-PAC OT "6 Clicks" Daily Activity     Outcome Measure Help from another person eating meals?: None Help from another person taking care of personal grooming?: None Help from another person toileting, which includes using toliet, bedpan, or urinal?: None Help from another person bathing (including washing, rinsing, drying)?: None Help from another person to put on and taking off regular upper body clothing?: None Help from another person to put on and taking off regular lower body clothing?: None 6 Click Score: 24   End of Session Nurse Communication: Mobility status  Activity Tolerance: Patient tolerated treatment well Patient left: in chair;with call bell/phone within reach  OT Visit Diagnosis: Unsteadiness on feet (R26.81);Other abnormalities of gait and mobility (R26.89);Muscle weakness (generalized) (M62.81)                Time: 1045-1100 OT Time Calculation (min): 15 min Charges:  OT General Charges $OT Visit: 1 Visit OT Evaluation $OT Eval Moderate Complexity: 1 Mod  Tobi Groesbeck H., OTR/L Acute Rehabilitation  Cordarrel Stiefel Elane Adryel Wortmann 12/27/2021, 3:02 PM

## 2021-12-27 NOTE — Subjective & Objective (Signed)
CC: TIA symptoms HPI: 76 year old white female with a history of type 2 diabetes, paroxysmal atrial fibrillation on Eliquis, history of coronary disease with obstructive LAD lesion that was unamenable to PCI last year, obesity, severe persistent asthma, hyperlipidemia presents to the ER today with 2 episodes of vision changes and left-sided weakness.  She states that first episode happened this past Sunday.  She was out to see a movie with her family.  She states that when she was in the parking lot, she started having visual changes.  She describes the vision like looking through a kaleidoscope.  She developed a severe headache.  She was slightly nauseous.  She developed weakness in her legs mostly her left side and some left-sided numbness in her arm.  Symptoms lasted about 45 minutes and then resolved spontaneously.  Today she was at the breakfast table with her grandsons.  She started having the same symptoms with vision changes then with a headache.  She describes some weakness in her legs.  She states that she sat down before she fell over.  She also complains of left-sided arm weakness and left leg weakness.  She came to the ER.  By the time she arrived to the ER, her symptoms had resolved.  CT the head was negative for acute intracranial abnormality.  Patient describes intermittent episodes with her left arm and left leg numbness.  She was scheduled to see neurology soon.  Patient transferred to [facility] for TIA work-up.  Neuro stroke team was consulted by EDP.

## 2021-12-27 NOTE — Plan of Care (Signed)
°  Problem: Education: Goal: Knowledge of General Education information will improve Description: Including pain rating scale, medication(s)/side effects and non-pharmacologic comfort measures Outcome: Adequate for Discharge   Problem: Health Behavior/Discharge Planning: Goal: Ability to manage health-related needs will improve Outcome: Adequate for Discharge   Problem: Clinical Measurements: Goal: Ability to maintain clinical measurements within normal limits will improve Outcome: Adequate for Discharge Goal: Will remain free from infection Outcome: Adequate for Discharge Goal: Diagnostic test results will improve Outcome: Adequate for Discharge Goal: Respiratory complications will improve Outcome: Adequate for Discharge Goal: Cardiovascular complication will be avoided Outcome: Adequate for Discharge   Problem: Activity: Goal: Risk for activity intolerance will decrease Outcome: Adequate for Discharge   Problem: Nutrition: Goal: Adequate nutrition will be maintained Outcome: Adequate for Discharge   Problem: Coping: Goal: Level of anxiety will decrease Outcome: Adequate for Discharge   Problem: Elimination: Goal: Will not experience complications related to bowel motility Outcome: Adequate for Discharge Goal: Will not experience complications related to urinary retention Outcome: Adequate for Discharge   Problem: Pain Managment: Goal: General experience of comfort will improve Outcome: Adequate for Discharge   Problem: Safety: Goal: Ability to remain free from injury will improve Outcome: Adequate for Discharge   Problem: Skin Integrity: Goal: Risk for impaired skin integrity will decrease Outcome: Adequate for Discharge   Problem: Education: Goal: Knowledge of disease or condition will improve Outcome: Adequate for Discharge Goal: Knowledge of secondary prevention will improve (SELECT ALL) Outcome: Adequate for Discharge Goal: Knowledge of patient specific  risk factors will improve (INDIVIDUALIZE FOR PATIENT) Outcome: Adequate for Discharge Goal: Individualized Educational Video(s) Outcome: Adequate for Discharge   Problem: Coping: Goal: Will identify appropriate support needs Outcome: Adequate for Discharge   Problem: Self-Care: Goal: Verbalization of feelings and concerns over difficulty with self-care will improve Outcome: Adequate for Discharge   Problem: Education: Goal: Knowledge of General Education information will improve Description: Including pain rating scale, medication(s)/side effects and non-pharmacologic comfort measures Outcome: Adequate for Discharge   Problem: Education: Goal: Knowledge of disease or condition will improve Outcome: Adequate for Discharge   Problem: Education: Goal: Knowledge of patient specific risk factors will improve (INDIVIDUALIZE FOR PATIENT) Outcome: Adequate for Discharge

## 2021-12-27 NOTE — Assessment & Plan Note (Signed)
-   Continue Eliquis 

## 2021-12-27 NOTE — Assessment & Plan Note (Signed)
On [name]. No issues or reactions.

## 2021-12-27 NOTE — Evaluation (Signed)
Physical Therapy Evaluation Patient Details Name: Tara Campbell MRN: [id number] DOB: June 16, 1946 Today's Date: 12/27/2021  History of Present Illness  76 year old white female who presented to the ED with vision changes, headache, nausea, and weakness in her left arm and legs (L>R). Her symptoms resolved spontaneously. Her work up revieled that she was found to have cervical impingement at multiple levels.  PMH: type 2 diabetes, paroxysmal atrial fibrillation on Eliquis, history of coronary disease with obstructive LAD lesion that was unamenable to PCI last year, obesity, severe persistent asthma, hyperlipidemia  Clinical Impression  Pt was able to mobilize, transfer, and ambulate independently during session. She was able to go up and down 4 stairs using the right railing for balance. Her DGI was performed and her score indicated that she is not at risk for falls. She is independent at home and reported feeling safe returning home. I she needs help, she can call on her sister who is near by. She is at baseline level of function, PT will sign off.     Recommendations for follow up therapy are one component of a multi-disciplinary discharge planning process, led by the attending physician.  Recommendations may be updated based on patient status, additional functional criteria and insurance authorization.  Follow Up Recommendations No PT follow up    Assistance Recommended at Discharge PRN  Patient can return home with the following  Other (comment) (no help needed)    Equipment Recommendations    Recommendations for Other Services       Functional Status Assessment Patient has not had a recent decline in their functional status     Precautions / Restrictions        Mobility  Bed Mobility Overal bed mobility: Independent                  Transfers Overall transfer level: Independent Equipment used: None                    Ambulation/Gait Ambulation/Gait  assistance: Independent Gait Distance (Feet): 350 Feet Assistive device: None Gait Pattern/deviations: WFL(Within Functional Limits)       General Gait Details: There was a mild gait deviation on her left leg from prior injury. she reports that deviation is baseline for her.  Stairs Stairs: Yes Stairs assistance: Independent Stair Management: One rail Right Number of Stairs: 4    Wheelchair Mobility    Modified Rankin (Stroke Patients Only)       Balance Overall balance assessment: Independent                               Standardized Balance Assessment Standardized Balance Assessment : Dynamic Gait Index   Dynamic Gait Index Level Surface: Normal Change in Gait Speed: Normal Gait with Horizontal Head Turns: Normal Gait with Vertical Head Turns: Normal Gait and Pivot Turn: Normal Step Over Obstacle: Normal Step Around Obstacles: Normal Steps: Mild Impairment Total Score: 23       Pertinent Vitals/Pain Pain Assessment Pain Assessment: No/denies pain    Home Living Family/patient expects to be discharged to:: Private residence Living Arrangements: Alone Available Help at Discharge: Family Type of Home: House Home Access: Stairs to enter Entrance Stairs-Rails: None Entrance Stairs-Number of Steps: 1   Home Layout: One level Home Equipment: None Additional Comments: sister who is a Marine scientist that lives 5 miles away from her residents.    Prior Function Prior Level of Function :  Independent/Modified Independent                     Hand Dominance   Dominant Hand: Right    Extremity/Trunk Assessment        Lower Extremity Assessment Lower Extremity Assessment: Overall WFL for tasks assessed       Communication   Communication: No difficulties  Cognition Arousal/Alertness: Awake/alert Behavior During Therapy: WFL for tasks assessed/performed Overall Cognitive Status: Within Functional Limits for tasks assessed                                           General Comments      Exercises     Assessment/Plan    PT Assessment Patient does not need any further PT services  PT Problem List         PT Treatment Interventions      PT Goals (Current goals can be found in the Care Plan section)       Frequency       Co-evaluation               AM-PAC PT "6 Clicks" Mobility  Outcome Measure Help needed turning from your back to your side while in a flat bed without using bedrails?: None Help needed moving from lying on your back to sitting on the side of a flat bed without using bedrails?: None Help needed moving to and from a bed to a chair (including a wheelchair)?: None Help needed standing up from a chair using your arms (e.g., wheelchair or bedside chair)?: None Help needed to walk in hospital room?: None Help needed climbing 3-5 steps with a railing? : None 6 Click Score: 24    End of Session Equipment Utilized During Treatment: Gait belt Activity Tolerance: Patient tolerated treatment well;No increased pain Patient left: in chair   PT Visit Diagnosis: Other symptoms and signs involving the nervous system (R29.898)    Time: 0940-1001 PT Time Calculation (min) (ACUTE ONLY): 21 min   Charges:   PT Evaluation $PT Eval Low Complexity: 1 Low          Quenton Fetter, SPT   Quenton Fetter 12/27/2021, 10:53 AM

## 2021-12-28 ENCOUNTER — Encounter: Payer: Self-pay | Admitting: Family Medicine

## 2021-12-28 ENCOUNTER — Telehealth: Payer: Self-pay | Admitting: Family Medicine

## 2021-12-28 NOTE — Telephone Encounter (Signed)
please call patient today and get her scheduled for a hospital follow-up in 1 week.  Also please see if they put her on antibiotics for sinusitis based on her scan?  If not, then I will send something to her pharmacy

## 2021-12-28 NOTE — Telephone Encounter (Signed)
LVM for patient to call back to get a hospital f/u scheduled and to see if they put her on antibiotics.

## 2021-12-28 NOTE — Telephone Encounter (Signed)
Patient returned call and has been scheduled for 01/03/22 at 10:50. Per patient she was not given an antibiotic and would rather wait to speak with Dr. Linford Arnold because she is not aware of why she would need this.

## 2022-01-01 ENCOUNTER — Other Ambulatory Visit: Payer: Self-pay

## 2022-01-01 ENCOUNTER — Ambulatory Visit: Payer: PPO | Admitting: Neurology

## 2022-01-01 ENCOUNTER — Encounter: Payer: Self-pay | Admitting: Neurology

## 2022-01-01 ENCOUNTER — Other Ambulatory Visit (INDEPENDENT_AMBULATORY_CARE_PROVIDER_SITE_OTHER): Payer: PPO

## 2022-01-01 VITALS — BP 126/76 | HR 84 | Ht 63.0 in | Wt 181.0 lb

## 2022-01-01 DIAGNOSIS — R202 Paresthesia of skin: Secondary | ICD-10-CM

## 2022-01-01 DIAGNOSIS — R413 Other amnesia: Secondary | ICD-10-CM

## 2022-01-01 LAB — VITAMIN B12: Vitamin B-12: 1029 pg/mL — ABNORMAL HIGH (ref 211–911)

## 2022-01-01 NOTE — Patient Instructions (Signed)
Nerve testing of the left arm and leg   Check vitamin B12 level  Neuropsychological testing   Follow-up after testing  ELECTROMYOGRAM AND NERVE CONDUCTION STUDIES (EMG/NCS) INSTRUCTIONS  How to Prepare The neurologist conducting the EMG will need to know if you have certain medical conditions. Tell the neurologist and other EMG lab personnel if you: Have a pacemaker or any other electrical medical device Take blood-thinning medications Have hemophilia, a blood-clotting disorder that causes prolonged bleeding Bathing Take a shower or bath shortly before your exam in order to remove oils from your skin. Dont apply lotions or creams before the exam.  What to Expect Youll likely be asked to change into a hospital gown for the procedure and lie down on an examination table. The following explanations can help you understand what will happen during the exam.  Electrodes. The neurologist or a technician places surface electrodes at various locations on your skin depending on where youre experiencing symptoms. Or the neurologist may insert needle electrodes at different sites depending on your symptoms.  Sensations. The electrodes will at times transmit a tiny electrical current that you may feel as a twinge or spasm. The needle electrode may cause discomfort or pain that usually ends shortly after the needle is removed. If you are concerned about discomfort or pain, you may want to talk to the neurologist about taking a short break during the exam.  Instructions. During the needle EMG, the neurologist will assess whether there is any spontaneous electrical activity when the muscle is at rest - activity that isnt present in healthy muscle tissue - and the degree of activity when you slightly contract the muscle.  He or she will give you instructions on resting and contracting a muscle at appropriate times. Depending on what muscles and nerves the neurologist is examining, he or she may ask you to  change positions during the exam.  After your EMG You may experience some temporary, minor bruising where the needle electrode was inserted into your muscle. This bruising should fade within several days. If it persists, contact your primary care doctor.

## 2022-01-01 NOTE — Progress Notes (Signed)
[facility] Clinic Note - Initial Visit   Date: 01/01/22  Tara Campbell MRN: [id number] DOB: Jan 13, 1946   Dear Dr. Madilyn Fireman:  Thank you for your kind referral of Tara Campbell for consultation of left side paresthesias. Although her history is well known to you, please allow [place] to reiterate it for the purpose of our medical record. The patient was accompanied to the clinic by sister, Tara Campbell, corporate) who also provides collateral information.     History of Present Illness: Tara Campbell is a 76 y.o. right-handed female with atrial fibrillation, hyperlipidemia (LDL 87) , hypertension, hypothyroidism, diabetes mellitus, CAD s/p PCI, and asthma presenting for evaluation of left sided paresthesias.   In May 2022, she was hospitalized with a heart attack and since this time, she feels that she has never been back to her prior health.  She is more forgetful, misplacing items, and has difficulty trying to complete tasks or organize things at home. She lives alone and manages her own medications. She does her own ADLs and IADLs. Mother had Alzheimer's dementia and she is very concerned about this.  In October 2022, she developed left leg numbness and weakness which lasted 5-10 minutes.   Symptoms self-resolved. Two weeks later, she developed tingling involving the left forearm and hand, lasting 5-10 minutes. It occurs 2-4 times per week and increasing in frequency.  She was admitted to to [facility] on 1/24 with left sided numbness/weakness and visual changes. Visual changes are described as if looking through a kaleidoscope.  It lasted about 10-min and resolved. She reports having visual changes have been ongoing for 50 + years, but she had not experienced this in a long time.  She had a severe migraine two days prior to her symptoms onset. MRI/A brain and MRA head did not show any significant findings to explain symptoms. MRI cervical spine shows foraminal impingement  at the left involving C3-4, C4-5,and C6-7.  She is retired from [employer].  Lives alone in a one-level home. Nonsmoker. She previously drank 5-6 liquor daily for much of her adult life, but quit this 20 years.   Out-side paper records, electronic medical record, and images have been reviewed where available and summarized as:  MRI brain,MRA head, MRA neck 12/27/2021: 1. No emergent finding.  Age normal appearance of the brain. 2. Generalized sinusitis which is increased from comparison November 2022.   Intracranial MRA: 1. No emergent finding but advanced atheromatous disease with multiple high-grade narrowings described above.  2. 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   Neck MRA: 1. Moderately degraded study due to motion artifact. 2. No indication of acute process or flow limiting stenosis.   MRI cervical spine wo contrast 12/27/2021: 1. 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 2. Foraminal impingement on the symptomatic left side at C3-4, C4-5, and C6-7. 3. Right foraminal impingement at C5-6. 4. Diffusely patent spinal canal.  Lab Results  Component Value Date   CHOL 133 12/27/2021   HDL 38 (L) 12/27/2021   LDLCALC 66 12/27/2021   TRIG 147 12/27/2021   CHOLHDL 3.5 12/27/2021       Lab Results  Component Value Date   HGBA1C 6.8 (H) 12/27/2021   Lab Results  Component Value Date   TSH 6.17 (H) 12/07/2021   Lab Results  Component Value Date   ESRSEDRATE 29 02/22/2020    Past Medical History:  Diagnosis Date   Allergy  Arthritis    Asthma    Colon polyps    Coronary artery disease    Diabetes mellitus without complication ([place])    Gout    Heart attack ([place]) 07/30/2016   PCI, 2 stents   Hyperlipidemia    Hypertension    Internal hemorrhoids    Left rotator cuff tear    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 [facility];  Service: Cardiovascular;  Laterality: N/A;     Medications:  Outpatient Encounter Medications as of 01/01/2022  Medication Sig   acetaminophen (TYLENOL) 650 MG CR tablet Take 1 tablet (650 mg total) by mouth every 8 (eight) hours as needed for pain.   albuterol (VENTOLIN HFA) 108 (90 Base) MCG/ACT inhaler Inhale 2 puffs into the lungs every 6 (six) hours as needed for wheezing.   allopurinol (ZYLOPRIM) 300 MG tablet Take 1 tablet (300 mg total) by mouth 2 (two) times daily.   apixaban (ELIQUIS) 5 MG TABS tablet Take 1 tablet (5 mg total) by mouth 2 (two) times daily.   aspirin EC 81 MG EC tablet Take 1 tablet (81 mg total) by mouth daily. Swallow whole.   atenolol (TENORMIN) 100 MG tablet Take 1 tablet (100 mg total) by mouth daily.   atorvastatin (LIPITOR) 80 MG tablet Take 1 tablet (80 mg total) by mouth at bedtime.   blood glucose meter kit and supplies KIT Check morning fasting blood glucose daily and up to 4 times daily as directed   budesonide-formoterol (SYMBICORT) 80-4.5 MCG/ACT inhaler Inhale 2 puffs into the lungs in the morning and at bedtime.   Cyanocobalamin (B-12 PO) Take 1,000 mcg by mouth daily.   empagliflozin (JARDIANCE) 10 MG TABS tablet Take 1 tablet (10 mg total) by mouth daily before breakfast.   EPINEPHrine 0.3 mg/0.3 mL IJ SOAJ injection Inject 0.3 mg into the muscle as needed for anaphylaxis. Call 9-1-1 after use.   Evolocumab with Infusor ([place]) 420 MG/3.5ML SOCT Inject 420 mg into the skin every 30 (thirty) days.   ezetimibe (ZETIA) 10 MG tablet Take 1 tablet (10 mg total) by mouth  daily.   fluticasone (FLONASE) 50 MCG/ACT nasal spray Place 1 spray into both nostrils daily.   glucose blood test strip To be used twice daily for testing blood sugars. E11.65   ipratropium-albuterol (DUONEB) 0.5-2.5 (3) MG/3ML SOLN Take 3 mLs by nebulization every 6 (six) hours as needed. (Patient taking differently: Take 3 mLs by nebulization every 6 (six) hours as needed (wheezing).)   irbesartan-hydrochlorothiazide (AVALIDE) 300-12.5 MG tablet Take 0.5 tablets by mouth daily.   isosorbide mononitrate (IMDUR) 60 MG 24 hr tablet Take 1 tablet (60 mg total) by mouth daily.   levothyroxine (SYNTHROID) 125 MCG tablet Take 1 tablet (125 mcg total) by mouth daily before breakfast.   Mepolizumab ([name]) 100 MG/ML SOAJ Inject 1 mL (100 mg total) into the skin every 28 (twenty-eight) days.   metFORMIN (GLUCOPHAGE) 500 MG tablet TAKE TWO TABLETS BY MOUTH TWICE DAILY WITH A MEAL (Patient taking differently: Take 1,000  mg by mouth 2 (two) times daily with a meal.)   nitroGLYCERIN (NITROSTAT) 0.4 MG SL tablet Place 1 tablet (0.4 mg total) under the tongue every 5 (five) minutes as needed for chest pain (or tightness).   Omega-3 Fatty Acids (FISH OIL PO) Take 1,000 Units by mouth daily.   Semaglutide, 2 MG/DOSE, 8 MG/3ML SOPN Inject 2 mg as directed once a week. (Patient taking differently: Inject 2 mg as directed once a week. Sunday)   Vitamin D, Cholecalciferol, 25 MCG (1000 UT) TABS Take 25 mcg by mouth daily in the afternoon.   No facility-administered encounter medications on file as of 01/01/2022.    Allergies:  Allergies  Allergen Reactions   Latex Hives   Sulfa Antibiotics Rash    Family History: Family History  Problem Relation Age of Onset   Hypertension Mother    Hyperlipidemia Mother    Alzheimer's disease Mother    Vascular Disease Mother    Glaucoma Father    Heart disease Father    Hypertension Father    Diabetes Father    Hyperlipidemia Father    Skin cancer Father     Diabetes Paternal Grandmother    Colon cancer Neg Hx    Esophageal cancer Neg Hx     Social History: Social History   Tobacco Use   Smoking status: Never   Smokeless tobacco: Never  Vaping Use   Vaping Use: Never used  Substance Use Topics   Alcohol use: Yes    Comment: Rare   Drug use: No   Social History   Social History Narrative   Lives alone. Likes to crafts and paint in her free time.      Right Handed    Lives in a one story home     Vital Signs:  BP 126/76    Pulse 84    Ht _0  (1.6 m)    Wt 181 lb (82.1 kg)    SpO2 96%    BMI 32.06 kg/m   Neurological Exam: MENTAL STATUS including orientation to time, place, person, recent and remote memory, attention span and concentration, language, and fund of knowledge is normal.  Speech is not dysarthric.  Montreal Cognitive Assessment  01/01/2022  Visuospatial/ Executive (0/5) 3  Naming (0/3) 3  Attention: Read list of digits (0/2) 2  Attention: Read list of letters (0/1) 1  Attention: Serial 7 subtraction starting at 100 (0/3) 3  Language: Repeat phrase (0/2) 2  Language : Fluency (0/1) 0  Abstraction (0/2) 2  Delayed Recall (0/5) 1  Orientation (0/6) 5  Total 22    CRANIAL NERVES: II:  No visual field defects.     III-IV-VI: Pupils equal round and reactive to light.  Normal conjugate, extra-ocular eye movements in all directions of gaze.  No nystagmus.  No ptosis.   V:  Normal facial sensation.    VII:  Normal facial symmetry and movements.   VIII:  Normal hearing and vestibular function.   IX-X:  Normal palatal movement.   XI:  Normal shoulder shrug and head rotation.   XII:  Normal tongue strength and range of motion, no deviation or fasciculation.  MOTOR:  No atrophy, fasciculations or abnormal movements.  No pronator drift.   Upper Extremity:  Right  Left  Deltoid  5/5   5/5   Biceps  5/5   5/5   Triceps  5/5   5/5   Wrist extensors  5/5   5/5   Wrist flexors  5/5   5/5   Finger extensors  5/5    5/5   Finger flexors  5/5   5/5   Dorsal interossei  5/5   5/5   Abductor pollicis  5/5   5/5   Tone ([place] scale)  0  0   Lower Extremity:  Right  Left  Hip flexors  5/5   5/5   Hip extensors  5/5   5/5   Adductor 5/5  5/5  Abductor 5/5  5/5  Knee flexors  5/5   5/5   Knee extensors  5/5   5/5   Dorsiflexors  5/5   5/5   Plantarflexors  5/5   5/5   Toe extensors  5/5   5/5   Toe flexors  5/5   5/5   Tone ([place]worth scale)  0  0   MSRs:  Right        Left                  brachioradialis 2+  2+  biceps 2+  2+  triceps 2+  2+  patellar 2+  2+  ankle jerk 2+  2+  Hoffman no  no  plantar response down  down   SENSORY:  Normal and symmetric perception of light touch, pinprick, vibration, and proprioception.  Romberg's sign absent.   COORDINATION/GAIT: Normal finger-to- nose-finger.  Intact rapid alternating movements bilaterally.  Gait narrow based and stable. Tandem and stressed gait intact.    IMPRESSION: Left arm and leg paresthesias. Stroke evaluation has been negative. Unlikely to have TIA while she is on optimal medical therapy including Eliquis. It's possible symptoms are migraine variants vs cervical / lumbar radiculopathy. MRI cervical spine shows left sided neural impingement which could explain her ongoing hand symptoms.   - I recommend NCS/EMG to better evaluate for peripheral nerve process - entrapment neuropathy vs radiculopathy. If there is radiculopathy, PT would be the next step.  - If left leg weakness/numbness continues, MRI lumbar spine wo contrast would be the next step Memory changes following myocardial infarction. Possible she may have overlapping mood disorder such as adjustment disorder vs dementia seems less likely given the acute onset of change following stressful event - Check vitamin B12 - Neuropsychological testing  Follow-up after testing  Total time spent reviewing records, interview, history/exam, documentation, counseling, and coordination  of care on day of encounter:  60 min   Thank you for allowing me to participate in patient's care.  If I can answer any additional questions, I would be pleased to do so.    Sincerely,    Carrolyn Hilmes K. Posey Pronto, DO

## 2022-01-02 ENCOUNTER — Ambulatory Visit: Payer: PPO | Admitting: Neurology

## 2022-01-02 DIAGNOSIS — R413 Other amnesia: Secondary | ICD-10-CM

## 2022-01-02 DIAGNOSIS — R202 Paresthesia of skin: Secondary | ICD-10-CM | POA: Diagnosis not present

## 2022-01-02 NOTE — Procedures (Signed)
[facility]  [address], [address]  [place], [place] [address] Tel: [phone] Fax:  [phone] Test Date:  01/02/2022  Patient: Tara Campbell DOB: 1946-02-16 Physician: Nita Sickle, DO  Sex: Female Height: 5\' 3"  Ref Phys: , DO  ID#: Nita Sickle   Technician:    Patient Complaints: This is a 76 year old female referred for evaluation of left arm and leg paresthesias.  NCV & EMG Findings: Extensive electrodiagnostic testing of the left upper and lower extremity shows:  Left median, ulnar, mixed palmar, sural, and superficial peroneal sensory responses are within normal limits. Left ulnar motor response shows slowed conduction velocity across the elbow (A Elbow-B Elbow, 40 m/s).  Left median, peroneal, and tibial motor responses are within normal limits.   Left tibial H reflex studies within normal limits.   There is no evidence of active or chronic motor axonal loss changes affecting any of the tested muscles.  Motor unit configuration and recruitment pattern is within normal limits.    Impression: Left ulnar neuropathy with slowing across the elbow, purely demyelinating, mild There is no evidence of a sensorimotor polyneuropathy, carpal tunnel syndrome, or a cervical/lumbosacral radiculopathy affecting the left side.   ___________________________ 62, DO    Nerve Conduction Studies Anti Sensory Summary Table   Stim Site NR Peak (ms) Norm Peak (ms) P-T Amp (V) Norm P-T Amp  Left Median Anti Sensory (2nd Digit)  32C  Wrist    3.0 <3.8 27.4 >10  Left Sup Peroneal Anti Sensory (Ant Lat Mall)  32C  12 cm    2.9 <4.6 7.1 >3  Left Sural Anti Sensory (Lat Mall)  32C  Calf    3.7 <4.6 5.4 >3  Left Ulnar Anti Sensory (5th Digit)  32C  Wrist    2.7 <3.2 10.3 >5   Motor Summary Table   Stim Site NR Onset (ms) Norm Onset (ms) O-P Amp (mV) Norm O-P Amp Site1 Site2 Delta-0 (ms) Dist (cm) Vel (m/s) Norm Vel (m/s)  Left Median Motor (Abd  Poll Brev)  32C  Wrist    3.1 <4.0 7.3 >5 Elbow Wrist 4.6 28.0 61 >50  Elbow    7.7  6.9         Left Peroneal Motor (Ext Dig Brev)  32C  Ankle    3.8 <6.0 4.0 >2.5 B Fib Ankle 7.6 32.0 42 >40  B Fib    11.4  3.7  Poplt B Fib 1.5 8.0 53 >40  Poplt    12.9  3.7         Left Tibial Motor (Abd Hall Brev)  32C  Ankle    3.8 <6.0 11.7 >4 Knee Ankle 8.0 36.0 45 >40  Knee    11.8  8.6         Left Ulnar Motor (Abd Dig Minimi)  32C  Wrist    2.4 <3.1 7.5 >7 B Elbow Wrist 3.5 20.0 57 >50  B Elbow    5.9  7.1  A Elbow B Elbow 2.5 10.0 40 >50  A Elbow    8.4  6.7          Comparison Summary Table   Stim Site NR Peak (ms) Norm Peak (ms) P-T Amp (V) Site1 Site2 Delta-P (ms) Norm Delta (ms)  Left Median/Ulnar Palm Comparison (Wrist - 8cm)  32C  Median Palm    1.8 <2.2 63.9 Median Palm Ulnar Palm 0.3   Ulnar Palm    1.5 <2.2 17.5  H Reflex Studies   NR H-Lat (ms) Lat Norm (ms) L-R H-Lat (ms)  Left Tibial (Gastroc)  32C     30.20 <35    EMG   Side Muscle Ins Act Fibs Psw Fasc Number Recrt Dur Dur. Amp Amp. Poly Poly. Comment  Left 1stDorInt Nml Nml Nml Nml Nml Nml Nml Nml Nml Nml Nml Nml N/A  Left AntTibialis Nml Nml Nml Nml Nml Nml Nml Nml Nml Nml Nml Nml N/A  Left Gastroc Nml Nml Nml Nml Nml Nml Nml Nml Nml Nml Nml Nml N/A  Left Flex Dig Long Nml Nml Nml Nml Nml Nml Nml Nml Nml Nml Nml Nml N/A  Left RectFemoris Nml Nml Nml Nml Nml Nml Nml Nml Nml Nml Nml Nml N/A  Left GluteusMed Nml Nml Nml Nml Nml Nml Nml Nml Nml Nml Nml Nml N/A  Left PronatorTeres Nml Nml Nml Nml Nml Nml Nml Nml Nml Nml Nml Nml N/A  Left Biceps Nml Nml Nml Nml Nml Nml Nml Nml Nml Nml Nml Nml N/A  Left Triceps Nml Nml Nml Nml Nml Nml Nml Nml Nml Nml Nml Nml N/A  Left Deltoid Nml Nml Nml Nml Nml Nml Nml Nml Nml Nml Nml Nml N/A  Left FlexCarpiUln Nml Nml Nml Nml Nml Nml Nml Nml Nml Nml Nml Nml N/A      Waveforms:

## 2022-01-02 NOTE — Progress Notes (Signed)
Follow-up Visit   Date: 01/02/22   Tara Campbell MRN: [id number] DOB: 22-Dec-1945   Interim History: Tara Campbell is a 76 y.o. right-handed Caucasian female with atrial fibrillation, hyperlipidemia (LDL 66) , hypertension, hypothyroidism, diabetes mellitus, CAD s/p PCI, and asthma  returning to the clinic for electrodiagnostic testing.  The patient was accompanied to the clinic by self.  She reports left leg paresthesias have occurred over 5 weeks and have since resolved.  She continues to have left hand numbness/tingling from the forearm into the hand.    Medications:  Current Outpatient Medications on File Prior to Visit  Medication Sig Dispense Refill   acetaminophen (TYLENOL) 650 MG CR tablet Take 1 tablet (650 mg total) by mouth every 8 (eight) hours as needed for pain. 90 tablet 3   albuterol (VENTOLIN HFA) 108 (90 Base) MCG/ACT inhaler Inhale 2 puffs into the lungs every 6 (six) hours as needed for wheezing. 18 g prn   allopurinol (ZYLOPRIM) 300 MG tablet Take 1 tablet (300 mg total) by mouth 2 (two) times daily. 180 tablet 1   apixaban (ELIQUIS) 5 MG TABS tablet Take 1 tablet (5 mg total) by mouth 2 (two) times daily. 180 tablet 1   aspirin EC 81 MG EC tablet Take 1 tablet (81 mg total) by mouth daily. Swallow whole. 30 tablet 2   atenolol (TENORMIN) 100 MG tablet Take 1 tablet (100 mg total) by mouth daily. 90 tablet 1   atorvastatin (LIPITOR) 80 MG tablet Take 1 tablet (80 mg total) by mouth at bedtime. 90 tablet 3   blood glucose meter kit and supplies KIT Check morning fasting blood glucose daily and up to 4 times daily as directed 1 each 0   budesonide-formoterol (SYMBICORT) 80-4.5 MCG/ACT inhaler Inhale 2 puffs into the lungs in the morning and at bedtime. 10.2 g 5   Cyanocobalamin (B-12 PO) Take 1,000 mcg by mouth daily.     empagliflozin (JARDIANCE) 10 MG TABS tablet Take 1 tablet (10 mg total) by mouth daily before breakfast. 90 tablet 1   EPINEPHrine 0.3  mg/0.3 mL IJ SOAJ injection Inject 0.3 mg into the muscle as needed for anaphylaxis. Call 9-1-1 after use. 1 each 2   Evolocumab with Infusor ([place]) 420 MG/3.5ML SOCT Inject 420 mg into the skin every 30 (thirty) days. 3.6 mL 11   ezetimibe (ZETIA) 10 MG tablet Take 1 tablet (10 mg total) by mouth daily. 90 tablet 3   fluticasone (FLONASE) 50 MCG/ACT nasal spray Place 1 spray into both nostrils daily. 16 g 3   glucose blood test strip To be used twice daily for testing blood sugars. E11.65 200 each 11   ipratropium-albuterol (DUONEB) 0.5-2.5 (3) MG/3ML SOLN Take 3 mLs by nebulization every 6 (six) hours as needed. (Patient taking differently: Take 3 mLs by nebulization every 6 (six) hours as needed (wheezing).) 3 mL PRN   irbesartan-hydrochlorothiazide (AVALIDE) 300-12.5 MG tablet Take 0.5 tablets by mouth daily. 45 tablet 1   isosorbide mononitrate (IMDUR) 60 MG 24 hr tablet Take 1 tablet (60 mg total) by mouth daily. 90 tablet 1   levothyroxine (SYNTHROID) 125 MCG tablet Take 1 tablet (125 mcg total) by mouth daily before breakfast. 90 tablet 0   Mepolizumab ([name]) 100 MG/ML SOAJ Inject 1 mL (100 mg total) into the skin every 28 (twenty-eight) days. 1 mL 4   metFORMIN (GLUCOPHAGE) 500 MG tablet TAKE TWO TABLETS BY MOUTH TWICE DAILY WITH A MEAL (Patient taking  differently: Take 1,000 mg by mouth 2 (two) times daily with a meal.) 360 tablet 1   nitroGLYCERIN (NITROSTAT) 0.4 MG SL tablet Place 1 tablet (0.4 mg total) under the tongue every 5 (five) minutes as needed for chest pain (or tightness). 30 tablet prn   Omega-3 Fatty Acids (FISH OIL PO) Take 1,000 Units by mouth daily.     Semaglutide, 2 MG/DOSE, 8 MG/3ML SOPN Inject 2 mg as directed once a week. (Patient taking differently: Inject 2 mg as directed once a week. Sunday) 9 mL 1   Vitamin D, Cholecalciferol, 25 MCG (1000 UT) TABS Take 25 mcg by mouth daily in the afternoon. 60 tablet    No current facility-administered  medications on file prior to visit.    Allergies:  Allergies  Allergen Reactions   Latex Hives   Sulfa Antibiotics Rash    Exam deferred  Data: NCS/EMG of the left arm and leg 01/02/2022: Left ulnar neuropathy with slowing across the elbow, purely demyelinating, mild There is no evidence of a sensorimotor polyneuropathy, carpal tunnel syndrome, or a cervical/lumbosacral radiculopathy affecting the left side.  IMPRESSION/PLAN: Left ulnar neuropathy across the elbow, mild.  Results discussed  - Strategies to minimize nerve compression/stretching discussed  - Limit elbow flexion when sleeping  2.  Left leg paresthesias  - No evidence of neuropathy or radiculopathy on EDX  - Reassurance provided, fortunately, symptoms have significantly improved   Thank you for allowing me to participate in patient's care.  If I can answer any additional questions, I would be pleased to do so.    Sincerely,    Lethia Donlon K. Posey Pronto, DO

## 2022-01-03 ENCOUNTER — Other Ambulatory Visit: Payer: Self-pay

## 2022-01-03 ENCOUNTER — Encounter: Payer: Self-pay | Admitting: Family Medicine

## 2022-01-03 ENCOUNTER — Ambulatory Visit (INDEPENDENT_AMBULATORY_CARE_PROVIDER_SITE_OTHER): Payer: PPO | Admitting: Family Medicine

## 2022-01-03 VITALS — BP 104/79 | HR 88 | Ht 63.0 in | Wt 179.0 lb

## 2022-01-03 DIAGNOSIS — N1831 Chronic kidney disease, stage 3a: Secondary | ICD-10-CM

## 2022-01-03 DIAGNOSIS — I252 Old myocardial infarction: Secondary | ICD-10-CM | POA: Diagnosis not present

## 2022-01-03 DIAGNOSIS — E1122 Type 2 diabetes mellitus with diabetic chronic kidney disease: Secondary | ICD-10-CM

## 2022-01-03 DIAGNOSIS — E678 Other specified hyperalimentation: Secondary | ICD-10-CM

## 2022-01-03 DIAGNOSIS — R413 Other amnesia: Secondary | ICD-10-CM | POA: Diagnosis not present

## 2022-01-03 DIAGNOSIS — I480 Paroxysmal atrial fibrillation: Secondary | ICD-10-CM

## 2022-01-03 NOTE — Assessment & Plan Note (Signed)
We discussed stopping the B12 completely.  She was started on 8 years ago from her prior PCP but does not really know if she was deficient or if there was another reason but she has been taking it for years.  We will plan to recheck the level again in about 3 months to see if it is in the normal range we can always keep an eye on it and make sure that its not going low again she has been on metformin for years.

## 2022-01-03 NOTE — Assessment & Plan Note (Signed)
A1c from about a week ago looked great at 6.8.  Continue current regimen.  Lab Results  Component Value Date   HGBA1C 6.8 (H) 12/27/2021

## 2022-01-03 NOTE — Assessment & Plan Note (Signed)
-   Continue Eliquis 

## 2022-01-03 NOTE — Assessment & Plan Note (Signed)
She does have an appointment in September for further evaluation.  We will also look towards the [place] area to see if we might be able to get her in sooner.  She is okay with waiting if need be though.  We can certainly keep an eye on things over the next several months and if something changes dramatically let me know.

## 2022-01-03 NOTE — Progress Notes (Signed)
Established Patient Office Visit  Subjective:  Patient ID: Tara Campbell, female    DOB: 14-Aug-1946  Age: 76 y.o. MRN: [id number]  CC:  Chief Complaint  Patient presents with   Hospital    Follow up for left sided weakness.    Discuss B12    Patient states her B12 levels were elevated at 1,029 drawn by her Neurologist. Patient would like to know if she should continue on her B12 supplement.     HPI Tara Campbell presents for hospital follow-up left-sided weakness.  Initially they thought that she may have had a TIA but after consultation with neurology recently they felt like it could have been a migraine variant.  Her symptoms have improved.  She did have a nerve conduction study yesterday showing some left ulnar neuropathy.  I recommended that she avoid flexing her arm at night when she sleeps.  There is little bit of demyelination evident on the testing.  Have also referred her for neuropsychiatric testing for memory impairment she has just noticed for a while may be at least a year having more difficulty concentrating staying organized and remembering short-term things.  They did do a [place] test her score was 18 in the hospital they did repeat it at her neurology visit.  She denies any hallucinations or significant change in mood.  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  She denies feeling depressed she has some days where she does struggle little bit more than others but does not feel like she is depressed.  They also checked B12 levels.  They were actually elevated she has been taking an over-the-counter B supplement for years.  Interestingly, for years she has had episodes where it almost looks like visually she is looking through a kaleidoscope that typically last about 10 minutes and in  fact she had been having similar episodes recently.  They were almost never followed by a headache though about a week ago she did have a pretty severe headache afterwards.  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   Otomycosis of right ear 09/27/2017   Paroxysmal atrial fibrillation ([place])    Thyroid disease     Past Surgical History:  Procedure Laterality Date   ANGIOPLASTY     CARDIAC CATHETERIZATION     COLONOSCOPY W/ POLYPECTOMY  01/04/2015   HEMORRHOID BANDING     LEFT HEART CATH AND CORONARY ANGIOGRAPHY N/A 04/26/2021   Procedure: LEFT HEART CATH AND CORONARY ANGIOGRAPHY;  Surgeon: Sherren Mocha, MD;  Location: [facility];  Service: Cardiovascular;  Laterality: N/A;    Family History  Problem Relation Age of Onset   Hypertension Mother    Hyperlipidemia Mother    Alzheimer's disease Mother    Vascular Disease Mother    Glaucoma Father    Heart disease Father    Hypertension Father    Diabetes Father    Hyperlipidemia Father  Skin cancer Father    Diabetes Paternal Grandmother    Colon cancer Neg Hx    Esophageal cancer Neg Hx     Social History   Socioeconomic History   Marital status: Divorced    Spouse name: Not on file   Number of children: 2   Years of education: 13.5   Highest education level: 12th grade  Occupational History    Comment: retired  Tobacco Use   Smoking status: Never   Smokeless tobacco: Never  Vaping Use   Vaping Use: Never used  Substance and Sexual Activity   Alcohol use: Yes    Comment: Rare   Drug use: No   Sexual activity: Not Currently  Other Topics Concern   Not on file  Social History Narrative   Lives alone.  Likes to crafts and paint in her free time.      Right Handed    Lives in a one story home    Social Determinants of Health   Financial Resource Strain: Low Risk    Difficulty of Paying Living Expenses: Not hard at all  Food Insecurity: No Food Insecurity   Worried About Charity fundraiser in the Last Year: Never true   Arboriculturist in the Last Year: Never true  Transportation Needs: No Transportation Needs   Lack of Transportation (Medical): No   Lack of Transportation (Non-Medical): No  Physical Activity: Inactive   Days of Exercise per Week: 0 days   Minutes of Exercise per Session: 0 min  Stress: No Stress Concern Present   Feeling of Stress : Not at all  Social Connections: Moderately Isolated   Frequency of Communication with Friends and Family: More than three times a week   Frequency of Social Gatherings with Friends and Family: More than three times a week   Attends Religious Services: Never   Marine scientist or Organizations: Yes   Attends Music therapist: More than 4 times per year   Marital Status: Divorced  Human resources officer Violence: Not At Risk   Fear of Current or Ex-Partner: No   Emotionally Abused: No   Physically Abused: No   Sexually Abused: No    Outpatient Medications Prior to Visit  Medication Sig Dispense Refill   acetaminophen (TYLENOL) 650 MG CR tablet Take 1 tablet (650 mg total) by mouth every 8 (eight) hours as needed for pain. 90 tablet 3   albuterol (VENTOLIN HFA) 108 (90 Base) MCG/ACT inhaler Inhale 2 puffs into the lungs every 6 (six) hours as needed for wheezing. 18 g prn   allopurinol (ZYLOPRIM) 300 MG tablet Take 1 tablet (300 mg total) by mouth 2 (two) times daily. 180 tablet 1   apixaban (ELIQUIS) 5 MG TABS tablet Take 1 tablet (5 mg total) by mouth 2 (two) times daily. 180 tablet 1   aspirin EC 81 MG EC tablet Take 1 tablet (81 mg total) by mouth daily. Swallow whole. 30 tablet 2   atenolol (TENORMIN) 100 MG  tablet Take 1 tablet (100 mg total) by mouth daily. 90 tablet 1   atorvastatin (LIPITOR) 80 MG tablet Take 1 tablet (80 mg total) by mouth at bedtime. 90 tablet 3   budesonide-formoterol (SYMBICORT) 80-4.5 MCG/ACT inhaler Inhale 2 puffs into the lungs in the morning and at bedtime. 10.2 g 5   Cyanocobalamin (B-12 PO) Take 1,000 mcg by mouth daily.     empagliflozin (JARDIANCE) 10 MG TABS tablet Take 1 tablet (10 mg  total) by mouth daily before breakfast. 90 tablet 1   EPINEPHrine 0.3 mg/0.3 mL IJ SOAJ injection Inject 0.3 mg into the muscle as needed for anaphylaxis. Call 9-1-1 after use. 1 each 2   Evolocumab with Infusor ([place]) 420 MG/3.5ML SOCT Inject 420 mg into the skin every 30 (thirty) days. 3.6 mL 11   ezetimibe (ZETIA) 10 MG tablet Take 1 tablet (10 mg total) by mouth daily. 90 tablet 3   fluticasone (FLONASE) 50 MCG/ACT nasal spray Place 1 spray into both nostrils daily. 16 g 3   glucose blood test strip To be used twice daily for testing blood sugars. E11.65 200 each 11   ipratropium-albuterol (DUONEB) 0.5-2.5 (3) MG/3ML SOLN Take 3 mLs by nebulization every 6 (six) hours as needed. (Patient taking differently: Take 3 mLs by nebulization every 6 (six) hours as needed (wheezing).) 3 mL PRN   irbesartan-hydrochlorothiazide (AVALIDE) 300-12.5 MG tablet Take 0.5 tablets by mouth daily. 45 tablet 1   isosorbide mononitrate (IMDUR) 60 MG 24 hr tablet Take 1 tablet (60 mg total) by mouth daily. 90 tablet 1   levothyroxine (SYNTHROID) 125 MCG tablet Take 1 tablet (125 mcg total) by mouth daily before breakfast. 90 tablet 0   Mepolizumab ([name]) 100 MG/ML SOAJ Inject 1 mL (100 mg total) into the skin every 28 (twenty-eight) days. 1 mL 4   metFORMIN (GLUCOPHAGE) 500 MG tablet TAKE TWO TABLETS BY MOUTH TWICE DAILY WITH A MEAL (Patient taking differently: Take 1,000 mg by mouth 2 (two) times daily with a meal.) 360 tablet 1   nitroGLYCERIN (NITROSTAT) 0.4 MG SL tablet Place 1  tablet (0.4 mg total) under the tongue every 5 (five) minutes as needed for chest pain (or tightness). 30 tablet prn   Omega-3 Fatty Acids (FISH OIL PO) Take 1,000 Units by mouth daily.     Semaglutide, 2 MG/DOSE, 8 MG/3ML SOPN Inject 2 mg as directed once a week. (Patient taking differently: Inject 2 mg as directed once a week. Sunday) 9 mL 1   vitamin B-12 (CYANOCOBALAMIN) 100 MCG tablet Take 100 mcg by mouth daily.     Vitamin D, Cholecalciferol, 25 MCG (1000 UT) TABS Take 25 mcg by mouth daily in the afternoon. 60 tablet    zinc gluconate 50 MG tablet Take 50 mg by mouth daily.     blood glucose meter kit and supplies KIT Check morning fasting blood glucose daily and up to 4 times daily as directed 1 each 0   No facility-administered medications prior to visit.    Allergies  Allergen Reactions   Latex Hives   Sulfa Antibiotics Rash    ROS Review of Systems    Objective:    Physical Exam Constitutional:      Appearance: Normal appearance. She is well-developed.  HENT:     Head: Normocephalic and atraumatic.  Cardiovascular:     Rate and Rhythm: Normal rate and regular rhythm.     Heart sounds: Normal heart sounds.  Pulmonary:     Effort: Pulmonary effort is normal.     Breath sounds: Normal breath sounds.  Skin:    General: Skin is warm and dry.  Neurological:     Mental Status: She is alert and oriented to person, place, and time.  Psychiatric:        Behavior: Behavior normal.    BP 104/79    Pulse 88    Ht 5' 3" (1.6 m)    Wt 179 lb (81.2 kg)  HC 16" (40.6 cm)    SpO2 97%    BMI 31.71 kg/m  Wt Readings from Last 3 Encounters:  01/03/22 179 lb (81.2 kg)  01/01/22 181 lb (82.1 kg)  12/26/21 186 lb 15.2 oz (84.8 kg)     Health Maintenance Due  Topic Date Due   COVID-19 Vaccine (5 - Booster for Pfizer series) 12/12/2021    There are no preventive care reminders to display for this patient.  Lab Results  Component Value Date   TSH 6.17 (H) 12/07/2021    Lab Results  Component Value Date   WBC 10.0 12/26/2021   HGB 12.2 12/26/2021   HCT 35.8 (L) 12/26/2021   MCV 89.5 12/26/2021   PLT 315 12/26/2021   Lab Results  Component Value Date   NA 137 12/26/2021   K 3.9 12/26/2021   CO2 24 12/26/2021   GLUCOSE 113 (H) 12/26/2021   BUN 14 12/26/2021   CREATININE 0.90 12/26/2021   BILITOT 0.4 09/04/2021   ALKPHOS 100 05/29/2017   AST 11 09/04/2021   ALT 12 09/04/2021   PROT 6.4 09/04/2021   ALBUMIN 4.2 05/29/2017   CALCIUM 10.1 12/26/2021   ANIONGAP 9 12/26/2021   EGFR 71 10/09/2021   Lab Results  Component Value Date   CHOL 133 12/27/2021   Lab Results  Component Value Date   HDL 38 (L) 12/27/2021   Lab Results  Component Value Date   LDLCALC 66 12/27/2021   Lab Results  Component Value Date   TRIG 147 12/27/2021   Lab Results  Component Value Date   CHOLHDL 3.5 12/27/2021   Lab Results  Component Value Date   HGBA1C 6.8 (H) 12/27/2021      Assessment & Plan:   Problem List Items Addressed This Visit       Cardiovascular and Mediastinum   Paroxysmal atrial fibrillation (HCC)    Continue Eliquis.        Endocrine   Type 2 diabetes mellitus with diabetic chronic kidney disease ([place])    A1c from about a week ago looked great at 6.8.  Continue current regimen.  Lab Results  Component Value Date   HGBA1C 6.8 (H) 12/27/2021           Other   Memory impairment    She does have an appointment in September for further evaluation.  We will also look towards the [place] area to see if we might be able to get her in sooner.  She is okay with waiting if need be though.  We can certainly keep an eye on things over the next several months and if something changes dramatically let me know.      Hypervitaminosis - Primary    We discussed stopping the B12 completely.  She was started on 8 years ago from her prior PCP but does not really know if she was deficient or if there was another reason but she has been  taking it for years.  We will plan to recheck the level again in about 3 months to see if it is in the normal range we can always keep an eye on it and make sure that its not going low again she has been on metformin for years.      History of non-ST elevation myocardial infarction (NSTEMI)    Blood pressure is well controlled today.  Also on Repatha.       No orders of the defined types were placed in this encounter.  Follow-up: No follow-ups on file.   I spent 40 minutes on the day of the encounter to include pre-visit record review, face-to-face time with the patient and post visit ordering of test.   Beatrice Lecher, MD

## 2022-01-03 NOTE — Telephone Encounter (Signed)
Received notification from  [facility] [place] regarding an approval for [name] patient assistance from 12/15/21 to 12/02/22.   Phone number: [phone]

## 2022-01-03 NOTE — Assessment & Plan Note (Signed)
Blood pressure is well controlled today.  Also on Repatha.

## 2022-01-04 NOTE — Telephone Encounter (Signed)
Error: Please disregard this encounter. 

## 2022-01-09 ENCOUNTER — Other Ambulatory Visit: Payer: Self-pay

## 2022-01-09 ENCOUNTER — Ambulatory Visit (HOSPITAL_COMMUNITY)
Admission: RE | Admit: 2022-01-09 | Discharge: 2022-01-09 | Disposition: A | Discharge status: Home / Self Care | Payer: PPO | Source: Ambulatory Visit | Attending: Cardiology | Admitting: Cardiology

## 2022-01-09 ENCOUNTER — Encounter (HOSPITAL_COMMUNITY): Payer: Self-pay | Admitting: Cardiology

## 2022-01-09 VITALS — BP 118/70 | HR 70 | Wt 181.0 lb

## 2022-01-09 DIAGNOSIS — I480 Paroxysmal atrial fibrillation: Secondary | ICD-10-CM | POA: Diagnosis not present

## 2022-01-09 DIAGNOSIS — Z955 Presence of coronary angioplasty implant and graft: Secondary | ICD-10-CM | POA: Diagnosis not present

## 2022-01-09 DIAGNOSIS — I10 Essential (primary) hypertension: Secondary | ICD-10-CM | POA: Diagnosis not present

## 2022-01-09 DIAGNOSIS — J45909 Unspecified asthma, uncomplicated: Secondary | ICD-10-CM | POA: Insufficient documentation

## 2022-01-09 DIAGNOSIS — Z7984 Long term (current) use of oral hypoglycemic drugs: Secondary | ICD-10-CM | POA: Insufficient documentation

## 2022-01-09 DIAGNOSIS — E119 Type 2 diabetes mellitus without complications: Secondary | ICD-10-CM | POA: Insufficient documentation

## 2022-01-09 DIAGNOSIS — I2510 Atherosclerotic heart disease of native coronary artery without angina pectoris: Secondary | ICD-10-CM

## 2022-01-09 DIAGNOSIS — I252 Old myocardial infarction: Secondary | ICD-10-CM | POA: Diagnosis not present

## 2022-01-09 DIAGNOSIS — Z7901 Long term (current) use of anticoagulants: Secondary | ICD-10-CM | POA: Diagnosis not present

## 2022-01-09 NOTE — Progress Notes (Signed)
Primary Care: Dr Linford Arnold  Primary Cardiologist: Dr Jens Som  HF Cardiology: Dr. Shirlee Latch  HPI: Tara Campbell is a 76 y.o. with a history of CAD, PAF, Asthma, HTN, DMII. Had previous PCI to RCA.   Admitted 04/2021 with NSTEMI. Had cath that showed severe complex stenosis of her LAD in the proximal mid and distal portion of the vessel.  Patent left circumflex with no significant stenosis, patent left main with no significant stenosis, patent RCA with RCA stents mild nonobstructive disease less than 50% stenosis and normal LVEDP.  Her LAD stenosis was unfavorable for PCI due to heavy calcification and marked tortuosity. There is also noted to be a small caliber vessel with likely poor targets for CABG.  Medical therapy was recommended.  Plavix stopped in 9/22 due to nose bleeds.   She was admitted in 1/23 with ?TIA => neurology evaluated, imaging showed no CVA.  Echo in 1/23 showed EF 60-65%, normal RV, IVC normal.   Patient returns for followup of CAD.  No complaints today.  Asthma is under better control.  No chest pain.  No exertional dyspnea.  No palpitations.    Labs (1/23): K 3.9, creatinine 0.9, LDL 66  ECG (personally reviewed): NSR, nonspecific T wave changes  PMH: 1. CAD: Remote PCI to RCA.  NSTEMI in 5/22, LHC showed severe complex stenosis proximal/mid/distal LAD with patent RCA stents.  LAD was unfavorable for PCI with marked tortuosity and small caliber, also poor target for CABG due to small size.  Medical management.  - Echo (1/23): EF 60-65%, normal RV, normal IVC.  2. Atrial fibrillation: Paroxysmal.  3. ?TIA in 1/23: Imaging negative.  4. HTN 5. Type 2 diabetes 6. Asthma 7. ABIs normal in 12/22 8. Gout 9. Hyperlipidemia  Review of Systems: All systems reviewed and negative except as per HPI.   Current Outpatient Medications  Medication Sig Dispense Refill   acetaminophen (TYLENOL) 650 MG CR tablet Take 1 tablet (650 mg total) by mouth every 8 (eight) hours as  needed for pain. 90 tablet 3   albuterol (VENTOLIN HFA) 108 (90 Base) MCG/ACT inhaler Inhale 2 puffs into the lungs every 6 (six) hours as needed for wheezing. 18 g prn   allopurinol (ZYLOPRIM) 300 MG tablet Take 1 tablet (300 mg total) by mouth 2 (two) times daily. 180 tablet 1   apixaban (ELIQUIS) 5 MG TABS tablet Take 1 tablet (5 mg total) by mouth 2 (two) times daily. 180 tablet 1   aspirin EC 81 MG EC tablet Take 1 tablet (81 mg total) by mouth daily. Swallow whole. 30 tablet 2   atenolol (TENORMIN) 100 MG tablet Take 1 tablet (100 mg total) by mouth daily. 90 tablet 1   atorvastatin (LIPITOR) 80 MG tablet Take 1 tablet (80 mg total) by mouth at bedtime. 90 tablet 3   budesonide-formoterol (SYMBICORT) 80-4.5 MCG/ACT inhaler Inhale 2 puffs into the lungs in the morning and at bedtime. 10.2 g 5   colchicine 0.6 MG tablet Take 0.6 mg by mouth daily.     Cyanocobalamin (B-12 PO) Take 1,000 mcg by mouth daily.     Dulaglutide (TRULICITY) 0.75 MG/0.5ML SOPN Inject 0.75 mg into the skin once a week.     empagliflozin (JARDIANCE) 10 MG TABS tablet Take 1 tablet (10 mg total) by mouth daily before breakfast. 90 tablet 1   EPINEPHrine 0.3 mg/0.3 mL IJ SOAJ injection Inject 0.3 mg into the muscle as needed for anaphylaxis. Call 9-1-1 after use. 1 each  2   Evolocumab with Infusor ([place]) 420 MG/3.5ML SOCT Inject 420 mg into the skin every 30 (thirty) days. 3.6 mL 11   ezetimibe (ZETIA) 10 MG tablet Take 1 tablet (10 mg total) by mouth daily. 90 tablet 3   fluticasone (FLONASE) 50 MCG/ACT nasal spray Place 1 spray into both nostrils daily. 16 g 3   glucose blood test strip To be used twice daily for testing blood sugars. E11.65 200 each 11   ipratropium-albuterol (DUONEB) 0.5-2.5 (3) MG/3ML SOLN Take 3 mLs by nebulization every 6 (six) hours as needed. 3 mL PRN   irbesartan-hydrochlorothiazide (AVALIDE) 300-12.5 MG tablet Take 0.5 tablets by mouth daily. 45 tablet 1   isosorbide  mononitrate (IMDUR) 60 MG 24 hr tablet Take 1 tablet (60 mg total) by mouth daily. 90 tablet 1   levothyroxine (SYNTHROID) 125 MCG tablet Take 1 tablet (125 mcg total) by mouth daily before breakfast. 90 tablet 0   Mepolizumab ([name]) 100 MG/ML SOAJ Inject 1 mL (100 mg total) into the skin every 28 (twenty-eight) days. 1 mL 4   metFORMIN (GLUCOPHAGE) 500 MG tablet Take 1,000 mg by mouth 2 (two) times daily with a meal.     nitroGLYCERIN (NITROSTAT) 0.4 MG SL tablet Place 1 tablet (0.4 mg total) under the tongue every 5 (five) minutes as needed for chest pain (or tightness). 30 tablet prn   Omega-3 Fatty Acids (FISH OIL PO) Take 1,000 Units by mouth daily.     Semaglutide, 2 MG/DOSE, 8 MG/3ML SOPN Inject 2 mg as directed once a week. 9 mL 1   vitamin B-12 (CYANOCOBALAMIN) 100 MCG tablet Take 100 mcg by mouth daily.     Vitamin D, Cholecalciferol, 25 MCG (1000 UT) TABS Take 25 mcg by mouth daily in the afternoon. 60 tablet    zinc gluconate 50 MG tablet Take 50 mg by mouth daily.     No current facility-administered medications for this encounter.    Allergies  Allergen Reactions   Latex Hives   Sulfa Antibiotics Rash      Social History   Socioeconomic History   Marital status: Divorced    Spouse name: Not on file   Number of children: 2   Years of education: 13.5   Highest education level: 12th grade  Occupational History    Comment: retired  Tobacco Use   Smoking status: Never   Smokeless tobacco: Never  Vaping Use   Vaping Use: Never used  Substance and Sexual Activity   Alcohol use: Yes    Comment: Rare   Drug use: No   Sexual activity: Not Currently  Other Topics Concern   Not on file  Social History Narrative   Lives alone. Likes to crafts and paint in her free time.      Right Handed    Lives in a one story home    Social Determinants of Health   Financial Resource Strain: Low Risk    Difficulty of Paying Living Expenses: Not hard at all  Food Insecurity:  No Food Insecurity   Worried About Charity fundraiser in the Last Year: Never true   Arboriculturist in the Last Year: Never true  Transportation Needs: No Transportation Needs   Lack of Transportation (Medical): No   Lack of Transportation (Non-Medical): No  Physical Activity: Inactive   Days of Exercise per Week: 0 days   Minutes of Exercise per Session: 0 min  Stress: No Stress Concern Present  Feeling of Stress : Not at all  Social Connections: Moderately Isolated   Frequency of Communication with Friends and Family: More than three times a week   Frequency of Social Gatherings with Friends and Family: More than three times a week   Attends Religious Services: Never   Marine scientist or Organizations: Yes   Attends Music therapist: More than 4 times per year   Marital Status: Divorced  Human resources officer Violence: Not At Risk   Fear of Current or Ex-Partner: No   Emotionally Abused: No   Physically Abused: No   Sexually Abused: No      Family History  Problem Relation Age of Onset   Hypertension Mother    Hyperlipidemia Mother    Alzheimer's disease Mother    Vascular Disease Mother    Glaucoma Father    Heart disease Father    Hypertension Father    Diabetes Father    Hyperlipidemia Father    Skin cancer Father    Diabetes Paternal Grandmother    Colon cancer Neg Hx    Esophageal cancer Neg Hx     Vitals:   01/09/22 0939  BP: 118/70  Pulse: 70  SpO2: 97%  Weight: 82.1 kg (181 lb)   Wt Readings from Last 3 Encounters:  01/09/22 82.1 kg (181 lb)  01/03/22 81.2 kg (179 lb)  01/01/22 82.1 kg (181 lb)    PHYSICAL EXAM: General: NAD Neck: No JVD, no thyromegaly or thyroid nodule.  Lungs: Clear to auscultation bilaterally with normal respiratory effort. CV: Nondisplaced PMI.  Heart regular S1/S2, no S3/S4, no murmur.  No peripheral edema.  No carotid bruit.  Normal pedal pulses.  Abdomen: Soft, nontender, no hepatosplenomegaly, no  distention.  Skin: Intact without lesions or rashes.  Neurologic: Alert and oriented x 3.  Psych: Normal affect. Extremities: No clubbing or cyanosis.  HEENT: Normal.   ASSESSMENT & PLAN: 1. CAD: Remote RCA PCI.  NSTEMI in 5/22, LHC showed severe complex stenosis proximal/mid/distal LAD with patent RCA stents.  LAD was unfavorable for PCI with marked tortuosity and small caliber, also poor target for CABG due to small size.  Medical management.  Plavix stopped 9/22 due to nose bleed.  If she has ischemic symptoms in future, could readdress attempt at LAD PCI.  - On Eliquis + ASA 81 (started after ?TIA in 1/23).  Would stop ASA in 3 months or so post-TIA.  - Continue atorvastatin 80 mg daily + Repatha + Zetia, good lipids in 1/23.  - Continue atenolol and Imdur.  2. Atrial fibrillation: Paroxysmal.  She is in NSR.  - Continue Eliquis.   3. HTN: BP controlled.  4. Diabetes: Continue Jardiance.   Followup in 4 months.   Loralie Champagne 01/09/2022

## 2022-01-09 NOTE — Patient Instructions (Signed)
Thank you for your visit today.  No changes to your medications.  Your physician recommends that you schedule a follow-up appointment in: 4 months  If you have any questions or concerns before your next appointment please send [place] a message through [facility] or call our office at [phone].    TO LEAVE A MESSAGE FOR THE NURSE SELECT OPTION 2, PLEASE LEAVE A MESSAGE INCLUDING: YOUR NAME DATE OF BIRTH CALL BACK NUMBER REASON FOR CALL**this is important as we prioritize the call backs  YOU WILL RECEIVE A CALL BACK THE SAME DAY AS LONG AS YOU CALL BEFORE 4:00 PM  At the [facility], you and your health needs are our priority. As part of our continuing mission to provide you with exceptional heart care, we have created designated Provider Care Teams. These Care Teams include your primary Cardiologist (physician) and Advanced Practice Providers (APPs- Physician Assistants and Nurse Practitioners) who all work together to provide you with the care you need, when you need it.   You may see any of the following providers on your designated Care Team at your next follow up: Dr Glori Bickers Dr Haynes Kerns, NP Lyda Jester, [place] [facility] [place], [place] Audry Riles, PharmD   Please be sure to bring in all your medications bottles to every appointment.

## 2022-02-21 ENCOUNTER — Ambulatory Visit: Payer: PPO | Admitting: Cardiology

## 2022-03-07 ENCOUNTER — Ambulatory Visit: Payer: PPO | Admitting: Family Medicine

## 2022-03-22 ENCOUNTER — Encounter: Payer: Self-pay | Admitting: Podiatry

## 2022-03-22 ENCOUNTER — Ambulatory Visit: Payer: PPO | Admitting: Podiatry

## 2022-03-22 ENCOUNTER — Ambulatory Visit (INDEPENDENT_AMBULATORY_CARE_PROVIDER_SITE_OTHER): Payer: PPO

## 2022-03-22 DIAGNOSIS — M19071 Primary osteoarthritis, right ankle and foot: Secondary | ICD-10-CM | POA: Diagnosis not present

## 2022-03-22 DIAGNOSIS — M19072 Primary osteoarthritis, left ankle and foot: Secondary | ICD-10-CM | POA: Diagnosis not present

## 2022-03-22 DIAGNOSIS — M79672 Pain in left foot: Secondary | ICD-10-CM

## 2022-03-22 DIAGNOSIS — M7731 Calcaneal spur, right foot: Secondary | ICD-10-CM | POA: Diagnosis not present

## 2022-03-22 DIAGNOSIS — M2041 Other hammer toe(s) (acquired), right foot: Secondary | ICD-10-CM

## 2022-03-22 DIAGNOSIS — M7732 Calcaneal spur, left foot: Secondary | ICD-10-CM | POA: Diagnosis not present

## 2022-03-22 DIAGNOSIS — M2042 Other hammer toe(s) (acquired), left foot: Secondary | ICD-10-CM

## 2022-03-22 DIAGNOSIS — M79671 Pain in right foot: Secondary | ICD-10-CM

## 2022-03-22 NOTE — Progress Notes (Signed)
?  Subjective:  ?Patient ID: Tara Campbell, female    DOB: 12-18-1945,   MRN: [id number] ? ?No chief complaint on file. ? ? ?76 y.o. female presents for concern of bilateral hammertoes. Relates they have been present for about 3 years. Relates the pain is not constant and dependent on the shoes she wears. Relates sharp shooting pain. Only thing that helps is changing shoes. She is a diabetic and her last A1c was  6.8  . Denies any other pedal complaints. Denies n/v/f/c.  ? ?PCP: Beatrice Lecher MD  ? ?Past Medical History:  ?Diagnosis Date  ? Allergy   ? Arthritis   ? Asthma   ? Colon polyps   ? Coronary artery disease   ? Diabetes mellitus without complication ([place])   ? Gout   ? Heart attack ([place]) 07/30/2016  ? PCI, 2 stents  ? Hyperlipidemia   ? Hypertension   ? Internal hemorrhoids   ? Left rotator cuff tear   ? 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  ? Otomycosis of right ear 09/27/2017  ? Paroxysmal atrial fibrillation (HCC)   ? Thyroid disease   ? ? ?Objective:  ?Physical Exam: ?Vascular: DP/PT pulses 2/4 bilateral. CFT <3 seconds. Normal hair growth on digits. No edema.  ?Skin. No lacerations or abrasions bilateral feet.  ?Musculoskeletal: MMT 5/5 bilateral lower extremities in DF, PF, Inversion and Eversion. Deceased ROM in DF of ankle joint. Bilateral hammered second digits and adductovarus of fifth digits. No pain to palaption.  ?Neurological: Sensation intact to light touch. Protective sensation intact.  ? ?Assessment:  ? ?1. Hammer toe of right foot   ?2. Hammer toe of left foot   ? ? ? ?Plan:  ?Patient was evaluated and treated and all questions answered. ?X-rays reviewed and discussed with patient. No acute fractures or dislocations noted.  Degenerative changes to the midfoot. Adductovarus of fifth digit bilateral with hammered second digits bilateral.  ?-X-rays reviewed with patient.  ?-Educated on hammertoes and treatment options  ?-Discussed padding including toe caps and crest pads.  ?-Discussed need  for potential surgery if pain does not improved.  ?-Patient to follow-up in one year for diabetic foot check.  ? ? ?Lorenda Peck, DPM  ? ? ?

## 2022-04-25 ENCOUNTER — Telehealth: Payer: Self-pay | Admitting: Family Medicine

## 2022-04-25 ENCOUNTER — Encounter: Payer: Self-pay | Admitting: Family Medicine

## 2022-04-25 MED ORDER — METFORMIN HCL 500 MG PO TABS
1000.0000 mg | ORAL_TABLET | Freq: Two times a day (BID) | ORAL | 3 refills | Status: DC
Start: 2022-04-25 — End: 2022-04-26

## 2022-04-25 NOTE — Telephone Encounter (Signed)
Metformin sent to pharmacy, yes okay to keep appointment with [facility] for vaccines.

## 2022-04-25 NOTE — Telephone Encounter (Signed)
Lease call patient and let her know that I received the note from her sister about traveling to [place].  Looking over her vaccination record she is up-to-date on her tetanus.  She is also up-to-date on her COVID-vaccine . Though new guidelines recommend that if its been 4 months since your last one and you are over 65 to get a new booster.  So I would recommend doing that before she travels again.    There is also a newer pneumonia vaccine called Prevnar 20 which is the latest and greatest.  Not that she has to get that before she leaves for travel but that is a consideration to go ahead and get that updated.    We could also consider giving her hepatitis A vaccine.  I recommend this for people who are traveling to third world countries.  The risk is low in [place] but it is an option for travel.  That something we can do here in the office if she would like to do so.  Also please remind her she is due for 37-month follow-up for her diabetes and so that we can recheck her blood work.

## 2022-04-25 NOTE — Telephone Encounter (Signed)
Patient has been scheduled for 2 appointments.

## 2022-04-26 ENCOUNTER — Ambulatory Visit (INDEPENDENT_AMBULATORY_CARE_PROVIDER_SITE_OTHER): Payer: PPO | Admitting: Sports Medicine

## 2022-04-26 ENCOUNTER — Ambulatory Visit (INDEPENDENT_AMBULATORY_CARE_PROVIDER_SITE_OTHER): Payer: PPO

## 2022-04-26 DIAGNOSIS — J4551 Severe persistent asthma with (acute) exacerbation: Secondary | ICD-10-CM | POA: Diagnosis not present

## 2022-04-26 DIAGNOSIS — I2510 Atherosclerotic heart disease of native coronary artery without angina pectoris: Secondary | ICD-10-CM | POA: Diagnosis not present

## 2022-04-26 DIAGNOSIS — M1811 Unilateral primary osteoarthritis of first carpometacarpal joint, right hand: Secondary | ICD-10-CM

## 2022-04-26 DIAGNOSIS — E1169 Type 2 diabetes mellitus with other specified complication: Secondary | ICD-10-CM | POA: Diagnosis not present

## 2022-04-26 DIAGNOSIS — M1A0790 Idiopathic chronic gout, unspecified ankle and foot, without tophus (tophi): Secondary | ICD-10-CM | POA: Diagnosis not present

## 2022-04-26 DIAGNOSIS — E118 Type 2 diabetes mellitus with unspecified complications: Secondary | ICD-10-CM

## 2022-04-26 DIAGNOSIS — J3089 Other allergic rhinitis: Secondary | ICD-10-CM | POA: Diagnosis not present

## 2022-04-26 DIAGNOSIS — J4541 Moderate persistent asthma with (acute) exacerbation: Secondary | ICD-10-CM

## 2022-04-26 DIAGNOSIS — I480 Paroxysmal atrial fibrillation: Secondary | ICD-10-CM

## 2022-04-26 DIAGNOSIS — Z79899 Other long term (current) drug therapy: Secondary | ICD-10-CM | POA: Diagnosis not present

## 2022-04-26 DIAGNOSIS — E1159 Type 2 diabetes mellitus with other circulatory complications: Secondary | ICD-10-CM | POA: Diagnosis not present

## 2022-04-26 DIAGNOSIS — E039 Hypothyroidism, unspecified: Secondary | ICD-10-CM

## 2022-04-26 DIAGNOSIS — I152 Hypertension secondary to endocrine disorders: Secondary | ICD-10-CM

## 2022-04-26 DIAGNOSIS — J4540 Moderate persistent asthma, uncomplicated: Secondary | ICD-10-CM | POA: Diagnosis not present

## 2022-04-26 DIAGNOSIS — E785 Hyperlipidemia, unspecified: Secondary | ICD-10-CM

## 2022-04-26 MED ORDER — BUDESONIDE-FORMOTEROL FUMARATE 80-4.5 MCG/ACT IN AERO: 2.0000 | INHALATION_SPRAY | Freq: Two times a day (BID) | RESPIRATORY_TRACT | 5 refills | Status: DC

## 2022-04-26 MED ORDER — IRBESARTAN-HYDROCHLOROTHIAZIDE 300-12.5 MG PO TABS: 0.5000 | ORAL_TABLET | Freq: Every day | ORAL | 1 refills | Status: DC

## 2022-04-26 MED ORDER — FISH OIL 1000 MG PO CAPS: 1.0000 | ORAL_CAPSULE | Freq: Every day | ORAL | 3 refills | Status: DC

## 2022-04-26 MED ORDER — IPRATROPIUM-ALBUTEROL 0.5-2.5 (3) MG/3ML IN SOLN: 3.0000 mL | Freq: Four times a day (QID) | RESPIRATORY_TRACT | 99 refills | Status: DC | PRN

## 2022-04-26 MED ORDER — NITROGLYCERIN 0.4 MG SL SUBL: 0.4000 mg | SUBLINGUAL_TABLET | SUBLINGUAL | 99 refills | Status: DC | PRN

## 2022-04-26 MED ORDER — LEVOTHYROXINE SODIUM 125 MCG PO TABS: 125.0000 ug | ORAL_TABLET | Freq: Every day | ORAL | 0 refills | Status: DC

## 2022-04-26 MED ORDER — EZETIMIBE 10 MG PO TABS
10.0000 mg | ORAL_TABLET | Freq: Every day | ORAL | 3 refills | Status: DC
Start: 2022-04-26 — End: 2023-03-05

## 2022-04-26 MED ORDER — SEMAGLUTIDE (2 MG/DOSE) 8 MG/3ML ~~LOC~~ SOPN: 2.0000 mg | PEN_INJECTOR | SUBCUTANEOUS | 1 refills | Status: DC

## 2022-04-26 MED ORDER — FLUTICASONE PROPIONATE 50 MCG/ACT NA SUSP: 1.0000 | Freq: Every day | NASAL | 3 refills | Status: DC

## 2022-04-26 MED ORDER — EPINEPHRINE 0.3 MG/0.3ML IJ SOAJ: 0.3000 mg | INTRAMUSCULAR | 2 refills | Status: DC | PRN

## 2022-04-26 MED ORDER — ALBUTEROL SULFATE HFA 108 (90 BASE) MCG/ACT IN AERS: 2.0000 | INHALATION_SPRAY | Freq: Four times a day (QID) | RESPIRATORY_TRACT | 99 refills | Status: DC | PRN

## 2022-04-26 MED ORDER — APIXABAN 5 MG PO TABS: 5.0000 mg | ORAL_TABLET | Freq: Two times a day (BID) | ORAL | 1 refills | Status: DC

## 2022-04-26 MED ORDER — ATORVASTATIN CALCIUM 80 MG PO TABS: 80.0000 mg | ORAL_TABLET | Freq: Every day | ORAL | 3 refills | Status: AC

## 2022-04-26 MED ORDER — METFORMIN HCL 500 MG PO TABS: 1000.0000 mg | ORAL_TABLET | Freq: Two times a day (BID) | ORAL | 3 refills | Status: DC

## 2022-04-26 MED ORDER — ALLOPURINOL 300 MG PO TABS: 300.0000 mg | ORAL_TABLET | Freq: Two times a day (BID) | ORAL | 1 refills | Status: DC

## 2022-04-26 MED ORDER — ISOSORBIDE MONONITRATE ER 60 MG PO TB24
60.0000 mg | ORAL_TABLET | Freq: Every day | ORAL | 1 refills | Status: DC
Start: 2022-04-26 — End: 2022-05-10

## 2022-04-26 MED ORDER — REPATHA PUSHTRONEX SYSTEM 420 MG/3.5ML ~~LOC~~ SOCT: 420.0000 mg | SUBCUTANEOUS | 11 refills | Status: DC

## 2022-04-26 MED ORDER — ATENOLOL 100 MG PO TABS: 100.0000 mg | ORAL_TABLET | Freq: Every day | ORAL | 1 refills | Status: DC

## 2022-04-26 MED ORDER — EMPAGLIFLOZIN 10 MG PO TABS: 10.0000 mg | ORAL_TABLET | Freq: Every day | ORAL | 1 refills | Status: DC

## 2022-04-26 NOTE — Assessment & Plan Note (Addendum)
Pleasant 76 year old female, recurrence of pain right first [facility], has tried coming up daily early next month. Initially responded to arthritis from Tylenol and conditioning exercises. Topical Voltaren. Unable to use NSAIDs due to a NOAC treatment. Today we did a first [facility] injection, return to see me 6 weeks as needed. Did request for prescriptions to be printed out.

## 2022-04-26 NOTE — Progress Notes (Signed)
    Procedures performed today:    Procedure: Real-time Ultrasound Guided injection of the right first [facility] Device: Samsung HS60  Verbal informed consent obtained.  Time-out conducted.  Noted no overlying erythema, induration, or other signs of local infection.  Skin prepped in a sterile fashion.  Local anesthesia: Topical Ethyl chloride.  With sterile technique and under real time ultrasound guidance: Noted arthritic joint, 1/2 cc kenalog 40, 1/2 cc lidocaine injected easily. Completed without difficulty  Advised to call if fevers/chills, erythema, induration, drainage, or persistent bleeding.  Images permanently stored and available for review in PACS.  Impression: Technically successful ultrasound guided injection.  Independent interpretation of notes and tests performed by another provider:   None.  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 Initially responded to arthritis from Tylenol and conditioning exercises. Topical Voltaren. Unable to use NSAIDs due to a NOAC treatment. Today we did a first [place] injection, return to see me 6 weeks as needed. Did request for prescriptions to be printed out.    ___________________________________________ Gwen Her. Dianah Field, M.D., ABFM., CAQSM. Primary Care and [place] Instructor of [employer] [facility] [employer]

## 2022-05-02 ENCOUNTER — Encounter: Payer: Self-pay | Admitting: Adult Health

## 2022-05-02 ENCOUNTER — Ambulatory Visit: Payer: PPO | Admitting: Adult Health

## 2022-05-02 DIAGNOSIS — J4541 Moderate persistent asthma with (acute) exacerbation: Secondary | ICD-10-CM

## 2022-05-02 DIAGNOSIS — J4540 Moderate persistent asthma, uncomplicated: Secondary | ICD-10-CM

## 2022-05-02 DIAGNOSIS — Z79899 Other long term (current) drug therapy: Secondary | ICD-10-CM | POA: Diagnosis not present

## 2022-05-02 DIAGNOSIS — J4551 Severe persistent asthma with (acute) exacerbation: Secondary | ICD-10-CM

## 2022-05-02 DIAGNOSIS — J4550 Severe persistent asthma, uncomplicated: Secondary | ICD-10-CM

## 2022-05-02 DIAGNOSIS — J3089 Other allergic rhinitis: Secondary | ICD-10-CM | POA: Diagnosis not present

## 2022-05-02 MED ORDER — BUDESONIDE-FORMOTEROL FUMARATE 80-4.5 MCG/ACT IN AERO: 2.0000 | INHALATION_SPRAY | Freq: Two times a day (BID) | RESPIRATORY_TRACT | 5 refills | Status: DC

## 2022-05-02 MED ORDER — EPINEPHRINE 0.3 MG/0.3ML IJ SOAJ: 0.3000 mg | INTRAMUSCULAR | 2 refills | Status: DC | PRN

## 2022-05-02 MED ORDER — PREDNISONE 20 MG PO TABS: 20.0000 mg | ORAL_TABLET | Freq: Every day | ORAL | 0 refills | Status: DC

## 2022-05-02 MED ORDER — IPRATROPIUM-ALBUTEROL 0.5-2.5 (3) MG/3ML IN SOLN: 3.0000 mL | Freq: Four times a day (QID) | RESPIRATORY_TRACT | 99 refills | Status: DC | PRN

## 2022-05-02 MED ORDER — ALBUTEROL SULFATE HFA 108 (90 BASE) MCG/ACT IN AERS: 2.0000 | INHALATION_SPRAY | Freq: Four times a day (QID) | RESPIRATORY_TRACT | 99 refills | Status: DC | PRN

## 2022-05-02 NOTE — Assessment & Plan Note (Addendum)
Asthma flare off of her maintenance inhaler.  Patient education regarding her maintenance/controller inhaler. Recommend that she restart Symbicort 2 puffs twice daily.  If symptoms persist was given a 5-day course of prednisone 20 mg. Asthma action plan discussed. Chest x-ray earlier this year was clear.  She is a never smoker.  If symptoms persist will need to check a chest x-ray  Plan Patient Instructions  Restart Symbicort 2 puffs Twice daily, rinse after use .  Continue on Zyrtec 10 mg daily Continue [name] monthly Albuterol inhaler or nebulizer as needed Asthma action plan Prednisone 20 mg daily for 5 days-keep this on hold if symptoms do not improve after restarting Symbicort. Follow up with Dr. Vaughan Browner in 3 months and As needed   Please contact office for sooner follow up if symptoms do not improve or worsen or seek emergency care

## 2022-05-02 NOTE — Patient Instructions (Addendum)
Restart Symbicort 2 puffs Twice daily, rinse after use .  Continue on Zyrtec 10 mg daily Continue [name] monthly Albuterol inhaler or nebulizer as needed Asthma action plan Prednisone 20 mg daily for 5 days-keep this on hold if symptoms do not improve after restarting Symbicort. Follow up with Dr. Isaiah Serge in 3 months and As needed   Please contact office for sooner follow up if symptoms do not improve or worsen or seek emergency care

## 2022-05-02 NOTE — Progress Notes (Signed)
@Patient  ID: Tara Campbell, female    DOB: 1946-06-13, 76 y.o.   MRN: [id number]  Chief Complaint  Patient presents with   Acute Visit    Referring provider: Hali Marry, *  HPI: 76 year old female never smoker followed for severe persistent asthma with an allergic phenotype, allergic rhinitis.  Medical history significant for nasal polyposis and chronic sinusitis Atrial fibrillation on chronic anticoagulation therapy, coronary disease and diabetes  TEST/EVENTS :  Spirometry December 2019 FEV1 75%, ratio 74, FVC 76%   CT chest Apr 22, 2019 showed no evidence of mediastinal adenopathy, hepatic steatosis and coronary artery disease, no acute process in the lungs   CT sinuses December 09, 2018 showed extensive chronic pansinusitis.   2D echo August 22, 2020 EF [id number], grade 1 diastolic dysfunction   September 24, 2020.   Lab work showed normal CBC except for elevated eosinophils at absolute count of 800   Chest x-ray August 16, 2020 clear  05/02/2022 Acute OV : Asthma  Patient presents for an acute office visit.  She complains over the last 6 weeks she has had increased asthmatic symptoms with cough with thick white mucus,  intermittent wheezing and shortness of breath.  She denies any fever, chest pain, orthopnea, edema.  ACT score is 11. Had being doing very well , feels [name] has really been helping good.  She says that wheezing was worse last week seems to be better this week.  But she is concerned because she is going out of the country next week. Unable to do exhaled nitric oxide testing today.  Chest x-ray December 26, 2021 showed clear lungs  She was changed from Trelegy to Symbicort November 2022.  Unfortunately patient misunderstood the instructions and is not taking any maintenance inhaler.  She is doing [name] home injections monthly.  On zyrtec daily . She has had increased albuterol inhaler use over the last several days. She is going out of the country to  [place] next week.  She is traveling with her family.   Allergies  Allergen Reactions   Latex Hives   Sulfa Antibiotics Rash    Immunization History  Administered Date(s) Administered   Fluad Quad(high Dose 65+) 09/22/2019, 09/06/2021   Influenza, High Dose Seasonal PF 08/29/2017, 09/07/2018, 08/13/2020   Moderna SARS-COV2 Booster Vaccination 10/03/2021   PFIZER(Purple Top)SARS-COV-2 Vaccination 12/13/2019, 01/04/2020, 09/03/2020, 03/24/2021, 04/18/2022   Pfizer Covid-19 Vaccine Bivalent Booster 3yrs & up 10/17/2021   Pneumococcal Conjugate-13 11/16/2019   Pneumococcal Polysaccharide-23 08/29/2017   Tdap 05/29/2017   Zoster Recombinat (Shingrix) 02/28/2018, 06/12/2018    Past Medical History:  Diagnosis Date   Allergy    Arthritis    Asthma    Colon polyps    Coronary artery disease    Diabetes mellitus without complication ([place])    Gout    Heart attack ([place]) 07/30/2016   PCI, 2 stents   Hyperlipidemia    Hypertension    Internal hemorrhoids    Left rotator cuff tear    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   Otomycosis of right ear 09/27/2017   Paroxysmal atrial fibrillation (HCC)    Thyroid disease     Tobacco History: Social History   Tobacco Use  Smoking Status Never  Smokeless Tobacco Never   Counseling given: Not Answered   Outpatient Medications Prior to Visit  Medication Sig Dispense Refill   acetaminophen (TYLENOL) 650 MG CR tablet Take 1 tablet (650 mg total) by mouth every 8 (eight) hours as needed for pain.  90 tablet 3   allopurinol (ZYLOPRIM) 300 MG tablet Take 1 tablet (300 mg total) by mouth 2 (two) times daily. 180 tablet 1   apixaban (ELIQUIS) 5 MG TABS tablet Take 1 tablet (5 mg total) by mouth 2 (two) times daily. 180 tablet 1   atenolol (TENORMIN) 100 MG tablet Take 1 tablet (100 mg total) by mouth daily. 90 tablet 1   atorvastatin (LIPITOR) 80 MG tablet Take 1 tablet (80 mg total) by mouth at bedtime. 90 tablet 3   Cyanocobalamin (B-12 PO) Take  1,000 mcg by mouth daily.     empagliflozin (JARDIANCE) 10 MG TABS tablet Take 1 tablet (10 mg total) by mouth daily before breakfast. 90 tablet 1   Evolocumab with Infusor (REPATHA PUSHTRONEX SYSTEM) 420 MG/3.5ML SOCT Inject 420 mg into the skin every 30 (thirty) days. 3.6 mL 11   ezetimibe (ZETIA) 10 MG tablet Take 1 tablet (10 mg total) by mouth daily. 90 tablet 3   fluticasone (FLONASE) 50 MCG/ACT nasal spray Place 1 spray into both nostrils daily. 16 g 3   glucose blood test strip To be used twice daily for testing blood sugars. E11.65 200 each 11   irbesartan-hydrochlorothiazide (AVALIDE) 300-12.5 MG tablet Take 0.5 tablets by mouth daily. 45 tablet 1   isosorbide mononitrate (IMDUR) 60 MG 24 hr tablet Take 1 tablet (60 mg total) by mouth daily. 90 tablet 1   levothyroxine (SYNTHROID) 125 MCG tablet Take 1 tablet (125 mcg total) by mouth daily before breakfast. 90 tablet 0   Mepolizumab ([name]) 100 MG/ML SOAJ Inject 1 mL (100 mg total) into the skin every 28 (twenty-eight) days. 1 mL 4   metFORMIN (GLUCOPHAGE) 500 MG tablet Take 2 tablets (1,000 mg total) by mouth 2 (two) times daily with a meal. 180 tablet 3   nitroGLYCERIN (NITROSTAT) 0.4 MG SL tablet Place 1 tablet (0.4 mg total) under the tongue every 5 (five) minutes as needed for chest pain (or tightness). 30 tablet prn   Omega-3 Fatty Acids (FISH OIL) 1000 MG CAPS Take 1 capsule (1,000 mg total) by mouth daily. 90 capsule 3   Semaglutide, 2 MG/DOSE, 8 MG/3ML SOPN Inject 2 mg as directed once a week. 9 mL 1   Vitamin D, Cholecalciferol, 25 MCG (1000 UT) TABS Take 25 mcg by mouth daily in the afternoon. 60 tablet    zinc gluconate 50 MG tablet Take 50 mg by mouth daily.     albuterol (VENTOLIN HFA) 108 (90 Base) MCG/ACT inhaler Inhale 2 puffs into the lungs every 6 (six) hours as needed for wheezing. 18 g prn   budesonide-formoterol (SYMBICORT) 80-4.5 MCG/ACT inhaler Inhale 2 puffs into the lungs in the morning and at bedtime. 10.2 g 5    EPINEPHrine 0.3 mg/0.3 mL IJ SOAJ injection Inject 0.3 mg into the muscle as needed for anaphylaxis. Call 9-1-1 after use. 1 each 2   ipratropium-albuterol (DUONEB) 0.5-2.5 (3) MG/3ML SOLN Take 3 mLs by nebulization every 6 (six) hours as needed. (Patient not taking: Reported on 05/02/2022) 3 mL PRN   No facility-administered medications prior to visit.     Review of Systems:   Constitutional:   No  weight loss, night sweats,  Fevers, chills, fatigue, or  lassitude.  HEENT:   No headaches,  Difficulty swallowing,  Tooth/dental problems, or  Sore throat,                No sneezing, itching, ear ache, nasal congestion, post nasal drip,   CV:  No chest pain,  Orthopnea, PND, swelling in lower extremities, anasarca, dizziness, palpitations, syncope.   GI  No heartburn, indigestion, abdominal pain, nausea, vomiting, diarrhea, change in bowel habits, loss of appetite, bloody stools.   Resp: .  No chest wall deformity  Skin: no rash or lesions.  GU: no dysuria, change in color of urine, no urgency or frequency.  No flank pain, no hematuria   MS:  No joint pain or swelling.  No decreased range of motion.  No back pain.    Physical Exam  BP 136/64 (BP Location: Left Arm, Cuff Size: Large)   Pulse 85   Temp 97.8 F (36.6 C) (Temporal)   Ht 5' 2.5" (1.588 m)   Wt 182 lb 6.4 oz (82.7 kg)   SpO2 97%   BMI 32.83 kg/m   GEN: A/Ox3; pleasant , NAD, well nourished    HEENT:  /AT,  EACs-clear, TMs-wnl, NOSE-clear, THROAT-clear, no lesions, no postnasal drip or exudate noted.   NECK:  Supple w/ fair ROM; no JVD; normal carotid impulses w/o bruits; no thyromegaly or nodules palpated; no lymphadenopathy.    RESP  Clear  P & A; w/o, wheezes/ rales/ or rhonchi. no accessory muscle use, no dullness to percussion  CARD:  RRR, no m/r/g, no peripheral edema, pulses intact, no cyanosis or clubbing.  GI:   Soft & nt; nml bowel sounds; no organomegaly or masses detected.   Musco: Warm bil,  no deformities or joint swelling noted.   Neuro: alert, no focal deficits noted.    Skin: Warm, no lesions or rashes    Lab Results:  CBC    Component Value Date/Time   WBC 10.0 12/26/2021 1318   RBC 4.00 12/26/2021 1318   HGB 12.2 12/26/2021 1318   HGB 12.8 12/26/2017 1630   HCT 35.8 (L) 12/26/2021 1318   HCT 37.3 12/26/2017 1630   PLT 315 12/26/2021 1318   MCV 89.5 12/26/2021 1318   MCV 86 12/26/2017 1630   MCH 30.5 12/26/2021 1318   MCHC 34.1 12/26/2021 1318   RDW 14.5 12/26/2021 1318   RDW 13.5 12/26/2017 1630   LYMPHSABS 1.7 02/21/2021 0922   LYMPHSABS 1.8 12/26/2017 1630   MONOABS 0.8 02/21/2021 0922   EOSABS 1.0 (H) 02/21/2021 0922   EOSABS 1.0 (H) 12/26/2017 1630   BASOSABS 0.1 02/21/2021 0922   BASOSABS 0.1 12/26/2017 1630    BMET    Component Value Date/Time   NA 137 12/26/2021 1318   K 3.9 12/26/2021 1318   CL 104 12/26/2021 1318   CO2 24 12/26/2021 1318   GLUCOSE 113 (H) 12/26/2021 1318   BUN 14 12/26/2021 1318   CREATININE 0.90 12/26/2021 1318   CREATININE 0.85 10/09/2021 0000   CALCIUM 10.1 12/26/2021 1318   GFRNONAA >60 12/26/2021 1318   GFRNONAA 46 (L) 04/24/2021 0000   GFRAA 53 (L) 04/24/2021 0000    BNP No results found for: BNP  ProBNP No results found for: PROBNP  Imaging: [place] LIMITED JOINT SPACE STRUCTURES UP RIGHT  Result Date: 04/26/2022 Procedure: Real-time Ultrasound Guided injection of the right first [facility] Device: Samsung HS60 Verbal informed consent obtained. Time-out conducted. Noted no overlying erythema, induration, or other signs of local infection. Skin prepped in a sterile fashion. Local anesthesia: Topical Ethyl chloride. With sterile technique and under real time ultrasound guidance: Noted arthritic joint, 1/2 cc kenalog 40, 1/2 cc lidocaine injected easily. Completed without difficulty Advised to call if fevers/chills, erythema, induration, drainage, or persistent bleeding. Images permanently  stored and available for  review in PACS. Impression: Technically successful ultrasound guided injection.        Latest Ref Rng & Units 11/17/2020    9:14 AM 01/30/2018   11:41 AM  PFT Results  FVC-Pre L 1.75   1.74    FVC-Predicted Pre % 63   60    FVC-Post L 1.74   1.80    FVC-Predicted Post % 62   63    Pre FEV1/FVC % % 67   67    Post FEV1/FCV % % 67   69    FEV1-Pre L 1.17   1.17    FEV1-Predicted Pre % 56   54    FEV1-Post L 1.17   1.24    DLCO uncorrected ml/min/mmHg  20.06    DLCO UNC% %  82    DLCO corrected ml/min/mmHg  20.45    DLCO COR %Predicted %  84    DLVA Predicted %  118    TLC L  4.04    TLC % Predicted %  80    RV % Predicted %  87      Lab Results  Component Value Date   NITRICOXIDE 49 11/14/2018        Assessment & Plan:   Severe persistent allergic asthma Asthma flare off of her maintenance inhaler.  Patient education regarding her maintenance/controller inhaler. Recommend that she restart Symbicort 2 puffs twice daily.  If symptoms persist was given a 5-day course of prednisone 20 mg. Asthma action plan discussed. Chest x-ray earlier this year was clear.  She is a never smoker.  If symptoms persist will need to check a chest x-ray  Plan Patient Instructions  Restart Symbicort 2 puffs Twice daily, rinse after use .  Continue on Zyrtec 10 mg daily Continue [name] monthly Albuterol inhaler or nebulizer as needed Asthma action plan Prednisone 20 mg daily for 5 days-keep this on hold if symptoms do not improve after restarting Symbicort. Follow up with Dr. Vaughan Browner in 3 months and As needed   Please contact office for sooner follow up if symptoms do not improve or worsen or seek emergency care       Non-seasonal allergic rhinitis Continue with trigger prevention.  Continue current regimen.  Plan  Patient Instructions  Restart Symbicort 2 puffs Twice daily, rinse after use .  Continue on Zyrtec 10 mg daily Continue [name] monthly Albuterol inhaler or nebulizer  as needed Asthma action plan Prednisone 20 mg daily for 5 days-keep this on hold if symptoms do not improve after restarting Symbicort. Follow up with Dr. Vaughan Browner in 3 months and As needed   Please contact office for sooner follow up if symptoms do not improve or worsen or seek emergency care         Rexene Edison, NP 05/02/2022

## 2022-05-02 NOTE — Assessment & Plan Note (Signed)
Continue with trigger prevention.  Continue current regimen.  Plan  Patient Instructions  Restart Symbicort 2 puffs Twice daily, rinse after use .  Continue on Zyrtec 10 mg daily Continue [name] monthly Albuterol inhaler or nebulizer as needed Asthma action plan Prednisone 20 mg daily for 5 days-keep this on hold if symptoms do not improve after restarting Symbicort. Follow up with Dr. Isaiah Serge in 3 months and As needed   Please contact office for sooner follow up if symptoms do not improve or worsen or seek emergency care

## 2022-05-04 ENCOUNTER — Other Ambulatory Visit: Payer: Self-pay | Admitting: Family Medicine

## 2022-05-04 ENCOUNTER — Ambulatory Visit (INDEPENDENT_AMBULATORY_CARE_PROVIDER_SITE_OTHER): Payer: PPO | Admitting: Physician Assistant

## 2022-05-04 ENCOUNTER — Encounter: Payer: Self-pay | Admitting: Physician Assistant

## 2022-05-04 VITALS — BP 151/68 | HR 84 | Ht 62.5 in | Wt 182.0 lb

## 2022-05-04 DIAGNOSIS — I2510 Atherosclerotic heart disease of native coronary artery without angina pectoris: Secondary | ICD-10-CM

## 2022-05-04 DIAGNOSIS — Z7184 Encounter for health counseling related to travel: Secondary | ICD-10-CM | POA: Diagnosis not present

## 2022-05-04 MED ORDER — NITROGLYCERIN 0.4 MG SL SUBL: 0.4000 mg | SUBLINGUAL_TABLET | SUBLINGUAL | 99 refills | Status: DC | PRN

## 2022-05-04 NOTE — Progress Notes (Signed)
Established Patient Office Visit  Subjective   Patient ID: Tara Campbell, female    DOB: 10-22-1946  Age: 76 y.o. MRN: [id number]  No chief complaint on file.   HPI Pt is leaving for [place] on June 10th. She wanted to make sure she was ready to leave. She wanted her immunizations reviewed. She was concerned about not having the MMR/IPV series.    .. Active Ambulatory Problems    Diagnosis Date Noted   Severe persistent allergic asthma 01/16/2017   Paroxysmal atrial fibrillation ([place]) 01/16/2017   Cardiomegaly 01/16/2017   History of myocardial infarct at age greater than 15 years 01/23/2017   Mixed diabetic hyperlipidemia associated with type 2 diabetes mellitus ([place]) 01/23/2017   Acquired hypothyroidism 01/23/2017   Gout of ankle 04/08/2017   Hypertension goal BP (blood pressure) < 140/90 08/29/2017   Coronary artery disease involving native coronary artery of native heart with angina pectoris ([place]) 08/29/2017   Sensorineural hearing loss (SNHL) of both ears 09/27/2017   Anticoagulant long-term use - on eliquis for PAF 10/21/2017   Encounter for long-term (current) use of medications 11/28/2017   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   Controlled diabetes mellitus type 2 with complications ([place]) 24/40/1027   Class 1 obesity due to excess calories with serious comorbidity in adult 12/27/2017   Type 2 diabetes mellitus with diabetic chronic kidney disease ([place]) 08/06/2018   Non-seasonal allergic rhinitis 08/26/2018   Dupuytren's contracture of left hand 08/28/2018   Trigger finger, left index finger 08/28/2018   Hilar adenopathy 10/13/2018   Mediastinal adenopathy 10/13/2018   History of non-ST elevation myocardial infarction (NSTEMI) 04/25/2021   Hyperlipidemia 04/27/2021   Angina pectoris ([place]) 06/23/2021   BMI 33.0-33.9,adult 09/06/2021   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 10/02/2021   TIA (transient ischemic attack) 12/26/2021   Hypervitaminosis  01/03/2022   Memory impairment 01/03/2022   Resolved Ambulatory Problems    Diagnosis Date Noted   Controlled type 2 diabetes mellitus without complication, without long-term current use of insulin ([place]) 01/23/2017   Severe persistent asthma with exacerbation 01/06/2017   Encounter for monitoring statin therapy 05/29/2017   Blurred vision 05/29/2017   Hearing difficulty of both ears 05/29/2017   Trochanteric bursitis, right hip 05/29/2017   Need for shingles vaccine 05/31/2017   Otomycosis of right ear 09/27/2017   Idiopathic chronic gout 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 11/28/2017   Encounter for weight loss counseling 03/06/2018   Urticaria 08/24/2019   Acute bronchitis 08/16/2020   Asthma 09/28/2020   Sinusitis 11/17/2020   Otitis media, left 11/06/2021   Past Medical History:  Diagnosis Date   Allergy    Arthritis    Colon polyps    Coronary artery disease    Diabetes mellitus without complication ([place])    Gout    Heart attack ([place]) 07/30/2016   Hypertension    Internal hemorrhoids    Left rotator cuff tear    Thyroid disease      Review of Systems  All other systems reviewed and are negative.    Objective:         Assessment & Plan:  Marland KitchenMarland KitchenNolan was seen today for follow-up.  Diagnoses and all orders for this visit:  Travel advice encounter  Coronary artery disease involving native coronary artery of native heart without angina pectoris -     nitroGLYCERIN (NITROSTAT) 0.4 MG SL tablet; Place 1 tablet (0.4 mg total) under the tongue every 5 (five) minutes as needed for chest  pain (or tightness).   Reviewed immunizations Pt is UTD on shingles/Tdap/Flu/Covid/Pneumonia Discussed I do not think MMR/IPV is needed before traveling to [place].  Discussed all medications and anything she might need before travel.  Refilled nitroglycerin to use as needed.  Follow up as needed.      Iran Planas, PA-C

## 2022-05-09 ENCOUNTER — Other Ambulatory Visit: Payer: Self-pay | Admitting: Neurology

## 2022-05-09 DIAGNOSIS — I2510 Atherosclerotic heart disease of native coronary artery without angina pectoris: Secondary | ICD-10-CM

## 2022-05-09 MED ORDER — NITROGLYCERIN 0.4 MG SL SUBL: 0.4000 mg | SUBLINGUAL_TABLET | SUBLINGUAL | 99 refills | Status: DC | PRN

## 2022-05-09 NOTE — Telephone Encounter (Signed)
[facility] pharmacy called and LVM  stating Nitro can only be written in quantities of 25 pills. Pended new RX. They also need to know if patient to call 911 if takes 2 or 3 pills. Please advise.

## 2022-05-10 ENCOUNTER — Ambulatory Visit (HOSPITAL_COMMUNITY)
Admission: RE | Admit: 2022-05-10 | Discharge: 2022-05-10 | Disposition: A | Discharge status: Home / Self Care | Payer: PPO | Source: Ambulatory Visit | Attending: Cardiology | Admitting: Cardiology

## 2022-05-10 ENCOUNTER — Encounter (HOSPITAL_COMMUNITY): Payer: Self-pay | Admitting: Cardiology

## 2022-05-10 ENCOUNTER — Telehealth (HOSPITAL_COMMUNITY): Payer: Self-pay

## 2022-05-10 VITALS — BP 128/70 | HR 64 | Wt 185.6 lb

## 2022-05-10 DIAGNOSIS — E1169 Type 2 diabetes mellitus with other specified complication: Secondary | ICD-10-CM

## 2022-05-10 DIAGNOSIS — Z79899 Other long term (current) drug therapy: Secondary | ICD-10-CM | POA: Diagnosis not present

## 2022-05-10 DIAGNOSIS — I2510 Atherosclerotic heart disease of native coronary artery without angina pectoris: Secondary | ICD-10-CM | POA: Diagnosis not present

## 2022-05-10 DIAGNOSIS — R040 Epistaxis: Secondary | ICD-10-CM | POA: Insufficient documentation

## 2022-05-10 DIAGNOSIS — J45909 Unspecified asthma, uncomplicated: Secondary | ICD-10-CM | POA: Diagnosis not present

## 2022-05-10 DIAGNOSIS — E782 Mixed hyperlipidemia: Secondary | ICD-10-CM | POA: Diagnosis not present

## 2022-05-10 DIAGNOSIS — E119 Type 2 diabetes mellitus without complications: Secondary | ICD-10-CM | POA: Insufficient documentation

## 2022-05-10 DIAGNOSIS — I25119 Atherosclerotic heart disease of native coronary artery with unspecified angina pectoris: Secondary | ICD-10-CM | POA: Insufficient documentation

## 2022-05-10 DIAGNOSIS — I208 Other forms of angina pectoris: Secondary | ICD-10-CM

## 2022-05-10 DIAGNOSIS — I252 Old myocardial infarction: Secondary | ICD-10-CM | POA: Diagnosis not present

## 2022-05-10 DIAGNOSIS — Z955 Presence of coronary angioplasty implant and graft: Secondary | ICD-10-CM | POA: Diagnosis not present

## 2022-05-10 DIAGNOSIS — Z7901 Long term (current) use of anticoagulants: Secondary | ICD-10-CM | POA: Diagnosis not present

## 2022-05-10 DIAGNOSIS — I480 Paroxysmal atrial fibrillation: Secondary | ICD-10-CM | POA: Diagnosis not present

## 2022-05-10 DIAGNOSIS — I10 Essential (primary) hypertension: Secondary | ICD-10-CM | POA: Insufficient documentation

## 2022-05-10 DIAGNOSIS — I5022 Chronic systolic (congestive) heart failure: Secondary | ICD-10-CM | POA: Insufficient documentation

## 2022-05-10 LAB — CBC
HCT: 36.7 % (ref 36.0–46.0)
Hemoglobin: 12 g/dL (ref 12.0–15.0)
MCH: 29.6 pg (ref 26.0–34.0)
MCHC: 32.7 g/dL (ref 30.0–36.0)
MCV: 90.4 fL (ref 80.0–100.0)
Platelets: 284 10*3/uL (ref 150–400)
RBC: 4.06 MIL/uL (ref 3.87–5.11)
RDW: 14.2 % (ref 11.5–15.5)
WBC: 7 10*3/uL (ref 4.0–10.5)
nRBC: 0 % (ref 0.0–0.2)

## 2022-05-10 LAB — LIPID PANEL
Cholesterol: 270 mg/dL — ABNORMAL HIGH (ref 0–200)
HDL: 65 mg/dL (ref >40)
LDL Cholesterol: 182 mg/dL — ABNORMAL HIGH (ref 0–99)
Total CHOL/HDL Ratio: 4.2 RATIO
Triglycerides: 114 mg/dL (ref <150)
VLDL: 23 mg/dL (ref 0–40)

## 2022-05-10 LAB — BASIC METABOLIC PANEL
Anion gap: 5 (ref 5–15)
BUN: 15 mg/dL (ref 8–23)
CO2: 27 mmol/L (ref 22–32)
Calcium: 9.8 mg/dL (ref 8.9–10.3)
Chloride: 108 mmol/L (ref 98–111)
Creatinine, Ser: 0.92 mg/dL (ref 0.44–1.00)
GFR, Estimated: >60 mL/min (ref >60)
Glucose, Bld: 126 mg/dL — ABNORMAL HIGH (ref 70–99)
Potassium: 4.5 mmol/L (ref 3.5–5.1)
Sodium: 140 mmol/L (ref 135–145)

## 2022-05-10 MED ORDER — ISOSORBIDE MONONITRATE ER 60 MG PO TB24: 90.0000 mg | ORAL_TABLET | Freq: Every day | ORAL | 2 refills | Status: DC

## 2022-05-10 NOTE — Progress Notes (Signed)
Primary Care: Dr Linford Arnold  Primary Cardiologist: Dr Jens Som  HF Cardiology: Dr. Shirlee Latch  HPI: Ms Westerkamp is a 76 y.o. with a history of CAD, PAF, Asthma, HTN, DMII. Had previous PCI to RCA.   Admitted 04/2021 with NSTEMI. Had cath that showed severe complex stenosis of her LAD in the proximal mid and distal portion of the vessel.  Patent left circumflex with no significant stenosis, patent left main with no significant stenosis, patent RCA with RCA stents mild nonobstructive disease less than 50% stenosis and normal LVEDP.  Her LAD stenosis was unfavorable for PCI due to heavy calcification and marked tortuosity. There is also noted to be a small caliber vessel with likely poor targets for CABG.  Medical therapy was recommended.  Plavix stopped in 9/22 due to nose bleeds.   She was admitted in 1/23 with ?TIA => neurology evaluated, imaging showed no CVA.  Echo in 1/23 showed EF 60-65%, normal RV, IVC normal.   Patient returns for followup of CAD.  She reports episodes of chest tightness/burning when she walks up inclines or walks a long distance on flat ground.  Pain resolves with rest.  This has been going on for several months now and is stable.  No rest pain or pain with minimal exertion.  She has finished cardiac rehab.  Weight up 4 lbs.  No BRPBR/melena.  She has been off Repatha for several months, working on getting patient assistance so she can restart it.   Labs (1/23): K 3.9, creatinine 0.9, LDL 66  ECG (personally reviewed): NSR, 1st degree AVB, poor RWP  PMH: 1. CAD: Remote PCI to RCA.  NSTEMI in 5/22, LHC showed severe complex stenosis proximal/mid/distal LAD with patent RCA stents.  LAD was unfavorable for PCI with marked tortuosity and small caliber, also poor target for CABG due to small size.  Medical management.  - Echo (1/23): EF 60-65%, normal RV, normal IVC.  2. Atrial fibrillation: Paroxysmal.  3. ?TIA in 1/23: Imaging negative.  4. HTN 5. Type 2 diabetes 6.  Asthma 7. ABIs normal in 12/22 8. Gout 9. Hyperlipidemia  Review of Systems: All systems reviewed and negative except as per HPI.   Current Outpatient Medications  Medication Sig Dispense Refill   acetaminophen (TYLENOL) 650 MG CR tablet Take 1 tablet (650 mg total) by mouth every 8 (eight) hours as needed for pain. 90 tablet 3   albuterol (VENTOLIN HFA) 108 (90 Base) MCG/ACT inhaler Inhale 2 puffs into the lungs every 6 (six) hours as needed for wheezing. 18 g prn   allopurinol (ZYLOPRIM) 300 MG tablet Take 1 tablet (300 mg total) by mouth 2 (two) times daily. 180 tablet 1   apixaban (ELIQUIS) 5 MG TABS tablet Take 1 tablet (5 mg total) by mouth 2 (two) times daily. 180 tablet 1   atenolol (TENORMIN) 100 MG tablet Take 1 tablet (100 mg total) by mouth daily. 90 tablet 1   atorvastatin (LIPITOR) 80 MG tablet Take 1 tablet (80 mg total) by mouth at bedtime. 90 tablet 3   budesonide-formoterol (SYMBICORT) 80-4.5 MCG/ACT inhaler Inhale 2 puffs into the lungs in the morning and at bedtime. 10.2 g 5   Cyanocobalamin (B-12 PO) Take 1,000 mcg by mouth daily.     empagliflozin (JARDIANCE) 10 MG TABS tablet Take 1 tablet (10 mg total) by mouth daily before breakfast. 90 tablet 1   EPINEPHrine 0.3 mg/0.3 mL IJ SOAJ injection Inject 0.3 mg into the muscle as needed for anaphylaxis. Call  9-1-1 after use. 1 each 2   ezetimibe (ZETIA) 10 MG tablet Take 1 tablet (10 mg total) by mouth daily. 90 tablet 3   fluticasone (FLONASE) 50 MCG/ACT nasal spray Place 1 spray into both nostrils daily. 16 g 3   glucose blood test strip To be used twice daily for testing blood sugars. E11.65 200 each 11   ipratropium-albuterol (DUONEB) 0.5-2.5 (3) MG/3ML SOLN Take 3 mLs by nebulization every 6 (six) hours as needed. 3 mL PRN   irbesartan-hydrochlorothiazide (AVALIDE) 300-12.5 MG tablet Take 0.5 tablets by mouth daily. 45 tablet 1   levothyroxine (SYNTHROID) 125 MCG tablet Take 1 tablet (125 mcg total) by mouth daily  before breakfast. 90 tablet 0   Mepolizumab ([name]) 100 MG/ML SOAJ Inject 1 mL (100 mg total) into the skin every 28 (twenty-eight) days. 1 mL 4   metFORMIN (GLUCOPHAGE) 500 MG tablet Take 2 tablets (1,000 mg total) by mouth 2 (two) times daily with a meal. 180 tablet 3   nitroGLYCERIN (NITROSTAT) 0.4 MG SL tablet Place 1 tablet (0.4 mg total) under the tongue every 5 (five) minutes as needed for chest pain (or tightness). 25 tablet prn   Omega-3 Fatty Acids (FISH OIL) 1000 MG CAPS Take 1 capsule (1,000 mg total) by mouth daily. 90 capsule 3   Semaglutide, 2 MG/DOSE, 8 MG/3ML SOPN Inject 2 mg as directed once a week. 9 mL 1   Vitamin D, Cholecalciferol, 25 MCG (1000 UT) TABS Take 25 mcg by mouth daily in the afternoon. 60 tablet    zinc gluconate 50 MG tablet Take 50 mg by mouth daily.     Evolocumab with Infusor (REPATHA PUSHTRONEX SYSTEM) 420 MG/3.5ML SOCT Inject 420 mg into the skin every 30 (thirty) days. (Patient not taking: Reported on 05/10/2022) 3.6 mL 11   isosorbide mononitrate (IMDUR) 60 MG 24 hr tablet Take 1.5 tablets (90 mg total) by mouth daily. 180 tablet 2   No current facility-administered medications for this encounter.    Allergies  Allergen Reactions   Latex Hives   Sulfa Antibiotics Rash      Social History   Socioeconomic History   Marital status: Divorced    Spouse name: Not on file   Number of children: 2   Years of education: 13.5   Highest education level: 12th grade  Occupational History    Comment: retired  Tobacco Use   Smoking status: Never   Smokeless tobacco: Never  Vaping Use   Vaping Use: Never used  Substance and Sexual Activity   Alcohol use: Yes    Comment: Rare   Drug use: No   Sexual activity: Not Currently  Other Topics Concern   Not on file  Social History Narrative   Lives alone. Likes to crafts and paint in her free time.      Right Handed    Lives in a one story home    Social Determinants of Health   Financial Resource  Strain: Not on file  Food Insecurity: Not on file  Transportation Needs: Not on file  Physical Activity: Inactive (04/06/2021)   Exercise Vital Sign    Days of Exercise per Week: 0 days    Minutes of Exercise per Session: 0 min  Stress: Not on file  Social Connections: Moderately Isolated (04/06/2021)   Social Connection and Isolation Panel [NHANES]    Frequency of Communication with Friends and Family: More than three times a week    Frequency of Social Gatherings with Friends and  Family: More than three times a week    Attends Religious Services: Never    Active Member of Clubs or Organizations: Yes    Attends Music therapist: More than 4 times per year    Marital Status: Divorced  Human resources officer Violence: Not on file      Family History  Problem Relation Age of Onset   Hypertension Mother    Hyperlipidemia Mother    Alzheimer's disease Mother    Vascular Disease Mother    Glaucoma Father    Heart disease Father    Hypertension Father    Diabetes Father    Hyperlipidemia Father    Skin cancer Father    Diabetes Paternal Grandmother    Colon cancer Neg Hx    Esophageal cancer Neg Hx     Vitals:   05/10/22 1040  BP: 128/70  Pulse: 64  SpO2: 95%  Weight: 84.2 kg (185 lb 9.6 oz)   Wt Readings from Last 3 Encounters:  05/10/22 84.2 kg (185 lb 9.6 oz)  05/04/22 82.6 kg (182 lb)  05/02/22 82.7 kg (182 lb 6.4 oz)    PHYSICAL EXAM: General: NAD Neck: No JVD, no thyromegaly or thyroid nodule.  Lungs: Clear to auscultation bilaterally with normal respiratory effort. CV: Nondisplaced PMI.  Heart regular S1/S2, no S3/S4, no murmur.  No peripheral edema.  No carotid bruit.  Normal pedal pulses.  Abdomen: Soft, nontender, no hepatosplenomegaly, no distention.  Skin: Intact without lesions or rashes.  Neurologic: Alert and oriented x 3.  Psych: Normal affect. Extremities: No clubbing or cyanosis.  HEENT: Normal.   ASSESSMENT & PLAN: 1. CAD: Remote RCA PCI.   NSTEMI in 5/22, LHC showed severe complex stenosis proximal/mid/distal LAD with patent RCA stents.  LAD was unfavorable for PCI with marked tortuosity and small caliber, also poor target for CABG due to small size.  Medical management.  Plavix stopped 9/22 due to nose bleed.  She has had chronic stable angina with moderate exertion for 3-4 months now, this has not been worsening and does not occur at rest or with minimal exertion.   - I will arrange for cardiac PET to risk stratify.  If there is a large area of anterior ischemia, would re-address LAD intervention.  - I will have her continue ASA 81 for now with angina and extensive LAD disease despite also being on apixaban.  - Continue atorvastatin 80 mg daily + Zetia.  She is off Repatha currently and trying to get back on it.  Check lipids today.  Will ask lipid clinic to help her with patient assistance for Repatha.  - Continue atenolol 100 mg daily.  - Increase Imdur to 90 mg daily.  2. Atrial fibrillation: Paroxysmal.  She is in NSR.  - Continue Eliquis.   3. HTN: BP controlled.  4. Diabetes: Continue Jardiance and semaglutide.   She will followup in 6 wks with APP, she will call if chest pain worsens.   Loralie Champagne 05/10/2022

## 2022-05-10 NOTE — Telephone Encounter (Addendum)
Pt aware, agreeable, and verbalized understanding  Referral sent   ----- Message from Laurey Morale, MD sent at 05/10/2022  4:43 PM EDT ----- Lipids much worse off Repatha.  Please get lipid clinic to address getting her back on Repatha and make sure she is taking her statin and Zetia.

## 2022-05-10 NOTE — Patient Instructions (Signed)
Medication Changes:  Continue Asprin   Increase Imdur to 90 mg daily (1 1/2 TAB)  Lab Work:  Labs done today, your results will be available in MyChart, we will contact you for abnormal readings.   Testing/Procedures:  Your provider has recommended you have a Cardiac PET Scan at [facility]. We will get this approved with your insurance company and get it scheduled for you. We will call you with the date and time and instructions.   Referrals:  none  Special Instructions // Education:  none  Follow-Up in: 6 weeks   At the [facility], you and your health needs are our priority. We have a designated team specialized in the treatment of Heart Failure. This Care Team includes your primary Heart Failure Specialized Cardiologist (physician), Advanced Practice Providers (APPs- Physician Assistants and Nurse Practitioners), and Pharmacist who all work together to provide you with the care you need, when you need it.   You may see any of the following providers on your designated Care Team at your next follow up:  Dr Arvilla Meres Dr Carron Curie, NP Robbie Lis, [place] [facility] [place], [place] Karle Plumber, PharmD   Please be sure to bring in all your medications bottles to every appointment.   Need to Contact us:  If you have any questions or concerns before your next appointment please send [place] a message through [place] or call our office at [phone].    TO LEAVE A MESSAGE FOR THE NURSE SELECT OPTION 2, PLEASE LEAVE A MESSAGE INCLUDING: YOUR NAME DATE OF BIRTH CALL BACK NUMBER REASON FOR CALL**this is important as we prioritize the call backs  YOU WILL RECEIVE A CALL BACK THE SAME DAY AS LONG AS YOU CALL BEFORE 4:00 PM

## 2022-05-14 ENCOUNTER — Other Ambulatory Visit (HOSPITAL_COMMUNITY): Payer: Self-pay

## 2022-05-14 DIAGNOSIS — I208 Other forms of angina pectoris: Secondary | ICD-10-CM

## 2022-05-23 ENCOUNTER — Encounter: Payer: Self-pay | Admitting: Pulmonary Disease

## 2022-05-23 ENCOUNTER — Ambulatory Visit: Payer: PPO | Admitting: Pulmonary Disease

## 2022-05-23 VITALS — BP 128/70 | HR 84 | Temp 97.9°F | Ht 62.5 in | Wt 184.0 lb

## 2022-05-23 DIAGNOSIS — J4551 Severe persistent asthma with (acute) exacerbation: Secondary | ICD-10-CM | POA: Diagnosis not present

## 2022-05-23 NOTE — Progress Notes (Signed)
Tara Campbell    [id number]    Jan 30, 1946  Primary Care Physician:Metheney, Barbarann Ehlers, MD  Referring Physician: Agapito Games, MD [address] [place],  [place] [address]  Chief complaint:   Follow up for asthma [name] in March 2022  HPI: 76 year old with severe persistent asthma, nasal polyposis, allergies She is on maximal therapy with LABA/LAMA/ICS, Singulair and is getting allergy shots with the allergist at [facility] to have significant symptoms with recurrent exacerbations.  She is needed to go on prednisone 4 times the past year. Has chronic dyspnea on exertion, wheeze  Pets: No pets Occupation: Used to work in Production manager Exposures: No mold, hot tub, Financial controller.  No feather pillows or comforters Smoking history: Never smoker Travel history: Originally from [place].  Moved to [place] in 2017 Relevant family history: No significant family history of lung disease  Interim history: Continues on nucala with good response.  She stopped taking her controller medication, Symbicort earlier this year due to confusion and instructions and had a mild exacerbation.  She was seen by nurse practitioner last month and given a short prednisone taper and restarted on Symbicort.  States that her breathing is back to normal now with no issues next Recently had a trip to [place] with her family and had a good time there   Outpatient Encounter Medications as of 05/23/2022  Medication Sig   acetaminophen (TYLENOL) 650 MG CR tablet Take 1 tablet (650 mg total) by mouth every 8 (eight) hours as needed for pain.   albuterol (VENTOLIN HFA) 108 (90 Base) MCG/ACT inhaler Inhale 2 puffs into the lungs every 6 (six) hours as needed for wheezing.   allopurinol (ZYLOPRIM) 300 MG tablet Take 1 tablet (300 mg total) by mouth 2 (two) times daily.   apixaban (ELIQUIS) 5 MG TABS tablet Take 1 tablet (5 mg total) by mouth 2 (two) times daily.    atenolol (TENORMIN) 100 MG tablet Take 1 tablet (100 mg total) by mouth daily.   atorvastatin (LIPITOR) 80 MG tablet Take 1 tablet (80 mg total) by mouth at bedtime.   budesonide-formoterol (SYMBICORT) 80-4.5 MCG/ACT inhaler Inhale 2 puffs into the lungs in the morning and at bedtime.   Cyanocobalamin (B-12 PO) Take 1,000 mcg by mouth daily.   empagliflozin (JARDIANCE) 10 MG TABS tablet Take 1 tablet (10 mg total) by mouth daily before breakfast.   EPINEPHrine 0.3 mg/0.3 mL IJ SOAJ injection Inject 0.3 mg into the muscle as needed for anaphylaxis. Call 9-1-1 after use.   Evolocumab with Infusor (REPATHA PUSHTRONEX SYSTEM) 420 MG/3.5ML SOCT Inject 420 mg into the skin every 30 (thirty) days.   ezetimibe (ZETIA) 10 MG tablet Take 1 tablet (10 mg total) by mouth daily.   fluticasone (FLONASE) 50 MCG/ACT nasal spray Place 1 spray into both nostrils daily.   glucose blood test strip To be used twice daily for testing blood sugars. E11.65   ipratropium-albuterol (DUONEB) 0.5-2.5 (3) MG/3ML SOLN Take 3 mLs by nebulization every 6 (six) hours as needed.   irbesartan-hydrochlorothiazide (AVALIDE) 300-12.5 MG tablet Take 0.5 tablets by mouth daily.   isosorbide mononitrate (IMDUR) 60 MG 24 hr tablet Take 1.5 tablets (90 mg total) by mouth daily.   levothyroxine (SYNTHROID) 125 MCG tablet Take 1 tablet (125 mcg total) by mouth daily before breakfast.   Mepolizumab ([name]) 100 MG/ML SOAJ Inject 1 mL (100 mg total) into the skin every 28 (twenty-eight) days.  metFORMIN (GLUCOPHAGE) 500 MG tablet Take 2 tablets (1,000 mg total) by mouth 2 (two) times daily with a meal.   nitroGLYCERIN (NITROSTAT) 0.4 MG SL tablet Place 1 tablet (0.4 mg total) under the tongue every 5 (five) minutes as needed for chest pain (or tightness).   Omega-3 Fatty Acids (FISH OIL) 1000 MG CAPS Take 1 capsule (1,000 mg total) by mouth daily.   Semaglutide, 2 MG/DOSE, 8 MG/3ML SOPN Inject 2 mg as directed once a week.   Vitamin D,  Cholecalciferol, 25 MCG (1000 UT) TABS Take 25 mcg by mouth daily in the afternoon.   zinc gluconate 50 MG tablet Take 50 mg by mouth daily.   No facility-administered encounter medications on file as of 05/23/2022.    Physical Exam: Blood pressure 128/70, pulse 84, temperature 97.9 F (36.6 C), temperature source Oral, height 5' 2.5" (1.588 m), weight 184 lb (83.5 kg), SpO2 98 %. Gen:      No acute distress HEENT:  EOMI, sclera anicteric Neck:     No masses; no thyromegaly Lungs:    Clear to auscultation bilaterally; normal respiratory effort CV:         Regular rate and rhythm; no murmurs Abd:      + bowel sounds; soft, non-tender; no palpable masses, no distension Ext:    No edema; adequate peripheral perfusion Skin:      Warm and dry; no rash Neuro: alert and oriented x 3 Psych: normal mood and affect   Data Reviewed: Imaging: Chest x-ray 08/16/2020-no acute cardiopulmonary disease.  Chest x-ray 04/25/2021-no active cardiopulmonary disease Chest x-ray 12/26/2021-no acute cardiopulmonary disease I have reviewed the images personally  PFTs: 11/17/2020 FVC 1.74 [62%], FEV1 1.17 (56%], F/F 67 No significant obstruction, restriction suggested by reduced lung volumes.  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  She has good response to biologic therapy Resume Symbicort.  Continue 80/4.5 twice daily  Plan/Recommendations: Continue Symbicort, nucala Follow-up in 6 months  Chilton Greathouse MD [facility] 05/23/2022, 9:37 AM  CC: Agapito Games, *

## 2022-05-23 NOTE — Patient Instructions (Signed)
I am glad you are doing well with regard to your breathing Continue inhalers as prescribed and continue nucala Follow-up in 6 months

## 2022-05-31 ENCOUNTER — Ambulatory Visit (INDEPENDENT_AMBULATORY_CARE_PROVIDER_SITE_OTHER): Payer: PPO | Admitting: Family Medicine

## 2022-05-31 ENCOUNTER — Encounter: Payer: Self-pay | Admitting: Family Medicine

## 2022-05-31 VITALS — BP 132/61 | HR 77 | Resp 16 | Ht 62.5 in | Wt 184.0 lb

## 2022-05-31 DIAGNOSIS — E118 Type 2 diabetes mellitus with unspecified complications: Secondary | ICD-10-CM

## 2022-05-31 DIAGNOSIS — M7061 Trochanteric bursitis, right hip: Secondary | ICD-10-CM

## 2022-05-31 DIAGNOSIS — S99922A Unspecified injury of left foot, initial encounter: Secondary | ICD-10-CM | POA: Diagnosis not present

## 2022-05-31 LAB — POCT GLYCOSYLATED HEMOGLOBIN (HGB A1C): Hemoglobin A1C: 6.4 % — AB (ref 4.0–5.6)

## 2022-05-31 NOTE — Assessment & Plan Note (Signed)
Pain is directly over the right outer greater trochanter most consistent with bursitis.  Given handout for home rehab exercises.  If not improving over the next 3 to 4 weeks then please let me know.  Ice as needed.  Might benefit from injection or formal physical therapy if not improving.  Interestingly she had a similar issue back in June 2018 and saw Dr. Benjamin Stain.

## 2022-05-31 NOTE — Progress Notes (Signed)
Established Patient Office Visit  Subjective   Patient ID: BEZA STEPPE, female    DOB: 1946/11/25  Age: 76 y.o. MRN: [id number]  Chief Complaint  Patient presents with   Diabetes    Follow up. Patient will call to schedule diabetes eye exam    Discuss Medication    Patient would like to discuss Isosorbide 90 mg dose and dizziness   Hip Pain    Right hip pain for 2 weeks after traveling to [place]    Colonoscopy    Patient will call to reschedule appointment.    Toe Pain    Patient fell 2 weeks ago on a boat and injured 4th toe on left foot.     HPI  Diabetes - no hypoglycemic events. No wounds or sores that are not healing well. No increased thirst or urination. Checking glucose at home. Taking medications as prescribed without any side effects.  Toe pain on the left foot s/p fall while transitioning off of the boat while she was traveling in [place].  She says it was pretty tender but interestingly it did not really bruise until about 3 days later.  Currently she is able to walk on it without any significant pain or discomfort but still has a little bruising and swelling left over.  He also reports that her right outer hip has been painful and bothersome as well.  She wonders if it could be arthritis.  It really got aggravated while she was doing a lot of walking on her trip.  And it sometimes keeps her from exercising.    ROS    Objective:     BP 132/61   Pulse 77   Resp 16   Ht 5' 2.5" (1.588 m)   Wt 184 lb (83.5 kg)   SpO2 93%   BMI 33.12 kg/m    Physical Exam Vitals and nursing note reviewed.  Constitutional:      Appearance: She is well-developed.  HENT:     Head: Normocephalic and atraumatic.  Cardiovascular:     Rate and Rhythm: Normal rate and regular rhythm.     Heart sounds: Normal heart sounds.  Pulmonary:     Effort: Pulmonary effort is normal.     Breath sounds: Normal breath sounds.  Musculoskeletal:     Comments: Left hip with normal range  of motion.  Abductor strength is 4 out of 5.  Tender directly over the greater trochanter.  Skin:    General: Skin is warm and dry.  Neurological:     Mental Status: She is alert and oriented to person, place, and time.  Psychiatric:        Behavior: Behavior normal.      Results for orders placed or performed in visit on 05/31/22  POCT glycosylated hemoglobin (Hb A1C)  Result Value Ref Range   Hemoglobin A1C 6.4 (A) 4.0 - 5.6 %   HbA1c POC (<> result, manual entry)     HbA1c, POC (prediabetic range)     HbA1c, POC (controlled diabetic range)        The ASCVD Risk score (Arnett DK, et al., 2019) failed to calculate for the following reasons:   The patient has a prior MI or stroke diagnosis    Assessment & Plan:   Problem List Items Addressed This Visit       Endocrine   Controlled diabetes mellitus type 2 with complications (HCC) - Primary    A1c looks fantastic today at 6.4.  She  is doing a really great job.  She was really proud of herself for actually losing weight on her trip and her cruise.  And her A1c is down.  Continue current regimen.  Follow-up in 3 to 4 months.      Relevant Orders   POCT glycosylated hemoglobin (Hb A1C) (Completed)     Musculoskeletal and Integument   Trochanteric bursitis of right hip    Pain is directly over the right outer greater trochanter most consistent with bursitis.  Given handout for home rehab exercises.  If not improving over the next 3 to 4 weeks then please let me know.  Ice as needed.  Might benefit from injection or formal physical therapy if not improving.  Interestingly she had a similar issue back in June 2018 and saw Dr. Benjamin Stain.      Other Visit Diagnoses     Injury of toe on left foot, initial encounter           Left toe injury-she is really not having a lot of discomfort or pain with walking.  She is able to flex the toes still some bruising present.  If not continuing to improve over the next several weeks  please let me know we can always get an x-ray for further work-up but I suspect it was just a soft tissue injury.  Return in about 3 months (around 08/31/2022) for Diabetes Follow up .    Nani Gasser, MD

## 2022-05-31 NOTE — Assessment & Plan Note (Signed)
A1c looks fantastic today at 6.4.  She is doing a really great job.  She was really proud of herself for actually losing weight on her trip and her cruise.  And her A1c is down.  Continue current regimen.  Follow-up in 3 to 4 months.

## 2022-06-07 ENCOUNTER — Ambulatory Visit (INDEPENDENT_AMBULATORY_CARE_PROVIDER_SITE_OTHER): Payer: PPO | Admitting: Pharmacist

## 2022-06-07 ENCOUNTER — Encounter: Payer: Self-pay | Admitting: Pharmacist

## 2022-06-07 ENCOUNTER — Telehealth: Payer: Self-pay | Admitting: Pharmacist

## 2022-06-07 DIAGNOSIS — E1169 Type 2 diabetes mellitus with other specified complication: Secondary | ICD-10-CM

## 2022-06-07 DIAGNOSIS — I25119 Atherosclerotic heart disease of native coronary artery with unspecified angina pectoris: Secondary | ICD-10-CM

## 2022-06-07 DIAGNOSIS — E782 Mixed hyperlipidemia: Secondary | ICD-10-CM

## 2022-06-07 NOTE — Telephone Encounter (Signed)
PA For Repatha submitted for patient assistance.  Key:  [id number]. PA approved

## 2022-06-07 NOTE — Progress Notes (Signed)
Patient ID: Tara Campbell                 DOB: 27-Oct-1946                    MRN: [id number]     HPI: ALIZABETH ANTONIO is a 76 y.o. female patient referred to lipid clinic by Dr Shirlee Latch. PMH is significant for CHF, CAD, TIA, and T2DM.    Patient currently managed on atorvastatin and ezetimibe.  LDL was previously better controlled while on Repatha however it became cost prohibitive.  Patient sent over to [facility] for help with completion of patient assistance paperwork.  Current Medications:  Atorvastatin 80mg  daily Ezetimibe 10mg  daily  LDL goal: <55  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   Otomycosis of right ear 09/27/2017   Paroxysmal atrial fibrillation (HCC)    Thyroid disease     Current Outpatient Medications on File Prior to Visit  Medication Sig Dispense Refill   acetaminophen (TYLENOL) 650 MG CR tablet Take 1 tablet (650 mg total) by mouth every 8 (eight) hours as needed for pain. 90 tablet 3   albuterol (VENTOLIN HFA) 108 (90 Base) MCG/ACT inhaler Inhale 2 puffs into the lungs every 6 (six) hours as needed for wheezing. 18 g prn   allopurinol (ZYLOPRIM) 300 MG tablet Take 1 tablet (300 mg total) by mouth 2 (two) times daily. 180 tablet 1   apixaban (ELIQUIS) 5 MG TABS tablet Take 1 tablet (5 mg total) by mouth 2 (two) times daily. 180 tablet 1   atenolol (TENORMIN) 100 MG tablet Take 1 tablet (100 mg total) by mouth daily. 90 tablet 1   atorvastatin (LIPITOR) 80 MG tablet Take 1 tablet (80 mg total) by mouth at bedtime. 90 tablet 3   budesonide-formoterol (SYMBICORT) 80-4.5 MCG/ACT inhaler Inhale 2 puffs into the lungs in the morning and at bedtime. 10.2 g 5    Cyanocobalamin (B-12 PO) Take 1,000 mcg by mouth daily.     empagliflozin (JARDIANCE) 10 MG TABS tablet Take 1 tablet (10 mg total) by mouth daily before breakfast. 90 tablet 1   EPINEPHrine 0.3 mg/0.3 mL IJ SOAJ injection Inject 0.3 mg into the muscle as needed for anaphylaxis. Call 9-1-1 after use. 1 each 2   Evolocumab with Infusor (REPATHA PUSHTRONEX SYSTEM) 420 MG/3.5ML SOCT Inject 420 mg into the skin every 30 (thirty) days. 3.6 mL 11   ezetimibe (ZETIA) 10 MG tablet Take 1 tablet (10 mg total) by mouth daily. 90 tablet 3   fluticasone (FLONASE) 50 MCG/ACT nasal spray Place 1 spray into both nostrils daily. 16 g 3   glucose blood test strip To be used twice daily for testing blood sugars. E11.65 200 each 11   ipratropium-albuterol (DUONEB) 0.5-2.5 (3) MG/3ML SOLN Take 3 mLs by nebulization every 6 (six) hours as needed. 3 mL PRN   irbesartan-hydrochlorothiazide (AVALIDE) 300-12.5 MG tablet Take 0.5 tablets by mouth daily. 45 tablet 1   isosorbide mononitrate (IMDUR) 60 MG 24 hr tablet Take 1.5 tablets (90 mg total) by mouth daily. 180  tablet 2   levothyroxine (SYNTHROID) 125 MCG tablet Take 1 tablet (125 mcg total) by mouth daily before breakfast. 90 tablet 0   Mepolizumab ([name]) 100 MG/ML SOAJ Inject 1 mL (100 mg total) into the skin every 28 (twenty-eight) days. 1 mL 4   metFORMIN (GLUCOPHAGE) 500 MG tablet Take 2 tablets (1,000 mg total) by mouth 2 (two) times daily with a meal. 180 tablet 3   nitroGLYCERIN (NITROSTAT) 0.4 MG SL tablet Place 1 tablet (0.4 mg total) under the tongue every 5 (five) minutes as needed for chest pain (or tightness). 25 tablet prn   Omega-3 Fatty Acids (FISH OIL) 1000 MG CAPS Take 1 capsule (1,000 mg total) by mouth daily. 90 capsule 3   Semaglutide, 2 MG/DOSE, 8 MG/3ML SOPN Inject 2 mg as directed once a week. 9 mL 1   Vitamin D, Cholecalciferol, 25 MCG (1000 UT) TABS Take 25 mcg by mouth daily in the afternoon. 60 tablet    zinc gluconate 50 MG tablet Take 50  mg by mouth daily.     No current facility-administered medications on file prior to visit.    Allergies  Allergen Reactions   Latex Hives   Sulfa Antibiotics Rash    Assessment/Plan:  1. Hyperlipidemia - Patient LDL 182 which is above goal of <55.  Aggressive goal chosen due to patient's T2DM, CHF, and CAD.  Completed patient assistance paperwork with patient and will send Rx over to HF clinic for Dr. Shirlee Latch to sign. Then will fax application packet to [employer].  Laural Golden, PharmD, BCACP, CDCES, CPP [address], [address] [place], [place], [address] Phone: [phone], Fax: [phone]

## 2022-06-08 NOTE — Telephone Encounter (Signed)
Repatha patient assistance application faxed to [employer]

## 2022-06-11 DIAGNOSIS — I25118 Atherosclerotic heart disease of native coronary artery with other forms of angina pectoris: Secondary | ICD-10-CM | POA: Diagnosis not present

## 2022-06-11 DIAGNOSIS — Z7951 Long term (current) use of inhaled steroids: Secondary | ICD-10-CM | POA: Diagnosis not present

## 2022-06-11 DIAGNOSIS — J449 Chronic obstructive pulmonary disease, unspecified: Secondary | ICD-10-CM | POA: Diagnosis not present

## 2022-06-11 DIAGNOSIS — E1159 Type 2 diabetes mellitus with other circulatory complications: Secondary | ICD-10-CM | POA: Diagnosis not present

## 2022-06-11 DIAGNOSIS — I10 Essential (primary) hypertension: Secondary | ICD-10-CM | POA: Diagnosis not present

## 2022-06-18 ENCOUNTER — Encounter (HOSPITAL_COMMUNITY): Payer: Self-pay

## 2022-06-18 ENCOUNTER — Ambulatory Visit (HOSPITAL_COMMUNITY)
Admission: RE | Admit: 2022-06-18 | Discharge: 2022-06-18 | Disposition: A | Discharge status: Home / Self Care | Payer: PPO | Source: Ambulatory Visit | Attending: Family Medicine | Admitting: Family Medicine

## 2022-06-18 VITALS — BP 120/80 | HR 92 | Wt 186.0 lb

## 2022-06-18 DIAGNOSIS — I2510 Atherosclerotic heart disease of native coronary artery without angina pectoris: Secondary | ICD-10-CM | POA: Diagnosis not present

## 2022-06-18 DIAGNOSIS — I25119 Atherosclerotic heart disease of native coronary artery with unspecified angina pectoris: Secondary | ICD-10-CM | POA: Diagnosis not present

## 2022-06-18 DIAGNOSIS — Z7901 Long term (current) use of anticoagulants: Secondary | ICD-10-CM | POA: Diagnosis not present

## 2022-06-18 DIAGNOSIS — E782 Mixed hyperlipidemia: Secondary | ICD-10-CM | POA: Diagnosis not present

## 2022-06-18 DIAGNOSIS — Z7984 Long term (current) use of oral hypoglycemic drugs: Secondary | ICD-10-CM | POA: Diagnosis not present

## 2022-06-18 DIAGNOSIS — I480 Paroxysmal atrial fibrillation: Secondary | ICD-10-CM | POA: Insufficient documentation

## 2022-06-18 DIAGNOSIS — I252 Old myocardial infarction: Secondary | ICD-10-CM | POA: Insufficient documentation

## 2022-06-18 DIAGNOSIS — Z79899 Other long term (current) drug therapy: Secondary | ICD-10-CM | POA: Diagnosis not present

## 2022-06-18 DIAGNOSIS — E119 Type 2 diabetes mellitus without complications: Secondary | ICD-10-CM | POA: Insufficient documentation

## 2022-06-18 DIAGNOSIS — E1169 Type 2 diabetes mellitus with other specified complication: Secondary | ICD-10-CM

## 2022-06-18 DIAGNOSIS — I10 Essential (primary) hypertension: Secondary | ICD-10-CM | POA: Insufficient documentation

## 2022-06-18 NOTE — Progress Notes (Signed)
Primary Care: Dr Madilyn Fireman  Primary Cardiologist: Dr Stanford Breed  HF Cardiology: Dr. Aundra Dubin  HPI: Ms Rabadi is a 76 y.o. with a history of CAD, PAF, Asthma, HTN, DMII. Had previous PCI to RCA.   Admitted 04/2021 with NSTEMI. Had cath that showed severe complex stenosis of her LAD in the proximal mid and distal portion of the vessel.  Patent left circumflex with no significant stenosis, patent left main with no significant stenosis, patent RCA with RCA stents mild nonobstructive disease less than 50% stenosis and normal LVEDP.  Her LAD stenosis was unfavorable for PCI due to heavy calcification and marked tortuosity. There is also noted to be a small caliber vessel with likely poor targets for CABG.  Medical therapy was recommended.  Plavix stopped in 9/22 due to nose bleeds.   She was admitted in 1/23 with ?TIA => neurology evaluated, imaging showed no CVA.  Echo in 1/23 showed EF 60-65%, normal RV, IVC normal.   Follow up 6/23, continued with episodes of chest tightness with walking up inclines. Imdur increased and arranged for cardiac PET.   Today she returns for CAD follow up with her sister , who is a Marine scientist. Overall feeling fine. She just returned from cruise on the [facility]. No longer having chest pain. Had some dizziness during her vacation and changed Imdur dosing to 60 mg q am and 30 mg q pm and dizziness resolved. No dyspnea, abnormal bleeding, palpitations, edema, or PND/Orthopnea. Appetite ok. No fever or chills. Taking all medications. Back on Repatha now.   Labs (1/23): K 3.9, creatinine 0.9, LDL 66 Labs (6/23): K 4.5, creatinine 0.92, LDL 182 (off statin)  ECG (personally reviewed): none ordered today.  PMH: 1. CAD: Remote PCI to RCA.  NSTEMI in 5/22, LHC showed severe complex stenosis proximal/mid/distal LAD with patent RCA stents.  LAD was unfavorable for PCI with marked tortuosity and small caliber, also poor target for CABG due to small size.  Medical management.  -  Echo (1/23): EF 60-65%, normal RV, normal IVC.  2. Atrial fibrillation: Paroxysmal.  3. ?TIA in 1/23: Imaging negative.  4. HTN 5. Type 2 diabetes 6. Asthma 7. ABIs normal in 12/22 8. Gout 9. Hyperlipidemia  Review of Systems: All systems reviewed and negative except as per HPI.  Current Outpatient Medications  Medication Sig Dispense Refill   acetaminophen (TYLENOL) 650 MG CR tablet Take 1 tablet (650 mg total) by mouth every 8 (eight) hours as needed for pain. 90 tablet 3   albuterol (VENTOLIN HFA) 108 (90 Base) MCG/ACT inhaler Inhale 2 puffs into the lungs every 6 (six) hours as needed for wheezing. 18 g prn   allopurinol (ZYLOPRIM) 300 MG tablet Take 1 tablet (300 mg total) by mouth 2 (two) times daily. 180 tablet 1   apixaban (ELIQUIS) 5 MG TABS tablet Take 1 tablet (5 mg total) by mouth 2 (two) times daily. 180 tablet 1   atenolol (TENORMIN) 100 MG tablet Take 1 tablet (100 mg total) by mouth daily. 90 tablet 1   atorvastatin (LIPITOR) 80 MG tablet Take 1 tablet (80 mg total) by mouth at bedtime. 90 tablet 3   budesonide-formoterol (SYMBICORT) 80-4.5 MCG/ACT inhaler Inhale 2 puffs into the lungs in the morning and at bedtime. 10.2 g 5   Cyanocobalamin (B-12 PO) Take 1,000 mcg by mouth daily.     empagliflozin (JARDIANCE) 10 MG TABS tablet Take 1 tablet (10 mg total) by mouth daily before breakfast. 90 tablet 1  EPINEPHrine 0.3 mg/0.3 mL IJ SOAJ injection Inject 0.3 mg into the muscle as needed for anaphylaxis. Call 9-1-1 after use. 1 each 2   Evolocumab with Infusor (REPATHA PUSHTRONEX SYSTEM) 420 MG/3.5ML SOCT Inject 420 mg into the skin every 30 (thirty) days. 3.6 mL 11   ezetimibe (ZETIA) 10 MG tablet Take 1 tablet (10 mg total) by mouth daily. 90 tablet 3   fluticasone (FLONASE) 50 MCG/ACT nasal spray Place 1 spray into both nostrils daily. 16 g 3   glucose blood test strip To be used twice daily for testing blood sugars. E11.65 200 each 11   ipratropium-albuterol (DUONEB)  0.5-2.5 (3) MG/3ML SOLN Take 3 mLs by nebulization every 6 (six) hours as needed. 3 mL PRN   irbesartan-hydrochlorothiazide (AVALIDE) 300-12.5 MG tablet Take 0.5 tablets by mouth daily. 45 tablet 1   isosorbide mononitrate (IMDUR) 60 MG 24 hr tablet Take 1.5 tablets (90 mg total) by mouth daily. 180 tablet 2   levothyroxine (SYNTHROID) 125 MCG tablet Take 1 tablet (125 mcg total) by mouth daily before breakfast. 90 tablet 0   Mepolizumab ([name]) 100 MG/ML SOAJ Inject 1 mL (100 mg total) into the skin every 28 (twenty-eight) days. 1 mL 4   metFORMIN (GLUCOPHAGE) 500 MG tablet Take 2 tablets (1,000 mg total) by mouth 2 (two) times daily with a meal. 180 tablet 3   nitroGLYCERIN (NITROSTAT) 0.4 MG SL tablet Place 1 tablet (0.4 mg total) under the tongue every 5 (five) minutes as needed for chest pain (or tightness). 25 tablet prn   Omega-3 Fatty Acids (FISH OIL) 1000 MG CAPS Take 1 capsule (1,000 mg total) by mouth daily. 90 capsule 3   Semaglutide, 2 MG/DOSE, 8 MG/3ML SOPN Inject 2 mg as directed once a week. 9 mL 1   Vitamin D, Cholecalciferol, 25 MCG (1000 UT) TABS Take 25 mcg by mouth daily in the afternoon. 60 tablet    zinc gluconate 50 MG tablet Take 50 mg by mouth daily.     No current facility-administered medications for this encounter.   Allergies  Allergen Reactions   Latex Hives   Sulfa Antibiotics Rash   Social History   Socioeconomic History   Marital status: Divorced    Spouse name: Not on file   Number of children: 2   Years of education: 13.5   Highest education level: 12th grade  Occupational History    Comment: retired  Tobacco Use   Smoking status: Never   Smokeless tobacco: Never  Vaping Use   Vaping Use: Never used  Substance and Sexual Activity   Alcohol use: Yes    Comment: Rare   Drug use: No   Sexual activity: Not Currently  Other Topics Concern   Not on file  Social History Narrative   Lives alone. Likes to crafts and paint in her free time.       Right Handed    Lives in a one story home    Social Determinants of Health   Financial Resource Strain: Low Risk  (04/06/2021)   Overall Financial Resource Strain (CARDIA)    Difficulty of Paying Living Expenses: Not hard at all  Food Insecurity: No Food Insecurity (04/06/2021)   Hunger Vital Sign    Worried About Running Out of Food in the Last Year: Never true    Ran Out of Food in the Last Year: Never true  Transportation Needs: No Transportation Needs (04/06/2021)   PRAPARE - Administrator, Civil Service (Medical):  No    Lack of Transportation (Non-Medical): No  Physical Activity: Inactive (04/06/2021)   Exercise Vital Sign    Days of Exercise per Week: 0 days    Minutes of Exercise per Session: 0 min  Stress: No Stress Concern Present (04/06/2021)   [employer] of Occupational Health - Occupational Stress Questionnaire    Feeling of Stress : Not at all  Social Connections: Moderately Isolated (04/06/2021)   Social Connection and Isolation Panel [NHANES]    Frequency of Communication with Friends and Family: More than three times a week    Frequency of Social Gatherings with Friends and Family: More than three times a week    Attends Religious Services: Never    Database administrator or Organizations: Yes    Attends Engineer, structural: More than 4 times per year    Marital Status: Divorced  Intimate Partner Violence: Not At Risk (04/06/2021)   Humiliation, Afraid, Rape, and Kick questionnaire    Fear of Current or Ex-Partner: No    Emotionally Abused: No    Physically Abused: No    Sexually Abused: No   Family History  Problem Relation Age of Onset   Hypertension Mother    Hyperlipidemia Mother    Alzheimer's disease Mother    Vascular Disease Mother    Glaucoma Father    Heart disease Father    Hypertension Father    Diabetes Father    Hyperlipidemia Father    Skin cancer Father    Diabetes Paternal Grandmother    Colon cancer Neg Hx     Esophageal cancer Neg Hx    BP 120/80   Pulse 92   Wt 84.4 kg (186 lb)   SpO2 98%   BMI 33.48 kg/m   Wt Readings from Last 3 Encounters:  06/18/22 84.4 kg (186 lb)  05/31/22 83.5 kg (184 lb)  05/23/22 83.5 kg (184 lb)   PHYSICAL EXAM: General:  NAD. No resp difficulty HEENT: Normal Neck: Supple. No JVD. Carotids 2+ bilat; no bruits. No lymphadenopathy or thryomegaly appreciated. Cor: PMI nondisplaced. Regular rate & rhythm. No rubs, gallops or murmurs. Lungs: Clear Abdomen: Obese, soft, nontender, nondistended. No hepatosplenomegaly. No bruits or masses. Good bowel sounds. Extremities: No cyanosis, clubbing, rash, edema Neuro: Alert & oriented x 3, cranial nerves grossly intact. Moves all 4 extremities w/o difficulty. Affect pleasant.  ASSESSMENT & PLAN: 1. CAD: Remote RCA PCI.  NSTEMI in 5/22, LHC showed severe complex stenosis proximal/mid/distal LAD with patent RCA stents.  LAD was unfavorable for PCI with marked tortuosity and small caliber, also poor target for CABG due to small size.  Medical management.  Plavix stopped 9/22 due to nose bleed.  She has a history of chronic stable angina. This seems to have resolved with increase in her Imdur.  - Cardiac PET was previously arranged to risk stratify.  If there is a large area of anterior ischemia, would re-address LAD intervention. This has not been scheduled yet, will arrange. - Continue ASA 81 for now with previous angina and extensive LAD disease, despite also being on apixaban.  - Continue atorvastatin 80 mg daily + Zetia.   - She is now back on Repatha - Continue atenolol 100 mg daily. If dizziness returns, discussed taking qhs. - Continue Imdur 90 mg daily. (OK to take 60 mg qam/30 mg qpm) 2. Atrial fibrillation: Paroxysmal.  Regular on exam today. - Continue Eliquis.  No bleeding issues. Recent CBC stable. 3. HTN: BP controlled.  4. Diabetes: Continue Jardiance and semaglutide.   Follow up in 3-4 months with Dr.  Aundra Dubin.  Maricela Bo [facility] FNP-BC 06/18/2022

## 2022-06-18 NOTE — Progress Notes (Signed)
Patient has been ordered for Cardiac PET scan. Pt is awaiting in network scheduling.

## 2022-06-18 NOTE — Patient Instructions (Addendum)
Thank you for coming in today  No labs today   Your physician recommends that you schedule a follow-up appointment in:  3-4 months with Dr Shirlee Latch please call in August 2023 to make appointment      Do the following things EVERYDAY: Weigh yourself in the morning before breakfast. Write it down and keep it in a log. Take your medicines as prescribed Eat low salt foods--Limit salt (sodium) to 2000 mg per day.  Stay as active as you can everyday Limit all fluids for the day to less than 2 liters   At the [facility], you and your health needs are our priority. As part of our continuing mission to provide you with exceptional heart care, we have created designated Provider Care Teams. These Care Teams include your primary Cardiologist (physician) and Advanced Practice Providers (APPs- Physician Assistants and Nurse Practitioners) who all work together to provide you with the care you need, when you need it.   You may see any of the following providers on your designated Care Team at your next follow up: Dr Arvilla Meres Dr Carron Curie, NP Robbie Lis, [facility], [facility], [place] Karle Plumber, PharmD   Please be sure to bring in all your medications bottles to every appointment.   If you have any questions or concerns before your next appointment please send [place] a message through [place] or call our office at [phone].    TO LEAVE A MESSAGE FOR THE NURSE SELECT OPTION 2, PLEASE LEAVE A MESSAGE INCLUDING: YOUR NAME DATE OF BIRTH CALL BACK NUMBER REASON FOR CALL**this is important as we prioritize the call backs  YOU WILL RECEIVE A CALL BACK THE SAME DAY AS LONG AS YOU CALL BEFORE 4:00 PM

## 2022-06-18 NOTE — Addendum Note (Signed)
Encounter addended by: Demetrius Charity, RN on: 06/18/2022 3:41 PM  Actions taken: Clinical Note Signed

## 2022-07-09 DIAGNOSIS — Z7951 Long term (current) use of inhaled steroids: Secondary | ICD-10-CM | POA: Diagnosis not present

## 2022-07-09 DIAGNOSIS — Z7984 Long term (current) use of oral hypoglycemic drugs: Secondary | ICD-10-CM | POA: Diagnosis not present

## 2022-07-09 DIAGNOSIS — I10 Essential (primary) hypertension: Secondary | ICD-10-CM | POA: Diagnosis not present

## 2022-07-09 DIAGNOSIS — Z7985 Long-term (current) use of injectable non-insulin antidiabetic drugs: Secondary | ICD-10-CM | POA: Diagnosis not present

## 2022-07-09 DIAGNOSIS — J449 Chronic obstructive pulmonary disease, unspecified: Secondary | ICD-10-CM | POA: Diagnosis not present

## 2022-07-09 DIAGNOSIS — E119 Type 2 diabetes mellitus without complications: Secondary | ICD-10-CM | POA: Diagnosis not present

## 2022-07-09 DIAGNOSIS — Z6832 Body mass index (BMI) 32.0-32.9, adult: Secondary | ICD-10-CM | POA: Diagnosis not present

## 2022-07-25 ENCOUNTER — Encounter: Payer: Self-pay | Admitting: General Practice

## 2022-08-01 ENCOUNTER — Ambulatory Visit: Payer: PPO | Admitting: Pulmonary Disease

## 2022-08-02 ENCOUNTER — Telehealth: Payer: Self-pay

## 2022-08-02 NOTE — Telephone Encounter (Addendum)
Patient left a vm msg stating she may have been possibly exposed to Covid from her daughter. Requesting if provider can send in Paxlovid rx to the pharmacy. Attempted to contact patient for an update on symptoms. No answer, left a detailed vm msg to return a call back to the clinic to schedule a virtual appointment. Patient advised if possible to do an at home Covid test. Direct call back info provided.

## 2022-08-03 ENCOUNTER — Encounter: Payer: Self-pay | Admitting: Family Medicine

## 2022-08-03 ENCOUNTER — Encounter: Payer: Self-pay | Admitting: Physician Assistant

## 2022-08-03 ENCOUNTER — Telehealth (INDEPENDENT_AMBULATORY_CARE_PROVIDER_SITE_OTHER): Payer: PPO | Admitting: Physician Assistant

## 2022-08-03 VITALS — HR 109

## 2022-08-03 DIAGNOSIS — U071 COVID-19: Secondary | ICD-10-CM | POA: Insufficient documentation

## 2022-08-03 DIAGNOSIS — J4550 Severe persistent asthma, uncomplicated: Secondary | ICD-10-CM | POA: Diagnosis not present

## 2022-08-03 DIAGNOSIS — Z20822 Contact with and (suspected) exposure to covid-19: Secondary | ICD-10-CM | POA: Insufficient documentation

## 2022-08-03 MED ORDER — NIRMATRELVIR/RITONAVIR (PAXLOVID)TABLET: 3.0000 | ORAL_TABLET | Freq: Two times a day (BID) | ORAL | 0 refills | Status: AC

## 2022-08-03 NOTE — Progress Notes (Addendum)
..Virtual Visit via telephone Note  I connected with Tara Campbell on 08/03/22 at 10:10 AM EDT by a telephone enabled telemedicine application and verified that I am speaking with the correct person using two identifiers.  Location: Patient: home Provider: clinic  .Marland KitchenParticipating in visit:  Patient: Tara Campbell Provider: Tandy Gaw PA-C   I discussed the limitations of evaluation and management by telemedicine and the availability of in person appointments. The patient expressed understanding and agreed to proceed.  History of Present Illness: Pt is a 76 yo female who calls into the clinic after being exposed to covid by her daughter and then having symptoms and positive covid test since yesterday 08/02/2022. Right now she has runny nose and scratchy throat. She has had covid once before and symptoms were mild. She is UTD on all her shots and boosters.   .. Active Ambulatory Problems    Diagnosis Date Noted   Severe persistent allergic asthma 01/16/2017   Paroxysmal atrial fibrillation (HCC) 01/16/2017   Cardiomegaly 01/16/2017   History of myocardial infarct at age greater than 60 years 01/23/2017   Mixed diabetic hyperlipidemia associated with type 2 diabetes mellitus (HCC) 01/23/2017   Acquired hypothyroidism 01/23/2017   Gout of ankle 04/08/2017   Trochanteric bursitis of right hip 05/29/2017   Hypertension goal BP (blood pressure) < 140/90 08/29/2017   Coronary artery disease involving native coronary artery of native heart with angina pectoris (HCC) 08/29/2017   Sensorineural hearing loss (SNHL) of both ears 09/27/2017   Anticoagulant long-term use - on eliquis for PAF 10/21/2017   Encounter for long-term (current) use of medications 11/28/2017   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   Controlled diabetes mellitus type 2 with complications (HCC) 12/27/2017   Class 1 obesity due to excess calories with serious comorbidity in adult 12/27/2017   Type 2 diabetes mellitus with diabetic  chronic kidney disease (HCC) 08/06/2018   Non-seasonal allergic rhinitis 08/26/2018   Dupuytren's contracture of left hand 08/28/2018   Trigger finger, left index finger 08/28/2018   Hilar adenopathy 10/13/2018   Mediastinal adenopathy 10/13/2018   History of non-ST elevation myocardial infarction (NSTEMI) 04/25/2021   Hyperlipidemia 04/27/2021   Angina pectoris (HCC) 06/23/2021   BMI 33.0-33.9,adult 09/06/2021   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 10/02/2021   TIA (transient ischemic attack) 12/26/2021   Hypervitaminosis 01/03/2022   Memory impairment 01/03/2022   COVID-19 virus infection 08/03/2022   Close exposure to COVID-19 virus 08/03/2022   Resolved Ambulatory Problems    Diagnosis Date Noted   Controlled type 2 diabetes mellitus without complication, without long-term current use of insulin (HCC) 01/23/2017   Severe persistent asthma with exacerbation 01/06/2017   Encounter for monitoring statin therapy 05/29/2017   Blurred vision 05/29/2017   Hearing difficulty of both ears 05/29/2017   Need for shingles vaccine 05/31/2017   Otomycosis of right ear 09/27/2017   Idiopathic chronic gout 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 11/28/2017   Encounter for weight loss counseling 03/06/2018   Urticaria 08/24/2019   Acute bronchitis 08/16/2020   Asthma 09/28/2020   Sinusitis 11/17/2020   Otitis media, left 11/06/2021   Past Medical History:  Diagnosis Date   Allergy    Arthritis    Colon polyps    Coronary artery disease    Diabetes mellitus without complication (HCC)    Gout    Heart attack (HCC) 07/30/2016   Hypertension    Internal hemorrhoids    Left rotator cuff tear    Thyroid disease  Observations/Objective: No acute distress Normal breathing No cough Some throat clearing  .Marland Kitchen Today's Vitals   08/03/22 1015  Pulse: (!) 109  SpO2: 94%   There is no height or weight on file to calculate BMI.    Assessment and  Plan: Marland KitchenMarland KitchenDiagnoses and all orders for this visit:  COVID-19 virus infection  Positive self-administered antigen test for COVID-19  Severe persistent asthma, unspecified whether complicated   Paxlovid sent to pharmacy for patient to pick up since on Day 2 of symptoms Pt is high risk due to her age and medical history Discussed red flags and when to seek UC/ED help Continue with symptomatic care Rest and hydrate Quarantine for 5 days from yesterday after 5 days may resume public exposure with mask until full 10 days from symptoms    Follow Up Instructions:    I discussed the assessment and treatment plan with the patient. The patient was provided an opportunity to ask questions and all were answered. The patient agreed with the plan and demonstrated an understanding of the instructions.   The patient was advised to call back or seek an in-person evaluation if the symptoms worsen or if the condition fails to improve as anticipated.  I provided 7 minutes of non-face-to-face time during this encounter.   Tandy Gaw, PA-C

## 2022-08-03 NOTE — Addendum Note (Signed)
Addended by: Nani Gasser D on: 08/03/2022 09:22 AM   Modules accepted: Orders

## 2022-08-03 NOTE — Telephone Encounter (Signed)
Called and LMOM regarding COVID 19.  We will trying to get in touch with her again to see how long she had had symptoms and what specific symptoms she was having.  Since we are unable to get in touch with her over the long holiday weekend I did verify that her GFR is greater than 60 I did go ahead and send in a prescription for Paxlovid.  She is also on Eliquis currently.

## 2022-08-08 ENCOUNTER — Encounter: Payer: PPO | Admitting: Psychology

## 2022-08-13 ENCOUNTER — Telehealth (HOSPITAL_COMMUNITY): Payer: Self-pay | Admitting: *Deleted

## 2022-08-13 ENCOUNTER — Encounter (HOSPITAL_COMMUNITY): Payer: Self-pay

## 2022-08-13 NOTE — Telephone Encounter (Signed)
Reaching out to patient to offer assistance regarding upcoming cardiac imaging study; pt verbalizes understanding of appt date/time, parking situation and where to check in, pre-test NPO status and verified current allergies; name and call back number provided for further questions should they arise  Larey Brick RN Navigator Cardiac Imaging [facility] [phone] office [phone] cell  Patient aware to avoid caffeine 12 hours prior to her cardiac PET and to hold Imdur starting today.

## 2022-08-14 ENCOUNTER — Encounter (HOSPITAL_COMMUNITY)
Admission: RE | Admit: 2022-08-14 | Discharge: 2022-08-14 | Disposition: A | Discharge status: Home / Self Care | Payer: PPO | Source: Ambulatory Visit | Attending: Cardiology | Admitting: Cardiology

## 2022-08-14 DIAGNOSIS — I208 Other forms of angina pectoris: Secondary | ICD-10-CM | POA: Diagnosis not present

## 2022-08-14 LAB — NM PET CT CARDIAC PERFUSION MULTI W/ABSOLUTE BLOODFLOW
MBFR: 1.33
Nuc Rest EF: 63 %
Nuc Stress EF: 59 %
Rest MBF: 1.24 ml/g/min
Stress MBF: 1.65 ml/g/min
TID: 1.29

## 2022-08-14 MED ORDER — REGADENOSON 0.4 MG/5ML IV SOLN
0.4000 mg | Freq: Once | INTRAVENOUS | Status: AC
Start: 2022-08-14 — End: 2022-08-14

## 2022-08-14 MED ORDER — RUBIDIUM RB82 GENERATOR (RUBYFILL)
22.0100 | PACK | Freq: Once | INTRAVENOUS | Status: AC
  Administered 2022-08-14: 22.01 via INTRAVENOUS

## 2022-08-14 MED ORDER — RUBIDIUM RB82 GENERATOR (RUBYFILL)
21.9300 | PACK | Freq: Once | INTRAVENOUS | Status: AC
  Administered 2022-08-14: 21.93 via INTRAVENOUS

## 2022-08-14 MED ORDER — REGADENOSON 0.4 MG/5ML IV SOLN
INTRAVENOUS | Status: AC
  Administered 2022-08-14: 0.4 mg via INTRAVENOUS
  Filled 2022-08-14: qty 5

## 2022-08-14 NOTE — Progress Notes (Signed)
Patient presents for a cardiac PET stress test and tolerated procedure without incident. Patient maintained acceptable vital signs throughout the test and was offered caffeine after test.  Patient ambulated out of department with a steady gait.  

## 2022-08-16 ENCOUNTER — Other Ambulatory Visit: Payer: Self-pay | Admitting: Cardiovascular Disease

## 2022-08-16 ENCOUNTER — Encounter: Payer: Self-pay | Admitting: Cardiovascular Disease

## 2022-08-16 ENCOUNTER — Encounter: Payer: Self-pay | Admitting: Sports Medicine

## 2022-08-16 ENCOUNTER — Encounter: Payer: Self-pay | Admitting: Family Medicine

## 2022-08-16 ENCOUNTER — Ambulatory Visit (INDEPENDENT_AMBULATORY_CARE_PROVIDER_SITE_OTHER): Payer: PPO | Admitting: Sports Medicine

## 2022-08-16 ENCOUNTER — Ambulatory Visit (INDEPENDENT_AMBULATORY_CARE_PROVIDER_SITE_OTHER): Payer: PPO

## 2022-08-16 DIAGNOSIS — M1711 Unilateral primary osteoarthritis, right knee: Secondary | ICD-10-CM

## 2022-08-16 DIAGNOSIS — M1712 Unilateral primary osteoarthritis, left knee: Secondary | ICD-10-CM | POA: Diagnosis not present

## 2022-08-16 DIAGNOSIS — M11262 Other chondrocalcinosis, left knee: Secondary | ICD-10-CM

## 2022-08-16 DIAGNOSIS — M11261 Other chondrocalcinosis, right knee: Secondary | ICD-10-CM | POA: Diagnosis not present

## 2022-08-16 DIAGNOSIS — Z09 Encounter for follow-up examination after completed treatment for conditions other than malignant neoplasm: Secondary | ICD-10-CM | POA: Diagnosis not present

## 2022-08-16 MED ORDER — CLOPIDOGREL BISULFATE 75 MG PO TABS: 75.0000 mg | ORAL_TABLET | Freq: Every day | ORAL | 3 refills | Status: DC

## 2022-08-16 NOTE — Assessment & Plan Note (Signed)
Very pleasant 76 year old female, increasing pain right knee with swelling, she does have a catheterization coming up next week so we will avoid NSAIDs for now, we did an aspiration and injection today, I would like some x-rays, home conditioning added, return to see me in 6 weeks.

## 2022-08-16 NOTE — Progress Notes (Signed)
Tonny Bollman, MD  You 2 hours ago (2:59 PM)   Please call in Rx for Plavix 75 mg daily and schedule for cath with me on Friday 9/29?    Tonny Bollman, MD 2 hours ago (2:59 PM)    Reviewed cardiac cath films with Dr. Shirlee Latch.  The patient has had progressive anginal symptoms.  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  However, with lack of other treatment options and a very small distal LAD unsuitable for PCI, I think it is reasonable to try to treat her proximal and mid LAD stenosis with PCI.  The patient understands there is some incremental risk increase compared to routine procedure, but I think the procedure would be predicted to be both successful and safe with a low risk of complications.  She will start on clopidogrel in addition to apixaban that she is taking chronically.  We will schedule her for cardiac catheterization and PCI on September 29 at her request.  She has a close friend that is her primary support and she will be back in town from [place] that week.  The patient prefers to wait until then unless her symptoms change.  She understands that if she has any progressive symptoms of chest pain or increased use of nitroglycerin that she should seek immediate medical attention.  I reviewed risks, indications, and alternatives to cardiac catheterization and possible PCI with the patient.  I have reviewed the risks, indications, and alternatives to cardiac catheterization, possible angioplasty, and stenting with the patient. Risks include but are not limited to bleeding, infection, vascular injury, stroke, myocardial infection, arrhythmia, kidney injury, radiation-related injury in the case of prolonged fluoroscopy use, emergency cardiac surgery, and death. The patient understands the risks of serious complication is 1-2 in 1000 with diagnostic cardiac cath and 1-2% or less with  angioplasty/stenting.     Tonny Bollman 08/16/2022 2:51 PM      Plavix sent to pharmacy on file and cath scheduled for Friday 08/31/22 at 0900 w/ Excell Seltzer. Called to speak with patient, but no answer. She will need an updated OV, EKG, and labs prior to procedure. Will review Plavix at that time as well. Will re-attempt to call patient.

## 2022-08-16 NOTE — Progress Notes (Unsigned)
Reviewed cardiac cath films with Dr. Shirlee Latch.  The patient has had progressive anginal symptoms.  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  However, with lack of other treatment options and a very small distal LAD unsuitable for PCI, I think it is reasonable to try to treat her proximal and mid LAD stenosis with PCI.  The patient understands there is some incremental risk increase compared to routine procedure, but I think the procedure would be predicted to be both successful and safe with a low risk of complications.  She will start on clopidogrel in addition to apixaban that she is taking chronically.  We will schedule her for cardiac catheterization and PCI on September 29 at her request.  She has a close friend that is her primary support and she will be back in town from [place] that week.  The patient prefers to wait until then unless her symptoms change.  She understands that if she has any progressive symptoms of chest pain or increased use of nitroglycerin that she should seek immediate medical attention.  I reviewed risks, indications, and alternatives to cardiac catheterization and possible PCI with the patient.  I have reviewed the risks, indications, and alternatives to cardiac catheterization, possible angioplasty, and stenting with the patient. Risks include but are not limited to bleeding, infection, vascular injury, stroke, myocardial infection, arrhythmia, kidney injury, radiation-related injury in the case of prolonged fluoroscopy use, emergency cardiac surgery, and death. The patient understands the risks of serious complication is 1-2 in 1000 with diagnostic cardiac cath and 1-2% or less with angioplasty/stenting.    Tonny Bollman 08/16/2022 2:51 PM

## 2022-08-16 NOTE — Progress Notes (Signed)
    Procedures performed today:    Procedure: Real-time Ultrasound Guided injection of right knee Device: Samsung [id number]  Verbal informed consent obtained.  Time-out conducted.  Noted no overlying erythema, induration, or other signs of local infection.  Skin prepped in a sterile fashion.  Local anesthesia: Topical Ethyl chloride.  With sterile technique and under real time ultrasound guidance: Noted effusion 1 cc Kenalog 40, 2 cc lidocaine, 2 cc bupivacaine injected easily Completed without difficulty  Advised to call if fevers/chills, erythema, induration, drainage, or persistent bleeding.  Images permanently stored and available for review in PACS.  Impression: Technically successful ultrasound guided injection.  Independent interpretation of notes and tests performed by another provider:   None.  Brief History, Exam, Impression, and Recommendations:    Primary osteoarthritis of right knee Very pleasant 76 year old female, increasing pain right knee with swelling, she does have a catheterization coming up next week so we will avoid NSAIDs for now, we did an aspiration and injection today, I would like some x-rays, home conditioning added, return to see me in 6 weeks.    ____________________________________________ Ihor Austin. Benjamin Stain, M.D., ABFM., CAQSM., AME. Primary Care and Sports Medicine [facility]  Adjunct Professor of Family Medicine  [employer]  Restaurant manager, fast food

## 2022-08-17 MED ORDER — CLOPIDOGREL BISULFATE 75 MG PO TABS: 75.0000 mg | ORAL_TABLET | Freq: Every day | ORAL | 3 refills | Status: DC

## 2022-08-17 NOTE — Progress Notes (Signed)
Pt requested that Plavix be sent to [facility] pharmacy

## 2022-08-17 NOTE — Addendum Note (Signed)
Addended by: Lars Mage on: 08/17/2022 10:15 AM   Modules accepted: Orders

## 2022-08-22 ENCOUNTER — Encounter: Payer: Self-pay | Admitting: Pulmonary Disease

## 2022-08-22 ENCOUNTER — Ambulatory Visit: Payer: PPO | Admitting: Pulmonary Disease

## 2022-08-22 VITALS — BP 130/72 | HR 80 | Temp 98.2°F | Ht 62.0 in | Wt 185.8 lb

## 2022-08-22 DIAGNOSIS — Z79899 Other long term (current) drug therapy: Secondary | ICD-10-CM

## 2022-08-22 DIAGNOSIS — J4551 Severe persistent asthma with (acute) exacerbation: Secondary | ICD-10-CM | POA: Diagnosis not present

## 2022-08-22 NOTE — Patient Instructions (Signed)
Glad you are doing well with your breathing Continue the inhalers and injection therapy Follow-up in 6 months

## 2022-08-22 NOTE — Progress Notes (Signed)
Tara Campbell    [id number]    [birth year]  Primary Care Physician:Campbell, Tara Kocher, MD  Referring Physician: Hali Marry, MD [address] [address] [place] [place],  [place] [address]  Chief complaint:   Follow up for asthma [name] in March 4773  HPI: 76 year old with severe persistent asthma, nasal polyposis, allergies She is on maximal therapy with LABA/LAMA/ICS, Singulair and is getting allergy shots with the allergist at [facility] to have significant symptoms with recurrent exacerbations.  She is needed to go on prednisone 4 times the past year. Has chronic dyspnea on exertion, wheeze  Pets: No pets Occupation: Used to work in Office manager Exposures: No mold, hot tub, Customer service manager.  No feather pillows or comforters Smoking history: Never smoker Travel history: Originally from [place].  Moved to [place] in 2017 Relevant family history: No significant family history of lung disease  Interim history: Continues on [place].  States that breathing is doing very well On Symbicort twice daily  She follows with Dr. Burt Knack for coronary artery disease.  Is due for heart catheterization in the near future due to increasing anginal symptoms.   Outpatient Encounter Medications as of 08/22/2022  Medication Sig   acetaminophen (TYLENOL) 650 MG CR tablet Take 1 tablet (650 mg total) by mouth every 8 (eight) hours as needed for pain.   albuterol (VENTOLIN HFA) 108 (90 Base) MCG/ACT inhaler Inhale 2 puffs into the lungs every 6 (six) hours as needed for wheezing.   allopurinol (ZYLOPRIM) 300 MG tablet Take 1 tablet (300 mg total) by mouth 2 (two) times daily.   apixaban (ELIQUIS) 5 MG TABS tablet Take 1 tablet (5 mg total) by mouth 2 (two) times daily.   atenolol (TENORMIN) 100 MG tablet Take 1 tablet (100 mg total) by mouth daily.   atorvastatin (LIPITOR) 80 MG tablet Take 1 tablet (80 mg total) by mouth at bedtime.   budesonide-formoterol  (SYMBICORT) 80-4.5 MCG/ACT inhaler Inhale 2 puffs into the lungs in the morning and at bedtime.   clopidogrel (PLAVIX) 75 MG tablet Take 1 tablet (75 mg total) by mouth daily.   Cyanocobalamin (B-12 PO) Take 1,000 mcg by mouth daily.   empagliflozin (JARDIANCE) 10 MG TABS tablet Take 1 tablet (10 mg total) by mouth daily before breakfast.   EPINEPHrine 0.3 mg/0.3 mL IJ SOAJ injection Inject 0.3 mg into the muscle as needed for anaphylaxis. Call 9-1-1 after use.   Evolocumab with Infusor ([place]) 420 MG/3.5ML SOCT Inject 420 mg into the skin every 30 (thirty) days.   ezetimibe (ZETIA) 10 MG tablet Take 1 tablet (10 mg total) by mouth daily.   fluticasone (FLONASE) 50 MCG/ACT nasal spray Place 1 spray into both nostrils daily.   glucose blood test strip To be used twice daily for testing blood sugars. E11.65   ipratropium-albuterol (DUONEB) 0.5-2.5 (3) MG/3ML SOLN Take 3 mLs by nebulization every 6 (six) hours as needed.   irbesartan-hydrochlorothiazide (AVALIDE) 300-12.5 MG tablet Take 0.5 tablets by mouth daily.   isosorbide mononitrate (IMDUR) 60 MG 24 hr tablet Take 1.5 tablets (90 mg total) by mouth daily.   levothyroxine (SYNTHROID) 125 MCG tablet Take 1 tablet (125 mcg total) by mouth daily before breakfast.   Mepolizumab ([name]) 100 MG/ML SOAJ Inject 1 mL (100 mg total) into the skin every 28 (twenty-eight) days.   metFORMIN (GLUCOPHAGE) 500 MG tablet Take 2 tablets (1,000 mg total) by mouth 2 (two) times daily  with a meal.   nitroGLYCERIN (NITROSTAT) 0.4 MG SL tablet Place 1 tablet (0.4 mg total) under the tongue every 5 (five) minutes as needed for chest pain (or tightness).   Omega-3 Fatty Acids (FISH OIL) 1000 MG CAPS Take 1 capsule (1,000 mg total) by mouth daily.   Semaglutide, 2 MG/DOSE, 8 MG/3ML SOPN Inject 2 mg as directed once a week.   Vitamin D, Cholecalciferol, 25 MCG (1000 UT) TABS Take 25 mcg by mouth daily in the afternoon.   zinc gluconate 50 MG tablet  Take 50 mg by mouth daily.   No facility-administered encounter medications on file as of 08/22/2022.    Physical Exam: Blood pressure 130/72, pulse 80, temperature 98.2 F (36.8 C), temperature source Oral, height 5\' 2"  (1.575 m), weight 185 lb 12.8 oz (84.3 kg), SpO2 97 %. Gen:      No acute distress HEENT:  EOMI, sclera anicteric Neck:     No masses; no thyromegaly Lungs:    Clear to auscultation bilaterally; normal respiratory effort CV:         Regular rate and rhythm; no murmurs Abd:      + bowel sounds; soft, non-tender; no palpable masses, no distension Ext:    No edema; adequate peripheral perfusion Skin:      Warm and dry; no rash Neuro: alert and oriented x 3 Psych: normal mood and affect   Data Reviewed: Imaging: Chest x-ray 08/16/2020-no acute cardiopulmonary disease.  Chest x-ray 04/25/2021-no active cardiopulmonary disease Chest x-ray 12/26/2021-no acute cardiopulmonary disease I have reviewed the images personally  PFTs: 11/17/2020 FVC 1.74 [62%], FEV1 1.17 (56%], F/F 67 No significant obstruction, restriction suggested by reduced lung volumes.  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  She has good response to biologic therapy Resume Symbicort.  Continue 80/4.5 twice daily  Plan/Recommendations: Continue Symbicort, nucala Follow-up in 6 months  11/19/2020 MD [facility] 08/22/2022, 3:33 PM  CC: 08/24/2022, *

## 2022-08-23 DIAGNOSIS — H18453 Nodular corneal degeneration, bilateral: Secondary | ICD-10-CM | POA: Diagnosis not present

## 2022-08-23 DIAGNOSIS — E119 Type 2 diabetes mellitus without complications: Secondary | ICD-10-CM | POA: Diagnosis not present

## 2022-08-23 DIAGNOSIS — H524 Presbyopia: Secondary | ICD-10-CM | POA: Diagnosis not present

## 2022-08-23 DIAGNOSIS — H40003 Preglaucoma, unspecified, bilateral: Secondary | ICD-10-CM | POA: Diagnosis not present

## 2022-08-23 DIAGNOSIS — H43393 Other vitreous opacities, bilateral: Secondary | ICD-10-CM | POA: Diagnosis not present

## 2022-08-23 DIAGNOSIS — H02403 Unspecified ptosis of bilateral eyelids: Secondary | ICD-10-CM | POA: Diagnosis not present

## 2022-08-23 LAB — HM DIABETES EYE EXAM

## 2022-08-26 NOTE — H&P (View-Only) (Signed)
Cardiology Office Note   Date:  08/27/2022   ID:  Tara Campbell, DOB 03-03-1946, MRN [id number]  PCP:  Agapito Games, MD    No chief complaint on file.  CAD  Wt Readings from Last 3 Encounters:  08/27/22 184 lb (83.5 kg)  08/22/22 185 lb 12.8 oz (84.3 kg)  06/18/22 186 lb (84.4 kg)       History of Present Illness: Tara Campbell is a 76 y.o. female  with CAD.  Prior stents in the RCA and circumflex.   Cath in 04/2021 showed: "Severe complex stenosis of the LAD in the proximal, mid, and distal vessel. 2.  Patent left circumflex no significant stenosis 3.  Patent left main with no significant stenosis 4.  Patent RCA with patent RCA stents, mild nonobstructive disease present with less than 50% stenosis 5.  Normal LVEDP   Recommendations: The patient has severe diffuse LAD stenoses.  The vessel is unfavorable for PCI with heavy calcification, marked tortuosity, and small vessel caliber.  I would favor medical therapy as an initial approach.  If she fails medical therapy would be reasonable to attempt PCI of the proximal and mid vessel with technical concerns noted above."  Seen as DOD visit for pre cath eval.  Increasing shortness of breath over the past 4 months.  Tried cardiac rehab 1 year ago.  With walking, she can feel chest tightness, but it is relieved with onenitroglycerin.  Walking in the house does not seem to cause problems.   Tightness can be worse with lying down.  Never used a more than 1 nitroglycerin at a time.  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   Otomycosis of right ear 09/27/2017   Paroxysmal atrial fibrillation (HCC)    Thyroid disease      Past Surgical History:  Procedure Laterality Date   ANGIOPLASTY     CARDIAC CATHETERIZATION     COLONOSCOPY W/ POLYPECTOMY  01/04/2015   HEMORRHOID BANDING     LEFT HEART CATH AND CORONARY ANGIOGRAPHY N/A 04/26/2021   Procedure: LEFT HEART CATH AND CORONARY ANGIOGRAPHY;  Surgeon: Tonny Bollman, MD;  Location: [facility] INVASIVE CV LAB;  Service: Cardiovascular;  Laterality: N/A;     Current Outpatient Medications  Medication Sig Dispense Refill   acetaminophen (TYLENOL) 650 MG CR tablet Take 1 tablet (650 mg total) by mouth every 8 (eight) hours as needed for pain. 90 tablet 3   albuterol (VENTOLIN HFA) 108 (90 Base) MCG/ACT inhaler Inhale 2 puffs into the lungs every 6 (six) hours as needed for wheezing. 18 g prn   allopurinol (ZYLOPRIM) 300 MG tablet Take 1 tablet (300 mg total) by mouth 2 (two) times daily. 180 tablet 1   apixaban (ELIQUIS) 5 MG TABS tablet Take 1 tablet (5 mg total) by mouth 2 (two) times daily. 180 tablet 1   atenolol (TENORMIN) 100 MG tablet Take 1 tablet (100 mg total) by mouth daily. 90 tablet 1   atorvastatin (LIPITOR) 80 MG tablet Take 1 tablet (80 mg total) by mouth at bedtime. 90 tablet 3  budesonide-formoterol (SYMBICORT) 80-4.5 MCG/ACT inhaler Inhale 2 puffs into the lungs in the morning and at bedtime. 10.2 g 5   clopidogrel (PLAVIX) 75 MG tablet Take 1 tablet (75 mg total) by mouth daily. 90 tablet 3   Cyanocobalamin (B-12 PO) Take 1,000 mcg by mouth daily.     empagliflozin (JARDIANCE) 10 MG TABS tablet Take 1 tablet (10 mg total) by mouth daily before breakfast. 90 tablet 1   EPINEPHrine 0.3 mg/0.3 mL IJ SOAJ injection Inject 0.3 mg into the muscle as needed for anaphylaxis. Call 9-1-1 after use. 1 each 2   Evolocumab with Infusor ([place]) 420 MG/3.5ML SOCT Inject 420 mg into the skin every 30 (thirty) days. 3.6 mL 11   ezetimibe (ZETIA) 10 MG tablet Take 1 tablet (10 mg total) by mouth daily. 90 tablet 3   fluticasone (FLONASE) 50  MCG/ACT nasal spray Place 1 spray into both nostrils daily. 16 g 3   glucose blood test strip To be used twice daily for testing blood sugars. E11.65 200 each 11   ipratropium-albuterol (DUONEB) 0.5-2.5 (3) MG/3ML SOLN Take 3 mLs by nebulization every 6 (six) hours as needed. 3 mL PRN   irbesartan-hydrochlorothiazide (AVALIDE) 300-12.5 MG tablet Take 0.5 tablets by mouth daily. 45 tablet 1   isosorbide mononitrate (IMDUR) 60 MG 24 hr tablet Take 1.5 tablets (90 mg total) by mouth daily. 180 tablet 2   levothyroxine (SYNTHROID) 125 MCG tablet Take 1 tablet (125 mcg total) by mouth daily before breakfast. 90 tablet 0   Mepolizumab ([name]) 100 MG/ML SOAJ Inject 1 mL (100 mg total) into the skin every 28 (twenty-eight) days. 1 mL 4   metFORMIN (GLUCOPHAGE) 500 MG tablet Take 2 tablets (1,000 mg total) by mouth 2 (two) times daily with a meal. 180 tablet 3   nitroGLYCERIN (NITROSTAT) 0.4 MG SL tablet Place 1 tablet (0.4 mg total) under the tongue every 5 (five) minutes as needed for chest pain (or tightness). 25 tablet prn   Omega-3 Fatty Acids (FISH OIL) 1000 MG CAPS Take 1 capsule (1,000 mg total) by mouth daily. 90 capsule 3   Semaglutide, 2 MG/DOSE, 8 MG/3ML SOPN Inject 2 mg as directed once a week. 9 mL 1   Vitamin D, Cholecalciferol, 25 MCG (1000 UT) TABS Take 25 mcg by mouth daily in the afternoon. 60 tablet    zinc gluconate 50 MG tablet Take 50 mg by mouth daily.     No current facility-administered medications for this visit.    Allergies:   Latex and Sulfa antibiotics    Social History:  The patient  reports that she has never smoked. She has never used smokeless tobacco. She reports current alcohol use. She reports that she does not use drugs.   Family History:  The patient's family history includes Alzheimer's disease in her mother; Diabetes in her father and paternal grandmother; Glaucoma in her father; Heart disease in her father; Hyperlipidemia in her father and mother; Hypertension  in her father and mother; Skin cancer in her father; Vascular Disease in her mother.    ROS:  Please see the history of present illness.   Otherwise, review of systems are positive for increasing angina.   All other systems are reviewed and negative.    PHYSICAL EXAM: VS:  BP 138/82   Pulse 95   Ht 5\' 2"  (1.575 m)   Wt 184 lb (83.5 kg)   SpO2 96%   BMI 33.65 kg/m  , BMI Body mass index  is 33.65 kg/m. GEN: Well nourished, well developed, in no acute distress HEENT: normal Neck: no JVD, carotid bruits, or masses Cardiac: RRR; no murmurs, rubs, or gallops,no edema  Respiratory:  clear to auscultation bilaterally, normal work of breathing GI: soft, nontender, nondistended, + BS MS: no deformity or atrophy Skin: warm and dry, no rash Neuro:  Strength and sensation are intact Psych: euthymic mood, full affect   EKG:   The ekg ordered today demonstrates normal ECG   Recent Labs: 09/04/2021: ALT 12 12/07/2021: TSH 6.17 05/10/2022: BUN 15; Creatinine, Ser 0.92; Hemoglobin 12.0; Platelets 284; Potassium 4.5; Sodium 140   Lipid Panel    Component Value Date/Time   CHOL 270 (H) 05/10/2022 1105   TRIG 114 05/10/2022 1105   HDL 65 05/10/2022 1105   CHOLHDL 4.2 05/10/2022 1105   VLDL 23 05/10/2022 1105   LDLCALC 182 (H) 05/10/2022 1105   LDLCALC 68 09/04/2021 0958     Other studies Reviewed: Additional studies/ records that were reviewed today with results demonstrating: labs reviewed, normal renal function.   ASSESSMENT AND PLAN:  CAD: Known LAD disease.  Increasing angina while on aggressive medical therapy.  All questions about cath were answered.  She is agreeable to have the procedure done. HTN: Borderline control today.  Continue with healthy lifestyle including healthy diet.  Hopefully after cath, can come up with a plan for regular exercise Hyperlipidemia: Continue atorvastatin.  Repatha was started but she is having trouble with the injection. She does better with  Ozempic. She would like to try to use Repatha dosed every 2 weeks as opposed to every month.  We will check with Pharm.D. as to whether a different delivery system would be possible to use. Probably mild hives in the past with latex so not certain that it would be harmful to use a different delivery system.  Current system does not seal and medication ends up leaking.  The patient understands that risks include but are not limited to stroke (1 in 1000), death (1 in 20), kidney failure [usually temporary] (1 in 500), bleeding (1 in 200), allergic reaction [possibly serious] (1 in 200), and agrees to proceed.     Current medicines are reviewed at length with the patient today.  The patient concerns regarding her medicines were addressed.  The following changes have been made:  No change  Labs/ tests ordered today include:  No orders of the defined types were placed in this encounter.   Recommend 150 minutes/week of aerobic exercise Low fat, low carb, high fiber diet recommended  Disposition:   FU with Dr. Burt Knack   Signed, Larae Grooms, MD  08/27/2022 10:50 AM    [address], [place], [place]  [address] Phone: [phone]; Fax: [phone]

## 2022-08-26 NOTE — Progress Notes (Signed)
  Cardiology Office Note   Date:  08/27/2022   ID:  Tara Campbell, DOB 05/12/1946, MRN [id number]  PCP:  Campbell, Tara D, MD    No chief complaint on file.  CAD  Wt Readings from Last 3 Encounters:  08/27/22 184 lb (83.5 kg)  08/22/22 185 lb 12.8 oz (84.3 kg)  06/18/22 186 lb (84.4 kg)       History of Present Illness: Tara Campbell is a 76 y.o. female  with CAD.  Prior stents in the RCA and circumflex.   Cath in 04/2021 showed: "Severe complex stenosis of the LAD in the proximal, mid, and distal vessel. 2.  Patent left circumflex no significant stenosis 3.  Patent left main with no significant stenosis 4.  Patent RCA with patent RCA stents, mild nonobstructive disease present with less than 50% stenosis 5.  Normal LVEDP   Recommendations: The patient has severe diffuse LAD stenoses.  The vessel is unfavorable for PCI with heavy calcification, marked tortuosity, and small vessel caliber.  I would favor medical therapy as an initial approach.  If she fails medical therapy would be reasonable to attempt PCI of the proximal and mid vessel with technical concerns noted above."  Seen as DOD visit for pre cath eval.  Increasing shortness of breath over the past 4 months.  Tried cardiac rehab 1 year ago.  With walking, she can feel chest tightness, but it is relieved with onenitroglycerin.  Walking in the house does not seem to cause problems.   Tightness can be worse with lying down.  Never used a more than 1 nitroglycerin at a time.  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   Otomycosis of right ear 09/27/2017   Paroxysmal atrial fibrillation (HCC)    Thyroid disease      Past Surgical History:  Procedure Laterality Date   ANGIOPLASTY     CARDIAC CATHETERIZATION     COLONOSCOPY W/ POLYPECTOMY  01/04/2015   HEMORRHOID BANDING     LEFT HEART CATH AND CORONARY ANGIOGRAPHY N/A 04/26/2021   Procedure: LEFT HEART CATH AND CORONARY ANGIOGRAPHY;  Surgeon: Cooper, Michael, MD;  Location: [facility];  Service: Cardiovascular;  Laterality: N/A;     Current Outpatient Medications  Medication Sig Dispense Refill   acetaminophen (TYLENOL) 650 MG CR tablet Take 1 tablet (650 mg total) by mouth every 8 (eight) hours as needed for pain. 90 tablet 3   albuterol (VENTOLIN HFA) 108 (90 Base) MCG/ACT inhaler Inhale 2 puffs into the lungs every 6 (six) hours as needed for wheezing. 18 g prn   allopurinol (ZYLOPRIM) 300 MG tablet Take 1 tablet (300 mg total) by mouth 2 (two) times daily. 180 tablet 1   apixaban (ELIQUIS) 5 MG TABS tablet Take 1 tablet (5 mg total) by mouth 2 (two) times daily. 180 tablet 1   atenolol (TENORMIN) 100 MG tablet Take 1 tablet (100 mg total) by mouth daily. 90 tablet 1   atorvastatin (LIPITOR) 80 MG tablet Take 1 tablet (80 mg total) by mouth at bedtime. 90 tablet 3     budesonide-formoterol (SYMBICORT) 80-4.5 MCG/ACT inhaler Inhale 2 puffs into the lungs in the morning and at bedtime. 10.2 g 5   clopidogrel (PLAVIX) 75 MG tablet Take 1 tablet (75 mg total) by mouth daily. 90 tablet 3   Cyanocobalamin (B-12 PO) Take 1,000 mcg by mouth daily.     empagliflozin (JARDIANCE) 10 MG TABS tablet Take 1 tablet (10 mg total) by mouth daily before breakfast. 90 tablet 1   EPINEPHrine 0.3 mg/0.3 mL IJ SOAJ injection Inject 0.3 mg into the muscle as needed for anaphylaxis. Call 9-1-1 after use. 1 each 2   Evolocumab with Infusor ([place]) 420 MG/3.5ML SOCT Inject 420 mg into the skin every 30 (thirty) days. 3.6 mL 11   ezetimibe (ZETIA) 10 MG tablet Take 1 tablet (10 mg total) by mouth daily. 90 tablet 3   fluticasone (FLONASE) 50  MCG/ACT nasal spray Place 1 spray into both nostrils daily. 16 g 3   glucose blood test strip To be used twice daily for testing blood sugars. E11.65 200 each 11   ipratropium-albuterol (DUONEB) 0.5-2.5 (3) MG/3ML SOLN Take 3 mLs by nebulization every 6 (six) hours as needed. 3 mL PRN   irbesartan-hydrochlorothiazide (AVALIDE) 300-12.5 MG tablet Take 0.5 tablets by mouth daily. 45 tablet 1   isosorbide mononitrate (IMDUR) 60 MG 24 hr tablet Take 1.5 tablets (90 mg total) by mouth daily. 180 tablet 2   levothyroxine (SYNTHROID) 125 MCG tablet Take 1 tablet (125 mcg total) by mouth daily before breakfast. 90 tablet 0   Mepolizumab ([name]) 100 MG/ML SOAJ Inject 1 mL (100 mg total) into the skin every 28 (twenty-eight) days. 1 mL 4   metFORMIN (GLUCOPHAGE) 500 MG tablet Take 2 tablets (1,000 mg total) by mouth 2 (two) times daily with a meal. 180 tablet 3   nitroGLYCERIN (NITROSTAT) 0.4 MG SL tablet Place 1 tablet (0.4 mg total) under the tongue every 5 (five) minutes as needed for chest pain (or tightness). 25 tablet prn   Omega-3 Fatty Acids (FISH OIL) 1000 MG CAPS Take 1 capsule (1,000 mg total) by mouth daily. 90 capsule 3   Semaglutide, 2 MG/DOSE, 8 MG/3ML SOPN Inject 2 mg as directed once a week. 9 mL 1   Vitamin D, Cholecalciferol, 25 MCG (1000 UT) TABS Take 25 mcg by mouth daily in the afternoon. 60 tablet    zinc gluconate 50 MG tablet Take 50 mg by mouth daily.     No current facility-administered medications for this visit.    Allergies:   Latex and Sulfa antibiotics    Social History:  The patient  reports that she has never smoked. She has never used smokeless tobacco. She reports current alcohol use. She reports that she does not use drugs.   Family History:  The patient's family history includes Alzheimer's disease in her mother; Diabetes in her father and paternal grandmother; Glaucoma in her father; Heart disease in her father; Hyperlipidemia in her father and mother; Hypertension  in her father and mother; Skin cancer in her father; Vascular Disease in her mother.    ROS:  Please see the history of present illness.   Otherwise, review of systems are positive for increasing angina.   All other systems are reviewed and negative.    PHYSICAL EXAM: VS:  BP 138/82   Pulse 95   Ht 5\' 2"  (1.575 m)   Wt 184 lb (83.5 kg)   SpO2 96%   BMI 33.65 kg/m  , BMI Body mass index  is 33.65 kg/m. GEN: Well nourished, well developed, in no acute distress HEENT: normal Neck: no JVD, carotid bruits, or masses Cardiac: RRR; no murmurs, rubs, or gallops,no edema  Respiratory:  clear to auscultation bilaterally, normal work of breathing GI: soft, nontender, nondistended, + BS MS: no deformity or atrophy Skin: warm and dry, no rash Neuro:  Strength and sensation are intact Psych: euthymic mood, full affect   EKG:   The ekg ordered today demonstrates normal ECG   Recent Labs: 09/04/2021: ALT 12 12/07/2021: TSH 6.17 05/10/2022: BUN 15; Creatinine, Ser 0.92; Hemoglobin 12.0; Platelets 284; Potassium 4.5; Sodium 140   Lipid Panel    Component Value Date/Time   CHOL 270 (H) 05/10/2022 1105   TRIG 114 05/10/2022 1105   HDL 65 05/10/2022 1105   CHOLHDL 4.2 05/10/2022 1105   VLDL 23 05/10/2022 1105   LDLCALC 182 (H) 05/10/2022 1105   LDLCALC 68 09/04/2021 0958     Other studies Reviewed: Additional studies/ records that were reviewed today with results demonstrating: labs reviewed, normal renal function.   ASSESSMENT AND PLAN:  CAD: Known LAD disease.  Increasing angina while on aggressive medical therapy.  All questions about cath were answered.  She is agreeable to have the procedure done. HTN: Borderline control today.  Continue with healthy lifestyle including healthy diet.  Hopefully after cath, can come up with a plan for regular exercise Hyperlipidemia: Continue atorvastatin.  Repatha was started but she is having trouble with the injection. She does better with  Ozempic. She would like to try to use Repatha dosed every 2 weeks as opposed to every month.  We will check with Pharm.D. as to whether a different delivery system would be possible to use. Probably mild hives in the past with latex so not certain that it would be harmful to use a different delivery system.  Current system does not seal and medication ends up leaking.  The patient understands that risks include but are not limited to stroke (1 in 1000), death (1 in 46), kidney failure [usually temporary] (1 in 500), bleeding (1 in 200), allergic reaction [possibly serious] (1 in 200), and agrees to proceed.     Current medicines are reviewed at length with the patient today.  The patient concerns regarding her medicines were addressed.  The following changes have been made:  No change  Labs/ tests ordered today include:  No orders of the defined types were placed in this encounter.   Recommend 150 minutes/week of aerobic exercise Low fat, low carb, high fiber diet recommended  Disposition:   FU with Dr. Burt Knack   Signed, Larae Grooms, MD  08/27/2022 10:50 AM    [facility], [place], [place]  [address] Phone: [phone]; Fax: [phone]

## 2022-08-27 ENCOUNTER — Ambulatory Visit: Payer: PPO | Attending: Interventional Cardiology | Admitting: Interventional Cardiology

## 2022-08-27 ENCOUNTER — Encounter: Payer: Self-pay | Admitting: Interventional Cardiology

## 2022-08-27 ENCOUNTER — Ambulatory Visit: Payer: PPO | Admitting: Neurology

## 2022-08-27 VITALS — BP 138/82 | HR 95 | Ht 62.0 in | Wt 184.0 lb

## 2022-08-27 DIAGNOSIS — E782 Mixed hyperlipidemia: Secondary | ICD-10-CM

## 2022-08-27 DIAGNOSIS — I25119 Atherosclerotic heart disease of native coronary artery with unspecified angina pectoris: Secondary | ICD-10-CM

## 2022-08-27 DIAGNOSIS — I10 Essential (primary) hypertension: Secondary | ICD-10-CM

## 2022-08-27 LAB — BASIC METABOLIC PANEL
BUN/Creatinine Ratio: 12 (ref 12–28)
BUN: 12 mg/dL (ref 8–27)
CO2: 27 mmol/L (ref 20–29)
Calcium: 10.4 mg/dL — ABNORMAL HIGH (ref 8.7–10.3)
Chloride: 103 mmol/L (ref 96–106)
Creatinine, Ser: 1.04 mg/dL — ABNORMAL HIGH (ref 0.57–1.00)
Glucose: 125 mg/dL — ABNORMAL HIGH (ref 70–99)
Potassium: 4.3 mmol/L (ref 3.5–5.2)
Sodium: 141 mmol/L (ref 134–144)
eGFR: 55 mL/min/{1.73_m2} — ABNORMAL LOW (ref >59)

## 2022-08-27 LAB — CBC
Hematocrit: 40 % (ref 34.0–46.6)
Hemoglobin: 13.5 g/dL (ref 11.1–15.9)
MCH: 29.9 pg (ref 26.6–33.0)
MCHC: 33.8 g/dL (ref 31.5–35.7)
MCV: 89 fL (ref 79–97)
Platelets: 290 10*3/uL (ref 150–450)
RBC: 4.52 x10E6/uL (ref 3.77–5.28)
RDW: 14 % (ref 11.7–15.4)
WBC: 9 10*3/uL (ref 3.4–10.8)

## 2022-08-27 NOTE — Patient Instructions (Signed)
Medication Instructions:  Your physician recommends that you continue on your current medications as directed. Please refer to the Current Medication list given to you today.  *If you need a refill on your cardiac medications before your next appointment, please call your pharmacy*   Lab Work: Lab work to be done today--CBC and BMP If you have labs (blood work) drawn today and your tests are completely normal, you will receive your results only by: [place] (if you have MyChart) OR A paper copy in the mail If you have any lab test that is abnormal or we need to change your treatment, we will call you to review the results.   Testing/Procedures: Your physician has requested that you have a cardiac catheterization. Cardiac catheterization is used to diagnose and/or treat various heart conditions. Doctors may recommend this procedure for a number of different reasons. The most common reason is to evaluate chest pain. Chest pain can be a symptom of coronary artery disease (CAD), and cardiac catheterization can show whether plaque is narrowing or blocking your heart's arteries. This procedure is also used to evaluate the valves, as well as measure the blood flow and oxygen levels in different parts of your heart. For further information please visit [email or web address]. Please follow instruction sheet, as given.    Follow-Up: At [facility], you and your health needs are our priority.  As part of our continuing mission to provide you with exceptional heart care, we have created designated Provider Care Teams.  These Care Teams include your primary Cardiologist (physician) and Advanced Practice Providers (APPs -  Physician Assistants and Nurse Practitioners) who all work together to provide you with the care you need, when you need it.  We recommend signing up for the patient portal called "MyChart".  Sign up information is provided on this After Visit Summary.  MyChart is used to  connect with patients for Virtual Visits (Telemedicine).  Patients are able to view lab/test results, encounter notes, upcoming appointments, etc.  Non-urgent messages can be sent to your provider as well.   To learn more about what you can do with MyChart, go to [email or web address].    Your next appointment:   To be arranged after procedure  The format for your next appointment:   In Person  Provider:   Dr Aundra Dubin   Other Instructions    Important Information About Sugar

## 2022-08-29 ENCOUNTER — Telehealth: Payer: Self-pay | Admitting: *Deleted

## 2022-08-29 NOTE — Telephone Encounter (Addendum)
Cardiac Catheterization scheduled at [facility] for: Friday August 31, 2022 9 AM Arrival time and place: [facility] Main Entrance A at: 7 AM  Nothing to eat after midnight prior to procedure, clear liquids until 5 AM day of procedure.  Medication instructions: -Hold:  Eliquis-none 08/29/22 until post procedure   Metformin-day of procedure and 48 hours post procedure  Jardiance-AM of procedure  Ozempic-weekly on Fridays-will take day after procedure  Irbesartan/HCTZ-day before and day of procedure-per protocol GFR 55 -Except hold medications usual morning medications can be taken with sips of water including aspirin 81 mg and Plavix 75 mg  Confirmed patient has responsible adult to drive home post procedure and be with patient first 24 hours after arriving home.  Patient reports no new symptoms concerning for COVID-19 in the past 10 days.  Reviewed procedure instructions with patient.

## 2022-08-31 ENCOUNTER — Encounter (HOSPITAL_COMMUNITY)
Admission: RE | Disposition: A | Discharge status: Home / Self Care | Payer: Self-pay | Source: Home / Self Care | Attending: Cardiovascular Disease

## 2022-08-31 ENCOUNTER — Ambulatory Visit (HOSPITAL_COMMUNITY)
Admission: RE | Admit: 2022-08-31 | Discharge: 2022-08-31 | Disposition: A | Discharge status: Home / Self Care | Payer: PPO | Attending: Cardiovascular Disease | Admitting: Cardiovascular Disease

## 2022-08-31 ENCOUNTER — Other Ambulatory Visit: Payer: Self-pay

## 2022-08-31 ENCOUNTER — Ambulatory Visit: Payer: PPO | Admitting: Family Medicine

## 2022-08-31 DIAGNOSIS — I25110 Atherosclerotic heart disease of native coronary artery with unstable angina pectoris: Secondary | ICD-10-CM | POA: Insufficient documentation

## 2022-08-31 DIAGNOSIS — Z79899 Other long term (current) drug therapy: Secondary | ICD-10-CM | POA: Diagnosis not present

## 2022-08-31 DIAGNOSIS — Z955 Presence of coronary angioplasty implant and graft: Secondary | ICD-10-CM | POA: Diagnosis not present

## 2022-08-31 DIAGNOSIS — J45909 Unspecified asthma, uncomplicated: Secondary | ICD-10-CM | POA: Diagnosis not present

## 2022-08-31 DIAGNOSIS — Z7902 Long term (current) use of antithrombotics/antiplatelets: Secondary | ICD-10-CM | POA: Diagnosis not present

## 2022-08-31 DIAGNOSIS — I2584 Coronary atherosclerosis due to calcified coronary lesion: Secondary | ICD-10-CM | POA: Insufficient documentation

## 2022-08-31 DIAGNOSIS — I10 Essential (primary) hypertension: Secondary | ICD-10-CM | POA: Insufficient documentation

## 2022-08-31 DIAGNOSIS — E785 Hyperlipidemia, unspecified: Secondary | ICD-10-CM | POA: Diagnosis not present

## 2022-08-31 DIAGNOSIS — I252 Old myocardial infarction: Secondary | ICD-10-CM | POA: Insufficient documentation

## 2022-08-31 HISTORY — PX: CORONARY STENT INTERVENTION: CATH118234

## 2022-08-31 LAB — POCT ACTIVATED CLOTTING TIME
Activated Clotting Time: 293 seconds
Activated Clotting Time: 293 seconds

## 2022-08-31 LAB — GLUCOSE, CAPILLARY
Glucose-Capillary: 128 mg/dL — ABNORMAL HIGH (ref 70–99)
Glucose-Capillary: 118 mg/dL — ABNORMAL HIGH (ref 70–99)

## 2022-08-31 SURGERY — CORONARY STENT INTERVENTION
Anesthesia: LOCAL

## 2022-08-31 MED ORDER — DIAZEPAM 5 MG PO TABS: 5.0000 mg | ORAL_TABLET | Freq: Four times a day (QID) | ORAL | Status: DC | PRN

## 2022-08-31 MED ORDER — NITROGLYCERIN 0.4 MG SL SUBL
SUBLINGUAL_TABLET | SUBLINGUAL | Status: AC
  Filled 2022-08-31: qty 1

## 2022-08-31 MED ORDER — SODIUM CHLORIDE 0.9 % IV SOLN: 250.0000 mL | INTRAVENOUS | Status: DC | PRN

## 2022-08-31 MED ORDER — ONDANSETRON HCL 4 MG/2ML IJ SOLN: 4.0000 mg | Freq: Four times a day (QID) | INTRAMUSCULAR | Status: DC | PRN

## 2022-08-31 MED ORDER — MIDAZOLAM HCL 2 MG/2ML IJ SOLN
INTRAMUSCULAR | Status: AC
  Filled 2022-08-31: qty 2

## 2022-08-31 MED ORDER — SODIUM CHLORIDE 0.9% FLUSH
3.0000 mL | INTRAVENOUS | Status: DC | PRN
  Administered 2022-08-31: 3 mL via INTRAVENOUS

## 2022-08-31 MED ORDER — CLOPIDOGREL BISULFATE 75 MG PO TABS
75.0000 mg | ORAL_TABLET | ORAL | Status: AC
  Administered 2022-08-31: 75 mg via ORAL

## 2022-08-31 MED ORDER — SODIUM CHLORIDE 0.9 % WEIGHT BASED INFUSION: 1.0000 mL/kg/h | INTRAVENOUS | Status: DC

## 2022-08-31 MED ORDER — MIDAZOLAM HCL 2 MG/2ML IJ SOLN
INTRAMUSCULAR | Status: DC | PRN
  Administered 2022-08-31 (×2): 1 mg via INTRAVENOUS
  Administered 2022-08-31: 2 mg via INTRAVENOUS

## 2022-08-31 MED ORDER — IOHEXOL 350 MG/ML SOLN
INTRAVENOUS | Status: DC | PRN
  Administered 2022-08-31: 125 mL

## 2022-08-31 MED ORDER — ACETAMINOPHEN 325 MG PO TABS: 650.0000 mg | ORAL_TABLET | ORAL | Status: DC | PRN

## 2022-08-31 MED ORDER — SODIUM CHLORIDE 0.9 % WEIGHT BASED INFUSION
3.0000 mL/kg/h | INTRAVENOUS | Status: AC
  Administered 2022-08-31: 3 mL/kg/h via INTRAVENOUS

## 2022-08-31 MED ORDER — FENTANYL CITRATE (PF) 100 MCG/2ML IJ SOLN
INTRAMUSCULAR | Status: AC
  Filled 2022-08-31: qty 2

## 2022-08-31 MED ORDER — LABETALOL HCL 5 MG/ML IV SOLN
10.0000 mg | INTRAVENOUS | Status: DC | PRN
  Filled 2022-08-31: qty 4

## 2022-08-31 MED ORDER — VERAPAMIL HCL 2.5 MG/ML IV SOLN
INTRAVENOUS | Status: AC
  Filled 2022-08-31: qty 2

## 2022-08-31 MED ORDER — NITROGLYCERIN 0.4 MG SL SUBL
0.4000 mg | SUBLINGUAL_TABLET | SUBLINGUAL | Status: DC | PRN
  Administered 2022-08-31 (×2): 0.4 mg via SUBLINGUAL
  Filled 2022-08-31: qty 1

## 2022-08-31 MED ORDER — LIDOCAINE HCL (PF) 1 % IJ SOLN
INTRAMUSCULAR | Status: AC
  Filled 2022-08-31: qty 30

## 2022-08-31 MED ORDER — SODIUM CHLORIDE 0.9% FLUSH: 3.0000 mL | Freq: Two times a day (BID) | INTRAVENOUS | Status: DC

## 2022-08-31 MED ORDER — VERAPAMIL HCL 2.5 MG/ML IV SOLN
INTRAVENOUS | Status: DC | PRN
  Administered 2022-08-31: 10 mL via INTRA_ARTERIAL

## 2022-08-31 MED ORDER — SODIUM CHLORIDE 0.9% FLUSH: 3.0000 mL | INTRAVENOUS | Status: DC | PRN

## 2022-08-31 MED ORDER — HEPARIN SODIUM (PORCINE) 1000 UNIT/ML IJ SOLN
INTRAMUSCULAR | Status: DC | PRN
  Administered 2022-08-31: 8000 [IU] via INTRAVENOUS
  Administered 2022-08-31: 2000 [IU] via INTRAVENOUS

## 2022-08-31 MED ORDER — HEPARIN SODIUM (PORCINE) 1000 UNIT/ML IJ SOLN
INTRAMUSCULAR | Status: AC
  Filled 2022-08-31: qty 10

## 2022-08-31 MED ORDER — MORPHINE SULFATE (PF) 2 MG/ML IV SOLN: 2.0000 mg | INTRAVENOUS | Status: DC | PRN

## 2022-08-31 MED ORDER — HYDRALAZINE HCL 20 MG/ML IJ SOLN: 10.0000 mg | INTRAMUSCULAR | Status: DC | PRN

## 2022-08-31 MED ORDER — ATENOLOL 100 MG PO TABS
100.0000 mg | ORAL_TABLET | ORAL | Status: AC
  Administered 2022-08-31: 100 mg via ORAL
  Filled 2022-08-31 (×2): qty 1

## 2022-08-31 MED ORDER — ASPIRIN 81 MG PO CHEW: 81.0000 mg | CHEWABLE_TABLET | Freq: Every day | ORAL | Status: DC

## 2022-08-31 MED ORDER — ASPIRIN 81 MG PO CHEW
81.0000 mg | CHEWABLE_TABLET | ORAL | Status: AC
  Administered 2022-08-31: 81 mg via ORAL

## 2022-08-31 MED ORDER — FENTANYL CITRATE (PF) 100 MCG/2ML IJ SOLN
INTRAMUSCULAR | Status: DC | PRN
  Administered 2022-08-31 (×2): 25 ug via INTRAVENOUS
  Administered 2022-08-31: 50 ug via INTRAVENOUS

## 2022-08-31 MED ORDER — OXYCODONE HCL 5 MG PO TABS: 5.0000 mg | ORAL_TABLET | ORAL | Status: DC | PRN

## 2022-08-31 MED ORDER — LABETALOL HCL 5 MG/ML IV SOLN
10.0000 mg | INTRAVENOUS | Status: DC | PRN
  Administered 2022-08-31: 10 mg via INTRAVENOUS

## 2022-08-31 MED ORDER — ASPIRIN 81 MG PO TBEC: 81.0000 mg | DELAYED_RELEASE_TABLET | Freq: Every day | ORAL | 11 refills | Status: DC

## 2022-08-31 MED ORDER — HEPARIN (PORCINE) IN NACL 1000-0.9 UT/500ML-% IV SOLN
INTRAVENOUS | Status: DC | PRN
  Administered 2022-08-31 (×2): 500 mL

## 2022-08-31 MED ORDER — NITROGLYCERIN 1 MG/10 ML FOR IR/CATH LAB
INTRA_ARTERIAL | Status: DC | PRN
  Administered 2022-08-31: 150 ug via INTRACORONARY
  Administered 2022-08-31 (×2): 200 ug via INTRACORONARY
  Administered 2022-08-31: 150 ug via INTRACORONARY

## 2022-08-31 MED ORDER — HEPARIN (PORCINE) IN NACL 1000-0.9 UT/500ML-% IV SOLN
INTRAVENOUS | Status: AC
  Filled 2022-08-31: qty 1000

## 2022-08-31 MED ORDER — LIDOCAINE HCL (PF) 1 % IJ SOLN
INTRAMUSCULAR | Status: DC | PRN
  Administered 2022-08-31: 2 mL

## 2022-08-31 MED ORDER — NITROGLYCERIN 1 MG/10 ML FOR IR/CATH LAB
INTRA_ARTERIAL | Status: AC
  Filled 2022-08-31: qty 10

## 2022-08-31 SURGICAL SUPPLY — 20 items
BALL SAPPHIRE NC24 2.25X12 (BALLOONS) ×1
BALL SAPPHIRE NC24 2.75X15 (BALLOONS) ×1
BALLN SAPPHIRE 2.0X15 (BALLOONS) ×1
BALLOON SAPPHIRE 2.0X15 (BALLOONS) IMPLANT
BALLOON SAPPHIRE NC24 2.25X12 (BALLOONS) IMPLANT
BALLOON SAPPHIRE NC24 2.75X15 (BALLOONS) IMPLANT
BAND ZEPHYR COMPRESS 30 LONG (HEMOSTASIS) IMPLANT
CATH INFINITI JR4 5F (CATHETERS) IMPLANT
CATH VISTA GUIDE 6FR XBLAD3.5 (CATHETERS) IMPLANT
GLIDESHEATH SLEND SS 6F .021 (SHEATH) IMPLANT
GUIDEWIRE INQWIRE 1.5J.035X260 (WIRE) IMPLANT
INQWIRE 1.5J .035X260CM (WIRE) ×1
KIT ENCORE 26 ADVANTAGE (KITS) IMPLANT
KIT HEART LEFT (KITS) ×1 IMPLANT
PACK CARDIAC CATHETERIZATION (CUSTOM PROCEDURE TRAY) ×1 IMPLANT
STENT ONYX FRONTIER 2.5X22 (Permanent Stent) IMPLANT
TRANSDUCER W/STOPCOCK (MISCELLANEOUS) ×1 IMPLANT
TUBING CIL FLEX 10 FLL-RA (TUBING) ×1 IMPLANT
WIRE COUGAR XT STRL 190CM (WIRE) IMPLANT
WIRE HI TORQ WHISPER MS 190CM (WIRE) IMPLANT

## 2022-08-31 NOTE — Discharge Summary (Signed)
Discharge Summary for Same Day PCI   Patient ID: Tara Campbell MRN: [id number]; DOB: 1946/02/02  Admit date: 08/31/2022 Discharge date: 08/31/2022  Primary Care Provider: Hali Marry, MD  Primary Cardiologist: Kirk Ruths, MD  Primary Electrophysiologist:  None   Discharge Diagnoses    Active Problems:   * No active hospital problems. *    Diagnostic Studies/Procedures    Cardiac Catheterization 08/31/2022: 1.  Severe single-vessel coronary artery disease with calcified complex diffuse stenosis of the proximal, mid, and distal LAD. 2.  Patency of the left main and left circumflex 3.  Previous stenting of the RCA with continued patency of the RCA stent, moderate nonobstructive proximal and distal RCA stenoses of 50% not significantly changed from the previous cath study 4.  Successful PCI of the LAD with stenting of the proximal vessel using a 2.5 x 22 mm Onyx frontier DES and angioplasty of the mid LAD using a 2.0 mm balloon   Recommendations: Same-day PCI protocol if criteria met, aspirin 81 mg daily x30 days, resume apixaban tomorrow, continue clopidogrel x6 months.  If patient tolerates a combination of apixaban and antiplatelet therapy, would favor keeping her on a low-dose of aspirin long-term due to her extensive CAD. Diagnostic Dominance: Right  Intervention     History of Present Illness     Tara Campbell is a 76 y.o. female with history of CAD s/p RCA, paroxysmal anticoagulation, asthma, hypertension, hyperlipidemia and questionable TIA who is presented for outpatient cardiac catheterization.  Cardiac catheterization in [place] in 2017 showed severe stenosis in the right coronary and patient had PCI at that time. Patient apparently was admitted to [facility] in February 2018 with asthma exacerbation/flu and atrial fibrillation associated with these conditions and Sudafed.    Admitted 04/2021 with NSTEMI. Had cath that showed severe complex stenosis  of her LAD in the proximal mid and distal portion of the vessel.  Patent left circumflex with no significant stenosis, patent left main with no significant stenosis, patent RCA with RCA stents mild nonobstructive disease less than 50% stenosis and normal LVEDP.  Her LAD stenosis was unfavorable for PCI due to heavy calcification and marked tortuosity. There is also noted to be a small caliber vessel with likely poor targets for CABG.  Medical therapy was recommended.  Plavix stopped in 9/22 due to nose bleeds.    She was admitted in 1/23 with ?TIA => neurology evaluated, imaging showed no CVA.  Echo in 1/23 showed EF 60-65%, normal RV, IVC normal  Patient has chronic stable angina and treated with isosorbide.  Due to ongoing symptoms patient underwent cardiac PET stress test showing features consistent with anterior and anterior septal ischemia.  This was high risk study.  Seen by Dr. Irish Lack as a DOD visit 9/25 for increased shortness of breath and chest tightness.  Responsive to sublingual nitroglycerin. Cardiac catheterization was arranged for further evaluation.  She was having issues with using Repatha injection.  Hospital Course     The patient underwent cardiac cath as noted above with patent RCA stent.  Severe single-vessel coronary artery disease with calcified complex diffuse stenosis of the proximal, mid, and distal LAD. S/p successful PCI of the LAD with stenting of the proximal vessel using a 2.5 x 22 mm Onyx frontier DES and angioplasty of the mid LAD using a 2.0 mm balloon  Plan for aspirin 81 mg daily x30 days, resume apixaban tomorrow, continue clopidogrel x6 months.  If patient tolerates a combination of  apixaban and antiplatelet therapy, would favor keeping her on a low-dose of aspirin long-term due to her extensive CAD.  Chest discomfort post PCI requiring nitro x 3. Had some shortness of breath felt like prior angina prior while walking to bathroom. She will be ambulate prior to  discharge. Reviewed with Dr. Burt Knack.   The patient was seen by cardiac rehab while in short stay. There were no observed complications post cath. Radial cath site was re-evaluated prior to discharge and found to be stable without any complications. Instructions/precautions regarding cath site care were given prior to discharge.  [name] was seen by Dr. Burt Knack and determined stable for discharge home. Follow up with our office has been arranged. Medications are listed below. Pertinent changes include adding ASA.  Cath/PCI Registry Performance & Quality Measures: Aspirin prescribed? - Yes ADP Receptor Inhibitor (Plavix/Clopidogrel, Brilinta/Ticagrelor or Effient/Prasugrel) prescribed (includes medically managed patients)? - Yes High Intensity Statin (Lipitor 40-53m or Crestor 20-49m prescribed? - Yes For EF <40%, was ACEI/ARB prescribed? - Yes For EF <40%, Aldosterone Antagonist (Spironolactone or Eplerenone) prescribed? - Not Applicable (EF >/= 4081%Cardiac Rehab Phase II ordered (Included Medically managed Patients)? - Yes   Discharge Vitals Blood pressure (!) 145/81, pulse 77, temperature (!) 97.3 F (36.3 C), temperature source Skin, resp. rate 16, height _0  (1.575 m), weight 83.5 kg, SpO2 97 %.  Filed Weights   08/31/22 0805  Weight: 83.5 kg    Last Labs & Radiologic Studies    CARDIAC CATHETERIZATION  Result Date: 08/31/2022 1.  Severe single-vessel coronary artery disease with calcified complex diffuse stenosis of the proximal, mid, and distal LAD. 2.  Patency of the left main and left circumflex 3.  Previous stenting of the RCA with continued patency of the RCA stent, moderate nonobstructive proximal and distal RCA stenoses of 50% not significantly changed from the previous cath study 4.  Successful PCI of the LAD with stenting of the proximal vessel using a 2.5 x 22 mm Onyx frontier DES and angioplasty of the mid LAD using a 2.0 mm balloon Recommendations: Same-day  PCI protocol if criteria met, aspirin 81 mg daily x30 days, resume apixaban tomorrow, continue clopidogrel x6 months.  If patient tolerates a combination of apixaban and antiplatelet therapy, would favor keeping her on a low-dose of aspirin long-term due to her extensive CAD.   DG Knee Complete 4 Views Right  Result Date: 08/17/2022 CLINICAL DATA:  Osteoarthritis. Progressive pain and swelling in the right knee. EXAM: RIGHT KNEE - COMPLETE 4+ VIEW; LEFT KNEE - 1-2 VIEW COMPARISON:  None Available. FINDINGS: Asymmetric degenerative changes are present medial compartment of the right knee. 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 A well corticated fragment is noted from the medial intercondylar tubercle. No significant effusion is present. No acute or healing fracture is present. IMPRESSION: 1. Asymmetric degenerative changes in the medial compartment of the right knee. 2. Well corticated fragment from the medial intercondylar tubercle of the right knee. This may represent remote trauma to the ACL. 3. Chondrocalcinosis raises the possibility of CPPD arthritis. Electronically Signed   By: ChSan Morelle.D.   On: 08/17/2022 21:18   DG Knee 1-2 Views Left  Result Date: 08/17/2022 CLINICAL DATA:  Osteoarthritis. Progressive pain and swelling in the right knee. EXAM: RIGHT KNEE - COMPLETE 4+ VIEW; LEFT KNEE - 1-2 VIEW COMPARISON:  None Available. FINDINGS: Asymmetric degenerative changes are present medial compartment of the right knee. 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 A well corticated fragment is noted from  the medial intercondylar tubercle. No significant effusion is present. No acute or healing fracture is present. IMPRESSION: 1. Asymmetric degenerative changes in the medial compartment of the right knee. 2. Well corticated fragment from the medial intercondylar tubercle of the right knee. This may represent remote trauma to the ACL. 3. Chondrocalcinosis raises the possibility of CPPD  arthritis. Electronically Signed   By: San Morelle M.D.   On: 08/17/2022 21:18   [place] LIMITED JOINT SPACE STRUCTURES LOW RIGHT  Result Date: 08/16/2022 Procedure: Real-time Ultrasound Guided injection of right knee Device: Samsung [id number] Verbal informed consent obtained. Time-out conducted. Noted no overlying erythema, induration, or other signs of local infection. Skin prepped in a sterile fashion. Local anesthesia: Topical Ethyl chloride. With sterile technique and under real time ultrasound guidance: Noted effusion 1 cc Kenalog 40, 2 cc lidocaine, 2 cc bupivacaine injected easily Completed without difficulty Advised to call if fevers/chills, erythema, induration, drainage, or persistent bleeding. Images permanently stored and available for review in PACS. Impression: Technically successful ultrasound guided injection.   NM PET CT CARDIAC PERFUSION MULTI W/ABSOLUTE BLOODFLOW  Result Date: 08/14/2022   This study is high risk due to anterior and anteroseptal ischemia, the presence of TID, a drop in EF with stress and abnormal myocardial blood flow reserve that is most notable in the LAD territory which matches perfusion defect. Recommend cath for further evaluation.   LV perfusion is abnormal. There is evidence of ischemia. Defect 1: There is a medium defect with severe reduction in uptake present in the apical to basal anteroseptal and apex location(s) that is reversible. There is abnormal wall motion in the defect area. Consistent with ischemia. Defect 2: There is a medium defect with moderate reduction in uptake present in the apical to basal anterior location(s) that is reversible. Defect 3: There is a small defect with moderate reduction in uptake present in the apical lateral location(s) that is reversible. There is abnormal wall motion in the defect area. Consistent with ischemia.   Rest left ventricular function is normal. Rest EF: 63 %. EF dropped to 59% with stress. End diastolic cavity size  is normal. End systolic cavity size is normal.   Myocardial blood flow was computed to be 1.1m/g/min at rest and 1.665mg/min at stress. Global myocardial blood flow reserve was 1.33 and was abnormal. This was most notable in the LAD territory consistent with perfusion abnormalities. Recommend cath as above.   Coronary calcium was present on the attenuation correction CT images. Severe coronary calcifications were present. Coronary calcifications were present in the left anterior descending artery, left circumflex artery and right coronary artery distribution(s).   Findings are consistent with ischemia. The study is high risk. EXAM: OVER-READ INTERPRETATION  PET-CT CHEST The following report is an over-read performed by radiologist Dr. [facility] on 08/14/2022. This over-read does not include interpretation of cardiac or coronary anatomy or pathology. The cardiac PET interpretation by the cardiologist is to be attached. COMPARISON:  Chest CT 04/21/2019 FINDINGS: The visualized portions of the lower lung fields show no suspicious nodules, masses, or infiltrates. No pleural fluid seen. The visualized portions of the mediastinum and chest wall are unremarkable. IMPRESSION: No acute or clinically significant extracardiac findings. Electronically Signed   By: ErMisty Stanley.D.   On: 08/14/2022 11:07   Disposition   Pt is being discharged home today in good condition.  Follow-up Plans & Appointments     Discharge Instructions     Amb Referral to Cardiac Rehabilitation  Complete by: As directed    Diagnosis: Coronary Stents   After initial evaluation and assessments completed: Virtual Based Care may be provided alone or in conjunction with Phase 2 Cardiac Rehab based on patient barriers.: Yes   Intensive Cardiac Rehabilitation (ICR) [place] location only OR Traditional Cardiac Rehabilitation (TCR) *If criteria for ICR are not met will enroll in TCR [facility] only): Yes         Discharge Medications   Allergies as of 08/31/2022       Reactions   Latex Hives   Sulfa Antibiotics Rash        Medication List     TAKE these medications    acetaminophen 650 MG CR tablet Commonly known as: TYLENOL Take 1 tablet (650 mg total) by mouth every 8 (eight) hours as needed for pain.   albuterol 108 (90 Base) MCG/ACT inhaler Commonly known as: VENTOLIN HFA Inhale 2 puffs into the lungs every 6 (six) hours as needed for wheezing.   allopurinol 300 MG tablet Commonly known as: ZYLOPRIM Take 1 tablet (300 mg total) by mouth 2 (two) times daily.   apixaban 5 MG Tabs tablet Commonly known as: ELIQUIS Take 1 tablet (5 mg total) by mouth 2 (two) times daily.   aspirin EC 81 MG tablet Take 1 tablet (81 mg total) by mouth daily. Swallow whole.   atenolol 100 MG tablet Commonly known as: TENORMIN Take 1 tablet (100 mg total) by mouth daily.   atorvastatin 80 MG tablet Commonly known as: LIPITOR Take 1 tablet (80 mg total) by mouth at bedtime.   budesonide-formoterol 80-4.5 MCG/ACT inhaler Commonly known as: Symbicort Inhale 2 puffs into the lungs in the morning and at bedtime.   clopidogrel 75 MG tablet Commonly known as: PLAVIX Take 1 tablet (75 mg total) by mouth daily.   cyanocobalamin 1000 MCG tablet Commonly known as: VITAMIN B12 Take 1,000 mcg by mouth daily.   empagliflozin 10 MG Tabs tablet Commonly known as: Jardiance Take 1 tablet (10 mg total) by mouth daily before breakfast.   EPINEPHrine 0.3 mg/0.3 mL Soaj injection Commonly known as: EPI-PEN Inject 0.3 mg into the muscle as needed for anaphylaxis. Call 9-1-1 after use.   ezetimibe 10 MG tablet Commonly known as: ZETIA Take 1 tablet (10 mg total) by mouth daily.   Fish Oil 1000 MG Caps Take 1 capsule (1,000 mg total) by mouth daily.   fluticasone 50 MCG/ACT nasal spray Commonly known as: FLONASE Place 1 spray into both nostrils daily.   glucose blood test strip To be  used twice daily for testing blood sugars. E11.65   ipratropium-albuterol 0.5-2.5 (3) MG/3ML Soln Commonly known as: DUONEB Take 3 mLs by nebulization every 6 (six) hours as needed.   irbesartan-hydrochlorothiazide 300-12.5 MG tablet Commonly known as: AVALIDE Take 0.5 tablets by mouth daily.   isosorbide mononitrate 60 MG 24 hr tablet Commonly known as: IMDUR Take 1.5 tablets (90 mg total) by mouth daily.   levothyroxine 125 MCG tablet Commonly known as: SYNTHROID Take 1 tablet (125 mcg total) by mouth daily before breakfast.   metFORMIN 500 MG tablet Commonly known as: GLUCOPHAGE Take 2 tablets (1,000 mg total) by mouth 2 (two) times daily with a meal.   nitroGLYCERIN 0.4 MG SL tablet Commonly known as: Nitrostat Place 1 tablet (0.4 mg total) under the tongue every 5 (five) minutes as needed for chest pain (or tightness).   [name] 100 MG/ML Soaj Generic drug: Mepolizumab Inject 1 mL (100 mg total) into  the skin every 28 (twenty-eight) days.   REFRESH OP Place 1 drop into both eyes daily as needed (dry eyes).   Repatha Pushtronex System 420 MG/3.5ML Soct Generic drug: Evolocumab with Infusor Inject 420 mg into the skin every 30 (thirty) days.   Semaglutide (2 MG/DOSE) 8 MG/3ML Sopn Inject 2 mg as directed once a week.   Vitamin D (Cholecalciferol) 25 MCG (1000 UT) Tabs Take 25 mcg by mouth daily in the afternoon.   zinc gluconate 50 MG tablet Take 50 mg by mouth daily.           Allergies Allergies  Allergen Reactions   Latex Hives   Sulfa Antibiotics Rash    Outstanding Labs/Studies   None  Duration of Discharge Encounter   Greater than 30 minutes including physician time.  Jarrett Soho, PA 08/31/2022, 3:27 PM

## 2022-08-31 NOTE — Interval H&P Note (Signed)
Cath Lab Visit (complete for each Cath Lab visit)  Clinical Evaluation Leading to the Procedure:   ACS: No.  Non-ACS:    Anginal Classification: CCS III  Anti-ischemic medical therapy: Maximal Therapy (2 or more classes of medications)  Non-Invasive Test Results: No non-invasive testing performed  Prior CABG: No previous CABG      History and Physical Interval Note:  08/31/2022 9:20 AM  Tara Campbell  has presented today for surgery, with the diagnosis of progressive angina.  The various methods of treatment have been discussed with the patient and family. After consideration of risks, benefits and other options for treatment, the patient has consented to  Procedure(s): LEFT HEART CATH AND CORONARY ANGIOGRAPHY (N/A) as a surgical intervention.  The patient's history has been reviewed, patient examined, no change in status, stable for surgery.  I have reviewed the patient's chart and labs.  Questions were answered to the patient's satisfaction.     Sherren Mocha

## 2022-08-31 NOTE — Progress Notes (Signed)
Patient complained of chest pressure at 11:15, MD notified. Orders given to give 1 sublingual Nitroglycerin. Orders placed and followed. Patient states she is already starting to feel better after administering Nitro within 5 minutes. Will continue to monitor.

## 2022-08-31 NOTE — Progress Notes (Signed)
CARDIAC REHAB PHASE I   Pt in recliner, sister in room. Pt educated on risk factors, exercise guidelines, angina/NTG, stent, restrictions, nutrition, and orientation to CRP2 referral to [facility]. All questions from pt and family answered, pt eager to do CRP2. Pt left in recliner with all needs met. [place], [place] 08/31/2022 12:14 PM

## 2022-09-03 ENCOUNTER — Other Ambulatory Visit: Payer: Self-pay | Admitting: Family Medicine

## 2022-09-03 ENCOUNTER — Encounter (HOSPITAL_COMMUNITY): Payer: Self-pay | Admitting: Cardiovascular Disease

## 2022-09-03 DIAGNOSIS — E039 Hypothyroidism, unspecified: Secondary | ICD-10-CM

## 2022-09-06 NOTE — Progress Notes (Signed)
Office Visit    Patient Name: Tara Campbell Date of Encounter: 09/07/2022  Primary Care Provider:  Hali Marry, MD Primary Cardiologist:  None Primary Electrophysiologist: None  Chief Complaint    Tara Campbell is a 76 y.o. female with PMH of CAD s/p PCI 2017 in [place] to RCA, NSTEMI 2022 with severe calcified LAD, PAF (on Eliquis), HTN, HLD, DM 2, asthma, CHF, TIA who presents today for follow-up of recent PCI.  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   Otomycosis of right ear 09/27/2017   Paroxysmal atrial fibrillation ([place])    Thyroid disease    Past Surgical History:  Procedure Laterality Date   ANGIOPLASTY     CARDIAC CATHETERIZATION     COLONOSCOPY W/ POLYPECTOMY  01/04/2015   CORONARY STENT INTERVENTION N/A 08/31/2022   Procedure: CORONARY STENT INTERVENTION;  Surgeon: Sherren Mocha, MD;  Location: [facility];  Service: Cardiovascular;  Laterality: N/A;   HEMORRHOID BANDING     LEFT HEART CATH AND CORONARY ANGIOGRAPHY N/A 04/26/2021   Procedure: LEFT HEART CATH AND CORONARY ANGIOGRAPHY;  Surgeon: Sherren Mocha, MD;  Location: [facility];  Service: Cardiovascular;  Laterality: N/A;    Allergies  Allergies  Allergen Reactions   Sulfamethoxazole Rash    HIVES   Sulfa Antibiotics Rash    History of Present Illness    Tara Campbell  is a 76 year old female with the above mention past medical history who presents today for follow-up of recent LHC with PCI.  She was initially seen by Dr. Stanford Breed in 2019 for atrial fibrillation.  She was currently on Eliquis for CHA2DS2-VASc of 5 and rate control with  atenolol.  She was seen in 2022 for NSTEMI following complaint of chest pain.  LHC was performed and revealed severe complex stenosis of the LAD with no significant stenosis and left main and patent RCA stent.  She was treated with medical therapy due to diffuse disease and stenosis.  Also no targets for possible CABG.  Patient had Plavix stopped 9/22 due to nosebleeds.  She was admitted 12/2021 with possible TIA with CT scan completed showing no CVA.  2D echo was completed showing EF of 60-65% with normal RV and no RWMA.  She was seen on 05/2022 with plaint of chest tightness with ambulation.  Her Imdur was increased and patient was sent for cardiac PET scan.  Her PET scan was completed 08/14/2022 and was considered high risk with anterior anterior septal ischemia suggestive of LAD significant stenosis.  Patient's cardiac catheterization films were reviewed with Dr. Burt Knack who determined that due to unfavorable treatment options the decision was made to repeat LHC and treat proximal and mid LAD.  Coronary stent intervention was completed 08/31/2022 and patient was started on Plavix for 6 months in addition to her chronic Eliquis.  Catheterization notes also state if patient is tolerable to Plavix and Eliquis would favor keeping her on low-dose aspirin long-term due to extensive CAD.  Patient did experience chest discomfort following  her PCI that occurred with walking to bathroom and was relieved with nitroglycerin x3.  Tara Campbell presents today for post PCI follow-up with her sister.  Since last being seen in the office patient reports she has been doing well with no active chest pain.  She does note an anxiety attack that occurred that caused some chest discomfort that was relieved with 1 nitro.  She states that she will be reaching out to her PCP regarding medications for anxiety to help prevent further panic attacks.  She is otherwise free from any cardiac complaints today.  Her blood pressure today was 136/64  with rate of 99 bpm.  She reports that she was feeling anxious today during our visit.  We reviewed her catheterization findings and all questions were answered to patient's satisfaction.  We also discussed participating in cardiac rehab and patient is interested in proceeding with rehab at this time.  Patient denies chest pain, palpitations, dyspnea, PND, orthopnea, nausea, vomiting, dizziness, syncope, edema, weight gain, or early satiety.  Home Medications    Current Outpatient Medications  Medication Sig Dispense Refill   acetaminophen (TYLENOL) 650 MG CR tablet Take 1 tablet (650 mg total) by mouth every 8 (eight) hours as needed for pain. 90 tablet 3   albuterol (VENTOLIN HFA) 108 (90 Base) MCG/ACT inhaler Inhale 2 puffs into the lungs every 6 (six) hours as needed for wheezing. 18 g prn   allopurinol (ZYLOPRIM) 300 MG tablet Take 1 tablet (300 mg total) by mouth 2 (two) times daily. 180 tablet 1   apixaban (ELIQUIS) 5 MG TABS tablet Take 1 tablet (5 mg total) by mouth 2 (two) times daily. 180 tablet 1   aspirin EC 81 MG tablet Take 1 tablet (81 mg total) by mouth daily. Swallow whole. 30 tablet 11   atenolol (TENORMIN) 100 MG tablet Take 1 tablet (100 mg total) by mouth daily. 90 tablet 1   atorvastatin (LIPITOR) 80 MG tablet Take 1 tablet (80 mg total) by mouth at bedtime. 90 tablet 3   budesonide-formoterol (SYMBICORT) 80-4.5 MCG/ACT inhaler Inhale 2 puffs into the lungs in the morning and at bedtime. 10.2 g 5   clopidogrel (PLAVIX) 75 MG tablet Take 1 tablet (75 mg total) by mouth daily. 90 tablet 3   cyanocobalamin (VITAMIN B12) 1000 MCG tablet Take 1,000 mcg by mouth daily.     empagliflozin (JARDIANCE) 10 MG TABS tablet Take 1 tablet (10 mg total) by mouth daily before breakfast. 90 tablet 1   EPINEPHrine 0.3 mg/0.3 mL IJ SOAJ injection Inject 0.3 mg into the muscle as needed for anaphylaxis. Call 9-1-1 after use. 1 each 2   Evolocumab with Infusor ([place]) 420  MG/3.5ML SOCT Inject 420 mg into the skin every 30 (thirty) days. 3.6 mL 11   ezetimibe (ZETIA) 10 MG tablet Take 1 tablet (10 mg total) by mouth daily. 90 tablet 3   fluticasone (FLONASE) 50 MCG/ACT nasal spray Place 1 spray into both nostrils daily. 16 g 3   glucose blood test strip To be used twice daily for testing blood sugars. E11.65 200 each 11   ipratropium-albuterol (DUONEB) 0.5-2.5 (3) MG/3ML SOLN Take 3 mLs by nebulization every 6 (six) hours as needed. 3 mL PRN   irbesartan-hydrochlorothiazide (AVALIDE) 300-12.5 MG tablet Take 0.5 tablets by mouth daily. 45 tablet 1   isosorbide mononitrate (IMDUR) 60 MG 24 hr tablet Take 1.5 tablets (90 mg total) by mouth daily. 180 tablet 2   levothyroxine (SYNTHROID) 125 MCG  tablet Take 1 tablet (125 mcg total) by mouth daily before breakfast. 90 tablet 0   Mepolizumab ([name]) 100 MG/ML SOAJ Inject 1 mL (100 mg total) into the skin every 28 (twenty-eight) days. 1 mL 4   metFORMIN (GLUCOPHAGE) 500 MG tablet Take 2 tablets (1,000 mg total) by mouth 2 (two) times daily with a meal. 180 tablet 3   nitroGLYCERIN (NITROSTAT) 0.4 MG SL tablet Place 1 tablet (0.4 mg total) under the tongue every 5 (five) minutes as needed for chest pain (or tightness). 25 tablet prn   Omega-3 Fatty Acids (FISH OIL) 1000 MG CAPS Take 1 capsule (1,000 mg total) by mouth daily. 90 capsule 3   Polyvinyl Alcohol-Povidone (REFRESH OP) Place 1 drop into both eyes daily as needed (dry eyes).     Semaglutide, 2 MG/DOSE, 8 MG/3ML SOPN Inject 2 mg as directed once a week. 9 mL 1   Vitamin D, Cholecalciferol, 25 MCG (1000 UT) TABS Take 25 mcg by mouth daily in the afternoon. 60 tablet    zinc gluconate 50 MG tablet Take 50 mg by mouth daily.     No current facility-administered medications for this visit.     Review of Systems  Please see the history of present illness.    (+) Increased anxiety (+) Stable angina  All other systems reviewed and are otherwise negative except as  noted above.  Physical Exam    Wt Readings from Last 3 Encounters:  09/07/22 183 lb (83 kg)  08/31/22 184 lb (83.5 kg)  08/27/22 184 lb (83.5 kg)   VS: Vitals:   09/07/22 1059  BP: 136/64  Pulse: 100  SpO2: 94%  ,Body mass index is 31.41 kg/m.  Constitutional:      Appearance: Healthy appearance. Not in distress.  Neck:     Vascular: JVD normal.  Pulmonary:     Effort: Pulmonary effort is normal.     Breath sounds: No wheezing. No rales. Diminished in the bases Cardiovascular:     Normal rate. Regular rhythm. Normal S1. Normal S2.      Murmurs: There is no murmur.  Edema:    Peripheral edema absent.  Abdominal:     Palpations: Abdomen is soft non tender. There is no hepatomegaly.  Skin:    General: Skin is warm and dry.  Neurological:     General: No focal deficit present.     Mental Status: Alert and oriented to person, place and time.     Cranial Nerves: Cranial nerves are intact.  EKG/LABS/Other Studies Reviewed    ECG personally reviewed by me today -none completed today  Risk Assessment/Calculations:    CHA2DS2-VASc Score = 7   This indicates a 11.2% annual risk of stroke. The patient's score is based upon: CHF History: 1 HTN History: 1 Diabetes History: 1 Stroke History: 0 Vascular Disease History: 1 Age Score: 2 Gender Score: 1       Lab Results  Component Value Date   WBC 9.0 08/27/2022   HGB 13.5 08/27/2022   HCT 40.0 08/27/2022   MCV 89 08/27/2022   PLT 290 08/27/2022   Lab Results  Component Value Date   CREATININE 1.04 (H) 08/27/2022   BUN 12 08/27/2022   NA 141 08/27/2022   K 4.3 08/27/2022   CL 103 08/27/2022   CO2 27 08/27/2022   Lab Results  Component Value Date   ALT 12 09/04/2021   AST 11 09/04/2021   ALKPHOS 100 05/29/2017   BILITOT 0.4 09/04/2021  Lab Results  Component Value Date   CHOL 270 (H) 05/10/2022   HDL 65 05/10/2022   LDLCALC 182 (H) 05/10/2022   TRIG 114 05/10/2022   CHOLHDL 4.2 05/10/2022    Lab  Results  Component Value Date   HGBA1C 6.4 (A) 05/31/2022    Assessment & Plan    1.  Coronary artery disease: -Nuclear scan completed 08/14/22 revealing high risk with anterior anterior septal ischemia suggestive of LAD significant stenosis.  -s/p PCI on 08/31/2022 to complex LAD lesion treated with DES x1 -Patient currently on Plavix and Eliquis due to complex CAD -Patient had recurrence of chest pain prior to discharge that was relieved with nitroglycerin x3 -Today patient reports no active chest pain but did have 1 isolated event of chest discomfort that was relieved with nitroglycerin x1 -Continue GDMT with ASA 81 mg, Lipitor 80 mg, Plavix 75 mg, Zetia 10 mg Imdur 90 mg daily  2.  Paroxysmal atrial fibrillation: -Patient currently rate controlled with rate of 98 bpm.  Patient states increased rate is due to anxiety.  Pulse palpated and auscultation with sinus rhythm noted -Continue Eliquis 5 mg twice daily  3.  Hyperlipidemia: -Patient's last LDL cholesterol was -Continue Zetia 10 mg daily, Lipitor 80 mg daily  4.  Hypertension: -Patient's blood pressure today was well controlled at 136/64 -Continue Avalide 300-12.5mg  daily    Cardiac Rehabilitation Eligibility Assessment       Disposition: Follow-up with None or APP in 1 months    Medication Adjustments/Labs and Tests Ordered: Current medicines are reviewed at length with the patient today.  Concerns regarding medicines are outlined above.   Signed, Mable Fill, Marissa Nestle, NP 09/07/2022, 12:34 PM [facility]  Note:  This document was prepared using Dragon voice recognition software and may include unintentional dictation errors.

## 2022-09-07 ENCOUNTER — Encounter: Payer: Self-pay | Admitting: Nurse Practitioner

## 2022-09-07 ENCOUNTER — Ambulatory Visit: Payer: PPO | Attending: Nurse Practitioner | Admitting: Nurse Practitioner

## 2022-09-07 VITALS — BP 136/64 | HR 100 | Ht 64.0 in | Wt 183.0 lb

## 2022-09-07 DIAGNOSIS — I10 Essential (primary) hypertension: Secondary | ICD-10-CM

## 2022-09-07 DIAGNOSIS — I480 Paroxysmal atrial fibrillation: Secondary | ICD-10-CM | POA: Diagnosis not present

## 2022-09-07 DIAGNOSIS — E782 Mixed hyperlipidemia: Secondary | ICD-10-CM | POA: Diagnosis not present

## 2022-09-07 DIAGNOSIS — I25119 Atherosclerotic heart disease of native coronary artery with unspecified angina pectoris: Secondary | ICD-10-CM

## 2022-09-07 DIAGNOSIS — Z955 Presence of coronary angioplasty implant and graft: Secondary | ICD-10-CM

## 2022-09-07 NOTE — Patient Instructions (Addendum)
Medication Instructions:  Your physician has recommended you make the following change in your medication: Stop aspirin on September 30, 2022  *If you need a refill on your cardiac medications before your next appointment, please call your pharmacy*   Lab Work: none If you have labs (blood work) drawn today and your tests are completely normal, you will receive your results only by: [place] (if you have MyChart) OR A paper copy in the mail If you have any lab test that is abnormal or we need to change your treatment, we will call you to review the results.   Testing/Procedures: none   Follow-Up: At [facility], you and your health needs are our priority.  As part of our continuing mission to provide you with exceptional heart care, we have created designated Provider Care Teams.  These Care Teams include your primary Cardiologist (physician) and Advanced Practice Providers (APPs -  Physician Assistants and Nurse Practitioners) who all work together to provide you with the care you need, when you need it.  We recommend signing up for the patient portal called "MyChart".  Sign up information is provided on this After Visit Summary.  MyChart is used to connect with patients for Virtual Visits (Telemedicine).  Patients are able to view lab/test results, encounter notes, upcoming appointments, etc.  Non-urgent messages can be sent to your provider as well.   To learn more about what you can do with MyChart, go to [email or web address].    Your next appointment:   1 month with APP--6 months with Dr Aundra Dubin  The format for your next appointment:   In Person  Provider:   APP, Dr Aundra Dubin  Other Instructions    Important Information About Sugar

## 2022-09-08 ENCOUNTER — Encounter (HOSPITAL_BASED_OUTPATIENT_CLINIC_OR_DEPARTMENT_OTHER): Payer: Self-pay | Admitting: Emergency Medicine

## 2022-09-08 ENCOUNTER — Emergency Department (HOSPITAL_BASED_OUTPATIENT_CLINIC_OR_DEPARTMENT_OTHER)
Admission: EM | Admit: 2022-09-08 | Discharge: 2022-09-09 | Disposition: A | Discharge status: Home / Self Care | Payer: PPO | Attending: Emergency Medicine | Admitting: Emergency Medicine

## 2022-09-08 ENCOUNTER — Emergency Department (HOSPITAL_BASED_OUTPATIENT_CLINIC_OR_DEPARTMENT_OTHER): Payer: PPO

## 2022-09-08 ENCOUNTER — Other Ambulatory Visit: Payer: Self-pay

## 2022-09-08 DIAGNOSIS — Z7982 Long term (current) use of aspirin: Secondary | ICD-10-CM | POA: Diagnosis not present

## 2022-09-08 DIAGNOSIS — R000 Tachycardia, unspecified: Secondary | ICD-10-CM | POA: Diagnosis not present

## 2022-09-08 DIAGNOSIS — R079 Chest pain, unspecified: Secondary | ICD-10-CM | POA: Diagnosis not present

## 2022-09-08 DIAGNOSIS — D72829 Elevated white blood cell count, unspecified: Secondary | ICD-10-CM | POA: Diagnosis not present

## 2022-09-08 DIAGNOSIS — Z7901 Long term (current) use of anticoagulants: Secondary | ICD-10-CM | POA: Diagnosis not present

## 2022-09-08 DIAGNOSIS — R072 Precordial pain: Secondary | ICD-10-CM | POA: Insufficient documentation

## 2022-09-08 DIAGNOSIS — Z7902 Long term (current) use of antithrombotics/antiplatelets: Secondary | ICD-10-CM | POA: Diagnosis not present

## 2022-09-08 DIAGNOSIS — R1013 Epigastric pain: Secondary | ICD-10-CM | POA: Diagnosis not present

## 2022-09-08 LAB — BASIC METABOLIC PANEL
Anion gap: 9 (ref 5–15)
BUN: 12 mg/dL (ref 8–23)
CO2: 22 mmol/L (ref 22–32)
Calcium: 9.5 mg/dL (ref 8.9–10.3)
Chloride: 107 mmol/L (ref 98–111)
Creatinine, Ser: 1 mg/dL (ref 0.44–1.00)
GFR, Estimated: 58 mL/min — ABNORMAL LOW (ref >60)
Glucose, Bld: 130 mg/dL — ABNORMAL HIGH (ref 70–99)
Potassium: 3.8 mmol/L (ref 3.5–5.1)
Sodium: 138 mmol/L (ref 135–145)

## 2022-09-08 LAB — CBC
HCT: 35.6 % — ABNORMAL LOW (ref 36.0–46.0)
Hemoglobin: 12 g/dL (ref 12.0–15.0)
MCH: 29.3 pg (ref 26.0–34.0)
MCHC: 33.7 g/dL (ref 30.0–36.0)
MCV: 86.8 fL (ref 80.0–100.0)
Platelets: 350 10*3/uL (ref 150–400)
RBC: 4.1 MIL/uL (ref 3.87–5.11)
RDW: 13.7 % (ref 11.5–15.5)
WBC: 11.2 10*3/uL — ABNORMAL HIGH (ref 4.0–10.5)
nRBC: 0 % (ref 0.0–0.2)

## 2022-09-08 LAB — TROPONIN I (HIGH SENSITIVITY): Troponin I (High Sensitivity): 12 ng/L (ref <18)

## 2022-09-08 NOTE — ED Triage Notes (Signed)
Patient arrived via POV c/o left chest pain x 6 hr pta. Patient just had stent placed 1 week prior. Patient states fluttering pain under left breast w/ no radiation. Patient states 6/10 pain. Patient is AO x 4, VS w/ elevated HR, normal gait.

## 2022-09-09 LAB — TROPONIN I (HIGH SENSITIVITY): Troponin I (High Sensitivity): 12 ng/L (ref <18)

## 2022-09-09 NOTE — ED Provider Notes (Signed)
[facility] EMERGENCY DEPARTMENT Provider Note   CSN: [id number] Arrival date & time: 09/08/22  2215     History  Chief Complaint  Patient presents with   Chest Pain    Tara Campbell is a 76 y.o. female.  The history is provided by the patient and a relative.  Chest Pain Pain location:  Epigastric Pain quality comment:  Throbbing and twinges Pain radiates to:  Does not radiate Pain severity:  Moderate Onset quality:  Sudden Timing:  Sporadic Progression:  Resolved Context: not eating, not lifting and not trauma   Relieved by:  Nothing Worsened by:  Nothing Ineffective treatments:  Nitroglycerin Associated symptoms: no abdominal pain, no anorexia, no cough, no diaphoresis, no dizziness, no fever, no heartburn, no lower extremity edema, no palpitations, no vomiting and no weakness   Risk factors: not female and not pregnant   Patient with a recent cardiac catheterization presents with throbbing and twinges of of pain in the epigastric area lasting 5-10 minutes.  There was more than one episode.  NTG did not help the pain.  No nausea vomiting or diaphoresis.  No leg pain or swelling no back pain.       Home Medications Prior to Admission medications   Medication Sig Start Date End Date Taking? Authorizing Provider  acetaminophen (TYLENOL) 650 MG CR tablet Take 1 tablet (650 mg total) by mouth every 8 (eight) hours as needed for pain. 10/02/21   Silverio Decamp, MD  albuterol (VENTOLIN HFA) 108 (90 Base) MCG/ACT inhaler Inhale 2 puffs into the lungs every 6 (six) hours as needed for wheezing. 05/02/22   Parrett, Fonnie Mu, NP  allopurinol (ZYLOPRIM) 300 MG tablet Take 1 tablet (300 mg total) by mouth 2 (two) times daily. 04/26/22   Silverio Decamp, MD  apixaban (ELIQUIS) 5 MG TABS tablet Take 1 tablet (5 mg total) by mouth 2 (two) times daily. 04/26/22   Silverio Decamp, MD  aspirin EC 81 MG tablet Take 1 tablet (81 mg total) by mouth daily. Swallow  whole. 08/31/22 08/31/23  Bhagat, Crista Luria, PA  atenolol (TENORMIN) 100 MG tablet Take 1 tablet (100 mg total) by mouth daily. 04/26/22   Silverio Decamp, MD  atorvastatin (LIPITOR) 80 MG tablet Take 1 tablet (80 mg total) by mouth at bedtime. 04/26/22   Silverio Decamp, MD  budesonide-formoterol (SYMBICORT) 80-4.5 MCG/ACT inhaler Inhale 2 puffs into the lungs in the morning and at bedtime. 05/02/22   Parrett, Fonnie Mu, NP  clopidogrel (PLAVIX) 75 MG tablet Take 1 tablet (75 mg total) by mouth daily. 08/17/22   Sherren Mocha, MD  cyanocobalamin (VITAMIN B12) 1000 MCG tablet Take 1,000 mcg by mouth daily.    [provider]  empagliflozin (JARDIANCE) 10 MG TABS tablet Take 1 tablet (10 mg total) by mouth daily before breakfast. 04/26/22   Silverio Decamp, MD  EPINEPHrine 0.3 mg/0.3 mL IJ SOAJ injection Inject 0.3 mg into the muscle as needed for anaphylaxis. Call 9-1-1 after use. 05/02/22   Parrett, Fonnie Mu, NP  Evolocumab with Infusor ([place]) 420 MG/3.5ML SOCT Inject 420 mg into the skin every 30 (thirty) days. 04/26/22   Silverio Decamp, MD  ezetimibe (ZETIA) 10 MG tablet Take 1 tablet (10 mg total) by mouth daily. 04/26/22   Silverio Decamp, MD  fluticasone (FLONASE) 50 MCG/ACT nasal spray Place 1 spray into both nostrils daily. 04/26/22   Silverio Decamp, MD  glucose blood test strip To  be used twice daily for testing blood sugars. E11.65 04/03/21   Hali Marry, MD  ipratropium-albuterol (DUONEB) 0.5-2.5 (3) MG/3ML SOLN Take 3 mLs by nebulization every 6 (six) hours as needed. 05/02/22   Parrett, Fonnie Mu, NP  irbesartan-hydrochlorothiazide (AVALIDE) 300-12.5 MG tablet Take 0.5 tablets by mouth daily. 04/26/22   Silverio Decamp, MD  isosorbide mononitrate (IMDUR) 60 MG 24 hr tablet Take 1.5 tablets (90 mg total) by mouth daily. 05/10/22   Larey Dresser, MD  levothyroxine (SYNTHROID) 125 MCG tablet Take 1 tablet (125 mcg  total) by mouth daily before breakfast. 09/03/22   Hali Marry, MD  Mepolizumab ([name]) 100 MG/ML SOAJ Inject 1 mL (100 mg total) into the skin every 28 (twenty-eight) days. 12/18/21   Mannam, Hart Robinsons, MD  metFORMIN (GLUCOPHAGE) 500 MG tablet Take 2 tablets (1,000 mg total) by mouth 2 (two) times daily with a meal. 04/26/22   Silverio Decamp, MD  nitroGLYCERIN (NITROSTAT) 0.4 MG SL tablet Place 1 tablet (0.4 mg total) under the tongue every 5 (five) minutes as needed for chest pain (or tightness). 05/09/22   Breeback, Royetta Car, PA-C  Omega-3 Fatty Acids (FISH OIL) 1000 MG CAPS Take 1 capsule (1,000 mg total) by mouth daily. 04/26/22   Silverio Decamp, MD  Polyvinyl Alcohol-Povidone (REFRESH OP) Place 1 drop into both eyes daily as needed (dry eyes).    [provider]  Semaglutide, 2 MG/DOSE, 8 MG/3ML SOPN Inject 2 mg as directed once a week. 04/26/22   Silverio Decamp, MD  Vitamin D, Cholecalciferol, 25 MCG (1000 UT) TABS Take 25 mcg by mouth daily in the afternoon. 12/27/21   [place] Donnamarie Poag, DO  zinc gluconate 50 MG tablet Take 50 mg by mouth daily.    [provider]      Allergies    Sulfamethoxazole and Sulfa antibiotics    Review of Systems   Review of Systems  Constitutional:  Negative for diaphoresis and fever.  HENT:  Negative for facial swelling.   Respiratory:  Negative for cough.   Cardiovascular:  Positive for chest pain. Negative for palpitations.  Gastrointestinal:  Negative for abdominal pain, anorexia, heartburn and vomiting.  Neurological:  Negative for dizziness and weakness.  All other systems reviewed and are negative.   Physical Exam Updated Vital Signs BP 107/61   Pulse 89   Temp 98.2 F (36.8 C) (Oral)   Resp 17   Ht _0  (1.626 m)   Wt 83 kg   SpO2 95%   BMI 31.41 kg/m  Physical Exam Vitals and nursing note reviewed.  Constitutional:      General: She is not in acute distress.    Appearance: Normal appearance.  She is well-developed. She is not diaphoretic.  HENT:     Head: Normocephalic and atraumatic.  Eyes:     Pupils: Pupils are equal, round, and reactive to light.  Cardiovascular:     Rate and Rhythm: Normal rate and regular rhythm.     Pulses: Normal pulses.     Heart sounds: Normal heart sounds.  Pulmonary:     Effort: Pulmonary effort is normal. No respiratory distress.     Breath sounds: Normal breath sounds.  Abdominal:     General: Bowel sounds are normal. There is no distension.     Palpations: Abdomen is soft.     Tenderness: There is no abdominal tenderness. There is no guarding or rebound.  Genitourinary:    Vagina: No vaginal  discharge.  Musculoskeletal:        General: No tenderness. Normal range of motion.     Cervical back: Neck supple.     Right lower leg: No edema.     Left lower leg: No edema.  Skin:    General: Skin is dry.     Capillary Refill: Capillary refill takes less than 2 seconds.     Findings: No erythema or rash.  Neurological:     General: No focal deficit present.     Mental Status: She is alert and oriented to person, place, and time.     Deep Tendon Reflexes: Reflexes normal.  Psychiatric:        Mood and Affect: Mood normal.     ED Results / Procedures / Treatments   Labs (all labs ordered are listed, but only abnormal results are displayed) Results for orders placed or performed during the hospital encounter of 42/35/36  Basic metabolic panel  Result Value Ref Range   Sodium 138 135 - 145 mmol/L   Potassium 3.8 3.5 - 5.1 mmol/L   Chloride 107 98 - 111 mmol/L   CO2 22 22 - 32 mmol/L   Glucose, Bld 130 (H) 70 - 99 mg/dL   BUN 12 8 - 23 mg/dL   Creatinine, Ser 1.00 0.44 - 1.00 mg/dL   Calcium 9.5 8.9 - 10.3 mg/dL   GFR, Estimated 58 (L) >60 mL/min   Anion gap 9 5 - 15  CBC  Result Value Ref Range   WBC 11.2 (H) 4.0 - 10.5 K/uL   RBC 4.10 3.87 - 5.11 MIL/uL   Hemoglobin 12.0 12.0 - 15.0 g/dL   HCT 35.6 (L) 36.0 - 46.0 %   MCV 86.8  80.0 - 100.0 fL   MCH 29.3 26.0 - 34.0 pg   MCHC 33.7 30.0 - 36.0 g/dL   RDW 13.7 11.5 - 15.5 %   Platelets 350 150 - 400 K/uL   nRBC 0.0 0.0 - 0.2 %  Troponin I (High Sensitivity)  Result Value Ref Range   Troponin I (High Sensitivity) 12 <18 ng/L  Troponin I (High Sensitivity)  Result Value Ref Range   Troponin I (High Sensitivity) 12 <18 ng/L   DG Chest 2 View  Result Date: 09/08/2022 CLINICAL DATA:  Left-sided chest pain. EXAM: CHEST - 2 VIEW COMPARISON:  December 26, 2021 FINDINGS: The heart size and mediastinal contours are within normal limits. Both lungs are clear. Multilevel degenerative changes are seen throughout the thoracic spine. IMPRESSION: No active cardiopulmonary disease. Electronically Signed   By: Virgina Norfolk M.D.   On: 09/08/2022 22:37   CARDIAC CATHETERIZATION  Result Date: 08/31/2022 1.  Severe single-vessel coronary artery disease with calcified complex diffuse stenosis of the proximal, mid, and distal LAD. 2.  Patency of the left main and left circumflex 3.  Previous stenting of the RCA with continued patency of the RCA stent, moderate nonobstructive proximal and distal RCA stenoses of 50% not significantly changed from the previous cath study 4.  Successful PCI of the LAD with stenting of the proximal vessel using a 2.5 x 22 mm Onyx frontier DES and angioplasty of the mid LAD using a 2.0 mm balloon Recommendations: Same-day PCI protocol if criteria met, aspirin 81 mg daily x30 days, resume apixaban tomorrow, continue clopidogrel x6 months.  If patient tolerates a combination of apixaban and antiplatelet therapy, would favor keeping her on a low-dose of aspirin long-term due to her extensive CAD.   DG Knee  Complete 4 Views Right  Result Date: 08/17/2022 CLINICAL DATA:  Osteoarthritis. Progressive pain and swelling in the right knee. EXAM: RIGHT KNEE - COMPLETE 4+ VIEW; LEFT KNEE - 1-2 VIEW COMPARISON:  None Available. FINDINGS: Asymmetric degenerative changes are  present medial compartment of the right knee. 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 A well corticated fragment is noted from the medial intercondylar tubercle. No significant effusion is present. No acute or healing fracture is present. IMPRESSION: 1. Asymmetric degenerative changes in the medial compartment of the right knee. 2. Well corticated fragment from the medial intercondylar tubercle of the right knee. This may represent remote trauma to the ACL. 3. Chondrocalcinosis raises the possibility of CPPD arthritis. Electronically Signed   By: San Morelle M.D.   On: 08/17/2022 21:18   DG Knee 1-2 Views Left  Result Date: 08/17/2022 CLINICAL DATA:  Osteoarthritis. Progressive pain and swelling in the right knee. EXAM: RIGHT KNEE - COMPLETE 4+ VIEW; LEFT KNEE - 1-2 VIEW COMPARISON:  None Available. FINDINGS: Asymmetric degenerative changes are present medial compartment of the right knee. 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 A well corticated fragment is noted from the medial intercondylar tubercle. No significant effusion is present. No acute or healing fracture is present. IMPRESSION: 1. Asymmetric degenerative changes in the medial compartment of the right knee. 2. Well corticated fragment from the medial intercondylar tubercle of the right knee. This may represent remote trauma to the ACL. 3. Chondrocalcinosis raises the possibility of CPPD arthritis. Electronically Signed   By: San Morelle M.D.   On: 08/17/2022 21:18   [place] LIMITED JOINT SPACE STRUCTURES LOW RIGHT  Result Date: 08/16/2022 Procedure: Real-time Ultrasound Guided injection of right knee Device: Samsung [id number] Verbal informed consent obtained. Time-out conducted. Noted no overlying erythema, induration, or other signs of local infection. Skin prepped in a sterile fashion. Local anesthesia: Topical Ethyl chloride. With sterile technique and under real time ultrasound guidance: Noted effusion 1 cc Kenalog 40, 2 cc  lidocaine, 2 cc bupivacaine injected easily Completed without difficulty Advised to call if fevers/chills, erythema, induration, drainage, or persistent bleeding. Images permanently stored and available for review in PACS. Impression: Technically successful ultrasound guided injection.   NM PET CT CARDIAC PERFUSION MULTI W/ABSOLUTE BLOODFLOW  Result Date: 08/14/2022   This study is high risk due to anterior and anteroseptal ischemia, the presence of TID, a drop in EF with stress and abnormal myocardial blood flow reserve that is most notable in the LAD territory which matches perfusion defect. Recommend cath for further evaluation.   LV perfusion is abnormal. There is evidence of ischemia. Defect 1: There is a medium defect with severe reduction in uptake present in the apical to basal anteroseptal and apex location(s) that is reversible. There is abnormal wall motion in the defect area. Consistent with ischemia. Defect 2: There is a medium defect with moderate reduction in uptake present in the apical to basal anterior location(s) that is reversible. Defect 3: There is a small defect with moderate reduction in uptake present in the apical lateral location(s) that is reversible. There is abnormal wall motion in the defect area. Consistent with ischemia.   Rest left ventricular function is normal. Rest EF: 63 %. EF dropped to 59% with stress. End diastolic cavity size is normal. End systolic cavity size is normal.   Myocardial blood flow was computed to be 1.8m/g/min at rest and 1.680mg/min at stress. Global myocardial blood flow reserve was 1.33 and was abnormal. This was most notable in the LAD territory consistent  with perfusion abnormalities. Recommend cath as above.   Coronary calcium was present on the attenuation correction CT images. Severe coronary calcifications were present. Coronary calcifications were present in the left anterior descending artery, left circumflex artery and right coronary artery  distribution(s).   Findings are consistent with ischemia. The study is high risk. EXAM: OVER-READ INTERPRETATION  PET-CT CHEST The following report is an over-read performed by radiologist Dr. Rosine Beat [facility] on 08/14/2022. This over-read does not include interpretation of cardiac or coronary anatomy or pathology. The cardiac PET interpretation by the cardiologist is to be attached. COMPARISON:  Chest CT 04/21/2019 FINDINGS: The visualized portions of the lower lung fields show no suspicious nodules, masses, or infiltrates. No pleural fluid seen. The visualized portions of the mediastinum and chest wall are unremarkable. IMPRESSION: No acute or clinically significant extracardiac findings. Electronically Signed   By: Misty Stanley M.D.   On: 08/14/2022 11:07   EKG EKG Interpretation  Date/Time:  Saturday September 08 2022 22:22:37 EDT Ventricular Rate:  106 PR Interval:  212 QRS Duration: 72 QT Interval:  324 QTC Calculation: 430 R Axis:   22 Text Interpretation: Sinus tachycardia with 1st degree A-V block Low voltage QRS Cannot rule out Anterior infarct , age undetermined Abnormal ECG When compared with ECG of 31-Aug-2022 10:43, PREVIOUS ECG IS PRESENT since last tracing no significant change Confirmed by Malvin Johns [phone]) on 09/08/2022 10:36:13 PM  Radiology DG Chest 2 View  Result Date: 09/08/2022 CLINICAL DATA:  Left-sided chest pain. EXAM: CHEST - 2 VIEW COMPARISON:  December 26, 2021 FINDINGS: The heart size and mediastinal contours are within normal limits. Both lungs are clear. Multilevel degenerative changes are seen throughout the thoracic spine. IMPRESSION: No active cardiopulmonary disease. Electronically Signed   By: Virgina Norfolk M.D.   On: 09/08/2022 22:37    Procedures Procedures    Medications Ordered in ED Medications - No data to display  ED Course/ Medical Decision Making/ A&P                           Medical Decision Making Patient with  cardiac catheterization one week ago with presents with throbbing twingy pain in the epigastrum   Amount and/or Complexity of Data Reviewed Independent Historian:     Details: Sister see above  External Data Reviewed: ECG and notes.    Details: Previous catheterization report and notes and EKG reviewed.  Today's EKG is unchanged from this  Labs: ordered.    Details: All labs reviewed by me:  normal sodium 138, normal potassium 3.8, normal creatinine 1.  White count slight elevation 11.2, normal 12, normal platelets 350K.  2 negative troponins 12/12.   Radiology: ordered and independent interpretation performed.    Details: Normal CXR by me  ECG/medicine tests: ordered and independent interpretation performed. Decision-making details documented in ED Course.  Risk Risk Details: Patient has ruled out for MI in the ED with unchanged EKG and 2 negative troponins.  Pain is atypical for cardiac pain.  I suspect this is GERD.  Please follow up with your PMD and cardiologist for ongoing care.  Strict return.      Final Clinical Impression(s) / ED Diagnoses Final diagnoses:  Precordial pain   Return for intractable cough, coughing up blood, fevers > 100.4 unrelieved by medication, shortness of breath, intractable vomiting, chest pain, shortness of breath, weakness, numbness, changes in speech, facial asymmetry, abdominal pain, passing out, Inability to tolerate  liquids or food, cough, altered mental status or any concerns. No signs of systemic illness or infection. The patient is nontoxic-appearing on exam and vital signs are within normal limits.  I have reviewed the triage vital signs and the nursing notes. Pertinent labs & imaging results that were available during my care of the patient were reviewed by me and considered in my medical decision making (see chart for details). After history, exam, and medical workup I feel the patient has been appropriately medically screened and is safe for  discharge home. Pertinent diagnoses were discussed with the patient. Patient was given return precautions.  Rx / DC Orders ED Discharge Orders     None         Francely Craw, MD 09/09/22 [phone]

## 2022-09-09 NOTE — ED Notes (Signed)
Pt ambulatory to bathroom, no assistance needed.  

## 2022-09-10 ENCOUNTER — Encounter: Payer: Self-pay | Admitting: Family Medicine

## 2022-09-10 ENCOUNTER — Encounter (HOSPITAL_COMMUNITY): Payer: Self-pay | Admitting: Cardiology

## 2022-09-10 ENCOUNTER — Ambulatory Visit (INDEPENDENT_AMBULATORY_CARE_PROVIDER_SITE_OTHER): Payer: PPO | Admitting: Family Medicine

## 2022-09-10 VITALS — BP 131/76 | HR 118 | Ht 64.0 in | Wt 186.0 lb

## 2022-09-10 DIAGNOSIS — E118 Type 2 diabetes mellitus with unspecified complications: Secondary | ICD-10-CM | POA: Diagnosis not present

## 2022-09-10 DIAGNOSIS — F418 Other specified anxiety disorders: Secondary | ICD-10-CM | POA: Insufficient documentation

## 2022-09-10 DIAGNOSIS — F411 Generalized anxiety disorder: Secondary | ICD-10-CM | POA: Insufficient documentation

## 2022-09-10 DIAGNOSIS — R002 Palpitations: Secondary | ICD-10-CM

## 2022-09-10 LAB — POCT GLYCOSYLATED HEMOGLOBIN (HGB A1C): Hemoglobin A1C: 6 % — AB (ref 4.0–5.6)

## 2022-09-10 MED ORDER — SEMAGLUTIDE (1 MG/DOSE) 4 MG/3ML ~~LOC~~ SOPN: 1.0000 mg | PEN_INJECTOR | SUBCUTANEOUS | 1 refills | Status: DC

## 2022-09-10 MED ORDER — SERTRALINE HCL 25 MG PO TABS: 25.0000 mg | ORAL_TABLET | Freq: Every day | ORAL | 1 refills | Status: DC

## 2022-09-10 NOTE — Progress Notes (Signed)
Established Patient Office Visit  Subjective   Patient ID: Tara Campbell, female    DOB: Nov 30, 1946  Age: 76 y.o. MRN: [id number]  Chief Complaint  Patient presents with   Follow-up   Diabetes    HPI  Follow-up ED visit.  She was just seen in the emergency department for elevated blood pressures and increased heart rate.  She denies any swelling or chest discomfort. she reports that yesterday blood pressure jumped up to 170s and stayed up for several hours she started to feel like her heart was racing.  She had her sister come to the house who is a former Marine scientist.  And she ended up going to the emergency room.  She is feeling some better today.  Diabetes - no hypoglycemic events. No wounds or sores that are not healing well. No increased thirst or urination. Checking glucose at home. Taking medications as prescribed without any side effects.  Has been out of the exam back for probably 2 to 3 months but she has been taking the metformin and Jardiance.  But she has been really good about trying to eat healthy and portion control.  Wanted to discuss her anxiety.  She says she just feels like she is a lot more on edge and that small things are just really getting to her and making her more anxious than it used to.  She says at least 3 to 4 days a week she feels a little stressed or overwhelmed or anxious.  She has never been on medication for this.      ROS    Objective:     BP 131/76   Pulse (!) 118   Ht 5\' 4"  (1.626 m)   Wt 186 lb (84.4 kg)   SpO2 97%   BMI 31.93 kg/m    Physical Exam Vitals and nursing note reviewed.  Constitutional:      Appearance: She is well-developed.  HENT:     Head: Normocephalic and atraumatic.  Cardiovascular:     Rate and Rhythm: Normal rate and regular rhythm.     Heart sounds: Normal heart sounds.  Pulmonary:     Effort: Pulmonary effort is normal.     Breath sounds: Normal breath sounds.  Skin:    General: Skin is warm and dry.   Neurological:     Mental Status: She is alert and oriented to person, place, and time.  Psychiatric:        Behavior: Behavior normal.      Results for orders placed or performed in visit on 09/10/22  POCT glycosylated hemoglobin (Hb A1C)  Result Value Ref Range   Hemoglobin A1C 6.0 (A) 4.0 - 5.6 %   HbA1c POC (<> result, manual entry)     HbA1c, POC (prediabetic range)     HbA1c, POC (controlled diabetic range)        The ASCVD Risk score (Arnett DK, et al., 2019) failed to calculate for the following reasons:   The patient has a prior MI or stroke diagnosis    Assessment & Plan:   Problem List Items Addressed This Visit       Endocrine   Controlled diabetes mellitus type 2 with complications ([place]) - Primary    A1c looks phenomenal today at 6.0.  She is really done a fantastic job and she is actually been off the Ozempic for at least 2 to 3 months.  She never experienced any nausea with the Ozempic even when she started or  on increased doses.  Spoken to try to restart it at the 1 mg dose I think she will probably tolerate it.  Worst-case we can always call in a little bit of Zofran if needed.  I really like to get her on this Ozempic and get her off of the Jardiance and maybe even the metformin if she still doing really well.      Relevant Medications   Semaglutide, 1 MG/DOSE, 4 MG/3ML SOPN   Other Relevant Orders   POCT glycosylated hemoglobin (Hb A1C) (Completed)     Other   GAD (generalized anxiety disorder)    Discussed options.  She is having symptoms at least 3 to 4 days/week.  GAD-7 score of 8 today.  We will start an SSRI.  Did warn about potential side effects follow-up in 1 month to see how she is doing to make any dose adjustments needed.  We will start with sertraline 25 mg daily.      09/10/2022    3:50 PM  GAD 7 : Generalized Anxiety Score  Nervous, Anxious, on Edge 2  Control/stop worrying 1  Worry too much - different things 1  Trouble relaxing 1   Restless 0  Easily annoyed or irritable 2  Afraid - awful might happen 1  Total GAD 7 Score 8  Anxiety Difficulty Not difficult at all          Relevant Medications   sertraline (ZOLOFT) 25 MG tablet   Other Visit Diagnoses     Palpitations           Palpitations-she is in regular rhythm today. she has reached out to Dr. Alford Highland office.  They are can to get her scheduled for a cardiac monitor and schedule a follow-up appointment.  Return in about 3 months (around 12/11/2022) for Diabetes follow-up.    Nani Gasser, MD

## 2022-09-10 NOTE — Assessment & Plan Note (Signed)
A1c looks phenomenal today at 6.0.  She is really done a fantastic job and she is actually been off the Ozempic for at least 2 to 3 months.  She never experienced any nausea with the Ozempic even when she started or on increased doses.  Spoken to try to restart it at the 1 mg dose I think she will probably tolerate it.  Worst-case we can always call in a little bit of Zofran if needed.  I really like to get her on this Ozempic and get her off of the Jardiance and maybe even the metformin if she still doing really well.

## 2022-09-10 NOTE — Assessment & Plan Note (Signed)
Discussed options.  She is having symptoms at least 3 to 4 days/week.  GAD-7 score of 8 today.  We will start an SSRI.  Did warn about potential side effects follow-up in 1 month to see how she is doing to make any dose adjustments needed.  We will start with sertraline 25 mg daily.      09/10/2022    3:50 PM  GAD 7 : Generalized Anxiety Score  Nervous, Anxious, on Edge 2  Control/stop worrying 1  Worry too much - different things 1  Trouble relaxing 1  Restless 0  Easily annoyed or irritable 2  Afraid - awful might happen 1  Total GAD 7 Score 8  Anxiety Difficulty Not difficult at all

## 2022-09-10 NOTE — Patient Instructions (Signed)
If you are able to get the Ozempic then please restart that once a week injection.  And then we can hold off on refilling the Jardiance.  If you are not able to get the medication then let me know I can always send in a short supply of Jardiance until you are able to restart.

## 2022-09-13 ENCOUNTER — Emergency Department (HOSPITAL_COMMUNITY): Payer: PPO

## 2022-09-13 ENCOUNTER — Other Ambulatory Visit: Payer: Self-pay

## 2022-09-13 ENCOUNTER — Inpatient Hospital Stay (HOSPITAL_COMMUNITY)
Admission: EM | Admit: 2022-09-13 | Discharge: 2022-09-15 | DRG: 282 | Disposition: A | Discharge status: Home / Self Care | Payer: PPO | Attending: Student | Admitting: Student

## 2022-09-13 ENCOUNTER — Encounter (HOSPITAL_COMMUNITY): Payer: Self-pay

## 2022-09-13 DIAGNOSIS — I252 Old myocardial infarction: Secondary | ICD-10-CM

## 2022-09-13 DIAGNOSIS — H409 Unspecified glaucoma: Secondary | ICD-10-CM | POA: Diagnosis present

## 2022-09-13 DIAGNOSIS — I214 Non-ST elevation (NSTEMI) myocardial infarction: Secondary | ICD-10-CM | POA: Diagnosis not present

## 2022-09-13 DIAGNOSIS — Z6831 Body mass index (BMI) 31.0-31.9, adult: Secondary | ICD-10-CM | POA: Diagnosis not present

## 2022-09-13 DIAGNOSIS — I2510 Atherosclerotic heart disease of native coronary artery without angina pectoris: Secondary | ICD-10-CM | POA: Diagnosis not present

## 2022-09-13 DIAGNOSIS — Z83438 Family history of other disorder of lipoprotein metabolism and other lipidemia: Secondary | ICD-10-CM | POA: Diagnosis not present

## 2022-09-13 DIAGNOSIS — E118 Type 2 diabetes mellitus with unspecified complications: Secondary | ICD-10-CM | POA: Diagnosis not present

## 2022-09-13 DIAGNOSIS — Z7901 Long term (current) use of anticoagulants: Secondary | ICD-10-CM | POA: Diagnosis not present

## 2022-09-13 DIAGNOSIS — I25110 Atherosclerotic heart disease of native coronary artery with unstable angina pectoris: Secondary | ICD-10-CM | POA: Diagnosis not present

## 2022-09-13 DIAGNOSIS — Z7985 Long-term (current) use of injectable non-insulin antidiabetic drugs: Secondary | ICD-10-CM

## 2022-09-13 DIAGNOSIS — I480 Paroxysmal atrial fibrillation: Secondary | ICD-10-CM | POA: Diagnosis present

## 2022-09-13 DIAGNOSIS — I10 Essential (primary) hypertension: Secondary | ICD-10-CM | POA: Diagnosis not present

## 2022-09-13 DIAGNOSIS — Z7951 Long term (current) use of inhaled steroids: Secondary | ICD-10-CM

## 2022-09-13 DIAGNOSIS — J45909 Unspecified asthma, uncomplicated: Secondary | ICD-10-CM | POA: Diagnosis present

## 2022-09-13 DIAGNOSIS — E669 Obesity, unspecified: Secondary | ICD-10-CM

## 2022-09-13 DIAGNOSIS — Z79890 Hormone replacement therapy (postmenopausal): Secondary | ICD-10-CM

## 2022-09-13 DIAGNOSIS — E876 Hypokalemia: Secondary | ICD-10-CM | POA: Diagnosis present

## 2022-09-13 DIAGNOSIS — M109 Gout, unspecified: Secondary | ICD-10-CM | POA: Diagnosis not present

## 2022-09-13 DIAGNOSIS — Z7902 Long term (current) use of antithrombotics/antiplatelets: Secondary | ICD-10-CM

## 2022-09-13 DIAGNOSIS — Z833 Family history of diabetes mellitus: Secondary | ICD-10-CM | POA: Diagnosis not present

## 2022-09-13 DIAGNOSIS — R110 Nausea: Secondary | ICD-10-CM | POA: Diagnosis not present

## 2022-09-13 DIAGNOSIS — E782 Mixed hyperlipidemia: Secondary | ICD-10-CM | POA: Diagnosis not present

## 2022-09-13 DIAGNOSIS — E039 Hypothyroidism, unspecified: Secondary | ICD-10-CM | POA: Diagnosis not present

## 2022-09-13 DIAGNOSIS — E1169 Type 2 diabetes mellitus with other specified complication: Secondary | ICD-10-CM | POA: Diagnosis not present

## 2022-09-13 DIAGNOSIS — N1831 Chronic kidney disease, stage 3a: Secondary | ICD-10-CM

## 2022-09-13 DIAGNOSIS — R0789 Other chest pain: Secondary | ICD-10-CM | POA: Diagnosis not present

## 2022-09-13 DIAGNOSIS — Z7984 Long term (current) use of oral hypoglycemic drugs: Secondary | ICD-10-CM

## 2022-09-13 DIAGNOSIS — R112 Nausea with vomiting, unspecified: Secondary | ICD-10-CM | POA: Diagnosis not present

## 2022-09-13 DIAGNOSIS — E785 Hyperlipidemia, unspecified: Secondary | ICD-10-CM | POA: Diagnosis not present

## 2022-09-13 DIAGNOSIS — R079 Chest pain, unspecified: Secondary | ICD-10-CM | POA: Diagnosis not present

## 2022-09-13 DIAGNOSIS — Z8249 Family history of ischemic heart disease and other diseases of the circulatory system: Secondary | ICD-10-CM | POA: Diagnosis not present

## 2022-09-13 DIAGNOSIS — Z83511 Family history of glaucoma: Secondary | ICD-10-CM

## 2022-09-13 DIAGNOSIS — Z882 Allergy status to sulfonamides status: Secondary | ICD-10-CM

## 2022-09-13 DIAGNOSIS — Z955 Presence of coronary angioplasty implant and graft: Secondary | ICD-10-CM | POA: Diagnosis not present

## 2022-09-13 DIAGNOSIS — Z79899 Other long term (current) drug therapy: Secondary | ICD-10-CM | POA: Diagnosis not present

## 2022-09-13 DIAGNOSIS — T502X5A Adverse effect of carbonic-anhydrase inhibitors, benzothiadiazides and other diuretics, initial encounter: Secondary | ICD-10-CM | POA: Diagnosis present

## 2022-09-13 DIAGNOSIS — Z8673 Personal history of transient ischemic attack (TIA), and cerebral infarction without residual deficits: Secondary | ICD-10-CM

## 2022-09-13 LAB — CBC WITH DIFFERENTIAL/PLATELET
Abs Immature Granulocytes: 0.08 10*3/uL — ABNORMAL HIGH (ref 0.00–0.07)
Basophils Absolute: 0.1 10*3/uL (ref 0.0–0.1)
Basophils Relative: 1 %
Eosinophils Absolute: 0.9 10*3/uL — ABNORMAL HIGH (ref 0.0–0.5)
Eosinophils Relative: 8 %
HCT: 35.2 % — ABNORMAL LOW (ref 36.0–46.0)
Hemoglobin: 11.8 g/dL — ABNORMAL LOW (ref 12.0–15.0)
Immature Granulocytes: 1 %
Lymphocytes Relative: 18 %
Lymphs Abs: 2.1 10*3/uL (ref 0.7–4.0)
MCH: 29.8 pg (ref 26.0–34.0)
MCHC: 33.5 g/dL (ref 30.0–36.0)
MCV: 88.9 fL (ref 80.0–100.0)
Monocytes Absolute: 0.9 10*3/uL (ref 0.1–1.0)
Monocytes Relative: 7 %
Neutro Abs: 8 10*3/uL — ABNORMAL HIGH (ref 1.7–7.7)
Neutrophils Relative %: 65 %
Platelets: 322 10*3/uL (ref 150–400)
RBC: 3.96 MIL/uL (ref 3.87–5.11)
RDW: 13.8 % (ref 11.5–15.5)
WBC: 12 10*3/uL — ABNORMAL HIGH (ref 4.0–10.5)
nRBC: 0 % (ref 0.0–0.2)

## 2022-09-13 LAB — COMPREHENSIVE METABOLIC PANEL
ALT: 14 U/L (ref 0–44)
AST: 17 U/L (ref 15–41)
Albumin: 3.4 g/dL — ABNORMAL LOW (ref 3.5–5.0)
Alkaline Phosphatase: 73 U/L (ref 38–126)
Anion gap: 10 (ref 5–15)
BUN: 12 mg/dL (ref 8–23)
CO2: 18 mmol/L — ABNORMAL LOW (ref 22–32)
Calcium: 8.5 mg/dL — ABNORMAL LOW (ref 8.9–10.3)
Chloride: 111 mmol/L (ref 98–111)
Creatinine, Ser: 0.81 mg/dL (ref 0.44–1.00)
GFR, Estimated: >60 mL/min (ref >60)
Glucose, Bld: 133 mg/dL — ABNORMAL HIGH (ref 70–99)
Potassium: 3 mmol/L — ABNORMAL LOW (ref 3.5–5.1)
Sodium: 139 mmol/L (ref 135–145)
Total Bilirubin: 0.1 mg/dL — ABNORMAL LOW (ref 0.3–1.2)
Total Protein: 6.1 g/dL — ABNORMAL LOW (ref 6.5–8.1)

## 2022-09-13 LAB — PROTIME-INR
INR: 1.1 (ref 0.8–1.2)
Prothrombin Time: 13.9 seconds (ref 11.4–15.2)

## 2022-09-13 LAB — TROPONIN I (HIGH SENSITIVITY)
Troponin I (High Sensitivity): 50 ng/L — ABNORMAL HIGH (ref <18)
Troponin I (High Sensitivity): 402 ng/L (ref <18)

## 2022-09-13 MED ORDER — FENTANYL CITRATE PF 50 MCG/ML IJ SOSY
50.0000 ug | PREFILLED_SYRINGE | Freq: Once | INTRAMUSCULAR | Status: AC
  Administered 2022-09-13: 50 ug via INTRAVENOUS
  Filled 2022-09-13: qty 1

## 2022-09-13 MED ORDER — ONDANSETRON HCL 4 MG/2ML IJ SOLN
4.0000 mg | Freq: Once | INTRAMUSCULAR | Status: AC
  Administered 2022-09-13: 4 mg via INTRAVENOUS
  Filled 2022-09-13: qty 2

## 2022-09-13 MED ORDER — NITROGLYCERIN IN D5W 200-5 MCG/ML-% IV SOLN
0.0000 ug/min | INTRAVENOUS | Status: DC
  Administered 2022-09-13: 5 ug/min via INTRAVENOUS
  Filled 2022-09-13: qty 250

## 2022-09-13 NOTE — ED Notes (Signed)
Admitting provider bedside 

## 2022-09-13 NOTE — ED Triage Notes (Signed)
Pt BIB [facility], pt coming from home with sudden chest pain beginning at 1815, while eating dinner. Pt took one of her own nitro sublingual and ASA. Pt then given 2 more of nitro in route with EMS and 324 ASA. Pt had one episode of emesis and Zofran was provided by EMS. CP is in the Left side of chest radiating to the left fingers with numbness and tingling.  CBG 131

## 2022-09-13 NOTE — ED Notes (Signed)
Critical trop 402

## 2022-09-13 NOTE — ED Provider Notes (Signed)
[facility] EMERGENCY DEPARTMENT Provider Note   CSN: [id number] Arrival date & time: 09/13/22  2047     History  Chief Complaint  Patient presents with   Chest Pain    Tara Campbell is a 76 y.o. female history of CAD status post multiple stents, here presenting with chest pain.  Patient just had a stent placed 2 weeks ago to the LAD.  Patient also has microvascular disease as well.  Patient was seen about a week ago for chest pain.  Her troponins were negative at that time.  Patient was encouraged to see cardiology but has not seen them yet.  Patient saw primary care doctor for follow-up.  Patient states that she has been having intermittent chest pain since then.  She states that she ate some food around 6 PM and has constant chest pain since then.  She states that it is a pressure sensation.  She took 4 aspirins and 3 nitro and still has chest pressure.  The history is provided by the patient.       Home Medications Prior to Admission medications   Medication Sig Start Date End Date Taking? Authorizing Provider  acetaminophen (TYLENOL) 650 MG CR tablet Take 1 tablet (650 mg total) by mouth every 8 (eight) hours as needed for pain. 10/02/21   Monica Becton, MD  albuterol (VENTOLIN HFA) 108 (90 Base) MCG/ACT inhaler Inhale 2 puffs into the lungs every 6 (six) hours as needed for wheezing. 05/02/22   Parrett, Virgel Bouquet, NP  allopurinol (ZYLOPRIM) 300 MG tablet Take 1 tablet (300 mg total) by mouth 2 (two) times daily. 04/26/22   Monica Becton, MD  apixaban (ELIQUIS) 5 MG TABS tablet Take 1 tablet (5 mg total) by mouth 2 (two) times daily. 04/26/22   Monica Becton, MD  aspirin EC 81 MG tablet Take 1 tablet (81 mg total) by mouth daily. Swallow whole. 08/31/22 08/31/23  Bhagat, Sharrell Ku, PA  atenolol (TENORMIN) 100 MG tablet Take 1 tablet (100 mg total) by mouth daily. 04/26/22   Monica Becton, MD  atorvastatin (LIPITOR) 80 MG tablet  Take 1 tablet (80 mg total) by mouth at bedtime. 04/26/22   Monica Becton, MD  budesonide-formoterol (SYMBICORT) 80-4.5 MCG/ACT inhaler Inhale 2 puffs into the lungs in the morning and at bedtime. 05/02/22   Parrett, Virgel Bouquet, NP  clopidogrel (PLAVIX) 75 MG tablet Take 1 tablet (75 mg total) by mouth daily. 08/17/22   Tonny Bollman, MD  cyanocobalamin (VITAMIN B12) 1000 MCG tablet Take 1,000 mcg by mouth daily.    [provider]  EPINEPHrine 0.3 mg/0.3 mL IJ SOAJ injection Inject 0.3 mg into the muscle as needed for anaphylaxis. Call 9-1-1 after use. 05/02/22   Parrett, Virgel Bouquet, NP  Evolocumab with Infusor (REPATHA PUSHTRONEX SYSTEM) 420 MG/3.5ML SOCT Inject 420 mg into the skin every 30 (thirty) days. 04/26/22   Monica Becton, MD  ezetimibe (ZETIA) 10 MG tablet Take 1 tablet (10 mg total) by mouth daily. 04/26/22   Monica Becton, MD  fluticasone (FLONASE) 50 MCG/ACT nasal spray Place 1 spray into both nostrils daily. 04/26/22   Monica Becton, MD  glucose blood test strip To be used twice daily for testing blood sugars. E11.65 04/03/21   Agapito Games, MD  ipratropium-albuterol (DUONEB) 0.5-2.5 (3) MG/3ML SOLN Take 3 mLs by nebulization every 6 (six) hours as needed. 05/02/22   Parrett, Virgel Bouquet, NP  irbesartan-hydrochlorothiazide (AVALIDE) 300-12.5 MG tablet  Take 0.5 tablets by mouth daily. 04/26/22   Monica Becton, MD  isosorbide mononitrate (IMDUR) 60 MG 24 hr tablet Take 1.5 tablets (90 mg total) by mouth daily. 05/10/22   Laurey Morale, MD  levothyroxine (SYNTHROID) 125 MCG tablet Take 1 tablet (125 mcg total) by mouth daily before breakfast. 09/03/22   Agapito Games, MD  Mepolizumab ([name]) 100 MG/ML SOAJ Inject 1 mL (100 mg total) into the skin every 28 (twenty-eight) days. 12/18/21   Mannam, Colbert Coyer, MD  metFORMIN (GLUCOPHAGE) 500 MG tablet Take 2 tablets (1,000 mg total) by mouth 2 (two) times daily with a meal. 04/26/22    Monica Becton, MD  nitroGLYCERIN (NITROSTAT) 0.4 MG SL tablet Place 1 tablet (0.4 mg total) under the tongue every 5 (five) minutes as needed for chest pain (or tightness). 05/09/22   Breeback, Lonna Cobb, PA-C  Omega-3 Fatty Acids (FISH OIL) 1000 MG CAPS Take 1 capsule (1,000 mg total) by mouth daily. 04/26/22   Monica Becton, MD  Polyvinyl Alcohol-Povidone (REFRESH OP) Place 1 drop into both eyes daily as needed (dry eyes).    [provider]  Semaglutide, 1 MG/DOSE, 4 MG/3ML SOPN Inject 1 mg as directed once a week. 09/10/22   Agapito Games, MD  sertraline (ZOLOFT) 25 MG tablet Take 1 tablet (25 mg total) by mouth daily. 09/10/22   Agapito Games, MD  Vitamin D, Cholecalciferol, 25 MCG (1000 UT) TABS Take 25 mcg by mouth daily in the afternoon. 12/27/21   [place], Alvira Philips, DO  zinc gluconate 50 MG tablet Take 50 mg by mouth daily.    [provider]      Allergies    Sulfamethoxazole and Sulfa antibiotics    Review of Systems   Review of Systems  Cardiovascular:  Positive for chest pain.  All other systems reviewed and are negative.   Physical Exam Updated Vital Signs BP (!) 147/79   Pulse 91   Temp 98.1 F (36.7 C) (Oral)   Resp 17   Ht 5\' 4"  (1.626 m)   Wt 82.6 kg   SpO2 97%   BMI 31.24 kg/m  Physical Exam Vitals and nursing note reviewed.  Constitutional:      Comments: Chronically ill  HENT:     Head: Normocephalic.  Eyes:     Extraocular Movements: Extraocular movements intact.     Pupils: Pupils are equal, round, and reactive to light.  Cardiovascular:     Rate and Rhythm: Normal rate and regular rhythm.     Heart sounds: Normal heart sounds.  Pulmonary:     Effort: Pulmonary effort is normal.     Breath sounds: Normal breath sounds.  Abdominal:     General: Bowel sounds are normal.     Palpations: Abdomen is soft.  Musculoskeletal:        General: Normal range of motion.     Cervical back: Normal range of motion and  neck supple.  Skin:    General: Skin is warm.  Neurological:     General: No focal deficit present.  Psychiatric:        Mood and Affect: Mood normal.        Behavior: Behavior normal.     ED Results / Procedures / Treatments   Labs (all labs ordered are listed, but only abnormal results are displayed) Labs Reviewed  CBC WITH DIFFERENTIAL/PLATELET - Abnormal; Notable for the following components:      Result Value  WBC 12.0 (*)    Hemoglobin 11.8 (*)    HCT 35.2 (*)    Neutro Abs 8.0 (*)    Eosinophils Absolute 0.9 (*)    Abs Immature Granulocytes 0.08 (*)    All other components within normal limits  COMPREHENSIVE METABOLIC PANEL - Abnormal; Notable for the following components:   Potassium 3.0 (*)    CO2 18 (*)    Glucose, Bld 133 (*)    Calcium 8.5 (*)    Total Protein 6.1 (*)    Albumin 3.4 (*)    Total Bilirubin 0.1 (*)    All other components within normal limits  TROPONIN I (HIGH SENSITIVITY) - Abnormal; Notable for the following components:   Troponin I (High Sensitivity) 50 (*)    All other components within normal limits  PROTIME-INR  TROPONIN I (HIGH SENSITIVITY)    EKG None  Radiology DG Chest Port 1 View  Result Date: 09/13/2022 CLINICAL DATA:  Chest pain. EXAM: PORTABLE CHEST 1 VIEW COMPARISON:  09/08/2022. FINDINGS: The heart size and mediastinal contours are within normal limits. There is atherosclerotic calcification of the aorta. No consolidation, effusion, or pneumothorax. Degenerative changes are present in the thoracic spine. No acute osseous abnormality. IMPRESSION: No active disease. Electronically Signed   By: Brett Fairy M.D.   On: 09/13/2022 21:20    Procedures Procedures    CRITICAL CARE Performed by: Wandra Arthurs   Total critical care time: 30 minutes  Critical care time was exclusive of separately billable procedures and treating other patients.  Critical care was necessary to treat or prevent imminent or life-threatening  deterioration.  Critical care was time spent personally by me on the following activities: development of treatment plan with patient and/or surrogate as well as nursing, discussions with consultants, evaluation of patient's response to treatment, examination of patient, obtaining history from patient or surrogate, ordering and performing treatments and interventions, ordering and review of laboratory studies, ordering and review of radiographic studies, pulse oximetry and re-evaluation of patient's condition.   Medications Ordered in ED Medications  nitroGLYCERIN 50 mg in dextrose 5 % 250 mL (0.2 mg/mL) infusion (40 mcg/min Intravenous Rate/Dose Change 09/13/22 2243)  ondansetron (ZOFRAN) injection 4 mg (4 mg Intravenous Given 09/13/22 2119)    ED Course/ Medical Decision Making/ A&P                           Medical Decision Making VENITA SENG is a 76 y.o. female history of CAD here presenting with chest pain.  Patient had cardiac stent placed 2 weeks ago.  Patient has been having intermittent chest pain got worse today.  Patient took nitro and aspirin and still having chest pain.  Concern for possible unstable angina versus NSTEMI.   10:57 PM Patient's troponin was elevated at 50.  Patient is on nitro drip and still has pain.  I did talk to Dr. Hassell Done from cardiology.  She states that this is likely microvascular ischemia.  They do not plan to cath the patient and wants to hold off on heparin right now.  I discussed case with Dr. Alcario Drought from hospitalist.  He requests a second troponin and he will admit the patient if the troponin is not significantly elevated.  Otherwise cardiology will need to admit.  Currently, cardiology plans to see patient in the morning as a consult   Problems Addressed: NSTEMI (non-ST elevated myocardial infarction) [facility]): acute illness or injury  Amount and/or  Complexity of Data Reviewed Labs: ordered. Decision-making details documented in ED  Course. Radiology: ordered and independent interpretation performed. Decision-making details documented in ED Course. ECG/medicine tests: ordered and independent interpretation performed. Decision-making details documented in ED Course.  Risk Prescription drug management. Decision regarding hospitalization.    Final Clinical Impression(s) / ED Diagnoses Final diagnoses:  NSTEMI (non-ST elevated myocardial infarction) [place])    Rx / DC Orders ED Discharge Orders     None         Charlynne Pander, MD 09/13/22 2258

## 2022-09-13 NOTE — Consult Note (Signed)
Cardiology Consultation   Patient ID: GENESISS BIEKER MRN: [id number]; DOB: [birth year]  Admit date: 09/13/2022 Date of Consult: 09/13/2022  PCP:  Hali Marry, MD   [place] Providers Cardiologist:  None  Advanced Heart Failure:  Loralie Champagne, MD       Patient Profile:   Tara Campbell is a 76 y.o. female with a hx of CAD s/p PCI to LAD on 08-31-22, w/ significant residual dz that will be medically managed, who is being seen 09/13/2022 for the evaluation of CP at the request of Dr Darl Householder [facility]).  History of Present Illness:   Ms. Bilson has h/o CAD w/ severe calcific LAD disease, s/p recent PCI to the pLAD on 08-31-22, who presented to the ED after an episode of severe CP at home. Pt has severely calcified LAD w/ complex diffuse stenosis of the prox, mid and distal vessel. She has chronic angina that has been relatively refractory to medical therapy, and thus PCI to prox vessel was performed in effort to improve her symptoms. She has h/o PCI to the RCA as well with ~50% ISR on LHC 08-31-22; LCx has no obstructive dz. Since the PCI, she has been back to the ED on two separate occasions (including tonight) with recurrent CP. Today, she says she has been having intermittent CP for the past few days that is a midsternal pressure sensation and has been very severe at times. She took ASA x 4 and NTG SL x3 at home w/o relief.    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   Otomycosis of right ear 09/27/2017   Paroxysmal atrial fibrillation ([facility])    Thyroid disease    Trochanteric bursitis of right hip 05/29/2017    Past Surgical History:  Procedure Laterality Date    ANGIOPLASTY     CARDIAC CATHETERIZATION     COLONOSCOPY W/ POLYPECTOMY  01/04/2015   CORONARY STENT INTERVENTION N/A 08/31/2022   Procedure: CORONARY STENT INTERVENTION;  Surgeon: Sherren Mocha, MD;  Location: [place] [facility];  Service: Cardiovascular;  Laterality: N/A;   HEMORRHOID BANDING     LEFT HEART CATH AND CORONARY ANGIOGRAPHY N/A 04/26/2021   Procedure: LEFT HEART CATH AND CORONARY ANGIOGRAPHY;  Surgeon: Sherren Mocha, MD;  Location: [place] [facility];  Service: Cardiovascular;  Laterality: N/A;     Home Medications:  Prior to Admission medications   Medication Sig Start Date End Date Taking? Authorizing Provider  acetaminophen (TYLENOL) 650 MG CR tablet Take 1 tablet (650 mg total) by mouth every 8 (eight) hours as needed for pain. 10/02/21   Silverio Decamp, MD  albuterol (VENTOLIN HFA) 108 (90 Base) MCG/ACT inhaler Inhale 2 puffs into the lungs every 6 (six) hours as needed for wheezing. 05/02/22   Parrett, Fonnie Mu, NP  allopurinol (ZYLOPRIM) 300 MG tablet Take 1 tablet (300 mg total) by mouth 2 (two) times daily. 04/26/22   Silverio Decamp, MD  apixaban (ELIQUIS) 5 MG TABS tablet Take 1 tablet (5 mg total) by mouth 2 (two) times daily. 04/26/22   Aundria Mems  J, MD  aspirin EC 81 MG tablet Take 1 tablet (81 mg total) by mouth daily. Swallow whole. 08/31/22 08/31/23  Bhagat, Sharrell Ku, PA  atenolol (TENORMIN) 100 MG tablet Take 1 tablet (100 mg total) by mouth daily. 04/26/22   Monica Becton, MD  atorvastatin (LIPITOR) 80 MG tablet Take 1 tablet (80 mg total) by mouth at bedtime. 04/26/22   Monica Becton, MD  budesonide-formoterol (SYMBICORT) 80-4.5 MCG/ACT inhaler Inhale 2 puffs into the lungs in the morning and at bedtime. 05/02/22   Parrett, Virgel Bouquet, NP  clopidogrel (PLAVIX) 75 MG tablet Take 1 tablet (75 mg total) by mouth daily. 08/17/22   Tonny Bollman, MD  cyanocobalamin (VITAMIN B12) 1000 MCG tablet Take 1,000 mcg by mouth daily.     [provider]  EPINEPHrine 0.3 mg/0.3 mL IJ SOAJ injection Inject 0.3 mg into the muscle as needed for anaphylaxis. Call 9-1-1 after use. 05/02/22   Parrett, Virgel Bouquet, NP  Evolocumab with Infusor (REPATHA PUSHTRONEX SYSTEM) 420 MG/3.5ML SOCT Inject 420 mg into the skin every 30 (thirty) days. 04/26/22   Monica Becton, MD  ezetimibe (ZETIA) 10 MG tablet Take 1 tablet (10 mg total) by mouth daily. 04/26/22   Monica Becton, MD  fluticasone (FLONASE) 50 MCG/ACT nasal spray Place 1 spray into both nostrils daily. 04/26/22   Monica Becton, MD  glucose blood test strip To be used twice daily for testing blood sugars. E11.65 04/03/21   Agapito Games, MD  ipratropium-albuterol (DUONEB) 0.5-2.5 (3) MG/3ML SOLN Take 3 mLs by nebulization every 6 (six) hours as needed. 05/02/22   Parrett, Virgel Bouquet, NP  irbesartan-hydrochlorothiazide (AVALIDE) 300-12.5 MG tablet Take 0.5 tablets by mouth daily. 04/26/22   Monica Becton, MD  isosorbide mononitrate (IMDUR) 60 MG 24 hr tablet Take 1.5 tablets (90 mg total) by mouth daily. 05/10/22   Laurey Morale, MD  levothyroxine (SYNTHROID) 125 MCG tablet Take 1 tablet (125 mcg total) by mouth daily before breakfast. 09/03/22   Agapito Games, MD  Mepolizumab ([name]) 100 MG/ML SOAJ Inject 1 mL (100 mg total) into the skin every 28 (twenty-eight) days. 12/18/21   Mannam, Colbert Coyer, MD  metFORMIN (GLUCOPHAGE) 500 MG tablet Take 2 tablets (1,000 mg total) by mouth 2 (two) times daily with a meal. 04/26/22   Monica Becton, MD  nitroGLYCERIN (NITROSTAT) 0.4 MG SL tablet Place 1 tablet (0.4 mg total) under the tongue every 5 (five) minutes as needed for chest pain (or tightness). 05/09/22   Breeback, Lonna Cobb, PA-C  Omega-3 Fatty Acids (FISH OIL) 1000 MG CAPS Take 1 capsule (1,000 mg total) by mouth daily. 04/26/22   Monica Becton, MD  Polyvinyl Alcohol-Povidone (REFRESH OP) Place 1 drop into both eyes daily as needed (dry  eyes).    [provider]  Semaglutide, 1 MG/DOSE, 4 MG/3ML SOPN Inject 1 mg as directed once a week. 09/10/22   Agapito Games, MD  sertraline (ZOLOFT) 25 MG tablet Take 1 tablet (25 mg total) by mouth daily. 09/10/22   Agapito Games, MD  Vitamin D, Cholecalciferol, 25 MCG (1000 UT) TABS Take 25 mcg by mouth daily in the afternoon. 12/27/21   [place], Alvira Philips, DO  zinc gluconate 50 MG tablet Take 50 mg by mouth daily.    [provider]    Inpatient Medications: Scheduled Meds:  fentaNYL (SUBLIMAZE) injection  50 mcg Intravenous Once   Continuous Infusions:  nitroGLYCERIN 55 mcg/min (09/13/22 2334)  PRN Meds:   Allergies:    Allergies  Allergen Reactions   Sulfamethoxazole Rash    HIVES   Sulfa Antibiotics Rash    Social History:   Social History   Socioeconomic History   Marital status: Divorced    Spouse name: Not on file   Number of children: 2   Years of education: 13.5   Highest education level: 12th grade  Occupational History    Comment: retired  Tobacco Use   Smoking status: Never   Smokeless tobacco: Never  Vaping Use   Vaping Use: Never used  Substance and Sexual Activity   Alcohol use: Yes    Comment: Rare   Drug use: No   Sexual activity: Not Currently  Other Topics Concern   Not on file  Social History Narrative   Lives alone. Likes to crafts and paint in her free time.      Right Handed    Lives in a one story home    Social Determinants of Health   Financial Resource Strain: Low Risk  (04/06/2021)   Overall Financial Resource Strain (CARDIA)    Difficulty of Paying Living Expenses: Not hard at all  Food Insecurity: No Food Insecurity (04/06/2021)   Hunger Vital Sign    Worried About Running Out of Food in the Last Year: Never true    Ran Out of Food in the Last Year: Never true  Transportation Needs: No Transportation Needs (04/06/2021)   PRAPARE - Hydrologist (Medical): No    Lack  of Transportation (Non-Medical): No  Physical Activity: Inactive (04/06/2021)   Exercise Vital Sign    Days of Exercise per Week: 0 days    Minutes of Exercise per Session: 0 min  Stress: No Stress Concern Present (04/06/2021)   [address]    Feeling of Stress : Not at all  Social Connections: Moderately Isolated (04/06/2021)   Social Connection and Isolation Panel [NHANES]    Frequency of Communication with Friends and Family: More than three times a week    Frequency of Social Gatherings with Friends and Family: More than three times a week    Attends Religious Services: Never    Marine scientist or Organizations: Yes    Attends Music therapist: More than 4 times per year    Marital Status: Divorced  Intimate Partner Violence: Not At Risk (04/06/2021)   Humiliation, Afraid, Rape, and Kick questionnaire    Fear of Current or Ex-Partner: No    Emotionally Abused: No    Physically Abused: No    Sexually Abused: No    Family History:    Family History  Problem Relation Age of Onset   Hypertension Mother    Hyperlipidemia Mother    Alzheimer's disease Mother    Vascular Disease Mother    Glaucoma Father    Heart disease Father    Hypertension Father    Diabetes Father    Hyperlipidemia Father    Skin cancer Father    Diabetes Paternal Grandmother    Colon cancer Neg Hx    Esophageal cancer Neg Hx      ROS:  Please see the history of present illness.   All other ROS reviewed and negative.     Physical Exam/Data:   Vitals:   09/13/22 2305 09/13/22 2310 09/13/22 2315 09/13/22 2330  BP: (!) 143/79 (!) 141/75 136/75 138/74  Pulse: 92 93 93  96  Resp: 18 19 (!) 21 (!) 22  Temp:      TempSrc:      SpO2: 95% 96% 95% 95%  Weight:      Height:       No intake or output data in the 24 hours ending 09/13/22 2335    09/13/2022    8:52 PM 09/10/2022    3:05 PM 09/08/2022   10:22 PM  Last 3  Weights  Weight (lbs) 182 lb 186 lb 183 lb  Weight (kg) 82.555 kg 84.369 kg 83.008 kg     Body mass index is 31.24 kg/m.  General:  Well nourished, well developed, in no acute distress HEENT: normal Neck: no JVD Vascular: No carotid bruits; Distal pulses 2+ bilaterally Cardiac:  normal S1, S2; RRR; no murmur  Lungs:  clear to auscultation bilaterally, no wheezing, rhonchi or rales  Abd: soft, nontender, no hepatomegaly  Ext: no edema Musculoskeletal:  BUE and BLE strength normal and equal Skin: warm and dry  Neuro:  no focal abnormalities noted Psych:  Normal affect   EKG:  The EKG was personally reviewed and demonstrates:  NSR, no ischemic ST changes Telemetry:  Telemetry was personally reviewed and demonstrates:  NSR  Relevant CV Studies: 08-14-22 PET  This study is high risk due to anterior and anteroseptal ischemia, the presence of TID, a drop in EF with stress and abnormal myocardial blood flow reserve that is most notable in the LAD territory which matches perfusion defect. Recommend cath for further evaluation.   LV perfusion is abnormal. There is evidence of ischemia. Defect 1: There is a medium defect with severe reduction in uptake present in the apical to basal anteroseptal and apex location(s) that is reversible. There is abnormal wall motion in the defect area. Consistent with ischemia. Defect 2: There is a medium defect with moderate reduction in uptake present in the apical to basal anterior location(s) that is reversible. Defect 3: There is a small defect with moderate reduction in uptake present in the apical lateral location(s) that is reversible. There is abnormal wall motion in the defect area. Consistent with ischemia.   Rest left ventricular function is normal. Rest EF: 63 %. EF dropped to 59% with stress. End diastolic cavity size is normal. End systolic cavity size is normal.   Myocardial blood flow was computed to be 1.82ml/g/min at rest and 1.52ml/g/min at stress.  Global myocardial blood flow reserve was 1.33 and was abnormal. This was most notable in the LAD territory consistent with perfusion abnormalities. Recommend cath as above.   Coronary calcium was present on the attenuation correction CT images. Severe coronary calcifications were present. Coronary calcifications were present in the left anterior descending artery, left circumflex artery and right coronary artery distribution(s).   Findings are consistent with ischemia. The study is high risk.  [place] 08-31-22 1.  Severe single-vessel coronary artery disease with calcified complex diffuse stenosis of the proximal, mid, and distal LAD. 2.  Patency of the left main and left circumflex 3.  Previous stenting of the RCA with continued patency of the RCA stent, moderate nonobstructive proximal and distal RCA stenoses of 50% not significantly changed from the previous cath study 4.  Successful PCI of the LAD with stenting of the proximal vessel using a 2.5 x 22 mm Onyx frontier DES and angioplasty of the mid LAD using a 2.0 mm balloon  Laboratory Data:  High Sensitivity Troponin:   Recent Labs  Lab 09/08/22 2244 09/09/22 0100 09/13/22  2100  TROPONINIHS 12 12 50*     Chemistry Recent Labs  Lab 09/08/22 2244 09/13/22 2100  NA 138 139  K 3.8 3.0*  CL 107 111  CO2 22 18*  GLUCOSE 130* 133*  BUN 12 12  CREATININE 1.00 0.81  CALCIUM 9.5 8.5*  GFRNONAA 58* >60  ANIONGAP 9 10    Recent Labs  Lab 09/13/22 2100  PROT 6.1*  ALBUMIN 3.4*  AST 17  ALT 14  ALKPHOS 73  BILITOT 0.1*   Lipids No results for input(s): "CHOL", "TRIG", "HDL", "LABVLDL", "LDLCALC", "CHOLHDL" in the last 168 hours.  Hematology Recent Labs  Lab 09/08/22 2244 09/13/22 2100  WBC 11.2* 12.0*  RBC 4.10 3.96  HGB 12.0 11.8*  HCT 35.6* 35.2*  MCV 86.8 88.9  MCH 29.3 29.8  MCHC 33.7 33.5  RDW 13.7 13.8  PLT 350 322   Thyroid No results for input(s): "TSH", "FREET4" in the last 168 hours.  BNPNo results for  input(s): "BNP", "PROBNP" in the last 168 hours.  DDimer No results for input(s): "DDIMER" in the last 168 hours.   Radiology/Studies:  DG Chest Port 1 View  Result Date: 09/13/2022 CLINICAL DATA:  Chest pain. EXAM: PORTABLE CHEST 1 VIEW COMPARISON:  09/08/2022. FINDINGS: The heart size and mediastinal contours are within normal limits. There is atherosclerotic calcification of the aorta. No consolidation, effusion, or pneumothorax. Degenerative changes are present in the thoracic spine. No acute osseous abnormality. IMPRESSION: No active disease. Electronically Signed   By: Brett Fairy M.D.   On: 09/13/2022 21:20     Assessment and Plan:   CAD: severe complex total-vessel LAD disease. S/p PCI to prox portion in effort to relieve symptoms, but she has ongoing angina that may have to be medically managed. HS trop is mildly elevated in setting of elevated HR/BP and severe diffuse dz that has not been intervened upon/not amenable to PCI, and not a good target for bypass. Acute stent thrombosis less likely given lack of ischemic EKG changes, but would cont to cycle troponins. If they increase dramatically, may need to consider repeat LHC. Would increase imdur to 120mg  daily; consider adding ranexa as outpatient. She is on max-dose atenolol at home. CCB could also potentially be added for anti-anginal effect. HTN/HLD: restart home meds H/o PAF and TIA: currently in NSR. On eliquis for CVA prophylaxis at home DM2: mgmt as per primary medical team   Risk Assessment/Risk Scores:     TIMI Risk Score for Unstable Angina or Non-ST Elevation MI:   The patient's TIMI risk score is 6, which indicates a 41% risk of all cause mortality, new or recurrent myocardial infarction or need for urgent revascularization in the next 14 days.    CHA2DS2-VASc Score = 7   This indicates a 11.2% annual risk of stroke. The patient's score is based upon: CHF History: 1 HTN History: 1 Diabetes History: 1 Stroke  History: 0 Vascular Disease History: 1 Age Score: 2 Gender Score: 1      For questions or updates, please contact [place] Please consult [email or web address] for contact info under    Signed, Rudean Curt, MD, [facility] 09/13/2022 11:35 PM

## 2022-09-14 ENCOUNTER — Encounter (HOSPITAL_COMMUNITY): Payer: Self-pay | Admitting: Cardiology

## 2022-09-14 ENCOUNTER — Encounter (HOSPITAL_COMMUNITY)
Admission: EM | Disposition: A | Discharge status: Home / Self Care | Payer: Self-pay | Source: Home / Self Care | Attending: Student

## 2022-09-14 ENCOUNTER — Encounter: Payer: Self-pay | Admitting: Family Medicine

## 2022-09-14 DIAGNOSIS — T502X5A Adverse effect of carbonic-anhydrase inhibitors, benzothiadiazides and other diuretics, initial encounter: Secondary | ICD-10-CM | POA: Diagnosis present

## 2022-09-14 DIAGNOSIS — I214 Non-ST elevation (NSTEMI) myocardial infarction: Secondary | ICD-10-CM | POA: Diagnosis present

## 2022-09-14 DIAGNOSIS — I2510 Atherosclerotic heart disease of native coronary artery without angina pectoris: Secondary | ICD-10-CM | POA: Diagnosis not present

## 2022-09-14 DIAGNOSIS — Z7901 Long term (current) use of anticoagulants: Secondary | ICD-10-CM | POA: Diagnosis not present

## 2022-09-14 DIAGNOSIS — Z83438 Family history of other disorder of lipoprotein metabolism and other lipidemia: Secondary | ICD-10-CM | POA: Diagnosis not present

## 2022-09-14 DIAGNOSIS — E782 Mixed hyperlipidemia: Secondary | ICD-10-CM | POA: Diagnosis present

## 2022-09-14 DIAGNOSIS — I10 Essential (primary) hypertension: Secondary | ICD-10-CM | POA: Diagnosis present

## 2022-09-14 DIAGNOSIS — E876 Hypokalemia: Secondary | ICD-10-CM | POA: Diagnosis present

## 2022-09-14 DIAGNOSIS — E669 Obesity, unspecified: Secondary | ICD-10-CM | POA: Diagnosis present

## 2022-09-14 DIAGNOSIS — Z7951 Long term (current) use of inhaled steroids: Secondary | ICD-10-CM | POA: Diagnosis not present

## 2022-09-14 DIAGNOSIS — Z8249 Family history of ischemic heart disease and other diseases of the circulatory system: Secondary | ICD-10-CM | POA: Diagnosis not present

## 2022-09-14 DIAGNOSIS — E1169 Type 2 diabetes mellitus with other specified complication: Secondary | ICD-10-CM

## 2022-09-14 DIAGNOSIS — Z955 Presence of coronary angioplasty implant and graft: Secondary | ICD-10-CM | POA: Diagnosis not present

## 2022-09-14 DIAGNOSIS — E118 Type 2 diabetes mellitus with unspecified complications: Secondary | ICD-10-CM | POA: Diagnosis not present

## 2022-09-14 DIAGNOSIS — I25110 Atherosclerotic heart disease of native coronary artery with unstable angina pectoris: Secondary | ICD-10-CM

## 2022-09-14 DIAGNOSIS — E039 Hypothyroidism, unspecified: Secondary | ICD-10-CM

## 2022-09-14 DIAGNOSIS — M109 Gout, unspecified: Secondary | ICD-10-CM | POA: Diagnosis present

## 2022-09-14 DIAGNOSIS — Z83511 Family history of glaucoma: Secondary | ICD-10-CM | POA: Diagnosis not present

## 2022-09-14 DIAGNOSIS — Z882 Allergy status to sulfonamides status: Secondary | ICD-10-CM | POA: Diagnosis not present

## 2022-09-14 DIAGNOSIS — I480 Paroxysmal atrial fibrillation: Secondary | ICD-10-CM | POA: Diagnosis present

## 2022-09-14 DIAGNOSIS — J45909 Unspecified asthma, uncomplicated: Secondary | ICD-10-CM | POA: Diagnosis present

## 2022-09-14 DIAGNOSIS — I252 Old myocardial infarction: Secondary | ICD-10-CM | POA: Diagnosis not present

## 2022-09-14 DIAGNOSIS — H409 Unspecified glaucoma: Secondary | ICD-10-CM | POA: Diagnosis present

## 2022-09-14 DIAGNOSIS — Z6831 Body mass index (BMI) 31.0-31.9, adult: Secondary | ICD-10-CM | POA: Diagnosis not present

## 2022-09-14 DIAGNOSIS — Z833 Family history of diabetes mellitus: Secondary | ICD-10-CM | POA: Diagnosis not present

## 2022-09-14 DIAGNOSIS — Z79899 Other long term (current) drug therapy: Secondary | ICD-10-CM | POA: Diagnosis not present

## 2022-09-14 HISTORY — PX: LEFT HEART CATH AND CORONARY ANGIOGRAPHY: CATH118249

## 2022-09-14 LAB — LIPID PANEL
Cholesterol: 96 mg/dL (ref 0–200)
HDL: 49 mg/dL (ref >40)
LDL Cholesterol: 18 mg/dL (ref 0–99)
Total CHOL/HDL Ratio: 2 RATIO
Triglycerides: 144 mg/dL (ref <150)
VLDL: 29 mg/dL (ref 0–40)

## 2022-09-14 LAB — BASIC METABOLIC PANEL
Anion gap: 9 (ref 5–15)
BUN: 13 mg/dL (ref 8–23)
CO2: 24 mmol/L (ref 22–32)
Calcium: 9.9 mg/dL (ref 8.9–10.3)
Chloride: 106 mmol/L (ref 98–111)
Creatinine, Ser: 0.96 mg/dL (ref 0.44–1.00)
GFR, Estimated: >60 mL/min (ref >60)
Glucose, Bld: 113 mg/dL — ABNORMAL HIGH (ref 70–99)
Potassium: 4.4 mmol/L (ref 3.5–5.1)
Sodium: 139 mmol/L (ref 135–145)

## 2022-09-14 LAB — RENAL FUNCTION PANEL
Albumin: 3.7 g/dL (ref 3.5–5.0)
Anion gap: 15 (ref 5–15)
BUN: 13 mg/dL (ref 8–23)
CO2: 16 mmol/L — ABNORMAL LOW (ref 22–32)
Calcium: 9.7 mg/dL (ref 8.9–10.3)
Chloride: 108 mmol/L (ref 98–111)
Creatinine, Ser: 0.9 mg/dL (ref 0.44–1.00)
GFR, Estimated: >60 mL/min (ref >60)
Glucose, Bld: 112 mg/dL — ABNORMAL HIGH (ref 70–99)
Phosphorus: 3.1 mg/dL (ref 2.5–4.6)
Potassium: 4.4 mmol/L (ref 3.5–5.1)
Sodium: 139 mmol/L (ref 135–145)

## 2022-09-14 LAB — TROPONIN I (HIGH SENSITIVITY)
Troponin I (High Sensitivity): 1074 ng/L (ref <18)
Troponin I (High Sensitivity): 1204 ng/L (ref <18)
Troponin I (High Sensitivity): 782 ng/L (ref <18)

## 2022-09-14 LAB — APTT: aPTT: 42 seconds — ABNORMAL HIGH (ref 24–36)

## 2022-09-14 LAB — MAGNESIUM: Magnesium: 1.9 mg/dL (ref 1.7–2.4)

## 2022-09-14 LAB — HEPARIN LEVEL (UNFRACTIONATED): Heparin Unfractionated: 0.21 IU/mL — ABNORMAL LOW (ref 0.30–0.70)

## 2022-09-14 LAB — GLUCOSE, CAPILLARY: Glucose-Capillary: 96 mg/dL (ref 70–99)

## 2022-09-14 SURGERY — LEFT HEART CATH AND CORONARY ANGIOGRAPHY
Anesthesia: LOCAL

## 2022-09-14 MED ORDER — ASPIRIN 81 MG PO TBEC: 81.0000 mg | DELAYED_RELEASE_TABLET | Freq: Every day | ORAL | Status: DC

## 2022-09-14 MED ORDER — ALLOPURINOL 300 MG PO TABS
300.0000 mg | ORAL_TABLET | Freq: Two times a day (BID) | ORAL | Status: DC
  Administered 2022-09-14 – 2022-09-15 (×2): 300 mg via ORAL
  Filled 2022-09-14 (×5): qty 1

## 2022-09-14 MED ORDER — CLOPIDOGREL BISULFATE 75 MG PO TABS
75.0000 mg | ORAL_TABLET | Freq: Every day | ORAL | Status: DC
  Administered 2022-09-14 – 2022-09-15 (×2): 75 mg via ORAL
  Filled 2022-09-14 (×2): qty 1

## 2022-09-14 MED ORDER — HEPARIN (PORCINE) IN NACL 1000-0.9 UT/500ML-% IV SOLN
INTRAVENOUS | Status: AC
  Filled 2022-09-14: qty 1000

## 2022-09-14 MED ORDER — NITROGLYCERIN 0.4 MG SL SUBL: 0.4000 mg | SUBLINGUAL_TABLET | SUBLINGUAL | Status: DC | PRN

## 2022-09-14 MED ORDER — SERTRALINE HCL 50 MG PO TABS
25.0000 mg | ORAL_TABLET | Freq: Every day | ORAL | Status: DC
  Administered 2022-09-14 – 2022-09-15 (×2): 25 mg via ORAL
  Filled 2022-09-14 (×3): qty 1

## 2022-09-14 MED ORDER — ONDANSETRON HCL 4 MG/2ML IJ SOLN: 4.0000 mg | Freq: Four times a day (QID) | INTRAMUSCULAR | Status: DC | PRN

## 2022-09-14 MED ORDER — SODIUM CHLORIDE 0.9 % WEIGHT BASED INFUSION: 1.0000 mL/kg/h | INTRAVENOUS | Status: DC

## 2022-09-14 MED ORDER — HYDRALAZINE HCL 20 MG/ML IJ SOLN: 10.0000 mg | INTRAMUSCULAR | Status: AC | PRN

## 2022-09-14 MED ORDER — SODIUM CHLORIDE 0.9% FLUSH
3.0000 mL | Freq: Two times a day (BID) | INTRAVENOUS | Status: DC
  Administered 2022-09-15: 3 mL via INTRAVENOUS

## 2022-09-14 MED ORDER — LIDOCAINE HCL (PF) 1 % IJ SOLN
INTRAMUSCULAR | Status: AC
  Filled 2022-09-14: qty 30

## 2022-09-14 MED ORDER — ZINC SULFATE 220 (50 ZN) MG PO CAPS
220.0000 mg | ORAL_CAPSULE | Freq: Every day | ORAL | Status: DC
  Administered 2022-09-14: 220 mg via ORAL
  Filled 2022-09-14: qty 1

## 2022-09-14 MED ORDER — VITAMIN D 25 MCG (1000 UNIT) PO TABS
25.0000 ug | ORAL_TABLET | Freq: Every day | ORAL | Status: DC
  Administered 2022-09-14: 25 ug via ORAL
  Filled 2022-09-14 (×2): qty 1

## 2022-09-14 MED ORDER — FENTANYL CITRATE (PF) 100 MCG/2ML IJ SOLN
INTRAMUSCULAR | Status: DC | PRN
  Administered 2022-09-14: 25 ug via INTRAVENOUS

## 2022-09-14 MED ORDER — IRBESARTAN 150 MG PO TABS
150.0000 mg | ORAL_TABLET | Freq: Every evening | ORAL | Status: DC
  Administered 2022-09-14: 150 mg via ORAL
  Filled 2022-09-14 (×2): qty 1

## 2022-09-14 MED ORDER — SODIUM CHLORIDE 0.9 % WEIGHT BASED INFUSION: 3.0000 mL/kg/h | INTRAVENOUS | Status: AC

## 2022-09-14 MED ORDER — ISOSORBIDE MONONITRATE ER 60 MG PO TB24
120.0000 mg | ORAL_TABLET | Freq: Every day | ORAL | Status: DC
  Administered 2022-09-14 – 2022-09-15 (×2): 120 mg via ORAL
  Filled 2022-09-14: qty 2
  Filled 2022-09-14: qty 4

## 2022-09-14 MED ORDER — ASPIRIN 81 MG PO TBEC
81.0000 mg | DELAYED_RELEASE_TABLET | Freq: Every day | ORAL | Status: DC
  Administered 2022-09-14 – 2022-09-15 (×2): 81 mg via ORAL
  Filled 2022-09-14 (×2): qty 1

## 2022-09-14 MED ORDER — HYDROCHLOROTHIAZIDE 12.5 MG PO TABS: 6.2500 mg | ORAL_TABLET | Freq: Every evening | ORAL | Status: DC

## 2022-09-14 MED ORDER — SODIUM CHLORIDE 0.9 % WEIGHT BASED INFUSION
1.0000 mL/kg/h | INTRAVENOUS | Status: AC
  Administered 2022-09-15: 1 mL/kg/h via INTRAVENOUS

## 2022-09-14 MED ORDER — HEPARIN (PORCINE) IN NACL 1000-0.9 UT/500ML-% IV SOLN
INTRAVENOUS | Status: DC | PRN
  Administered 2022-09-14 (×2): 500 mL via INTRACORONARY

## 2022-09-14 MED ORDER — EZETIMIBE 10 MG PO TABS
10.0000 mg | ORAL_TABLET | Freq: Every day | ORAL | Status: DC
  Administered 2022-09-14 – 2022-09-15 (×2): 10 mg via ORAL
  Filled 2022-09-14 (×3): qty 1

## 2022-09-14 MED ORDER — VITAMIN B-12 1000 MCG PO TABS
1000.0000 ug | ORAL_TABLET | Freq: Every day | ORAL | Status: DC
  Administered 2022-09-14 – 2022-09-15 (×2): 1000 ug via ORAL
  Filled 2022-09-14 (×2): qty 1

## 2022-09-14 MED ORDER — SODIUM CHLORIDE 0.9% FLUSH: 3.0000 mL | INTRAVENOUS | Status: DC | PRN

## 2022-09-14 MED ORDER — ATORVASTATIN CALCIUM 80 MG PO TABS
80.0000 mg | ORAL_TABLET | Freq: Every day | ORAL | Status: DC
  Administered 2022-09-14: 80 mg via ORAL
  Filled 2022-09-14: qty 1

## 2022-09-14 MED ORDER — FLUTICASONE PROPIONATE 50 MCG/ACT NA SUSP: 1.0000 | Freq: Every day | NASAL | Status: DC

## 2022-09-14 MED ORDER — POTASSIUM CHLORIDE CRYS ER 20 MEQ PO TBCR
40.0000 meq | EXTENDED_RELEASE_TABLET | Freq: Once | ORAL | Status: AC
  Administered 2022-09-14: 20 meq via ORAL
  Filled 2022-09-14: qty 2

## 2022-09-14 MED ORDER — LEVOTHYROXINE SODIUM 25 MCG PO TABS
125.0000 ug | ORAL_TABLET | Freq: Every day | ORAL | Status: DC
  Administered 2022-09-14 – 2022-09-15 (×2): 125 ug via ORAL
  Filled 2022-09-14 (×3): qty 1

## 2022-09-14 MED ORDER — APIXABAN 5 MG PO TABS
5.0000 mg | ORAL_TABLET | Freq: Two times a day (BID) | ORAL | Status: DC
  Administered 2022-09-15: 5 mg via ORAL
  Filled 2022-09-14: qty 1

## 2022-09-14 MED ORDER — HEPARIN (PORCINE) 25000 UT/250ML-% IV SOLN
1250.0000 [IU]/h | INTRAVENOUS | Status: DC
  Administered 2022-09-14: 1100 [IU]/h via INTRAVENOUS
  Filled 2022-09-14: qty 250

## 2022-09-14 MED ORDER — MIDAZOLAM HCL 2 MG/2ML IJ SOLN
INTRAMUSCULAR | Status: AC
  Filled 2022-09-14: qty 2

## 2022-09-14 MED ORDER — IOHEXOL 350 MG/ML SOLN
INTRAVENOUS | Status: DC | PRN
  Administered 2022-09-14: 40 mL

## 2022-09-14 MED ORDER — SODIUM CHLORIDE 0.9 % IV SOLN: 250.0000 mL | INTRAVENOUS | Status: DC | PRN

## 2022-09-14 MED ORDER — IRBESARTAN-HYDROCHLOROTHIAZIDE 300-12.5 MG PO TABS: 0.5000 | ORAL_TABLET | Freq: Every evening | ORAL | Status: DC

## 2022-09-14 MED ORDER — LIDOCAINE HCL (PF) 1 % IJ SOLN
INTRAMUSCULAR | Status: DC | PRN
  Administered 2022-09-14: 10 mL via SUBCUTANEOUS

## 2022-09-14 MED ORDER — ATENOLOL 25 MG PO TABS
100.0000 mg | ORAL_TABLET | Freq: Every day | ORAL | Status: DC
  Administered 2022-09-14 – 2022-09-15 (×2): 100 mg via ORAL
  Filled 2022-09-14 (×2): qty 4

## 2022-09-14 MED ORDER — ACETAMINOPHEN 325 MG PO TABS: 650.0000 mg | ORAL_TABLET | ORAL | Status: DC | PRN

## 2022-09-14 MED ORDER — MOMETASONE FURO-FORMOTEROL FUM 100-5 MCG/ACT IN AERO
2.0000 | INHALATION_SPRAY | Freq: Two times a day (BID) | RESPIRATORY_TRACT | Status: DC
  Administered 2022-09-14 – 2022-09-15 (×3): 2 via RESPIRATORY_TRACT
  Filled 2022-09-14: qty 8.8

## 2022-09-14 MED ORDER — NITROGLYCERIN IN D5W 200-5 MCG/ML-% IV SOLN: 2.0000 ug/min | INTRAVENOUS | Status: DC

## 2022-09-14 MED ORDER — SODIUM CHLORIDE 0.9 % WEIGHT BASED INFUSION: 3.0000 mL/kg/h | INTRAVENOUS | Status: DC

## 2022-09-14 MED ORDER — ALBUTEROL SULFATE (2.5 MG/3ML) 0.083% IN NEBU: 2.5000 mg | INHALATION_SOLUTION | Freq: Four times a day (QID) | RESPIRATORY_TRACT | Status: DC | PRN

## 2022-09-14 MED ORDER — LABETALOL HCL 5 MG/ML IV SOLN: 10.0000 mg | INTRAVENOUS | Status: AC | PRN

## 2022-09-14 MED ORDER — MIDAZOLAM HCL 2 MG/2ML IJ SOLN
INTRAMUSCULAR | Status: DC | PRN
  Administered 2022-09-14: 2 mg via INTRAVENOUS

## 2022-09-14 MED ORDER — FENTANYL CITRATE (PF) 100 MCG/2ML IJ SOLN
INTRAMUSCULAR | Status: AC
  Filled 2022-09-14: qty 2

## 2022-09-14 MED ORDER — METOPROLOL TARTRATE 5 MG/5ML IV SOLN
2.5000 mg | Freq: Once | INTRAVENOUS | Status: AC
  Administered 2022-09-14: 2.5 mg via INTRAVENOUS
  Filled 2022-09-14: qty 5

## 2022-09-14 SURGICAL SUPPLY — 10 items
CATH INFINITI 5FR MULTPACK ANG (CATHETERS) IMPLANT
CLOSURE PERCLOSE PROSTYLE (VASCULAR PRODUCTS) IMPLANT
KIT HEART LEFT (KITS) ×1 IMPLANT
KIT MICROPUNCTURE NIT STIFF (SHEATH) IMPLANT
PACK CARDIAC CATHETERIZATION (CUSTOM PROCEDURE TRAY) ×1 IMPLANT
SHEATH PINNACLE 5F 10CM (SHEATH) IMPLANT
SYR MEDRAD MARK 7 150ML (SYRINGE) ×1 IMPLANT
TRANSDUCER W/STOPCOCK (MISCELLANEOUS) ×1 IMPLANT
TUBING CIL FLEX 10 FLL-RA (TUBING) ×1 IMPLANT
WIRE EMERALD 3MM-J .035X150CM (WIRE) IMPLANT

## 2022-09-14 NOTE — Interval H&P Note (Signed)
History and Physical Interval Note:  09/14/2022 3:29 PM  Tara Campbell  has presented today for surgery, with the diagnosis of nstemi.  The various methods of treatment have been discussed with the patient and family. After consideration of risks, benefits and other options for treatment, the patient has consented to  Procedure(s): LEFT HEART CATH AND CORONARY ANGIOGRAPHY (N/A) as a surgical intervention.  The patient's history has been reviewed, patient examined, no change in status, stable for surgery.  I have reviewed the patient's chart and labs.  Questions were answered to the patient's satisfaction.     Sherren Mocha

## 2022-09-14 NOTE — H&P (View-Only) (Signed)
Rounding Note    Patient Name: Tara Campbell Date of Encounter: 09/14/2022  [place] Cardiologist: Casandra Doffing MD Advanced heart failure- Dr Aundra Dubin  Subjective   Patient states she feels much better today. Chest pain completely resolved. Last night had the worst pain she has ever had and thought she was going to die. Pain lasted 2.5 hours until relieved with IV Fentanyl.   Inpatient Medications    Scheduled Meds:  allopurinol  300 mg Oral BID   aspirin EC  81 mg Oral Daily   atenolol  100 mg Oral Daily   atorvastatin  80 mg Oral QHS   cholecalciferol  25 mcg Oral Q1500   clopidogrel  75 mg Oral Daily   cyanocobalamin  1,000 mcg Oral Daily   ezetimibe  10 mg Oral Daily   irbesartan  150 mg Oral QPM   And   hydrochlorothiazide  6.25 mg Oral QPM   isosorbide mononitrate  120 mg Oral Daily   levothyroxine  125 mcg Oral Q0600   mometasone-formoterol  2 puff Inhalation BID   sertraline  25 mg Oral Daily   sodium chloride flush  3 mL Intravenous Q12H   zinc sulfate  220 mg Oral QHS   Continuous Infusions:  heparin 1,100 Units/hr (09/14/22 0132)   nitroGLYCERIN 40 mcg/min (09/14/22 0323)   PRN Meds: acetaminophen, albuterol, nitroGLYCERIN, ondansetron (ZOFRAN) IV   Vital Signs    Vitals:   09/14/22 0545 09/14/22 0715 09/14/22 0730 09/14/22 0745  BP: 129/79 107/70 (!) 102/59 102/61  Pulse: 91 81 83 80  Resp: (!) '23 18 16 18  ' Temp:      TempSrc:      SpO2: 97% 95% 94% 95%  Weight:      Height:       No intake or output data in the 24 hours ending 09/14/22 0909    09/13/2022    8:52 PM 09/10/2022    3:05 PM 09/08/2022   10:22 PM  Last 3 Weights  Weight (lbs) 182 lb 186 lb 183 lb  Weight (kg) 82.555 kg 84.369 kg 83.008 kg      Telemetry    NSR - Personally Reviewed  ECG    NSR low voltage. No acute ST-T changes.  - Personally Reviewed  Physical Exam   GEN: No acute distress.   Neck: No JVD Cardiac: RRR, no murmurs, rubs, or gallops.   Respiratory: Clear to auscultation bilaterally. GI: Soft, nontender, non-distended  MS: No edema; No deformity. Neuro:  Nonfocal  Psych: Normal affect   Labs    High Sensitivity Troponin:   Recent Labs  Lab 09/09/22 0100 09/13/22 2100 09/13/22 2250 09/14/22 0220 09/14/22 0502  TROPONINIHS 12 50* 402* 1,074* 1,204*     Chemistry Recent Labs  Lab 09/08/22 2244 09/13/22 2100  NA 138 139  K 3.8 3.0*  CL 107 111  CO2 22 18*  GLUCOSE 130* 133*  BUN 12 12  CREATININE 1.00 0.81  CALCIUM 9.5 8.5*  PROT  --  6.1*  ALBUMIN  --  3.4*  AST  --  17  ALT  --  14  ALKPHOS  --  73  BILITOT  --  0.1*  GFRNONAA 58* >60  ANIONGAP 9 10    Lipids  Recent Labs  Lab 09/14/22 0502  CHOL 96  TRIG 144  HDL 49  LDLCALC 18  CHOLHDL 2.0    Hematology Recent Labs  Lab 09/08/22 2244 09/13/22 2100  WBC 11.2*  12.0*  RBC 4.10 3.96  HGB 12.0 11.8*  HCT 35.6* 35.2*  MCV 86.8 88.9  MCH 29.3 29.8  MCHC 33.7 33.5  RDW 13.7 13.8  PLT 350 322   Thyroid No results for input(s): "TSH", "FREET4" in the last 168 hours.  BNPNo results for input(s): "BNP", "PROBNP" in the last 168 hours.  DDimer No results for input(s): "DDIMER" in the last 168 hours.   Radiology    DG Chest Port 1 View  Result Date: 09/13/2022 CLINICAL DATA:  Chest pain. EXAM: PORTABLE CHEST 1 VIEW COMPARISON:  09/08/2022. FINDINGS: The heart size and mediastinal contours are within normal limits. There is atherosclerotic calcification of the aorta. No consolidation, effusion, or pneumothorax. Degenerative changes are present in the thoracic spine. No acute osseous abnormality. IMPRESSION: No active disease. Electronically Signed   By: Brett Fairy M.D.   On: 09/13/2022 21:20    Cardiac Studies   Echo 12/27/21: IMPRESSIONS Left ventricular ejection fraction, by estimation, is 60 to 65%. The left ventricle has normal function. The left ventricle has no regional wall motion abnormalities. Left ventricular diastolic  parameters are consistent with Grade I diastolic dysfunction (impaired relaxation). 1. Right ventricular systolic function is normal. The right ventricular size is normal. Tricuspid regurgitation signal is inadequate for assessing PA pressure. 2. The mitral valve is normal in structure. No evidence of mitral valve regurgitation. No evidence of mitral stenosis. 3. The aortic valve is tricuspid. Aortic valve regurgitation is not visualized. No aortic stenosis is present. 4. The inferior vena cava is normal in size with greater than 50% respiratory variability, suggesting right atrial pressure of 3 mmHg.   Cardiac PET 08/14/22:  Narrative & Impression      This study is high risk due to anterior and anteroseptal ischemia, the presence of TID, a drop in EF with stress and abnormal myocardial blood flow reserve that is most notable in the LAD territory which matches perfusion defect. Recommend cath for further evaluation.   LV perfusion is abnormal. There is evidence of ischemia. Defect 1: There is a medium defect with severe reduction in uptake present in the apical to basal anteroseptal and apex location(s) that is reversible. There is abnormal wall motion in the defect area. Consistent with ischemia. Defect 2: There is a medium defect with moderate reduction in uptake present in the apical to basal anterior location(s) that is reversible. Defect 3: There is a small defect with moderate reduction in uptake present in the apical lateral location(s) that is reversible. There is abnormal wall motion in the defect area. Consistent with ischemia.   Rest left ventricular function is normal. Rest EF: 63 %. EF dropped to 59% with stress. End diastolic cavity size is normal. End systolic cavity size is normal.   Myocardial blood flow was computed to be 1.34m/g/min at rest and 1.676mg/min at stress. Global myocardial blood flow reserve was 1.33 and was abnormal. This was most notable in the LAD territory  consistent with perfusion abnormalities. Recommend cath as above.   Coronary calcium was present on the attenuation correction CT images. Severe coronary calcifications were present. Coronary calcifications were present in the left anterior descending artery, left circumflex artery and right coronary artery distribution(s).   Findings are consistent with ischemia. The study is high risk.   EXAM: OVER-READ INTERPRETATION  PET-CT CHEST   The following report is an over-read performed by radiologist Dr. Er[facility]osine [facility]Va [facility] on 08/14/2022. This over-read does not include interpretation of cardiac or coronary  anatomy or pathology. The cardiac PET interpretation by the cardiologist is to be attached.   COMPARISON:  Chest CT 04/21/2019   FINDINGS: The visualized portions of the lower lung fields show no suspicious nodules, masses, or infiltrates. No pleural fluid seen.   The visualized portions of the mediastinum and chest wall are unremarkable.   IMPRESSION: No acute or clinically significant extracardiac findings.     Electronically Signed   By: Misty Stanley M.D.   On: 08/14/2022 11:07       Cardiac cath/PCI: 08/31/22:  CORONARY STENT INTERVENTION   Conclusion  1.  Severe single-vessel coronary artery disease with calcified complex diffuse stenosis of the proximal, mid, and distal LAD. 2.  Patency of the left main and left circumflex 3.  Previous stenting of the RCA with continued patency of the RCA stent, moderate nonobstructive proximal and distal RCA stenoses of 50% not significantly changed from the previous cath study 4.  Successful PCI of the LAD with stenting of the proximal vessel using a 2.5 x 22 mm Onyx frontier DES and angioplasty of the mid LAD using a 2.0 mm balloon   Recommendations: Same-day PCI protocol if criteria met, aspirin 81 mg daily x30 days, resume apixaban tomorrow, continue clopidogrel x6 months.  If patient tolerates a combination of  apixaban and antiplatelet therapy, would favor keeping her on a low-dose of aspirin long-term due to her extensive CAD.   Coronary Diagrams  Diagnostic Dominance: Right  Intervention   Patient Profile     76 y.o. female with a hx of CAD s/p PCI to LAD on 08-31-22, w/ significant residual dz that will be medically managed, who is being seen 09/13/2022 for the evaluation of CP at the request of Dr Darl House[facility]).  Assessment & Plan    NSTEMI. Patient had severe chest pain associated with elevated troponin to 1200. No acute Ecg changes. Now pain free on IV heparin and Ntg. Had recent PCI with very complex LAD disease. Stented the proximal vessel but also had mid vessel disease that was dilated with 2.0 balloon only. Unable to cross with larger balloon or stent due to angulation and calcification. High risk for restenosis. Will need repeat cardiac cath today and consider interventional options. Discussed with Dr Burt Knack.  HTN/HLD: restart home meds H/o PAF and TIA: currently in NSR. On eliquis for CVA prophylaxis at home. Will hold Eliquis for cath. Last dose was yesterday am according to patient.  DM2: mgmt as per primary medical team     For questions or updates, please contact [place] Please consult [email or web address] for contact info under        Signed, Ryka Beighley [place], MD  09/14/2022, 9:09 AM

## 2022-09-14 NOTE — Assessment & Plan Note (Addendum)
Pt is on repatha in addition to max dose statin it looks like. LDL looked good in Jan, but very elevated in June.  Apparently repatha had lapsed for a short time. 1. Cont Statin and zetia 2. Continue repatha 3. Check FLP

## 2022-09-14 NOTE — Assessment & Plan Note (Addendum)
Severe LAD dz, s/p angioplasty in 2 locations and stent in 1 x13 days ago. CP now improved with fentanyl. But trop continues to trend up sharply, now over 1k.  Question ISR? 1. Heparin gtt 2. NTG GTT 3. NPO until cards decides if she needs / timing of LHC 4. Imdur dose increased to 120mg  daily 5. Cont ASA 6. Pt also getting started on Plavix

## 2022-09-14 NOTE — Progress Notes (Signed)
PROGRESS NOTE  Tara Campbell KPT:[id number] DOB: [birth year]   PCP: Hali Marry, MD  Patient is from: Home.   DOA: 09/13/2022 LOS: 0  Chief complaints Chief Complaint  Patient presents with   Chest Pain     Brief Narrative / Interim history: 76 year old F with PMH of CAD/RCA stent in 2017 and PCI and proximal LAD stent on 08/31/2022, paroxysmal A-fib on Eliquis, NIDDM-2, hypothyroidism, asthma and gout presenting with pressure-like chest pain with radiation to her left arm and associated nausea, vomiting and diaphoresis, and admitted for non-STEMI.  Troponin trended from 50-1200.  EKG without acute ischemic finding.  Cardiology consulted.  Patient was started on heparin and nitro drip.   Subjective: Seen and examined earlier this morning.  No major events overnight of this morning.  No further chest pain since midnight.  Chest pain resolved after IV fentanyl in ED.  Denies dyspnea, palpitation, GI or UTI symptoms.  Objective: Vitals:   09/14/22 0945 09/14/22 1000 09/14/22 1006 09/14/22 1015  BP: 112/64 116/67  (!) 104/59  Pulse: 85 98  95  Resp: 18 (!) 24  16  Temp:   98.2 F (36.8 C)   TempSrc:   Oral   SpO2: 97% 95%  94%  Weight:      Height:        Examination:  GENERAL: No apparent distress.  Nontoxic. HEENT: MMM.  Vision and hearing grossly intact.  NECK: Supple.  No apparent JVD.  RESP:  No IWOB.  Fair aeration bilaterally. CVS:  RRR. Heart sounds normal.  ABD/GI/GU: BS+. Abd soft, NTND.  MSK/EXT:  Moves extremities. No apparent deformity. No edema.  SKIN: no apparent skin lesion or wound NEURO: Awake, alert and oriented appropriately.  No apparent focal neuro deficit. PSYCH: Calm. Normal affect.   Procedures:  None  Microbiology summarized: None  Assessment and plan: Principal Problem:   NSTEMI (non-ST elevated myocardial infarction) ([place]) Active Problems:   Mixed diabetic hyperlipidemia associated with type 2 diabetes mellitus (HCC)    Hypokalemia   Paroxysmal atrial fibrillation (HCC)   Hypertension goal BP (blood pressure) < 140/90   Controlled diabetes mellitus type 2 with complications (HCC)   Acquired hypothyroidism   CAD (coronary artery disease)   Obesity (BMI 30-39.9)  Non-STEMI in patient with history of CAD s/p RCA stent in 2017 and Prox LAD stent on 9/29: Presents with pressure-like chest pain radiating to the left arm with associated diaphoresis and emesis.  No further chest pain since midnight.  Troponin trended from 50-1200.  EKG without acute ischemic finding.  Hemodynamically stable. -Cardiology on board -On IV heparin, nitro drip, Imdur, atenolol, Lipitor, aspirin and Plavix -Plan for repeat catheterization today  Paroxysmal A-fib: In sinus rhythm. -Continue home atenolol -On IV heparin for anticoagulation  Controlled NIDDM-2: A1c 6.0%.  On Ozempic and metformin at home. -Start CBG monitoring and SSI -Continue home statin and Zetia.  Essential hypertension: Normotensive.  Elevated on arrival. -Discontinue HCTZ -Continue atenolol, Imdur, ARB and nitro drip  Hypokalemia: Likely from HCTZ -Discontinue HCTZ -Recheck BMP and Mg this morning  History of asthma: Stable. -Continue home Dulera and as needed albuterol  History of gout -Continue home allopurinol  Hypothyroidism -Continue home Synthroid  Obesity Body mass index is 31.24 kg/m. -On Ozempic.          DVT prophylaxis:    On IV heparin for non-STEMI  Code Status: Full code Family Communication: None at bedside Level of care: Telemetry Cardiac Status is: Observation The  patient will require care spanning > 2 midnights and should be moved to inpatient because: Non-STEMI   Final disposition: Likely home once medically stable Consultants:  Cardiology  Sch Meds:  Scheduled Meds:  allopurinol  300 mg Oral BID   aspirin EC  81 mg Oral Daily   atenolol  100 mg Oral Daily   atorvastatin  80 mg Oral QHS   cholecalciferol  25  mcg Oral Q1500   clopidogrel  75 mg Oral Daily   cyanocobalamin  1,000 mcg Oral Daily   ezetimibe  10 mg Oral Daily   irbesartan  150 mg Oral QPM   isosorbide mononitrate  120 mg Oral Daily   levothyroxine  125 mcg Oral Q0600   mometasone-formoterol  2 puff Inhalation BID   sertraline  25 mg Oral Daily   sodium chloride flush  3 mL Intravenous Q12H   zinc sulfate  220 mg Oral QHS   Continuous Infusions:  sodium chloride     Followed by   sodium chloride     heparin 1,100 Units/hr (09/14/22 0132)   nitroGLYCERIN 40 mcg/min (09/14/22 0323)   PRN Meds:.acetaminophen, albuterol, nitroGLYCERIN, ondansetron (ZOFRAN) IV  Antimicrobials: Anti-infectives (From admission, onward)    None        I have personally reviewed the following labs and images: CBC: Recent Labs  Lab 09/08/22 2244 09/13/22 2100  WBC 11.2* 12.0*  NEUTROABS  --  8.0*  HGB 12.0 11.8*  HCT 35.6* 35.2*  MCV 86.8 88.9  PLT 350 322   BMP &GFR Recent Labs  Lab 09/08/22 2244 09/13/22 2100 09/14/22 1012  NA 138 139  --   K 3.8 3.0*  --   CL 107 111  --   CO2 22 18*  --   GLUCOSE 130* 133*  --   BUN 12 12  --   CREATININE 1.00 0.81  --   CALCIUM 9.5 8.5*  --   MG  --   --  1.9   Estimated Creatinine Clearance: 59.5 mL/min (by C-G formula based on SCr of 0.81 mg/dL). Liver & Pancreas: Recent Labs  Lab 09/13/22 2100  AST 17  ALT 14  ALKPHOS 73  BILITOT 0.1*  PROT 6.1*  ALBUMIN 3.4*   No results for input(s): "LIPASE", "AMYLASE" in the last 168 hours. No results for input(s): "AMMONIA" in the last 168 hours. Diabetic: No results for input(s): "HGBA1C" in the last 72 hours. No results for input(s): "GLUCAP" in the last 168 hours. Cardiac Enzymes: No results for input(s): "CKTOTAL", "CKMB", "CKMBINDEX", "TROPONINI" in the last 168 hours. No results for input(s): "PROBNP" in the last 8760 hours. Coagulation Profile: Recent Labs  Lab 09/13/22 2100  INR 1.1   Thyroid Function Tests: No  results for input(s): "TSH", "T4TOTAL", "FREET4", "T3FREE", "THYROIDAB" in the last 72 hours. Lipid Profile: Recent Labs    09/14/22 0502  CHOL 96  HDL 49  LDLCALC 18  TRIG 144  CHOLHDL 2.0   Anemia Panel: No results for input(s): "VITAMINB12", "FOLATE", "FERRITIN", "TIBC", "IRON", "RETICCTPCT" in the last 72 hours. Urine analysis:    Component Value Date/Time   COLORURINE YELLOW 08/29/2018 1221   APPEARANCEUR CLEAR 08/29/2018 1221   LABSPEC 1.018 08/29/2018 1221   PHURINE 6.0 08/29/2018 1221   GLUCOSEU 1+ (A) 08/29/2018 1221   HGBUR NEGATIVE 08/29/2018 1221   KETONESUR NEGATIVE 08/29/2018 1221   PROTEINUR NEGATIVE 08/29/2018 1221   NITRITE NEGATIVE 08/29/2018 1221   LEUKOCYTESUR NEGATIVE 08/29/2018 1221   Sepsis Labs:  Invalid input(s): "PROCALCITONIN", "LACTICIDVEN"  Microbiology: No results found for this or any previous visit (from the past 240 hour(s)).  Radiology Studies: DG Chest Port 1 View  Result Date: 09/13/2022 CLINICAL DATA:  Chest pain. EXAM: PORTABLE CHEST 1 VIEW COMPARISON:  09/08/2022. FINDINGS: The heart size and mediastinal contours are within normal limits. There is atherosclerotic calcification of the aorta. No consolidation, effusion, or pneumothorax. Degenerative changes are present in the thoracic spine. No acute osseous abnormality. IMPRESSION: No active disease. Electronically Signed   By: Thornell Sartorius M.D.   On: 09/13/2022 21:20      Cebastian Neis T. Tej Murdaugh [facility]  If 7PM-7AM, please contact night-coverage [email or web address] 09/14/2022, 11:28 AM

## 2022-09-14 NOTE — Progress Notes (Signed)
ANTICOAGULATION CONSULT NOTE - Initial Consult  Pharmacy Consult for heparin Indication: chest pain/ACS and atrial fibrillation  Allergies  Allergen Reactions   Sulfamethoxazole Rash    HIVES   Sulfa Antibiotics Rash    Patient Measurements: Height: 5\' 4"  (162.6 cm) Weight: 82.6 kg (182 lb) IBW/kg (Calculated) : 54.7 Heparin Dosing Weight: 75kg  Vital Signs: Temp: 98.1 F (36.7 C) (10/12 2054) Temp Source: Oral (10/12 2054) BP: 111/75 (10/13 0015) Pulse Rate: 96 (10/13 0015)  Labs: Recent Labs    09/13/22 2100 09/13/22 2250  HGB 11.8*  --   HCT 35.2*  --   PLT 322  --   LABPROT 13.9  --   INR 1.1  --   CREATININE 0.81  --   TROPONINIHS 50* 402*    Estimated Creatinine Clearance: 59.5 mL/min (by C-G formula based on SCr of 0.81 mg/dL).   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  Goal of Therapy:  Heparin level 0.3-0.7 units/ml aPTT 66-102 seconds Monitor platelets by anticoagulation protocol: Yes   Plan:  Heparin infusion at 1100 units/hr. Monitor heparin levels, aPTT (while Eliquis affects anti-Xa), and CBC.  Wynona Neat, PharmD, BCPS  09/14/2022,12:54 AM

## 2022-09-14 NOTE — Assessment & Plan Note (Signed)
Cont atenolol On heparin gtt for NSTEMI currently, eliquis held.

## 2022-09-14 NOTE — Assessment & Plan Note (Addendum)
Looks like pt takes Ozempic and metformin at home. 1. Will put patient on sensitive SSI Q4H while here for the moment. 2. Holding metformin incase LHC needed.

## 2022-09-14 NOTE — Progress Notes (Signed)
Rounding Note    Patient Name: Tara Campbell Date of Encounter: 09/14/2022  [address] Cardiologist: Casandra Doffing MD Advanced heart failure- Dr Aundra Dubin  Subjective   Patient states she feels much better today. Chest pain completely resolved. Last night had the worst pain she has ever had and thought she was going to die. Pain lasted 2.5 hours until relieved with IV Fentanyl.   Inpatient Medications    Scheduled Meds:  allopurinol  300 mg Oral BID   aspirin EC  81 mg Oral Daily   atenolol  100 mg Oral Daily   atorvastatin  80 mg Oral QHS   cholecalciferol  25 mcg Oral Q1500   clopidogrel  75 mg Oral Daily   cyanocobalamin  1,000 mcg Oral Daily   ezetimibe  10 mg Oral Daily   irbesartan  150 mg Oral QPM   And   hydrochlorothiazide  6.25 mg Oral QPM   isosorbide mononitrate  120 mg Oral Daily   levothyroxine  125 mcg Oral Q0600   mometasone-formoterol  2 puff Inhalation BID   sertraline  25 mg Oral Daily   sodium chloride flush  3 mL Intravenous Q12H   zinc sulfate  220 mg Oral QHS   Continuous Infusions:  heparin 1,100 Units/hr (09/14/22 0132)   nitroGLYCERIN 40 mcg/min (09/14/22 0323)   PRN Meds: acetaminophen, albuterol, nitroGLYCERIN, ondansetron (ZOFRAN) IV   Vital Signs    Vitals:   09/14/22 0545 09/14/22 0715 09/14/22 0730 09/14/22 0745  BP: 129/79 107/70 (!) 102/59 102/61  Pulse: 91 81 83 80  Resp: (!) '23 18 16 18  ' Temp:      TempSrc:      SpO2: 97% 95% 94% 95%  Weight:      Height:       No intake or output data in the 24 hours ending 09/14/22 0909    09/13/2022    8:52 PM 09/10/2022    3:05 PM 09/08/2022   10:22 PM  Last 3 Weights  Weight (lbs) 182 lb 186 lb 183 lb  Weight (kg) 82.555 kg 84.369 kg 83.008 kg      Telemetry    NSR - Personally Reviewed  ECG    NSR low voltage. No acute ST-T changes.  - Personally Reviewed  Physical Exam   GEN: No acute distress.   Neck: No JVD Cardiac: RRR, no murmurs, rubs, or gallops.   Respiratory: Clear to auscultation bilaterally. GI: Soft, nontender, non-distended  MS: No edema; No deformity. Neuro:  Nonfocal  Psych: Normal affect   Labs    High Sensitivity Troponin:   Recent Labs  Lab 09/09/22 0100 09/13/22 2100 09/13/22 2250 09/14/22 0220 09/14/22 0502  TROPONINIHS 12 50* 402* 1,074* 1,204*     Chemistry Recent Labs  Lab 09/08/22 2244 09/13/22 2100  NA 138 139  K 3.8 3.0*  CL 107 111  CO2 22 18*  GLUCOSE 130* 133*  BUN 12 12  CREATININE 1.00 0.81  CALCIUM 9.5 8.5*  PROT  --  6.1*  ALBUMIN  --  3.4*  AST  --  17  ALT  --  14  ALKPHOS  --  73  BILITOT  --  0.1*  GFRNONAA 58* >60  ANIONGAP 9 10    Lipids  Recent Labs  Lab 09/14/22 0502  CHOL 96  TRIG 144  HDL 49  LDLCALC 18  CHOLHDL 2.0    Hematology Recent Labs  Lab 09/08/22 2244 09/13/22 2100  WBC 11.2*  12.0*  RBC 4.10 3.96  HGB 12.0 11.8*  HCT 35.6* 35.2*  MCV 86.8 88.9  MCH 29.3 29.8  MCHC 33.7 33.5  RDW 13.7 13.8  PLT 350 322   Thyroid No results for input(s): "TSH", "FREET4" in the last 168 hours.  BNPNo results for input(s): "BNP", "PROBNP" in the last 168 hours.  DDimer No results for input(s): "DDIMER" in the last 168 hours.   Radiology    DG Chest Port 1 View  Result Date: 09/13/2022 CLINICAL DATA:  Chest pain. EXAM: PORTABLE CHEST 1 VIEW COMPARISON:  09/08/2022. FINDINGS: The heart size and mediastinal contours are within normal limits. There is atherosclerotic calcification of the aorta. No consolidation, effusion, or pneumothorax. Degenerative changes are present in the thoracic spine. No acute osseous abnormality. IMPRESSION: No active disease. Electronically Signed   By: Brett Fairy M.D.   On: 09/13/2022 21:20    Cardiac Studies   Echo 12/27/21: IMPRESSIONS Left ventricular ejection fraction, by estimation, is 60 to 65%. The left ventricle has normal function. The left ventricle has no regional wall motion abnormalities. Left ventricular diastolic  parameters are consistent with Grade I diastolic dysfunction (impaired relaxation). 1. Right ventricular systolic function is normal. The right ventricular size is normal. Tricuspid regurgitation signal is inadequate for assessing PA pressure. 2. The mitral valve is normal in structure. No evidence of mitral valve regurgitation. No evidence of mitral stenosis. 3. The aortic valve is tricuspid. Aortic valve regurgitation is not visualized. No aortic stenosis is present. 4. The inferior vena cava is normal in size with greater than 50% respiratory variability, suggesting right atrial pressure of 3 mmHg.   Cardiac PET 08/14/22:  Narrative & Impression      This study is high risk due to anterior and anteroseptal ischemia, the presence of TID, a drop in EF with stress and abnormal myocardial blood flow reserve that is most notable in the LAD territory which matches perfusion defect. Recommend cath for further evaluation.   LV perfusion is abnormal. There is evidence of ischemia. Defect 1: There is a medium defect with severe reduction in uptake present in the apical to basal anteroseptal and apex location(s) that is reversible. There is abnormal wall motion in the defect area. Consistent with ischemia. Defect 2: There is a medium defect with moderate reduction in uptake present in the apical to basal anterior location(s) that is reversible. Defect 3: There is a small defect with moderate reduction in uptake present in the apical lateral location(s) that is reversible. There is abnormal wall motion in the defect area. Consistent with ischemia.   Rest left ventricular function is normal. Rest EF: 63 %. EF dropped to 59% with stress. End diastolic cavity size is normal. End systolic cavity size is normal.   Myocardial blood flow was computed to be 1.37m/g/min at rest and 1.637mg/min at stress. Global myocardial blood flow reserve was 1.33 and was abnormal. This was most notable in the LAD territory  consistent with perfusion abnormalities. Recommend cath as above.   Coronary calcium was present on the attenuation correction CT images. Severe coronary calcifications were present. Coronary calcifications were present in the left anterior descending artery, left circumflex artery and right coronary artery distribution(s).   Findings are consistent with ischemia. The study is high risk.   EXAM: OVER-READ INTERPRETATION  PET-CT CHEST   The following report is an over-read performed by radiologist Dr. [facility] on 08/14/2022. This over-read does not include interpretation of cardiac or coronary  anatomy or pathology. The cardiac PET interpretation by the cardiologist is to be attached.   COMPARISON:  Chest CT 04/21/2019   FINDINGS: The visualized portions of the lower lung fields show no suspicious nodules, masses, or infiltrates. No pleural fluid seen.   The visualized portions of the mediastinum and chest wall are unremarkable.   IMPRESSION: No acute or clinically significant extracardiac findings.     Electronically Signed   By: Misty Stanley M.D.   On: 08/14/2022 11:07       Cardiac cath/PCI: 08/31/22:  CORONARY STENT INTERVENTION   Conclusion  1.  Severe single-vessel coronary artery disease with calcified complex diffuse stenosis of the proximal, mid, and distal LAD. 2.  Patency of the left main and left circumflex 3.  Previous stenting of the RCA with continued patency of the RCA stent, moderate nonobstructive proximal and distal RCA stenoses of 50% not significantly changed from the previous cath study 4.  Successful PCI of the LAD with stenting of the proximal vessel using a 2.5 x 22 mm Onyx frontier DES and angioplasty of the mid LAD using a 2.0 mm balloon   Recommendations: Same-day PCI protocol if criteria met, aspirin 81 mg daily x30 days, resume apixaban tomorrow, continue clopidogrel x6 months.  If patient tolerates a combination of  apixaban and antiplatelet therapy, would favor keeping her on a low-dose of aspirin long-term due to her extensive CAD.   Coronary Diagrams  Diagnostic Dominance: Right  Intervention   Patient Profile     76 y.o. female with a hx of CAD s/p PCI to LAD on 08-31-22, w/ significant residual dz that will be medically managed, who is being seen 09/13/2022 for the evaluation of CP at the request of Dr Darl [facility]).  Assessment & Plan    NSTEMI. Patient had severe chest pain associated with elevated troponin to 1200. No acute Ecg changes. Now pain free on IV heparin and Ntg. Had recent PCI with very complex LAD disease. Stented the proximal vessel but also had mid vessel disease that was dilated with 2.0 balloon only. Unable to cross with larger balloon or stent due to angulation and calcification. High risk for restenosis. Will need repeat cardiac cath today and consider interventional options. Discussed with Dr Burt Knack.  HTN/HLD: restart home meds H/o PAF and TIA: currently in NSR. On eliquis for CVA prophylaxis at home. Will hold Eliquis for cath. Last dose was yesterday am according to patient.  DM2: mgmt as per primary medical team     For questions or updates, please contact [place] Please consult [email or web address] for contact info under        Signed, Maddi Collar [place], MD  09/14/2022, 9:09 AM

## 2022-09-14 NOTE — ED Notes (Signed)
Thayer Headings, sister is going home, would like to be called before she goes to the cath lab.  [phone]

## 2022-09-14 NOTE — Assessment & Plan Note (Signed)
Replace K+

## 2022-09-14 NOTE — Assessment & Plan Note (Signed)
Continue atenolol, HCTZ, ARB, nitrate.

## 2022-09-14 NOTE — Progress Notes (Signed)
ANTICOAGULATION CONSULT NOTE - Initial Consult  Pharmacy Consult for heparin Indication: chest pain/ACS and atrial fibrillation  Allergies  Allergen Reactions   Sulfamethoxazole Rash    HIVES   Sulfa Antibiotics Rash    Patient Measurements: Height: 5\' 4"  (162.6 cm) Weight: 82.6 kg (182 lb) IBW/kg (Calculated) : 54.7 Heparin Dosing Weight: 75kg  Vital Signs: Temp: 98.2 F (36.8 C) (10/13 1006) Temp Source: Oral (10/13 1006) BP: 104/59 (10/13 1015) Pulse Rate: 95 (10/13 1015)  Labs: Recent Labs    09/13/22 2100 09/13/22 2250 09/14/22 0220 09/14/22 0502 09/14/22 0900 09/14/22 1012  HGB 11.8*  --   --   --   --   --   HCT 35.2*  --   --   --   --   --   PLT 322  --   --   --   --   --   APTT  --   --   --   --   --  42*  LABPROT 13.9  --   --   --   --   --   INR 1.1  --   --   --   --   --   HEPARINUNFRC  --   --   --   --  0.21*  --   CREATININE 0.81  --   --   --   --  0.96  TROPONINIHS 50*   < > 1,074* 1,204*  --  782*   < > = values in this interval not displayed.     Estimated Creatinine Clearance: 50.2 mL/min (by C-G formula based on SCr of 0.96 mg/dL).   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 Heparin levels correlating with aPTT  Goal of Therapy:  Heparin level 0.3 - 0.7 Monitor  platelets by anticoagulation protocol: Yes   Plan:  Increase heparin infusion to 1250 units/hr Monitor heparin level in 8 hours then daily, H&H daily  Titus Dubin, PharmD PGY1 Pharmacy Resident 09/14/2022 11:41 AM

## 2022-09-14 NOTE — H&P (Signed)
History and Physical    Patient: Tara Campbell MHD:[id number] DOB: 1946/04/24 DOA: 09/13/2022 DOS: the patient was seen and examined on 09/14/2022 PCP: Hali Marry, MD  Patient coming from: Home  Chief Complaint:  Chief Complaint  Patient presents with   Chest Pain   HPI: Tara Campbell is a 76 y.o. female with medical history significant of HTN, HLD, DM2, PAF on eliquis.  CAD with prior ACS, prior stents to RCA previously.  Pt recently had PCI to LAD on 9/29: 2 areas of angioplasty but only able to get a DES into 1 of the 2 areas (other had angioplasty with no stent).  Severe calcific LAD dz, no targets for CABG.  h/o PCI to the RCA as well with ~50% ISR on LHC 08-31-22; LCx has no obstructive dz.  Since the PCI, she has been back to the ED on two separate occasions (including tonight) with recurrent CP. Today, her CP became constant since about 6PM.  Pressure sensation.  4 ASA and 3 NTG and still had chest pressure.  Chest pressure finally improved after fentanyl in ED and NTG GTT.   Review of Systems: As mentioned in the history of present illness. All other systems reviewed and are negative. 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   Otomycosis of right ear 09/27/2017   Paroxysmal atrial fibrillation (HCC)    Thyroid disease    Trochanteric bursitis of right hip 05/29/2017   Past Surgical History:  Procedure Laterality Date   ANGIOPLASTY     CARDIAC CATHETERIZATION     COLONOSCOPY W/ POLYPECTOMY  01/04/2015   CORONARY STENT INTERVENTION N/A 08/31/2022   Procedure: CORONARY STENT INTERVENTION;  Surgeon: Sherren Mocha, MD;  Location: [place] [facility];   Service: Cardiovascular;  Laterality: N/A;   HEMORRHOID BANDING     LEFT HEART CATH AND CORONARY ANGIOGRAPHY N/A 04/26/2021   Procedure: LEFT HEART CATH AND CORONARY ANGIOGRAPHY;  Surgeon: Sherren Mocha, MD;  Location: [place] [facility];  Service: Cardiovascular;  Laterality: N/A;   Social History:  reports that she has never smoked. She has never used smokeless tobacco. She reports current alcohol use. She reports that she does not use drugs.  Allergies  Allergen Reactions   Sulfamethoxazole Rash    HIVES   Sulfa Antibiotics Rash    Family History  Problem Relation Age of Onset   Hypertension Mother    Hyperlipidemia Mother    Alzheimer's disease Mother    Vascular Disease Mother    Glaucoma Father    Heart disease Father    Hypertension Father    Diabetes Father    Hyperlipidemia Father    Skin cancer Father    Diabetes Paternal Grandmother    Colon cancer Neg Hx    Esophageal cancer Neg Hx     Prior to Admission medications   Medication Sig Start Date End Date Taking? Authorizing Provider  acetaminophen (TYLENOL) 650 MG CR tablet Take 1 tablet (650 mg total) by mouth every 8 (eight) hours as needed for pain. 10/02/21  Yes Silverio Decamp, MD  albuterol (VENTOLIN HFA) 108 (90 Base) MCG/ACT inhaler Inhale 2 puffs into the lungs every 6 (six) hours as needed for wheezing. 05/02/22  Yes Parrett, [name] S, NP  allopurinol (ZYLOPRIM) 300 MG tablet Take 1 tablet (300 mg total) by mouth 2 (two) times daily. 04/26/22  Yes Silverio Decamp, MD  apixaban (ELIQUIS) 5 MG TABS tablet Take 1 tablet (5 mg total) by mouth 2 (two) times daily. 04/26/22  Yes Silverio Decamp, MD  aspirin EC 81 MG tablet Take 1 tablet (81 mg total) by mouth daily. Swallow whole. 08/31/22 08/31/23 Yes Bhagat, Bhavinkumar, PA  atenolol (TENORMIN) 100 MG tablet Take 1 tablet (100 mg total) by mouth daily. 04/26/22  Yes Silverio Decamp, MD  atorvastatin (LIPITOR) 80 MG tablet Take 1 tablet (80  mg total) by mouth at bedtime. 04/26/22  Yes Silverio Decamp, MD  budesonide-formoterol [facility]) 80-4.5 MCG/ACT inhaler Inhale 2 puffs into the lungs in the morning and at bedtime. 05/02/22  Yes Parrett, [name] S, NP  clopidogrel (PLAVIX) 75 MG tablet Take 1 tablet (75 mg total) by mouth daily. 08/17/22  Yes Sherren Mocha, MD  cyanocobalamin (VITAMIN B12) 1000 MCG tablet Take 1,000 mcg by mouth daily.   Yes [provider]  Evolocumab with Infusor (Gregory) 420 MG/3.5ML SOCT Inject 420 mg into the skin every 30 (thirty) days. 04/26/22  Yes Silverio Decamp, MD  ezetimibe (ZETIA) 10 MG tablet Take 1 tablet (10 mg total) by mouth daily. 04/26/22  Yes Silverio Decamp, MD  fluticasone (FLONASE) 50 MCG/ACT nasal spray Place 1 spray into both nostrils daily. 04/26/22  Yes Silverio Decamp, MD  ipratropium-albuterol (DUONEB) 0.5-2.5 (3) MG/3ML SOLN Take 3 mLs by nebulization every 6 (six) hours as needed. 05/02/22  Yes Parrett, [name] S, NP  irbesartan-hydrochlorothiazide (AVALIDE) 300-12.5 MG tablet Take 0.5 tablets by mouth daily. Patient taking differently: Take 0.5 tablets by mouth every evening. 04/26/22  Yes Silverio Decamp, MD  isosorbide mononitrate (IMDUR) 60 MG 24 hr tablet Take 1.5 tablets (90 mg total) by mouth daily. Patient taking differently: Take 30-60 mg by mouth See admin instructions. Take 1 tablet by mouth in the morning and 1/2 tablet in the evening 05/10/22  Yes Larey Dresser, MD  levothyroxine (SYNTHROID) 125 MCG tablet Take 1 tablet (125 mcg total) by mouth daily before breakfast. 09/03/22  Yes Hali Marry, MD  Mepolizumab ([name]) 100 MG/ML SOAJ Inject 1 mL (100 mg total) into the skin every 28 (twenty-eight) days. 12/18/21  Yes Mannam, Praveen, MD  metFORMIN (GLUCOPHAGE) 500 MG tablet Take 2 tablets (1,000 mg total) by mouth 2 (two) times daily with a meal. 04/26/22  Yes Silverio Decamp, MD  nitroGLYCERIN  (NITROSTAT) 0.4 MG SL tablet Place 1 tablet (0.4 mg total) under the tongue every 5 (five) minutes as needed for chest pain (or tightness). 05/09/22  Yes Breeback, Jade L, PA-C  Omega-3 Fatty Acids (FISH OIL) 1000 MG CAPS Take 1 capsule (1,000 mg total) by mouth daily. 04/26/22  Yes Silverio Decamp, MD  Polyvinyl Alcohol-Povidone (REFRESH OP) Place 1 drop into both eyes daily as needed (dry eyes).   Yes [provider]  Semaglutide, 1 MG/DOSE, 4 MG/3ML SOPN Inject 1 mg as directed once a week. Patient taking differently: Inject 1 mg as directed once a week. Thursday/Friday 09/10/22  Yes Hali Marry, MD  sertraline (ZOLOFT) 25 MG tablet Take 1 tablet (25 mg total) by mouth daily. 09/10/22  Yes Metheney,  Rene Kocher, MD  Vitamin D, Cholecalciferol, 25 MCG (1000 UT) TABS Take 25 mcg by mouth daily in the afternoon. 12/27/21  Yes [place] Eric J, DO  zinc gluconate 50 MG tablet Take 50 mg by mouth at bedtime.   Yes [provider]  EPINEPHrine 0.3 mg/0.3 mL IJ SOAJ injection Inject 0.3 mg into the muscle as needed for anaphylaxis. Call 9-1-1 after use. Patient not taking: Reported on 09/14/2022 05/02/22   Parrett, Fonnie Mu, NP  glucose blood test strip To be used twice daily for testing blood sugars. E11.65 04/03/21   Hali Marry, MD    Physical Exam: Vitals:   09/14/22 0230 09/14/22 0245 09/14/22 0300 09/14/22 0315  BP: 116/64 115/63 114/60 113/64  Pulse: 88 87 89 88  Resp: 17 18 18 17   Temp:      TempSrc:      SpO2: 95% 96% 94% 94%  Weight:      Height:       Constitutional: NAD, calm, comfortable Eyes: PERRL, lids and conjunctivae normal ENMT: Mucous membranes are moist. Posterior pharynx clear of any exudate or lesions.Normal dentition.  Neck: normal, supple, no masses, no thyromegaly Respiratory: clear to auscultation bilaterally, no wheezing, no crackles. Normal respiratory effort. No accessory muscle use.  Cardiovascular: Regular rate and rhythm, no  murmurs / rubs / gallops. No extremity edema. 2+ pedal pulses. No carotid bruits.  Abdomen: no tenderness, no masses palpated. No hepatosplenomegaly. Bowel sounds positive.  Musculoskeletal: no clubbing / cyanosis. No joint deformity upper and lower extremities. Good ROM, no contractures. Normal muscle tone.  Skin: no rashes, lesions, ulcers. No induration Neurologic: CN 2-12 grossly intact. Sensation intact, DTR normal. Strength 5/5 in all 4.  Psychiatric: Normal judgment and insight. Alert and oriented x 3. Normal mood.   Data Reviewed:    Trops 50 -> 402 -> 1074!     Latest Ref Rng & Units 09/13/2022    9:00 PM 09/08/2022   10:44 PM 08/27/2022   11:11 AM  CMP  Glucose 70 - 99 mg/dL 133  130  125   BUN 8 - 23 mg/dL 12  12  12    Creatinine 0.44 - 1.00 mg/dL 0.81  1.00  1.04   Sodium 135 - 145 mmol/L 139  138  141   Potassium 3.5 - 5.1 mmol/L 3.0  3.8  4.3   Chloride 98 - 111 mmol/L 111  107  103   CO2 22 - 32 mmol/L 18  22  27    Calcium 8.9 - 10.3 mg/dL 8.5  9.5  10.4   Total Protein 6.5 - 8.1 g/dL 6.1     Total Bilirubin 0.3 - 1.2 mg/dL 0.1     Alkaline Phos 38 - 126 U/L 73     AST 15 - 41 U/L 17     ALT 0 - 44 U/L 14         Latest Ref Rng & Units 09/13/2022    9:00 PM 09/08/2022   10:44 PM 08/27/2022   11:11 AM  CBC  WBC 4.0 - 10.5 K/uL 12.0  11.2  9.0   Hemoglobin 12.0 - 15.0 g/dL 11.8  12.0  13.5   Hematocrit 36.0 - 46.0 % 35.2  35.6  40.0   Platelets 150 - 400 K/uL 322  350  290    CXR neg  Assessment and Plan: * NSTEMI (non-ST elevated myocardial infarction) (HCC) Severe LAD dz, s/p angioplasty in 2 locations and stent in 1 x13  days ago. CP now improved with fentanyl. But trop continues to trend up sharply, now over 1k.  Question ISR? Heparin gtt NTG GTT NPO until cards decides if she needs / timing of LHC Imdur dose increased to 120mg  daily Cont ASA Pt also getting started on Plavix  Hypokalemia Replace K  Mixed diabetic hyperlipidemia associated with  type 2 diabetes mellitus ([place]) Pt is on repatha in addition to max dose statin it looks like. LDL looked good in Jan, but very elevated in June.  Apparently repatha had lapsed for a short time. Cont Statin and zetia Continue repatha Check FLP  Controlled diabetes mellitus type 2 with complications ([place]) Looks like pt takes Ozempic and metformin at home. Will put patient on sensitive SSI Q4H while here for the moment. Holding metformin incase LHC needed.  Hypertension goal BP (blood pressure) < 140/90 Continue atenolol, HCTZ, ARB, nitrate.  Paroxysmal atrial fibrillation (HCC) Cont atenolol On heparin gtt for NSTEMI currently, eliquis held.      Advance Care Planning:   Code Status: Full Code  Consults: Cards Dr. Hassell Done  Family Communication: No family in room  Severity of Illness: The appropriate patient status for this patient is OBSERVATION. Observation status is judged to be reasonable and necessary in order to provide the required intensity of service to ensure the patient's safety. The patient's presenting symptoms, physical exam findings, and initial radiographic and laboratory data in the context of their medical condition is felt to place them at decreased risk for further clinical deterioration. Furthermore, it is anticipated that the patient will be medically stable for discharge from the hospital within 2 midnights of admission.   Author: Etta Quill., DO 09/14/2022 3:49 AM  For on call review [email or web address].

## 2022-09-15 LAB — CBC
HCT: 31.2 % — ABNORMAL LOW (ref 36.0–46.0)
Hemoglobin: 10.6 g/dL — ABNORMAL LOW (ref 12.0–15.0)
MCH: 30.2 pg (ref 26.0–34.0)
MCHC: 34 g/dL (ref 30.0–36.0)
MCV: 88.9 fL (ref 80.0–100.0)
Platelets: 308 10*3/uL (ref 150–400)
RBC: 3.51 MIL/uL — ABNORMAL LOW (ref 3.87–5.11)
RDW: 14.1 % (ref 11.5–15.5)
WBC: 8.8 10*3/uL (ref 4.0–10.5)
nRBC: 0 % (ref 0.0–0.2)

## 2022-09-15 LAB — RENAL FUNCTION PANEL
Albumin: 3.1 g/dL — ABNORMAL LOW (ref 3.5–5.0)
Anion gap: 7 (ref 5–15)
BUN: 14 mg/dL (ref 8–23)
CO2: 24 mmol/L (ref 22–32)
Calcium: 9.3 mg/dL (ref 8.9–10.3)
Chloride: 105 mmol/L (ref 98–111)
Creatinine, Ser: 1.06 mg/dL — ABNORMAL HIGH (ref 0.44–1.00)
GFR, Estimated: 54 mL/min — ABNORMAL LOW (ref >60)
Glucose, Bld: 120 mg/dL — ABNORMAL HIGH (ref 70–99)
Phosphorus: 3.1 mg/dL (ref 2.5–4.6)
Potassium: 4.2 mmol/L (ref 3.5–5.1)
Sodium: 136 mmol/L (ref 135–145)

## 2022-09-15 LAB — GLUCOSE, CAPILLARY: Glucose-Capillary: 111 mg/dL — ABNORMAL HIGH (ref 70–99)

## 2022-09-15 LAB — MAGNESIUM: Magnesium: 1.8 mg/dL (ref 1.7–2.4)

## 2022-09-15 LAB — LIPOPROTEIN A (LPA): Lipoprotein (a): 143.5 nmol/L — ABNORMAL HIGH (ref <75.0)

## 2022-09-15 MED ORDER — IRBESARTAN 150 MG PO TABS: 300.0000 mg | ORAL_TABLET | Freq: Every day | ORAL | 3 refills | Status: DC

## 2022-09-15 MED ORDER — IRBESARTAN 300 MG PO TABS: 300.0000 mg | ORAL_TABLET | Freq: Every day | ORAL | 11 refills | Status: DC

## 2022-09-15 MED ORDER — ISOSORBIDE MONONITRATE ER 120 MG PO TB24: 120.0000 mg | ORAL_TABLET | Freq: Every day | ORAL | 1 refills | Status: DC

## 2022-09-15 NOTE — Discharge Summary (Signed)
Physician Discharge Summary  Tara Campbell:[id number] DOB: 02-28-1946 DOA: 09/13/2022  PCP: Hali Marry, MD  Admit date: 09/13/2022 Discharge date: 09/15/2022 Admitted From: Home Disposition: Home Recommendations for Outpatient Follow-up:  Follow up with PCP in 1 week Cardiology to arrange outpatient follow-up. Check BMP and CBC in 1 to 2 weeks Please follow up on the following pending results: None  Home Health: Not indicated Equipment/Devices: Not indicated  Discharge Condition: Stable CODE STATUS: Full code  Follow-up Information     Hali Marry, MD. Schedule an appointment as soon as possible for a visit in 1 week(s).   Specialty: Family Medicine Contact information: [address] [place] [address] [phone]                 Hospital course 76 year old F with PMH of CAD/RCA stent in 2017 and PCI and proximal LAD stent on 08/31/2022, paroxysmal A-fib on Eliquis, NIDDM-2, hypothyroidism, asthma and gout presenting with pressure-like chest pain with radiation to her left arm and associated nausea, vomiting and diaphoresis, and admitted for non-STEMI.  Troponin trended from 50-1200.  EKG without acute ischemic finding.  Cardiology consulted.  Patient was started on heparin and nitro drip.  Patient underwent LHC on 09/14/2022 that did not show new lesion other than what was seen on 08/31/2022.  She remained chest pain-free.  She was cleared for discharge on triple therapy with Eliquis, Plavix and aspirin. See details of left heart catheterization and cardiology recommendation below.  See individual problem list below for more.   Problems addressed during this hospitalization Principal Problem:   NSTEMI (non-ST elevated myocardial infarction) ([place]) Active Problems:   Mixed diabetic hyperlipidemia associated with type 2 diabetes mellitus (HCC)   Hypokalemia   Paroxysmal atrial fibrillation (HCC)   Hypertension goal  BP (blood pressure) < 140/90   Controlled diabetes mellitus type 2 with complications (HCC)   Acquired hypothyroidism   CAD (coronary artery disease)   Obesity (BMI 30-39.9)   Non-STEMI (non-ST elevated myocardial infarction) ([place])   Non-STEMI in patient with history of CAD s/p RCA stent in 2017 and Prox LAD stent on 9/29: Presents with pressure-like chest pain radiating to the left arm with associated diaphoresis and emesis.  No further chest pain since midnight.  Troponin trended from 50-1200.  EKG without acute ischemic finding.  Hemodynamically stable.  LHC with stable CAD (see details below). -Discharged on home Avapro, atenolol, Lipitor, aspirin (at least for 30 days), Plavix (at least for 6 months) and Eliquis per cardiology. -Imdur increased from 90 to 120 mg daily per cardiology. -Discontinued HCTZ due to hypokalemia. -Cardiology to arrange outpatient follow-up.   Paroxysmal A-fib: In sinus rhythm. -Continue home atenolol and Eliquis   Controlled NIDDM-2: A1c 6.0%.  On Ozempic and metformin at home. -Continue home meds.   Essential hypertension: Normotensive.  Elevated on arrival. -Discontinued HCTZ and increased Imdur. -Patient to continue home Avapro and atenolol   Hypokalemia: Likely from HCTZ.  Resolved. -Discontinued HCTZ   History of asthma: Stable. -Continue home Dulera and as needed albuterol   History of gout -Continue home allopurinol   Hypothyroidism -Continue home Synthroid   Obesity Body mass index is 31.24 kg/m. -Continue home Ozempic   Vital signs Vitals:   09/14/22 1800 09/14/22 2013 09/14/22 2020 09/15/22 0519  BP: (!) 123/56  (!) 95/56 (!) 106/43  Pulse: 78  79 69  Temp:   98.9 F (37.2 C) 98.3 F (36.8 C)  Resp:  18 18  Height:      Weight:      SpO2: 97% 96% 96% 94%  TempSrc:   Oral Oral  BMI (Calculated):         Discharge exam  GENERAL: No apparent distress.  Nontoxic. HEENT: MMM.  Vision and hearing grossly intact.  NECK:  Supple.  No apparent JVD.  RESP:  No IWOB.  Fair aeration bilaterally. CVS:  RRR. Heart sounds normal.  ABD/GI/GU: BS+. Abd soft, NTND.  MSK/EXT:  Moves extremities. No apparent deformity. No edema.  SKIN: no apparent skin lesion or wound NEURO: Awake and alert. Oriented appropriately.  No apparent focal neuro deficit. PSYCH: Calm. Normal affect.   Discharge Instructions Discharge Instructions     Call MD for:  extreme fatigue   Complete by: As directed    Call MD for:  persistant dizziness or light-headedness   Complete by: As directed    Call MD for:  severe uncontrolled pain   Complete by: As directed    Diet - low sodium heart healthy   Complete by: As directed    Diet Carb Modified   Complete by: As directed    Discharge instructions   Complete by: As directed    It has been a pleasure taking care of you!  You were hospitalized due to chest pain.  You had heart catheterization that did not show any change.  Your chest pain resolved.  We made some adjustments to your heart medication during this hospitalization.  Please review your new medication list and the directions on your medications before you take them.  Follow-up with your primary care doctor 1 to 2 weeks or sooner if needed.  Follow-up with cardiology per their recommendation.   Take care,   Increase activity slowly   Complete by: As directed       Allergies as of 09/15/2022       Reactions   Sulfamethoxazole Rash   HIVES   Sulfa Antibiotics Rash        Medication List     STOP taking these medications    irbesartan-hydrochlorothiazide 300-12.5 MG tablet Commonly known as: AVALIDE       TAKE these medications    acetaminophen 650 MG CR tablet Commonly known as: TYLENOL Take 1 tablet (650 mg total) by mouth every 8 (eight) hours as needed for pain.   albuterol 108 (90 Base) MCG/ACT inhaler Commonly known as: VENTOLIN HFA Inhale 2 puffs into the lungs every 6 (six) hours as needed for  wheezing.   allopurinol 300 MG tablet Commonly known as: ZYLOPRIM Take 1 tablet (300 mg total) by mouth 2 (two) times daily.   apixaban 5 MG Tabs tablet Commonly known as: ELIQUIS Take 1 tablet (5 mg total) by mouth 2 (two) times daily.   aspirin EC 81 MG tablet Take 1 tablet (81 mg total) by mouth daily. Swallow whole.   atenolol 100 MG tablet Commonly known as: TENORMIN Take 1 tablet (100 mg total) by mouth daily.   atorvastatin 80 MG tablet Commonly known as: LIPITOR Take 1 tablet (80 mg total) by mouth at bedtime.   budesonide-formoterol 80-4.5 MCG/ACT inhaler Commonly known as: Symbicort Inhale 2 puffs into the lungs in the morning and at bedtime.   clopidogrel 75 MG tablet Commonly known as: PLAVIX Take 1 tablet (75 mg total) by mouth daily.   cyanocobalamin 1000 MCG tablet Commonly known as: VITAMIN B12 Take 1,000 mcg by mouth daily.   EPINEPHrine 0.3 mg/0.3 mL  Soaj injection Commonly known as: EPI-PEN Inject 0.3 mg into the muscle as needed for anaphylaxis. Call 9-1-1 after use.   ezetimibe 10 MG tablet Commonly known as: ZETIA Take 1 tablet (10 mg total) by mouth daily.   Fish Oil 1000 MG Caps Take 1 capsule (1,000 mg total) by mouth daily.   fluticasone 50 MCG/ACT nasal spray Commonly known as: FLONASE Place 1 spray into both nostrils daily.   glucose blood test strip To be used twice daily for testing blood sugars. E11.65   ipratropium-albuterol 0.5-2.5 (3) MG/3ML Soln Commonly known as: DUONEB Take 3 mLs by nebulization every 6 (six) hours as needed.   irbesartan 150 MG tablet Commonly known as: Avapro Take 2 tablets (300 mg total) by mouth daily.   isosorbide mononitrate 120 MG 24 hr tablet Commonly known as: IMDUR Take 1 tablet (120 mg total) by mouth daily. Start taking on: September 16, 2022 What changed:  medication strength how much to take   levothyroxine 125 MCG tablet Commonly known as: SYNTHROID Take 1 tablet (125 mcg total) by  mouth daily before breakfast.   metFORMIN 500 MG tablet Commonly known as: GLUCOPHAGE Take 2 tablets (1,000 mg total) by mouth 2 (two) times daily with a meal.   nitroGLYCERIN 0.4 MG SL tablet Commonly known as: Nitrostat Place 1 tablet (0.4 mg total) under the tongue every 5 (five) minutes as needed for chest pain (or tightness).   [name] 100 MG/ML Soaj Generic drug: Mepolizumab Inject 1 mL (100 mg total) into the skin every 28 (twenty-eight) days.   REFRESH OP Place 1 drop into both eyes daily as needed (dry eyes).   Repatha Pushtronex System 420 MG/3.5ML Soct Generic drug: Evolocumab with Infusor Inject 420 mg into the skin every 30 (thirty) days.   Semaglutide (1 MG/DOSE) 4 MG/3ML Sopn Inject 1 mg as directed once a week. What changed: additional instructions   sertraline 25 MG tablet Commonly known as: ZOLOFT Take 1 tablet (25 mg total) by mouth daily.   Vitamin D (Cholecalciferol) 25 MCG (1000 UT) Tabs Take 25 mcg by mouth daily in the afternoon.   zinc gluconate 50 MG tablet Take 50 mg by mouth at bedtime.        Consultations: Cardiology  Procedures/Studies: LEFT HEART CATH AND CORONARY ANGIOGRAPHY on 09/14/2022   Conclusion  1.  Patent left main with no stenosis 2.  Patent circumflex with mild nonobstructive plaquing 3.  Patent RCA with 50% proximal stenosis and a patent stent in the mid vessel, unchanged from the recent procedure 4.  Continued patency of the proximal LAD stent and moderate stenosis of the mid LAD at the previous angioplasty site, area not favorable for PCI due to calcification, angulation, and small vessel caliber.   Recommendations: Medical therapy.  Resume apixaban tomorrow morning.  Continue aspirin for at least another 30 days and clopidogrel for at least 6 months.   CARDIAC CATHETERIZATION  Result Date: 09/14/2022 1.  Patent left main with no stenosis 2.  Patent circumflex with mild nonobstructive plaquing 3.  Patent RCA with  50% proximal stenosis and a patent stent in the mid vessel, unchanged from the recent procedure 4.  Continued patency of the proximal LAD stent and moderate stenosis of the mid LAD at the previous angioplasty site, area not favorable for PCI due to calcification, angulation, and small vessel caliber. Recommendations: Medical therapy.  Resume apixaban tomorrow morning.  Continue aspirin for at least another 30 days and clopidogrel for at least 6  months.   DG Chest Port 1 View  Result Date: 09/13/2022 CLINICAL DATA:  Chest pain. EXAM: PORTABLE CHEST 1 VIEW COMPARISON:  09/08/2022. FINDINGS: The heart size and mediastinal contours are within normal limits. There is atherosclerotic calcification of the aorta. No consolidation, effusion, or pneumothorax. Degenerative changes are present in the thoracic spine. No acute osseous abnormality. IMPRESSION: No active disease. Electronically Signed   By: Brett Fairy M.D.   On: 09/13/2022 21:20   DG Chest 2 View  Result Date: 09/08/2022 CLINICAL DATA:  Left-sided chest pain. EXAM: CHEST - 2 VIEW COMPARISON:  December 26, 2021 FINDINGS: The heart size and mediastinal contours are within normal limits. Both lungs are clear. Multilevel degenerative changes are seen throughout the thoracic spine. IMPRESSION: No active cardiopulmonary disease. Electronically Signed   By: Virgina Norfolk M.D.   On: 09/08/2022 22:37   CARDIAC CATHETERIZATION  Result Date: 08/31/2022 1.  Severe single-vessel coronary artery disease with calcified complex diffuse stenosis of the proximal, mid, and distal LAD. 2.  Patency of the left main and left circumflex 3.  Previous stenting of the RCA with continued patency of the RCA stent, moderate nonobstructive proximal and distal RCA stenoses of 50% not significantly changed from the previous cath study 4.  Successful PCI of the LAD with stenting of the proximal vessel using a 2.5 x 22 mm Onyx frontier DES and angioplasty of the mid LAD using a  2.0 mm balloon Recommendations: Same-day PCI protocol if criteria met, aspirin 81 mg daily x30 days, resume apixaban tomorrow, continue clopidogrel x6 months.  If patient tolerates a combination of apixaban and antiplatelet therapy, would favor keeping her on a low-dose of aspirin long-term due to her extensive CAD.       The results of significant diagnostics from this hospitalization (including imaging, microbiology, ancillary and laboratory) are listed below for reference.     Microbiology: No results found for this or any previous visit (from the past 240 hour(s)).   Labs:  CBC: Recent Labs  Lab 09/08/22 2244 09/13/22 2100 09/15/22 0138  WBC 11.2* 12.0* 8.8  NEUTROABS  --  8.0*  --   HGB 12.0 11.8* 10.6*  HCT 35.6* 35.2* 31.2*  MCV 86.8 88.9 88.9  PLT 350 322 308   BMP &GFR Recent Labs  Lab 09/08/22 2244 09/13/22 2100 09/14/22 1012 09/15/22 0138  NA 138 139 139  139 136  K 3.8 3.0* 4.4  4.4 4.2  CL 107 111 108  106 105  CO2 22 18* 16*  24 24  GLUCOSE 130* 133* 112*  113* 120*  BUN _0 CREATININE 1.00 0.81 0.90  0.96 1.06*  CALCIUM 9.5 8.5* 9.7  9.9 9.3  MG  --   --  1.9 1.8  PHOS  --   --  3.1 3.1   Estimated Creatinine Clearance: 45.5 mL/min (A) (by C-G formula based on SCr of 1.06 mg/dL (H)). Liver & Pancreas: Recent Labs  Lab 09/13/22 2100 09/14/22 1012 09/15/22 0138  AST 17  --   --   ALT 14  --   --   ALKPHOS 73  --   --   BILITOT 0.1*  --   --   PROT 6.1*  --   --   ALBUMIN 3.4* 3.7 3.1*   No results for input(s): "LIPASE", "AMYLASE" in the last 168 hours. No results for input(s): "AMMONIA" in the last 168 hours. Diabetic: No results for input(s): "HGBA1C" in the  last 72 hours. Recent Labs  Lab 09/14/22 1636 09/15/22 0755  GLUCAP 96 111*   Cardiac Enzymes: No results for input(s): "CKTOTAL", "CKMB", "CKMBINDEX", "TROPONINI" in the last 168 hours. No results for input(s): "PROBNP" in the last 8760 hours. Coagulation  Profile: Recent Labs  Lab 09/13/22 2100  INR 1.1   Thyroid Function Tests: No results for input(s): "TSH", "T4TOTAL", "FREET4", "T3FREE", "THYROIDAB" in the last 72 hours. Lipid Profile: Recent Labs    09/14/22 0502  CHOL 96  HDL 49  LDLCALC 18  TRIG 144  CHOLHDL 2.0   Anemia Panel: No results for input(s): "VITAMINB12", "FOLATE", "FERRITIN", "TIBC", "IRON", "RETICCTPCT" in the last 72 hours. Urine analysis:    Component Value Date/Time   COLORURINE YELLOW 08/29/2018 [employer] 08/29/2018 1221   LABSPEC 1.018 08/29/2018 1221   PHURINE 6.0 08/29/2018 1221   GLUCOSEU 1+ (A) 08/29/2018 1221   HGBUR NEGATIVE 08/29/2018 1221   KETONESUR NEGATIVE 08/29/2018 1221   PROTEINUR NEGATIVE 08/29/2018 1221   NITRITE NEGATIVE 08/29/2018 1221   LEUKOCYTESUR NEGATIVE 08/29/2018 1221   Sepsis Labs: Invalid input(s): "PROCALCITONIN", "LACTICIDVEN"   SIGNED:  Mercy Riding, MD  [facility] 09/15/2022, 4:12 PM

## 2022-09-17 ENCOUNTER — Telehealth: Payer: Self-pay

## 2022-09-17 ENCOUNTER — Encounter (HOSPITAL_COMMUNITY): Payer: Self-pay | Admitting: Cardiology

## 2022-09-17 ENCOUNTER — Encounter (HOSPITAL_COMMUNITY): Payer: Self-pay | Admitting: Cardiovascular Disease

## 2022-09-17 LAB — GLUCOSE, CAPILLARY: Glucose-Capillary: 119 mg/dL — ABNORMAL HIGH (ref 70–99)

## 2022-09-17 NOTE — Patient Outreach (Signed)
  Care Coordination TOC Note Transition Care Management Unsuccessful Follow-up Telephone Call  Date of discharge and from where:  [facility] 09/14/[facility]/15/22  Attempts:  1st Attempt  Reason for unsuccessful TCM follow-up call:  Unable to leave message-Voicemail not identified.  Johnney Killian, RN, BSN, CCM Care Management Coordinator [facility] Phone: [phone]: [phone]

## 2022-09-17 NOTE — Telephone Encounter (Signed)
Ang, pls schedule her for next week.  She needs repeat labs.

## 2022-09-17 NOTE — Telephone Encounter (Signed)
-----   Message from Ledora Bottcher, [place] sent at 09/16/2022 10:27 AM EDT ----- Patient discharged after repeat heart catheterization yesterday. Please move her appointment with Jaquelyn Bitter up.  She needs to be seen within the next 10 days.  Would also be a TOC phone call  Thanks  Angie

## 2022-09-18 ENCOUNTER — Other Ambulatory Visit: Payer: Self-pay | Admitting: Family Medicine

## 2022-09-18 ENCOUNTER — Telehealth: Payer: Self-pay

## 2022-09-18 ENCOUNTER — Telehealth: Payer: Self-pay | Admitting: Nurse Practitioner

## 2022-09-18 DIAGNOSIS — I2510 Atherosclerotic heart disease of native coronary artery without angina pectoris: Secondary | ICD-10-CM

## 2022-09-18 DIAGNOSIS — I480 Paroxysmal atrial fibrillation: Secondary | ICD-10-CM

## 2022-09-18 NOTE — Telephone Encounter (Signed)
**Note De-Identified Tara Campbell Obfuscation** Patient contacted regarding discharge from [facility] on 09/15/2022.  Patient understands to follow up with provider Ambrose Pancoast, NP on 09/27/2022 at 10:05 at [address] [place] in [place], [place] [address].  Patient understands discharge instructions? Yes Patient understands medications and regiment? Yes Patient understands to bring all medications to this visit? Yes  Ask patient:  Are you enrolled in My Chart: Yes  The pt reports that she felt tightness in her chest last night and that she placed 1 NTG tablet under her tongue and the tightness went away immediately.  She denies having any radiation of tightness/CP, SOB, nausea, vomiting, diaphoresis, dizziness, or lightheadedness and states that she feels fine now and is without any complaints.  She states that she is staying with her sister who is a cardiac RN and that they do have [facility] phone number and will call if they have any questions.  She thanked me for calling her.

## 2022-09-18 NOTE — Telephone Encounter (Signed)
**Note De-identified Tara Campbell Obfuscation** Patient contacted regarding discharge from [facility] on 09/15/2022.  Patient understands to follow up with provider Ernest Dick, NP on 09/27/2022 at 10:05 at [address] in [place], [place] [address].  Patient understands discharge instructions? Yes Patient understands medications and regiment? Yes Patient understands to bring all medications to this visit? Yes  Ask patient:  Are you enrolled in My Chart: Yes  The pt reports that she felt tightness in her chest last night and that she placed 1 NTG tablet under her tongue and the tightness went away immediately.  She denies having any radiation of tightness/CP, SOB, nausea, vomiting, diaphoresis, dizziness, or lightheadedness and states that she feels fine now and is without any complaints.  She states that she is staying with her sister who is a cardiac RN and that they do have [facility]'s phone number and will call if they have any questions.  She thanked me for calling her.   

## 2022-09-18 NOTE — Patient Outreach (Signed)
  Care Coordination TOC Note Transition Care Management Follow-up Telephone Call Date of discharge and from where: Tara Campbell 09/14/22-09/15/22 How have you been since you were released from the hospital? "I am doing ok, I had a little chest pain last night but I took a nitro and it went away immediately". Any questions or concerns? No  Items Reviewed: Did the pt receive and understand the discharge instructions provided? Yes  Medications obtained and verified? Yes -Discussed with patient she had missed taking her Plavix for a few weeks by accident and she thinks that is why she had an MI.  All meds accounted for now and she is working with her RN Sister who is helping her be sure to get all meds.  Discussed pharmacies which do packaging and that may be helpful for her. Other? No  Any new allergies since your discharge? No  Dietary orders reviewed? Yes Do you have support at home? Yes   Home Care and Equipment/Supplies: Were home health services ordered? no If so, what is the name of the agency? N/A  Has the agency set up a time to come to the patient's home? not applicable Were any new equipment or medical supplies ordered?  No What is the name of the medical supply agency? N/A Were you able to get the supplies/equipment? not applicable Do you have any questions related to the use of the equipment or supplies? No  Functional Questionnaire: (I = Independent and D = Dependent) ADLs: I  Bathing/Dressing- I  Meal Prep- I  Eating- I  Maintaining continence- I  Transferring/Ambulation- I  Managing Meds- Assist from sister  Follow up appointments reviewed:  PCP Hospital f/u appt confirmed? Yes  Scheduled to see Dr. Madilyn Fireman on 09/26/22 @ 10:10. [facility] f/u appt confirmed? Yes  Scheduled to see Dr. Barbarann Ehlers on 09/27/22 @ 10:05. Are transportation arrangements needed? No  If their condition worsens, is the pt aware to call PCP or go to the Emergency Dept.? Yes Was the  patient provided with contact information for the PCP's office or ED? Yes Was to pt encouraged to call back with questions or concerns? Yes  SDOH assessments and interventions completed:   Yes  Care Coordination Interventions Activated:  Yes   Care Coordination Interventions:  No Care Coordination interventions needed at this time.   Encounter Outcome:  Pt. Visit Completed

## 2022-09-18 NOTE — Telephone Encounter (Signed)
Patient scheduled.

## 2022-09-18 NOTE — Telephone Encounter (Signed)
Patient scheduled for TOC on 10/26 at 10:05am with Ambrose Pancoast, NP per Fabian Sharp, PA.

## 2022-09-19 ENCOUNTER — Encounter (HOSPITAL_COMMUNITY): Payer: Self-pay | Admitting: Cardiology

## 2022-09-19 ENCOUNTER — Emergency Department (HOSPITAL_COMMUNITY)
Admission: EM | Admit: 2022-09-19 | Discharge: 2022-09-20 | Disposition: A | Discharge status: Home / Self Care | Payer: PPO | Attending: Emergency Medicine | Admitting: Emergency Medicine

## 2022-09-19 ENCOUNTER — Other Ambulatory Visit: Payer: Self-pay

## 2022-09-19 ENCOUNTER — Encounter (HOSPITAL_COMMUNITY): Payer: Self-pay

## 2022-09-19 ENCOUNTER — Emergency Department (HOSPITAL_COMMUNITY): Payer: PPO

## 2022-09-19 DIAGNOSIS — Z7901 Long term (current) use of anticoagulants: Secondary | ICD-10-CM | POA: Diagnosis not present

## 2022-09-19 DIAGNOSIS — R1110 Vomiting, unspecified: Secondary | ICD-10-CM | POA: Insufficient documentation

## 2022-09-19 DIAGNOSIS — I2089 Other forms of angina pectoris: Secondary | ICD-10-CM | POA: Diagnosis not present

## 2022-09-19 DIAGNOSIS — R079 Chest pain, unspecified: Secondary | ICD-10-CM | POA: Insufficient documentation

## 2022-09-19 DIAGNOSIS — Z7902 Long term (current) use of antithrombotics/antiplatelets: Secondary | ICD-10-CM | POA: Insufficient documentation

## 2022-09-19 DIAGNOSIS — R112 Nausea with vomiting, unspecified: Secondary | ICD-10-CM | POA: Diagnosis not present

## 2022-09-19 DIAGNOSIS — I2510 Atherosclerotic heart disease of native coronary artery without angina pectoris: Secondary | ICD-10-CM | POA: Diagnosis not present

## 2022-09-19 DIAGNOSIS — Z7982 Long term (current) use of aspirin: Secondary | ICD-10-CM | POA: Insufficient documentation

## 2022-09-19 DIAGNOSIS — R0789 Other chest pain: Secondary | ICD-10-CM | POA: Diagnosis not present

## 2022-09-19 LAB — I-STAT CHEM 8, ED
BUN: 14 mg/dL (ref 8–23)
Calcium, Ion: 1.31 mmol/L (ref 1.15–1.40)
Chloride: 104 mmol/L (ref 98–111)
Creatinine, Ser: 1 mg/dL (ref 0.44–1.00)
Glucose, Bld: 83 mg/dL (ref 70–99)
HCT: 35 % — ABNORMAL LOW (ref 36.0–46.0)
Hemoglobin: 11.9 g/dL — ABNORMAL LOW (ref 12.0–15.0)
Potassium: 4.5 mmol/L (ref 3.5–5.1)
Sodium: 138 mmol/L (ref 135–145)
TCO2: 24 mmol/L (ref 22–32)

## 2022-09-19 LAB — BASIC METABOLIC PANEL
Anion gap: 11 (ref 5–15)
BUN: 13 mg/dL (ref 8–23)
CO2: 23 mmol/L (ref 22–32)
Calcium: 10.6 mg/dL — ABNORMAL HIGH (ref 8.9–10.3)
Chloride: 104 mmol/L (ref 98–111)
Creatinine, Ser: 1.05 mg/dL — ABNORMAL HIGH (ref 0.44–1.00)
GFR, Estimated: 54 mL/min — ABNORMAL LOW (ref >60)
Glucose, Bld: 87 mg/dL (ref 70–99)
Potassium: 4.5 mmol/L (ref 3.5–5.1)
Sodium: 138 mmol/L (ref 135–145)

## 2022-09-19 LAB — CBC
HCT: 34.8 % — ABNORMAL LOW (ref 36.0–46.0)
Hemoglobin: 11.9 g/dL — ABNORMAL LOW (ref 12.0–15.0)
MCH: 30.3 pg (ref 26.0–34.0)
MCHC: 34.2 g/dL (ref 30.0–36.0)
MCV: 88.5 fL (ref 80.0–100.0)
Platelets: 328 10*3/uL (ref 150–400)
RBC: 3.93 MIL/uL (ref 3.87–5.11)
RDW: 14.3 % (ref 11.5–15.5)
WBC: 8.7 10*3/uL (ref 4.0–10.5)
nRBC: 0 % (ref 0.0–0.2)

## 2022-09-19 LAB — TROPONIN I (HIGH SENSITIVITY): Troponin I (High Sensitivity): 42 ng/L — ABNORMAL HIGH (ref <18)

## 2022-09-19 MED ORDER — ONDANSETRON 4 MG PO TBDP
4.0000 mg | ORAL_TABLET | Freq: Once | ORAL | Status: AC
  Administered 2022-09-19: 4 mg via ORAL
  Filled 2022-09-19: qty 1

## 2022-09-19 NOTE — ED Notes (Signed)
Pt threw up on the floor, triage RN notified.

## 2022-09-19 NOTE — ED Triage Notes (Signed)
Had ste\nt placed 2 weeks ago and then was here again on Friday with a heart attack per patient but unable to stent anything else. Patient reports chest pain and vomiting.  Wose pain in chest with vomiting.  Denies radiation. Denies SOB

## 2022-09-19 NOTE — ED Provider Triage Note (Signed)
Emergency Medicine Provider Triage Evaluation Note  Tara Campbell , a 76 y.o. female  was evaluated in triage.  Pt complains of worsening chest pain with vomiting.  Cardiac stent placed 2 weeks ago.  Here again on Friday with MI per patient, however unable to have occlusion stented.  Now complaining of worse chest pain with N/V starting around 3 PM-woke her out of her sleep.  Chest pain nonradiating and of the lower left chest, without accompanying shortness of breath.  Denies LOC or lightheadedness.  Vomiting described as yellow/bilious.  Review of Systems  Positive:  Negative: See above  Physical Exam  BP (!) 162/75 (BP Location: Right Arm)   Pulse (!) 103   Temp 97.9 F (36.6 C) (Oral)   Resp 18   Ht 5\' 4"  (1.626 m)   Wt 82.6 kg   SpO2 99%   BMI 31.24 kg/m  Gen:   Awake, no distress, appears uncomfortable Resp:  Normal effort, CTAB, equal chest rise MSK:   Moves extremities without difficulty  Other:  Chest non-TTP.  Abdomen soft, protuberant, nontender.  Medical Decision Making  Medically screening exam initiated at 5:29 PM.  Appropriate orders placed.  Tara Campbell was informed that the remainder of the evaluation will be completed by another provider, this initial triage assessment does not replace that evaluation, and the importance of remaining in the ED until their evaluation is complete.  Cardiac work-up initiated.  Concern for ACS.  Discussed with triage nurse, patient to come back to next available room soon as possible.   Prince Rome, PA-C 90/30/09 1737

## 2022-09-19 NOTE — ED Notes (Signed)
EKG done, not crossing over

## 2022-09-19 NOTE — ED Notes (Signed)
Pt is in xray at this time 

## 2022-09-20 ENCOUNTER — Telehealth: Payer: Self-pay | Admitting: Pharmacist Clinician (PhC)/ Clinical Pharmacy Specialist

## 2022-09-20 ENCOUNTER — Telehealth: Payer: Self-pay | Admitting: Neurology

## 2022-09-20 DIAGNOSIS — I2089 Other forms of angina pectoris: Secondary | ICD-10-CM

## 2022-09-20 LAB — TROPONIN I (HIGH SENSITIVITY): Troponin I (High Sensitivity): 46 ng/L — ABNORMAL HIGH (ref <18)

## 2022-09-20 MED ORDER — EMPAGLIFLOZIN 10 MG PO TABS: 10.0000 mg | ORAL_TABLET | Freq: Every day | ORAL | 5 refills | Status: DC

## 2022-09-20 MED ORDER — RANOLAZINE ER 500 MG PO TB12: 500.0000 mg | ORAL_TABLET | Freq: Two times a day (BID) | ORAL | 5 refills | Status: DC

## 2022-09-20 MED ORDER — RANOLAZINE ER 500 MG PO TB12
500.0000 mg | ORAL_TABLET | Freq: Two times a day (BID) | ORAL | Status: DC
  Administered 2022-09-20: 500 mg via ORAL
  Filled 2022-09-20: qty 1

## 2022-09-20 NOTE — ED Provider Notes (Addendum)
[facility] EMERGENCY DEPARTMENT Provider Note   CSN: [id number] Arrival date & time: 09/19/22  1658     History  Chief Complaint  Patient presents with   Chest Pain   Emesis    Tara Campbell is a 76 y.o. female.  Patient here with chest pain.  History of CAD status post stents.  Had a stent placed last month and had another heart catheterization last week after having elevated heart enzymes.  She has been chest pain-free since last week but has had 1 day of short chest discomfort earlier this week and then about 8 to 10 hours of chest discomfort yesterday afternoon.  She has been chest pain-free since around midnight last night so for the last 12 hours.  She took nitroglycerin which helped.  Her last heart cath they did not place a stent.  Patient denies any shortness of breath, ongoing chest pain or abdominal pain or nausea or vomiting.  They thought that she had her heart issue as she did not take her Plavix after taking her last stent.  She is currently on Plavix, aspirin, Eliquis.  The history is provided by the patient.       Home Medications Prior to Admission medications   Medication Sig Start Date End Date Taking? Authorizing Provider  acetaminophen (TYLENOL) 650 MG CR tablet Take 1 tablet (650 mg total) by mouth every 8 (eight) hours as needed for pain. 10/02/21  Yes Monica Becton, MD  albuterol (VENTOLIN HFA) 108 (90 Base) MCG/ACT inhaler Inhale 2 puffs into the lungs every 6 (six) hours as needed for wheezing. 05/02/22  Yes Parrett, [name] S, NP  allopurinol (ZYLOPRIM) 300 MG tablet Take 1 tablet (300 mg total) by mouth 2 (two) times daily. 04/26/22  Yes Monica Becton, MD  apixaban (ELIQUIS) 5 MG TABS tablet Take 1 tablet (5 mg total) by mouth 2 (two) times daily. 04/26/22  Yes Monica Becton, MD  aspirin EC 81 MG tablet Take 1 tablet (81 mg total) by mouth daily. Swallow whole. 08/31/22 08/31/23  Bhagat, Sharrell Ku, PA  atenolol  (TENORMIN) 100 MG tablet Take 1 tablet (100 mg total) by mouth daily. 09/19/22   Agapito Games, MD  atorvastatin (LIPITOR) 80 MG tablet Take 1 tablet (80 mg total) by mouth at bedtime. 04/26/22   Monica Becton, MD  budesonide-formoterol (SYMBICORT) 80-4.5 MCG/ACT inhaler Inhale 2 puffs into the lungs in the morning and at bedtime. 05/02/22   Parrett, Virgel Bouquet, NP  clopidogrel (PLAVIX) 75 MG tablet Take 1 tablet (75 mg total) by mouth daily. 08/17/22   Tonny Bollman, MD  cyanocobalamin (VITAMIN B12) 1000 MCG tablet Take 1,000 mcg by mouth daily.    [provider]  EPINEPHrine 0.3 mg/0.3 mL IJ SOAJ injection Inject 0.3 mg into the muscle as needed for anaphylaxis. Call 9-1-1 after use. Patient not taking: Reported on 09/14/2022 05/02/22   Parrett, Virgel Bouquet, NP  Evolocumab with Infusor (REPATHA PUSHTRONEX SYSTEM) 420 MG/3.5ML SOCT Inject 420 mg into the skin every 30 (thirty) days. 04/26/22   Monica Becton, MD  ezetimibe (ZETIA) 10 MG tablet Take 1 tablet (10 mg total) by mouth daily. 04/26/22   Monica Becton, MD  fluticasone (FLONASE) 50 MCG/ACT nasal spray Place 1 spray into both nostrils daily. 04/26/22   Monica Becton, MD  glucose blood test strip To be used twice daily for testing blood sugars. E11.65 04/03/21   Agapito Games, MD  ipratropium-albuterol (DUONEB)  0.5-2.5 (3) MG/3ML SOLN Take 3 mLs by nebulization every 6 (six) hours as needed. 05/02/22   Parrett, Fonnie Mu, NP  irbesartan (AVAPRO) 150 MG tablet Take 2 tablets (300 mg total) by mouth daily. 09/15/22 03/14/23  Mercy Riding, MD  isosorbide mononitrate (IMDUR) 120 MG 24 hr tablet Take 1 tablet (120 mg total) by mouth daily. 09/16/22   Mercy Riding, MD  levothyroxine (SYNTHROID) 125 MCG tablet Take 1 tablet (125 mcg total) by mouth daily before breakfast. 09/03/22   Hali Marry, MD  Mepolizumab ([name]) 100 MG/ML SOAJ Inject 1 mL (100 mg total) into the skin every 28  (twenty-eight) days. 12/18/21   Mannam, Hart Robinsons, MD  metFORMIN (GLUCOPHAGE) 500 MG tablet Take 2 tablets (1,000 mg total) by mouth 2 (two) times daily with a meal. 04/26/22   Silverio Decamp, MD  nitroGLYCERIN (NITROSTAT) 0.4 MG SL tablet Place 1 tablet (0.4 mg total) under the tongue every 5 (five) minutes as needed for chest pain (or tightness). 09/19/22   Hali Marry, MD  Omega-3 Fatty Acids (FISH OIL) 1000 MG CAPS Take 1 capsule (1,000 mg total) by mouth daily. 04/26/22   Silverio Decamp, MD  Polyvinyl Alcohol-Povidone (REFRESH OP) Place 1 drop into both eyes daily as needed (dry eyes).    [provider]  Semaglutide, 1 MG/DOSE, 4 MG/3ML SOPN Inject 1 mg as directed once a week. Patient taking differently: Inject 1 mg as directed once a week. Thursday/Friday 09/10/22   Hali Marry, MD  sertraline (ZOLOFT) 25 MG tablet Take 1 tablet (25 mg total) by mouth daily. 09/10/22   Hali Marry, MD  Vitamin D, Cholecalciferol, 25 MCG (1000 UT) TABS Take 25 mcg by mouth daily in the afternoon. 12/27/21   [place] Donnamarie Poag, DO  zinc gluconate 50 MG tablet Take 50 mg by mouth at bedtime.    [provider]      Allergies    Sulfamethoxazole, Sulfa antibiotics, and Tape    Review of Systems   Review of Systems  Physical Exam Updated Vital Signs  ED Triage Vitals  Enc Vitals Group     BP 09/19/22 1715 (!) 162/75     Pulse Rate 09/19/22 1715 (!) 103     Resp 09/19/22 1715 18     Temp 09/19/22 1715 97.9 F (36.6 C)     Temp Source 09/19/22 1715 Oral     SpO2 09/19/22 1715 99 %     Weight 09/19/22 1716 182 lb (82.6 kg)     Height 09/19/22 1716 5\' 4"  (1.626 m)     Head Circumference --      Peak Flow --      Pain Score 09/19/22 1715 4     Pain Loc --      Pain Edu? --      Excl. in [place]? --     Physical Exam Vitals and nursing note reviewed.  Constitutional:      General: She is not in acute distress.    Appearance: She is  well-developed. She is not ill-appearing.  HENT:     Head: Normocephalic and atraumatic.  Eyes:     Extraocular Movements: Extraocular movements intact.     Conjunctiva/sclera: Conjunctivae normal.     Pupils: Pupils are equal, round, and reactive to light.  Cardiovascular:     Rate and Rhythm: Normal rate and regular rhythm.     Pulses:  Radial pulses are 2+ on the right side and 2+ on the left side.     Heart sounds: Normal heart sounds. No murmur heard. Pulmonary:     Effort: Pulmonary effort is normal. No respiratory distress.     Breath sounds: Normal breath sounds.  Abdominal:     Palpations: Abdomen is soft.     Tenderness: There is no abdominal tenderness.  Musculoskeletal:        General: No swelling.     Cervical back: Normal range of motion and neck supple.     Right lower leg: No edema.     Left lower leg: No edema.  Skin:    General: Skin is warm and dry.     Capillary Refill: Capillary refill takes less than 2 seconds.  Neurological:     Mental Status: She is alert.  Psychiatric:        Mood and Affect: Mood normal.     ED Results / Procedures / Treatments   Labs (all labs ordered are listed, but only abnormal results are displayed) Labs Reviewed  BASIC METABOLIC PANEL - Abnormal; Notable for the following components:      Result Value   Creatinine, Ser 1.05 (*)    Calcium 10.6 (*)    GFR, Estimated 54 (*)    All other components within normal limits  CBC - Abnormal; Notable for the following components:   Hemoglobin 11.9 (*)    HCT 34.8 (*)    All other components within normal limits  I-STAT CHEM 8, ED - Abnormal; Notable for the following components:   Hemoglobin 11.9 (*)    HCT 35.0 (*)    All other components within normal limits  TROPONIN I (HIGH SENSITIVITY) - Abnormal; Notable for the following components:   Troponin I (High Sensitivity) 42 (*)    All other components within normal limits  TROPONIN I (HIGH SENSITIVITY) - Abnormal;  Notable for the following components:   Troponin I (High Sensitivity) 46 (*)    All other components within normal limits    EKG EKG Interpretation  Date/Time:  Wednesday September 19 2022 17:12:07 EDT Ventricular Rate:  103 PR Interval:  224 QRS Duration: 82 QT Interval:  328 QTC Calculation: 429 R Axis:   123 Text Interpretation: Sinus tachycardia with 1st degree A-V block Low voltage QRS Lateral infarct , age undetermined Abnormal ECG SINCE LAST TRACING HEART RATE HAS INCREASED Confirmed by Elayne Snare ([id number]) on 09/20/2022 9:34:50 AM  Radiology DG Chest 2 View  Result Date: 09/19/2022 CLINICAL DATA:  Chest pain nausea vomiting EXAM: CHEST - 2 VIEW COMPARISON:  Chest 09/13/2022 FINDINGS: The heart size and mediastinal contours are within normal limits. Both lungs are clear. The visualized skeletal structures are unremarkable. IMPRESSION: No active cardiopulmonary disease. Electronically Signed   By: Marlan Palau M.D.   On: 09/19/2022 18:58    Procedures Procedures    Medications Ordered in ED Medications  ranolazine (RANEXA) 12 hr tablet 500 mg (has no administration in time range)  ondansetron (ZOFRAN-ODT) disintegrating tablet 4 mg (4 mg Oral Given 09/19/22 2255)    ED Course/ Medical Decision Making/ A&P                           Medical Decision Making  ARAINA BUTRICK is here with chest pain.  Normal vitals.  No fever.  Has history of CAD status post stents.  History of hypertension, high cholesterol, diabetes, paroxysmal  A-fib.  She is currently on Plavix, aspirin, Eliquis.  She has had 2 heart catheterizations this month.  She had a stent placed last month.  She had another heart attack and had another heart cath but did not have a stent placed.  They thought may be because she was not taking her Plavix this is why this happened.  She went home last week.  She has some chest pain on Monday couple days ago that was brief and short lasting and got better with  nitro.  She has been chest pain-free until yesterday afternoon where she has some low-level chest discomfort for about 10 hours.  Got better with nitroglycerin and rest.  She has been without pain for the last 12 hours while she has been waiting.  She has no infectious symptoms.  Differential diagnosis is ongoing anginal type pain/ACS.  I have much lower concern for pneumonia.  Have no concern for dissection or PE given history and physical.  Will evaluate with CBC, troponin, BNP, chest x-ray.  EKG per my review and interpretation shows sinus rhythm.  No ischemic changes.  Unchanged from prior EKGs.  Per my review and interpretation of labs, troponin unremarkable at 42 and 46.  Per my further review and interpretation of labs is no significant leukocytosis or anemia or electrolyte abnormality.  Chest x-ray per my review and interpretation shows no obvious pneumonia or pneumothorax.  I have consulted cardiology to see if they want to make any medication adjustments or bring her in for further work-up.  Suspect this could be anginal type discomfort.  She has been chest pain-free now.  Her vital signs are normal.  Her blood pressure is normal.    Patient evaluated by cardiology.  We will start her on Ranexa and follow-up closely outpatient.  She understands return precautions.  Discharged in good condition.  This chart was dictated using voice recognition software.  Despite best efforts to proofread,  errors can occur which can change the documentation meaning.         Final Clinical Impression(s) / ED Diagnoses Final diagnoses:  Chest pain, unspecified type    Rx / DC Orders ED Discharge Orders     None         Virgina Norfolk, DO 09/20/22 1507    Virgina Norfolk, DO 09/20/22 1543

## 2022-09-20 NOTE — Telephone Encounter (Signed)
Repatha renewal PA sent to [facility] 10/19  Key: [id number]

## 2022-09-20 NOTE — Consult Note (Addendum)
Advanced Heart Failure Team Consult Note   Primary Physician: Hali Marry, MD PCP-Cardiologist:  None  Reason for Consultation: Angina  HPI:    Tara Campbell is seen today for evaluation of angina at the request of Lennice Sites, emergency medicine.   Tara Campbell is a 76 y.o. with a history of CAD, PAF on eliquis, Asthma, HTN, NSTEMI, gout, hypothyroid, and DMII. Had previous PCI to RCA.    Admitted 04/2021 with NSTEMI. Cath showed severe complex stenosis of her LAD in the proximal mid and distal portion of the vessel. Patent left circumflex with no significant stenosis, patent left main with no significant stenosis, patent RCA with RCA stents mild nonobstructive disease less than 50% stenosis and normal LVEDP. Her LAD stenosis was unfavorable for PCI due to heavy calcification and marked tortuosity. There is also noted to be a small caliber vessel with likely poor targets for CABG.  Medical therapy was recommended.   Plavix stopped in 9/22 due to nose bleeds.    Admitted 1/23 with ?TIA => neurology evaluated, imaging showed no CVA.  Echo in 1/23 showed EF 60-65%, normal RV, IVC normal.  cardiac PET stress test 08/14/22 : features consistent with anterior and anterior septal ischemia. Underwent same day PCI 9/29. Successful PCI of LAD w/ stent.  Recently discharged 10/14 for NSTEMI. Underwent LHC, no new findings from previous cath 08/31/22. D/c'd on triple therapy; eliquis, plavix, ASA.   Presented w/ complaints of chest pain. Had been chest pain free for about a week. Started to have chest pain yesterday afternoon. No relief with Nitro x2. Had previously had brief chest pain Monday and it was relieved with SLNT. CP didn't worsen with activity. Felt similar to previous MIs but much less intense. Described as pressure on the L side of her chest. Ultimately self resolved during wait in ER. HsTrop 42>46. EKG ST with 1st degree A-V block 103 bpm. Denies SOB. + emesis x1.   On  assessment, asymptomatic. No CP. Dr. Aundra Dubin discussed w / Dr. Burt Knack and felt safe to d/c w/ medical intervention. No further intervention able to be done based on recent cath. EKG and Hstrop stable. Angina resolved.   Cardiac studies reviewed:  LHC 09/14/22:  Patent left main with no stenosis, Patent circumflex with mild nonobstructive plaquing, Patent RCA with 50% proximal stenosis and a patent stent in the mid vessel, unchanged from the recent procedure, Continued patency of the proximal LAD stent and moderate stenosis of the mid LAD at the previous angioplasty site, area not favorable for PCI due to calcification, angulation, and small vessel caliber.  9/23 stent intervention:  Severe single-vessel coronary artery disease with calcified complex diffuse stenosis of the proximal, mid, and distal LAD. Patency of the left main and left circumflex. Previous stenting of the RCA with continued patency of the RCA stent, moderate nonobstructive proximal and distal RCA stenoses of 50% not significantly changed from the previous cath study. Successful PCI of the LAD with stenting of the proximal vessel using a 2.5 x 22 mm Onyx frontier DES and angioplasty of the mid LAD using a 2.0 mm balloon  Echo 12/27/21: EF 60-65%, GIDD, no MR.  Review of Systems: [y] = yes, [ ]  = no   General: Weight gain [ ] ; Weight loss [ ] ; Anorexia [ ] ; Fatigue [ ] ; Fever [ ] ; Chills [ ] ; Weakness [ ]   Cardiac: Chest pain/pressure [ Y]; Resting SOB [ ] ; Exertional SOB [ ] ; Orthopnea [ ] ;  Pedal Edema [ ] ; Palpitations [ ] ; Syncope [ ] ; Presyncope [ ] ; Paroxysmal nocturnal dyspnea[ ]   Pulmonary: Cough [ ] ; Wheezing[ ] ; Hemoptysis[ ] ; Sputum [ ] ; Snoring [ ]   GI: Vomiting[Y ]; Dysphagia[ ] ; Melena[ ] ; Hematochezia [ ] ; Heartburn[ ] ; Abdominal pain [ ] ; Constipation [ ] ; Diarrhea [ ] ; BRBPR [ ]   GU: Hematuria[ ] ; Dysuria [ ] ; Nocturia[ ]   Vascular: Pain in legs with walking [ ] ; Pain in feet with lying flat [ ] ; Non-healing sores [ ] ;  Stroke [ ] ; TIA [ ] ; Slurred speech [ ] ;  Neuro: Headaches[ ] ; Vertigo[ ] ; Seizures[ ] ; Paresthesias[ ] ;Blurred vision [ ] ; Diplopia [ ] ; Vision changes [ ]   Ortho/Skin: Arthritis [ ] ; Joint pain [ ] ; Muscle pain [ ] ; Joint swelling [ ] ; Back Pain [ ] ; Rash [ ]   Psych: Depression[ ] ; Anxiety[ ]   Heme: Bleeding problems [ ] ; Clotting disorders [ ] ; Anemia [ ]   Endocrine: Diabetes [ ] ; Thyroid dysfunction[ ]   Home Medications Prior to Admission medications   Medication Sig Start Date End Date Taking? Authorizing Provider  acetaminophen (TYLENOL) 650 MG CR tablet Take 1 tablet (650 mg total) by mouth every 8 (eight) hours as needed for pain. 10/02/21   Silverio Decamp, MD  albuterol (VENTOLIN HFA) 108 (90 Base) MCG/ACT inhaler Inhale 2 puffs into the lungs every 6 (six) hours as needed for wheezing. 05/02/22   Parrett, Fonnie Mu, NP  allopurinol (ZYLOPRIM) 300 MG tablet Take 1 tablet (300 mg total) by mouth 2 (two) times daily. 04/26/22   Silverio Decamp, MD  apixaban (ELIQUIS) 5 MG TABS tablet Take 1 tablet (5 mg total) by mouth 2 (two) times daily. 04/26/22   Silverio Decamp, MD  aspirin EC 81 MG tablet Take 1 tablet (81 mg total) by mouth daily. Swallow whole. 08/31/22 08/31/23  Bhagat, Crista Luria, PA  atenolol (TENORMIN) 100 MG tablet Take 1 tablet (100 mg total) by mouth daily. 09/19/22   Hali Marry, MD  atorvastatin (LIPITOR) 80 MG tablet Take 1 tablet (80 mg total) by mouth at bedtime. 04/26/22   Silverio Decamp, MD  budesonide-formoterol (SYMBICORT) 80-4.5 MCG/ACT inhaler Inhale 2 puffs into the lungs in the morning and at bedtime. 05/02/22   Parrett, Fonnie Mu, NP  clopidogrel (PLAVIX) 75 MG tablet Take 1 tablet (75 mg total) by mouth daily. 08/17/22   Sherren Mocha, MD  cyanocobalamin (VITAMIN B12) 1000 MCG tablet Take 1,000 mcg by mouth daily.    [provider]  EPINEPHrine 0.3 mg/0.3 mL IJ SOAJ injection Inject 0.3 mg into the muscle as needed for  anaphylaxis. Call 9-1-1 after use. Patient not taking: Reported on 09/14/2022 05/02/22   Parrett, Fonnie Mu, NP  Evolocumab with Infusor ([place]) 420 MG/3.5ML SOCT Inject 420 mg into the skin every 30 (thirty) days. 04/26/22   Silverio Decamp, MD  ezetimibe (ZETIA) 10 MG tablet Take 1 tablet (10 mg total) by mouth daily. 04/26/22   Silverio Decamp, MD  fluticasone (FLONASE) 50 MCG/ACT nasal spray Place 1 spray into both nostrils daily. 04/26/22   Silverio Decamp, MD  glucose blood test strip To be used twice daily for testing blood sugars. E11.65 04/03/21   Hali Marry, MD  ipratropium-albuterol (DUONEB) 0.5-2.5 (3) MG/3ML SOLN Take 3 mLs by nebulization every 6 (six) hours as needed. 05/02/22   Parrett, Fonnie Mu, NP  irbesartan (AVAPRO) 150 MG tablet Take 2 tablets (300 mg total) by mouth daily.  09/15/22 03/14/23  Mercy Riding, MD  isosorbide mononitrate (IMDUR) 120 MG 24 hr tablet Take 1 tablet (120 mg total) by mouth daily. 09/16/22   Mercy Riding, MD  levothyroxine (SYNTHROID) 125 MCG tablet Take 1 tablet (125 mcg total) by mouth daily before breakfast. 09/03/22   Hali Marry, MD  Mepolizumab ([name]) 100 MG/ML SOAJ Inject 1 mL (100 mg total) into the skin every 28 (twenty-eight) days. 12/18/21   Mannam, Hart Robinsons, MD  metFORMIN (GLUCOPHAGE) 500 MG tablet Take 2 tablets (1,000 mg total) by mouth 2 (two) times daily with a meal. 04/26/22   Silverio Decamp, MD  nitroGLYCERIN (NITROSTAT) 0.4 MG SL tablet Place 1 tablet (0.4 mg total) under the tongue every 5 (five) minutes as needed for chest pain (or tightness). 09/19/22   Hali Marry, MD  Omega-3 Fatty Acids (FISH OIL) 1000 MG CAPS Take 1 capsule (1,000 mg total) by mouth daily. 04/26/22   Silverio Decamp, MD  Polyvinyl Alcohol-Povidone (REFRESH OP) Place 1 drop into both eyes daily as needed (dry eyes).    [provider]  Semaglutide, 1 MG/DOSE, 4 MG/3ML SOPN Inject 1  mg as directed once a week. Patient taking differently: Inject 1 mg as directed once a week. Thursday/Friday 09/10/22   Hali Marry, MD  sertraline (ZOLOFT) 25 MG tablet Take 1 tablet (25 mg total) by mouth daily. 09/10/22   Hali Marry, MD  Vitamin D, Cholecalciferol, 25 MCG (1000 UT) TABS Take 25 mcg by mouth daily in the afternoon. 12/27/21   [place] Donnamarie Poag, DO  zinc gluconate 50 MG tablet Take 50 mg by mouth at bedtime.    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   Otomycosis of right ear 09/27/2017   Paroxysmal atrial fibrillation ([facility])    Thyroid disease    Trochanteric bursitis of right hip 05/29/2017    Past Surgical History: Past Surgical History:  Procedure Laterality Date   ANGIOPLASTY     CARDIAC CATHETERIZATION     COLONOSCOPY W/ POLYPECTOMY  01/04/2015   CORONARY STENT INTERVENTION N/A 08/31/2022   Procedure: CORONARY STENT INTERVENTION;  Surgeon: Sherren Mocha, MD;  Location: [facility];  Service: Cardiovascular;  Laterality: N/A;   HEMORRHOID BANDING     LEFT HEART CATH AND CORONARY ANGIOGRAPHY N/A 04/26/2021   Procedure: LEFT HEART CATH AND CORONARY ANGIOGRAPHY;  Surgeon: Sherren Mocha, MD;  Location: [facility];  Service: Cardiovascular;  Laterality: N/A;   LEFT HEART CATH AND CORONARY ANGIOGRAPHY N/A 09/14/2022   Procedure: LEFT HEART CATH AND CORONARY ANGIOGRAPHY;  Surgeon: Sherren Mocha, MD;  Location: [facility];  Service: Cardiovascular;  Laterality: N/A;    Family History: Family History  Problem Relation Age of Onset   Hypertension Mother    Hyperlipidemia  Mother    Alzheimer's disease Mother    Vascular Disease Mother    Glaucoma Father    Heart disease Father    Hypertension Father    Diabetes Father  Hyperlipidemia Father    Skin cancer Father    Diabetes Paternal Grandmother    Colon cancer Neg Hx    Esophageal cancer Neg Hx     Social History: Social History   Socioeconomic History   Marital status: Divorced    Spouse name: Not on file   Number of children: 2   Years of education: 13.5   Highest education level: 12th grade  Occupational History    Comment: retired  Tobacco Use   Smoking status: Never   Smokeless tobacco: Never  Vaping Use   Vaping Use: Never used  Substance and Sexual Activity   Alcohol use: Yes    Comment: Rare   Drug use: No   Sexual activity: Not Currently  Other Topics Concern   Not on file  Social History Narrative   Lives alone. Likes to crafts and paint in her free time.      Right Handed    Lives in a one story home    Social Determinants of Health   Financial Resource Strain: Low Risk  (04/06/2021)   Overall Financial Resource Strain (CARDIA)    Difficulty of Paying Living Expenses: Not hard at all  Food Insecurity: No Food Insecurity (04/06/2021)   Hunger Vital Sign    Worried About Running Out of Food in the Last Year: Never true    Ran Out of Food in the Last Year: Never true  Transportation Needs: No Transportation Needs (04/06/2021)   PRAPARE - Hydrologist (Medical): No    Lack of Transportation (Non-Medical): No  Physical Activity: Inactive (04/06/2021)   Exercise Vital Sign    Days of Exercise per Week: 0 days    Minutes of Exercise per Session: 0 min  Stress: No Stress Concern Present (04/06/2021)   [place]    Feeling of Stress : Not at all  Social Connections: Moderately Isolated (04/06/2021)   Social Connection and Isolation Panel [NHANES]    Frequency of Communication with  Friends and Family: More than three times a week    Frequency of Social Gatherings with Friends and Family: More than three times a week    Attends Religious Services: Never    Marine scientist or Organizations: Yes    Attends Music therapist: More than 4 times per year    Marital Status: Divorced    Allergies:  Allergies  Allergen Reactions   Sulfamethoxazole Rash    HIVES   Sulfa Antibiotics Rash    Objective:    Vital Signs:   Temp:  [97.9 F (36.6 C)-98.4 F (36.9 C)] 98 F (36.7 C) (10/19 1254) Pulse Rate:  [93-104] 100 (10/19 1400) Resp:  [15-19] 16 (10/19 1400) BP: (120-163)/(68-112) 120/70 (10/19 1400) SpO2:  [94 %-99 %] 94 % (10/19 1400) Weight:  [82.6 kg] 82.6 kg (10/18 1716)    Weight change: Filed Weights   09/19/22 1716  Weight: 82.6 kg    Intake/Output:  No intake or output data in the 24 hours ending 09/20/22 1441    Physical Exam  General:  well appearing.  No respiratory difficulty HEENT: normal Neck: supple. No JVD. Carotids 2+ bilat; no bruits. No lymphadenopathy or thyromegaly appreciated. Cor: PMI nondisplaced. Regular rate & rhythm. No rubs, gallops or murmurs. Lungs: clear Abdomen: soft, nontender, nondistended. No hepatosplenomegaly. No bruits or masses. Good bowel sounds. Extremities: no cyanosis, clubbing, rash, edema  Neuro: alert & oriented x  3, cranial nerves grossly intact. moves all 4 extremities w/o difficulty. Affect pleasant.   Telemetry   NST 80s-low 100s (Personally reviewed)    EKG    Sinus tachycardia with 1st degree A-V block 103 bpm Low voltage QRS Lateral infarct , age undetermined  Labs   Basic Metabolic Panel: Recent Labs  Lab 09/13/22 2100 09/14/22 1012 09/15/22 0138 09/19/22 1749 09/19/22 1814  NA 139 139  139 136 138 138  K 3.0* 4.4  4.4 4.2 4.5 4.5  CL 111 108  106 105 104 104  CO2 18* 16*  24 24 23   --   GLUCOSE 133* 112*  113* 120* 87 83  BUN 12 13  13 14 13 14    CREATININE 0.81 0.90  0.96 1.06* 1.05* 1.00  CALCIUM 8.5* 9.7  9.9 9.3 10.6*  --   MG  --  1.9 1.8  --   --   PHOS  --  3.1 3.1  --   --     Liver Function Tests: Recent Labs  Lab 09/13/22 2100 09/14/22 1012 09/15/22 0138  AST 17  --   --   ALT 14  --   --   ALKPHOS 73  --   --   BILITOT 0.1*  --   --   PROT 6.1*  --   --   ALBUMIN 3.4* 3.7 3.1*   No results for input(s): "LIPASE", "AMYLASE" in the last 168 hours. No results for input(s): "AMMONIA" in the last 168 hours.  CBC: Recent Labs  Lab 09/13/22 2100 09/15/22 0138 09/19/22 1749 09/19/22 1814  WBC 12.0* 8.8 8.7  --   NEUTROABS 8.0*  --   --   --   HGB 11.8* 10.6* 11.9* 11.9*  HCT 35.2* 31.2* 34.8* 35.0*  MCV 88.9 88.9 88.5  --   PLT 322 308 328  --     Cardiac Enzymes: No results for input(s): "CKTOTAL", "CKMB", "CKMBINDEX", "TROPONINI" in the last 168 hours.  BNP: BNP (last 3 results) No results for input(s): "BNP" in the last 8760 hours.  ProBNP (last 3 results) No results for input(s): "PROBNP" in the last 8760 hours.   CBG: Recent Labs  Lab 09/14/22 1636 09/14/22 2058 09/15/22 0755  GLUCAP 96 119* 111*    Coagulation Studies: No results for input(s): "LABPROT", "INR" in the last 72 hours.   Imaging   DG Chest 2 View  Result Date: 09/19/2022 CLINICAL DATA:  Chest pain nausea vomiting EXAM: CHEST - 2 VIEW COMPARISON:  Chest 09/13/2022 FINDINGS: The heart size and mediastinal contours are within normal limits. Both lungs are clear. The visualized skeletal structures are unremarkable. IMPRESSION: No active cardiopulmonary disease. Electronically Signed   By: Franchot Gallo M.D.   On: 09/19/2022 18:58     Medications:     Current Medications:   Infusions:     Patient Profile   Tara Campbell is a 76 y.o. with a history of CAD, PAF, Asthma, HTN, DMII. Had previous PCI to RCA.  Assessment/Plan   1. Chest pain w/ CAD: Remote RCA PCI.  NSTEMI in 5/22, LHC showed severe complex  stenosis proximal/mid/distal LAD with patent RCA stents.  LAD was unfavorable for PCI with marked tortuosity and small caliber, also poor target for CABG due to small size. Medical management. Tara Campbell has a history of chronic stable angina. cardiac PET stress test 08/14/22: consistent with anterior and anterior septal ischemia. Underwent same day PCI 9/29. Successful PCI of LAD w/ stent. 10/14 NSTEMI:  LHC no new findings d/c'd on triple therapy; eliquis, plavix, ASA.  - Admitted with angina, now resolved, HsTrop 42>46. EKG ST w/ 1st degree AV block - After discussion with Dr. Burt Knack, not able to do any intervention since it's such a distal lesion, decision made to medically manage. - Start Ranexa 500 mg BID - Continue ASA 81 for now with previous angina and extensive LAD disease, despite also being on apixaban.  - Continue atorvastatin 80 mg daily + Zetia.   - Continue Repatha - On irbesartan 300 mg  - Restart Jardiance 10 mg daily - Continue atenolol 100 mg daily. If dizziness returns, discussed taking qhs. - Continue Imdur 120 mg daily. (Currently takes 60 mg qam/60 mg qpm 2/2 hypotension)  2. Atrial fibrillation: Paroxysmal.  NSR on exam today. - Continue Eliquis. No bleeding issues.  - Continue atenolol  3. HTN: BP controlled.   4. Diabetes: on semaglutide now.    Length of Stay: [place], AGACNP-BC  09/20/2022, 2:41 PM  Advanced Heart Failure Team Pager [phone] (M-F; 7a - 5p)  Please contact [facility] for night-coverage after hours (4p -7a ) and weekends on [email or web address]   Patient seen with NP, agree with the above note.   Patient developed pain under her left breast yesterday with nausea/vomiting.  The pain was persistent for several hours and Tara Campbell came to the ER.    Of note, Tara Campbell was admitted earlier in 10/23 with NSTEMI, LHC showed patent proximal LAD stent (from 9/23).  Tara Campbell had a moderate stenosis in the mid LAD at prior angioplasty site that was not amenable to PCI due  to angulation and small caliber vessel.  HS-TnI peaked at 1200.    This admission, HS-TnI was 42 => 46.  Initial ECG showed high lateral changes, but suspect arm lead reversal.  ECG today showed no worrisome changes.    Currently, chest pain-free.    General: NAD Neck: No JVD, no thyromegaly or thyroid nodule.  Lungs: Clear to auscultation bilaterally with normal respiratory effort. CV: Nondisplaced PMI.  Heart regular S1/S2, no S3/S4, no murmur.  No peripheral edema.  No carotid bruit.  Normal pedal pulses.  Abdomen: Soft, nontender, no hepatosplenomegaly, no distention.  Skin: Intact without lesions or rashes.  Neurologic: Alert and oriented x 3.  Psych: Normal affect. Extremities: No clubbing or cyanosis.  HEENT: Normal.   With prolonged chest pain and HS-TnI mildly elevated and trending down from recent NSTEMI, I suspect that her pain was noncardiac.  ECG today with no worrisome abnormalities.  - I think Tara Campbell can go home today.  - I am going to start her on ranolazine for angina.  Tara Campbell will continue Imdur and atenolol.  - Tara Campbell should continue ASA 81 for 1 month post-stent.  Tara Campbell should continue Plavix for a year if possible.  Tara Campbell will remain on apixaban.  - Continue Repatha.  - I would like her to restart her Jardiance, unsure why this was stopped  Tara Campbell is in NSR today, symptoms of atrial fibrillation.   Close followup in office.   Loralie Champagne 09/20/2022 5:08 PM

## 2022-09-20 NOTE — Discharge Instructions (Addendum)
Your cardiology team is starting you on ranexa to help with your care. Take as prescribed. Follow up outpatient with cardiology who will arrange for close follow-up.  Return if symptoms worsen.

## 2022-09-20 NOTE — Telephone Encounter (Signed)
Patient's sister left a vm on triage phone line yesterday at 3:41 pm. She stated that patient has had a history of heart attacks, the last one was last week and she was having chest pressure. They were asking if patient could come here for an EKG or if she needed to go to the ER.   By the time I got this message, it appears patient is currently admitted in the hospital. Dr. Madilyn Fireman - FYI.

## 2022-09-20 NOTE — ED Notes (Signed)
All discharge instructions including follow up care and prescriptions reviewed with patient and patient verbalized understanding of same. Patient stable and ambulatory at time of discharge.  

## 2022-09-22 ENCOUNTER — Emergency Department (HOSPITAL_BASED_OUTPATIENT_CLINIC_OR_DEPARTMENT_OTHER)
Admission: EM | Admit: 2022-09-22 | Discharge: 2022-09-23 | Disposition: A | Discharge status: Home / Self Care | Payer: PPO | Attending: Emergency Medicine | Admitting: Emergency Medicine

## 2022-09-22 ENCOUNTER — Encounter (HOSPITAL_BASED_OUTPATIENT_CLINIC_OR_DEPARTMENT_OTHER): Payer: Self-pay

## 2022-09-22 ENCOUNTER — Other Ambulatory Visit: Payer: Self-pay

## 2022-09-22 DIAGNOSIS — J45909 Unspecified asthma, uncomplicated: Secondary | ICD-10-CM | POA: Diagnosis not present

## 2022-09-22 DIAGNOSIS — Z7902 Long term (current) use of antithrombotics/antiplatelets: Secondary | ICD-10-CM | POA: Insufficient documentation

## 2022-09-22 DIAGNOSIS — Z7984 Long term (current) use of oral hypoglycemic drugs: Secondary | ICD-10-CM | POA: Insufficient documentation

## 2022-09-22 DIAGNOSIS — R7989 Other specified abnormal findings of blood chemistry: Secondary | ICD-10-CM | POA: Insufficient documentation

## 2022-09-22 DIAGNOSIS — Z7982 Long term (current) use of aspirin: Secondary | ICD-10-CM | POA: Insufficient documentation

## 2022-09-22 DIAGNOSIS — E119 Type 2 diabetes mellitus without complications: Secondary | ICD-10-CM | POA: Insufficient documentation

## 2022-09-22 DIAGNOSIS — R112 Nausea with vomiting, unspecified: Secondary | ICD-10-CM | POA: Insufficient documentation

## 2022-09-22 DIAGNOSIS — I2510 Atherosclerotic heart disease of native coronary artery without angina pectoris: Secondary | ICD-10-CM | POA: Insufficient documentation

## 2022-09-22 DIAGNOSIS — Z7901 Long term (current) use of anticoagulants: Secondary | ICD-10-CM | POA: Diagnosis not present

## 2022-09-22 DIAGNOSIS — Z7951 Long term (current) use of inhaled steroids: Secondary | ICD-10-CM | POA: Diagnosis not present

## 2022-09-22 DIAGNOSIS — R0789 Other chest pain: Secondary | ICD-10-CM | POA: Insufficient documentation

## 2022-09-22 DIAGNOSIS — I10 Essential (primary) hypertension: Secondary | ICD-10-CM | POA: Insufficient documentation

## 2022-09-22 DIAGNOSIS — T50905A Adverse effect of unspecified drugs, medicaments and biological substances, initial encounter: Secondary | ICD-10-CM

## 2022-09-22 DIAGNOSIS — Z79899 Other long term (current) drug therapy: Secondary | ICD-10-CM | POA: Diagnosis not present

## 2022-09-22 LAB — CBC WITH DIFFERENTIAL/PLATELET
Abs Immature Granulocytes: 0.04 10*3/uL (ref 0.00–0.07)
Basophils Absolute: 0.1 10*3/uL (ref 0.0–0.1)
Basophils Relative: 1 %
Eosinophils Absolute: 0.1 10*3/uL (ref 0.0–0.5)
Eosinophils Relative: 1 %
HCT: 36.8 % (ref 36.0–46.0)
Hemoglobin: 12.7 g/dL (ref 12.0–15.0)
Immature Granulocytes: 0 %
Lymphocytes Relative: 14 %
Lymphs Abs: 1.5 10*3/uL (ref 0.7–4.0)
MCH: 30.1 pg (ref 26.0–34.0)
MCHC: 34.5 g/dL (ref 30.0–36.0)
MCV: 87.2 fL (ref 80.0–100.0)
Monocytes Absolute: 0.7 10*3/uL (ref 0.1–1.0)
Monocytes Relative: 6 %
Neutro Abs: 8 10*3/uL — ABNORMAL HIGH (ref 1.7–7.7)
Neutrophils Relative %: 78 %
Platelets: 396 10*3/uL (ref 150–400)
RBC: 4.22 MIL/uL (ref 3.87–5.11)
RDW: 14.3 % (ref 11.5–15.5)
WBC: 10.2 10*3/uL (ref 4.0–10.5)
nRBC: 0 % (ref 0.0–0.2)

## 2022-09-22 LAB — COMPREHENSIVE METABOLIC PANEL
ALT: 14 U/L (ref 0–44)
AST: 23 U/L (ref 15–41)
Albumin: 4.3 g/dL (ref 3.5–5.0)
Alkaline Phosphatase: 88 U/L (ref 38–126)
Anion gap: 12 (ref 5–15)
BUN: 25 mg/dL — ABNORMAL HIGH (ref 8–23)
CO2: 23 mmol/L (ref 22–32)
Calcium: 10.2 mg/dL (ref 8.9–10.3)
Chloride: 102 mmol/L (ref 98–111)
Creatinine, Ser: 1.5 mg/dL — ABNORMAL HIGH (ref 0.44–1.00)
GFR, Estimated: 35 mL/min — ABNORMAL LOW (ref >60)
Glucose, Bld: 82 mg/dL (ref 70–99)
Potassium: 4.2 mmol/L (ref 3.5–5.1)
Sodium: 137 mmol/L (ref 135–145)
Total Bilirubin: 1 mg/dL (ref 0.3–1.2)
Total Protein: 7.6 g/dL (ref 6.5–8.1)

## 2022-09-22 LAB — CBG MONITORING, ED: Glucose-Capillary: 73 mg/dL (ref 70–99)

## 2022-09-22 LAB — LIPASE, BLOOD: Lipase: 33 U/L (ref 11–51)

## 2022-09-22 MED ORDER — SODIUM CHLORIDE 0.9 % IV BOLUS
500.0000 mL | Freq: Once | INTRAVENOUS | Status: AC
  Administered 2022-09-22: 500 mL via INTRAVENOUS

## 2022-09-22 MED ORDER — ONDANSETRON HCL 4 MG/2ML IJ SOLN
4.0000 mg | Freq: Once | INTRAMUSCULAR | Status: AC
  Administered 2022-09-22: 4 mg via INTRAVENOUS
  Filled 2022-09-22: qty 2

## 2022-09-22 NOTE — ED Notes (Signed)
Unable to obtain a urine sample, will ask for one again when placed in the exam room

## 2022-09-22 NOTE — ED Triage Notes (Addendum)
Nausea and vomiting x 3 days. Recently seen for chest pains and evaluated by cardiologist. Took morning dose of metformin but not evening dose due to nausea.   Stents placed 2 weeks. Denying any current cp. Taking Ozempic.pharmacist thinks may be related.

## 2022-09-23 LAB — TROPONIN I (HIGH SENSITIVITY)
Troponin I (High Sensitivity): 30 ng/L — ABNORMAL HIGH (ref <18)
Troponin I (High Sensitivity): 33 ng/L — ABNORMAL HIGH (ref <18)

## 2022-09-23 MED ORDER — ONDANSETRON 8 MG PO TBDP: ORAL_TABLET | ORAL | 0 refills | Status: DC

## 2022-09-23 NOTE — ED Provider Notes (Signed)
[facility] Provider Note   CSN: [id number] Arrival date & time: 09/22/22  2053     History  Chief Complaint  Patient presents with   Vomiting    Tara Campbell is a 76 y.o. female.  The history is provided by the patient.  Emesis Severity:  Moderate Duration:  1 week Timing:  Intermittent Quality:  Stomach contents Progression:  Unchanged Chronicity:  New Recent urination:  Normal Context: not post-tussive   Relieved by:  Nothing Worsened by:  Nothing Ineffective treatments:  None tried Associated symptoms: no abdominal pain, no cough, no diarrhea and no fever   Associated symptoms comment:  No chest pain no SOB.  Has recently started Ozempic  Risk factors: no travel to endemic areas   Patient with CAD and DM recently restarted on Ozempic presents with nausea and emesis x 1 week.    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 Authorizing Provider  acetaminophen (TYLENOL) 650 MG CR tablet Take 1 tablet (650 mg total) by mouth every 8 (eight) hours as needed for pain. 10/02/21   Silverio Decamp, MD  albuterol (VENTOLIN HFA) 108 (90 Base) MCG/ACT inhaler Inhale 2 puffs into the lungs every 6 (six) hours as needed for wheezing. 05/02/22   Parrett, Fonnie Mu, NP  allopurinol (ZYLOPRIM) 300 MG tablet Take 1 tablet (300 mg total) by mouth 2 (two) times daily. 04/26/22   Silverio Decamp, MD   apixaban (ELIQUIS) 5 MG TABS tablet Take 1 tablet (5 mg total) by mouth 2 (two) times daily. 04/26/22   Silverio Decamp, MD  aspirin EC 81 MG tablet Take 1 tablet (81 mg total) by mouth daily. Swallow whole. 08/31/22 08/31/23  Bhagat, Crista Luria, PA  atenolol (TENORMIN) 100 MG tablet Take 1 tablet (100 mg total) by mouth daily. 09/19/22   Hali Marry, MD  atorvastatin (LIPITOR) 80 MG tablet Take 1 tablet (80 mg total) by mouth at bedtime. 04/26/22   Silverio Decamp, MD  budesonide-formoterol (SYMBICORT) 80-4.5 MCG/ACT inhaler Inhale 2 puffs into the lungs in the morning and at bedtime. 05/02/22   Parrett, Fonnie Mu, NP  clopidogrel (PLAVIX) 75 MG tablet Take 1 tablet (75 mg total) by mouth daily. 08/17/22   Sherren Mocha, MD  cyanocobalamin (VITAMIN B12) 1000 MCG tablet Take 1,000 mcg by mouth daily.    [provider]  empagliflozin (JARDIANCE) 10 MG TABS tablet Take 1 tablet (10 mg total) by mouth daily before breakfast. 09/20/22   Earnie Larsson, NP  EPINEPHrine 0.3 mg/0.3 mL IJ SOAJ injection Inject 0.3 mg into the muscle as needed for anaphylaxis. Call 9-1-1 after use. Patient not taking: Reported on 09/14/2022 05/02/22   Parrett, Fonnie Mu, NP  Evolocumab with Infusor ([place]) 420 MG/3.5ML SOCT Inject 420  mg into the skin every 30 (thirty) days. 04/26/22   Silverio Decamp, MD  ezetimibe (ZETIA) 10 MG tablet Take 1 tablet (10 mg total) by mouth daily. 04/26/22   Silverio Decamp, MD  fluticasone (FLONASE) 50 MCG/ACT nasal spray Place 1 spray into both nostrils daily. 04/26/22   Silverio Decamp, MD  glucose blood test strip To be used twice daily for testing blood sugars. E11.65 04/03/21   Hali Marry, MD  ipratropium-albuterol (DUONEB) 0.5-2.5 (3) MG/3ML SOLN Take 3 mLs by nebulization every 6 (six) hours as needed. 05/02/22   Parrett, Fonnie Mu, NP  irbesartan (AVAPRO) 150 MG tablet Take 2 tablets (300 mg total) by mouth daily.  09/15/22 03/14/23  Mercy Riding, MD  isosorbide mononitrate (IMDUR) 120 MG 24 hr tablet Take 1 tablet (120 mg total) by mouth daily. 09/16/22   Mercy Riding, MD  levothyroxine (SYNTHROID) 125 MCG tablet Take 1 tablet (125 mcg total) by mouth daily before breakfast. 09/03/22   Hali Marry, MD  Mepolizumab ([name]) 100 MG/ML SOAJ Inject 1 mL (100 mg total) into the skin every 28 (twenty-eight) days. 12/18/21   Mannam, Hart Robinsons, MD  metFORMIN (GLUCOPHAGE) 500 MG tablet Take 2 tablets (1,000 mg total) by mouth 2 (two) times daily with a meal. 04/26/22   Silverio Decamp, MD  nitroGLYCERIN (NITROSTAT) 0.4 MG SL tablet Place 1 tablet (0.4 mg total) under the tongue every 5 (five) minutes as needed for chest pain (or tightness). 09/19/22   Hali Marry, MD  Omega-3 Fatty Acids (FISH OIL) 1000 MG CAPS Take 1 capsule (1,000 mg total) by mouth daily. 04/26/22   Silverio Decamp, MD  Polyvinyl Alcohol-Povidone (REFRESH OP) Place 1 drop into both eyes daily as needed (dry eyes).    [provider]  ranolazine (RANEXA) 500 MG 12 hr tablet Take 1 tablet (500 mg total) by mouth 2 (two) times daily. 09/21/22   Earnie Larsson, NP  Semaglutide, 1 MG/DOSE, 4 MG/3ML SOPN Inject 1 mg as directed once a week. Patient taking differently: Inject 1 mg as directed once a week. Thursday/Friday 09/10/22   Hali Marry, MD  sertraline (ZOLOFT) 25 MG tablet Take 1 tablet (25 mg total) by mouth daily. 09/10/22   Hali Marry, MD  Vitamin D, Cholecalciferol, 25 MCG (1000 UT) TABS Take 25 mcg by mouth daily in the afternoon. 12/27/21   [place] Donnamarie Poag, DO  zinc gluconate 50 MG tablet Take 50 mg by mouth at bedtime.    [provider]      Allergies    Sulfamethoxazole, Sulfa antibiotics, and Tape    Review of Systems   Review of Systems  Constitutional:  Negative for fever.  HENT:  Negative for facial swelling.   Eyes:  Negative for redness.  Respiratory:  Negative  for cough.   Gastrointestinal:  Positive for vomiting. Negative for abdominal pain and diarrhea.  Neurological:  Negative for facial asymmetry.  All other systems reviewed and are negative.   Physical Exam Updated Vital Signs BP (!) 141/79   Pulse 96   Temp 98.1 F (36.7 C) (Oral)   Resp 15   Ht 5' 4" (1.626 m)   Wt 81.6 kg   SpO2 97%   BMI 30.90 kg/m  Physical Exam Vitals and nursing note reviewed.  Constitutional:      General: She is not in acute distress.    Appearance: Normal appearance. She is well-developed.  HENT:  Head: Normocephalic and atraumatic.     Nose: Nose normal.  Eyes:     Pupils: Pupils are equal, round, and reactive to light.  Cardiovascular:     Rate and Rhythm: Normal rate and regular rhythm.     Pulses: Normal pulses.     Heart sounds: Normal heart sounds.  Pulmonary:     Effort: Pulmonary effort is normal. No respiratory distress.     Breath sounds: Normal breath sounds.  Abdominal:     General: Bowel sounds are normal. There is no distension.     Palpations: Abdomen is soft.     Tenderness: There is no abdominal tenderness. There is no guarding or rebound.  Genitourinary:    Vagina: No vaginal discharge.  Musculoskeletal:        General: Normal range of motion.     Cervical back: Neck supple.  Skin:    General: Skin is warm and dry.     Capillary Refill: Capillary refill takes less than 2 seconds.     Findings: No erythema or rash.  Neurological:     General: No focal deficit present.     Mental Status: She is alert.     Deep Tendon Reflexes: Reflexes normal.  Psychiatric:        Mood and Affect: Mood normal.     ED Results / Procedures / Treatments   Labs (all labs ordered are listed, but only abnormal results are displayed) Results for orders placed or performed during the hospital encounter of 09/22/22  Lipase, blood  Result Value Ref Range   Lipase 33 11 - 51 U/L  Comprehensive metabolic panel  Result Value Ref Range    Sodium 137 135 - 145 mmol/L   Potassium 4.2 3.5 - 5.1 mmol/L   Chloride 102 98 - 111 mmol/L   CO2 23 22 - 32 mmol/L   Glucose, Bld 82 70 - 99 mg/dL   BUN 25 (H) 8 - 23 mg/dL   Creatinine, Ser 1.50 (H) 0.44 - 1.00 mg/dL   Calcium 10.2 8.9 - 10.3 mg/dL   Total Protein 7.6 6.5 - 8.1 g/dL   Albumin 4.3 3.5 - 5.0 g/dL   AST 23 15 - 41 U/L   ALT 14 0 - 44 U/L   Alkaline Phosphatase 88 38 - 126 U/L   Total Bilirubin 1.0 0.3 - 1.2 mg/dL   GFR, Estimated 35 (L) >60 mL/min   Anion gap 12 5 - 15  CBC with Differential  Result Value Ref Range   WBC 10.2 4.0 - 10.5 K/uL   RBC 4.22 3.87 - 5.11 MIL/uL   Hemoglobin 12.7 12.0 - 15.0 g/dL   HCT 36.8 36.0 - 46.0 %   MCV 87.2 80.0 - 100.0 fL   MCH 30.1 26.0 - 34.0 pg   MCHC 34.5 30.0 - 36.0 g/dL   RDW 14.3 11.5 - 15.5 %   Platelets 396 150 - 400 K/uL   nRBC 0.0 0.0 - 0.2 %   Neutrophils Relative % 78 %   Neutro Abs 8.0 (H) 1.7 - 7.7 K/uL   Lymphocytes Relative 14 %   Lymphs Abs 1.5 0.7 - 4.0 K/uL   Monocytes Relative 6 %   Monocytes Absolute 0.7 0.1 - 1.0 K/uL   Eosinophils Relative 1 %   Eosinophils Absolute 0.1 0.0 - 0.5 K/uL   Basophils Relative 1 %   Basophils Absolute 0.1 0.0 - 0.1 K/uL   Immature Granulocytes 0 %   Abs Immature Granulocytes 0.04  0.00 - 0.07 K/uL  CBG monitoring, ED  Result Value Ref Range   Glucose-Capillary 73 70 - 99 mg/dL  Troponin I (High Sensitivity)  Result Value Ref Range   Troponin I (High Sensitivity) 30 (H) <18 ng/L  Troponin I (High Sensitivity)  Result Value Ref Range   Troponin I (High Sensitivity) 33 (H) <18 ng/L   DG Chest 2 View  Result Date: 09/19/2022 CLINICAL DATA:  Chest pain nausea vomiting EXAM: CHEST - 2 VIEW COMPARISON:  Chest 09/13/2022 FINDINGS: The heart size and mediastinal contours are within normal limits. Both lungs are clear. The visualized skeletal structures are unremarkable. IMPRESSION: No active cardiopulmonary disease. Electronically Signed   By: Franchot Gallo M.D.   On:  09/19/2022 18:58   CARDIAC CATHETERIZATION  Result Date: 09/14/2022 1.  Patent left main with no stenosis 2.  Patent circumflex with mild nonobstructive plaquing 3.  Patent RCA with 50% proximal stenosis and a patent stent in the mid vessel, unchanged from the recent procedure 4.  Continued patency of the proximal LAD stent and moderate stenosis of the mid LAD at the previous angioplasty site, area not favorable for PCI due to calcification, angulation, and small vessel caliber. Recommendations: Medical therapy.  Resume apixaban tomorrow morning.  Continue aspirin for at least another 30 days and clopidogrel for at least 6 months.   DG Chest Port 1 View  Result Date: 09/13/2022 CLINICAL DATA:  Chest pain. EXAM: PORTABLE CHEST 1 VIEW COMPARISON:  09/08/2022. FINDINGS: The heart size and mediastinal contours are within normal limits. There is atherosclerotic calcification of the aorta. No consolidation, effusion, or pneumothorax. Degenerative changes are present in the thoracic spine. No acute osseous abnormality. IMPRESSION: No active disease. Electronically Signed   By: Brett Fairy M.D.   On: 09/13/2022 21:20   DG Chest 2 View  Result Date: 09/08/2022 CLINICAL DATA:  Left-sided chest pain. EXAM: CHEST - 2 VIEW COMPARISON:  December 26, 2021 FINDINGS: The heart size and mediastinal contours are within normal limits. Both lungs are clear. Multilevel degenerative changes are seen throughout the thoracic spine. IMPRESSION: No active cardiopulmonary disease. Electronically Signed   By: Virgina Norfolk M.D.   On: 09/08/2022 22:37   CARDIAC CATHETERIZATION  Result Date: 08/31/2022 1.  Severe single-vessel coronary artery disease with calcified complex diffuse stenosis of the proximal, mid, and distal LAD. 2.  Patency of the left main and left circumflex 3.  Previous stenting of the RCA with continued patency of the RCA stent, moderate nonobstructive proximal and distal RCA stenoses of 50% not  significantly changed from the previous cath study 4.  Successful PCI of the LAD with stenting of the proximal vessel using a 2.5 x 22 mm Onyx frontier DES and angioplasty of the mid LAD using a 2.0 mm balloon Recommendations: Same-day PCI protocol if criteria met, aspirin 81 mg daily x30 days, resume apixaban tomorrow, continue clopidogrel x6 months.  If patient tolerates a combination of apixaban and antiplatelet therapy, would favor keeping her on a low-dose of aspirin long-term due to her extensive CAD.    EKG EKG Interpretation  Date/Time:  Sunday September 23 2022 00:25:47 EDT Ventricular Rate:  93 PR Interval:  259 QRS Duration: 101 QT Interval:  360 QTC Calculation: 448 R Axis:   51 Text Interpretation: Sinus rhythm Prolonged PR interval Confirmed by Dory Horn) on 09/23/2022 1:36:47 AM  Radiology No results found.  Procedures Procedures    Medications Ordered in ED Medications  ondansetron (ZOFRAN) injection 4  mg (4 mg Intravenous Given 09/22/22 2349)  sodium chloride 0.9 % bolus 500 mL (0 mLs Intravenous Stopped 09/23/22 0059)    ED Course/ Medical Decision Making/ A&P                           Medical Decision Making Patient with stents x 10 days ago and recently restarted ozempic presents with 1 week of nausea and emesis.    Amount and/or Complexity of Data Reviewed Independent Historian:     Details: Relative see above  External Data Reviewed: labs and notes.    Details: Previous notes reviewed  Labs: ordered.    Details: All labs reviewed: lipase normal 33, troponin 33 and normal.  Troponin 30 and 33, improved from last ED visits.  Normal sodium 137, normal potassium 4.2 elevated creatinijne 1.5, normal LFTS.  White count 10.2, normal hemoglobin 12.7, normal platelets ECG/medicine tests: ordered and independent interpretation performed. Decision-making details documented in ED Course.  Risk Prescription drug management. Risk Details: Patient without  further emesis in the ED.  Po challenged.  I do not believe this is cardiac.  EKG is unchanged and troponins are improved from last ED visit.  There is no pain or SOB.  I believe this to be a known side effect of Ozempic.  Discontinue this drug and talk to the prescriber.  Stable for discharge.  Strict return,      Final Clinical Impression(s) / ED Diagnoses Final diagnoses:  None   Return for intractable cough, coughing up blood, fevers > 100.4 unrelieved by medication, shortness of breath, intractable vomiting, chest pain, shortness of breath, weakness, numbness, changes in speech, facial asymmetry, abdominal pain, passing out, Inability to tolerate liquids or food, cough, altered mental status or any concerns. No signs of systemic illness or infection. The patient is nontoxic-appearing on exam and vital signs are within normal limits.  I have reviewed the triage vital signs and the nursing notes. Pertinent labs & imaging results that were available during my care of the patient were reviewed by me and considered in my medical decision making (see chart for details). After history, exam, and medical workup I feel the patient has been appropriately medically screened and is safe for discharge home. Pertinent diagnoses were discussed with the patient. Patient was given return precautions.    Rx / DC Orders ED Discharge Orders     None         Palumbo, April, MD 09/23/22 [phone]

## 2022-09-26 ENCOUNTER — Ambulatory Visit (INDEPENDENT_AMBULATORY_CARE_PROVIDER_SITE_OTHER): Payer: PPO | Admitting: Family Medicine

## 2022-09-26 ENCOUNTER — Encounter: Payer: Self-pay | Admitting: Family Medicine

## 2022-09-26 VITALS — BP 89/41 | Ht 64.0 in | Wt 181.0 lb

## 2022-09-26 DIAGNOSIS — R112 Nausea with vomiting, unspecified: Secondary | ICD-10-CM | POA: Diagnosis not present

## 2022-09-26 DIAGNOSIS — R5383 Other fatigue: Secondary | ICD-10-CM

## 2022-09-26 DIAGNOSIS — E861 Hypovolemia: Secondary | ICD-10-CM

## 2022-09-26 DIAGNOSIS — E039 Hypothyroidism, unspecified: Secondary | ICD-10-CM

## 2022-09-26 DIAGNOSIS — I9589 Other hypotension: Secondary | ICD-10-CM | POA: Diagnosis not present

## 2022-09-26 DIAGNOSIS — E118 Type 2 diabetes mellitus with unspecified complications: Secondary | ICD-10-CM

## 2022-09-26 MED ORDER — ONDANSETRON 8 MG PO TBDP: ORAL_TABLET | ORAL | 1 refills | Status: DC

## 2022-09-26 NOTE — Progress Notes (Signed)
Established Patient Office Visit  Subjective   Patient ID: Tara Campbell, female    DOB: 1946/02/13  Age: 76 y.o. MRN: [id number]  Chief Complaint  Patient presents with   Hospitalization Follow-up    HPI Here Today for hospital follow-up.  He was discharged from the hospital on October 14 after being admitted for 2 days.  She started having pressure in her chest as well as radiation into her left arm and associated nausea and vomiting and diaphoresis.  She was admitted for non-STEMI.  She underwent LHC on October 13.  Did not show any new lesions.  She had already had a stent placed and heart catheterization the month prior.  She was discharged home on atenolol and Eliquis.  She went back to the emergency department on October 19 after having another episode of chest pain for which she did take nitroglycerin and get some relief.  She was started on Ranexa.  Then went back to the emergency department on October 22 for nausea and vomiting.  She had restarted her Ozempic almost a week prior she had never had significant nausea and vomiting prior but she had been off the medication for some time.  She has not vomited since the day she went to the ED about 3 days ago.  But still feels quite nauseated in fact she is out of the Zofran that she was given but did find it helpful.  She is worked with her sister who is a Marine scientist to better schedule and arrange her medications and brought in a copy of the timing and dosing of her medications.  Still just feels extremely weak and tired.  They are recommending cardiac rehab but have not called to schedule her yet.     ROS    Objective:     BP (!) 89/41   Ht 5\' 4"  (1.626 m)   Wt 181 lb (82.1 kg)   SpO2 96%   BMI 31.07 kg/m    Physical Exam Vitals reviewed.  Constitutional:      Appearance: She is well-developed.  HENT:     Head: Normocephalic and atraumatic.  Eyes:     Conjunctiva/sclera: Conjunctivae normal.  Cardiovascular:     Rate  and Rhythm: Normal rate.  Pulmonary:     Effort: Pulmonary effort is normal.  Skin:    General: Skin is dry.     Coloration: Skin is not pale.  Neurological:     Mental Status: She is alert and oriented to person, place, and time.  Psychiatric:        Behavior: Behavior normal.      No results found for any visits on 09/26/22.    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  Continue to hold Ozempic and Jardiance.  If the Ozempic is responsible for the significant nausea and vomiting then it should decrease as we get into the weekend.  But if she is not improving then please let me know as we may need to look for other causes.  She is still taking the metformin though has been holding it if her blood glucose is less than  100.      Acquired hypothyroidism - Primary    Due to recheck TSH.  She has felt really shaky and had chills since she has been home but has been afebrile.  This will make sure that her level is therapeutic.  It was slightly elevated back in January at 6.1.      Relevant Orders   TSH   Other Visit Diagnoses     Nausea and vomiting, unspecified vomiting type       Relevant Medications   ondansetron (ZOFRAN-ODT) 8 MG disintegrating tablet   Other fatigue       Hypotension due to hypovolemia          She was hypotensive during the office visit today.  Encouraged her to drink fluids and stay hydrated today.  She had already taken her blood pressure medications for this morning.  Recommend repeat blood pressure this evening before her evening dose of medications.  If blood pressures less than 120 but greater than 100 then split that you irbesartan in half.  If blood  pressures less than 100 then skip the irbesartan completely.  [place] to continue to work on increasing hydration.  Nausea and vomiting-did refill Zofran today monitor for excess sedation especially with 8 mg tabs.  Teague after recent stent placement and MI.  Hopefully they can get her into cardiac rehab soon and really work on building back her strength.  Return in about 8 days (around 10/04/2022).    Nani Gasser, MD

## 2022-09-26 NOTE — Assessment & Plan Note (Signed)
Blood sugars have been right around 100.  Continue to hold Ozempic and Jardiance.  If the Ozempic is responsible for the significant nausea and vomiting then it should decrease as we get into the weekend.  But if Tara Campbell is not improving then please let me know as we may need to look for other causes.  Tara Campbell is still taking the metformin though has been holding it if her blood glucose is less than 100.

## 2022-09-26 NOTE — Assessment & Plan Note (Signed)
Due to recheck TSH.  She has felt really shaky and had chills since she has been home but has been afebrile.  This will make sure that her level is therapeutic.  It was slightly elevated back in January at 6.1.

## 2022-09-26 NOTE — Progress Notes (Signed)
At check out pt got shaky her BP was taken it was 99/48 P:86. She was given sprite to drink.  Pt had labs drawn and her bp after labs was 83/47 P:88.  Pt was given water to drink and told to continue drinking more fluids and checking her BP

## 2022-09-26 NOTE — Patient Instructions (Signed)
If blood pressure this evening is less than 120 but above 100 then please cut her irbesartan in half and take a half a tab. If blood pressure is still under 100 then skip the irbesartan completely tonight. Recheck pressures in the morning. Try to increase fluid intake today.

## 2022-09-27 ENCOUNTER — Ambulatory Visit: Payer: PPO | Admitting: Nurse Practitioner

## 2022-09-27 ENCOUNTER — Encounter: Payer: Self-pay | Admitting: Family Medicine

## 2022-09-27 LAB — TSH: TSH: 2.93 mIU/L (ref 0.40–4.50)

## 2022-09-27 NOTE — Progress Notes (Signed)
Hi Sandy, thyroid level looks great at 2.9.  We will continue with current regimen as is.  Let us know if your blood pressure looked a little better today.

## 2022-09-28 ENCOUNTER — Other Ambulatory Visit: Payer: Self-pay | Admitting: Family Medicine

## 2022-09-28 DIAGNOSIS — R112 Nausea with vomiting, unspecified: Secondary | ICD-10-CM

## 2022-09-28 MED ORDER — ONDANSETRON 8 MG PO TBDP: ORAL_TABLET | ORAL | 1 refills | Status: DC

## 2022-09-28 NOTE — Progress Notes (Signed)
The original prescription was faxed 2 days ago but evidently the pharmacy did not receive it so reordered electronically today.

## 2022-09-28 NOTE — Progress Notes (Signed)
Refill was sent for the Zofran 8 mg on 10/25.  Does she feel like she needs suppositories?  It is possible if the Zoloft up just never seen it cause that much nausea especially at 25 mg but I know it is new so I think it would be reasonable to just put that on hold for a week and then we could always restart it again if needed.  I am glad her blood pressure it was at least better than it was when she came in.  Just continue to hold that irbesartan completely until her blood pressures are greater than 130.

## 2022-10-01 ENCOUNTER — Ambulatory Visit (HOSPITAL_COMMUNITY)
Admit: 2022-10-01 | Discharge: 2022-10-01 | Disposition: A | Discharge status: Home / Self Care | Payer: PPO | Attending: Cardiology | Admitting: Cardiology

## 2022-10-01 ENCOUNTER — Encounter (HOSPITAL_COMMUNITY): Payer: Self-pay | Admitting: Cardiology

## 2022-10-01 VITALS — BP 112/70 | HR 94 | Wt 177.0 lb

## 2022-10-01 DIAGNOSIS — I25119 Atherosclerotic heart disease of native coronary artery with unspecified angina pectoris: Secondary | ICD-10-CM | POA: Insufficient documentation

## 2022-10-01 DIAGNOSIS — I5022 Chronic systolic (congestive) heart failure: Secondary | ICD-10-CM | POA: Insufficient documentation

## 2022-10-01 LAB — BASIC METABOLIC PANEL
Anion gap: 12 (ref 5–15)
BUN: 25 mg/dL — ABNORMAL HIGH (ref 8–23)
CO2: 22 mmol/L (ref 22–32)
Calcium: 10.5 mg/dL — ABNORMAL HIGH (ref 8.9–10.3)
Chloride: 101 mmol/L (ref 98–111)
Creatinine, Ser: 1.77 mg/dL — ABNORMAL HIGH (ref 0.44–1.00)
GFR, Estimated: 29 mL/min — ABNORMAL LOW (ref >60)
Glucose, Bld: 80 mg/dL (ref 70–99)
Potassium: 5.1 mmol/L (ref 3.5–5.1)
Sodium: 135 mmol/L (ref 135–145)

## 2022-10-01 MED ORDER — EMPAGLIFLOZIN 10 MG PO TABS: 10.0000 mg | ORAL_TABLET | Freq: Every day | ORAL | 11 refills | Status: DC

## 2022-10-01 NOTE — Patient Instructions (Signed)
RESTART Jardiance.  STOP Asprin after this month.  Labs done today, your results will be available in MyChart, we will contact you for abnormal readings.   You have been referred to Cardiac Rehab. They will call you to arrange your appointment.   Your physician recommends that you schedule a follow-up appointment in: 3 months ( January 2024)  ** please call the office Mid November to arrange your follow up appointment   If you have any questions or concerns before your next appointment please send [place] a message through [place] or call our office at [phone].    TO LEAVE A MESSAGE FOR THE NURSE SELECT OPTION 2, PLEASE LEAVE A MESSAGE INCLUDING: YOUR NAME DATE OF BIRTH CALL BACK NUMBER REASON FOR CALL**this is important as we prioritize the call backs  YOU WILL RECEIVE A CALL BACK THE SAME DAY AS LONG AS YOU CALL BEFORE 4:00 PM  At the [facility], you and your health needs are our priority. As part of our continuing mission to provide you with exceptional heart care, we have created designated Provider Care Teams. These Care Teams include your primary Cardiologist (physician) and Advanced Practice Providers (APPs- Physician Assistants and Nurse Practitioners) who all work together to provide you with the care you need, when you need it.   You may see any of the following providers on your designated Care Team at your next follow up: Dr Glori Bickers Dr Loralie Champagne Dr. Roxana Hires, NP Lyda Jester, [place] [facility] [place], [place] Forestine Na, NP Audry Riles, PharmD   Please be sure to bring in all your medications bottles to every appointment.

## 2022-10-02 ENCOUNTER — Telehealth (HOSPITAL_COMMUNITY): Payer: Self-pay

## 2022-10-02 ENCOUNTER — Encounter (HOSPITAL_COMMUNITY): Payer: Self-pay | Admitting: Cardiology

## 2022-10-02 DIAGNOSIS — I5022 Chronic systolic (congestive) heart failure: Secondary | ICD-10-CM

## 2022-10-02 NOTE — Telephone Encounter (Addendum)
Pt aware, agreeable, and verbalized understanding Labs ordered   ----- Message from Larey Dresser, MD sent at 10/02/2022  3:23 PM EDT ----- Stay off irbesartan, increase hydration, repeat BMET in 1 week.  Creatinine higher.

## 2022-10-02 NOTE — Progress Notes (Signed)
Primary Care: Dr Linford Arnold  Primary Cardiologist: Dr Jens Som  HF Cardiology: Dr. Shirlee Latch  HPI: [name] is a 76 y.o. with a history of CAD, PAF, Asthma, HTN, DMII. Had previous PCI to RCA.   Admitted 04/2021 with NSTEMI. Had cath that showed severe complex stenosis of her LAD in the proximal mid and distal portion of the vessel.  Patent left circumflex with no significant stenosis, patent left main with no significant stenosis, patent RCA with RCA stents mild nonobstructive disease less than 50% stenosis and normal LVEDP.  Her LAD stenosis was unfavorable for PCI due to heavy calcification and marked tortuosity. There is also noted to be a small caliber vessel with likely poor targets for CABG.  Medical therapy was recommended.  Plavix stopped in 9/22 due to nose bleeds.   She was admitted in 1/23 with ?TIA => neurology evaluated, imaging showed no CVA.  Echo in 1/23 showed EF 60-65%, normal RV, IVC normal.   Patient had markedly positive cardiac PET for anterior ischemia.  She had difficult PCI to proximal LAD in 9/23 with DES and PTCA mid LAD.  There was residual 90% stenosis in the mid/distal LAD that could not be approached due to tortuosity. Patient developed chest pain in 10/23 and went to the ER with NSTEMI, repeat cath was unchanged from 9/23 with patent stent, 50% mLAD stenosis at site of PTCA, and 90% mid-distal LAD (not approachable percutaneously).  She had been off Plavix by accident when this event occurred.  She was treated medically.  She was back in the ER again later in 10/23 with chest pain, troponin was negative.  She was started on ranolazine.   Patient returns for followup of CAD.  Recently, she has been having trouble with nausea/vomiting.  Initially thought this was related to Ozempic but now think it was related to new start of Zoloft.  Off Zoloft, nausea/vomiting have resolved.  She still has poor energy. She is generally tired.  She has not had any further chest pain  and does not have dyspnea.   Labs (1/23): K 3.9, creatinine 0.9, LDL 66 Labs (10/23): K 4.2, creatinine 1.5, LDL 18  ECG (personally reviewed): NSR, 1st degree AVB, QTc 442 msec, nonspecific T wave changes  PMH: 1. CAD: Remote PCI to RCA.  NSTEMI in 5/22, LHC showed severe complex stenosis proximal/mid/distal LAD with patent RCA stents.  LAD was unfavorable for PCI with marked tortuosity and small caliber, also poor target for CABG due to small size.  Medical management.  - Echo (1/23): EF 60-65%, normal RV, normal IVC.  - LHC (9/23): She had difficult PCI to proximal LAD with DES and PTCA mid LAD.  There was residual 90% stenosis in the mid/distal LAD that could not be approached due to tortuosity. - NSTEMI (10/23): Repeat cath was unchanged from 9/23 with patent stent, 50% mLAD stenosis at site of PTCA, and 90% mid-distal LAD (not approachable percutaneously). 2. Atrial fibrillation: Paroxysmal.  3. ?TIA in 1/23: Imaging negative.  4. HTN 5. Type 2 diabetes 6. Asthma 7. ABIs normal in 12/22 8. Gout 9. Hyperlipidemia  Review of Systems: All systems reviewed and negative except as per HPI.   Current Outpatient Medications  Medication Sig Dispense Refill   acetaminophen (TYLENOL) 650 MG CR tablet Take 1 tablet (650 mg total) by mouth every 8 (eight) hours as needed for pain. 90 tablet 3   albuterol (VENTOLIN HFA) 108 (90 Base) MCG/ACT inhaler Inhale 2 puffs into the lungs  every 6 (six) hours as needed for wheezing. 18 g prn   allopurinol (ZYLOPRIM) 300 MG tablet Take 1 tablet (300 mg total) by mouth 2 (two) times daily. 180 tablet 1   apixaban (ELIQUIS) 5 MG TABS tablet Take 1 tablet (5 mg total) by mouth 2 (two) times daily. 180 tablet 1   aspirin EC 81 MG tablet Take 1 tablet (81 mg total) by mouth daily. Swallow whole. 30 tablet 11   atenolol (TENORMIN) 100 MG tablet Take 1 tablet (100 mg total) by mouth daily. 90 tablet 1   atorvastatin (LIPITOR) 80 MG tablet Take 1 tablet (80 mg  total) by mouth at bedtime. 90 tablet 3   budesonide-formoterol (SYMBICORT) 80-4.5 MCG/ACT inhaler Inhale 2 puffs into the lungs in the morning and at bedtime. 10.2 g 5   clopidogrel (PLAVIX) 75 MG tablet Take 1 tablet (75 mg total) by mouth daily. 90 tablet 3   cyanocobalamin (VITAMIN B12) 1000 MCG tablet Take 1,000 mcg by mouth daily.     empagliflozin (JARDIANCE) 10 MG TABS tablet Take 1 tablet (10 mg total) by mouth daily before breakfast. 30 tablet 11   EPINEPHrine 0.3 mg/0.3 mL IJ SOAJ injection Inject 0.3 mg into the muscle as needed for anaphylaxis. Call 9-1-1 after use. 1 each 2   Evolocumab with Infusor (REPATHA PUSHTRONEX SYSTEM) 420 MG/3.5ML SOCT Inject 420 mg into the skin every 30 (thirty) days. 3.6 mL 11   ezetimibe (ZETIA) 10 MG tablet Take 1 tablet (10 mg total) by mouth daily. 90 tablet 3   fluticasone (FLONASE) 50 MCG/ACT nasal spray Place 1 spray into both nostrils daily. 16 g 3   glucose blood test strip To be used twice daily for testing blood sugars. E11.65 200 each 11   ipratropium-albuterol (DUONEB) 0.5-2.5 (3) MG/3ML SOLN Take 3 mLs by nebulization every 6 (six) hours as needed. 3 mL PRN   isosorbide mononitrate (IMDUR) 60 MG 24 hr tablet Take 60 mg by mouth 2 (two) times daily.     levothyroxine (SYNTHROID) 125 MCG tablet Take 1 tablet (125 mcg total) by mouth daily before breakfast. 90 tablet 0   Mepolizumab ([name]) 100 MG/ML SOAJ Inject 1 mL (100 mg total) into the skin every 28 (twenty-eight) days. 1 mL 4   metFORMIN (GLUCOPHAGE) 500 MG tablet Take 2 tablets (1,000 mg total) by mouth 2 (two) times daily with a meal. 180 tablet 3   nitroGLYCERIN (NITROSTAT) 0.4 MG SL tablet Place 1 tablet (0.4 mg total) under the tongue every 5 (five) minutes as needed for chest pain (or tightness). 25 tablet PRN   Omega-3 Fatty Acids (FISH OIL) 1000 MG CAPS Take 1 capsule (1,000 mg total) by mouth daily. 90 capsule 3   ondansetron (ZOFRAN-ODT) 8 MG disintegrating tablet 8mg  ODT q8  hours prn nausea 20 tablet 1   Polyvinyl Alcohol-Povidone (REFRESH OP) Place 1 drop into both eyes daily as needed (dry eyes).     ranolazine (RANEXA) 500 MG 12 hr tablet Take 1 tablet (500 mg total) by mouth 2 (two) times daily. 60 tablet 5   Vitamin D, Cholecalciferol, 25 MCG (1000 UT) TABS Take 25 mcg by mouth daily in the afternoon. 60 tablet    zinc gluconate 50 MG tablet Take 50 mg by mouth at bedtime.     irbesartan (AVAPRO) 150 MG tablet Take 2 tablets (300 mg total) by mouth daily. (Patient not taking: Reported on 10/01/2022) 90 tablet 3   No current facility-administered medications for this encounter.  Allergies  Allergen Reactions   Sulfamethoxazole Rash    HIVES   Sulfa Antibiotics Rash   Tape Rash    Vinyl;tape      Social History   Socioeconomic History   Marital status: Divorced    Spouse name: Not on file   Number of children: 2   Years of education: 13.5   Highest education level: 12th grade  Occupational History    Comment: retired  Tobacco Use   Smoking status: Never   Smokeless tobacco: Never  Vaping Use   Vaping Use: Never used  Substance and Sexual Activity   Alcohol use: Yes    Comment: Rare   Drug use: No   Sexual activity: Not Currently  Other Topics Concern   Not on file  Social History Narrative   Lives alone. Likes to crafts and paint in her free time.      Right Handed    Lives in a one story home    Social Determinants of Health   Financial Resource Strain: Low Risk  (04/06/2021)   Overall Financial Resource Strain (CARDIA)    Difficulty of Paying Living Expenses: Not hard at all  Food Insecurity: No Food Insecurity (04/06/2021)   Hunger Vital Sign    Worried About Running Out of Food in the Last Year: Never true    Ran Out of Food in the Last Year: Never true  Transportation Needs: No Transportation Needs (04/06/2021)   PRAPARE - Hydrologist (Medical): No    Lack of Transportation (Non-Medical): No   Physical Activity: Inactive (04/06/2021)   Exercise Vital Sign    Days of Exercise per Week: 0 days    Minutes of Exercise per Session: 0 min  Stress: No Stress Concern Present (04/06/2021)   [employer]    Feeling of Stress : Not at all  Social Connections: Moderately Isolated (04/06/2021)   Social Connection and Isolation Panel [NHANES]    Frequency of Communication with Friends and Family: More than three times a week    Frequency of Social Gatherings with Friends and Family: More than three times a week    Attends Religious Services: Never    Marine scientist or Organizations: Yes    Attends Music therapist: More than 4 times per year    Marital Status: Divorced  Intimate Partner Violence: Not At Risk (04/06/2021)   Humiliation, Afraid, Rape, and Kick questionnaire    Fear of Current or Ex-Partner: No    Emotionally Abused: No    Physically Abused: No    Sexually Abused: No      Family History  Problem Relation Age of Onset   Hypertension Mother    Hyperlipidemia Mother    Alzheimer's disease Mother    Vascular Disease Mother    Glaucoma Father    Heart disease Father    Hypertension Father    Diabetes Father    Hyperlipidemia Father    Skin cancer Father    Diabetes Paternal Grandmother    Colon cancer Neg Hx    Esophageal cancer Neg Hx     Vitals:   10/01/22 1530  BP: 112/70  Pulse: 94  SpO2: 99%  Weight: 80.3 kg (177 lb)   Wt Readings from Last 3 Encounters:  10/01/22 80.3 kg (177 lb)  09/26/22 82.1 kg (181 lb)  09/22/22 81.6 kg (180 lb)    PHYSICAL EXAM: General: NAD Neck: No  JVD, no thyromegaly or thyroid nodule.  Lungs: Clear to auscultation bilaterally with normal respiratory effort. CV: Nondisplaced PMI.  Heart regular S1/S2, no S3/S4, no murmur.  No peripheral edema.  No carotid bruit.  Normal pedal pulses.  Abdomen: Soft, nontender, no hepatosplenomegaly, no  distention.  Skin: Intact without lesions or rashes.  Neurologic: Alert and oriented x 3.  Psych: Normal affect. Extremities: No clubbing or cyanosis.  HEENT: Normal.   ASSESSMENT & PLAN: 1. CAD: Remote RCA PCI.  NSTEMI in 5/22, LHC showed severe complex stenosis proximal/mid/distal LAD with patent RCA stents.  LAD was unfavorable for PCI with marked tortuosity and small caliber, also poor target for CABG due to small size.  Medical management initially.  Patient had worsening of angina and cardiac PET was done that was high risk.  She then had cath in 9/23 with difficult PCI to proximal LAD with DES and PTCA mid LAD.  There was residual 90% stenosis in the mid/distal LAD that could not be approached due to tortuosity.  She accidentally stopped her Plavix and had NSTEMI in 10/23 with repeat cath unchanged from 9/23 with patent stent, 50% mLAD stenosis at site of PTCA, and 90% mid-distal LAD (not approachable percutaneously). Recently, she has not had chest pain.  - Continue ASA 81 until November then can stop (1 month post-PCI).   - Continue Plavix 75 daily at least 1 year.  - Continue atorvastatin 80 mg daily + Zetia + Repatha, LDL was 18 in 10/23.  - Continue atenolol 100 mg daily.  - Continue Imdur 120 mg daily.  - Continue ranolazine 500 mg bid, QTc stable on ECG today.  - I will arrange for cardiac rehab.  - Restart Jardiance 10 mg daily, BMET today and in 10 days.  2. Atrial fibrillation: Paroxysmal.  She is in NSR.  - Continue Eliquis.   3. HTN: BP controlled.  - She will stay off irbesartan for now with elevated creatinine. BMET today.  4. Diabetes: Continue semaglutide.   She will followup in 3 months.   Marca Ancona 10/02/2022

## 2022-10-04 ENCOUNTER — Ambulatory Visit (INDEPENDENT_AMBULATORY_CARE_PROVIDER_SITE_OTHER): Payer: PPO | Admitting: Family Medicine

## 2022-10-04 ENCOUNTER — Encounter: Payer: Self-pay | Admitting: Family Medicine

## 2022-10-04 VITALS — BP 117/70 | HR 95 | Ht 64.0 in | Wt 177.1 lb

## 2022-10-04 DIAGNOSIS — I9589 Other hypotension: Secondary | ICD-10-CM

## 2022-10-04 DIAGNOSIS — R040 Epistaxis: Secondary | ICD-10-CM | POA: Diagnosis not present

## 2022-10-04 DIAGNOSIS — E1122 Type 2 diabetes mellitus with diabetic chronic kidney disease: Secondary | ICD-10-CM | POA: Diagnosis not present

## 2022-10-04 DIAGNOSIS — R112 Nausea with vomiting, unspecified: Secondary | ICD-10-CM | POA: Diagnosis not present

## 2022-10-04 DIAGNOSIS — E861 Hypovolemia: Secondary | ICD-10-CM

## 2022-10-04 DIAGNOSIS — I5022 Chronic systolic (congestive) heart failure: Secondary | ICD-10-CM

## 2022-10-04 DIAGNOSIS — T50905A Adverse effect of unspecified drugs, medicaments and biological substances, initial encounter: Secondary | ICD-10-CM | POA: Diagnosis not present

## 2022-10-04 DIAGNOSIS — N1831 Chronic kidney disease, stage 3a: Secondary | ICD-10-CM

## 2022-10-04 DIAGNOSIS — I10 Essential (primary) hypertension: Secondary | ICD-10-CM | POA: Diagnosis not present

## 2022-10-04 DIAGNOSIS — F411 Generalized anxiety disorder: Secondary | ICD-10-CM

## 2022-10-04 MED ORDER — MUPIROCIN 2 % EX OINT: TOPICAL_OINTMENT | CUTANEOUS | 0 refills | Status: DC

## 2022-10-04 NOTE — Progress Notes (Signed)
Established Patient Office Visit  Subjective   Patient ID: LOTA LEAMER, female    DOB: 08/30/1946  Age: 76 y.o. MRN: [id number]  Chief Complaint  Patient presents with   Hypertension    HPI  F/U for low BPS, with systolics less than 754.  She has been holding the irbesartan because of low blood pressures.  Since Monday she is actually been feeling better in regards to the nausea and vomiting and has been able to get a little bit more oral intake.  Since then her blood pressures have actually leveled out.  Yesterday she actually had a blood pressure in the 150s.  She also reports bilateral nosebleeds that been going on for a while.  She will get scabs and then they come out and then she will get a nosebleed.  She is never really had issues with this before.  She is actually off of her baby aspirin now.  She has noticed since stopping the sertraline that the nausea and vomiting has improved significantly.  Within a day of stopping it she started to feel some improvement.     ROS    Objective:     BP 117/70   Pulse 95   Ht 5\' 4"  (1.626 m)   Wt 177 lb 1.3 oz (80.3 kg)   SpO2 99%   BMI 30.40 kg/m    Physical Exam Vitals and nursing note reviewed.  Constitutional:      Appearance: She is well-developed.  HENT:     Head: Normocephalic and atraumatic.  Cardiovascular:     Rate and Rhythm: Normal rate and regular rhythm.     Heart sounds: Normal heart sounds.  Pulmonary:     Effort: Pulmonary effort is normal.     Breath sounds: Normal breath sounds.  Skin:    General: Skin is warm and dry.  Neurological:     Mental Status: She is alert and oriented to person, place, and time.  Psychiatric:        Behavior: Behavior normal.      No results found for any visits on 10/04/22.    The ASCVD Risk score (Arnett DK, et al., 2019) failed to calculate for the following reasons:   The patient has a prior MI or stroke diagnosis    Assessment & Plan:   Problem  List Items Addressed This Visit       Cardiovascular and Mediastinum   Hypertension goal BP (blood pressure) < 140/90 (Chronic)    Blood pressure looking much better overall.  Okay to restart half a tab of the year irbesartan and just continue to monitor blood pressure carefully.  I think part of that improvement is also just being able to hydrate and eat a little bit more since the nausea and vomiting has improved significantly.        Endocrine   Type 2 diabetes mellitus with diabetic chronic kidney disease ([place])    Currently just taking the Jardiance which is just been restarted and the metformin only.  Appetite really has only improved slightly over the last 3 days.  So we will continue to monitor for any hypoglycemia.  Though neither of these medications should worsen hypoglycemia.  Continue to hold GLP-1 injections.  Could consider restarting in the future.  Frustrated that she really has not lost much weight but she is down about 4 pounds on our scale.        Other   GAD (generalized anxiety disorder)  Can certainly circle back to some options for anxiety but for now we will hold any SSRIs.  She is feeling better at the discontinuation of the sertraline so we will need to consider trying something different in the future but right now just want to get her blood pressure and blood sugars better regulated and move forward from there.      Other Visit Diagnoses     Nausea and vomiting, unspecified vomiting type    -  Primary   Hypotension due to hypovolemia       Nosebleed       Relevant Medications   mupirocin ointment (BACTROBAN) 2 %   Chronic systolic congestive heart failure (HCC)       Medication side effect, initial encounter          Nosebleeds-recommend mupirocin ointment, avoiding blowing the nose harder over clearing of the nostrils.  Recommend running a coolmist humidifier at night to help keep the passages well moisturized.  Nausea/vomiting-seems to have improved  with stopping the sertraline.  The symptoms started around the time that she started the medication.  We can always circle back to treating her mood.  Medication side effect-added sertraline to intolerance list still not 100% sure if this was causing the nausea and vomiting but timing wise it makes sense since symptoms started to improve after discontinuation of the medication for so for now we will add that to the suspect list.  He is going next week to recheck her kidney function.  She would like to try to go to [facility] this time.  Return in about 6 weeks (around 11/15/2022) for Blood pressure .    Nani Gasser, MD

## 2022-10-04 NOTE — Patient Instructions (Signed)
Please restart half a tab of irbesartan.

## 2022-10-05 NOTE — Assessment & Plan Note (Signed)
Blood pressure looking much better overall.  Okay to restart half a tab of the year irbesartan and just continue to monitor blood pressure carefully.  I think part of that improvement is also just being able to hydrate and eat a little bit more since the nausea and vomiting has improved significantly.

## 2022-10-05 NOTE — Assessment & Plan Note (Signed)
Can certainly circle back to some options for anxiety but for now we will hold any SSRIs.  She is feeling better at the discontinuation of the sertraline so we will need to consider trying something different in the future but right now just want to get her blood pressure and blood sugars better regulated and move forward from there.

## 2022-10-05 NOTE — Assessment & Plan Note (Signed)
Currently just taking the Jardiance which is just been restarted and the metformin only.  Appetite really has only improved slightly over the last 3 days.  So we will continue to monitor for any hypoglycemia.  Though neither of these medications should worsen hypoglycemia.  Continue to hold GLP-1 injections.  Could consider restarting in the future.  Frustrated that she really has not lost much weight but she is down about 4 pounds on our scale.

## 2022-10-07 ENCOUNTER — Encounter (HOSPITAL_COMMUNITY): Payer: Self-pay | Admitting: Cardiology

## 2022-10-08 ENCOUNTER — Other Ambulatory Visit: Payer: Self-pay | Admitting: Family Medicine

## 2022-10-08 DIAGNOSIS — I5022 Chronic systolic (congestive) heart failure: Secondary | ICD-10-CM | POA: Diagnosis not present

## 2022-10-08 DIAGNOSIS — Z1231 Encounter for screening mammogram for malignant neoplasm of breast: Secondary | ICD-10-CM

## 2022-10-08 LAB — BASIC METABOLIC PANEL
BUN: 24 — AB (ref 4–21)
CO2: 24 — AB (ref 13–22)
Chloride: 101 (ref 99–108)
Creatinine: 2.1 — AB (ref 0.5–1.1)
Glucose: 87
Potassium: 4.2 mEq/L (ref 3.5–5.1)
Sodium: 135 — AB (ref 137–147)

## 2022-10-08 LAB — COMPREHENSIVE METABOLIC PANEL
Calcium: 10.1 (ref 8.7–10.7)
eGFR: 24

## 2022-10-10 ENCOUNTER — Ambulatory Visit: Payer: PPO | Admitting: Family Medicine

## 2022-10-14 ENCOUNTER — Encounter: Payer: Self-pay | Admitting: Family Medicine

## 2022-10-14 DIAGNOSIS — R7989 Other specified abnormal findings of blood chemistry: Secondary | ICD-10-CM

## 2022-10-15 ENCOUNTER — Encounter: Payer: Self-pay | Admitting: Family Medicine

## 2022-10-15 ENCOUNTER — Ambulatory Visit: Payer: PPO | Admitting: Nurse Practitioner

## 2022-10-17 ENCOUNTER — Other Ambulatory Visit: Payer: Self-pay | Admitting: Family Medicine

## 2022-10-17 ENCOUNTER — Ambulatory Visit (INDEPENDENT_AMBULATORY_CARE_PROVIDER_SITE_OTHER): Payer: PPO

## 2022-10-17 ENCOUNTER — Encounter: Payer: Self-pay | Admitting: Family Medicine

## 2022-10-17 DIAGNOSIS — I480 Paroxysmal atrial fibrillation: Secondary | ICD-10-CM

## 2022-10-17 DIAGNOSIS — Z1231 Encounter for screening mammogram for malignant neoplasm of breast: Secondary | ICD-10-CM

## 2022-10-17 DIAGNOSIS — M1A0790 Idiopathic chronic gout, unspecified ankle and foot, without tophus (tophi): Secondary | ICD-10-CM

## 2022-10-18 ENCOUNTER — Encounter (HOSPITAL_COMMUNITY): Payer: Self-pay | Admitting: Cardiology

## 2022-10-18 DIAGNOSIS — R7989 Other specified abnormal findings of blood chemistry: Secondary | ICD-10-CM | POA: Diagnosis not present

## 2022-10-18 NOTE — Telephone Encounter (Signed)
It looks like we were able to abstracted in.  Kidney function was at 2.1 but still looking a little extra dry on blood work.  Calcium level back in the normal range which is reassuring.

## 2022-10-19 NOTE — Progress Notes (Signed)
Please call patient. Normal mammogram.  Repeat in 1 year.  

## 2022-10-21 NOTE — Telephone Encounter (Signed)
PA approved to 09/03/23

## 2022-10-23 ENCOUNTER — Encounter: Payer: PPO | Admitting: Psychology

## 2022-11-02 ENCOUNTER — Encounter: Payer: PPO | Admitting: Psychology

## 2022-11-06 ENCOUNTER — Encounter (HOSPITAL_COMMUNITY): Payer: Self-pay

## 2022-11-06 ENCOUNTER — Telehealth (HOSPITAL_COMMUNITY): Payer: Self-pay

## 2022-11-06 NOTE — Telephone Encounter (Signed)
Attempted to call patient in regards to Cardiac Rehab - LM on VM Mailed letter 

## 2022-11-07 ENCOUNTER — Other Ambulatory Visit (HOSPITAL_COMMUNITY): Payer: Self-pay

## 2022-11-07 ENCOUNTER — Telehealth: Payer: Self-pay

## 2022-11-07 ENCOUNTER — Encounter: Payer: Self-pay | Admitting: Family Medicine

## 2022-11-07 DIAGNOSIS — I5022 Chronic systolic (congestive) heart failure: Secondary | ICD-10-CM

## 2022-11-07 NOTE — Telephone Encounter (Signed)
ATC pt and LVM regarding PAP re-enrollment for calendar year 2024. Informed pt that I would be placing renewal application in the mail and requested that they contact me directly if with any questions or concerns. Direct office number provided, will await f/u. Provider portion placed in Dr. Shirlee More box for signature.

## 2022-11-12 ENCOUNTER — Ambulatory Visit (HOSPITAL_COMMUNITY)
Admission: RE | Admit: 2022-11-12 | Discharge: 2022-11-12 | Disposition: A | Discharge status: Home / Self Care | Payer: PPO | Source: Ambulatory Visit | Attending: Cardiology | Admitting: Cardiology

## 2022-11-12 DIAGNOSIS — I5022 Chronic systolic (congestive) heart failure: Secondary | ICD-10-CM | POA: Diagnosis not present

## 2022-11-12 LAB — BASIC METABOLIC PANEL
Anion gap: 8 (ref 5–15)
BUN: 14 mg/dL (ref 8–23)
CO2: 25 mmol/L (ref 22–32)
Calcium: 10 mg/dL (ref 8.9–10.3)
Chloride: 105 mmol/L (ref 98–111)
Creatinine, Ser: 0.96 mg/dL (ref 0.44–1.00)
GFR, Estimated: >60 mL/min (ref >60)
Glucose, Bld: 110 mg/dL — ABNORMAL HIGH (ref 70–99)
Potassium: 4.2 mmol/L (ref 3.5–5.1)
Sodium: 138 mmol/L (ref 135–145)

## 2022-11-12 NOTE — Telephone Encounter (Signed)
Provider portion received, will continue to await return of pt portion. 

## 2022-11-15 ENCOUNTER — Telehealth: Payer: Self-pay | Admitting: Family Medicine

## 2022-11-15 ENCOUNTER — Ambulatory Visit (INDEPENDENT_AMBULATORY_CARE_PROVIDER_SITE_OTHER): Payer: PPO | Admitting: Family Medicine

## 2022-11-15 ENCOUNTER — Encounter: Payer: Self-pay | Admitting: Family Medicine

## 2022-11-15 VITALS — BP 134/68 | HR 83 | Ht 64.0 in | Wt 174.0 lb

## 2022-11-15 DIAGNOSIS — E118 Type 2 diabetes mellitus with unspecified complications: Secondary | ICD-10-CM | POA: Diagnosis not present

## 2022-11-15 DIAGNOSIS — I10 Essential (primary) hypertension: Secondary | ICD-10-CM | POA: Diagnosis not present

## 2022-11-15 NOTE — Telephone Encounter (Signed)
Sent patient a Clinical cytogeneticist message with the Eliquis application, as well as instructions on obtaining proof of her out of pocket spend per requirements of the company.   Will await patient response.    Lynnda Shields, PharmD Clinical Pharmacist [facility] [phone]

## 2022-11-15 NOTE — Progress Notes (Signed)
   Established Patient Office Visit  Subjective   Patient ID: Tara Campbell, female    DOB: September 15, 1946  Age: 76 y.o. MRN: [id number]  Chief Complaint  Patient presents with   Follow-up   Hypertension    HPI  Hypertension- Pt denies chest pain, SOB, dizziness, or heart palpitations.  Taking meds as directed w/o problems.  Denies medication side effects.  Blood pressures have been running from 1 15-1 20 mostly.  DM - blood sugars have been well controlled.    She is applied for patient assistance with her [name].  She is actually been doing really well with it she has had a significant improvement in her breathing.  And in fact has not had to use her rescue inhaler lately at all.  She is also working on trying to get assistance for the Nucynta and her Eliquis.       ROS    Objective:     BP 134/68   Pulse 83   Ht 5\' 4"  (1.626 m)   Wt 174 lb (78.9 kg)   SpO2 99%   BMI 29.87 kg/m    Physical Exam Vitals and nursing note reviewed.  Constitutional:      Appearance: She is well-developed.  HENT:     Head: Normocephalic and atraumatic.  Cardiovascular:     Rate and Rhythm: Normal rate and regular rhythm.     Heart sounds: Normal heart sounds.  Pulmonary:     Effort: Pulmonary effort is normal.     Breath sounds: Normal breath sounds.  Skin:    General: Skin is warm and dry.  Neurological:     Mental Status: She is alert and oriented to person, place, and time.  Psychiatric:        Behavior: Behavior normal.     No results found for any visits on 11/15/22.    The ASCVD Risk score (Arnett DK, et al., 2019) failed to calculate for the following reasons:   The patient has a prior MI or stroke diagnosis    Assessment & Plan:   Problem List Items Addressed This Visit       Cardiovascular and Mediastinum   Hypertension goal BP (blood pressure) < 140/90 - Primary (Chronic)    Well controlled, they look great. Continue current regimen. Follow up in 6  months.       Relevant Medications   irbesartan (AVAPRO) 300 MG tablet     Endocrine   Controlled diabetes mellitus type 2 with complications (HCC) (Chronic)    No Concerns today.  Last A1c was done about 6 weeks ago we will plan to follow back up in 3 months.  Call if any problems or concerns in the interim.      Relevant Medications   irbesartan (AVAPRO) 300 MG tablet    Will reach out to our clinical pharmacist to see if she can help with assistance for the Eliquis.  The [name] would need to really come from the pulmonologist.  And Ranexa typically from cardiology but we are certainly more than willing to be helpful not area if needed.   Return in about 3 months (around 02/14/2023) for Hypertension, Diabetes follow-up.    02/16/2023, MD

## 2022-11-15 NOTE — Telephone Encounter (Signed)
Patient I know it is late in the year but would you be able to reach out to patient about assistance with Eliquis.  I believe she applied last year with you but did not qualify she is also taking [name] and Repatha so it is really putting her into to the donut hole with some high costs medications.

## 2022-11-15 NOTE — Assessment & Plan Note (Addendum)
No Concerns today.  Last A1c was done about 6 weeks ago we will plan to follow back up in 3 months.  Call if any problems or concerns in the interim.

## 2022-11-15 NOTE — Assessment & Plan Note (Addendum)
Well controlled, they look great. Continue current regimen. Follow up in 6 months.

## 2022-11-23 ENCOUNTER — Ambulatory Visit
Admission: EM | Admit: 2022-11-23 | Discharge: 2022-11-23 | Disposition: A | Discharge status: Home / Self Care | Payer: PPO | Attending: Urgent Care | Admitting: Urgent Care

## 2022-11-23 ENCOUNTER — Encounter: Payer: Self-pay | Admitting: Emergency Medicine

## 2022-11-23 DIAGNOSIS — J069 Acute upper respiratory infection, unspecified: Secondary | ICD-10-CM

## 2022-11-23 DIAGNOSIS — R0981 Nasal congestion: Secondary | ICD-10-CM | POA: Diagnosis not present

## 2022-11-23 DIAGNOSIS — J3489 Other specified disorders of nose and nasal sinuses: Secondary | ICD-10-CM | POA: Diagnosis not present

## 2022-11-23 LAB — POCT INFLUENZA A/B
Influenza A, POC: NEGATIVE
Influenza B, POC: NEGATIVE

## 2022-11-23 LAB — POC SARS CORONAVIRUS 2 AG -  ED: SARS Coronavirus 2 Ag: NEGATIVE

## 2022-11-23 MED ORDER — BENZONATATE 100 MG PO CAPS: 100.0000 mg | ORAL_CAPSULE | Freq: Three times a day (TID) | ORAL | 0 refills | Status: DC | PRN

## 2022-11-23 MED ORDER — IPRATROPIUM BROMIDE 0.03 % NA SOLN: 2.0000 | Freq: Two times a day (BID) | NASAL | 0 refills | Status: DC

## 2022-11-23 NOTE — ED Provider Notes (Signed)
[name]    CSN: [id number] Arrival date & time: 11/23/22  1202      History   Chief Complaint Chief Complaint  Patient presents with   Cough    HPI Tara Campbell is a 76 y.o. female.   Pleasant 76yo female presents today due to concerns of viral infection.  States that her nephew had both COVID and flu at the same time last week.  3 days ago, patient reports development of nasal congestion, postnasal drip, and a cough.  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  States she took it today.  States she was unable to sleep laying flat last night due to a cough.  Feels like she has a low-grade fever, but denies myalgias.  Patient took a COVID test yesterday, 24 hours after symptom onset, which was negative.  Patient drank some hot tea last evening which she states helped with her sore throat.   Cough   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   Otomycosis of right ear 09/27/2017   Paroxysmal atrial fibrillation (HCC)    Thyroid disease    Trochanteric bursitis of right hip 05/29/2017    Patient Active Problem List   Diagnosis Date Noted   NSTEMI (non-ST elevated myocardial infarction) (HCC) 09/14/2022   Hypokalemia 09/14/2022   Non-STEMI (non-ST elevated myocardial infarction) (HCC) 09/14/2022   GAD (generalized anxiety disorder) 09/10/2022   Primary osteoarthritis of right knee 08/16/2022   Hypervitaminosis 01/03/2022   Memory impairment 01/03/2022   TIA (transient ischemic attack) 12/26/2021   Osteoarthritis of carpometacarpal (CMC) joint of  right thumb 10/02/2021   Obesity (BMI 30-39.9) 09/06/2021   Angina pectoris (HCC) 06/23/2021   Hyperlipidemia 04/27/2021   History of non-ST elevation myocardial infarction (NSTEMI) 04/25/2021   Hilar adenopathy 10/13/2018   Mediastinal adenopathy 10/13/2018   Dupuytren's contracture of left hand 08/28/2018   Trigger finger, left index finger 08/28/2018   Non-seasonal allergic rhinitis 08/26/2018   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   Controlled diabetes mellitus type 2 with complications (HCC) 12/27/2017   Class 1 obesity due to excess calories with serious comorbidity in adult 12/27/2017   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   Encounter for long-term (current) use of medications 11/28/2017   Anticoagulant long-term use - on eliquis for PAF 10/21/2017   Sensorineural hearing loss (SNHL) of both ears 09/27/2017   Hypertension goal BP (blood pressure) < 140/90 08/29/2017   CAD (coronary artery disease) 08/29/2017   Gout of ankle 04/08/2017   History of myocardial infarct at age greater than 60 years 01/23/2017   Mixed diabetic hyperlipidemia associated with type 2 diabetes mellitus (HCC) 01/23/2017   Acquired hypothyroidism 01/23/2017   Severe persistent allergic asthma 01/16/2017   Paroxysmal atrial fibrillation (HCC) 01/16/2017   Cardiomegaly 01/16/2017    Past Surgical History:  Procedure Laterality Date   ANGIOPLASTY     CARDIAC CATHETERIZATION     COLONOSCOPY W/ POLYPECTOMY  01/04/2015   CORONARY  STENT INTERVENTION N/A 08/31/2022   Procedure: CORONARY STENT INTERVENTION;  Surgeon: Tonny Bollman, MD;  Location: [facility] INVASIVE CV LAB;  Service: Cardiovascular;  Laterality: N/A;   HEMORRHOID BANDING     LEFT HEART CATH AND CORONARY ANGIOGRAPHY N/A 04/26/2021   Procedure: LEFT HEART CATH AND CORONARY ANGIOGRAPHY;  Surgeon: Tonny Bollman, MD;  Location: [facility] INVASIVE CV LAB;  Service: Cardiovascular;  Laterality: N/A;   LEFT HEART CATH AND  CORONARY ANGIOGRAPHY N/A 09/14/2022   Procedure: LEFT HEART CATH AND CORONARY ANGIOGRAPHY;  Surgeon: Tonny Bollman, MD;  Location: [facility] INVASIVE CV LAB;  Service: Cardiovascular;  Laterality: N/A;    OB History   No obstetric history on file.      Home Medications    Prior to Admission medications   Medication Sig Start Date End Date Taking? Authorizing Provider  acetaminophen (TYLENOL) 650 MG CR tablet Take 1 tablet (650 mg total) by mouth every 8 (eight) hours as needed for pain. 10/02/21  Yes Monica Becton, MD  albuterol (VENTOLIN HFA) 108 (90 Base) MCG/ACT inhaler Inhale 2 puffs into the lungs every 6 (six) hours as needed for wheezing. 05/02/22  Yes Parrett, [name] S, NP  allopurinol (ZYLOPRIM) 300 MG tablet Take 1 tablet (300 mg total) by mouth 2 (two) times daily. 10/17/22  Yes Agapito Games, MD  atenolol (TENORMIN) 100 MG tablet Take 1 tablet (100 mg total) by mouth daily. 09/19/22  Yes Agapito Games, MD  atorvastatin (LIPITOR) 80 MG tablet Take 1 tablet (80 mg total) by mouth at bedtime. 04/26/22  Yes Monica Becton, MD  benzonatate (TESSALON PERLES) 100 MG capsule Take 1 capsule (100 mg total) by mouth 3 (three) times daily as needed for cough. 11/23/22  Yes Udell Blasingame L, PA  budesonide-formoterol (SYMBICORT) 80-4.5 MCG/ACT inhaler Inhale 2 puffs into the lungs in the morning and at bedtime. 05/02/22  Yes Parrett, [name] S, NP  clopidogrel (PLAVIX) 75 MG tablet Take 1 tablet (75 mg total) by mouth daily. 08/17/22  Yes Tonny Bollman, MD  cyanocobalamin (VITAMIN B12) 1000 MCG tablet Take 1,000 mcg by mouth daily.   Yes [provider]  ELIQUIS 5 MG TABS tablet Take 1 tablet (5 mg total) by mouth 2 (two) times daily. 10/17/22  Yes Agapito Games, MD  empagliflozin (JARDIANCE) 10 MG TABS tablet Take 1 tablet (10 mg total) by mouth daily before breakfast. 10/01/22  Yes Laurey Morale, MD  EPINEPHrine 0.3 mg/0.3 mL IJ SOAJ injection  Inject 0.3 mg into the muscle as needed for anaphylaxis. Call 9-1-1 after use. 05/02/22  Yes Parrett, [name] S, NP  Evolocumab with Infusor (REPATHA PUSHTRONEX SYSTEM) 420 MG/3.5ML SOCT Inject 420 mg into the skin every 30 (thirty) days. 04/26/22  Yes Monica Becton, MD  ezetimibe (ZETIA) 10 MG tablet Take 1 tablet (10 mg total) by mouth daily. 04/26/22  Yes Monica Becton, MD  fluticasone (FLONASE) 50 MCG/ACT nasal spray Place 1 spray into both nostrils daily. 04/26/22  Yes Monica Becton, MD  glucose blood test strip To be used twice daily for testing blood sugars. E11.65 04/03/21  Yes Agapito Games, MD  ipratropium (ATROVENT) 0.03 % nasal spray Place 2 sprays into both nostrils every 12 (twelve) hours. 11/23/22  Yes Meria Crilly L, PA  ipratropium-albuterol (DUONEB) 0.5-2.5 (3) MG/3ML SOLN Take 3 mLs by nebulization every 6 (six) hours as needed. 05/02/22  Yes Parrett, [name] S, NP  irbesartan (AVAPRO) 300 MG tablet Take 300 mg by mouth  daily. 09/15/22  Yes [provider]  isosorbide mononitrate (IMDUR) 60 MG 24 hr tablet Take 60 mg by mouth 2 (two) times daily.   Yes [provider]  levothyroxine (SYNTHROID) 125 MCG tablet Take 1 tablet (125 mcg total) by mouth daily before breakfast. 09/03/22  Yes Hali Marry, MD  Mepolizumab ([name]) 100 MG/ML SOAJ Inject 1 mL (100 mg total) into the skin every 28 (twenty-eight) days. 12/18/21  Yes Mannam, Praveen, MD  metFORMIN (GLUCOPHAGE) 500 MG tablet Take 2 tablets (1,000 mg total) by mouth 2 (two) times daily with a meal. 04/26/22  Yes Silverio Decamp, MD  mupirocin ointment (BACTROBAN) 2 % Apply to inside of each nares daily for 10 days 10/04/22  Yes Hali Marry, MD  nitroGLYCERIN (NITROSTAT) 0.4 MG SL tablet Place 1 tablet (0.4 mg total) under the tongue every 5 (five) minutes as needed for chest pain (or tightness). 09/19/22  Yes Hali Marry, MD  Omega-3 Fatty Acids (FISH OIL)  1000 MG CAPS Take 1 capsule (1,000 mg total) by mouth daily. 04/26/22  Yes Silverio Decamp, MD  ondansetron (ZOFRAN-ODT) 8 MG disintegrating tablet 8mg  ODT q8 hours prn nausea 09/28/22  Yes Hali Marry, MD  Polyvinyl Alcohol-Povidone (REFRESH OP) Place 1 drop into both eyes daily as needed (dry eyes).   Yes [provider]  ranolazine (RANEXA) 500 MG 12 hr tablet Take 1 tablet (500 mg total) by mouth 2 (two) times daily. 09/21/22  Yes Earnie Larsson, NP  Vitamin D, Cholecalciferol, 25 MCG (1000 UT) TABS Take 25 mcg by mouth daily in the afternoon. 12/27/21  Yes [place] Eric J, DO  zinc gluconate 50 MG tablet Take 50 mg by mouth at bedtime.   Yes [provider]    Family History Family History  Problem Relation Age of Onset   Hypertension Mother    Hyperlipidemia Mother    Alzheimer's disease Mother    Vascular Disease Mother    Glaucoma Father    Heart disease Father    Hypertension Father    Diabetes Father    Hyperlipidemia Father    Skin cancer Father    Diabetes Paternal Grandmother    Colon cancer Neg Hx    Esophageal cancer Neg Hx     Social History Social History   Tobacco Use   Smoking status: Never   Smokeless tobacco: Never  Vaping Use   Vaping Use: Never used  Substance Use Topics   Alcohol use: Yes    Comment: Rare   Drug use: No     Allergies   Sulfamethoxazole, Sertraline, Sulfa antibiotics, and Tape   Review of Systems Review of Systems  Respiratory:  Positive for cough.   As per HPI   Physical Exam Triage Vital Signs ED Triage Vitals  Enc Vitals Group     BP 11/23/22 1250 (!) 167/87     Pulse Rate 11/23/22 1250 (!) 102     Resp 11/23/22 1250 18     Temp 11/23/22 1250 98.3 F (36.8 C)     Temp Source 11/23/22 1250 Oral     SpO2 11/23/22 1250 97 %     Weight 11/23/22 1252 174 lb (78.9 kg)     Height 11/23/22 1252 5\' 4"  (1.626 m)     Head Circumference --      Peak Flow --      Pain Score 11/23/22 1252 0      Pain Loc --  Pain Edu? --      Excl. in [place]? --    No data found.  Updated Vital Signs BP (!) 167/87 (BP Location: Right Arm)   Pulse (!) 102   Temp 98.3 F (36.8 C) (Oral)   Resp 18   Ht 5\' 4"  (1.626 m)   Wt 174 lb (78.9 kg)   SpO2 97%   BMI 29.87 kg/m   Visual Acuity Right Eye Distance:   Left Eye Distance:   Bilateral Distance:    Right Eye Near:   Left Eye Near:    Bilateral Near:     Physical Exam Vitals and nursing note reviewed.  Constitutional:      General: She is not in acute distress.    Appearance: She is well-developed. She is not ill-appearing or toxic-appearing.  HENT:     Head: Normocephalic and atraumatic.     Right Ear: Tympanic membrane, ear canal and external ear normal. There is no impacted cerumen.     Left Ear: Tympanic membrane, ear canal and external ear normal. There is no impacted cerumen.     Nose: Congestion and rhinorrhea (clear) present.     Mouth/Throat:     Mouth: Mucous membranes are moist.     Pharynx: Oropharynx is clear. No oropharyngeal exudate or posterior oropharyngeal erythema.  Eyes:     General: No scleral icterus.       Right eye: No discharge.        Left eye: No discharge.     Extraocular Movements: Extraocular movements intact.     Conjunctiva/sclera: Conjunctivae normal.     Pupils: Pupils are equal, round, and reactive to light.  Cardiovascular:     Rate and Rhythm: Regular rhythm. Tachycardia present.     Pulses: Normal pulses.     Heart sounds: No murmur heard. Pulmonary:     Effort: Pulmonary effort is normal. No accessory muscle usage, respiratory distress or retractions.     Breath sounds: Normal breath sounds and air entry. No stridor, decreased air movement or transmitted upper airway sounds. No decreased breath sounds, wheezing (scant posterior), rhonchi or rales.  Abdominal:     Palpations: Abdomen is soft.     Tenderness: There is no abdominal tenderness.  Musculoskeletal:        General: No  swelling.     Cervical back: Normal range of motion and neck supple. No rigidity or tenderness.  Lymphadenopathy:     Cervical: No cervical adenopathy.  Skin:    General: Skin is warm and dry.     Capillary Refill: Capillary refill takes less than 2 seconds.     Coloration: Skin is not jaundiced.     Findings: No bruising, erythema or rash.  Neurological:     General: No focal deficit present.     Mental Status: She is alert and oriented to person, place, and time.  Psychiatric:        Mood and Affect: Mood normal.      UC Treatments / Results  Labs (all labs ordered are listed, but only abnormal results are displayed) Labs Reviewed  POC SARS CORONAVIRUS 2 AG -  ED  POCT INFLUENZA A/B    EKG   Radiology No results found.  Procedures Procedures (including critical care time)  Medications Ordered in UC Medications - No data to display  Initial Impression / Assessment and Plan / UC Course  I have reviewed the triage vital signs and the nursing notes.  Pertinent labs &  imaging results that were available during my care of the patient were reviewed by me and considered in my medical decision making (see chart for details).     Nasal congestion with rhinorrhea -symptomatic support with nasal spray appears appropriate at this time.  Will try Atrovent nasal, Mucinex to help break up secretions.  Viral URI with cough -COVID and flu testing negative.  Does not appear bacterial in nature.  Patient to continue her home pulmonary medications, Mucinex to help break up any phlegm, Atrovent nasal and Tessalon Perles as needed.  Return to clinic precautions reviewed.   Final Clinical Impressions(s) / UC Diagnoses   Final diagnoses:  Nasal congestion with rhinorrhea  Viral upper respiratory tract infection with cough     Discharge Instructions      Your COVID and flu test are negative. Your symptoms are consistent with a viral illness. Increase your water intake and  REST! Use tylenol to help with fevers and body aches. I have called in a medication to help with the cough at night.  Please use mucinex twice daily. Do not use sudafed. Use the nasal spray called in today to help with nasal congestion. RTC for any new or worsening symptoms.      ED Prescriptions     Medication Sig Dispense Auth. Provider   benzonatate (TESSALON PERLES) 100 MG capsule Take 1 capsule (100 mg total) by mouth 3 (three) times daily as needed for cough. 20 capsule Julionna Marczak L, PA   ipratropium (ATROVENT) 0.03 % nasal spray Place 2 sprays into both nostrils every 12 (twelve) hours. 30 mL Nori Poland L, PA      PDMP not reviewed this encounter.   Chaney Malling, [place] 11/23/22 1442

## 2022-11-23 NOTE — Discharge Instructions (Signed)
Your COVID and flu test are negative. Your symptoms are consistent with a viral illness. Increase your water intake and REST! Use tylenol to help with fevers and body aches. I have called in a medication to help with the cough at night.  Please use mucinex twice daily. Do not use sudafed. Use the nasal spray called in today to help with nasal congestion. RTC for any new or worsening symptoms.

## 2022-11-23 NOTE — ED Triage Notes (Signed)
Patient c/o cough, congestion, nasal drainage, stuffy head x 3 days.  Patient's grandson diagnosed with COVID and FLU.  Home COVID test was negative.  Patient has taken Mucinex.

## 2022-11-26 ENCOUNTER — Encounter: Payer: Self-pay | Admitting: Family Medicine

## 2022-11-26 DIAGNOSIS — R059 Cough, unspecified: Secondary | ICD-10-CM | POA: Diagnosis not present

## 2022-11-26 DIAGNOSIS — J9811 Atelectasis: Secondary | ICD-10-CM | POA: Diagnosis not present

## 2022-11-26 DIAGNOSIS — R0602 Shortness of breath: Secondary | ICD-10-CM | POA: Diagnosis not present

## 2022-11-26 DIAGNOSIS — R062 Wheezing: Secondary | ICD-10-CM | POA: Diagnosis not present

## 2022-12-04 ENCOUNTER — Encounter: Payer: Self-pay | Admitting: Family Medicine

## 2022-12-04 ENCOUNTER — Ambulatory Visit (INDEPENDENT_AMBULATORY_CARE_PROVIDER_SITE_OTHER): Payer: PPO | Admitting: Family Medicine

## 2022-12-04 VITALS — BP 124/48 | HR 73 | Temp 97.5°F | Ht 64.0 in | Wt 171.0 lb

## 2022-12-04 DIAGNOSIS — J209 Acute bronchitis, unspecified: Secondary | ICD-10-CM | POA: Diagnosis not present

## 2022-12-04 DIAGNOSIS — I10 Essential (primary) hypertension: Secondary | ICD-10-CM

## 2022-12-04 NOTE — Progress Notes (Signed)
   Established Patient Office Visit  Subjective   Patient ID: Tara Campbell, female    DOB: [birth year]  Age: 77 y.o. MRN: [id number]  Chief Complaint  Patient presents with   Cough    HPI  She was seen initially at urgent care on December 22 for cough runny nose and congestion.  She had been around her nephew who tested positive for COVID and flu simultaneously.  But she herself tested negative.  She was treated for viral illness she returned [facility]  Chest x-ray was negative.  Discharged home on doxycycline.  She says she is feeling about 75% better.  Still some cough during the day but the over-the-counter cough medicine seems to be helping some but she knows it is probably driving her blood pressure.  Viewed urgent care and ED notes.     ROS    Objective:     BP (!) 124/48   Pulse 73   Temp (!) 97.5 F (36.4 C) (Oral)   Ht 5\' 4"  (1.626 m)   Wt 171 lb (77.6 kg)   SpO2 97%   BMI 29.35 kg/m     Physical Exam Vitals and nursing note reviewed.  Constitutional:      Appearance: She is well-developed.  HENT:     Head: Normocephalic and atraumatic.  Cardiovascular:     Rate and Rhythm: Normal rate and regular rhythm.     Heart sounds: Normal heart sounds.  Pulmonary:     Effort: Pulmonary effort is normal.     Breath sounds: Normal breath sounds.  Skin:    General: Skin is warm and dry.  Neurological:     Mental Status: She is alert and oriented to person, place, and time.  Psychiatric:        Behavior: Behavior normal.      No results found for any visits on 12/04/22.     The ASCVD Risk score (Arnett DK, et al., 2019) failed to calculate for the following reasons:   The patient has a prior MI or stroke diagnosis    Assessment & Plan:   Problem List Items Addressed This Visit       Cardiovascular and Mediastinum   Hypertension goal BP (blood pressure) < 140/90 (Chronic)    Repeat blood pressure  before she leaves today.  Did encourage her to start checking it again at home when she is off of the cough syrup hopefully by the end of the week.  And we will see if it is getting back to goal.  Repeat blood pressure looks phenomenal today.      Other Visit Diagnoses     Acute bronchitis, unspecified organism    -  Primary      Acute bronchitis-again feeling much better about 75% improved.  Okay to continue conservative treatment.  Albuterol as needed.  I spent 25 minutes on the day of the encounter to include pre-visit record review, face-to-face time with the patient and post visit ordering of test.   Return if symptoms worsen or fail to improve.    Beatrice Lecher, MD

## 2022-12-04 NOTE — Assessment & Plan Note (Addendum)
Repeat blood pressure before she leaves today.  Did encourage her to start checking it again at home when she is off of the cough syrup hopefully by the end of the week.  And we will see if it is getting back to goal.  Repeat blood pressure looks phenomenal today.

## 2022-12-05 NOTE — Progress Notes (Signed)
Urine is negative for protein which is good.

## 2022-12-06 LAB — BASIC METABOLIC PANEL WITH GFR

## 2022-12-06 LAB — MICROALBUMIN / CREATININE URINE RATIO
Creatinine, Urine: 44 mg/dL (ref 20–275)
Microalb Creat Ratio: 7 mcg/mg creat (ref <30)
Microalb, Ur: 0.3 mg/dL

## 2022-12-31 ENCOUNTER — Telehealth: Payer: Self-pay | Admitting: Family Medicine

## 2022-12-31 ENCOUNTER — Encounter: Payer: Self-pay | Admitting: Family Medicine

## 2022-12-31 NOTE — Telephone Encounter (Signed)
Pt dropped of paperwork to be signed by Dr.Metheney. Paperwork left in Metheney's box.

## 2023-01-04 NOTE — Telephone Encounter (Signed)
DMV form completed and placed in Tara Campbell B box

## 2023-01-04 NOTE — Telephone Encounter (Signed)
Pt advised. Form up front.

## 2023-01-17 ENCOUNTER — Ambulatory Visit (INDEPENDENT_AMBULATORY_CARE_PROVIDER_SITE_OTHER): Payer: PPO

## 2023-01-17 ENCOUNTER — Ambulatory Visit (INDEPENDENT_AMBULATORY_CARE_PROVIDER_SITE_OTHER): Payer: PPO | Admitting: Sports Medicine

## 2023-01-17 ENCOUNTER — Encounter: Payer: Self-pay | Admitting: Sports Medicine

## 2023-01-17 VITALS — BP 170/84 | HR 86

## 2023-01-17 DIAGNOSIS — R052 Subacute cough: Secondary | ICD-10-CM | POA: Diagnosis not present

## 2023-01-17 DIAGNOSIS — R059 Cough, unspecified: Secondary | ICD-10-CM | POA: Diagnosis not present

## 2023-01-17 MED ORDER — HYDROCOD POLI-CHLORPHE POLI ER 10-8 MG/5ML PO SUER: 5.0000 mL | Freq: Two times a day (BID) | ORAL | 0 refills | Status: DC | PRN

## 2023-01-17 MED ORDER — BENZONATATE 200 MG PO CAPS: 200.0000 mg | ORAL_CAPSULE | Freq: Three times a day (TID) | ORAL | 0 refills | Status: DC | PRN

## 2023-01-17 MED ORDER — AZITHROMYCIN 250 MG PO TABS: ORAL_TABLET | ORAL | 0 refills | Status: DC

## 2023-01-17 MED ORDER — PREDNISONE 50 MG PO TABS: 50.0000 mg | ORAL_TABLET | Freq: Every day | ORAL | 0 refills | Status: DC

## 2023-01-17 NOTE — Assessment & Plan Note (Signed)
This is a very pleasant 77 year old female with history of diabetes and CAD, approximately a month and a half ago she was seen in the emergency department for a cough, ultimately treated with doxycycline, a chest x-ray showed a left lower lobe atelectasis. She got better, then approximately a week and a half ago she developed increasing cough, facial pain and pressure. Her head and neck exam is unrevealing today but she does have significant diffuse inspiratory and expiratory wheezing throughout both lung fields. Will do a course of prednisone, azithromycin, I would like another chest x-ray to ensure the left lower lobe atelectasis has resolved, if not resolved we will consider a CT of her chest. Tessalon Perles and Tussionex for cough. Return to see me in 2 weeks.

## 2023-01-17 NOTE — Progress Notes (Signed)
    Procedures performed today:    None.  Independent interpretation of notes and tests performed by another provider:   None.  Brief History, Exam, Impression, and Recommendations:    Coughing This is a very pleasant 77 year old female with history of diabetes and CAD, approximately a month and a half ago she was seen in the emergency department for a cough, ultimately treated with doxycycline, a chest x-ray showed a left lower lobe atelectasis. She got better, then approximately a week and a half ago she developed increasing cough, facial pain and pressure. Her head and neck exam is unrevealing today but she does have significant diffuse inspiratory and expiratory wheezing throughout both lung fields. Will do a course of prednisone, azithromycin, I would like another chest x-ray to ensure the left lower lobe atelectasis has resolved, if not resolved we will consider a CT of her chest. Tessalon Perles and Tussionex for cough. Return to see me in 2 weeks.    ____________________________________________ Gwen Her. Dianah Field, M.D., ABFM., CAQSM., AME. Primary Care and Sports Medicine [facility]  Adjunct Professor of [employer] [facility] [employer] [facility] [employer]  Risk manager

## 2023-01-23 NOTE — Telephone Encounter (Signed)
Spoke with patient regarding [name] PAP renewal. She states she has been sick since Christmas but saw paperwork today. She will plan to complete paperwork and mail back to clinic. Will await return of docs from pt  Knox Saliva, PharmD, MPH, BCPS, CPP Clinical Pharmacist (Rheumatology and Pulmonology)

## 2023-01-28 IMAGING — DX DG HAND COMPLETE 3+V*R*
3 series · 3 of 3 positions shown · non-contrast
Comparison: None.

CLINICAL DATA: Right first CMC pain, palpable mass

EXAM:
RIGHT HAND - COMPLETE 3+ VIEW

[hand pa]
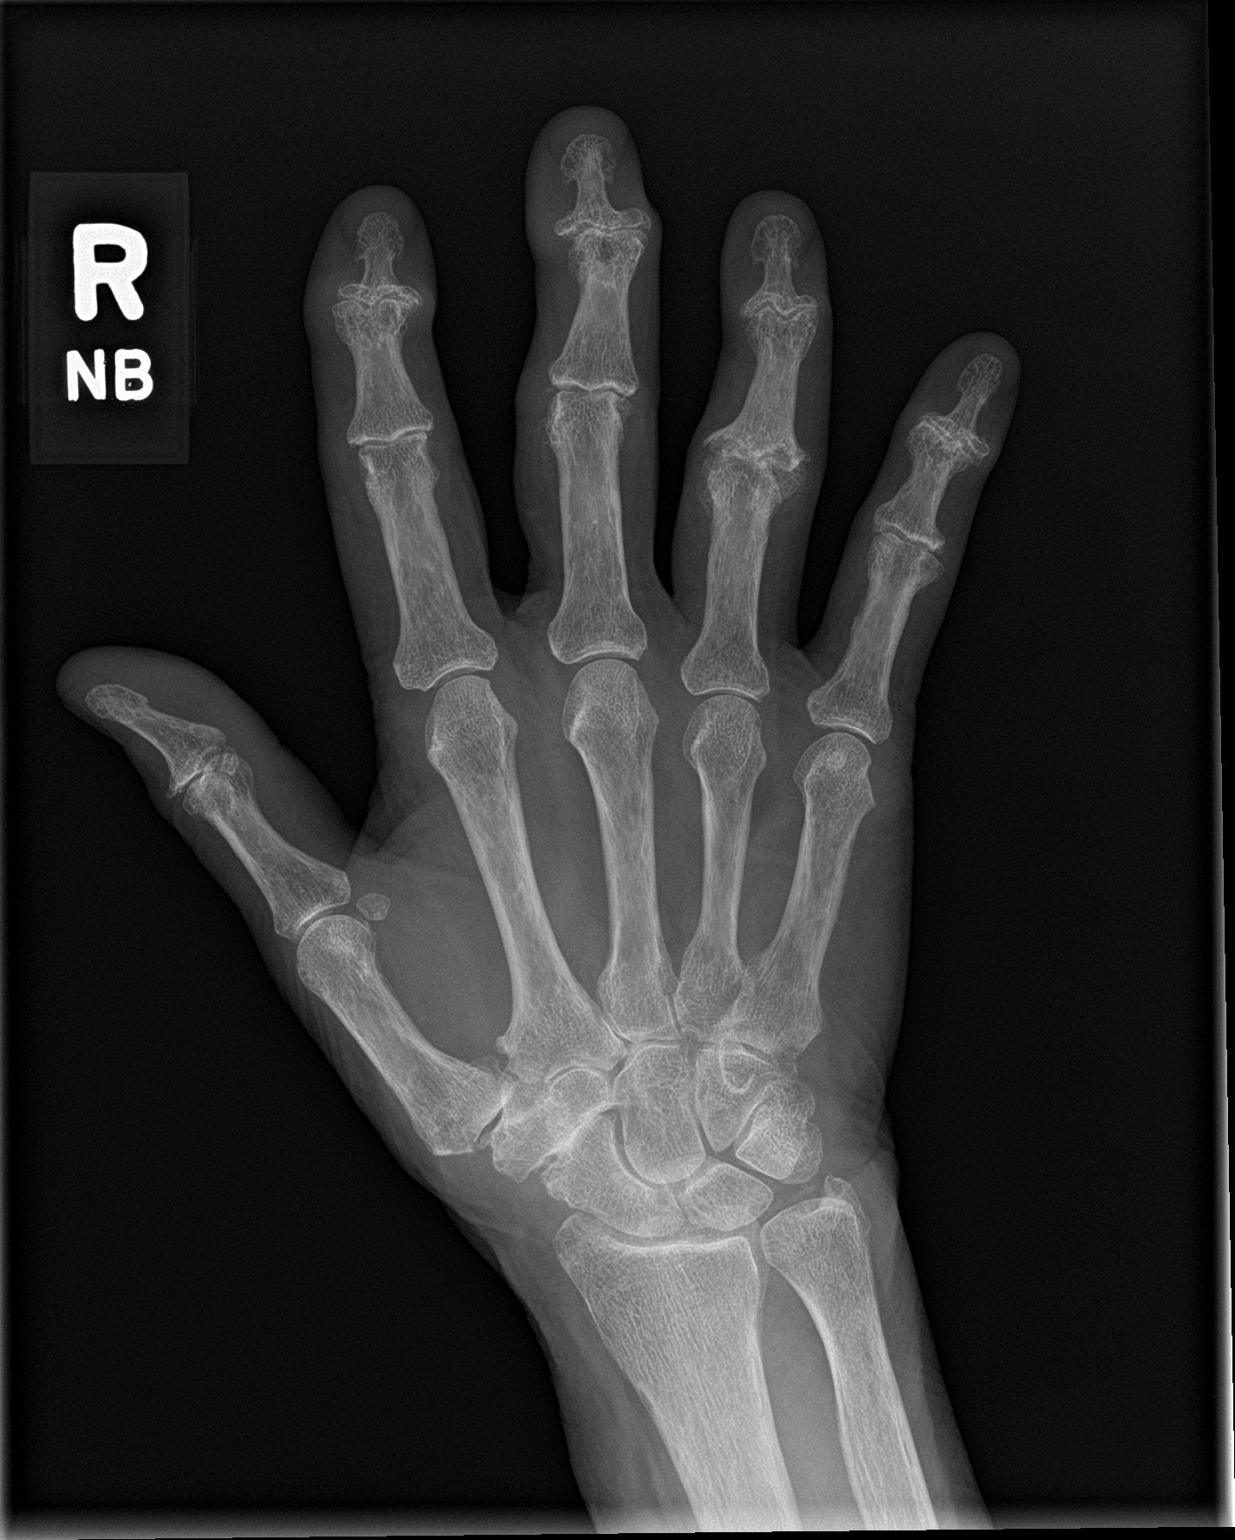

[hand obl]
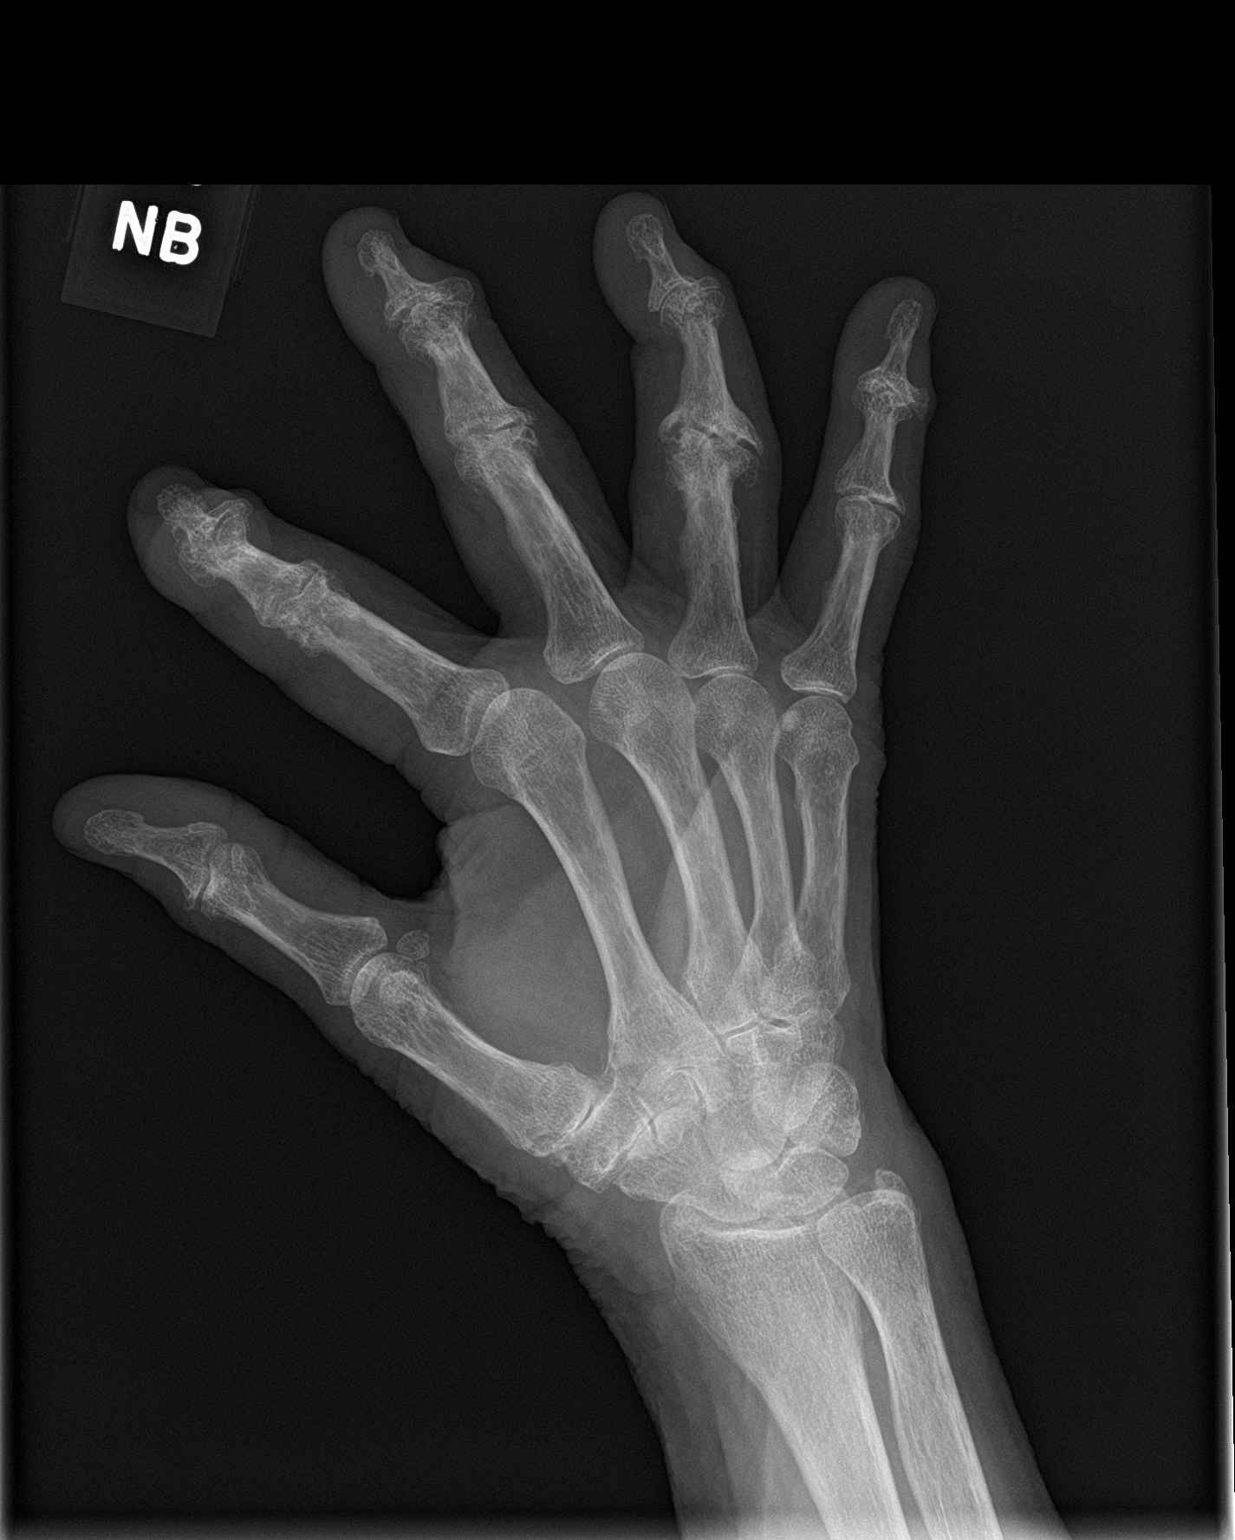

[hand lat]
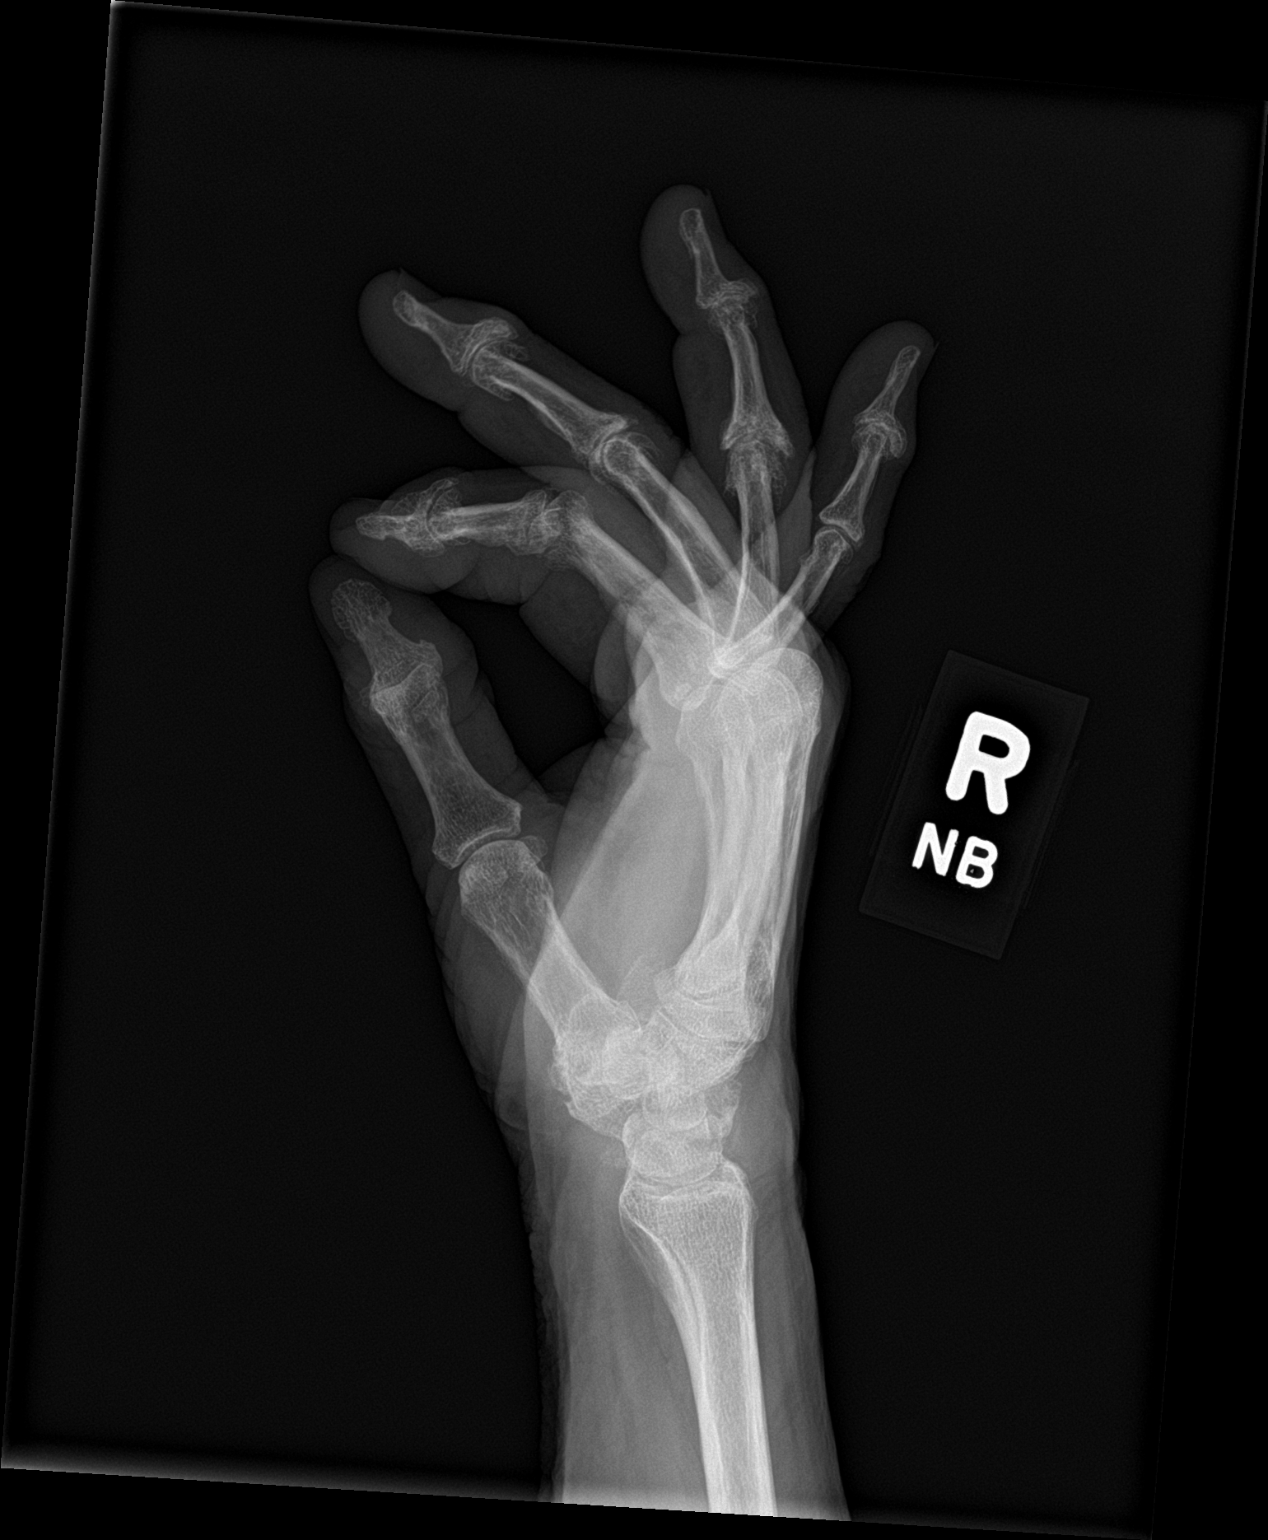

[3 of 3 positions shown; findings below may reference images not displayed]

FINDINGS: Normal alignment. No acute fracture or dislocation. Severe
degenerative arthritis involving the DIP and PIP joints of the
second through fifth digits,. Mild to moderate degenerate arthritis
involving the interphalangeal joint of the thumb, as well as the
first carpometacarpal joint and triscaphe joint at the base of the
thumb, most severe at the triscaphe joint. Remaining joint spaces
are preserved. Soft tissues are unremarkable.
IMPRESSION: Polyarticular osteoarthritis as described above.

## 2023-01-30 ENCOUNTER — Encounter: Payer: Self-pay | Admitting: Family Medicine

## 2023-01-30 DIAGNOSIS — Z0184 Encounter for antibody response examination: Secondary | ICD-10-CM

## 2023-01-31 LAB — MEASLES/MUMPS/RUBELLA IMMUNITY
Mumps IgG: 138 AU/mL
Rubella: 22.4 Index
Rubeola IgG: >300 AU/mL

## 2023-02-01 ENCOUNTER — Ambulatory Visit: Payer: PPO | Admitting: Family Medicine

## 2023-02-01 NOTE — Progress Notes (Signed)
Tara Campbell, you are immune to measles mumps and rubella.  So you are good to go no worries.  I would like for you to return at some point for nurse visit to recheck your blood pressure since the last when here was extremely elevated.

## 2023-02-04 NOTE — Telephone Encounter (Signed)
Called pt regarding [name] PAP renewal application. Pt states she forgot to complete the application. She will complete this week and mail back ATTN: pharmacy team. She states she will definitely try to mail back this week as she is going out of town this week  Knox Saliva, PharmD, MPH, BCPS, CPP Clinical Pharmacist (Rheumatology and Pulmonology)

## 2023-02-14 ENCOUNTER — Ambulatory Visit: Payer: PPO | Admitting: Family Medicine

## 2023-02-14 NOTE — Progress Notes (Deleted)
   Established Patient Office Visit  Subjective   Patient ID: Tara Campbell, female    DOB: 11-12-1946  Age: 77 y.o. MRN: [id number]  No chief complaint on file.   HPI   Hypertension- Pt denies chest pain, SOB, dizziness, or heart palpitations.  Taking meds as directed w/o problems.  Denies medication side effects.    Diabetes - no hypoglycemic events. No wounds or sores that are not healing well. No increased thirst or urination. Checking glucose at home. Taking medications as prescribed without any side effects.  Hypothyroidism - Taking medication regularly in the AM away from food and vitamins, etc. No recent change to skin, hair, or energy levels.  {History (Optional):23778}  ROS    Objective:     There were no vitals taken for this visit. {Vitals History (Optional):23777}  Physical Exam Vitals and nursing note reviewed.  Constitutional:      Appearance: She is well-developed.  HENT:     Head: Normocephalic and atraumatic.  Cardiovascular:     Rate and Rhythm: Normal rate and regular rhythm.     Heart sounds: Normal heart sounds.  Pulmonary:     Effort: Pulmonary effort is normal.     Breath sounds: Normal breath sounds.  Skin:    General: Skin is warm and dry.  Neurological:     Mental Status: She is alert and oriented to person, place, and time.  Psychiatric:        Behavior: Behavior normal.    No results found for any visits on 02/14/23.  {Labs (Optional):23779}  The ASCVD Risk score (Arnett DK, et al., 2019) failed to calculate for the following reasons:   The patient has a prior MI or stroke diagnosis    Assessment & Plan:   Problem List Items Addressed This Visit       Cardiovascular and Mediastinum   Hypertension goal BP (blood pressure) < 140/90 - Primary (Chronic)     Endocrine   Type 2 diabetes mellitus with diabetic chronic kidney disease ([place])   Acquired hypothyroidism    No follow-ups on file.    Beatrice Lecher, MD

## 2023-02-20 NOTE — Telephone Encounter (Signed)
Called patient about [name] PAP application. She states she will complete and mail back this week. She states she had to go out of town for emergency last week  Knox Saliva, PharmD, MPH, BCPS, CPP Clinical Pharmacist (Rheumatology and Pulmonology)

## 2023-02-21 NOTE — Telephone Encounter (Signed)
Received patient portion of [name] PAP application. Submitted Patient Assistance Application to [place] [facility] [employer] for [place] along with provider portion, patient portion, med list, insurance card copy. Will update patient when we receive a response.  Phone #: [phone] Fax #: [phone]   Knox Saliva, PharmD, MPH, BCPS, CPP Clinical Pharmacist (Rheumatology and Pulmonology)

## 2023-02-25 ENCOUNTER — Other Ambulatory Visit (HOSPITAL_COMMUNITY): Payer: Self-pay

## 2023-02-25 NOTE — Telephone Encounter (Signed)
Received fax from [employer] stating that a prior authorization approval is required for PAP determination. Pt has a HTA plan which historically auto-renews PA's for maintenance medication and does not provide approval letters after having done so.   Obtained screenshot from [facility] stating that PA request is "duplicate/approved", as well as claim info provided from test claim. Faxed back to [employer], will continue awaiting determination.

## 2023-02-28 NOTE — Telephone Encounter (Signed)
Received a fax from  [place] to [place] regarding an approval for [place] patient assistance from 02/26/23 to 12/03/23. Approval letter sent to scan center.  MyChart message sent to patient. Phone #: [phone] Fax #: [phone]  Knox Saliva, PharmD, MPH, BCPS, CPP Clinical Pharmacist (Rheumatology and Pulmonology)

## 2023-03-05 ENCOUNTER — Encounter: Payer: Self-pay | Admitting: Family Medicine

## 2023-03-05 ENCOUNTER — Ambulatory Visit (INDEPENDENT_AMBULATORY_CARE_PROVIDER_SITE_OTHER): Payer: PPO | Admitting: Family Medicine

## 2023-03-05 VITALS — BP 99/44 | HR 71 | Resp 18 | Ht 64.0 in | Wt 183.2 lb

## 2023-03-05 DIAGNOSIS — G8929 Other chronic pain: Secondary | ICD-10-CM

## 2023-03-05 DIAGNOSIS — I10 Essential (primary) hypertension: Secondary | ICD-10-CM

## 2023-03-05 DIAGNOSIS — M25512 Pain in left shoulder: Secondary | ICD-10-CM

## 2023-03-05 DIAGNOSIS — N1831 Chronic kidney disease, stage 3a: Secondary | ICD-10-CM | POA: Diagnosis not present

## 2023-03-05 DIAGNOSIS — I480 Paroxysmal atrial fibrillation: Secondary | ICD-10-CM

## 2023-03-05 DIAGNOSIS — E1122 Type 2 diabetes mellitus with diabetic chronic kidney disease: Secondary | ICD-10-CM | POA: Diagnosis not present

## 2023-03-05 DIAGNOSIS — E039 Hypothyroidism, unspecified: Secondary | ICD-10-CM

## 2023-03-05 LAB — POCT GLYCOSYLATED HEMOGLOBIN (HGB A1C): Hemoglobin A1C: 6.3 % — AB (ref 4.0–5.6)

## 2023-03-05 MED ORDER — APIXABAN 5 MG PO TABS
5.0000 mg | ORAL_TABLET | Freq: Two times a day (BID) | ORAL | 3 refills | Status: DC
Start: 2023-03-05 — End: 2024-07-17

## 2023-03-05 MED ORDER — EZETIMIBE 10 MG PO TABS: 10.0000 mg | ORAL_TABLET | Freq: Every day | ORAL | 3 refills | Status: DC

## 2023-03-05 MED ORDER — METFORMIN HCL 500 MG PO TABS: 1000.0000 mg | ORAL_TABLET | Freq: Two times a day (BID) | ORAL | 3 refills | Status: DC

## 2023-03-05 NOTE — Progress Notes (Signed)
Established Patient Office Visit  Subjective   Patient ID: Tara Campbell, female    DOB: 09-05-1946  Age: 77 y.o. MRN: [id number]  Chief Complaint  Patient presents with   Follow-up    Patient is here for her 3 month follow up, for Hypertension and DM.     HPI  Hypertension- Pt denies chest pain, SOB, dizziness, or heart palpitations.  Taking meds as directed w/o problems.  Denies medication side effects.    Diabetes - no hypoglycemic events. No wounds or sores that are not healing well. No increased thirst or urination. Checking glucose at home. Taking medications as prescribed without any side effects.  He also reports that she still struggling with some shoulder pain she says she has a known partial rotator cuff tear as well as some arthritis and degeneration it has been an issue really since 2013 but is just getting more bothersome.  He also wants to start walking but does have some problems with her right hip and her right knee so wants to be cautious and careful about it.    ROS    Objective:     BP (!) 99/44   Pulse 71   Resp 18   Ht 5\' 4"  (1.626 m)   Wt 183 lb 3.2 oz (83.1 kg)   SpO2 97%   BMI 31.45 kg/m    Physical Exam Vitals and nursing note reviewed.  Constitutional:      Appearance: She is well-developed.  HENT:     Head: Normocephalic and atraumatic.  Cardiovascular:     Rate and Rhythm: Normal rate and regular rhythm.     Heart sounds: Normal heart sounds.  Pulmonary:     Effort: Pulmonary effort is normal.     Breath sounds: Normal breath sounds.  Skin:    General: Skin is warm and dry.  Neurological:     Mental Status: She is alert and oriented to person, place, and time.  Psychiatric:        Behavior: Behavior normal.      Results for orders placed or performed in visit on 03/05/23  POCT HgB A1C  Result Value Ref Range   Hemoglobin A1C 6.3 (A) 4.0 - 5.6 %   HbA1c POC (<> result, manual entry)     HbA1c, POC (prediabetic range)      HbA1c, POC (controlled diabetic range)        The ASCVD Risk score (Arnett DK, et al., 2019) failed to calculate for the following reasons:   The patient has a prior MI or stroke diagnosis    Assessment & Plan:   Problem List Items Addressed This Visit       Cardiovascular and Mediastinum   Paroxysmal atrial fibrillation (Chronic)   Relevant Medications   apixaban (ELIQUIS) 5 MG TABS tablet   ezetimibe (ZETIA) 10 MG tablet   Hypertension goal BP (blood pressure) < 140/90 (Chronic)    Pressure looks great.  Cardiology would prefer her for her to be on the low end.  She is doing well.  For updated labs.      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  Just encouraged her to get back on track with healthy diet regular exercise and to try to get her weight back down she did gain a little bit over the winter months.  Follow-up in 3 months.      Relevant Medications   metFORMIN (GLUCOPHAGE) 500 MG tablet   Other Relevant Orders   Lipid Panel w/reflex Direct LDL   TSH   COMPLETE METABOLIC PANEL WITH GFR   POCT HgB A1C (Completed)   Acquired hypothyroidism   Relevant Orders   TSH   Other Visit Diagnoses     Chronic left shoulder pain       Relevant Orders   Ambulatory referral to Physical Therapy       Right Hip and right knee pain-I do encourage her to walk to start with short distance.  Make sure it is on a flat surface and avoid inclines.  Encouraged her to make sure that she has good supportive shoe wear and see she how she feels the next day.   Left shoulder pain-she is not interested in any type of surgical correction of the issue.  We  discussed doing formal physical therapy to improve pain and range of motion.   Return in about 3 months (around 06/04/2023) for Diabetes follow-up.    Beatrice Lecher, MD

## 2023-03-05 NOTE — Assessment & Plan Note (Signed)
A1c up from previous but still looks great at 6.3.  Just encouraged her to get back on track with healthy diet regular exercise and to try to get her weight back down she did gain a little bit over the winter months.  Follow-up in 3 months.

## 2023-03-05 NOTE — Assessment & Plan Note (Signed)
Pressure looks great.  Cardiology would prefer her for her to be on the low end.  She is doing well.  For updated labs.

## 2023-03-07 ENCOUNTER — Encounter: Payer: Self-pay | Admitting: Rehabilitative and Restorative Service Providers"

## 2023-03-07 ENCOUNTER — Other Ambulatory Visit: Payer: Self-pay

## 2023-03-07 ENCOUNTER — Ambulatory Visit: Payer: PPO | Attending: Family Medicine | Admitting: Rehabilitative and Restorative Service Providers"

## 2023-03-07 DIAGNOSIS — M6281 Muscle weakness (generalized): Secondary | ICD-10-CM | POA: Insufficient documentation

## 2023-03-07 DIAGNOSIS — G8929 Other chronic pain: Secondary | ICD-10-CM | POA: Diagnosis not present

## 2023-03-07 DIAGNOSIS — M25512 Pain in left shoulder: Secondary | ICD-10-CM | POA: Diagnosis not present

## 2023-03-07 DIAGNOSIS — R29898 Other symptoms and signs involving the musculoskeletal system: Secondary | ICD-10-CM

## 2023-03-07 DIAGNOSIS — R293 Abnormal posture: Secondary | ICD-10-CM | POA: Insufficient documentation

## 2023-03-07 NOTE — Therapy (Signed)
OUTPATIENT PHYSICAL THERAPY SHOULDER EVALUATION   Patient Name: Tara Campbell MRN: [id number] DOB:10-06-1946, 77 y.o., female Today's Date: 03/07/2023  END OF SESSION:   Past Medical History:  Diagnosis Date   Allergy    Arthritis    Asthma    Colon polyps    Coronary artery disease    Diabetes mellitus without complication    Gout    Heart attack 07/30/2016   PCI, 2 stents   Hyperlipidemia    Hypertension    Internal hemorrhoids    Left rotator cuff tear    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   Otomycosis of right ear 09/27/2017   Paroxysmal atrial fibrillation    Thyroid disease    Trochanteric bursitis of right hip 05/29/2017   Past Surgical History:  Procedure Laterality Date   ANGIOPLASTY     CARDIAC CATHETERIZATION     COLONOSCOPY W/ POLYPECTOMY  01/04/2015   CORONARY STENT INTERVENTION N/A 08/31/2022   Procedure: CORONARY STENT INTERVENTION;  Surgeon: Sherren Mocha, MD;  Location: [facility];  Service: Cardiovascular;  Laterality: N/A;   HEMORRHOID BANDING     LEFT HEART CATH AND CORONARY ANGIOGRAPHY N/A 04/26/2021   Procedure: LEFT HEART CATH AND CORONARY ANGIOGRAPHY;  Surgeon: Sherren Mocha, MD;  Location: [place];  Service: Cardiovascular;  Laterality: N/A;   LEFT HEART CATH AND CORONARY ANGIOGRAPHY N/A 09/14/2022   Procedure: LEFT HEART CATH AND CORONARY ANGIOGRAPHY;  Surgeon: Sherren Mocha, MD;  Location: [facility];  Service: Cardiovascular;  Laterality: N/A;   Patient Active Problem List   Diagnosis Date Noted   Coughing 01/17/2023   NSTEMI (non-ST elevated myocardial infarction) 09/14/2022   Hypokalemia 09/14/2022   Non-STEMI (non-ST elevated myocardial infarction) 09/14/2022   GAD (generalized anxiety disorder) 09/10/2022   Primary osteoarthritis of right knee 08/16/2022   Hypervitaminosis 01/03/2022   Memory impairment 01/03/2022   TIA (transient ischemic attack) 12/26/2021   Osteoarthritis of carpometacarpal ([place])  joint of right thumb 10/02/2021   Obesity (BMI 30-39.9) 09/06/2021   Angina pectoris 06/23/2021   Hyperlipidemia 04/27/2021   History of non-ST elevation myocardial infarction (NSTEMI) 04/25/2021   Hilar adenopathy 10/13/2018   Mediastinal adenopathy 10/13/2018   Dupuytren's contracture of left hand 08/28/2018   Trigger finger, left index finger 08/28/2018   Non-seasonal allergic rhinitis 08/26/2018   Type 2 diabetes mellitus with diabetic chronic kidney disease 08/06/2018   Controlled diabetes mellitus type 2 with complications [id number]   Class 1 obesity due to excess calories with serious comorbidity in adult 12/27/2017   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   Encounter for long-term (current) use of medications 11/28/2017   Anticoagulant long-term use - on eliquis for PAF 10/21/2017   Sensorineural hearing loss (SNHL) of both ears 09/27/2017   Hypertension goal BP (blood pressure) < 140/90 08/29/2017   CAD (coronary artery disease) 08/29/2017   Gout of ankle 04/08/2017   History of myocardial infarct at age greater than 21 years 01/23/2017   Mixed diabetic hyperlipidemia associated with type 2 diabetes mellitus 01/23/2017   Acquired hypothyroidism 01/23/2017   Severe persistent allergic asthma 01/16/2017   Paroxysmal atrial fibrillation 01/16/2017   Cardiomegaly 01/16/2017    PCP: Dr Beatrice Lecher  REFERRING PROVIDER: Dr Beatrice Lecher  REFERRING DIAG: Chronic Lt shoulder pain   THERAPY DIAG:  No diagnosis found.  Rationale for Evaluation and Treatment: Rehabilitation  ONSET DATE: 01/31/22   SUBJECTIVE:  SUBJECTIVE STATEMENT: Patient reports that she has had Lt shoulder pain for years. She was diagnosed with degenerative changes in the Lt shoulder and rotator cuff changes ~ 8 years ago.  Symptoms have increased in the past year.  Hand dominance: Right  PERTINENT HISTORY: HTN; MI x 2 in the year; Lt shoulder pain; arthritic hip and knee  PAIN:  Are you having pain? Yes: NPRS scale: 4-5/10 Pain location: Lt shoulder  Pain description: burning  Aggravating factors: worse at night; lifting arm or moving the arm; putting on clothes  Relieving factors: tylenol   PRECAUTIONS: None  WEIGHT BEARING RESTRICTIONS: No  FALLS:  Has patient fallen in last 6 months? No  LIVING ENVIRONMENT: Lives with: lives with their family and lives alone Lives in: House/apartment  OCCUPATION: Retired   PLOF: Independent  PATIENT GOALS:decrease Lt shoulder pain and gain strength in Lt arm   NEXT MD VISIT: 06/07/23  OBJECTIVE:   DIAGNOSTIC FINDINGS:  None available for shoulder   PATIENT SURVEYS:  FOTO 50 goal 60  COGNITION: Overall cognitive status: Within functional limits for tasks assessed     SENSATION: WFL  POSTURE: Patient presents with head forward posture with increased thoracic kyphosis; shoulders rounded and elevated; scapulae abducted and rotated along the thoracic spine; head of the humerus anterior in orientation.   UPPER EXTREMITY ROM:   Active ROM Right eval Left eval  Shoulder flexion 139 100  Shoulder extension 67 37  Shoulder abduction 138 70  Shoulder adduction    Shoulder internal rotation Thumb T9 Hand to Lt lateral sacrum  Shoulder external rotation 51 8  Elbow flexion    Elbow extension    Wrist flexion    Wrist extension    Wrist ulnar deviation    Wrist radial deviation    Wrist pronation    Wrist supination    (Blank rows = not tested)  UPPER EXTREMITY MMT: Lt UE strength assessed UE at side; unable to assess in MMT positions   MMT Right eval Left eval  Shoulder flexion 4 3-  Shoulder extension 5 4  Shoulder abduction 4 2+  Shoulder adduction    Shoulder internal rotation 5 2+  Shoulder external rotation 4 2+  Middle  trapezius 4 3-  Lower trapezius 4 3-  Elbow flexion    Elbow extension    Wrist flexion    Wrist extension    Wrist ulnar deviation    Wrist radial deviation    Wrist pronation    Wrist supination    Grip strength (lbs)    (Blank rows = not tested)  JOINT MOBILITY TESTING:  Decreased mobility Lt shoulder   PALPATION:  Muscular tightness Lt pecs; biceps; upper trap; leveator; periscapular musculature; deltoid    [facility] Adult PT Treatment:                                                DATE: 03/07/23 Therapeutic Exercise: Pulley flexion 10 sec x 5 Pulley scaption 10 sec x 5  Chin tuck w/ noodle 10 sec x 5 Scap squeeze w/noodle 10 sec x 5  L's w/noodle 5 sec x 5  Manual Therapy:  Neuromuscular re-ed: Postural education and instruction focus on engaging posterior shoulder girdle musculature Modalities: TENS Rt shoulder girdle x 10 min  Moist heat Rt shoulder x 10 min  Self Care: Modifying recliner  to improve posture when sitting  Use of pillows to prop phone, tablet, book when sitting Support for hand when using phone   PATIENT EDUCATION: Education details: POC; HEP Person educated: Patient Education method: Explanation, Demonstration, Tactile cues, Verbal cues, and Handouts Education comprehension: verbalized understanding, returned demonstration, verbal cues required, tactile cues required, and needs further education  HOME EXERCISE PROGRAM: Access Code: [email or web address] URL: [email or web address] Date: 03/07/2023 Prepared by: Gillermo Murdoch  Exercises - Seated Shoulder Flexion AAROM with Pulley Behind  - 2 x daily - 7 x weekly - 1 sets - 10 reps - 10 sec  hold - Seated Shoulder Scaption AAROM with Pulley at Side  - 2 x daily - 7 x weekly - 1 sets - 10 reps - 10sec  hold - Seated Cervical Retraction  - 3 x daily - 7 x weekly - 1 sets - 10 reps - 5-10 hold - Standing Scapular Retraction  - 3 x daily - 7 x weekly - 1 sets - 10 reps - 10 hold - Shoulder External  Rotation and Scapular Retraction  - 3 x daily - 7 x weekly - 1 sets - 10 reps -   hold  Patient Education - TENS Unit - Trigger Point Dry Needling  ASSESSMENT:  CLINICAL IMPRESSION: Patient is a 77 y.o. female who was seen today for physical therapy evaluation and treatment for chronic Lt shoulder pain. She has a history of Lt shoulder pain for the past 10 or more years with increased symptoms in the past year. [name] has poor posture and alignment; limited AROM, decreased strength; decreased functional activity and ADL's using Lt UE; pain; difficulty sleeping due to Lt shoulder pain. Patient will benefit from PT to address problems identified.   OBJECTIVE IMPAIRMENTS: decreased activity tolerance, decreased mobility, decreased ROM, decreased strength, hypomobility, increased fascial restrictions, impaired flexibility, impaired UE functional use, improper body mechanics, postural dysfunction, and pain.   ACTIVITY LIMITATIONS: carrying, lifting, sleeping, dressing, and reach over head  PARTICIPATION LIMITATIONS: meal prep, cleaning, and laundry  PERSONAL FACTORS: Age, Fitness, Past/current experiences, Time since onset of injury/illness/exacerbation, and 1-2 comorbidities: multiple musculoskeletal problems in past are also affecting patient's functional outcome.   REHAB POTENTIAL: Good  CLINICAL DECISION MAKING: Stable/uncomplicated  EVALUATION COMPLEXITY: Low   GOALS: Goals reviewed with patient? Yes  SHORT TERM GOALS: Target date: 04/04/2023   Independent in initial HEP  Baseline: Goal status: INITIAL  2.  Improve posture and alignment with patient to demonstrate improved upright posture with posterior shoulder girdle musculature  Baseline:  Goal status: INITIAL   LONG TERM GOALS: Target date: 05/02/2023   Patient reports decreased pain Lt shoulder by 50% allowing her to sleep for longer periods of time(3-4 hours) without awaking due to pain Baseline:  Goal status:  INITIAL  2.  Increase strength Lt shoulder to 3/5 to 4/5  Baseline:  Goal status: INITIAL  3.  Increase AROM Lt shoulder by 5-10 degrees  Baseline:  Goal status: INITIAL  4.  Patient demonstrates/reports improved functional use of Lt UE for ADL's including light household chores  Baseline:  Goal status: INITIAL  5.  Independent in HEP (including aquatic program as indicated) Baseline:  Goal status: INITIAL  6.  Improve functional limitation score to 60  Baseline:  Goal status: INITIAL  PLAN:  PT FREQUENCY: 2x/week  PT DURATION: 8 weeks  PLANNED INTERVENTIONS: Therapeutic exercises, Therapeutic activity, Neuromuscular re-education, Patient/Family education, Self Care, Joint mobilization, Aquatic Therapy, Dry Needling, Electrical stimulation, Cryotherapy, Moist heat, Taping,  Vasopneumatic device, Ultrasound, Ionotophoresis 4mg /ml Dexamethasone, Manual therapy, and Re-evaluation  PLAN FOR NEXT SESSION: review and progress with exercises and postural correction; manual work, DN, modalities as indicated    [employer], PT 03/07/2023, 12:03 PM

## 2023-03-11 ENCOUNTER — Encounter: Payer: Self-pay | Admitting: Rehabilitative and Restorative Service Providers"

## 2023-03-11 ENCOUNTER — Ambulatory Visit: Payer: PPO | Admitting: Rehabilitative and Restorative Service Providers"

## 2023-03-11 DIAGNOSIS — M25512 Pain in left shoulder: Secondary | ICD-10-CM | POA: Diagnosis not present

## 2023-03-11 DIAGNOSIS — R29898 Other symptoms and signs involving the musculoskeletal system: Secondary | ICD-10-CM

## 2023-03-11 DIAGNOSIS — M6281 Muscle weakness (generalized): Secondary | ICD-10-CM

## 2023-03-11 DIAGNOSIS — R293 Abnormal posture: Secondary | ICD-10-CM

## 2023-03-11 DIAGNOSIS — G8929 Other chronic pain: Secondary | ICD-10-CM

## 2023-03-11 NOTE — Therapy (Signed)
OUTPATIENT PHYSICAL THERAPY SHOULDER TREATMENT   Patient Name: Tara Campbell MRN: [id number] DOB:05/15/1946, 77 y.o., female Today's Date: 03/11/2023  END OF SESSION:  PT End of Session - 03/11/23 1149     Visit Number 2    Number of Visits 16    Date for PT Re-Evaluation 05/02/23    Authorization Type [employer] $10 copay    Progress Note Due on Visit 10    PT Start Time 1148    PT Stop Time 1236    PT Time Calculation (min) 48 min    Activity Tolerance Patient tolerated treatment well             Past Medical History:  Diagnosis Date   Allergy    Arthritis    Asthma    Colon polyps    Coronary artery disease    Diabetes mellitus without complication    Gout    Heart attack 07/30/2016   PCI, 2 stents   Hyperlipidemia    Hypertension    Internal hemorrhoids    Left rotator cuff tear    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   Otomycosis of right ear 09/27/2017   Paroxysmal atrial fibrillation    Thyroid disease    Trochanteric bursitis of right hip 05/29/2017   Past Surgical History:  Procedure Laterality Date   ANGIOPLASTY     CARDIAC CATHETERIZATION     COLONOSCOPY W/ POLYPECTOMY  01/04/2015   CORONARY STENT INTERVENTION N/A 08/31/2022   Procedure: CORONARY STENT INTERVENTION;  Surgeon: Tonny Bollman, MD;  Location: [facility] INVASIVE CV LAB;  Service: Cardiovascular;  Laterality: N/A;   HEMORRHOID BANDING     LEFT HEART CATH AND CORONARY ANGIOGRAPHY N/A 04/26/2021   Procedure: LEFT HEART CATH AND CORONARY ANGIOGRAPHY;  Surgeon: Tonny Bollman, MD;  Location: [facility] INVASIVE CV LAB;  Service: Cardiovascular;  Laterality: N/A;   LEFT HEART CATH AND CORONARY ANGIOGRAPHY N/A 09/14/2022   Procedure: LEFT HEART CATH AND CORONARY ANGIOGRAPHY;  Surgeon: Tonny Bollman, MD;  Location: [facility] INVASIVE CV LAB;  Service: Cardiovascular;  Laterality: N/A;   Patient Active Problem List   Diagnosis Date Noted   Coughing 01/17/2023   NSTEMI (non-ST elevated myocardial  infarction) 09/14/2022   Hypokalemia 09/14/2022   Non-STEMI (non-ST elevated myocardial infarction) 09/14/2022   GAD (generalized anxiety disorder) 09/10/2022   Primary osteoarthritis of right knee 08/16/2022   Hypervitaminosis 01/03/2022   Memory impairment 01/03/2022   TIA (transient ischemic attack) 12/26/2021   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 10/02/2021   Obesity (BMI 30-39.9) 09/06/2021   Angina pectoris 06/23/2021   Hyperlipidemia 04/27/2021   History of non-ST elevation myocardial infarction (NSTEMI) 04/25/2021   Hilar adenopathy 10/13/2018   Mediastinal adenopathy 10/13/2018   Dupuytren's contracture of left hand 08/28/2018   Trigger finger, left index finger 08/28/2018   Non-seasonal allergic rhinitis 08/26/2018   Type 2 diabetes mellitus with diabetic chronic kidney disease 08/06/2018   Controlled diabetes mellitus type 2 with complications 12/27/2017   Class 1 obesity due to excess calories with serious comorbidity in adult 12/27/2017   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   Encounter for long-term (current) use of medications 11/28/2017   Anticoagulant long-term use - on eliquis for PAF 10/21/2017   Sensorineural hearing loss (SNHL) of both ears 09/27/2017   Hypertension goal BP (blood pressure) < 140/90 08/29/2017   CAD (coronary artery disease) 08/29/2017   Gout of ankle 04/08/2017   History of myocardial infarct at age greater than 60 years 01/23/2017  Mixed diabetic hyperlipidemia associated with type 2 diabetes mellitus 01/23/2017   Acquired hypothyroidism 01/23/2017   Severe persistent allergic asthma 01/16/2017   Paroxysmal atrial fibrillation 01/16/2017   Cardiomegaly 01/16/2017    PCP: Dr Nani Gasser  REFERRING PROVIDER: Dr Nani Gasser  REFERRING DIAG: Chronic Lt shoulder pain   THERAPY DIAG:  Chronic left shoulder pain  Other symptoms and signs involving the musculoskeletal system  Muscle weakness  (generalized)  Abnormal posture  Rationale for Evaluation and Treatment: Rehabilitation  ONSET DATE: 01/31/22   SUBJECTIVE:                                                                                                                                                                                      SUBJECTIVE STATEMENT: 03/11/23: Patient reports that she is encouraged that she can make progress with the shoulder pain she has had for many years. Working on exercise at home and has borrowed her sister's TENS unit and that really helps.   Eval:Patient reports that she has had Lt shoulder pain for years. She was diagnosed with degenerative changes in the Lt shoulder and rotator cuff changes ~ 8 years ago. Symptoms have increased in the past year.  Hand dominance: Right  PERTINENT HISTORY: HTN; MI x 2 in the year; Lt shoulder pain; arthritic hip and knee  PAIN:  Are you having pain? Yes: NPRS scale: 0/10 Pain location: Lt shoulder  Pain description: burning  Aggravating factors: worse at night; lifting arm or moving the arm; putting on clothes  Relieving factors: tylenol   PRECAUTIONS: None  WEIGHT BEARING RESTRICTIONS: No  FALLS:  Has patient fallen in last 6 months? No  LIVING ENVIRONMENT: Lives with: lives with their family and lives alone Lives in: House/apartment  OCCUPATION: Retired; sedentary   PATIENT GOALS:decrease Lt shoulder pain and gain strength in Lt arm   NEXT MD VISIT: 06/07/23  OBJECTIVE:   PATIENT SURVEYS:  FOTO 50 goal 60  POSTURE: Patient presents with head forward posture with increased thoracic kyphosis; shoulders rounded and elevated; scapulae abducted and rotated along the thoracic spine; head of the humerus anterior in orientation.   UPPER EXTREMITY ROM:   Active ROM Right eval Left eval  Shoulder flexion 139 100  Shoulder extension 67 37  Shoulder abduction 138 70  Shoulder adduction    Shoulder internal rotation Thumb T9 Hand to Lt  lateral sacrum  Shoulder external rotation 51 8  Elbow flexion    Elbow extension    Wrist flexion    Wrist extension    Wrist ulnar deviation    Wrist radial deviation    Wrist pronation    Wrist  supination    (Blank rows = not tested)  UPPER EXTREMITY MMT: Lt UE strength assessed UE at side; unable to assess in MMT positions   MMT Right eval Left eval  Shoulder flexion 4 3-  Shoulder extension 5 4  Shoulder abduction 4 2+  Shoulder adduction    Shoulder internal rotation 5 2+  Shoulder external rotation 4 2+  Middle trapezius 4 3-  Lower trapezius 4 3-  Elbow flexion    Elbow extension    Wrist flexion    Wrist extension    Wrist ulnar deviation    Wrist radial deviation    Wrist pronation    Wrist supination    Grip strength (lbs)    (Blank rows = not tested)  JOINT MOBILITY TESTING:  Decreased mobility Lt shoulder   PALPATION:  Muscular tightness Lt pecs; biceps; upper trap; leveator; periscapular musculature; deltoid    [facility] Adult PT Treatment:                                                DATE: 03/11/23 Therapeutic Exercise: Pulley flexion 10 sec x 5 Pulley scaption 10 sec x 5  Chin tuck w/ noodle 10 sec x 5 Scap squeeze w/noodle 10 sec x 5  L's w/noodle 3 sec x 10 x 2  W's w/ noodle 3 sec x 10 x 2  Supine scap squeeze 5 sec x 10  Manual Therapy: Soft tissue mobilization Rt anterior shoulder; upper trap Skilled palpation to assess response to DN and manual work  PsychiatristTrigger Point Dry-Needling  Treatment instructions: Expect mild to moderate muscle soreness. S/S of pneumothorax if dry needled over a lung field, and to seek immediate medical attention should they occur. Patient verbalized understanding of these instructions and education. Patient Consent Given: Yes Education handout provided: Previously provided Muscles treated: pecs; biceps tendon; upper trap Lt  Electrical stimulation performed: No Parameters: N/A Treatment response/outcome: decreased  palpable tightness noted following dry needling and manual work   Neuromuscular re-ed: Postural education and instruction focus on engaging posterior shoulder girdle musculature Modalities: TENS Rt shoulder girdle x 10 min  Moist heat Rt shoulder x 10 min  Self Care: Modifying recliner to improve posture when sitting  Use of pillows to prop phone, tablet, book when sitting Support for hand when using phone   [facility] Adult PT Treatment:                                                DATE: 03/07/23 Therapeutic Exercise: Pulley flexion 10 sec x 5 Pulley scaption 10 sec x 5  Chin tuck w/ noodle 10 sec x 5 Scap squeeze w/noodle 10 sec x 5  L's w/noodle 5 sec x 5  Manual Therapy:  Neuromuscular re-ed: Postural education and instruction focus on engaging posterior shoulder girdle musculature Modalities: TENS Rt shoulder girdle x 10 min  Moist heat Rt shoulder x 10 min  Self Care: Modifying recliner to improve posture when sitting  Use of pillows to prop phone, tablet, book when sitting Support for hand when using phone   PATIENT EDUCATION: Education details: POC; HEP Person educated: Patient Education method: Explanation, Demonstration, Tactile cues, Verbal cues, and Handouts Education comprehension: verbalized understanding, returned  demonstration, verbal cues required, tactile cues required, and needs further education  HOME EXERCISE PROGRAM: Access Code: [email or web address] URL: [email or web address] Date: 03/11/2023 Prepared by: Corlis Leak  Exercises - Seated Shoulder Flexion AAROM with Pulley Behind  - 2 x daily - 7 x weekly - 1 sets - 10 reps - 10 sec  hold - Seated Shoulder Scaption AAROM with Pulley at Side  - 2 x daily - 7 x weekly - 1 sets - 10 reps - 10sec  hold - Seated Cervical Retraction  - 3 x daily - 7 x weekly - 1 sets - 10 reps - 5-10 hold - Standing Scapular Retraction  - 3 x daily - 7 x weekly - 1 sets - 10 reps - 10 hold - Shoulder External Rotation and  Scapular Retraction  - 3 x daily - 7 x weekly - 1 sets - 10 reps -   hold - Supine Scapular Retraction  - 2 x daily - 7 x weekly - 1 sets - 10 reps - 5-10 sec  hold  Patient Education - TENS Unit - Trigger Point Dry Needling  ASSESSMENT:  CLINICAL IMPRESSION: Patient reports good response to TENS unit and has borrowed one from her sister. Working on posture and alignment as well as review of exercises. Added trial of DN and manual work Rt shoulder and shoulder girdle which was tolerated well with palpable decrease in tightness. Will progress with posterior shoulder girdle strengthening as tolerated.    OBJECTIVE IMPAIRMENTS: Lt shoulder pain for the past 10 or more years with increased symptoms in the past year. [name] has poor posture and alignment; limited AROM, decreased strength; decreased functional activity and ADL's using Lt UE; pain; difficulty sleeping due to Lt shoulder pain decreased activity tolerance, decreased mobility, decreased ROM, decreased strength, hypomobility, increased fascial restrictions, impaired flexibility, impaired UE functional use, improper body mechanics, postural dysfunction, and pain.    GOALS: Goals reviewed with patient? Yes  SHORT TERM GOALS: Target date: 04/04/2023   Independent in initial HEP  Baseline: Goal status: INITIAL  2.  Improve posture and alignment with patient to demonstrate improved upright posture with posterior shoulder girdle musculature  Baseline:  Goal status: INITIAL   LONG TERM GOALS: Target date: 05/02/2023   Patient reports decreased pain Lt shoulder by 50% allowing her to sleep for longer periods of time(3-4 hours) without awaking due to pain Baseline:  Goal status: INITIAL  2.  Increase strength Lt shoulder to 3/5 to 4/5  Baseline:  Goal status: INITIAL  3.  Increase AROM Lt shoulder by 5-10 degrees  Baseline:  Goal status: INITIAL  4.  Patient demonstrates/reports improved functional use of Lt UE for ADL's  including light household chores  Baseline:  Goal status: INITIAL  5.  Independent in HEP (including aquatic program as indicated) Baseline:  Goal status: INITIAL  6.  Improve functional limitation score to 60  Baseline:  Goal status: INITIAL  PLAN:  PT FREQUENCY: 2x/week  PT DURATION: 8 weeks  PLANNED INTERVENTIONS: Therapeutic exercises, Therapeutic activity, Neuromuscular re-education, Patient/Family education, Self Care, Joint mobilization, Aquatic Therapy, Dry Needling, Electrical stimulation, Cryotherapy, Moist heat, Taping, Vasopneumatic device, Ultrasound, Ionotophoresis /ml Dexamethasone, Manual therapy, and Re-evaluation  PLAN FOR NEXT SESSION: review and progress with exercises and postural correction; manual work, DN, modalities as indicated    [employer], PT 03/11/2023, 11:51 AM

## 2023-03-12 ENCOUNTER — Other Ambulatory Visit: Payer: Self-pay | Admitting: Pharmacist

## 2023-03-12 NOTE — Progress Notes (Signed)
03/12/2023 Name: Tara ArenasSandra B Tennyson MRN: [id number] DOB: 17-Jan-1946   Tara Campbell is a 77 y.o. year old female who presented for a telephone visit.   They were referred to the pharmacist by a quality report for assistance in managing  quality metrics: med adherence cholesterol (MAC), med adherence diabetes (MAD) .   Patient requests: - symbicort BID is not on insurance, pt request alternative   Subjective:  Care Team: Primary Care Provider: Agapito GamesMetheney, Catherine D, MD ; Next Scheduled Visit: 06/04/23  Medication Access/Adherence  Current Pharmacy:  [facility] [place] - [address] [place] [id number] Phone: [phone] Fax: [phone]   Patient reports affordability concerns with their medications: No  Patient reports access/transportation concerns to their pharmacy: No  Patient reports adherence concerns with their medications:  No     Diabetes:  Current medications: metformin 500mg  2 tablets BID, jardiance 10mg  daily Medications tried in the past: ozempic, trulicity  Hypertension:  Current medications: atenolol 100mg  daily, irbesartan & imdur (unsure of doses, patient will check bottles when she is home)    Hyperlipidemia/ASCVD Risk Reduction  Current lipid lowering medications: repatha 420mg  every 28 days, ezetemibe 10mg  daily   Current medication access support: repatha obtained through patient assistance    Objective:  Lab Results  Component Value Date   HGBA1C 6.3 (A) 03/05/2023    Lab Results  Component Value Date   CREATININE 0.96 11/12/2022   BUN 14 11/12/2022   NA 138 11/12/2022   K 4.2 11/12/2022   CL 105 11/12/2022   CO2 25 11/12/2022    Lab Results  Component Value Date   CHOL 96 09/14/2022   HDL 49 09/14/2022   LDLCALC 18 09/14/2022   TRIG 144 09/14/2022   CHOLHDL 2.0 09/14/2022    Medications Reviewed Today     Reviewed by Gabriel CarinaKline, Justino Boze J, RPH (Pharmacist)  on 03/12/23 at 1505  Med List Status: <None>   Medication Order Taking? Sig Documenting Provider Last Dose Status Informant  acetaminophen (TYLENOL) 650 MG CR tablet [id number] Yes Take 1 tablet (650 mg total) by mouth every 8 (eight) hours as needed for pain. Monica Bectonhekkekandam, Thomas J, MD Taking Active Self  albuterol (VENTOLIN HFA) 108 (90 Base) MCG/ACT inhaler [id number] Yes Inhale 2 puffs into the lungs every 6 (six) hours as needed for wheezing. Parrett, Virgel Bouquetammy S, NP Taking Active Self  allopurinol (ZYLOPRIM) 300 MG tablet [id number] Yes Take 1 tablet (300 mg total) by mouth 2 (two) times daily. Agapito GamesMetheney, Catherine D, MD Taking Active   apixaban (ELIQUIS) 5 MG TABS tablet [id number] Yes Take 1 tablet (5 mg total) by mouth 2 (two) times daily. Agapito GamesMetheney, Catherine D, MD Taking Active   atenolol (TENORMIN) 100 MG tablet [id number] Yes Take 1 tablet (100 mg total) by mouth daily. Agapito GamesMetheney, Catherine D, MD Taking Active   atorvastatin (LIPITOR) 80 MG tablet [id number] Yes Take 1 tablet (80 mg total) by mouth at bedtime. Monica Bectonhekkekandam, Thomas J, MD Taking Active Self  budesonide-formoterol [facility](SYMBICORT) 80-4.5 MCG/ACT inhaler [id number] Yes Inhale 2 puffs into the lungs in the morning and at bedtime. Parrett, Virgel Bouquetammy S, NP Taking Active Self  clopidogrel (PLAVIX) 75 MG tablet [id number] Yes Take 1 tablet (75 mg total) by mouth daily. Tonny Bollmanooper, Michael, MD Taking Active Self  cyanocobalamin (VITAMIN B12) 1000 MCG tablet [id number] Yes Take 1,000 mcg by mouth daily. [provider] Taking Active Self  empagliflozin (JARDIANCE) 10 MG TABS tablet [id number]  Yes Take 1 tablet (10 mg total) by mouth daily before breakfast. Laurey Morale, MD Taking Active   EPINEPHrine 0.3 mg/0.3 mL IJ SOAJ injection [id number] Yes Inject 0.3 mg into the muscle as needed for anaphylaxis. Call 9-1-1 after use. Parrett, Virgel Bouquet, NP Taking Active Self  Evolocumab with Infusor (REPATHA PUSHTRONEX SYSTEM) 420 MG/3.5ML SOCT [id number] Yes  Inject 420 mg into the skin every 30 (thirty) days. Monica Becton, MD Taking Active Self  ezetimibe (ZETIA) 10 MG tablet [id number] Yes Take 1 tablet (10 mg total) by mouth daily. Agapito Games, MD Taking Active   fluticasone [facility]) 50 MCG/ACT nasal spray [id number] Yes Place 1 spray into both nostrils daily. Monica Becton, MD Taking Active Self  glucose blood test strip [id number] Yes To be used twice daily for testing blood sugars. E11.65 Agapito Games, MD Taking Active Self  ipratropium (ATROVENT) 0.03 % nasal spray [id number] Yes Place 2 sprays into both nostrils every 12 (twelve) hours. Crain, Whitney L, PA Taking Active   ipratropium-albuterol (DUONEB) 0.5-2.5 (3) MG/3ML SOLN [id number] Yes Take 3 mLs by nebulization every 6 (six) hours as needed. Parrett, Virgel Bouquet, NP Taking Active Self  irbesartan (AVAPRO) 300 MG tablet [id number] No Take 300 mg by mouth daily. [provider] Unknown Active   isosorbide mononitrate (IMDUR) 60 MG 24 hr tablet [id number] No Take 60 mg by mouth 2 (two) times daily. [provider] Unknown Active   levothyroxine (SYNTHROID) 125 MCG tablet [id number] Yes Take 1 tablet (125 mcg total) by mouth daily before breakfast. Agapito Games, MD Taking Active Self  Mepolizumab ([name]) 100 MG/ML [place]ory Broad [id number] Yes Inject 1 mL (100 mg total) into the skin every 28 (twenty-eight) days. Chilton Greathouse, MD Taking Active Self  metFORMIN (GLUCOPHAGE) 500 MG tablet [id number] Yes Take 2 tablets (1,000 mg total) by mouth 2 (two) times daily with a meal. Agapito Games, MD Taking Active   nitroGLYCERIN (NITROSTAT) 0.4 MG SL tablet [id number] Yes Place 1 tablet (0.4 mg total) under the tongue every 5 (five) minutes as needed for chest pain (or tightness). Agapito Games, MD Taking Active   Omega-3 Fatty Acids (FISH OIL) 1000 MG CAPS [id number] Yes Take 1 capsule (1,000 mg total) by mouth daily. Monica Becton, MD  Taking Active Self  ondansetron (ZOFRAN-ODT) 8 MG disintegrating tablet [id number] Yes 8mg  ODT q8 hours prn nausea Agapito Games, MD Taking Active   Polyvinyl Alcohol-Povidone [facility]) [id number] Yes Place 1 drop into both eyes daily as needed (dry eyes). [provider] Taking Active Self  ranolazine (RANEXA) 500 MG 12 hr tablet [id number] Yes Take 1 tablet (500 mg total) by mouth 2 (two) times daily. Alen Bleacher, NP Taking Active   Vitamin D, Cholecalciferol, 25 MCG (1000 UT) TABS [id number] Yes Take 25 mcg by mouth daily in the afternoon. [place], Eric J, DO Taking Active Self  zinc gluconate 50 MG tablet [id number] Yes Take 50 mg by mouth at bedtime. [provider] Taking Active Self              Assessment/Plan:   Diabetes: - Currently controlled. Reviewed fill history and confirmed usage with patient, no concerns identified - Recommend to continue current regimen   Hypertension: - Currently controlled. Reviewed fill history and confirmed usage with patient, no concerns identified, though patient may be taking half tablets of irbesartan, she will review once home and confirm via mychart message - Recommend to continue current regimen  Hyperlipidemia/ASCVD Risk Reduction: - Currently controlled. Reviewed fill history and confirmed usage with patient, no concerns identified - Recommend to continue current regimen    Follow Up Plan: will await mychart message to confirm doses of blood pressure medications  Lynnda Shields, PharmD, BCPS Clinical Pharmacist [facility]

## 2023-03-13 ENCOUNTER — Ambulatory Visit: Payer: PPO | Admitting: Rehabilitative and Restorative Service Providers"

## 2023-03-13 ENCOUNTER — Encounter: Payer: Self-pay | Admitting: Rehabilitative and Restorative Service Providers"

## 2023-03-13 DIAGNOSIS — G8929 Other chronic pain: Secondary | ICD-10-CM

## 2023-03-13 DIAGNOSIS — E039 Hypothyroidism, unspecified: Secondary | ICD-10-CM | POA: Diagnosis not present

## 2023-03-13 DIAGNOSIS — M25512 Pain in left shoulder: Secondary | ICD-10-CM | POA: Diagnosis not present

## 2023-03-13 DIAGNOSIS — R29898 Other symptoms and signs involving the musculoskeletal system: Secondary | ICD-10-CM

## 2023-03-13 DIAGNOSIS — M6281 Muscle weakness (generalized): Secondary | ICD-10-CM

## 2023-03-13 DIAGNOSIS — N1831 Chronic kidney disease, stage 3a: Secondary | ICD-10-CM | POA: Diagnosis not present

## 2023-03-13 DIAGNOSIS — E1122 Type 2 diabetes mellitus with diabetic chronic kidney disease: Secondary | ICD-10-CM | POA: Diagnosis not present

## 2023-03-13 DIAGNOSIS — I10 Essential (primary) hypertension: Secondary | ICD-10-CM | POA: Diagnosis not present

## 2023-03-13 DIAGNOSIS — R293 Abnormal posture: Secondary | ICD-10-CM

## 2023-03-13 NOTE — Therapy (Signed)
OUTPATIENT PHYSICAL THERAPY SHOULDER TREATMENT   Patient Name: Tara Campbell MRN: [id number] DOB:Apr 25, 1946, 77 y.o., female Today's Date: 03/13/2023  END OF SESSION:  PT End of Session - 03/13/23 0932     Visit Number 3    Number of Visits 16    Date for PT Re-Evaluation 05/02/23    Authorization Type [employer] $10 copay    Progress Note Due on Visit 10    PT Start Time 0930    PT Stop Time 1019    PT Time Calculation (min) 49 min    Activity Tolerance Patient tolerated treatment well             Past Medical History:  Diagnosis Date   Allergy    Arthritis    Asthma    Colon polyps    Coronary artery disease    Diabetes mellitus without complication    Gout    Heart attack 07/30/2016   PCI, 2 stents   Hyperlipidemia    Hypertension    Internal hemorrhoids    Left rotator cuff tear    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   Otomycosis of right ear 09/27/2017   Paroxysmal atrial fibrillation    Thyroid disease    Trochanteric bursitis of right hip 05/29/2017   Past Surgical History:  Procedure Laterality Date   ANGIOPLASTY     CARDIAC CATHETERIZATION     COLONOSCOPY W/ POLYPECTOMY  01/04/2015   CORONARY STENT INTERVENTION N/A 08/31/2022   Procedure: CORONARY STENT INTERVENTION;  Surgeon: Tonny Bollman, MD;  Location: [facility] INVASIVE CV LAB;  Service: Cardiovascular;  Laterality: N/A;   HEMORRHOID BANDING     LEFT HEART CATH AND CORONARY ANGIOGRAPHY N/A 04/26/2021   Procedure: LEFT HEART CATH AND CORONARY ANGIOGRAPHY;  Surgeon: Tonny Bollman, MD;  Location: [facility] INVASIVE CV LAB;  Service: Cardiovascular;  Laterality: N/A;   LEFT HEART CATH AND CORONARY ANGIOGRAPHY N/A 09/14/2022   Procedure: LEFT HEART CATH AND CORONARY ANGIOGRAPHY;  Surgeon: Tonny Bollman, MD;  Location: [facility] INVASIVE CV LAB;  Service: Cardiovascular;  Laterality: N/A;   Patient Active Problem List   Diagnosis Date Noted   Coughing 01/17/2023   NSTEMI (non-ST elevated myocardial  infarction) 09/14/2022   Hypokalemia 09/14/2022   Non-STEMI (non-ST elevated myocardial infarction) 09/14/2022   GAD (generalized anxiety disorder) 09/10/2022   Primary osteoarthritis of right knee 08/16/2022   Hypervitaminosis 01/03/2022   Memory impairment 01/03/2022   TIA (transient ischemic attack) 12/26/2021   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 10/02/2021   Obesity (BMI 30-39.9) 09/06/2021   Angina pectoris 06/23/2021   Hyperlipidemia 04/27/2021   History of non-ST elevation myocardial infarction (NSTEMI) 04/25/2021   Hilar adenopathy 10/13/2018   Mediastinal adenopathy 10/13/2018   Dupuytren's contracture of left hand 08/28/2018   Trigger finger, left index finger 08/28/2018   Non-seasonal allergic rhinitis 08/26/2018   Type 2 diabetes mellitus with diabetic chronic kidney disease 08/06/2018   Controlled diabetes mellitus type 2 with complications 12/27/2017   Class 1 obesity due to excess calories with serious comorbidity in adult 12/27/2017   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   Encounter for long-term (current) use of medications 11/28/2017   Anticoagulant long-term use - on eliquis for PAF 10/21/2017   Sensorineural hearing loss (SNHL) of both ears 09/27/2017   Hypertension goal BP (blood pressure) < 140/90 08/29/2017   CAD (coronary artery disease) 08/29/2017   Gout of ankle 04/08/2017   History of myocardial infarct at age greater than 60 years 01/23/2017  Mixed diabetic hyperlipidemia associated with type 2 diabetes mellitus 01/23/2017   Acquired hypothyroidism 01/23/2017   Severe persistent allergic asthma 01/16/2017   Paroxysmal atrial fibrillation 01/16/2017   Cardiomegaly 01/16/2017    PCP: Dr Nani Gasser  REFERRING PROVIDER: Dr Nani Gasser  REFERRING DIAG: Chronic Lt shoulder pain   THERAPY DIAG:  Chronic left shoulder pain  Other symptoms and signs involving the musculoskeletal system  Muscle weakness  (generalized)  Abnormal posture  Rationale for Evaluation and Treatment: Rehabilitation  ONSET DATE: 01/31/22   SUBJECTIVE:                                                                                                                                                                                      SUBJECTIVE STATEMENT: Patient reports that her shoulder is feeling much better. She has minimal soreness following the dry needling and manual work. Working on exercise at home and has borrowed her sister's TENS unit and that really helps.   Eval:Patient reports that she has had Lt shoulder pain for years. She was diagnosed with degenerative changes in the Lt shoulder and rotator cuff changes ~ 8 years ago. Symptoms have increased in the past year.  Hand dominance: Right  PERTINENT HISTORY: HTN; MI x 2 in the year; Lt shoulder pain; arthritic hip and knee  PAIN:  Are you having pain? Yes: NPRS scale: 3-4/10 Pain location: Lt shoulder  Pain description: tender  Aggravating factors: worse at night; lifting arm or moving the arm; putting on clothes  Relieving factors: tylenol   PRECAUTIONS: None  WEIGHT BEARING RESTRICTIONS: No  FALLS:  Has patient fallen in last 6 months? No  LIVING ENVIRONMENT: Lives with: lives with their family and lives alone Lives in: House/apartment  OCCUPATION: Retired; sedentary   PATIENT GOALS:decrease Lt shoulder pain and gain strength in Lt arm   NEXT MD VISIT: 06/07/23  OBJECTIVE:   PATIENT SURVEYS:  FOTO 50 goal 60  POSTURE: Patient presents with head forward posture with increased thoracic kyphosis; shoulders rounded and elevated; scapulae abducted and rotated along the thoracic spine; head of the humerus anterior in orientation.   UPPER EXTREMITY ROM:   Active ROM Right eval Left eval  Shoulder flexion 139 100  Shoulder extension 67 37  Shoulder abduction 138 70  Shoulder adduction    Shoulder internal rotation Thumb T9 Hand to Lt  lateral sacrum  Shoulder external rotation 51 8  Elbow flexion    Elbow extension    Wrist flexion    Wrist extension    Wrist ulnar deviation    Wrist radial deviation    Wrist pronation    Wrist supination    (  Blank rows = not tested)  UPPER EXTREMITY MMT: Lt UE strength assessed UE at side; unable to assess in MMT positions   MMT Right eval Left eval  Shoulder flexion 4 3-  Shoulder extension 5 4  Shoulder abduction 4 2+  Shoulder adduction    Shoulder internal rotation 5 2+  Shoulder external rotation 4 2+  Middle trapezius 4 3-  Lower trapezius 4 3-  Elbow flexion    Elbow extension    Wrist flexion    Wrist extension    Wrist ulnar deviation    Wrist radial deviation    Wrist pronation    Wrist supination    Grip strength (lbs)    (Blank rows = not tested)  JOINT MOBILITY TESTING:  Decreased mobility Lt shoulder   PALPATION:  Muscular tightness Lt pecs; biceps; upper trap; leveator; periscapular musculature; deltoid    [facility] Adult PT Treatment:                                                DATE: 03/13/23 Therapeutic Exercise: Pulley flexion 10 sec x 5 Pulley scaption 10 sec x 5  Pulley shoulder horizontal ab/adduction x 10 Chin tuck w/ noodle 10 sec x 5 Scap squeeze w/noodle 10 sec x 5  L's w/noodle 3 sec x 10 x 2  L's w/noodle yellow TB 2-3 sec 10 x 2 W's w/ noodle 3 sec x 10 x 2  Isometric shoulder extension Lt UE on noodle 3 sec x 10  Isometric shoulder ER 3 sec x 10  Shoulder rolls backward  Supine scap squeeze 5 sec x 10  Manual Therapy: Soft tissue mobilization Rt anterior shoulder; upper trap Skilled palpation to assess response to DN and manual work  PsychiatristTrigger Point Dry-Needling  Treatment instructions: Expect mild to moderate muscle soreness. S/S of pneumothorax if dry needled over a lung field, and to seek immediate medical attention should they occur. Patient verbalized understanding of these instructions and education. Patient Consent Given:  Yes Education handout provided: Previously provided Muscles treated: pecs; biceps tendon; upper trap Lt  Electrical stimulation performed: No Parameters: N/A Treatment response/outcome: decreased palpable tightness noted following dry needling and manual work   Neuromuscular re-ed: Postural education and instruction focus on engaging posterior shoulder girdle musculature Modalities: TENS(home)  Moist heat Rt shoulder x 10 min  Self Care: Modifying recliner to improve posture when sitting  Use of pillows to prop phone, tablet, book when sitting Support for hand when using phone   [facility]:                                                DATE: 03/11/23 Therapeutic Exercise: Pulley flexion 10 sec x 5 Pulley scaption 10 sec x 5  Chin tuck w/ noodle 10 sec x 5 Scap squeeze w/noodle 10 sec x 5  L's w/noodle 3 sec x 10 x 2  W's w/ noodle 3 sec x 10 x 2  Supine scap squeeze 5 sec x 10  Manual Therapy: Soft tissue mobilization Rt anterior shoulder; upper trap Skilled palpation to assess response to DN and manual work  PsychiatristTrigger Point Dry-Needling  Treatment instructions: Expect mild to moderate muscle soreness. S/S of pneumothorax  if dry needled over a lung field, and to seek immediate medical attention should they occur. Patient verbalized understanding of these instructions and education. Patient Consent Given: Yes Education handout provided: Previously provided Muscles treated: pecs; biceps tendon; upper trap Lt  Electrical stimulation performed: No Parameters: N/A Treatment response/outcome: decreased palpable tightness noted following dry needling and manual work   Neuromuscular re-ed: Postural education and instruction focus on engaging posterior shoulder girdle musculature Modalities: TENS Rt shoulder girdle x 10 min  Moist heat Rt shoulder x 10 min  Self Care: Modifying recliner to improve posture when sitting  Use of pillows to prop phone, tablet, book when  sitting Support for hand when using phone    PATIENT EDUCATION: Education details: POC; HEP Person educated: Patient Education method: Explanation, Demonstration, Tactile cues, Verbal cues, and Handouts Education comprehension: verbalized understanding, returned demonstration, verbal cues required, tactile cues required, and needs further education  HOME EXERCISE PROGRAM: Access Code: [email or web address] URL: [email or web address] Date: 03/13/2023 Prepared by: Corlis Leak  Exercises - Seated Shoulder Flexion AAROM with Pulley Behind  - 2 x daily - 7 x weekly - 1 sets - 10 reps - 10 sec  hold - Seated Shoulder Scaption AAROM with Pulley at Side  - 2 x daily - 7 x weekly - 1 sets - 10 reps - 10sec  hold - Seated Cervical Retraction  - 3 x daily - 7 x weekly - 1 sets - 10 reps - 5-10 hold - Standing Scapular Retraction  - 3 x daily - 7 x weekly - 1 sets - 10 reps - 10 hold - Shoulder External Rotation and Scapular Retraction  - 3 x daily - 7 x weekly - 1 sets - 10 reps -   hold - Supine Scapular Retraction  - 2 x daily - 7 x weekly - 1 sets - 10 reps - 5-10 sec  hold - Shoulder External Rotation and Scapular Retraction with Resistance  - 2 x daily - 7 x weekly - 1 sets - 10 reps - 3-5 sec  hold - Isometric Shoulder Extension at Wall  - 2 x daily - 7 x weekly - 1 sets - 5-10 reps - 5 sec  hold - Isometric Shoulder External Rotation at Wall  - 2 x daily - 7 x weekly - 1 sets - 5 reps - 5 sec  hold - Standing Backward Shoulder Rolls  - 2 x daily - 7 x weekly - 1 sets - 10 reps - 1-2 sec  hold  Patient Education - TENS Unit - Trigger Point Dry Needling  ASSESSMENT:  CLINICAL IMPRESSION: Patient reports good response to exercises and DN/manual work. She is working to improve posture and alignment at home. Working on posture and alignment as well as review of exercises. Will progress with posterior shoulder girdle strengthening as tolerated.    OBJECTIVE IMPAIRMENTS: Lt shoulder pain  for the past 10 or more years with increased symptoms in the past year. [name] has poor posture and alignment; limited AROM, decreased strength; decreased functional activity and ADL's using Lt UE; pain; difficulty sleeping due to Lt shoulder pain decreased activity tolerance, decreased mobility, decreased ROM, decreased strength, hypomobility, increased fascial restrictions, impaired flexibility, impaired UE functional use, improper body mechanics, postural dysfunction, and pain.    GOALS: Goals reviewed with patient? Yes  SHORT TERM GOALS: Target date: 04/04/2023   Independent in initial HEP  Baseline: Goal status: INITIAL  2.  Improve posture and alignment with patient to demonstrate improved  upright posture with posterior shoulder girdle musculature  Baseline:  Goal status: INITIAL   LONG TERM GOALS: Target date: 05/02/2023   Patient reports decreased pain Lt shoulder by 50% allowing her to sleep for longer periods of time(3-4 hours) without awaking due to pain Baseline:  Goal status: INITIAL  2.  Increase strength Lt shoulder to 3/5 to 4/5  Baseline:  Goal status: INITIAL  3.  Increase AROM Lt shoulder by 5-10 degrees  Baseline:  Goal status: INITIAL  4.  Patient demonstrates/reports improved functional use of Lt UE for ADL's including light household chores  Baseline:  Goal status: INITIAL  5.  Independent in HEP (including aquatic program as indicated) Baseline:  Goal status: INITIAL  6.  Improve functional limitation score to 60  Baseline:  Goal status: INITIAL  PLAN:  PT FREQUENCY: 2x/week  PT DURATION: 8 weeks  PLANNED INTERVENTIONS: Therapeutic exercises, Therapeutic activity, Neuromuscular re-education, Patient/Family education, Self Care, Joint mobilization, Aquatic Therapy, Dry Needling, Electrical stimulation, Cryotherapy, Moist heat, Taping, Vasopneumatic device, Ultrasound, Ionotophoresis 4mg /ml Dexamethasone, Manual therapy, and Re-evaluation  PLAN  FOR NEXT SESSION: review and progress with exercises and postural correction; manual work, DN, modalities as indicated    [employer], PT 03/13/2023, 9:32 AM

## 2023-03-14 LAB — COMPLETE METABOLIC PANEL WITH GFR
AG Ratio: 1.7 (calc) (ref 1.0–2.5)
ALT: 12 U/L (ref 6–29)
AST: 14 U/L (ref 10–35)
Albumin: 4.5 g/dL (ref 3.6–5.1)
Alkaline phosphatase (APISO): 96 U/L (ref 37–153)
BUN: 19 mg/dL (ref 7–25)
CO2: 26 mmol/L (ref 20–32)
Calcium: 10.1 mg/dL (ref 8.6–10.4)
Chloride: 107 mmol/L (ref 98–110)
Creat: 0.99 mg/dL (ref 0.60–1.00)
Globulin: 2.6 g/dL (calc) (ref 1.9–3.7)
Glucose, Bld: 104 mg/dL — ABNORMAL HIGH (ref 65–99)
Potassium: 4.3 mmol/L (ref 3.5–5.3)
Sodium: 142 mmol/L (ref 135–146)
Total Bilirubin: 0.7 mg/dL (ref 0.2–1.2)
Total Protein: 7.1 g/dL (ref 6.1–8.1)
eGFR: 58 mL/min/{1.73_m2} — ABNORMAL LOW (ref >=60)

## 2023-03-14 LAB — LIPID PANEL W/REFLEX DIRECT LDL
Cholesterol: 204 mg/dL — ABNORMAL HIGH (ref <200)
HDL: 66 mg/dL (ref >=50)
LDL Cholesterol (Calc): 108 mg/dL (calc) — ABNORMAL HIGH
Non-HDL Cholesterol (Calc): 138 mg/dL (calc) — ABNORMAL HIGH (ref <130)
Total CHOL/HDL Ratio: 3.1 (calc) (ref <5.0)
Triglycerides: 180 mg/dL — ABNORMAL HIGH (ref <150)

## 2023-03-14 LAB — TSH: TSH: 2.71 mIU/L (ref 0.40–4.50)

## 2023-03-14 NOTE — Progress Notes (Signed)
Hi Tara Campbell, your cholesterol went way up compared to 6 months ago.  It something happened with your Lipitor?  Otherwise, your thyroid and metabolic panel look good.  Would also like to get you scheduled for Medicare wellness exam with our Medicare wellness nurse,B[place].

## 2023-03-18 ENCOUNTER — Encounter: Payer: Self-pay | Admitting: Rehabilitative and Restorative Service Providers"

## 2023-03-18 ENCOUNTER — Ambulatory Visit: Payer: PPO | Admitting: Rehabilitative and Restorative Service Providers"

## 2023-03-18 DIAGNOSIS — M6281 Muscle weakness (generalized): Secondary | ICD-10-CM

## 2023-03-18 DIAGNOSIS — R29898 Other symptoms and signs involving the musculoskeletal system: Secondary | ICD-10-CM

## 2023-03-18 DIAGNOSIS — R293 Abnormal posture: Secondary | ICD-10-CM

## 2023-03-18 DIAGNOSIS — M25512 Pain in left shoulder: Secondary | ICD-10-CM | POA: Diagnosis not present

## 2023-03-18 DIAGNOSIS — G8929 Other chronic pain: Secondary | ICD-10-CM

## 2023-03-18 NOTE — Therapy (Signed)
OUTPATIENT PHYSICAL THERAPY SHOULDER TREATMENT   Patient Name: Tara Campbell MRN: [id number] DOB:[birth year], 77 y.o., female Today's Date: 03/18/2023  END OF SESSION:  PT End of Session - 03/18/23 1152     Visit Number 4    Number of Visits 16    Date for PT Re-Evaluation 05/02/23    Authorization Type [employer] $10 copay    Progress Note Due on Visit 10    PT Start Time 1147    PT Stop Time 1232    PT Time Calculation (min) 45 min    Activity Tolerance Patient tolerated treatment well             Past Medical History:  Diagnosis Date   Allergy    Arthritis    Asthma    Colon polyps    Coronary artery disease    Diabetes mellitus without complication    Gout    Heart attack 07/30/2016   PCI, 2 stents   Hyperlipidemia    Hypertension    Internal hemorrhoids    Left rotator cuff tear    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   Otomycosis of right ear 09/27/2017   Paroxysmal atrial fibrillation    Thyroid disease    Trochanteric bursitis of right hip 05/29/2017   Past Surgical History:  Procedure Laterality Date   ANGIOPLASTY     CARDIAC CATHETERIZATION     COLONOSCOPY W/ POLYPECTOMY  01/04/2015   CORONARY STENT INTERVENTION N/A 08/31/2022   Procedure: CORONARY STENT INTERVENTION;  Surgeon: Tonny Bollman, MD;  Location: [facility] INVASIVE CV LAB;  Service: Cardiovascular;  Laterality: N/A;   HEMORRHOID BANDING     LEFT HEART CATH AND CORONARY ANGIOGRAPHY N/A 04/26/2021   Procedure: LEFT HEART CATH AND CORONARY ANGIOGRAPHY;  Surgeon: Tonny Bollman, MD;  Location: [facility] INVASIVE CV LAB;  Service: Cardiovascular;  Laterality: N/A;   LEFT HEART CATH AND CORONARY ANGIOGRAPHY N/A 09/14/2022   Procedure: LEFT HEART CATH AND CORONARY ANGIOGRAPHY;  Surgeon: Tonny Bollman, MD;  Location: [facility] INVASIVE CV LAB;  Service: Cardiovascular;  Laterality: N/A;   Patient Active Problem List   Diagnosis Date Noted   Coughing 01/17/2023   NSTEMI (non-ST elevated myocardial  infarction) 09/14/2022   Hypokalemia 09/14/2022   Non-STEMI (non-ST elevated myocardial infarction) 09/14/2022   GAD (generalized anxiety disorder) 09/10/2022   Primary osteoarthritis of right knee 08/16/2022   Hypervitaminosis 01/03/2022   Memory impairment 01/03/2022   TIA (transient ischemic attack) 12/26/2021   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 10/02/2021   Obesity (BMI 30-39.9) 09/06/2021   Angina pectoris 06/23/2021   Hyperlipidemia 04/27/2021   History of non-ST elevation myocardial infarction (NSTEMI) 04/25/2021   Hilar adenopathy 10/13/2018   Mediastinal adenopathy 10/13/2018   Dupuytren's contracture of left hand 08/28/2018   Trigger finger, left index finger 08/28/2018   Non-seasonal allergic rhinitis 08/26/2018   Type 2 diabetes mellitus with diabetic chronic kidney disease 08/06/2018   Controlled diabetes mellitus type 2 with complications 12/27/2017   Class 1 obesity due to excess calories with serious comorbidity in adult 12/27/2017   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   Encounter for long-term (current) use of medications 11/28/2017   Anticoagulant long-term use - on eliquis for PAF 10/21/2017   Sensorineural hearing loss (SNHL) of both ears 09/27/2017   Hypertension goal BP (blood pressure) < 140/90 08/29/2017   CAD (coronary artery disease) 08/29/2017   Gout of ankle 04/08/2017   History of myocardial infarct at age greater than 60 years 01/23/2017  Mixed diabetic hyperlipidemia associated with type 2 diabetes mellitus 01/23/2017   Acquired hypothyroidism 01/23/2017   Severe persistent allergic asthma 01/16/2017   Paroxysmal atrial fibrillation 01/16/2017   Cardiomegaly 01/16/2017    PCP: Dr Nani Gasser  REFERRING PROVIDER: Dr Nani Gasser  REFERRING DIAG: Chronic Lt shoulder pain   THERAPY DIAG:  Chronic left shoulder pain  Other symptoms and signs involving the musculoskeletal system  Muscle weakness  (generalized)  Abnormal posture  Rationale for Evaluation and Treatment: Rehabilitation  ONSET DATE: 01/31/22   SUBJECTIVE:                                                                                                                                                                                      SUBJECTIVE STATEMENT: Patient reports that her shoulder was feeling much better but the dog pulled on her arm while she was walking her Saturday. Treated with the TENS and heat but it is still hurting. She has minimal soreness following the dry needling and manual work. Working on exercise at home and has borrowed her sister's TENS unit and that really helps.   Eval:Patient reports that she has had Lt shoulder pain for years. She was diagnosed with degenerative changes in the Lt shoulder and rotator cuff changes ~ 8 years ago. Symptoms have increased in the past year.   Hand dominance: Right  PERTINENT HISTORY: HTN; MI x 2 in 2017; Lt shoulder pain; arthritic hip and knee  PAIN:  Are you having pain? Yes: NPRS scale: 6/10 Pain location: Lt shoulder  Pain description: tender  Aggravating factors: worse at night; lifting arm or moving the arm; putting on clothes  Relieving factors: tylenol   PRECAUTIONS: None  WEIGHT BEARING RESTRICTIONS: No  FALLS:  Has patient fallen in last 6 months? No  LIVING ENVIRONMENT: Lives with: lives with their family and lives alone Lives in: House/apartment  OCCUPATION: Retired; sedentary   PATIENT GOALS:decrease Lt shoulder pain and gain strength in Lt arm   NEXT MD VISIT: 06/07/23  OBJECTIVE:   PATIENT SURVEYS:  FOTO 50 goal 60  POSTURE: Patient presents with head forward posture with increased thoracic kyphosis; shoulders rounded and elevated; scapulae abducted and rotated along the thoracic spine; head of the humerus anterior in orientation.   UPPER EXTREMITY ROM:   Active ROM Right eval Left eval  Shoulder flexion 139 100   Shoulder extension 67 37  Shoulder abduction 138 70  Shoulder adduction    Shoulder internal rotation Thumb T9 Hand to Lt lateral sacrum  Shoulder external rotation 51 8  Elbow flexion    Elbow extension    Wrist flexion    Wrist  extension    Wrist ulnar deviation    Wrist radial deviation    Wrist pronation    Wrist supination    (Blank rows = not tested)  UPPER EXTREMITY MMT: Lt UE strength assessed UE at side; unable to assess in MMT positions   MMT Right eval Left eval  Shoulder flexion 4 3-  Shoulder extension 5 4  Shoulder abduction 4 2+  Shoulder adduction    Shoulder internal rotation 5 2+  Shoulder external rotation 4 2+  Middle trapezius 4 3-  Lower trapezius 4 3-  Elbow flexion    Elbow extension    Wrist flexion    Wrist extension    Wrist ulnar deviation    Wrist radial deviation    Wrist pronation    Wrist supination    Grip strength (lbs)    (Blank rows = not tested)  JOINT MOBILITY TESTING:  Decreased mobility Lt shoulder   PALPATION:  Muscular tightness Lt pecs; biceps; upper trap; leveator; periscapular musculature; deltoid    [facility] Adult PT Treatment:                                                DATE: 03/18/23 Therapeutic Exercise: Pulley flexion 10 sec x 5 Pulley scaption 10 sec x 5  Pulley shoulder horizontal ab/adduction x 10 Chin tuck w/ noodle 10 sec x 5 Scap squeeze w/noodle 10 sec x 5  L's w/noodle 3 sec x 10 x 2  L's w/noodle yellow TB 2-3 sec 10 x 2 W's w/ noodle 3 sec x 10 x 2  Isometric shoulder extension Lt UE on noodle 3 sec x 10  Isometric shoulder ER 3 sec x 10  Shoulder rolls backward  Supine scap squeeze 5 sec x 10  Manual Therapy: Soft tissue mobilization Rt anterior shoulder; upper trap Skilled palpation to assess response to DN and manual work  Psychiatrist  Treatment instructions: Expect mild to moderate muscle soreness. S/S of pneumothorax if dry needled over a lung field, and to seek immediate  medical attention should they occur. Patient verbalized understanding of these instructions and education. Patient Consent Given: Yes Education handout provided: Previously provided Muscles treated: pecs; biceps tendon; upper trap Lt  Electrical stimulation performed: No Parameters: N/A Treatment response/outcome: decreased palpable tightness noted following dry needling and manual work   Neuromuscular re-ed: Postural education and instruction focus on engaging posterior shoulder girdle musculature Modalities: TENS(home)  Moist heat Rt shoulder x 10 min  Self Care: Modifying recliner to improve posture when sitting  Use of pillows to prop phone, tablet, book when sitting Support for hand when using phone   [facility] Adult PT Treatment:                                                DATE: 03/13/23 Therapeutic Exercise: Pulley flexion 10 sec x 5 Pulley scaption 10 sec x 5  Pulley shoulder horizontal ab/adduction x 10 Chin tuck w/ noodle 10 sec x 5 Scap squeeze w/noodle 10 sec x 5  L's w/noodle 3 sec x 10 x 2  L's w/noodle yellow TB 2-3 sec 10 x 2 W's w/ noodle 3 sec x 10 x 2  Isometric shoulder extension  Lt UE on noodle 3 sec x 10  Isometric shoulder ER 3 sec x 10  Shoulder rolls backward  Supine scap squeeze 5 sec x 10  Manual Therapy: Soft tissue mobilization Rt anterior shoulder; upper trap Skilled palpation to assess response to DN and manual work  Psychiatrist  Treatment instructions: Expect mild to moderate muscle soreness. S/S of pneumothorax if dry needled over a lung field, and to seek immediate medical attention should they occur. Patient verbalized understanding of these instructions and education. Patient Consent Given: Yes Education handout provided: Previously provided Muscles treated: pecs; biceps tendon; upper trap Lt  Electrical stimulation performed: No Parameters: N/A Treatment response/outcome: decreased palpable tightness noted following dry  needling and manual work   Neuromuscular re-ed: Postural education and instruction focus on engaging posterior shoulder girdle musculature Modalities: TENS(home)  Moist heat Rt shoulder x 10 min  Self Care: Modifying recliner to improve posture when sitting  Use of pillows to prop phone, tablet, book when sitting Support for hand when using phone    PATIENT EDUCATION: Education details: POC; HEP Person educated: Patient Education method: Explanation, Demonstration, Tactile cues, Verbal cues, and Handouts Education comprehension: verbalized understanding, returned demonstration, verbal cues required, tactile cues required, and needs further education  HOME EXERCISE PROGRAM: Access Code: [email or web address] URL: [email or web address] Date: 03/13/2023 Prepared by: Corlis Leak  Exercises - Seated Shoulder Flexion AAROM with Pulley Behind  - 2 x daily - 7 x weekly - 1 sets - 10 reps - 10 sec  hold - Seated Shoulder Scaption AAROM with Pulley at Side  - 2 x daily - 7 x weekly - 1 sets - 10 reps - 10sec  hold - Seated Cervical Retraction  - 3 x daily - 7 x weekly - 1 sets - 10 reps - 5-10 hold - Standing Scapular Retraction  - 3 x daily - 7 x weekly - 1 sets - 10 reps - 10 hold - Shoulder External Rotation and Scapular Retraction  - 3 x daily - 7 x weekly - 1 sets - 10 reps -   hold - Supine Scapular Retraction  - 2 x daily - 7 x weekly - 1 sets - 10 reps - 5-10 sec  hold - Shoulder External Rotation and Scapular Retraction with Resistance  - 2 x daily - 7 x weekly - 1 sets - 10 reps - 3-5 sec  hold - Isometric Shoulder Extension at Wall  - 2 x daily - 7 x weekly - 1 sets - 5-10 reps - 5 sec  hold - Isometric Shoulder External Rotation at Wall  - 2 x daily - 7 x weekly - 1 sets - 5 reps - 5 sec  hold - Standing Backward Shoulder Rolls  - 2 x daily - 7 x weekly - 1 sets - 10 reps - 1-2 sec  hold  Patient Education - TENS Unit - Trigger Point Dry Needling  ASSESSMENT:  CLINICAL  IMPRESSION: Flare up of shoulder pain today from being pulled by dog while walking her on Saturday. Note significant increase in palpable tightness in the Lt anterior shoulder and upper trap. Decreased tightness noted following treatment. Patient responds well to exercises and DN/manual work. Working on posture and alignment as well as review of exercises. Will progress with posterior shoulder girdle strengthening as tolerated.    OBJECTIVE IMPAIRMENTS: Lt shoulder pain for the past 10 or more years with increased symptoms in the past year. [name] has poor posture and alignment; limited AROM,  decreased strength; decreased functional activity and ADL's using Lt UE; pain; difficulty sleeping due to Lt shoulder pain decreased activity tolerance, decreased mobility, decreased ROM, decreased strength, hypomobility, increased fascial restrictions, impaired flexibility, impaired UE functional use, improper body mechanics, postural dysfunction, and pain.    GOALS: Goals reviewed with patient? Yes  SHORT TERM GOALS: Target date: 04/04/2023   Independent in initial HEP  Baseline: Goal status: INITIAL  2.  Improve posture and alignment with patient to demonstrate improved upright posture with posterior shoulder girdle musculature  Baseline:  Goal status: INITIAL   LONG TERM GOALS: Target date: 05/02/2023   Patient reports decreased pain Lt shoulder by 50% allowing her to sleep for longer periods of time(3-4 hours) without awaking due to pain Baseline:  Goal status: INITIAL  2.  Increase strength Lt shoulder to 3/5 to 4/5  Baseline:  Goal status: INITIAL  3.  Increase AROM Lt shoulder by 5-10 degrees  Baseline:  Goal status: INITIAL  4.  Patient demonstrates/reports improved functional use of Lt UE for ADL's including light household chores  Baseline:  Goal status: INITIAL  5.  Independent in HEP (including aquatic program as indicated) Baseline:  Goal status: INITIAL  6.  Improve  functional limitation score to 60  Baseline:  Goal status: INITIAL  PLAN:  PT FREQUENCY: 2x/week  PT DURATION: 8 weeks  PLANNED INTERVENTIONS: Therapeutic exercises, Therapeutic activity, Neuromuscular re-education, Patient/Family education, Self Care, Joint mobilization, Aquatic Therapy, Dry Needling, Electrical stimulation, Cryotherapy, Moist heat, Taping, Vasopneumatic device, Ultrasound, Ionotophoresis /ml Dexamethasone, Manual therapy, and Re-evaluation  PLAN FOR NEXT SESSION: review and progress with exercises and postural correction; manual work, DN, modalities as indicated    [employer], PT 03/18/2023, 11:53 AM

## 2023-03-21 ENCOUNTER — Encounter: Payer: Self-pay | Admitting: Rehabilitative and Restorative Service Providers"

## 2023-03-21 ENCOUNTER — Ambulatory Visit: Payer: PPO | Admitting: Rehabilitative and Restorative Service Providers"

## 2023-03-21 DIAGNOSIS — M6281 Muscle weakness (generalized): Secondary | ICD-10-CM

## 2023-03-21 DIAGNOSIS — R293 Abnormal posture: Secondary | ICD-10-CM

## 2023-03-21 DIAGNOSIS — M25512 Pain in left shoulder: Secondary | ICD-10-CM | POA: Diagnosis not present

## 2023-03-21 DIAGNOSIS — G8929 Other chronic pain: Secondary | ICD-10-CM

## 2023-03-21 DIAGNOSIS — R29898 Other symptoms and signs involving the musculoskeletal system: Secondary | ICD-10-CM

## 2023-03-21 NOTE — Therapy (Signed)
OUTPATIENT PHYSICAL THERAPY SHOULDER TREATMENT   Patient Name: Tara Campbell MRN: [id number] DOB:Sep 20, 1946, 77 y.o., female Today's Date: 03/21/2023  END OF SESSION:  PT End of Session - 03/21/23 1149     Visit Number 5    Number of Visits 16    Date for PT Re-Evaluation 05/02/23    Authorization Type [employer] $10 copay    Progress Note Due on Visit 10    PT Start Time 1146    PT Stop Time 1235    PT Time Calculation (min) 49 min    Activity Tolerance Patient tolerated treatment well             Past Medical History:  Diagnosis Date   Allergy    Arthritis    Asthma    Colon polyps    Coronary artery disease    Diabetes mellitus without complication    Gout    Heart attack 07/30/2016   PCI, 2 stents   Hyperlipidemia    Hypertension    Internal hemorrhoids    Left rotator cuff tear    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   Otomycosis of right ear 09/27/2017   Paroxysmal atrial fibrillation    Thyroid disease    Trochanteric bursitis of right hip 05/29/2017   Past Surgical History:  Procedure Laterality Date   ANGIOPLASTY     CARDIAC CATHETERIZATION     COLONOSCOPY W/ POLYPECTOMY  01/04/2015   CORONARY STENT INTERVENTION N/A 08/31/2022   Procedure: CORONARY STENT INTERVENTION;  Surgeon: Tonny Bollman, MD;  Location: [facility] INVASIVE CV LAB;  Service: Cardiovascular;  Laterality: N/A;   HEMORRHOID BANDING     LEFT HEART CATH AND CORONARY ANGIOGRAPHY N/A 04/26/2021   Procedure: LEFT HEART CATH AND CORONARY ANGIOGRAPHY;  Surgeon: Tonny Bollman, MD;  Location: [facility] INVASIVE CV LAB;  Service: Cardiovascular;  Laterality: N/A;   LEFT HEART CATH AND CORONARY ANGIOGRAPHY N/A 09/14/2022   Procedure: LEFT HEART CATH AND CORONARY ANGIOGRAPHY;  Surgeon: Tonny Bollman, MD;  Location: [facility] INVASIVE CV LAB;  Service: Cardiovascular;  Laterality: N/A;   Patient Active Problem List   Diagnosis Date Noted   Coughing 01/17/2023   NSTEMI (non-ST elevated myocardial  infarction) 09/14/2022   Hypokalemia 09/14/2022   Non-STEMI (non-ST elevated myocardial infarction) 09/14/2022   GAD (generalized anxiety disorder) 09/10/2022   Primary osteoarthritis of right knee 08/16/2022   Hypervitaminosis 01/03/2022   Memory impairment 01/03/2022   TIA (transient ischemic attack) 12/26/2021   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 10/02/2021   Obesity (BMI 30-39.9) 09/06/2021   Angina pectoris 06/23/2021   Hyperlipidemia 04/27/2021   History of non-ST elevation myocardial infarction (NSTEMI) 04/25/2021   Hilar adenopathy 10/13/2018   Mediastinal adenopathy 10/13/2018   Dupuytren's contracture of left hand 08/28/2018   Trigger finger, left index finger 08/28/2018   Non-seasonal allergic rhinitis 08/26/2018   Type 2 diabetes mellitus with diabetic chronic kidney disease 08/06/2018   Controlled diabetes mellitus type 2 with complications 12/27/2017   Class 1 obesity due to excess calories with serious comorbidity in adult 12/27/2017   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   Encounter for long-term (current) use of medications 11/28/2017   Anticoagulant long-term use - on eliquis for PAF 10/21/2017   Sensorineural hearing loss (SNHL) of both ears 09/27/2017   Hypertension goal BP (blood pressure) < 140/90 08/29/2017   CAD (coronary artery disease) 08/29/2017   Gout of ankle 04/08/2017   History of myocardial infarct at age greater than 60 years 01/23/2017  Mixed diabetic hyperlipidemia associated with type 2 diabetes mellitus 01/23/2017   Acquired hypothyroidism 01/23/2017   Severe persistent allergic asthma 01/16/2017   Paroxysmal atrial fibrillation 01/16/2017   Cardiomegaly 01/16/2017    PCP: Dr Nani Gasser  REFERRING PROVIDER: Dr Nani Gasser  REFERRING DIAG: Chronic Lt shoulder pain   THERAPY DIAG:  Chronic left shoulder pain  Other symptoms and signs involving the musculoskeletal system  Muscle weakness  (generalized)  Abnormal posture  Rationale for Evaluation and Treatment: Rehabilitation  ONSET DATE: 01/31/22   SUBJECTIVE:                                                                                                                                                                                      SUBJECTIVE STATEMENT: Patient reports that her shoulder was feeling better than it was after the dog pulled on her arm while she was walking her Saturday. Using TENS which helps. She has minimal soreness following the dry needling and manual work. Working on exercise at home and has borrowed her sister's TENS unit and that really helps.   Eval:Patient reports that she has had Lt shoulder pain for years. She was diagnosed with degenerative changes in the Lt shoulder and rotator cuff changes ~ 8 years ago. Symptoms have increased in the past year.   Hand dominance: Right  PERTINENT HISTORY: HTN; MI x 2 in 2017; Lt shoulder pain; arthritic hip and knee  PAIN:  Are you having pain? Yes: NPRS scale: 4/10 Pain location: Lt shoulder  Pain description: tender  Aggravating factors: worse at night; lifting arm or moving the arm; putting on clothes  Relieving factors: tylenol   PRECAUTIONS: None  WEIGHT BEARING RESTRICTIONS: No  FALLS:  Has patient fallen in last 6 months? No  LIVING ENVIRONMENT: Lives with: lives with their family and lives alone Lives in: House/apartment  OCCUPATION: Retired; sedentary   PATIENT GOALS:decrease Lt shoulder pain and gain strength in Lt arm   NEXT MD VISIT: 06/07/23  OBJECTIVE:   PATIENT SURVEYS:  FOTO 50 goal 60  POSTURE: Patient presents with head forward posture with increased thoracic kyphosis; shoulders rounded and elevated; scapulae abducted and rotated along the thoracic spine; head of the humerus anterior in orientation.   UPPER EXTREMITY ROM:   Active ROM Right eval Left eval  Shoulder flexion 139 100  Shoulder extension 67 37   Shoulder abduction 138 70  Shoulder adduction    Shoulder internal rotation Thumb T9 Hand to Lt lateral sacrum  Shoulder external rotation 51 8  Elbow flexion    Elbow extension    Wrist flexion    Wrist extension    Wrist  ulnar deviation    Wrist radial deviation    Wrist pronation    Wrist supination    (Blank rows = not tested)  UPPER EXTREMITY MMT: Lt UE strength assessed UE at side; unable to assess in MMT positions   MMT Right eval Left eval  Shoulder flexion 4 3-  Shoulder extension 5 4  Shoulder abduction 4 2+  Shoulder adduction    Shoulder internal rotation 5 2+  Shoulder external rotation 4 2+  Middle trapezius 4 3-  Lower trapezius 4 3-  Elbow flexion    Elbow extension    Wrist flexion    Wrist extension    Wrist ulnar deviation    Wrist radial deviation    Wrist pronation    Wrist supination    Grip strength (lbs)    (Blank rows = not tested)  JOINT MOBILITY TESTING:  Decreased mobility Lt shoulder   PALPATION:  Muscular tightness Lt pecs; biceps; upper trap; leveator; periscapular musculature; deltoid    [facility] Adult PT Treatment:                                                DATE: 03/21/23 Therapeutic Exercise: Pulley flexion 10 sec x 5 Pulley scaption 10 sec x 5  Pulley shoulder horizontal ab/adduction x 10 Chin tuck w/ noodle 10 sec x 5 Scap squeeze w/noodle 10 sec x 5  L's w/noodle 3 sec x 10 x 2  L's w/noodle yellow TB 2-3 sec 10 x 2 W's w/ noodle 3 sec x 10 x 2  Corner stretch lower and mid level 30 sec x 2  Isometric row red TB x 5 sec x 10  Isometric shoulder ER red TB x 3 sec x 10  Isometric shoulder IR red TB x 3 sec x 10  Shoulder rolls backward  Supine scap squeeze 5 sec x 10  Manual Therapy: Soft tissue mobilization Rt anterior shoulder; upper trap  Neuromuscular re-ed: Postural education and instruction focus on engaging posterior shoulder girdle musculature Modalities: TENS(home)  Moist heat Rt shoulder x 10 min  Self  Care: Modifying recliner to improve posture when sitting  Use of pillows to prop phone, tablet, book when sitting Support for hand when using phone   [facility] Adult PT Treatment:     DATE: 03/18/23(Dry Needling) Therapeutic Exercise: Pulley flexion 10 sec x 5 Pulley scaption 10 sec x 5  Pulley shoulder horizontal ab/adduction x 10 Chin tuck w/ noodle 10 sec x 5 Scap squeeze w/noodle 10 sec x 5  L's w/noodle 3 sec x 10 x 2  L's w/noodle yellow TB 2-3 sec 10 x 2 W's w/ noodle 3 sec x 10 x 2  Isometric shoulder extension Lt UE on noodle 3 sec x 10  Isometric shoulder ER 3 sec x 10  Shoulder rolls backward  Supine scap squeeze 5 sec x 10  Manual Therapy: Soft tissue mobilization Rt anterior shoulder; upper trap Skilled palpation to assess response to DN and manual work  Psychiatrist  Treatment instructions: Expect mild to moderate muscle soreness. S/S of pneumothorax if dry needled over a lung field, and to seek immediate medical attention should they occur. Patient verbalized understanding of these instructions and education. Patient Consent Given: Yes Education handout provided: Previously provided Muscles treated: pecs; biceps tendon; upper trap Lt  Electrical stimulation performed: No  Parameters: N/A Treatment response/outcome: decreased palpable tightness noted following dry needling and manual work   Neuromuscular re-ed: Postural education and instruction focus on engaging posterior shoulder girdle musculature Modalities: TENS(home)  Moist heat Rt shoulder x 10 min  Self Care: Modifying recliner to improve posture when sitting  Use of pillows to prop phone, tablet, book when sitting Support for hand when using phone   PATIENT EDUCATION: Education details: POC; HEP Person educated: Patient Education method: Explanation, Demonstration, Tactile cues, Verbal cues, and Handouts Education comprehension: verbalized understanding, returned demonstration, verbal cues  required, tactile cues required, and needs further education  HOME EXERCISE PROGRAM: Access Code: [email or web address] URL: [email or web address] Date: 03/21/2023 Prepared by: Corlis Leak  Exercises - Seated Shoulder Flexion AAROM with Pulley Behind  - 2 x daily - 7 x weekly - 1 sets - 10 reps - 10 sec  hold - Seated Shoulder Scaption AAROM with Pulley at Side  - 2 x daily - 7 x weekly - 1 sets - 10 reps - 10sec  hold - Seated Cervical Retraction  - 3 x daily - 7 x weekly - 1 sets - 10 reps - 5-10 hold - Standing Scapular Retraction  - 3 x daily - 7 x weekly - 1 sets - 10 reps - 10 hold - Shoulder External Rotation and Scapular Retraction  - 3 x daily - 7 x weekly - 1 sets - 10 reps -   hold - Supine Scapular Retraction  - 2 x daily - 7 x weekly - 1 sets - 10 reps - 5-10 sec  hold - Shoulder External Rotation and Scapular Retraction with Resistance  - 2 x daily - 7 x weekly - 1 sets - 10 reps - 3-5 sec  hold - Isometric Shoulder Extension at Wall  - 2 x daily - 7 x weekly - 1 sets - 5-10 reps - 5 sec  hold - Isometric Shoulder External Rotation at Wall  - 2 x daily - 7 x weekly - 1 sets - 5 reps - 5 sec  hold - Standing Backward Shoulder Rolls  - 2 x daily - 7 x weekly - 1 sets - 10 reps - 1-2 sec  hold - Corner Pec Major Stretch  - 2 x daily - 7 x weekly - 1 sets - 3 reps - 30 sec  hold - Standing Shoulder Row Reactive Isometric  - 1 x daily - 7 x weekly - 1-2 sets - 10 reps - 5 sec  hold - External Rotation Reactive Isometrics with Flex Bar  - 1 x daily - 7 x weekly - 1-2 sets - 10 reps - 5 sec  hold - Shoulder Internal Rotation Reactive Isometrics  - 1 x daily - 7 x weekly - 1-2 sets - 10 reps - 5 sec  hold  Patient Education - TENS Unit - Trigger Point Dry Needling  ASSESSMENT:  CLINICAL IMPRESSION: Decreased shoulder pain today from being pulled by dog while walking her on Saturday. Note decreased palpable tightness through Rt shoulder girdle. Added gentle pec stretch in the  corner and continued with strengthening adding resistive exercises in standing. Patient responds well to exercises and DN/manual work. Working on posture and alignment as well as review of exercises. Will progress with posterior shoulder girdle and shoulder strengthening as tolerated.    OBJECTIVE IMPAIRMENTS: Lt shoulder pain for the past 10 or more years with increased symptoms in the past year. [name] has poor posture and alignment; limited AROM, decreased strength;  decreased functional activity and ADL's using Lt UE; pain; difficulty sleeping due to Lt shoulder pain decreased activity tolerance, decreased mobility, decreased ROM, decreased strength, hypomobility, increased fascial restrictions, impaired flexibility, impaired UE functional use, improper body mechanics, postural dysfunction, and pain.    GOALS: Goals reviewed with patient? Yes  SHORT TERM GOALS: Target date: 04/04/2023   Independent in initial HEP  Baseline: Goal status: INITIAL  2.  Improve posture and alignment with patient to demonstrate improved upright posture with posterior shoulder girdle musculature  Baseline:  Goal status: INITIAL   LONG TERM GOALS: Target date: 05/02/2023   Patient reports decreased pain Lt shoulder by 50% allowing her to sleep for longer periods of time(3-4 hours) without awaking due to pain Baseline:  Goal status: INITIAL  2.  Increase strength Lt shoulder to 3/5 to 4/5  Baseline:  Goal status: INITIAL  3.  Increase AROM Lt shoulder by 5-10 degrees  Baseline:  Goal status: INITIAL  4.  Patient demonstrates/reports improved functional use of Lt UE for ADL's including light household chores  Baseline:  Goal status: INITIAL  5.  Independent in HEP (including aquatic program as indicated) Baseline:  Goal status: INITIAL  6.  Improve functional limitation score to 60  Baseline:  Goal status: INITIAL  PLAN:  PT FREQUENCY: 2x/week  PT DURATION: 8 weeks  PLANNED INTERVENTIONS:  Therapeutic exercises, Therapeutic activity, Neuromuscular re-education, Patient/Family education, Self Care, Joint mobilization, Aquatic Therapy, Dry Needling, Electrical stimulation, Cryotherapy, Moist heat, Taping, Vasopneumatic device, Ultrasound, Ionotophoresis 4mg /ml Dexamethasone, Manual therapy, and Re-evaluation  PLAN FOR NEXT SESSION: review and progress with exercises and postural correction; manual work, DN, modalities as indicated    [employer], PT 03/21/2023, 11:51 AM

## 2023-03-25 ENCOUNTER — Encounter: Payer: Self-pay | Admitting: Rehabilitative and Restorative Service Providers"

## 2023-03-25 ENCOUNTER — Ambulatory Visit: Payer: PPO | Admitting: Rehabilitative and Restorative Service Providers"

## 2023-03-25 DIAGNOSIS — M25512 Pain in left shoulder: Secondary | ICD-10-CM | POA: Diagnosis not present

## 2023-03-25 DIAGNOSIS — G8929 Other chronic pain: Secondary | ICD-10-CM

## 2023-03-25 DIAGNOSIS — R29898 Other symptoms and signs involving the musculoskeletal system: Secondary | ICD-10-CM

## 2023-03-25 DIAGNOSIS — R293 Abnormal posture: Secondary | ICD-10-CM

## 2023-03-25 DIAGNOSIS — M6281 Muscle weakness (generalized): Secondary | ICD-10-CM

## 2023-03-25 NOTE — Therapy (Signed)
OUTPATIENT PHYSICAL THERAPY SHOULDER TREATMENT   Patient Name: Tara Campbell MRN: [id number] DOB:[birth year], 77 y.o., female Today's Date: 03/25/2023  END OF SESSION:  PT End of Session - 03/25/23 1150     Visit Number 6    Number of Visits 16    Date for PT Re-Evaluation 05/02/23    Authorization Type [employer] $10 copay    Progress Note Due on Visit 10    PT Start Time 1148    PT Stop Time 1237    PT Time Calculation (min) 49 min    Activity Tolerance Patient tolerated treatment well             Past Medical History:  Diagnosis Date   Allergy    Arthritis    Asthma    Colon polyps    Coronary artery disease    Diabetes mellitus without complication    Gout    Heart attack 07/30/2016   PCI, 2 stents   Hyperlipidemia    Hypertension    Internal hemorrhoids    Left rotator cuff tear    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   Otomycosis of right ear 09/27/2017   Paroxysmal atrial fibrillation    Thyroid disease    Trochanteric bursitis of right hip 05/29/2017   Past Surgical History:  Procedure Laterality Date   ANGIOPLASTY     CARDIAC CATHETERIZATION     COLONOSCOPY W/ POLYPECTOMY  01/04/2015   CORONARY STENT INTERVENTION N/A 08/31/2022   Procedure: CORONARY STENT INTERVENTION;  Surgeon: Tonny Bollman, MD;  Location: [facility] INVASIVE CV LAB;  Service: Cardiovascular;  Laterality: N/A;   HEMORRHOID BANDING     LEFT HEART CATH AND CORONARY ANGIOGRAPHY N/A 04/26/2021   Procedure: LEFT HEART CATH AND CORONARY ANGIOGRAPHY;  Surgeon: Tonny Bollman, MD;  Location: [facility] INVASIVE CV LAB;  Service: Cardiovascular;  Laterality: N/A;   LEFT HEART CATH AND CORONARY ANGIOGRAPHY N/A 09/14/2022   Procedure: LEFT HEART CATH AND CORONARY ANGIOGRAPHY;  Surgeon: Tonny Bollman, MD;  Location: [facility] INVASIVE CV LAB;  Service: Cardiovascular;  Laterality: N/A;   Patient Active Problem List   Diagnosis Date Noted   Coughing 01/17/2023   NSTEMI (non-ST elevated myocardial  infarction) 09/14/2022   Hypokalemia 09/14/2022   Non-STEMI (non-ST elevated myocardial infarction) 09/14/2022   GAD (generalized anxiety disorder) 09/10/2022   Primary osteoarthritis of right knee 08/16/2022   Hypervitaminosis 01/03/2022   Memory impairment 01/03/2022   TIA (transient ischemic attack) 12/26/2021   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 10/02/2021   Obesity (BMI 30-39.9) 09/06/2021   Angina pectoris 06/23/2021   Hyperlipidemia 04/27/2021   History of non-ST elevation myocardial infarction (NSTEMI) 04/25/2021   Hilar adenopathy 10/13/2018   Mediastinal adenopathy 10/13/2018   Dupuytren's contracture of left hand 08/28/2018   Trigger finger, left index finger 08/28/2018   Non-seasonal allergic rhinitis 08/26/2018   Type 2 diabetes mellitus with diabetic chronic kidney disease 08/06/2018   Controlled diabetes mellitus type 2 with complications 12/27/2017   Class 1 obesity due to excess calories with serious comorbidity in adult 12/27/2017   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   Encounter for long-term (current) use of medications 11/28/2017   Anticoagulant long-term use - on eliquis for PAF 10/21/2017   Sensorineural hearing loss (SNHL) of both ears 09/27/2017   Hypertension goal BP (blood pressure) < 140/90 08/29/2017   CAD (coronary artery disease) 08/29/2017   Gout of ankle 04/08/2017   History of myocardial infarct at age greater than 60 years 01/23/2017  Mixed diabetic hyperlipidemia associated with type 2 diabetes mellitus 01/23/2017   Acquired hypothyroidism 01/23/2017   Severe persistent allergic asthma 01/16/2017   Paroxysmal atrial fibrillation 01/16/2017   Cardiomegaly 01/16/2017    PCP: Dr Nani Gasser  REFERRING PROVIDER: Dr Nani Gasser  REFERRING DIAG: Chronic Lt shoulder pain   THERAPY DIAG:  Chronic left shoulder pain  Other symptoms and signs involving the musculoskeletal system  Muscle weakness  (generalized)  Abnormal posture  Rationale for Evaluation and Treatment: Rehabilitation  ONSET DATE: 01/31/22   SUBJECTIVE:                                                                                                                                                                                      SUBJECTIVE STATEMENT: Patient reports that her shoulder is "tight" from lifting heavy bag yesterday. No increase in pain. Using the TENs unit at home which helps. She has minimal soreness following the dry needling and manual work.    Eval:Patient reports that she has had Lt shoulder pain for years. She was diagnosed with degenerative changes in the Lt shoulder and rotator cuff changes ~ 8 years ago. Symptoms have increased in the past year.   Hand dominance: Right  PERTINENT HISTORY: HTN; MI x 2 in 2017; Lt shoulder pain; arthritic hip and knee  PAIN:  Are you having pain? Yes: NPRS scale: 2-3/10 Pain location: Lt shoulder  Pain description: tender  Aggravating factors: worse at night; lifting arm or moving the arm; putting on clothes  Relieving factors: tylenol   PRECAUTIONS: None  WEIGHT BEARING RESTRICTIONS: No  FALLS:  Has patient fallen in last 6 months? No  LIVING ENVIRONMENT: Lives with: lives with their family and lives alone Lives in: House/apartment  OCCUPATION: Retired; sedentary   PATIENT GOALS:decrease Lt shoulder pain and gain strength in Lt arm   NEXT MD VISIT: 06/07/23  OBJECTIVE:   PATIENT SURVEYS:  FOTO 50 goal 60  POSTURE: Patient presents with head forward posture with increased thoracic kyphosis; shoulders rounded and elevated; scapulae abducted and rotated along the thoracic spine; head of the humerus anterior in orientation.   UPPER EXTREMITY ROM:   Active ROM Right eval Left eval  Shoulder flexion 139 100  Shoulder extension 67 37  Shoulder abduction 138 70  Shoulder adduction    Shoulder internal rotation Thumb T9 Hand to Lt lateral  sacrum  Shoulder external rotation 51 8  Elbow flexion    Elbow extension    Wrist flexion    Wrist extension    Wrist ulnar deviation    Wrist radial deviation    Wrist pronation    Wrist supination    (  Blank rows = not tested)  UPPER EXTREMITY MMT: Lt UE strength assessed UE at side; unable to assess in MMT positions   MMT Right eval Left eval  Shoulder flexion 4 3-  Shoulder extension 5 4  Shoulder abduction 4 2+  Shoulder adduction    Shoulder internal rotation 5 2+  Shoulder external rotation 4 2+  Middle trapezius 4 3-  Lower trapezius 4 3-  Elbow flexion    Elbow extension    Wrist flexion    Wrist extension    Wrist ulnar deviation    Wrist radial deviation    Wrist pronation    Wrist supination    Grip strength (lbs)    (Blank rows = not tested)  JOINT MOBILITY TESTING:  Decreased mobility Lt shoulder   PALPATION:  Muscular tightness Lt pecs; biceps; upper trap; leveator; periscapular musculature; deltoid    [facility] Adult PT Treatment:                                                DATE: 03/25/23 Therapeutic Exercise: Pulley flexion 10 sec x 5 Pulley scaption 10 sec x 5  Pulley shoulder horizontal ab/adduction x 10 Chin tuck w/ noodle 10 sec x 5 Scap squeeze w/noodle 10 sec x 5  L's w/noodle 3 sec x 10 x 2  L's w/noodle yellow TB 2-3 sec 10 x 2 W's w/ noodle 3 sec x 10 x 2  Corner stretch lower and mid level 30 sec x 2  Isometric row red TB x 5 sec x 10  Isometric shoulder ER red TB x 3 sec x 10  Isometric shoulder IR red TB x 3 sec x 10  Shoulder rolls backward  Supine scap squeeze 5 sec x 10  Manual Therapy: Soft tissue mobilization Rt anterior shoulder; upper trap Skilled palpation to assess response to DN and manual work  Psychiatrist  Treatment instructions: Expect mild to moderate muscle soreness. S/S of pneumothorax if dry needled over a lung field, and to seek immediate medical attention should they occur. Patient verbalized  understanding of these instructions and education. Patient Consent Given: Yes Education handout provided: Previously provided Muscles treated: Lt pecs; biceps tendon; upper trap   Electrical stimulation performed: No Parameters: N/A Treatment response/outcome: decreased palpable tightness noted following dry needling and manual work   Neuromuscular re-ed: Postural education and instruction focus on engaging posterior shoulder girdle musculature Modalities: TENS(home)  Moist heat Rt shoulder x 10 min  Self Care: Modifying recliner to improve posture when sitting  Use of pillows to prop phone, tablet, book when sitting  [facility] Adult PT Treatment:                                                DATE: 03/21/23 Therapeutic Exercise: Pulley flexion 10 sec x 5 Pulley scaption 10 sec x 5  Pulley shoulder horizontal ab/adduction x 10 Chin tuck w/ noodle 10 sec x 5 Scap squeeze w/noodle 10 sec x 5  L's w/noodle 3 sec x 10 x 2  L's w/noodle yellow TB 2-3 sec 10 x 2 W's w/ noodle 3 sec x 10 x 2  Corner stretch lower and mid level 30 sec x 2  Isometric  row red TB x 5 sec x 10  Isometric shoulder ER red TB x 3 sec x 10  Isometric shoulder IR red TB x 3 sec x 10  Shoulder rolls backward  Supine scap squeeze 5 sec x 10  Manual Therapy: Soft tissue mobilization Rt anterior shoulder; upper trap  Neuromuscular re-ed: Postural education and instruction focus on engaging posterior shoulder girdle musculature Modalities: TENS(home)  Moist heat Rt shoulder x 10 min  Self Care: Modifying recliner to improve posture when sitting  Use of pillows to prop phone, tablet, book when sitting Support for hand when using phone   PATIENT EDUCATION: Education details: POC; HEP Person educated: Patient Education method: Explanation, Demonstration, Tactile cues, Verbal cues, and Handouts Education comprehension: verbalized understanding, returned demonstration, verbal cues required, tactile cues required,  and needs further education  HOME EXERCISE PROGRAM: Access Code: [email or web address] URL: [email or web address] Date: 03/21/2023 Prepared by: Corlis Leak  Exercises - Seated Shoulder Flexion AAROM with Pulley Behind  - 2 x daily - 7 x weekly - 1 sets - 10 reps - 10 sec  hold - Seated Shoulder Scaption AAROM with Pulley at Side  - 2 x daily - 7 x weekly - 1 sets - 10 reps - 10sec  hold - Seated Cervical Retraction  - 3 x daily - 7 x weekly - 1 sets - 10 reps - 5-10 hold - Standing Scapular Retraction  - 3 x daily - 7 x weekly - 1 sets - 10 reps - 10 hold - Shoulder External Rotation and Scapular Retraction  - 3 x daily - 7 x weekly - 1 sets - 10 reps -   hold - Supine Scapular Retraction  - 2 x daily - 7 x weekly - 1 sets - 10 reps - 5-10 sec  hold - Shoulder External Rotation and Scapular Retraction with Resistance  - 2 x daily - 7 x weekly - 1 sets - 10 reps - 3-5 sec  hold - Isometric Shoulder Extension at Wall  - 2 x daily - 7 x weekly - 1 sets - 5-10 reps - 5 sec  hold - Isometric Shoulder External Rotation at Wall  - 2 x daily - 7 x weekly - 1 sets - 5 reps - 5 sec  hold - Standing Backward Shoulder Rolls  - 2 x daily - 7 x weekly - 1 sets - 10 reps - 1-2 sec  hold - Corner Pec Major Stretch  - 2 x daily - 7 x weekly - 1 sets - 3 reps - 30 sec  hold - Standing Shoulder Row Reactive Isometric  - 1 x daily - 7 x weekly - 1-2 sets - 10 reps - 5 sec  hold - External Rotation Reactive Isometrics with Flex Bar  - 1 x daily - 7 x weekly - 1-2 sets - 10 reps - 5 sec  hold - Shoulder Internal Rotation Reactive Isometrics  - 1 x daily - 7 x weekly - 1-2 sets - 10 reps - 5 sec  hold  Patient Education - TENS Unit - Trigger Point Dry Needling  ASSESSMENT:  CLINICAL IMPRESSION: Increased tightness and stiffness inn Lt shoulder but decreased shoulder pain today from lifting and carrying a heavy bag yesterday. Note continued palpable tightness through Rt shoulder girdle. Continued gentle pec  stretch in the corner and continued with strengthening including resistive exercises in standing. Patient responds well to exercises and DN/manual work. Working on posture and alignment as well as review of  exercises. Will progress with posterior shoulder girdle and shoulder strengthening as tolerated.    OBJECTIVE IMPAIRMENTS: Lt shoulder pain for the past 10 or more years with increased symptoms in the past year. Tara Campbell has poor posture and alignment; limited AROM, decreased strength; decreased functional activity and ADL's using Lt UE; pain; difficulty sleeping due to Lt shoulder pain decreased activity tolerance, decreased mobility, decreased ROM, decreased strength, hypomobility, increased fascial restrictions, impaired flexibility, impaired UE functional use, improper body mechanics, postural dysfunction, and pain.    GOALS: Goals reviewed with patient? Yes  SHORT TERM GOALS: Target date: 04/04/2023   Independent in initial HEP  Baseline: Goal status: INITIAL  2.  Improve posture and alignment with patient to demonstrate improved upright posture with posterior shoulder girdle musculature  Baseline:  Goal status: INITIAL   LONG TERM GOALS: Target date: 05/02/2023   Patient reports decreased pain Lt shoulder by 50% allowing her to sleep for longer periods of time(3-4 hours) without awaking due to pain Baseline:  Goal status: INITIAL  2.  Increase strength Lt shoulder to 3/5 to 4/5  Baseline:  Goal status: INITIAL  3.  Increase AROM Lt shoulder by 5-10 degrees  Baseline:  Goal status: INITIAL  4.  Patient demonstrates/reports improved functional use of Lt UE for ADL's including light household chores  Baseline:  Goal status: INITIAL  5.  Independent in HEP (including aquatic program as indicated) Baseline:  Goal status: INITIAL  6.  Improve functional limitation score to 60  Baseline:  Goal status: INITIAL  PLAN:  PT FREQUENCY: 2x/week  PT DURATION: 8  weeks  PLANNED INTERVENTIONS: Therapeutic exercises, Therapeutic activity, Neuromuscular re-education, Patient/Family education, Self Care, Joint mobilization, Aquatic Therapy, Dry Needling, Electrical stimulation, Cryotherapy, Moist heat, Taping, Vasopneumatic device, Ultrasound, Ionotophoresis 4mg /ml Dexamethasone, Manual therapy, and Re-evaluation  PLAN FOR NEXT SESSION: review and progress with exercises and postural correction; manual work, DN, modalities as indicated    [employer], PT 03/25/2023, 11:52 AM

## 2023-03-28 ENCOUNTER — Ambulatory Visit: Payer: PPO | Admitting: Rehabilitative and Restorative Service Providers"

## 2023-03-28 ENCOUNTER — Encounter: Payer: Self-pay | Admitting: Rehabilitative and Restorative Service Providers"

## 2023-03-28 DIAGNOSIS — M6281 Muscle weakness (generalized): Secondary | ICD-10-CM

## 2023-03-28 DIAGNOSIS — G8929 Other chronic pain: Secondary | ICD-10-CM

## 2023-03-28 DIAGNOSIS — R293 Abnormal posture: Secondary | ICD-10-CM

## 2023-03-28 DIAGNOSIS — R29898 Other symptoms and signs involving the musculoskeletal system: Secondary | ICD-10-CM

## 2023-03-28 DIAGNOSIS — M25512 Pain in left shoulder: Secondary | ICD-10-CM | POA: Diagnosis not present

## 2023-03-28 NOTE — Therapy (Signed)
OUTPATIENT PHYSICAL THERAPY SHOULDER TREATMENT   Patient Name: Tara Campbell MRN: [id number] DOB:Feb 29, 1946, 77 y.o., female Today's Date: 03/28/2023  END OF SESSION:  PT End of Session - 03/28/23 1146     Visit Number 7    Number of Visits 16    Date for PT Re-Evaluation 05/02/23    Authorization Type [employer] $10 copay    Progress Note Due on Visit 10    PT Start Time 1146    PT Stop Time 1234    PT Time Calculation (min) 48 min             Past Medical History:  Diagnosis Date   Allergy    Arthritis    Asthma    Colon polyps    Coronary artery disease    Diabetes mellitus without complication    Gout    Heart attack 07/30/2016   PCI, 2 stents   Hyperlipidemia    Hypertension    Internal hemorrhoids    Left rotator cuff tear    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   Otomycosis of right ear 09/27/2017   Paroxysmal atrial fibrillation    Thyroid disease    Trochanteric bursitis of right hip 05/29/2017   Past Surgical History:  Procedure Laterality Date   ANGIOPLASTY     CARDIAC CATHETERIZATION     COLONOSCOPY W/ POLYPECTOMY  01/04/2015   CORONARY STENT INTERVENTION N/A 08/31/2022   Procedure: CORONARY STENT INTERVENTION;  Surgeon: Tonny Bollman, MD;  Location: [facility] INVASIVE CV LAB;  Service: Cardiovascular;  Laterality: N/A;   HEMORRHOID BANDING     LEFT HEART CATH AND CORONARY ANGIOGRAPHY N/A 04/26/2021   Procedure: LEFT HEART CATH AND CORONARY ANGIOGRAPHY;  Surgeon: Tonny Bollman, MD;  Location: [facility] INVASIVE CV LAB;  Service: Cardiovascular;  Laterality: N/A;   LEFT HEART CATH AND CORONARY ANGIOGRAPHY N/A 09/14/2022   Procedure: LEFT HEART CATH AND CORONARY ANGIOGRAPHY;  Surgeon: Tonny Bollman, MD;  Location: [facility] INVASIVE CV LAB;  Service: Cardiovascular;  Laterality: N/A;   Patient Active Problem List   Diagnosis Date Noted   Coughing 01/17/2023   NSTEMI (non-ST elevated myocardial infarction) 09/14/2022   Hypokalemia 09/14/2022   Non-STEMI  (non-ST elevated myocardial infarction) 09/14/2022   GAD (generalized anxiety disorder) 09/10/2022   Primary osteoarthritis of right knee 08/16/2022   Hypervitaminosis 01/03/2022   Memory impairment 01/03/2022   TIA (transient ischemic attack) 12/26/2021   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 10/02/2021   Obesity (BMI 30-39.9) 09/06/2021   Angina pectoris 06/23/2021   Hyperlipidemia 04/27/2021   History of non-ST elevation myocardial infarction (NSTEMI) 04/25/2021   Hilar adenopathy 10/13/2018   Mediastinal adenopathy 10/13/2018   Dupuytren's contracture of left hand 08/28/2018   Trigger finger, left index finger 08/28/2018   Non-seasonal allergic rhinitis 08/26/2018   Type 2 diabetes mellitus with diabetic chronic kidney disease 08/06/2018   Controlled diabetes mellitus type 2 with complications 12/27/2017   Class 1 obesity due to excess calories with serious comorbidity in adult 12/27/2017   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   Encounter for long-term (current) use of medications 11/28/2017   Anticoagulant long-term use - on eliquis for PAF 10/21/2017   Sensorineural hearing loss (SNHL) of both ears 09/27/2017   Hypertension goal BP (blood pressure) < 140/90 08/29/2017   CAD (coronary artery disease) 08/29/2017   Gout of ankle 04/08/2017   History of myocardial infarct at age greater than 60 years 01/23/2017   Mixed diabetic hyperlipidemia associated with type 2 diabetes  mellitus 01/23/2017   Acquired hypothyroidism 01/23/2017   Severe persistent allergic asthma 01/16/2017   Paroxysmal atrial fibrillation 01/16/2017   Cardiomegaly 01/16/2017    PCP: Dr Nani Gasser  REFERRING PROVIDER: Dr Nani Gasser  REFERRING DIAG: Chronic Lt shoulder pain   THERAPY DIAG:  Chronic left shoulder pain  Other symptoms and signs involving the musculoskeletal system  Muscle weakness (generalized)  Abnormal posture  Rationale for Evaluation and  Treatment: Rehabilitation  ONSET DATE: 01/31/22   SUBJECTIVE:                                                                                                                                                                                      SUBJECTIVE STATEMENT: Patient reports that her shoulder is feeling much better today. "I think we turned a corner". Using the TENs unit at home which helps.    Eval:Patient reports that she has had Lt shoulder pain for years. She was diagnosed with degenerative changes in the Lt shoulder and rotator cuff changes ~ 8 years ago. Symptoms have increased in the past year.   Hand dominance: Right  PERTINENT HISTORY: HTN; MI x 2 in 2017; Lt shoulder pain; arthritic hip and knee  PAIN:  Are you having pain? Yes: NPRS scale: 2/10 Pain location: Lt shoulder  Pain description: tender  Aggravating factors: worse at night; lifting arm or moving the arm; putting on clothes  Relieving factors: tylenol   PRECAUTIONS: None  WEIGHT BEARING RESTRICTIONS: No  FALLS:  Has patient fallen in last 6 months? No  LIVING ENVIRONMENT: Lives with: lives with their family and lives alone Lives in: House/apartment  OCCUPATION: Retired; sedentary   PATIENT GOALS:decrease Lt shoulder pain and gain strength in Lt arm   NEXT MD VISIT: 06/07/23  OBJECTIVE:   PATIENT SURVEYS:  FOTO 50 goal 60  POSTURE: Patient presents with head forward posture with increased thoracic kyphosis; shoulders rounded and elevated; scapulae abducted and rotated along the thoracic spine; head of the humerus anterior in orientation.   UPPER EXTREMITY ROM:   Active ROM Right eval Left eval  Shoulder flexion 139 100  Shoulder extension 67 37  Shoulder abduction 138 70  Shoulder adduction    Shoulder internal rotation Thumb T9 Hand to Lt lateral sacrum  Shoulder external rotation 51 8  Elbow flexion    Elbow extension    Wrist flexion    Wrist extension    Wrist ulnar deviation     Wrist radial deviation    Wrist pronation    Wrist supination    (Blank rows = not tested)  UPPER EXTREMITY MMT: Lt UE strength assessed UE at side; unable  to assess in MMT positions   MMT Right eval Left eval  Shoulder flexion 4 3-  Shoulder extension 5 4  Shoulder abduction 4 2+  Shoulder adduction    Shoulder internal rotation 5 2+  Shoulder external rotation 4 2+  Middle trapezius 4 3-  Lower trapezius 4 3-  Elbow flexion    Elbow extension    Wrist flexion    Wrist extension    Wrist ulnar deviation    Wrist radial deviation    Wrist pronation    Wrist supination    Grip strength (lbs)    (Blank rows = not tested)  JOINT MOBILITY TESTING:  Decreased mobility Lt shoulder   PALPATION:  Muscular tightness Lt pecs; biceps; upper trap; leveator; periscapular musculature; deltoid     [facility] Adult PT Treatment:                                                DATE: 03/28/23 Therapeutic Exercise: Pendulum CW/CCW x 20 Chin tuck w/ noodle 10 sec x 5 Scap squeeze w/noodle 10 sec x 5  L's w/noodle yellow TB 2-3 sec 10 x 2 W's w/ noodle 3 sec x 10 x 2  Corner stretch lower and mid level 30 sec x 2  Isometric row red TB x 5 sec x 10 x 2 Isometric shoulder ER red TB x 3 sec x 10 x 2 Isometric shoulder IR red TB x 3 sec x 10 x 2 Shoulder rolls backward  Standing wall slide w/ pillowcase x 5 x 2 Supine scap squeeze 5 sec x 10  Manual Therapy: Soft tissue mobilization Rt anterior shoulder; upper trap; posterior shoulder - teres  Skilled palpation to assess response to manual work   Neuromuscular re-ed: Postural education and instruction focus on engaging posterior shoulder girdle musculature Modalities: TENS(home)  Cold pack Rt shoulder x 10 min  Self Care: Modifying recliner to improve posture when sitting  Use of pillows to prop phone, tablet, book when sitting  [facility] Adult PT Treatment:      (DRY NEEDLING)     DATE: 03/25/23 Therapeutic Exercise: Pulley flexion 10  sec x 5 Pulley scaption 10 sec x 5  Pulley shoulder horizontal ab/adduction x 10 Chin tuck w/ noodle 10 sec x 5 Scap squeeze w/noodle 10 sec x 5  L's w/noodle 3 sec x 10 x 2  L's w/noodle yellow TB 2-3 sec 10 x 2 W's w/ noodle 3 sec x 10 x 2  Corner stretch lower and mid level 30 sec x 2  Isometric row red TB x 5 sec x 10  Isometric shoulder ER red TB x 3 sec x 10  Isometric shoulder IR red TB x 3 sec x 10  Shoulder rolls backward  Supine scap squeeze 5 sec x 10  Manual Therapy: Soft tissue mobilization Rt anterior shoulder; upper trap Skilled palpation to assess response to DN and manual work  Psychiatrist  Treatment instructions: Expect mild to moderate muscle soreness. S/S of pneumothorax if dry needled over a lung field, and to seek immediate medical attention should they occur. Patient verbalized understanding of these instructions and education. Patient Consent Given: Yes Education handout provided: Previously provided Muscles treated: Lt pecs; biceps tendon; upper trap   Electrical stimulation performed: No Parameters: N/A Treatment response/outcome: decreased palpable tightness noted following dry needling and  manual work   Neuromuscular re-ed: Postural education and instruction focus on engaging posterior shoulder girdle musculature Modalities: TENS(home)  Moist heat Rt shoulder x 10 min  Self Care: Modifying recliner to improve posture when sitting  Use of pillows to prop phone, tablet, book when sitting   PATIENT EDUCATION: Education details: POC; HEP Person educated: Patient Education method: Explanation, Demonstration, Tactile cues, Verbal cues, and Handouts Education comprehension: verbalized understanding, returned demonstration, verbal cues required, tactile cues required, and needs further education  HOME EXERCISE PROGRAM: Access Code: [email or web address] URL: [email or web address] Date: 03/28/2023 Prepared by: Corlis Leak  Exercises -  Seated Shoulder Flexion AAROM with Pulley Behind  - 2 x daily - 7 x weekly - 1 sets - 10 reps - 10 sec  hold - Seated Shoulder Scaption AAROM with Pulley at Side  - 2 x daily - 7 x weekly - 1 sets - 10 reps - 10sec  hold - Seated Cervical Retraction  - 3 x daily - 7 x weekly - 1 sets - 10 reps - 5-10 hold - Standing Scapular Retraction  - 3 x daily - 7 x weekly - 1 sets - 10 reps - 10 hold - Shoulder External Rotation and Scapular Retraction  - 3 x daily - 7 x weekly - 1 sets - 10 reps -   hold - Supine Scapular Retraction  - 2 x daily - 7 x weekly - 1 sets - 10 reps - 5-10 sec  hold - Shoulder External Rotation and Scapular Retraction with Resistance  - 2 x daily - 7 x weekly - 1 sets - 10 reps - 3-5 sec  hold - Isometric Shoulder Extension at Wall  - 2 x daily - 7 x weekly - 1 sets - 5-10 reps - 5 sec  hold - Isometric Shoulder External Rotation at Wall  - 2 x daily - 7 x weekly - 1 sets - 5 reps - 5 sec  hold - Standing Backward Shoulder Rolls  - 2 x daily - 7 x weekly - 1 sets - 10 reps - 1-2 sec  hold - Corner Pec Major Stretch  - 2 x daily - 7 x weekly - 1 sets - 3 reps - 30 sec  hold - Standing Shoulder Row Reactive Isometric  - 1 x daily - 7 x weekly - 1-2 sets - 10 reps - 5 sec  hold - External Rotation Reactive Isometrics with Flex Bar  - 1 x daily - 7 x weekly - 1-2 sets - 10 reps - 5 sec  hold - Shoulder Internal Rotation Reactive Isometrics  - 1 x daily - 7 x weekly - 1-2 sets - 10 reps - 5 sec  hold - Standing shoulder flexion wall slides  - 2 x daily - 7 x weekly - 1 sets - 5-10 reps - 5 sec  hold  Patient Education - TENS Unit - Trigger Point Dry Needling  ASSESSMENT:  CLINICAL IMPRESSION: Good improvement in Lt shoulder pain. Some persistent tightness and stiffness in Lt shoulder with palpable tightness through Rt shoulder girdle. Continued gentle pec stretch in the corner and strengthening including resistive exercises in standing. Patient responds well to exercises and  DN/manual work. Working on posture and alignment as well as review of exercises. Will progress with posterior shoulder girdle and shoulder strengthening as tolerated.    OBJECTIVE IMPAIRMENTS: Lt shoulder pain for the past 10 or more years with increased symptoms in the past year. [name] has poor posture and  alignment; limited AROM, decreased strength; decreased functional activity and ADL's using Lt UE; pain; difficulty sleeping due to Lt shoulder pain decreased activity tolerance, decreased mobility, decreased ROM, decreased strength, hypomobility, increased fascial restrictions, impaired flexibility, impaired UE functional use, improper body mechanics, postural dysfunction, and pain.    GOALS: Goals reviewed with patient? Yes  SHORT TERM GOALS: Target date: 04/04/2023   Independent in initial HEP  Baseline: Goal status: INITIAL  2.  Improve posture and alignment with patient to demonstrate improved upright posture with posterior shoulder girdle musculature  Baseline:  Goal status: INITIAL   LONG TERM GOALS: Target date: 05/02/2023   Patient reports decreased pain Lt shoulder by 50% allowing her to sleep for longer periods of time(3-4 hours) without awaking due to pain Baseline:  Goal status: INITIAL  2.  Increase strength Lt shoulder to 3/5 to 4/5  Baseline:  Goal status: INITIAL  3.  Increase AROM Lt shoulder by 5-10 degrees  Baseline:  Goal status: INITIAL  4.  Patient demonstrates/reports improved functional use of Lt UE for ADL's including light household chores  Baseline:  Goal status: INITIAL  5.  Independent in HEP (including aquatic program as indicated) Baseline:  Goal status: INITIAL  6.  Improve functional limitation score to 60  Baseline:  Goal status: INITIAL  PLAN:  PT FREQUENCY: 2x/week  PT DURATION: 8 weeks  PLANNED INTERVENTIONS: Therapeutic exercises, Therapeutic activity, Neuromuscular re-education, Patient/Family education, Self Care, Joint  mobilization, Aquatic Therapy, Dry Needling, Electrical stimulation, Cryotherapy, Moist heat, Taping, Vasopneumatic device, Ultrasound, Ionotophoresis 4mg /ml Dexamethasone, Manual therapy, and Re-evaluation  PLAN FOR NEXT SESSION: review and progress with exercises and postural correction; manual work, DN, modalities as indicated    [employer], PT 03/28/2023, 11:50 AM

## 2023-04-01 ENCOUNTER — Encounter: Payer: Self-pay | Admitting: Rehabilitative and Restorative Service Providers"

## 2023-04-01 ENCOUNTER — Ambulatory Visit: Payer: PPO | Admitting: Rehabilitative and Restorative Service Providers"

## 2023-04-01 DIAGNOSIS — G8929 Other chronic pain: Secondary | ICD-10-CM

## 2023-04-01 DIAGNOSIS — R29898 Other symptoms and signs involving the musculoskeletal system: Secondary | ICD-10-CM

## 2023-04-01 DIAGNOSIS — R293 Abnormal posture: Secondary | ICD-10-CM

## 2023-04-01 DIAGNOSIS — M6281 Muscle weakness (generalized): Secondary | ICD-10-CM

## 2023-04-01 DIAGNOSIS — M25512 Pain in left shoulder: Secondary | ICD-10-CM | POA: Diagnosis not present

## 2023-04-01 NOTE — Therapy (Signed)
OUTPATIENT PHYSICAL THERAPY SHOULDER TREATMENT   Patient Name: Tara Campbell MRN: [id number] DOB:[birth year], 77 y.o., female Today's Date: 04/01/2023  END OF SESSION:  PT End of Session - 04/01/23 1149     Visit Number 8    Number of Visits 16    Date for PT Re-Evaluation 05/02/23    Authorization Type [employer] $10 copay    Progress Note Due on Visit 10    PT Start Time 1148    PT Stop Time 1239    PT Time Calculation (min) 51 min             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   Otomycosis of right ear 09/27/2017   Paroxysmal atrial fibrillation (HCC)    Thyroid disease    Trochanteric bursitis of right hip 05/29/2017   Past Surgical History:  Procedure Laterality Date   ANGIOPLASTY     CARDIAC CATHETERIZATION     COLONOSCOPY W/ POLYPECTOMY  01/04/2015   CORONARY STENT INTERVENTION N/A 08/31/2022   Procedure: CORONARY STENT INTERVENTION;  Surgeon: Tonny Bollman, MD;  Location: [facility] INVASIVE CV LAB;  Service: Cardiovascular;  Laterality: N/A;   HEMORRHOID BANDING     LEFT HEART CATH AND CORONARY ANGIOGRAPHY N/A 04/26/2021   Procedure: LEFT HEART CATH AND CORONARY ANGIOGRAPHY;  Surgeon: Tonny Bollman, MD;  Location: [facility] INVASIVE CV LAB;  Service: Cardiovascular;  Laterality: N/A;   LEFT HEART CATH AND CORONARY ANGIOGRAPHY N/A 09/14/2022   Procedure: LEFT HEART CATH AND CORONARY ANGIOGRAPHY;  Surgeon: Tonny Bollman, MD;  Location: [facility] INVASIVE CV LAB;  Service: Cardiovascular;  Laterality: N/A;   Patient Active Problem List   Diagnosis Date Noted   Coughing 01/17/2023   NSTEMI (non-ST elevated myocardial infarction) (HCC) 09/14/2022   Hypokalemia  09/14/2022   Non-STEMI (non-ST elevated myocardial infarction) (HCC) 09/14/2022   GAD (generalized anxiety disorder) 09/10/2022   Primary osteoarthritis of right knee 08/16/2022   Hypervitaminosis 01/03/2022   Memory impairment 01/03/2022   TIA (transient ischemic attack) 12/26/2021   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 10/02/2021   Obesity (BMI 30-39.9) 09/06/2021   Angina pectoris (HCC) 06/23/2021   Hyperlipidemia 04/27/2021   History of non-ST elevation myocardial infarction (NSTEMI) 04/25/2021   Hilar adenopathy 10/13/2018   Mediastinal adenopathy 10/13/2018   Dupuytren's contracture of left hand 08/28/2018   Trigger finger, left index finger 08/28/2018   Non-seasonal allergic rhinitis 08/26/2018   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   Controlled diabetes mellitus type 2 with complications (HCC) 12/27/2017   Class 1 obesity due to excess calories with serious comorbidity in adult 12/27/2017   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   Encounter for long-term (current) use of medications 11/28/2017   Anticoagulant long-term use - on eliquis for PAF 10/21/2017   Sensorineural hearing loss (SNHL) of both ears 09/27/2017   Hypertension goal BP (blood pressure) < 140/90 08/29/2017   CAD (coronary artery disease) 08/29/2017   Gout of ankle 04/08/2017   History of myocardial infarct at age greater than 60 years 01/23/2017  Mixed diabetic hyperlipidemia associated with type 2 diabetes mellitus (HCC) 01/23/2017   Acquired hypothyroidism 01/23/2017   Severe persistent allergic asthma 01/16/2017   Paroxysmal atrial fibrillation (HCC) 01/16/2017   Cardiomegaly 01/16/2017    PCP: Dr Nani Gasser  REFERRING PROVIDER: Dr Nani Gasser  REFERRING DIAG: Chronic Lt shoulder pain   THERAPY DIAG:  Chronic left shoulder pain  Other symptoms and signs involving the musculoskeletal system  Muscle weakness  (generalized)  Abnormal posture  Rationale for Evaluation and Treatment: Rehabilitation  ONSET DATE: 01/31/22   SUBJECTIVE:                                                                                                                                                                                      SUBJECTIVE STATEMENT: Patient reports that her shoulder is feeling much better today. "I think we turned a corner". Using the TENs unit every day which helps.    Eval:Patient reports that she has had Lt shoulder pain for years. She was diagnosed with degenerative changes in the Lt shoulder and rotator cuff changes ~ 8 years ago. Symptoms have increased in the past year.   Hand dominance: Right  PERTINENT HISTORY: HTN; MI x 2 in 2017; Lt shoulder pain; arthritic hip and knee  PAIN:  Are you having pain? Yes: NPRS scale: 1-2/10 Pain location: Lt shoulder  Pain description: tender  Aggravating factors: worse at night; lifting arm or moving the arm; putting on clothes  Relieving factors: tylenol   PRECAUTIONS: None  WEIGHT BEARING RESTRICTIONS: No  FALLS:  Has patient fallen in last 6 months? No  LIVING ENVIRONMENT: Lives with: lives with their family and lives alone Lives in: House/apartment  OCCUPATION: Retired; sedentary   PATIENT GOALS:decrease Lt shoulder pain and gain strength in Lt arm   NEXT MD VISIT: 06/07/23  OBJECTIVE:   PATIENT SURVEYS:  FOTO 50 goal 60  POSTURE: Patient presents with head forward posture with increased thoracic kyphosis; shoulders rounded and elevated; scapulae abducted and rotated along the thoracic spine; head of the humerus anterior in orientation.   UPPER EXTREMITY ROM:   Active ROM Right eval Left eval  Shoulder flexion 139 100  Shoulder extension 67 37  Shoulder abduction 138 70  Shoulder adduction    Shoulder internal rotation Thumb T9 Hand to Lt lateral sacrum  Shoulder external rotation 51 8  Elbow flexion    Elbow  extension    Wrist flexion    Wrist extension    Wrist ulnar deviation    Wrist radial deviation    Wrist pronation    Wrist supination    (Blank rows = not tested)  UPPER  EXTREMITY MMT: Lt UE strength assessed UE at side; unable to assess in MMT positions   MMT Right eval Left eval  Shoulder flexion 4 3-  Shoulder extension 5 4  Shoulder abduction 4 2+  Shoulder adduction    Shoulder internal rotation 5 2+  Shoulder external rotation 4 2+  Middle trapezius 4 3-  Lower trapezius 4 3-  Elbow flexion    Elbow extension    Wrist flexion    Wrist extension    Wrist ulnar deviation    Wrist radial deviation    Wrist pronation    Wrist supination    Grip strength (lbs)    (Blank rows = not tested)  JOINT MOBILITY TESTING:  Decreased mobility Lt shoulder   PALPATION:  Muscular tightness Lt pecs; biceps; upper trap; leveator; periscapular musculature; deltoid    [facility] Adult PT Treatment:                                                DATE: 04/01/23 Therapeutic Exercise: Chin tuck w/ noodle 10 sec x 5 Scap squeeze w/noodle 10 sec x 5  L's w/noodle yellow TB 2-3 sec 10 x 2 W's w/ noodle 3 sec x 10 x 2 (pain and clicking Lt shoulder with yellow TB) Corner stretch lower and mid level 30 sec x 2  Isometric row red TB x 5 sec x 10 x 2 Isometric shoulder ER red TB x 3 sec x 10 x 2 Isometric shoulder IR red TB x 3 sec x 10 x 2 Shoulder rolls backward  Standing wall slide w/ pillowcase x 10 x 2 Standing ball on wall rolling ball up/down; side to side; circles CW/CCW x 10  Supine scap squeeze 5 sec x 10  Sitting and standing with coregeous ball behind back between scapulae  Manual Therapy: Soft tissue mobilization Rt anterior shoulder; upper trap; posterior shoulder - teres  Skilled palpation to assess response to manual work  Neuromuscular re-ed: Postural education and instruction focus on engaging posterior shoulder girdle musculature Modalities: TENS(home)  Cold pack Rt  shoulder x 10 min  Self Care: Modifying recliner to improve posture when sitting  Use of pillows to prop phone, tablet, book when sitting   [facility] Adult PT Treatment:      (DRY NEEDLING)     DATE: 03/25/23 Therapeutic Exercise: Pulley flexion 10 sec x 5 Pulley scaption 10 sec x 5  Pulley shoulder horizontal ab/adduction x 10 Chin tuck w/ noodle 10 sec x 5 Scap squeeze w/noodle 10 sec x 5  L's w/noodle 3 sec x 10 x 2  L's w/noodle yellow TB 2-3 sec 10 x 2 W's w/ noodle 3 sec x 10 x 2  Corner stretch lower and mid level 30 sec x 2  Isometric row red TB x 5 sec x 10  Isometric shoulder ER red TB x 3 sec x 10  Isometric shoulder IR red TB x 3 sec x 10  Shoulder rolls backward  Supine scap squeeze 5 sec x 10  Manual Therapy: Soft tissue mobilization Rt anterior shoulder; upper trap Skilled palpation to assess response to DN and manual work  Psychiatrist  Treatment instructions: Expect mild to moderate muscle soreness. S/S of pneumothorax if dry needled over a lung field, and to seek immediate medical attention should they occur. Patient verbalized understanding of these  instructions and education. Patient Consent Given: Yes Education handout provided: Previously provided Muscles treated: Lt pecs; biceps tendon; upper trap   Electrical stimulation performed: No Parameters: N/A Treatment response/outcome: decreased palpable tightness noted following dry needling and manual work   Neuromuscular re-ed: Postural education and instruction focus on engaging posterior shoulder girdle musculature Modalities: TENS(home)  Moist heat Rt shoulder x 10 min  Self Care: Modifying recliner to improve posture when sitting  Use of pillows to prop phone, tablet, book when sitting   PATIENT EDUCATION: Education details: POC; HEP Person educated: Patient Education method: Explanation, Demonstration, Tactile cues, Verbal cues, and Handouts Education comprehension: verbalized  understanding, returned demonstration, verbal cues required, tactile cues required, and needs further education  HOME EXERCISE PROGRAM: Access Code: [email or web address] URL: [email or web address] Date: 03/28/2023 Prepared by: Corlis Leak  Exercises - Seated Shoulder Flexion AAROM with Pulley Behind  - 2 x daily - 7 x weekly - 1 sets - 10 reps - 10 sec  hold - Seated Shoulder Scaption AAROM with Pulley at Side  - 2 x daily - 7 x weekly - 1 sets - 10 reps - 10sec  hold - Seated Cervical Retraction  - 3 x daily - 7 x weekly - 1 sets - 10 reps - 5-10 hold - Standing Scapular Retraction  - 3 x daily - 7 x weekly - 1 sets - 10 reps - 10 hold - Shoulder External Rotation and Scapular Retraction  - 3 x daily - 7 x weekly - 1 sets - 10 reps -   hold - Supine Scapular Retraction  - 2 x daily - 7 x weekly - 1 sets - 10 reps - 5-10 sec  hold - Shoulder External Rotation and Scapular Retraction with Resistance  - 2 x daily - 7 x weekly - 1 sets - 10 reps - 3-5 sec  hold - Isometric Shoulder Extension at Wall  - 2 x daily - 7 x weekly - 1 sets - 5-10 reps - 5 sec  hold - Isometric Shoulder External Rotation at Wall  - 2 x daily - 7 x weekly - 1 sets - 5 reps - 5 sec  hold - Standing Backward Shoulder Rolls  - 2 x daily - 7 x weekly - 1 sets - 10 reps - 1-2 sec  hold - Corner Pec Major Stretch  - 2 x daily - 7 x weekly - 1 sets - 3 reps - 30 sec  hold - Standing Shoulder Row Reactive Isometric  - 1 x daily - 7 x weekly - 1-2 sets - 10 reps - 5 sec  hold - External Rotation Reactive Isometrics with Flex Bar  - 1 x daily - 7 x weekly - 1-2 sets - 10 reps - 5 sec  hold - Shoulder Internal Rotation Reactive Isometrics  - 1 x daily - 7 x weekly - 1-2 sets - 10 reps - 5 sec  hold - Standing shoulder flexion wall slides  - 2 x daily - 7 x weekly - 1 sets - 5-10 reps - 5 sec  hold  Patient Education - TENS Unit - Trigger Point Dry Needling  ASSESSMENT:  CLINICAL IMPRESSION: Continued improvement in Lt  shoulder pain. Added resistive exercises in weight bearing. Note persistent tightness and stiffness in Lt shoulder with palpable tightness through Rt shoulder girdle. Patient responds well to exercises and DN/manual work. Working on posture and alignment as well as review of exercises. Will progress with posterior shoulder girdle and shoulder  strengthening as tolerated.    OBJECTIVE IMPAIRMENTS: Lt shoulder pain for the past 10 or more years with increased symptoms in the past year. [name] has poor posture and alignment; limited AROM, decreased strength; decreased functional activity and ADL's using Lt UE; pain; difficulty sleeping due to Lt shoulder pain decreased activity tolerance, decreased mobility, decreased ROM, decreased strength, hypomobility, increased fascial restrictions, impaired flexibility, impaired UE functional use, improper body mechanics, postural dysfunction, and pain.    GOALS: Goals reviewed with patient? Yes  SHORT TERM GOALS: Target date: 04/04/2023   Independent in initial HEP  Baseline: Goal status: INITIAL  2.  Improve posture and alignment with patient to demonstrate improved upright posture with posterior shoulder girdle musculature  Baseline:  Goal status: INITIAL   LONG TERM GOALS: Target date: 05/02/2023   Patient reports decreased pain Lt shoulder by 50% allowing her to sleep for longer periods of time(3-4 hours) without awaking due to pain Baseline:  Goal status: INITIAL  2.  Increase strength Lt shoulder to 3/5 to 4/5  Baseline:  Goal status: INITIAL  3.  Increase AROM Lt shoulder by 5-10 degrees  Baseline:  Goal status: INITIAL  4.  Patient demonstrates/reports improved functional use of Lt UE for ADL's including light household chores  Baseline:  Goal status: INITIAL  5.  Independent in HEP (including aquatic program as indicated) Baseline:  Goal status: INITIAL  6.  Improve functional limitation score to 60  Baseline:  Goal status:  INITIAL  PLAN:  PT FREQUENCY: 2x/week  PT DURATION: 8 weeks  PLANNED INTERVENTIONS: Therapeutic exercises, Therapeutic activity, Neuromuscular re-education, Patient/Family education, Self Care, Joint mobilization, Aquatic Therapy, Dry Needling, Electrical stimulation, Cryotherapy, Moist heat, Taping, Vasopneumatic device, Ultrasound, Ionotophoresis 4mg /ml Dexamethasone, Manual therapy, and Re-evaluation  PLAN FOR NEXT SESSION: review and progress with exercises and postural correction; manual work, DN, modalities as indicated    [employer], PT 04/01/2023, 11:51 AM

## 2023-04-04 ENCOUNTER — Ambulatory Visit: Payer: PPO | Attending: Family Medicine | Admitting: Rehabilitative and Restorative Service Providers"

## 2023-04-04 ENCOUNTER — Encounter: Payer: Self-pay | Admitting: Rehabilitative and Restorative Service Providers"

## 2023-04-04 DIAGNOSIS — G8929 Other chronic pain: Secondary | ICD-10-CM

## 2023-04-04 DIAGNOSIS — M25512 Pain in left shoulder: Secondary | ICD-10-CM | POA: Diagnosis not present

## 2023-04-04 DIAGNOSIS — R293 Abnormal posture: Secondary | ICD-10-CM | POA: Diagnosis not present

## 2023-04-04 DIAGNOSIS — M6281 Muscle weakness (generalized): Secondary | ICD-10-CM | POA: Diagnosis not present

## 2023-04-04 DIAGNOSIS — R29898 Other symptoms and signs involving the musculoskeletal system: Secondary | ICD-10-CM | POA: Insufficient documentation

## 2023-04-04 NOTE — Therapy (Signed)
OUTPATIENT PHYSICAL THERAPY SHOULDER TREATMENT   Patient Name: Tara Campbell MRN: [id number] DOB:Mar 09, 1946, 77 y.o., female Today's Date: 04/04/2023  END OF SESSION:  PT End of Session - 04/04/23 1150     Visit Number 9    Number of Visits 16    Date for PT Re-Evaluation 05/02/23    Authorization Type [employer] $10 copay    Progress Note Due on Visit 10    PT Start Time 1148    PT Stop Time 1237    PT Time Calculation (min) 49 min    Activity Tolerance Patient tolerated treatment well             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   Otomycosis of right ear 09/27/2017   Paroxysmal atrial fibrillation (HCC)    Thyroid disease    Trochanteric bursitis of right hip 05/29/2017   Past Surgical History:  Procedure Laterality Date   ANGIOPLASTY     CARDIAC CATHETERIZATION     COLONOSCOPY W/ POLYPECTOMY  01/04/2015   CORONARY STENT INTERVENTION N/A 08/31/2022   Procedure: CORONARY STENT INTERVENTION;  Surgeon: Tonny Bollman, MD;  Location: [facility] INVASIVE CV LAB;  Service: Cardiovascular;  Laterality: N/A;   HEMORRHOID BANDING     LEFT HEART CATH AND CORONARY ANGIOGRAPHY N/A 04/26/2021   Procedure: LEFT HEART CATH AND CORONARY ANGIOGRAPHY;  Surgeon: Tonny Bollman, MD;  Location: [facility] INVASIVE CV LAB;  Service: Cardiovascular;  Laterality: N/A;   LEFT HEART CATH AND CORONARY ANGIOGRAPHY N/A 09/14/2022   Procedure: LEFT HEART CATH AND CORONARY ANGIOGRAPHY;  Surgeon: Tonny Bollman, MD;  Location: [facility] INVASIVE CV LAB;  Service: Cardiovascular;  Laterality: N/A;   Patient Active Problem List   Diagnosis Date Noted   Coughing 01/17/2023   NSTEMI (non-ST elevated  myocardial infarction) (HCC) 09/14/2022   Hypokalemia 09/14/2022   Non-STEMI (non-ST elevated myocardial infarction) (HCC) 09/14/2022   GAD (generalized anxiety disorder) 09/10/2022   Primary osteoarthritis of right knee 08/16/2022   Hypervitaminosis 01/03/2022   Memory impairment 01/03/2022   TIA (transient ischemic attack) 12/26/2021   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 10/02/2021   Obesity (BMI 30-39.9) 09/06/2021   Angina pectoris (HCC) 06/23/2021   Hyperlipidemia 04/27/2021   History of non-ST elevation myocardial infarction (NSTEMI) 04/25/2021   Hilar adenopathy 10/13/2018   Mediastinal adenopathy 10/13/2018   Dupuytren's contracture of left hand 08/28/2018   Trigger finger, left index finger 08/28/2018   Non-seasonal allergic rhinitis 08/26/2018   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   Controlled diabetes mellitus type 2 with complications (HCC) 12/27/2017   Class 1 obesity due to excess calories with serious comorbidity in adult 12/27/2017   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   Encounter for long-term (current) use of medications 11/28/2017   Anticoagulant long-term use - on eliquis for PAF 10/21/2017   Sensorineural hearing loss (SNHL) of both ears 09/27/2017   Hypertension goal BP (blood pressure) < 140/90 08/29/2017   CAD (coronary artery disease) 08/29/2017   Gout of ankle 04/08/2017   History of myocardial infarct  at age greater than 60 years 01/23/2017   Mixed diabetic hyperlipidemia associated with type 2 diabetes mellitus (HCC) 01/23/2017   Acquired hypothyroidism 01/23/2017   Severe persistent allergic asthma 01/16/2017   Paroxysmal atrial fibrillation (HCC) 01/16/2017   Cardiomegaly 01/16/2017    PCP: Dr Nani Gasser  REFERRING PROVIDER: Dr Nani Gasser  REFERRING DIAG: Chronic Lt shoulder pain   THERAPY DIAG:  Chronic left shoulder pain  Other symptoms and signs involving the  musculoskeletal system  Muscle weakness (generalized)  Abnormal posture  Rationale for Evaluation and Treatment: Rehabilitation  ONSET DATE: 01/31/22   SUBJECTIVE:                                                                                                                                                                                      SUBJECTIVE STATEMENT: Patient reports that her shoulder is feeling better. Using the TENs unit every day which helps. Would like to try DN again next visit.    Eval:Patient reports that she has had Lt shoulder pain for years. She was diagnosed with degenerative changes in the Lt shoulder and rotator cuff changes ~ 8 years ago. Symptoms have increased in the past year.   Hand dominance: Right  PERTINENT HISTORY: HTN; MI x 2 in 2017; Lt shoulder pain; arthritic hip and knee  PAIN:  Are you having pain? Yes: NPRS scale: 1-2/10 Pain location: Lt shoulder  Pain description: tender  Aggravating factors: worse at night; lifting arm or moving the arm; putting on clothes  Relieving factors: tylenol   PRECAUTIONS: None  WEIGHT BEARING RESTRICTIONS: No  FALLS:  Has patient fallen in last 6 months? No  LIVING ENVIRONMENT: Lives with: lives with their family and lives alone Lives in: House/apartment  OCCUPATION: Retired; sedentary   PATIENT GOALS:decrease Lt shoulder pain and gain strength in Lt arm   NEXT MD VISIT: 06/07/23  OBJECTIVE:   PATIENT SURVEYS:  FOTO 50 goal 60  POSTURE: Patient presents with head forward posture with increased thoracic kyphosis; shoulders rounded and elevated; scapulae abducted and rotated along the thoracic spine; head of the humerus anterior in orientation.   UPPER EXTREMITY ROM:   Active ROM Right eval Left eval  Shoulder flexion 139 100  Shoulder extension 67 37  Shoulder abduction 138 70  Shoulder adduction    Shoulder internal rotation Thumb T9 Hand to Lt lateral sacrum  Shoulder external  rotation 51 8  Elbow flexion    Elbow extension    Wrist flexion    Wrist extension    Wrist ulnar deviation    Wrist radial deviation    Wrist pronation    Wrist supination    (  Blank rows = not tested)  UPPER EXTREMITY MMT: Lt UE strength assessed UE at side; unable to assess in MMT positions   MMT Right eval Left eval  Shoulder flexion 4 3-  Shoulder extension 5 4  Shoulder abduction 4 2+  Shoulder adduction    Shoulder internal rotation 5 2+  Shoulder external rotation 4 2+  Middle trapezius 4 3-  Lower trapezius 4 3-  Elbow flexion    Elbow extension    Wrist flexion    Wrist extension    Wrist ulnar deviation    Wrist radial deviation    Wrist pronation    Wrist supination    Grip strength (lbs)    (Blank rows = not tested)  JOINT MOBILITY TESTING:  Decreased mobility Lt shoulder   PALPATION:  Muscular tightness Lt pecs; biceps; upper trap; leveator; periscapular musculature; deltoid    [facility] Adult PT Treatment:                                                DATE: 04/04/23 Therapeutic Exercise: Chin tuck w/ noodle 10 sec x 5 Scap squeeze w/noodle 10 sec x 5  L's w/noodle yellow TB 2-3 sec 10 x 2 W's w/ noodle 3 sec x 10 x 2 (pain and clicking Lt shoulder with yellow TB) Corner stretch lower and mid level 30 sec x 2  Isometric row red TB x 5 sec x 10 x 2 Isometric shoulder ER red TB x 3 sec x 10 x 2 Isometric shoulder IR red TB x 3 sec x 10 x 2 Shoulder rolls backward  Standing wall slide w/ pillowcase x 10 x 2 Standing ball on wall rolling ball up/down; side to side; circles CW/CCW x 10  Sitting ball btn hands elbow flexion and extension; shoulder press out and pull in; UE's in some fwd flexion for steering wheel; UE's in fwd flexion for wrist flexion and extension and ulnar/radial deviation Sitting ball at Lt side pressing into ball with Lt hand 5 sec x 10   Supine scap squeeze 5 sec x 10  Sitting and standing with coregeous ball behind back between  scapulae  Manual Therapy: Soft tissue mobilization Rt anterior shoulder; upper trap; posterior shoulder - teres  Skilled palpation to assess response to manual work  Neuromuscular re-ed: Postural education and instruction focus on engaging posterior shoulder girdle musculature Modalities: TENS(home)  Cold pack Rt shoulder x 10 min  Self Care: Modifying recliner to improve posture when sitting  Use of pillows to prop phone, tablet, book when sitting   [facility] Adult PT Treatment:      (DRY NEEDLING)     DATE: 03/25/23 Therapeutic Exercise: Pulley flexion 10 sec x 5 Pulley scaption 10 sec x 5  Pulley shoulder horizontal ab/adduction x 10 Chin tuck w/ noodle 10 sec x 5 Scap squeeze w/noodle 10 sec x 5  L's w/noodle 3 sec x 10 x 2  L's w/noodle yellow TB 2-3 sec 10 x 2 W's w/ noodle 3 sec x 10 x 2  Corner stretch lower and mid level 30 sec x 2  Isometric row red TB x 5 sec x 10  Isometric shoulder ER red TB x 3 sec x 10  Isometric shoulder IR red TB x 3 sec x 10  Shoulder rolls backward  Supine scap squeeze 5 sec x 10  Manual Therapy: Soft tissue mobilization Rt anterior shoulder; upper trap Skilled palpation to assess response to DN and manual work  Psychiatrist  Treatment instructions: Expect mild to moderate muscle soreness. S/S of pneumothorax if dry needled over a lung field, and to seek immediate medical attention should they occur. Patient verbalized understanding of these instructions and education. Patient Consent Given: Yes Education handout provided: Previously provided Muscles treated: Lt pecs; biceps tendon; upper trap   Electrical stimulation performed: No Parameters: N/A Treatment response/outcome: decreased palpable tightness noted following dry needling and manual work   Neuromuscular re-ed: Postural education and instruction focus on engaging posterior shoulder girdle musculature Modalities: TENS(home)  Moist heat Rt shoulder x 10 min  Self  Care: Modifying recliner to improve posture when sitting  Use of pillows to prop phone, tablet, book when sitting   PATIENT EDUCATION: Education details: POC; HEP Person educated: Patient Education method: Explanation, Demonstration, Tactile cues, Verbal cues, and Handouts Education comprehension: verbalized understanding, returned demonstration, verbal cues required, tactile cues required, and needs further education  HOME EXERCISE PROGRAM:  Access Code: URL: [email or web address] Date: 04/04/2023 Prepared by: Corlis Leak  Program Notes Standing - ball on wall - roll up and down; side to side; circles CW/CCWSitting - ball between hands:move ball lap to chest and back; push out from chest and pull back; hands in front steering wheel; hands in front elbows still move wrist up and down, move wrists back and forth; wood chop - move ball from one shoulder to opposite knee the repeat with other side Sitting - ball at left side; left hand on ball: rolling ball forward and back roll ball side to side roll ball in circles CW/CCW press into ball hold 5 sec   Exercises - Supine Quad Set  - 2 x daily - 7 x weekly - 1-2 sets - 10 reps - 5 sec  hold - Small Range Straight Leg Raise  - 2 x daily - 7 x weekly - 1 sets - 10 reps - 5 sec  hold - Supine Knee Extension Strengthening  - 2 x daily - 7 x weekly - 1-2 sets - 10 reps - 5 sec  hold - Supine Heel Slide with Strap  - 2 x daily - 7 x weekly - 1 sets - 5-10 reps - 10 sec  hold - Seated Knee Flexion Slide  - 2 x daily - 7 x weekly - 1 sets - 3 reps - 30 sec  hold - Standing Terminal Knee Extension with Resistance  - 2 x daily - 7 x weekly - 2-3 sets - 10 reps - 3-5 sec  hold - Sidelying Hip Abduction  - 1 x daily - 7 x weekly - 3 sets - 10 reps - 3-5 sec  hold - Beginner Bridge  - 2 x daily - 7 x weekly - 1 sets - 10 reps - 3-5 sec  hold - Supine Hip and Knee Flexion AROM with Swiss Ball  - 2 x daily - 7 x weekly - 1 sets - 3  reps - 30 sec  hold - Sit to Stand  - 2 x daily - 7 x weekly - 1 sets - 10 reps - 3-5 sec  hold - Hip Flexor Stretch at Edge of Bed  - 2 x daily - 7 x weekly - 1 sets - 3 reps - 30 sec  hold - Backwards Walking  - 2 x daily - 7 x weekly - 1 sets -  3 reps - 30 sec  hold Patient Education - TENS Unit - Trigger Point Dry Needling  ASSESSMENT:  CLINICAL IMPRESSION: Continued improvement in Lt shoulder pain. Added strengthening exercises with ball on wall, ball between hands, bal at Lt side. Note persistent tightness and stiffness in Lt shoulder with palpable tightness through Rt shoulder girdle, deep pec muscles, biceps. Patient responds well to exercises and DN/manual work. Working on posture and alignment as well as strengthening exercises. Will progress with posterior shoulder girdle and shoulder strengthening as tolerated.    OBJECTIVE IMPAIRMENTS: Lt shoulder pain for the past 10 or more years with increased symptoms in the past year. [name] has poor posture and alignment; limited AROM, decreased strength; decreased functional activity and ADL's using Lt UE; pain; difficulty sleeping due to Lt shoulder pain decreased activity tolerance, decreased mobility, decreased ROM, decreased strength, hypomobility, increased fascial restrictions, impaired flexibility, impaired UE functional use, improper body mechanics, postural dysfunction, and pain.    GOALS: Goals reviewed with patient? Yes  SHORT TERM GOALS: Target date: 04/04/2023   Independent in initial HEP  Baseline: Goal status: INITIAL  2.  Improve posture and alignment with patient to demonstrate improved upright posture with posterior shoulder girdle musculature  Baseline:  Goal status: INITIAL   LONG TERM GOALS: Target date: 05/02/2023   Patient reports decreased pain Lt shoulder by 50% allowing her to sleep for longer periods of time(3-4 hours) without awaking due to pain Baseline:  Goal status: INITIAL  2.  Increase strength  Lt shoulder to 3/5 to 4/5  Baseline:  Goal status: INITIAL  3.  Increase AROM Lt shoulder by 5-10 degrees  Baseline:  Goal status: INITIAL  4.  Patient demonstrates/reports improved functional use of Lt UE for ADL's including light household chores  Baseline:  Goal status: INITIAL  5.  Independent in HEP (including aquatic program as indicated) Baseline:  Goal status: INITIAL  6.  Improve functional limitation score to 60  Baseline:  Goal status: INITIAL  PLAN:  PT FREQUENCY: 2x/week  PT DURATION: 8 weeks  PLANNED INTERVENTIONS: Therapeutic exercises, Therapeutic activity, Neuromuscular re-education, Patient/Family education, Self Care, Joint mobilization, Aquatic Therapy, Dry Needling, Electrical stimulation, Cryotherapy, Moist heat, Taping, Vasopneumatic device, Ultrasound, Ionotophoresis 4mg /ml Dexamethasone, Manual therapy, and Re-evaluation  PLAN FOR NEXT SESSION: review and progress with exercises and postural correction; manual work, DN, modalities as indicated    [employer], PT 04/04/2023, 11:50 AM

## 2023-04-08 ENCOUNTER — Encounter: Payer: Self-pay | Admitting: Rehabilitative and Restorative Service Providers"

## 2023-04-08 ENCOUNTER — Ambulatory Visit: Payer: PPO | Admitting: Rehabilitative and Restorative Service Providers"

## 2023-04-08 DIAGNOSIS — M25512 Pain in left shoulder: Secondary | ICD-10-CM | POA: Diagnosis not present

## 2023-04-08 DIAGNOSIS — R293 Abnormal posture: Secondary | ICD-10-CM

## 2023-04-08 DIAGNOSIS — R29898 Other symptoms and signs involving the musculoskeletal system: Secondary | ICD-10-CM

## 2023-04-08 DIAGNOSIS — M6281 Muscle weakness (generalized): Secondary | ICD-10-CM

## 2023-04-08 DIAGNOSIS — G8929 Other chronic pain: Secondary | ICD-10-CM

## 2023-04-08 NOTE — Therapy (Signed)
OUTPATIENT PHYSICAL THERAPY SHOULDER TREATMENT Medicare 10th Vist Note   Progress Note Reporting Period 03/07/23 to 04/08/23  See note below for Objective Data and Assessment of Progress/Goals.     Patient Name: Tara Campbell MRN: [id number] DOB:[birth year], 77 y.o., female Today's Date: 04/08/2023  END OF SESSION:  PT End of Session - 04/08/23 0806     Visit Number 10    Number of Visits 16    Date for PT Re-Evaluation 05/02/23    Authorization Type [employer] $10 copay    Progress Note Due on Visit 10    PT Start Time 0759    PT Stop Time 0847    PT Time Calculation (min) 48 min    Activity Tolerance Patient tolerated treatment well             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   Otomycosis of right ear 09/27/2017   Paroxysmal atrial fibrillation (HCC)    Thyroid disease    Trochanteric bursitis of right hip 05/29/2017   Past Surgical History:  Procedure Laterality Date   ANGIOPLASTY     CARDIAC CATHETERIZATION     COLONOSCOPY W/ POLYPECTOMY  01/04/2015   CORONARY STENT INTERVENTION N/A 08/31/2022   Procedure: CORONARY STENT INTERVENTION;  Surgeon: Tonny Bollman, MD;  Location: [facility] INVASIVE CV LAB;  Service: Cardiovascular;  Laterality: N/A;   HEMORRHOID BANDING     LEFT HEART CATH AND CORONARY ANGIOGRAPHY N/A 04/26/2021   Procedure: LEFT HEART CATH AND CORONARY ANGIOGRAPHY;  Surgeon: Tonny Bollman, MD;  Location: [facility] INVASIVE CV LAB;  Service: Cardiovascular;  Laterality: N/A;   LEFT HEART CATH AND CORONARY ANGIOGRAPHY N/A 09/14/2022   Procedure: LEFT HEART CATH AND CORONARY ANGIOGRAPHY;  Surgeon: Tonny Bollman, MD;  Location: [facility] INVASIVE CV  LAB;  Service: Cardiovascular;  Laterality: N/A;   Patient Active Problem List   Diagnosis Date Noted   Coughing 01/17/2023   NSTEMI (non-ST elevated myocardial infarction) (HCC) 09/14/2022   Hypokalemia 09/14/2022   Non-STEMI (non-ST elevated myocardial infarction) (HCC) 09/14/2022   GAD (generalized anxiety disorder) 09/10/2022   Primary osteoarthritis of right knee 08/16/2022   Hypervitaminosis 01/03/2022   Memory impairment 01/03/2022   TIA (transient ischemic attack) 12/26/2021   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 10/02/2021   Obesity (BMI 30-39.9) 09/06/2021   Angina pectoris (HCC) 06/23/2021   Hyperlipidemia 04/27/2021   History of non-ST elevation myocardial infarction (NSTEMI) 04/25/2021   Hilar adenopathy 10/13/2018   Mediastinal adenopathy 10/13/2018   Dupuytren's contracture of left hand 08/28/2018   Trigger finger, left index finger 08/28/2018   Non-seasonal allergic rhinitis 08/26/2018   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   Controlled diabetes mellitus type 2 with complications (HCC) 12/27/2017   Class 1 obesity due to excess calories with serious comorbidity in adult 12/27/2017   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   Encounter for long-term (current) use of medications 11/28/2017   Anticoagulant long-term use - on eliquis for PAF 10/21/2017   Sensorineural hearing loss (SNHL) of both ears 09/27/2017   Hypertension  goal BP (blood pressure) < 140/90 08/29/2017   CAD (coronary artery disease) 08/29/2017   Gout of ankle 04/08/2017   History of myocardial infarct at age greater than 60 years 01/23/2017   Mixed diabetic hyperlipidemia associated with type 2 diabetes mellitus (HCC) 01/23/2017   Acquired hypothyroidism 01/23/2017   Severe persistent allergic asthma 01/16/2017   Paroxysmal atrial fibrillation (HCC) 01/16/2017   Cardiomegaly 01/16/2017    PCP: Dr Nani Gasser  REFERRING PROVIDER: Dr  Nani Gasser  REFERRING DIAG: Chronic Lt shoulder pain   THERAPY DIAG:  Chronic left shoulder pain  Other symptoms and signs involving the musculoskeletal system  Muscle weakness (generalized)  Abnormal posture  Rationale for Evaluation and Treatment: Rehabilitation  ONSET DATE: 01/31/22   SUBJECTIVE:                                                                                                                                                                                      SUBJECTIVE STATEMENT: Patient reports that her shoulder is feeling better. Working on exercises. Using the TENs unit every day which helps.    Eval:Patient reports that she has had Lt shoulder pain for years. She was diagnosed with degenerative changes in the Lt shoulder and rotator cuff changes ~ 8 years ago. Symptoms have increased in the past year.   Hand dominance: Right  PERTINENT HISTORY: HTN; MI x 2 in 2017; Lt shoulder pain; arthritic hip and knee  PAIN:  Are you having pain? Yes: NPRS scale: 1-2/10 Pain location: Lt shoulder  Pain description: tender  Aggravating factors: worse at night; lifting arm or moving the arm; putting on clothes  Relieving factors: tylenol   PRECAUTIONS: None  WEIGHT BEARING RESTRICTIONS: No  FALLS:  Has patient fallen in last 6 months? No  LIVING ENVIRONMENT: Lives with: lives with their family and lives alone Lives in: House/apartment  OCCUPATION: Retired; sedentary   PATIENT GOALS:decrease Lt shoulder pain and gain strength in Lt arm   NEXT MD VISIT: 06/07/23  OBJECTIVE:   PATIENT SURVEYS:  FOTO 50 goal 60  POSTURE: Patient presents with head forward posture with increased thoracic kyphosis; shoulders rounded and elevated; scapulae abducted and rotated along the thoracic spine; head of the humerus anterior in orientation.   UPPER EXTREMITY ROM:   Active ROM Right eval Left eval Left  04/08/23  Shoulder flexion 139 100 108  Shoulder  extension 67 37 46  Shoulder abduction 138 70 84  Shoulder adduction     Shoulder internal rotation Thumb T9 Hand to Lt lateral sacrum Hand to waist   Shoulder external rotation 51 8 20  Elbow flexion     Elbow  extension     Wrist flexion     Wrist extension     Wrist ulnar deviation     Wrist radial deviation     Wrist pronation     Wrist supination     (Blank rows = not tested)  UPPER EXTREMITY MMT: Lt UE strength assessed UE at side; unable to assess in MMT positions   MMT Right eval Left eval  Shoulder flexion 4 3-  Shoulder extension 5 4  Shoulder abduction 4 2+  Shoulder adduction    Shoulder internal rotation 5 2+  Shoulder external rotation 4 2+  Middle trapezius 4 3-  Lower trapezius 4 3-  Elbow flexion    Elbow extension    Wrist flexion    Wrist extension    Wrist ulnar deviation    Wrist radial deviation    Wrist pronation    Wrist supination    Grip strength (lbs)    (Blank rows = not tested)  JOINT MOBILITY TESTING:  Decreased mobility Lt shoulder  04/08/23: improving joint mobility  PALPATION:  Muscular tightness Lt pecs; biceps; upper trap; leveator; periscapular musculature; deltoid    [facility] Adult PT Treatment:          DATE: 04/08/23 Therapeutic Exercise:  Pulley shoulder horizontal ab/adduction x 10 Chin tuck w/ noodle 10 sec x 5 Scap squeeze w/noodle 10 sec x 5  L's w/noodle yellow TB 2-3 sec 10 x 2 W's w/ noodle 3 sec x 10 x 2  Corner stretch lower and mid level 30 sec x 2  Isometric row red TB x 5 sec x 10  Isometric shoulder ER red TB x 3 sec x 10  Isometric shoulder IR red TB x 3 sec x 10  Shoulder rolls backward  Supine scap squeeze 5 sec x 10  Manual Therapy: Soft tissue mobilization Rt anterior shoulder; upper trap Skilled palpation to assess response to DN and manual work  Psychiatrist  Treatment instructions: Expect mild to moderate muscle soreness. S/S of pneumothorax if dry needled over a lung field, and to seek  immediate medical attention should they occur. Patient verbalized understanding of these instructions and education. Patient Consent Given: Yes Education handout provided: Previously provided Muscles treated: Lt pecs; biceps tendon; upper trap   Electrical stimulation performed: No Parameters: N/A Treatment response/outcome: decreased palpable tightness noted following dry needling and manual work   Neuromuscular re-ed: Postural education and instruction focus on engaging posterior shoulder girdle musculature Modalities: TENS(home)  Moist heat Rt shoulder x 10 min  Self Care: Modifying recliner to improve posture when sitting  Use of pillows to prop phone, tablet, book when sitting  [facility] Adult PT Treatment:                                                DATE: 04/04/23 Therapeutic Exercise: Chin tuck w/ noodle 10 sec x 5 Scap squeeze w/noodle 10 sec x 5  L's w/noodle yellow TB 2-3 sec 10 x 2 W's w/ noodle 3 sec x 10 x 2 (pain and clicking Lt shoulder with yellow TB) Corner stretch lower and mid level 30 sec x 2  Isometric row red TB x 5 sec x 10 x 2 Isometric shoulder ER red TB x 3 sec x 10 x 2 Isometric shoulder IR red TB x 3 sec x 10 x  2 Shoulder rolls backward  Standing wall slide w/ pillowcase x 10 x 2 Standing ball on wall rolling ball up/down; side to side; circles CW/CCW x 10  Sitting ball btn hands elbow flexion and extension; shoulder press out and pull in; UE's in some fwd flexion for steering wheel; UE's in fwd flexion for wrist flexion and extension and ulnar/radial deviation Sitting ball at Lt side pressing into ball with Lt hand 5 sec x 10   Supine scap squeeze 5 sec x 10  Sitting and standing with coregeous ball behind back between scapulae  Manual Therapy: Soft tissue mobilization Rt anterior shoulder; upper trap; posterior shoulder - teres  Skilled palpation to assess response to manual work  Neuromuscular re-ed: Postural education and instruction focus on engaging  posterior shoulder girdle musculature Modalities: TENS(home)  Cold pack Rt shoulder x 10 min  Self Care: Modifying recliner to improve posture when sitting  Use of pillows to prop phone, tablet, book when sitting    PATIENT EDUCATION: Education details: POC; HEP Person educated: Patient Education method: Explanation, Demonstration, Tactile cues, Verbal cues, and Handouts Education comprehension: verbalized understanding, returned demonstration, verbal cues required, tactile cues required, and needs further education  HOME EXERCISE PROGRAM:  Access Code: URL: [email or web address] Date: 04/04/2023 Prepared by: Corlis Leak  Program Notes Standing - ball on wall - roll up and down; side to side; circles CW/CCWSitting - ball between hands:move ball lap to chest and back; push out from chest and pull back; hands in front steering wheel; hands in front elbows still move wrist up and down, move wrists back and forth; wood chop - move ball from one shoulder to opposite knee the repeat with other side Sitting - ball at left side; left hand on ball: rolling ball forward and back roll ball side to side roll ball in circles CW/CCW press into ball hold 5 sec   Exercises - Supine Quad Set  - 2 x daily - 7 x weekly - 1-2 sets - 10 reps - 5 sec  hold - Small Range Straight Leg Raise  - 2 x daily - 7 x weekly - 1 sets - 10 reps - 5 sec  hold - Supine Knee Extension Strengthening  - 2 x daily - 7 x weekly - 1-2 sets - 10 reps - 5 sec  hold - Supine Heel Slide with Strap  - 2 x daily - 7 x weekly - 1 sets - 5-10 reps - 10 sec  hold - Seated Knee Flexion Slide  - 2 x daily - 7 x weekly - 1 sets - 3 reps - 30 sec  hold - Standing Terminal Knee Extension with Resistance  - 2 x daily - 7 x weekly - 2-3 sets - 10 reps - 3-5 sec  hold - Sidelying Hip Abduction  - 1 x daily - 7 x weekly - 3 sets - 10 reps - 3-5 sec  hold - Beginner Bridge  - 2 x daily - 7 x weekly - 1 sets - 10 reps -  3-5 sec  hold - Supine Hip and Knee Flexion AROM with Swiss Ball  - 2 x daily - 7 x weekly - 1 sets - 3 reps - 30 sec  hold - Sit to Stand  - 2 x daily - 7 x weekly - 1 sets - 10 reps - 3-5 sec  hold - Hip Flexor Stretch at Edge of Bed  - 2 x daily - 7 x weekly -  1 sets - 3 reps - 30 sec  hold - Backwards Walking  - 2 x daily - 7 x weekly - 1 sets - 3 reps - 30 sec  hold Patient Education - TENS Unit - Trigger Point Dry Needling  ASSESSMENT:  CLINICAL IMPRESSION: Continued improvement in Lt shoulder pain with decreasing pain and increasing AROM. Continued strengthening exercises with ball on wall, ball between hands, bal at Lt side. Note persistent tightness and stiffness in Lt shoulder with palpable tightness through Rt shoulder girdle, deep pec muscles, biceps. Patient responds well to exercises and DN/manual work. Working on posture and alignment as well as strengthening exercises. Will progress with posterior shoulder girdle and shoulder strengthening as tolerated. She is progressing well with goals of therapy and will benefit from continued treatment to achieve maximum rehab potential.    OBJECTIVE IMPAIRMENTS: Lt shoulder pain for the past 10 or more years with increased symptoms in the past year. [name] has poor posture and alignment; limited AROM, decreased strength; decreased functional activity and ADL's using Lt UE; pain; difficulty sleeping due to Lt shoulder pain decreased activity tolerance, decreased mobility, decreased ROM, decreased strength, hypomobility, increased fascial restrictions, impaired flexibility, impaired UE functional use, improper body mechanics, postural dysfunction, and pain.    GOALS: Goals reviewed with patient? Yes  SHORT TERM GOALS: Target date: 04/04/2023   Independent in initial HEP  Baseline: Goal status: met  2.  Improve posture and alignment with patient to demonstrate improved upright posture with posterior shoulder girdle musculature  Baseline:   Goal status: met   LONG TERM GOALS: Target date: 05/02/2023   Patient reports decreased pain Lt shoulder by 50% allowing her to sleep for longer periods of time(3-4 hours) without awaking due to pain Baseline:  Goal status: on going   2.  Increase strength Lt shoulder to 3/5 to 4/5  Baseline:  Goal status: on going   3.  Increase AROM Lt shoulder by 5-10 degrees  Baseline:  Goal status: partially met   4.  Patient demonstrates/reports improved functional use of Lt UE for ADL's including light household chores  Baseline:  Goal status: on going   5.  Independent in HEP (including aquatic program as indicated) Baseline:  Goal status: on going   6.  Improve functional limitation score to 60  Baseline:  Goal status: on going   PLAN:  PT FREQUENCY: 2x/week  PT DURATION: 8 weeks  PLANNED INTERVENTIONS: Therapeutic exercises, Therapeutic activity, Neuromuscular re-education, Patient/Family education, Self Care, Joint mobilization, Aquatic Therapy, Dry Needling, Electrical stimulation, Cryotherapy, Moist heat, Taping, Vasopneumatic device, Ultrasound, Ionotophoresis 4mg /ml Dexamethasone, Manual therapy, and Re-evaluation  PLAN FOR NEXT SESSION: review and progress with exercises and postural correction; manual work, DN, modalities as indicated    [employer], PT 04/08/2023, 8:07 AM

## 2023-04-11 ENCOUNTER — Encounter: Payer: Self-pay | Admitting: Rehabilitative and Restorative Service Providers"

## 2023-04-11 ENCOUNTER — Ambulatory Visit: Payer: PPO | Admitting: Rehabilitative and Restorative Service Providers"

## 2023-04-11 DIAGNOSIS — G8929 Other chronic pain: Secondary | ICD-10-CM

## 2023-04-11 DIAGNOSIS — M6281 Muscle weakness (generalized): Secondary | ICD-10-CM

## 2023-04-11 DIAGNOSIS — R293 Abnormal posture: Secondary | ICD-10-CM

## 2023-04-11 DIAGNOSIS — M25512 Pain in left shoulder: Secondary | ICD-10-CM | POA: Diagnosis not present

## 2023-04-11 DIAGNOSIS — R29898 Other symptoms and signs involving the musculoskeletal system: Secondary | ICD-10-CM

## 2023-04-11 NOTE — Therapy (Signed)
OUTPATIENT PHYSICAL THERAPY SHOULDER TREATMENT Medicare 10th Vist Note   Progress Note Reporting Period 03/07/23 to 04/08/23  See note below for Objective Data and Assessment of Progress/Goals.     Patient Name: Tara Campbell MRN: [id number] DOB:1946/05/27, 77 y.o., female Today's Date: 04/11/2023  END OF SESSION:  PT End of Session - 04/11/23 0848     Visit Number 11    Number of Visits 16    Date for PT Re-Evaluation 05/02/23    Authorization Type [employer] $10 copay    Progress Note Due on Visit 20    PT Start Time 0845    PT Stop Time 0933    PT Time Calculation (min) 48 min    Activity Tolerance Patient tolerated treatment well             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   Otomycosis of right ear 09/27/2017   Paroxysmal atrial fibrillation (HCC)    Thyroid disease    Trochanteric bursitis of right hip 05/29/2017   Past Surgical History:  Procedure Laterality Date   ANGIOPLASTY     CARDIAC CATHETERIZATION     COLONOSCOPY W/ POLYPECTOMY  01/04/2015   CORONARY STENT INTERVENTION N/A 08/31/2022   Procedure: CORONARY STENT INTERVENTION;  Surgeon: Tonny Bollman, MD;  Location: [facility] INVASIVE CV LAB;  Service: Cardiovascular;  Laterality: N/A;   HEMORRHOID BANDING     LEFT HEART CATH AND CORONARY ANGIOGRAPHY N/A 04/26/2021   Procedure: LEFT HEART CATH AND CORONARY ANGIOGRAPHY;  Surgeon: Tonny Bollman, MD;  Location: [facility] INVASIVE CV LAB;  Service: Cardiovascular;  Laterality: N/A;   LEFT HEART CATH AND CORONARY ANGIOGRAPHY N/A 09/14/2022   Procedure: LEFT HEART CATH AND CORONARY ANGIOGRAPHY;  Surgeon: Tonny Bollman, MD;  Location: [facility] INVASIVE CV  LAB;  Service: Cardiovascular;  Laterality: N/A;   Patient Active Problem List   Diagnosis Date Noted   Coughing 01/17/2023   NSTEMI (non-ST elevated myocardial infarction) (HCC) 09/14/2022   Hypokalemia 09/14/2022   Non-STEMI (non-ST elevated myocardial infarction) (HCC) 09/14/2022   GAD (generalized anxiety disorder) 09/10/2022   Primary osteoarthritis of right knee 08/16/2022   Hypervitaminosis 01/03/2022   Memory impairment 01/03/2022   TIA (transient ischemic attack) 12/26/2021   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 10/02/2021   Obesity (BMI 30-39.9) 09/06/2021   Angina pectoris (HCC) 06/23/2021   Hyperlipidemia 04/27/2021   History of non-ST elevation myocardial infarction (NSTEMI) 04/25/2021   Hilar adenopathy 10/13/2018   Mediastinal adenopathy 10/13/2018   Dupuytren's contracture of left hand 08/28/2018   Trigger finger, left index finger 08/28/2018   Non-seasonal allergic rhinitis 08/26/2018   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   Controlled diabetes mellitus type 2 with complications (HCC) 12/27/2017   Class 1 obesity due to excess calories with serious comorbidity in adult 12/27/2017   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   Encounter for long-term (current) use of medications 11/28/2017   Anticoagulant long-term use - on eliquis for PAF 10/21/2017   Sensorineural hearing loss (SNHL) of both ears 09/27/2017   Hypertension  goal BP (blood pressure) < 140/90 08/29/2017   CAD (coronary artery disease) 08/29/2017   Gout of ankle 04/08/2017   History of myocardial infarct at age greater than 60 years 01/23/2017   Mixed diabetic hyperlipidemia associated with type 2 diabetes mellitus (HCC) 01/23/2017   Acquired hypothyroidism 01/23/2017   Severe persistent allergic asthma 01/16/2017   Paroxysmal atrial fibrillation (HCC) 01/16/2017   Cardiomegaly 01/16/2017    PCP: Dr Nani Gasser  REFERRING PROVIDER: Dr  Nani Gasser  REFERRING DIAG: Chronic Lt shoulder pain   THERAPY DIAG:  Chronic left shoulder pain  Other symptoms and signs involving the musculoskeletal system  Muscle weakness (generalized)  Abnormal posture  Rationale for Evaluation and Treatment: Rehabilitation  ONSET DATE: 01/31/22   SUBJECTIVE:                                                                                                                                                                                      SUBJECTIVE STATEMENT: Patient reports that her shoulder is feeling better. She is not having pain or discomfort and is using Lt UE for functional activities at home. Working on exercises. Using the TENs unit every day which helps.    Eval:Patient reports that she has had Lt shoulder pain for years. She was diagnosed with degenerative changes in the Lt shoulder and rotator cuff changes ~ 8 years ago. Symptoms have increased in the past year.   Hand dominance: Right  PERTINENT HISTORY: HTN; MI x 2 in 2017; Lt shoulder pain; arthritic hip and knee  PAIN:  Are you having pain? Yes: NPRS scale: 0/10 Pain location: Lt shoulder  Pain description: tender  Aggravating factors: worse at night; lifting arm or moving the arm; putting on clothes  Relieving factors: tylenol   PRECAUTIONS: None  WEIGHT BEARING RESTRICTIONS: No  FALLS:  Has patient fallen in last 6 months? No  LIVING ENVIRONMENT: Lives with: lives with their family and lives alone Lives in: House/apartment  OCCUPATION: Retired; sedentary   PATIENT GOALS:decrease Lt shoulder pain and gain strength in Lt arm   NEXT MD VISIT: 06/07/23  OBJECTIVE:   PATIENT SURVEYS:  FOTO 50 goal 60  POSTURE: Patient presents with head forward posture with increased thoracic kyphosis; shoulders rounded and elevated; scapulae abducted and rotated along the thoracic spine; head of the humerus anterior in orientation.   UPPER EXTREMITY ROM:    Active ROM Right eval Left eval Left  04/08/23  Shoulder flexion 139 100 108  Shoulder extension 67 37 46  Shoulder abduction 138 70 84  Shoulder adduction     Shoulder internal rotation Thumb T9 Hand to Lt lateral sacrum Hand to  waist   Shoulder external rotation 51 8 20  Elbow flexion     Elbow extension     Wrist flexion     Wrist extension     Wrist ulnar deviation     Wrist radial deviation     Wrist pronation     Wrist supination     (Blank rows = not tested)  UPPER EXTREMITY MMT: Lt UE strength assessed UE at side; unable to assess in MMT positions   MMT Right eval Left eval  Shoulder flexion 4 3-  Shoulder extension 5 4  Shoulder abduction 4 2+  Shoulder adduction    Shoulder internal rotation 5 2+  Shoulder external rotation 4 2+  Middle trapezius 4 3-  Lower trapezius 4 3-  Elbow flexion    Elbow extension    Wrist flexion    Wrist extension    Wrist ulnar deviation    Wrist radial deviation    Wrist pronation    Wrist supination    Grip strength (lbs)    (Blank rows = not tested)  JOINT MOBILITY TESTING:  Decreased mobility Lt shoulder  04/08/23: improving joint mobility  PALPATION:  Muscular tightness Lt pecs; biceps; upper trap; leveator; periscapular musculature; deltoid    [facility] Adult PT Treatment:          DATE: 04/11/23 Therapeutic Exercise:  Pulley shoulder horizontal ab/adduction x 10 Chin tuck w/ noodle 10 sec x 5 Scap squeeze w/noodle 10 sec x 5  L's w/noodle yellow TB 2-3 sec 10 x 2 W's w/ noodle 3 sec x 10 x 2  Corner stretch lower and mid level 30 sec x 2  Isometric row green TB x 5 sec x 10  Isometric shoulder ER green TB x 3 sec x 10  Isometric shoulder IR green TB x 3 sec x 10  Shoulder rolls backward  Ball btn hands for various movements shoulders, elbows, wrists Ball at side in sitting pressing into ball; rolling ball fwd/back; side to side; circles CW/CCW Supine scap squeeze 5 sec x 10  Manual Therapy: Soft tissue  mobilization Rt anterior shoulder; upper trap Skilled palpation to assess response to DN and manual work    Neuromuscular re-ed: Postural education and instruction focus on engaging posterior shoulder girdle musculature Modalities: TENS(home)  Moist heat Rt shoulder x 10 min  Self Care: Modifying recliner to improve posture when sitting  Using of pillows to prop phone, tablet, book when sitting  [facility] Adult PT Treatment:          DATE: 04/08/23 Therapeutic Exercise:  Pulley shoulder horizontal ab/adduction x 10 Chin tuck w/ noodle 10 sec x 5 Scap squeeze w/noodle 10 sec x 5  L's w/noodle yellow TB 2-3 sec 10 x 2 W's w/ noodle 3 sec x 10 x 2  Corner stretch lower and mid level 30 sec x 2  Isometric row red TB x 5 sec x 10  Isometric shoulder ER red TB x 3 sec x 10  Isometric shoulder IR red TB x 3 sec x 10  Shoulder rolls backward  Supine scap squeeze 5 sec x 10  Manual Therapy: Soft tissue mobilization Rt anterior shoulder; upper trap Skilled palpation to assess response to DN and manual work  Psychiatrist  Treatment instructions: Expect mild to moderate muscle soreness. S/S of pneumothorax if dry needled over a lung field, and to seek immediate medical attention should they occur. Patient verbalized understanding of these instructions and education. Patient Consent  Given: Yes Education handout provided: Previously provided Muscles treated: Lt pecs; biceps tendon; upper trap   Electrical stimulation performed: No Parameters: N/A Treatment response/outcome: decreased palpable tightness noted following dry needling and manual work   Neuromuscular re-ed: Postural education and instruction focus on engaging posterior shoulder girdle musculature Modalities: TENS(home)  Moist heat Rt shoulder x 10 min  Self Care: Modifying recliner to improve posture when sitting  Use of pillows to prop phone, tablet, book when sitting   PATIENT EDUCATION: Education details: POC;  HEP Person educated: Patient Education method: Explanation, Demonstration, Tactile cues, Verbal cues, and Handouts Education comprehension: verbalized understanding, returned demonstration, verbal cues required, tactile cues required, and needs further education  HOME EXERCISE PROGRAM:  Access Code: URL: [email or web address] Date: 04/04/2023 Prepared by: Corlis Leak  Program Notes Standing - ball on wall - roll up and down; side to side; circles CW/CCWSitting - ball between hands:move ball lap to chest and back; push out from chest and pull back; hands in front steering wheel; hands in front elbows still move wrist up and down, move wrists back and forth; wood chop - move ball from one shoulder to opposite knee the repeat with other side Sitting - ball at left side; left hand on ball: rolling ball forward and back roll ball side to side roll ball in circles CW/CCW press into ball hold 5 sec   Exercises - Supine Quad Set  - 2 x daily - 7 x weekly - 1-2 sets - 10 reps - 5 sec  hold - Small Range Straight Leg Raise  - 2 x daily - 7 x weekly - 1 sets - 10 reps - 5 sec  hold - Supine Knee Extension Strengthening  - 2 x daily - 7 x weekly - 1-2 sets - 10 reps - 5 sec  hold - Supine Heel Slide with Strap  - 2 x daily - 7 x weekly - 1 sets - 5-10 reps - 10 sec  hold - Seated Knee Flexion Slide  - 2 x daily - 7 x weekly - 1 sets - 3 reps - 30 sec  hold - Standing Terminal Knee Extension with Resistance  - 2 x daily - 7 x weekly - 2-3 sets - 10 reps - 3-5 sec  hold - Sidelying Hip Abduction  - 1 x daily - 7 x weekly - 3 sets - 10 reps - 3-5 sec  hold - Beginner Bridge  - 2 x daily - 7 x weekly - 1 sets - 10 reps - 3-5 sec  hold - Supine Hip and Knee Flexion AROM with Swiss Ball  - 2 x daily - 7 x weekly - 1 sets - 3 reps - 30 sec  hold - Sit to Stand  - 2 x daily - 7 x weekly - 1 sets - 10 reps - 3-5 sec  hold - Hip Flexor Stretch at Edge of Bed  - 2 x daily - 7 x weekly - 1  sets - 3 reps - 30 sec  hold - Backwards Walking  - 2 x daily - 7 x weekly - 1 sets - 3 reps - 30 sec  hold Patient Education - TENS Unit - Trigger Point Dry Needling  ASSESSMENT:  CLINICAL IMPRESSION: Continued improvement in Lt shoulder pain with decreasing pain and increasing AROM and functional activities. Continued strengthening exercises with ball on wall, ball between hands, ball at Lt side. Note persistent but improving tightness and stiffness in Lt shoulder  with palpable tightness through Rt shoulder girdle, deep pec muscles, biceps. Patient responds well to exercises and DN/manual work. Working on posture and alignment as well as strengthening exercises. Will progress with posterior shoulder girdle and shoulder strengthening as tolerated.    OBJECTIVE IMPAIRMENTS: Lt shoulder pain for the past 10 or more years with increased symptoms in the past year. [name] has poor posture and alignment; limited AROM, decreased strength; decreased functional activity and ADL's using Lt UE; pain; difficulty sleeping due to Lt shoulder pain decreased activity tolerance, decreased mobility, decreased ROM, decreased strength, hypomobility, increased fascial restrictions, impaired flexibility, impaired UE functional use, improper body mechanics, postural dysfunction, and pain.    GOALS: Goals reviewed with patient? Yes  SHORT TERM GOALS: Target date: 04/04/2023   Independent in initial HEP  Baseline: Goal status: met  2.  Improve posture and alignment with patient to demonstrate improved upright posture with posterior shoulder girdle musculature  Baseline:  Goal status: met   LONG TERM GOALS: Target date: 05/02/2023   Patient reports decreased pain Lt shoulder by 50% allowing her to sleep for longer periods of time(3-4 hours) without awaking due to pain Baseline:  Goal status: on going   2.  Increase strength Lt shoulder to 3/5 to 4/5  Baseline:  Goal status: on going   3.  Increase AROM  Lt shoulder by 5-10 degrees  Baseline:  Goal status: partially met   4.  Patient demonstrates/reports improved functional use of Lt UE for ADL's including light household chores  Baseline:  Goal status: on going   5.  Independent in HEP (including aquatic program as indicated) Baseline:  Goal status: on going   6.  Improve functional limitation score to 60  Baseline:  Goal status: on going   PLAN:  PT FREQUENCY: 2x/week  PT DURATION: 8 weeks  PLANNED INTERVENTIONS: Therapeutic exercises, Therapeutic activity, Neuromuscular re-education, Patient/Family education, Self Care, Joint mobilization, Aquatic Therapy, Dry Needling, Electrical stimulation, Cryotherapy, Moist heat, Taping, Vasopneumatic device, Ultrasound, Ionotophoresis 4mg /ml Dexamethasone, Manual therapy, and Re-evaluation  PLAN FOR NEXT SESSION: review and progress with exercises and postural correction; manual work, DN, modalities as indicated    [employer], PT 04/11/2023, 8:48 AM

## 2023-04-12 ENCOUNTER — Telehealth: Payer: Self-pay | Admitting: Family Medicine

## 2023-04-12 NOTE — Telephone Encounter (Signed)
Do you mind calling her to get her schedule for Medicare wellness.  Ok for virtual if she prefers. Thank you!!!!

## 2023-04-15 ENCOUNTER — Encounter: Payer: Self-pay | Admitting: Rehabilitative and Restorative Service Providers"

## 2023-04-15 ENCOUNTER — Ambulatory Visit: Payer: PPO | Admitting: Rehabilitative and Restorative Service Providers"

## 2023-04-15 DIAGNOSIS — R293 Abnormal posture: Secondary | ICD-10-CM

## 2023-04-15 DIAGNOSIS — R29898 Other symptoms and signs involving the musculoskeletal system: Secondary | ICD-10-CM

## 2023-04-15 DIAGNOSIS — M25512 Pain in left shoulder: Secondary | ICD-10-CM | POA: Diagnosis not present

## 2023-04-15 DIAGNOSIS — M6281 Muscle weakness (generalized): Secondary | ICD-10-CM

## 2023-04-15 DIAGNOSIS — G8929 Other chronic pain: Secondary | ICD-10-CM

## 2023-04-15 NOTE — Therapy (Signed)
OUTPATIENT PHYSICAL THERAPY SHOULDER TREATMENT Medicare 10th Vist Note   Progress Note Reporting Period 03/07/23 to 04/08/23  See note below for Objective Data and Assessment of Progress/Goals.     Patient Name: Tara Campbell MRN: [id number] DOB:04-19-1946, 77 y.o., female Today's Date: 04/15/2023  END OF SESSION:  PT End of Session - 04/15/23 0858     Visit Number 12    Number of Visits 16    Date for PT Re-Evaluation 05/02/23    Authorization Type [employer] $10 copay    Progress Note Due on Visit 20    PT Start Time 0845    PT Stop Time 0930    PT Time Calculation (min) 45 min    Activity Tolerance Patient tolerated treatment well             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   Otomycosis of right ear 09/27/2017   Paroxysmal atrial fibrillation (HCC)    Thyroid disease    Trochanteric bursitis of right hip 05/29/2017   Past Surgical History:  Procedure Laterality Date   ANGIOPLASTY     CARDIAC CATHETERIZATION     COLONOSCOPY W/ POLYPECTOMY  01/04/2015   CORONARY STENT INTERVENTION N/A 08/31/2022   Procedure: CORONARY STENT INTERVENTION;  Surgeon: Tonny Bollman, MD;  Location: [facility] INVASIVE CV LAB;  Service: Cardiovascular;  Laterality: N/A;   HEMORRHOID BANDING     LEFT HEART CATH AND CORONARY ANGIOGRAPHY N/A 04/26/2021   Procedure: LEFT HEART CATH AND CORONARY ANGIOGRAPHY;  Surgeon: Tonny Bollman, MD;  Location: [facility] INVASIVE CV LAB;  Service: Cardiovascular;  Laterality: N/A;   LEFT HEART CATH AND CORONARY ANGIOGRAPHY N/A 09/14/2022   Procedure: LEFT HEART CATH AND CORONARY ANGIOGRAPHY;  Surgeon: Tonny Bollman, MD;  Location: [facility] INVASIVE CV  LAB;  Service: Cardiovascular;  Laterality: N/A;   Patient Active Problem List   Diagnosis Date Noted   Coughing 01/17/2023   NSTEMI (non-ST elevated myocardial infarction) (HCC) 09/14/2022   Hypokalemia 09/14/2022   Non-STEMI (non-ST elevated myocardial infarction) (HCC) 09/14/2022   GAD (generalized anxiety disorder) 09/10/2022   Primary osteoarthritis of right knee 08/16/2022   Hypervitaminosis 01/03/2022   Memory impairment 01/03/2022   TIA (transient ischemic attack) 12/26/2021   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 10/02/2021   Obesity (BMI 30-39.9) 09/06/2021   Angina pectoris (HCC) 06/23/2021   Hyperlipidemia 04/27/2021   History of non-ST elevation myocardial infarction (NSTEMI) 04/25/2021   Hilar adenopathy 10/13/2018   Mediastinal adenopathy 10/13/2018   Dupuytren's contracture of left hand 08/28/2018   Trigger finger, left index finger 08/28/2018   Non-seasonal allergic rhinitis 08/26/2018   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   Controlled diabetes mellitus type 2 with complications (HCC) 12/27/2017   Class 1 obesity due to excess calories with serious comorbidity in adult 12/27/2017   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   Encounter for long-term (current) use of medications 11/28/2017   Anticoagulant long-term use - on eliquis for PAF 10/21/2017   Sensorineural hearing loss (SNHL) of both ears 09/27/2017   Hypertension  goal BP (blood pressure) < 140/90 08/29/2017   CAD (coronary artery disease) 08/29/2017   Gout of ankle 04/08/2017   History of myocardial infarct at age greater than 60 years 01/23/2017   Mixed diabetic hyperlipidemia associated with type 2 diabetes mellitus (HCC) 01/23/2017   Acquired hypothyroidism 01/23/2017   Severe persistent allergic asthma 01/16/2017   Paroxysmal atrial fibrillation (HCC) 01/16/2017   Cardiomegaly 01/16/2017    PCP: Dr Nani Gasser  REFERRING PROVIDER: Dr  Nani Gasser  REFERRING DIAG: Chronic Lt shoulder pain   THERAPY DIAG:  Chronic left shoulder pain  Other symptoms and signs involving the musculoskeletal system  Muscle weakness (generalized)  Abnormal posture  Rationale for Evaluation and Treatment: Rehabilitation  ONSET DATE: 01/31/22   SUBJECTIVE:                                                                                                                                                                                      SUBJECTIVE STATEMENT: Patient reports that her shoulder is feeling better overall but irritated today from lifting over the weekend getting ready to visit her daughter. She using the TENs unit every day which helps and will take the TENS unit with her when she visits her daughter.    Eval:Patient reports that she has had Lt shoulder pain for years. She was diagnosed with degenerative changes in the Lt shoulder and rotator cuff changes ~ 8 years ago. Symptoms have increased in the past year.   Hand dominance: Right  PERTINENT HISTORY: HTN; MI x 2 in 2017; Lt shoulder pain; arthritic hip and knee  PAIN:  Are you having pain? Yes: NPRS scale: 7/10 Pain location: Lt shoulder  Pain description: tender  Aggravating factors: worse at night; lifting arm or moving the arm; putting on clothes  Relieving factors: tylenol   PRECAUTIONS: None  WEIGHT BEARING RESTRICTIONS: No  FALLS:  Has patient fallen in last 6 months? No  LIVING ENVIRONMENT: Lives with: lives with their family and lives alone Lives in: House/apartment  OCCUPATION: Retired; sedentary   PATIENT GOALS:decrease Lt shoulder pain and gain strength in Lt arm   NEXT MD VISIT: 06/07/23  OBJECTIVE:   PATIENT SURVEYS:  FOTO 50 goal 60  POSTURE: Patient presents with head forward posture with increased thoracic kyphosis; shoulders rounded and elevated; scapulae abducted and rotated along the thoracic spine; head of the humerus anterior  in orientation.   UPPER EXTREMITY ROM:   Active ROM Right eval Left eval Left  04/08/23  Shoulder flexion 139 100 108  Shoulder extension 67 37 46  Shoulder abduction 138 70 84  Shoulder adduction     Shoulder internal rotation  Thumb T9 Hand to Lt lateral sacrum Hand to waist   Shoulder external rotation 51 8 20  Elbow flexion     Elbow extension     Wrist flexion     Wrist extension     Wrist ulnar deviation     Wrist radial deviation     Wrist pronation     Wrist supination     (Blank rows = not tested)  UPPER EXTREMITY MMT: Lt UE strength assessed UE at side; unable to assess in MMT positions   MMT Right eval Left eval  Shoulder flexion 4 3-  Shoulder extension 5 4  Shoulder abduction 4 2+  Shoulder adduction    Shoulder internal rotation 5 2+  Shoulder external rotation 4 2+  Middle trapezius 4 3-  Lower trapezius 4 3-  Elbow flexion    Elbow extension    Wrist flexion    Wrist extension    Wrist ulnar deviation    Wrist radial deviation    Wrist pronation    Wrist supination    Grip strength (lbs)    (Blank rows = not tested)  JOINT MOBILITY TESTING:  Decreased mobility Lt shoulder  04/08/23: improving joint mobility  PALPATION:  Muscular tightness Lt pecs; biceps; upper trap; leveator; periscapular musculature; deltoid   04/15/23: increased palpable tightness throughout Lt shoulder girdle noted   [facility] Adult PT Treatment:          DATE: 04/15/23 Therapeutic Exercise:  Pulley shoulder horizontal ab/adduction x 10 Chin tuck w/ noodle 10 sec x 5 Scap squeeze w/noodle 10 sec x 5  L's w/noodle yellow TB 2-3 sec 10 x 2 W's w/ noodle 3 sec x 10 x 2  Corner stretch lower and mid level 30 sec x 2  Isometric row green TB x 5 sec x 10  Isometric shoulder ER green TB x 3 sec x 10  Isometric shoulder IR green TB x 3 sec x 10  Shoulder rolls backward  Ball btn hands for various movements shoulders, elbows, wrists Ball at side in sitting pressing into ball;  rolling ball fwd/back; side to side; circles CW/CCW Supine scap squeeze 5 sec x 10  Manual Therapy: Soft tissue mobilization Rt anterior shoulder; upper trap Skilled palpation to assess response to DN and manual work  Psychiatrist  Treatment instructions: Expect mild to moderate muscle soreness. S/S of pneumothorax if dry needled over a lung field, and to seek immediate medical attention should they occur. Patient verbalized understanding of these instructions and education.  Patient Consent Given: Yes Education handout provided: Previously provided Muscles treated: Lt pecs; upper trap; biceps Electrical stimulation performed: Yes Parameters: mAmp current; intensity to pt tolerance  Treatment response/outcome: decreased palpable tightness noted    Neuromuscular re-ed: Postural education and instruction focus on engaging posterior shoulder girdle musculature Modalities: TENS(home)  Moist heat Rt shoulder x 10 min  Self Care: Modifying recliner to improve posture when sitting  Using of pillows to prop phone, tablet, book when sitting  [facility] Adult PT Treatment:          DATE: 04/11/23 Therapeutic Exercise:  Pulley shoulder horizontal ab/adduction x 10 Chin tuck w/ noodle 10 sec x 5 Scap squeeze w/noodle 10 sec x 5  L's w/noodle yellow TB 2-3 sec 10 x 2 W's w/ noodle 3 sec x 10 x 2  Corner stretch lower and mid level 30 sec x 2  Isometric row green TB x 5 sec x 10  Isometric shoulder  ER green TB x 3 sec x 10  Isometric shoulder IR green TB x 3 sec x 10  Shoulder rolls backward  Ball btn hands for various movements shoulders, elbows, wrists Ball at side in sitting pressing into ball; rolling ball fwd/back; side to side; circles CW/CCW Supine scap squeeze 5 sec x 10  Manual Therapy: Soft tissue mobilization Rt anterior shoulder; upper trap Skilled palpation to assess response to DN and manual work    Neuromuscular re-ed: Postural education and instruction focus on  engaging posterior shoulder girdle musculature Modalities: TENS(home)  Moist heat Rt shoulder x 10 min  Self Care: Modifying recliner to improve posture when sitting  Using of pillows to prop phone, tablet, book when sitting    PATIENT EDUCATION: Education details: POC; HEP Person educated: Patient Education method: Explanation, Demonstration, Tactile cues, Verbal cues, and Handouts Education comprehension: verbalized understanding, returned demonstration, verbal cues required, tactile cues required, and needs further education  HOME EXERCISE PROGRAM:  Access Code: URL: [email or web address] Date: 04/04/2023 Prepared by: Corlis Leak  Program Notes Standing - ball on wall - roll up and down; side to side; circles CW/CCWSitting - ball between hands:move ball lap to chest and back; push out from chest and pull back; hands in front steering wheel; hands in front elbows still move wrist up and down, move wrists back and forth; wood chop - move ball from one shoulder to opposite knee the repeat with other side Sitting - ball at left side; left hand on ball: rolling ball forward and back roll ball side to side roll ball in circles CW/CCW press into ball hold 5 sec   Exercises - Supine Quad Set  - 2 x daily - 7 x weekly - 1-2 sets - 10 reps - 5 sec  hold - Small Range Straight Leg Raise  - 2 x daily - 7 x weekly - 1 sets - 10 reps - 5 sec  hold - Supine Knee Extension Strengthening  - 2 x daily - 7 x weekly - 1-2 sets - 10 reps - 5 sec  hold - Supine Heel Slide with Strap  - 2 x daily - 7 x weekly - 1 sets - 5-10 reps - 10 sec  hold - Seated Knee Flexion Slide  - 2 x daily - 7 x weekly - 1 sets - 3 reps - 30 sec  hold - Standing Terminal Knee Extension with Resistance  - 2 x daily - 7 x weekly - 2-3 sets - 10 reps - 3-5 sec  hold - Sidelying Hip Abduction  - 1 x daily - 7 x weekly - 3 sets - 10 reps - 3-5 sec  hold - Beginner Bridge  - 2 x daily - 7 x weekly - 1 sets -  10 reps - 3-5 sec  hold - Supine Hip and Knee Flexion AROM with Swiss Ball  - 2 x daily - 7 x weekly - 1 sets - 3 reps - 30 sec  hold - Sit to Stand  - 2 x daily - 7 x weekly - 1 sets - 10 reps - 3-5 sec  hold - Hip Flexor Stretch at Edge of Bed  - 2 x daily - 7 x weekly - 1 sets - 3 reps - 30 sec  hold - Backwards Walking  - 2 x daily - 7 x weekly - 1 sets - 3 reps - 30 sec  hold Patient Education - TENS Unit - Trigger Point Dry Needling  ASSESSMENT:  CLINICAL IMPRESSION: Flare up of Lt shoulder pain related to activities over the weekend involving lifting and use of Lt UE. Tightness and pain responded well to manual work including DN Lt shoulder girdle. HEP will continue with strengthening exercises using TB and with ball on wall, ball between hands, ball at Lt side. Note persistent tightness and stiffness in Lt shoulder with palpable tightness through Rt shoulder girdle, deep pec muscles, biceps. Working on posture and alignment as well as strengthening exercises. Will progress with posterior shoulder girdle and shoulder strengthening as tolerated.    OBJECTIVE IMPAIRMENTS: Lt shoulder pain for the past 10 or more years with increased symptoms in the past year. Tara Campbell has poor posture and alignment; limited AROM, decreased strength; decreased functional activity and ADL's using Lt UE; pain; difficulty sleeping due to Lt shoulder pain decreased activity tolerance, decreased mobility, decreased ROM, decreased strength, hypomobility, increased fascial restrictions, impaired flexibility, impaired UE functional use, improper body mechanics, postural dysfunction, and pain.    GOALS: Goals reviewed with patient? Yes  SHORT TERM GOALS: Target date: 04/04/2023   Independent in initial HEP  Baseline: Goal status: met  2.  Improve posture and alignment with patient to demonstrate improved upright posture with posterior shoulder girdle musculature  Baseline:  Goal status: met   LONG TERM GOALS:  Target date: 05/02/2023   Patient reports decreased pain Lt shoulder by 50% allowing her to sleep for longer periods of time(3-4 hours) without awaking due to pain Baseline:  Goal status: on going   2.  Increase strength Lt shoulder to 3/5 to 4/5  Baseline:  Goal status: on going   3.  Increase AROM Lt shoulder by 5-10 degrees  Baseline:  Goal status: partially met   4.  Patient demonstrates/reports improved functional use of Lt UE for ADL's including light household chores  Baseline:  Goal status: on going   5.  Independent in HEP (including aquatic program as indicated) Baseline:  Goal status: on going   6.  Improve functional limitation score to 60  Baseline:  Goal status: on going   PLAN:  PT FREQUENCY: 2x/week  PT DURATION: 8 weeks  PLANNED INTERVENTIONS: Therapeutic exercises, Therapeutic activity, Neuromuscular re-education, Patient/Family education, Self Care, Joint mobilization, Aquatic Therapy, Dry Needling, Electrical stimulation, Cryotherapy, Moist heat, Taping, Vasopneumatic device, Ultrasound, Ionotophoresis 4mg /ml Dexamethasone, Manual therapy, and Re-evaluation  PLAN FOR NEXT SESSION: review and progress with exercises and postural correction; manual work, DN, modalities as indicated    [employer], PT 04/15/2023, 8:59 AM

## 2023-04-17 ENCOUNTER — Ambulatory Visit: Payer: PPO | Admitting: Rehabilitative and Restorative Service Providers"

## 2023-04-26 ENCOUNTER — Ambulatory Visit: Payer: PPO | Admitting: Pulmonary Disease

## 2023-04-29 NOTE — Progress Notes (Signed)
Primary Care: Dr Linford Arnold  Primary Cardiologist: Dr Jens Som  HF Cardiology: Dr. Shirlee Latch  HPI: [name] is a 77 y.o. with a history of CAD, PAF, Asthma, HTN, DMII. Had previous PCI to RCA.   Admitted 04/2021 with NSTEMI. Had cath that showed severe complex stenosis of her LAD in the proximal mid and distal portion of the vessel.  Patent left circumflex with no significant stenosis, patent left main with no significant stenosis, patent RCA with RCA stents mild nonobstructive disease less than 50% stenosis and normal LVEDP.  Her LAD stenosis was unfavorable for PCI due to heavy calcification and marked tortuosity. There is also noted to be a small caliber vessel with likely poor targets for CABG.  Medical therapy was recommended.  Plavix stopped in 9/22 due to nose bleeds.   She was admitted in 1/23 with ?TIA => neurology evaluated, imaging showed no CVA.  Echo in 1/23 showed EF 60-65%, normal RV, IVC normal.   Patient had markedly positive cardiac PET for anterior ischemia.  She had difficult PCI to proximal LAD in 9/23 with DES and PTCA mid LAD.  There was residual 90% stenosis in the mid/distal LAD that could not be approached due to tortuosity. Patient developed chest pain in 10/23 and went to the ER with NSTEMI, repeat cath was unchanged from 9/23 with patent stent, 50% mLAD stenosis at site of PTCA, and 90% mid-distal LAD (not approachable percutaneously).  She had been off Plavix by accident when this event occurred.  She was treated medically.  She was back in the ER again later in 10/23 with chest pain, troponin was negative.  She was started on ranolazine.   Today she returns for AHF follow up. Overall feeling ***. Denies palpitations, CP, dizziness, edema, or PND/Orthopnea. *** SOB. Appetite ok. No fever or chills. Weight at home *** pounds. Taking all medications. ETOH, smoking ***   Labs (1/23): K 3.9, creatinine 0.9, LDL 66 Labs (10/23): K 4.2, creatinine 1.5, LDL 18 Labs (4/24):  K 4.3, SCr .99, TSH 2.71, LDL 108, Hgb A1c 6.3  ECG (personally reviewed): No new EKG today  PMH: 1. CAD: Remote PCI to RCA.  NSTEMI in 5/22, LHC showed severe complex stenosis proximal/mid/distal LAD with patent RCA stents.  LAD was unfavorable for PCI with marked tortuosity and small caliber, also poor target for CABG due to small size.  Medical management.  - Echo (1/23): EF 60-65%, normal RV, normal IVC.  - LHC (9/23): She had difficult PCI to proximal LAD with DES and PTCA mid LAD.  There was residual 90% stenosis in the mid/distal LAD that could not be approached due to tortuosity. - NSTEMI (10/23): Repeat cath was unchanged from 9/23 with patent stent, 50% mLAD stenosis at site of PTCA, and 90% mid-distal LAD (not approachable percutaneously). 2. Atrial fibrillation: Paroxysmal.  3. ?TIA in 1/23: Imaging negative.  4. HTN 5. Type 2 diabetes 6. Asthma 7. ABIs normal in 12/22 8. Gout 9. Hyperlipidemia  Review of Systems: All systems reviewed and negative except as per HPI.   Current Outpatient Medications  Medication Sig Dispense Refill   acetaminophen (TYLENOL) 650 MG CR tablet Take 1 tablet (650 mg total) by mouth every 8 (eight) hours as needed for pain. 90 tablet 3   albuterol (VENTOLIN HFA) 108 (90 Base) MCG/ACT inhaler Inhale 2 puffs into the lungs every 6 (six) hours as needed for wheezing. 18 g prn   allopurinol (ZYLOPRIM) 300 MG tablet Take 1 tablet (300  mg total) by mouth 2 (two) times daily. 180 tablet 1   apixaban (ELIQUIS) 5 MG TABS tablet Take 1 tablet (5 mg total) by mouth 2 (two) times daily. 180 tablet 3   atenolol (TENORMIN) 100 MG tablet Take 1 tablet (100 mg total) by mouth daily. 90 tablet 1   atorvastatin (LIPITOR) 80 MG tablet Take 1 tablet (80 mg total) by mouth at bedtime. 90 tablet 3   budesonide-formoterol (SYMBICORT) 80-4.5 MCG/ACT inhaler Inhale 2 puffs into the lungs in the morning and at bedtime. 10.2 g 5   clopidogrel (PLAVIX) 75 MG tablet Take 1  tablet (75 mg total) by mouth daily. 90 tablet 3   cyanocobalamin (VITAMIN B12) 1000 MCG tablet Take 1,000 mcg by mouth daily.     empagliflozin (JARDIANCE) 10 MG TABS tablet Take 1 tablet (10 mg total) by mouth daily before breakfast. 30 tablet 11   EPINEPHrine 0.3 mg/0.3 mL IJ SOAJ injection Inject 0.3 mg into the muscle as needed for anaphylaxis. Call 9-1-1 after use. 1 each 2   Evolocumab with Infusor (REPATHA PUSHTRONEX SYSTEM) 420 MG/3.5ML SOCT Inject 420 mg into the skin every 30 (thirty) days. 3.6 mL 11   ezetimibe (ZETIA) 10 MG tablet Take 1 tablet (10 mg total) by mouth daily. 90 tablet 3   fluticasone (FLONASE) 50 MCG/ACT nasal spray Place 1 spray into both nostrils daily. 16 g 3   glucose blood test strip To be used twice daily for testing blood sugars. E11.65 200 each 11   ipratropium (ATROVENT) 0.03 % nasal spray Place 2 sprays into both nostrils every 12 (twelve) hours. 30 mL 0   ipratropium-albuterol (DUONEB) 0.5-2.5 (3) MG/3ML SOLN Take 3 mLs by nebulization every 6 (six) hours as needed. 3 mL PRN   irbesartan (AVAPRO) 300 MG tablet Take 300 mg by mouth daily.     isosorbide mononitrate (IMDUR) 60 MG 24 hr tablet Take 60 mg by mouth 2 (two) times daily.     levothyroxine (SYNTHROID) 125 MCG tablet Take 1 tablet (125 mcg total) by mouth daily before breakfast. 90 tablet 0   Mepolizumab ([name]) 100 MG/ML SOAJ Inject 1 mL (100 mg total) into the skin every 28 (twenty-eight) days. 1 mL 4   metFORMIN (GLUCOPHAGE) 500 MG tablet Take 2 tablets (1,000 mg total) by mouth 2 (two) times daily with a meal. 180 tablet 3   nitroGLYCERIN (NITROSTAT) 0.4 MG SL tablet Place 1 tablet (0.4 mg total) under the tongue every 5 (five) minutes as needed for chest pain (or tightness). 25 tablet PRN   Omega-3 Fatty Acids (FISH OIL) 1000 MG CAPS Take 1 capsule (1,000 mg total) by mouth daily. 90 capsule 3   ondansetron (ZOFRAN-ODT) 8 MG disintegrating tablet 8mg  ODT q8 hours prn nausea 20 tablet 1    Polyvinyl Alcohol-Povidone (REFRESH OP) Place 1 drop into both eyes daily as needed (dry eyes).     ranolazine (RANEXA) 500 MG 12 hr tablet Take 1 tablet (500 mg total) by mouth 2 (two) times daily. 60 tablet 5   Vitamin D, Cholecalciferol, 25 MCG (1000 UT) TABS Take 25 mcg by mouth daily in the afternoon. 60 tablet    zinc gluconate 50 MG tablet Take 50 mg by mouth at bedtime.     No current facility-administered medications for this visit.    Allergies  Allergen Reactions   Sulfamethoxazole Rash    HIVES   Sertraline Other (See Comments)    Nausea/vomiting   Sulfa Antibiotics Rash  Tape Rash    Vinyl;tape      Social History   Socioeconomic History   Marital status: Divorced    Spouse name: Not on file   Number of children: 2   Years of education: 13.5   Highest education level: Some college, no degree  Occupational History    Comment: retired  Tobacco Use   Smoking status: Never   Smokeless tobacco: Never  Vaping Use   Vaping Use: Never used  Substance and Sexual Activity   Alcohol use: Yes    Comment: Rare   Drug use: No   Sexual activity: Not Currently  Other Topics Concern   Not on file  Social History Narrative   Lives alone. Likes to crafts and paint in her free time.      Right Handed    Lives in a one story home    Social Determinants of Health   Financial Resource Strain: Low Risk  (04/06/2021)   Overall Financial Resource Strain (CARDIA)    Difficulty of Paying Living Expenses: Not hard at all  Food Insecurity: No Food Insecurity (03/01/2023)   Hunger Vital Sign    Worried About Running Out of Food in the Last Year: Never true    Ran Out of Food in the Last Year: Never true  Transportation Needs: No Transportation Needs (03/01/2023)   PRAPARE - Administrator, Civil Service (Medical): No    Lack of Transportation (Non-Medical): No  Physical Activity: Insufficiently Active (03/01/2023)   Exercise Vital Sign    Days of Exercise per Week:  2 days    Minutes of Exercise per Session: 20 min  Stress: No Stress Concern Present (03/01/2023)   [employer] of Occupational Health - Occupational Stress Questionnaire    Feeling of Stress : Only a little  Social Connections: Unknown (03/01/2023)   Social Connection and Isolation Panel [NHANES]    Frequency of Communication with Friends and Family: More than three times a week    Frequency of Social Gatherings with Friends and Family: More than three times a week    Attends Religious Services: Patient declined    Database administrator or Organizations: Not on file    Attends Banker Meetings: Not on file    Marital Status: Divorced  Intimate Partner Violence: Not At Risk (04/06/2021)   Humiliation, Afraid, Rape, and Kick questionnaire    Fear of Current or Ex-Partner: No    Emotionally Abused: No    Physically Abused: No    Sexually Abused: No      Family History  Problem Relation Age of Onset   Hypertension Mother    Hyperlipidemia Mother    Alzheimer's disease Mother    Vascular Disease Mother    Glaucoma Father    Heart disease Father    Hypertension Father    Diabetes Father    Hyperlipidemia Father    Skin cancer Father    Diabetes Paternal Grandmother    Colon cancer Neg Hx    Esophageal cancer Neg Hx     There were no vitals filed for this visit.  Wt Readings from Last 3 Encounters:  03/05/23 83.1 kg (183 lb 3.2 oz)  12/04/22 77.6 kg (171 lb)  11/23/22 78.9 kg (174 lb)    PHYSICAL EXAM: General:  *** appearing.  No respiratory difficulty HEENT: normal Neck: supple. JVD *** cm. Carotids 2+ bilat; no bruits. No lymphadenopathy or thyromegaly appreciated. Cor: PMI nondisplaced. Regular rate & rhythm.  No rubs, gallops or murmurs. Lungs: clear Abdomen: soft, nontender, nondistended. No hepatosplenomegaly. No bruits or masses. Good bowel sounds. Extremities: no cyanosis, clubbing, rash, edema  Neuro: alert & oriented x 3, cranial nerves  grossly intact. moves all 4 extremities w/o difficulty. Affect pleasant.   ASSESSMENT & PLAN: 1. CAD: Remote RCA PCI.  NSTEMI in 5/22, LHC showed severe complex stenosis proximal/mid/distal LAD with patent RCA stents.  LAD was unfavorable for PCI with marked tortuosity and small caliber, also poor target for CABG due to small size.  Medical management initially.  Patient had worsening of angina and cardiac PET was done that was high risk.  She then had cath in 9/23 with difficult PCI to proximal LAD with DES and PTCA mid LAD.  There was residual 90% stenosis in the mid/distal LAD that could not be approached due to tortuosity.  She accidentally stopped her Plavix and had NSTEMI in 10/23 with repeat cath unchanged from 9/23 with patent stent, 50% mLAD stenosis at site of PTCA, and 90% mid-distal LAD (not approachable percutaneously). Recently, she has not had chest pain.  - Continue ASA 81 until November then can stop (1 month post-PCI).   - Continue Plavix 75 daily at least 1 year.  - Continue atorvastatin 80 mg daily + Zetia + Repatha, LDL was 18 in 10/23.  - Continue atenolol 100 mg daily.  - Continue Imdur 120 mg daily.  - Continue ranolazine 500 mg bid, QTc stable on ECG today.  - I will arrange for cardiac rehab.  - Restart Jardiance 10 mg daily, BMET today and in 10 days.  2. Atrial fibrillation: Paroxysmal.  She is in NSR.  - Continue Eliquis.   3. HTN: BP controlled.  - She will stay off irbesartan for now with elevated creatinine. BMET today.  4. Diabetes: Continue semaglutide.   She will followup in 3 months with Dr. Thressa Sheller 04/29/2023

## 2023-04-30 ENCOUNTER — Ambulatory Visit: Payer: PPO | Admitting: Pulmonary Disease

## 2023-04-30 ENCOUNTER — Ambulatory Visit (HOSPITAL_COMMUNITY)
Admission: RE | Admit: 2023-04-30 | Discharge: 2023-04-30 | Disposition: A | Discharge status: Home / Self Care | Payer: PPO | Source: Ambulatory Visit | Attending: Internal Medicine | Admitting: Internal Medicine

## 2023-04-30 ENCOUNTER — Telehealth: Payer: Self-pay | Admitting: Pulmonary Disease

## 2023-04-30 ENCOUNTER — Encounter: Payer: Self-pay | Admitting: Pulmonary Disease

## 2023-04-30 ENCOUNTER — Ambulatory Visit: Payer: PPO | Admitting: Rehabilitative and Restorative Service Providers"

## 2023-04-30 ENCOUNTER — Encounter (HOSPITAL_COMMUNITY): Payer: Self-pay

## 2023-04-30 VITALS — BP 142/68 | HR 71 | Wt 184.0 lb

## 2023-04-30 DIAGNOSIS — M109 Gout, unspecified: Secondary | ICD-10-CM | POA: Insufficient documentation

## 2023-04-30 DIAGNOSIS — I10 Essential (primary) hypertension: Secondary | ICD-10-CM | POA: Diagnosis not present

## 2023-04-30 DIAGNOSIS — I2510 Atherosclerotic heart disease of native coronary artery without angina pectoris: Secondary | ICD-10-CM | POA: Insufficient documentation

## 2023-04-30 DIAGNOSIS — Z7951 Long term (current) use of inhaled steroids: Secondary | ICD-10-CM | POA: Insufficient documentation

## 2023-04-30 DIAGNOSIS — Z79899 Other long term (current) drug therapy: Secondary | ICD-10-CM | POA: Diagnosis not present

## 2023-04-30 DIAGNOSIS — Z7984 Long term (current) use of oral hypoglycemic drugs: Secondary | ICD-10-CM | POA: Insufficient documentation

## 2023-04-30 DIAGNOSIS — J45909 Unspecified asthma, uncomplicated: Secondary | ICD-10-CM | POA: Diagnosis not present

## 2023-04-30 DIAGNOSIS — E119 Type 2 diabetes mellitus without complications: Secondary | ICD-10-CM | POA: Insufficient documentation

## 2023-04-30 DIAGNOSIS — E785 Hyperlipidemia, unspecified: Secondary | ICD-10-CM | POA: Insufficient documentation

## 2023-04-30 DIAGNOSIS — J4540 Moderate persistent asthma, uncomplicated: Secondary | ICD-10-CM | POA: Diagnosis not present

## 2023-04-30 DIAGNOSIS — Z955 Presence of coronary angioplasty implant and graft: Secondary | ICD-10-CM | POA: Diagnosis not present

## 2023-04-30 DIAGNOSIS — Z7902 Long term (current) use of antithrombotics/antiplatelets: Secondary | ICD-10-CM | POA: Diagnosis not present

## 2023-04-30 DIAGNOSIS — I25110 Atherosclerotic heart disease of native coronary artery with unstable angina pectoris: Secondary | ICD-10-CM

## 2023-04-30 DIAGNOSIS — E118 Type 2 diabetes mellitus with unspecified complications: Secondary | ICD-10-CM

## 2023-04-30 DIAGNOSIS — I480 Paroxysmal atrial fibrillation: Secondary | ICD-10-CM | POA: Diagnosis not present

## 2023-04-30 DIAGNOSIS — I252 Old myocardial infarction: Secondary | ICD-10-CM | POA: Insufficient documentation

## 2023-04-30 DIAGNOSIS — Z79890 Hormone replacement therapy (postmenopausal): Secondary | ICD-10-CM | POA: Diagnosis not present

## 2023-04-30 DIAGNOSIS — Z7901 Long term (current) use of anticoagulants: Secondary | ICD-10-CM | POA: Diagnosis not present

## 2023-04-30 LAB — BASIC METABOLIC PANEL
Anion gap: 9 (ref 5–15)
BUN: 19 mg/dL (ref 8–23)
CO2: 22 mmol/L (ref 22–32)
Calcium: 10 mg/dL (ref 8.9–10.3)
Chloride: 107 mmol/L (ref 98–111)
Creatinine, Ser: 1.31 mg/dL — ABNORMAL HIGH (ref 0.44–1.00)
GFR, Estimated: 41 mL/min — ABNORMAL LOW (ref >60)
Glucose, Bld: 123 mg/dL — ABNORMAL HIGH (ref 70–99)
Potassium: 4 mmol/L (ref 3.5–5.1)
Sodium: 138 mmol/L (ref 135–145)

## 2023-04-30 MED ORDER — FLUTICASONE FUROATE-VILANTEROL 200-25 MCG/ACT IN AEPB: 1.0000 | INHALATION_SPRAY | Freq: Every day | RESPIRATORY_TRACT | 11 refills | Status: DC

## 2023-04-30 MED ORDER — ALBUTEROL SULFATE HFA 108 (90 BASE) MCG/ACT IN AERS: 2.0000 | INHALATION_SPRAY | Freq: Four times a day (QID) | RESPIRATORY_TRACT | 5 refills | Status: DC | PRN

## 2023-04-30 NOTE — Telephone Encounter (Signed)
(  BREO ELLIPTA) 200-25 MCG/ACT [facility]EP[facility] Pharmacy calling in to get quantity switched from 30 to 60

## 2023-04-30 NOTE — Patient Instructions (Signed)
Glad you are doing well your breathing Will stop the Symbicort as it is not covered by insurance and start you on Breo 200 Will give a prescription for albuterol rescue inhaler Follow-up in 6 months

## 2023-04-30 NOTE — Progress Notes (Addendum)
[place]ara Campbell    [id number]    August 14, 1946  Primary Care Physician:Campbell, Tara Ehlers, MD  Referring Physician: Agapito Games, MD [address] [place],  [place] [address]  Chief complaint:   Follow up for asthma [place] in March 2022  HPI: 77 year old with severe persistent asthma, nasal polyposis, allergies She is on maximal therapy with LABA/LAMA/ICS, Singulair and is getting allergy shots with the allergist at [place] to have significant symptoms with recurrent exacerbations.  She is needed to go on prednisone 4 times the past year. Has chronic dyspnea on exertion, wheeze  Pets: No pets Occupation: Used to work in Production manager Exposures: No mold, hot tub, Financial controller.  No feather pillows or comforters Smoking history: Never smoker Travel history: Originally from [place].  Moved to [place] in 2017 Relevant family history: No significant family history of lung disease  Interim history: Continues on [place].  States that breathing is doing very well On Symbicort twice daily but it is no longer covered by insurance and needs an alternative inhaler.  Underwent right heart cath by Dr. Excell Campbell on October 2023 with findings of patent coronary vessels with recommendation to continue medical therapy.  Cardiology notes reviewed in detail.  Chest x-ray reviewed from February 2024 with no acute cardiopulmonary process   Outpatient Encounter Medications as of 04/30/2023  Medication Sig   acetaminophen (TYLENOL) 650 MG CR tablet Take 1 tablet (650 mg total) by mouth every 8 (eight) hours as needed for pain.   albuterol (VENTOLIN HFA) 108 (90 Base) MCG/ACT inhaler Inhale 2 puffs into the lungs every 6 (six) hours as needed for wheezing.   allopurinol (ZYLOPRIM) 300 MG tablet Take 1 tablet (300 mg total) by mouth 2 (two) times daily. (Patient taking differently: Take 300 mg by mouth daily.)   apixaban (ELIQUIS) 5 MG TABS tablet  Take 1 tablet (5 mg total) by mouth 2 (two) times daily.   atenolol (TENORMIN) 100 MG tablet Take 1 tablet (100 mg total) by mouth daily.   atorvastatin (LIPITOR) 80 MG tablet Take 1 tablet (80 mg total) by mouth at bedtime.   budesonide-formoterol (SYMBICORT) 80-4.5 MCG/ACT inhaler Inhale 2 puffs into the lungs in the morning and at bedtime.   clopidogrel (PLAVIX) 75 MG tablet Take 1 tablet (75 mg total) by mouth daily.   cyanocobalamin (VITAMIN B12) 1000 MCG tablet Take 1,000 mcg by mouth daily.   empagliflozin (JARDIANCE) 10 MG TABS tablet Take 1 tablet (10 mg total) by mouth daily before breakfast.   EPINEPHrine 0.3 mg/0.3 mL IJ SOAJ injection Inject 0.3 mg into the muscle as needed for anaphylaxis. Call 9-1-1 after use.   Evolocumab with Infusor (REPATHA PUSHTRONEX SYSTEM) 420 MG/3.5ML SOCT Inject 420 mg into the skin every 30 (thirty) days.   ezetimibe (ZETIA) 10 MG tablet Take 1 tablet (10 mg total) by mouth daily.   fluticasone (FLONASE) 50 MCG/ACT nasal spray Place 1 spray into both nostrils daily.   glucose blood test strip To be used twice daily for testing blood sugars. E11.65   ipratropium (ATROVENT) 0.03 % nasal spray Place 2 sprays into both nostrils every 12 (twelve) hours.   ipratropium-albuterol (DUONEB) 0.5-2.5 (3) MG/3ML SOLN Take 3 mLs by nebulization every 6 (six) hours as needed.   irbesartan (AVAPRO) 300 MG tablet Take 300 mg by mouth daily.   isosorbide mononitrate (IMDUR) 60 MG 24 hr tablet Patient takes 0.5 tablet in the morning  by mouth and 1 tablet in the evening.   levothyroxine (SYNTHROID) 125 MCG tablet Take 1 tablet (125 mcg total) by mouth daily before breakfast.   Mepolizumab ([name]) 100 MG/ML SOAJ Inject 1 mL (100 mg total) into the skin every 28 (twenty-eight) days.   metFORMIN (GLUCOPHAGE) 500 MG tablet Take 2 tablets (1,000 mg total) by mouth 2 (two) times daily with a meal.   nitroGLYCERIN (NITROSTAT) 0.4 MG SL tablet Place 1 tablet (0.4 mg total) under the  tongue every 5 (five) minutes as needed for chest pain (or tightness).   Omega-3 Fatty Acids (FISH OIL) 1000 MG CAPS Take 1 capsule (1,000 mg total) by mouth daily.   ondansetron (ZOFRAN-ODT) 8 MG disintegrating tablet 8mg  ODT q8 hours prn nausea   Polyvinyl Alcohol-Povidone (REFRESH OP) Place 1 drop into both eyes daily as needed (dry eyes).   ranolazine (RANEXA) 500 MG 12 hr tablet Take 1 tablet (500 mg total) by mouth 2 (two) times daily.   Vitamin D, Cholecalciferol, 25 MCG (1000 UT) TABS Take 25 mcg by mouth daily in the afternoon.   zinc gluconate 50 MG tablet Take 50 mg by mouth at bedtime.   No facility-administered encounter medications on file as of 04/30/2023.    Physical Exam: Blood pressure 130/72, pulse 80, temperature 98.2 F (36.8 C), temperature source Oral, height 5\' 2"  (1.575 m), weight 185 lb 12.8 oz (84.3 kg), SpO2 97 %. Gen:      No acute distress HEENT:  EOMI, sclera anicteric Neck:     No masses; no thyromegaly Lungs:    Clear to auscultation bilaterally; normal respiratory effort CV:         Regular rate and rhythm; no murmurs Abd:      + bowel sounds; soft, non-tender; no palpable masses, no distension Ext:    No edema; adequate peripheral perfusion Skin:      Warm and dry; no rash Neuro: alert and oriented x 3 Psych: normal mood and affect   Data Reviewed: Imaging: Chest x-ray 08/16/2020-no acute cardiopulmonary disease.  Chest x-ray 04/25/2021-no active cardiopulmonary disease Chest x-ray 12/26/2021-no acute cardiopulmonary disease Chest x-ray 01/21/2023-no acute cardiopulmonary disease. I have reviewed the images personally  PFTs: 11/17/2020 FVC 1.74 [62%], FEV1 1.17 (56%], F/F 67 No significant obstruction, restriction suggested by reduced lung volumes.  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  She has good response to biologic therapy Change Symbicort to [facility] due to insurance requirements.  Plan/Recommendations: Change Symbicort to [employer].  Continue [name]. Follow-up in 6 months  Chilton Greathouse MD [facility] 04/30/2023, 3:28 PM  CC: Tara Games, *

## 2023-04-30 NOTE — Patient Instructions (Signed)
Thank you for coming in today  If you had labs drawn today, any labs that are abnormal the clinic will call you No news is good news  Medications: No changes   Follow up appointments:  Your physician recommends that you schedule a follow-up appointment in:  3-4 months with Dr. Shirlee Latch    Do the following things EVERYDAY: Weigh yourself in the morning before breakfast. Write it down and keep it in a log. Take your medicines as prescribed Eat low salt foods--Limit salt (sodium) to 2000 mg per day.  Stay as active as you can everyday Limit all fluids for the day to less than 2 liters   At the [facility], you and your health needs are our priority. As part of our continuing mission to provide you with exceptional heart care, we have created designated Provider Care Teams. These Care Teams include your primary Cardiologist (physician) and Advanced Practice Providers (APPs- Physician Assistants and Nurse Practitioners) who all work together to provide you with the care you need, when you need it.   You may see any of the following providers on your designated Care Team at your next follow up: Dr Arvilla Meres Dr Marca Ancona Dr. Marcos Eke, NP Robbie Lis, [place] [facility] [place], [place] Brynda Peon, NP Karle Plumber, PharmD   Please be sure to bring in all your medications bottles to every appointment.    Thank you for choosing [facility]  If you have any questions or concerns before your next appointment please send [place] a message through [place] or call our office at [phone].    TO LEAVE A MESSAGE FOR THE NURSE SELECT OPTION 2, PLEASE LEAVE A MESSAGE INCLUDING: YOUR NAME DATE OF BIRTH CALL BACK NUMBER REASON FOR CALL**this is important as we prioritize the call backs  YOU WILL RECEIVE A CALL BACK THE SAME DAY AS LONG AS YOU CALL BEFORE 4:00 PM

## 2023-05-01 ENCOUNTER — Other Ambulatory Visit: Payer: Self-pay | Admitting: Pulmonary Disease

## 2023-05-01 NOTE — Telephone Encounter (Signed)
Refill request received.  [facility] prescription sent to pharmacy #[id number] with 5 refills earlier today. Nothing further at this time.

## 2023-05-02 NOTE — Telephone Encounter (Signed)
Hey!  This patient has been scheduled for their tele AWV for this year already. [place] scheduled it on 03/14/2023.

## 2023-05-06 ENCOUNTER — Ambulatory Visit (INDEPENDENT_AMBULATORY_CARE_PROVIDER_SITE_OTHER): Payer: PPO | Admitting: Family Medicine

## 2023-05-06 DIAGNOSIS — Z0000 Encounter for general adult medical examination without abnormal findings: Secondary | ICD-10-CM | POA: Diagnosis not present

## 2023-05-06 DIAGNOSIS — Z1211 Encounter for screening for malignant neoplasm of colon: Secondary | ICD-10-CM

## 2023-05-06 NOTE — Patient Instructions (Signed)
MEDICARE ANNUAL WELLNESS VISIT Health Maintenance Summary and Written Plan of Care  Tara Campbell ,  Thank you for allowing me to perform your Medicare Annual Wellness Visit and for your ongoing commitment to your health.   Health Maintenance & Immunization History Health Maintenance  Topic Date Due   COVID-19 Vaccine (7 - 2023-24 season) 05/22/2023 (Originally 10/27/2022)   Colonoscopy  09/11/2023 (Originally 02/01/2020)   FOOT EXAM  06/01/2023   INFLUENZA VACCINE  07/04/2023   OPHTHALMOLOGY EXAM  08/24/2023   HEMOGLOBIN A1C  09/04/2023   Diabetic kidney evaluation - Urine ACR  10/19/2023   Diabetic kidney evaluation - eGFR measurement  04/29/2024   Medicare Annual Wellness (AWV)  05/05/2024   MAMMOGRAM  10/17/2024   DTaP/Tdap/Td (2 - Td or Tdap) 05/30/2027   Pneumonia Vaccine 75+ Years old  Completed   DEXA SCAN  Completed   Hepatitis C Screening  Completed   Zoster Vaccines- Shingrix  Completed   HPV VACCINES  Aged Out   Immunization History  Administered Date(s) Administered   COVID-19, mRNA, vaccine(Comirnaty)12 years and older 09/01/2022   Fluad Quad(high Dose 65+) 09/22/2019, 09/06/2021   Influenza, High Dose Seasonal PF 08/29/2017, 09/07/2018, 08/13/2020   Influenza-Unspecified 08/20/2022   Moderna SARS-COV2 Booster Vaccination 10/03/2021   PFIZER(Purple Top)SARS-COV-2 Vaccination 12/13/2019, 01/04/2020, 09/03/2020, 03/24/2021, 04/18/2022   Pfizer Covid-19 Vaccine Bivalent Booster 80yrs & up 10/17/2021   Pneumococcal Conjugate-13 11/16/2019   Pneumococcal Polysaccharide-23 08/29/2017   Tdap 05/29/2017   Zoster Recombinat (Shingrix) 02/28/2018, 06/12/2018    These are the patient goals that we discussed:  Goals Addressed               This Visit's Progress     Patient Stated (pt-stated)        Patient stated that she would like to build her shoulder strength to avoid surgery.          This is a list of Health Maintenance Items that are overdue or due  now: Colorectal cancer screening    Orders/Referrals Placed Today: Orders Placed This Encounter  Procedures   Ambulatory referral to Gastroenterology (for Colonoscopy)    Referral Priority:   Routine    Referral Type:   Consultation    Referral Reason:   Specialty Services Required    Number of Visits Requested:   1    (Contact our referral department at [phone] if you have not spoken with someone about your referral appointment within the next 5 days)    Follow-up Plan Follow-up with Agapito Games, MD as planned Medicare wellness in one year. Patient will access AVS on my chart.      Health Maintenance, Female Adopting a healthy lifestyle and getting preventive care are important in promoting health and wellness. Ask your health care provider about: The right schedule for you to have regular tests and exams. Things you can do on your own to prevent diseases and keep yourself healthy. What should I know about diet, weight, and exercise? Eat a healthy diet  Eat a diet that includes plenty of vegetables, fruits, low-fat dairy products, and lean protein. Do not eat a lot of foods that are high in solid fats, added sugars, or sodium. Maintain a healthy weight Body mass index (BMI) is used to identify weight problems. It estimates body fat based on height and weight. Your health care provider can help determine your BMI and help you achieve or maintain a healthy weight. Get regular exercise Get regular exercise. This is one  of the most important things you can do for your health. Most adults should: Exercise for at least 150 minutes each week. The exercise should increase your heart rate and make you sweat (moderate-intensity exercise). Do strengthening exercises at least twice a week. This is in addition to the moderate-intensity exercise. Spend less time sitting. Even light physical activity can be beneficial. Watch cholesterol and blood lipids Have your blood  tested for lipids and cholesterol at 79 years of age, then have this test every 5 years. Have your cholesterol levels checked more often if: Your lipid or cholesterol levels are high. You are older than 79 years of age. You are at high risk for heart disease. What should I know about cancer screening? Depending on your health history and family history, you may need to have cancer screening at various ages. This may include screening for: Breast cancer. Cervical cancer. Colorectal cancer. Skin cancer. Lung cancer. What should I know about heart disease, diabetes, and high blood pressure? Blood pressure and heart disease High blood pressure causes heart disease and increases the risk of stroke. This is more likely to develop in people who have high blood pressure readings or are overweight. Have your blood pressure checked: Every 3-5 years if you are 47-100 years of age. Every year if you are 41 years old or older. Diabetes Have regular diabetes screenings. This checks your fasting blood sugar level. Have the screening done: Once every three years after age 16 if you are at a normal weight and have a low risk for diabetes. More often and at a younger age if you are overweight or have a high risk for diabetes. What should I know about preventing infection? Hepatitis B If you have a higher risk for hepatitis B, you should be screened for this virus. Talk with your health care provider to find out if you are at risk for hepatitis B infection. Hepatitis C Testing is recommended for: Everyone born from [birth year] through 1967. Anyone with known risk factors for hepatitis C. Sexually transmitted infections (STIs) Get screened for STIs, including gonorrhea and chlamydia, if: You are sexually active and are younger than 79 years of age. You are older than 79 years of age and your health care provider tells you that you are at risk for this type of infection. Your sexual activity has changed since  you were last screened, and you are at increased risk for chlamydia or gonorrhea. Ask your health care provider if you are at risk. Ask your health care provider about whether you are at high risk for HIV. Your health care provider may recommend a prescription medicine to help prevent HIV infection. If you choose to take medicine to prevent HIV, you should first get tested for HIV. You should then be tested every 3 months for as long as you are taking the medicine. Pregnancy If you are about to stop having your period (premenopausal) and you may become pregnant, seek counseling before you get pregnant. Take 400 to 800 micrograms (mcg) of folic acid every day if you become pregnant. Ask for birth control (contraception) if you want to prevent pregnancy. Osteoporosis and menopause Osteoporosis is a disease in which the bones lose minerals and strength with aging. This can result in bone fractures. If you are 64 years old or older, or if you are at risk for osteoporosis and fractures, ask your health care provider if you should: Be screened for bone loss. Take a calcium or vitamin D supplement to  lower your risk of fractures. Be given hormone replacement therapy (HRT) to treat symptoms of menopause. Follow these instructions at home: Alcohol use Do not drink alcohol if: Your health care provider tells you not to drink. You are pregnant, may be pregnant, or are planning to become pregnant. If you drink alcohol: Limit how much you have to: 0-1 drink a day. Know how much alcohol is in your drink. In the [place] one drink equals one 12 oz bottle of beer (355 mL), one 5 oz glass of wine (148 mL), or one 1 oz glass of hard liquor (44 mL). Lifestyle Do not use any products that contain nicotine or tobacco. These products include cigarettes, chewing tobacco, and vaping devices, such as e-cigarettes. If you need help quitting, ask your health care provider. Do not use street drugs. Do not share  needles. Ask your health care provider for help if you need support or information about quitting drugs. General instructions Schedule regular health, dental, and eye exams. Stay current with your vaccines. Tell your health care provider if: You often feel depressed. You have ever been abused or do not feel safe at home. Summary Adopting a healthy lifestyle and getting preventive care are important in promoting health and wellness. Follow your health care provider's instructions about healthy diet, exercising, and getting tested or screened for diseases. Follow your health care provider's instructions on monitoring your cholesterol and blood pressure. This information is not intended to replace advice given to you by your health care provider. Make sure you discuss any questions you have with your health care provider. Document Revised: 04/10/2021 Document Reviewed: 04/10/2021 Elsevier Patient Education  2024 [employer].

## 2023-05-06 NOTE — Progress Notes (Signed)
MEDICARE ANNUAL WELLNESS VISIT  05/06/2023  Telephone Visit Disclaimer This Medicare AWV was conducted by telephone due to national recommendations for restrictions regarding the COVID-19 Pandemic (e.g. social distancing).  I verified, using two identifiers, that I am speaking with Tara Campbell or their authorized healthcare agent. I discussed the limitations, risks, security, and privacy concerns of performing an evaluation and management service by telephone and the potential availability of an in-person appointment in the future. The patient expressed understanding and agreed to proceed.  Location of Patient: Home Location of Provider (nurse):  In the office.  Subjective:    Tara Campbell is a 77 y.o. female patient of Tara, Barbarann Ehlers, MD who had a Medicare Annual Wellness Visit today via telephone. Tara Campbell is Retired and lives alone. she has 2 children. she reports that she is socially active and does interact with friends/family regularly. she is moderately physically active and enjoys crafts and painting.  Patient Care Team: Agapito Games, MD as PCP - General (Family Medicine) Laurey Morale, MD as PCP - Advanced Heart Failure (Cardiology) Lupita Leash, MD as Consulting Physician (Pulmonary Disease) Lewayne Bunting, MD as Consulting Physician (Cardiology) Gabriel Carina, [facility] as Pharmacist (Pharmacist) Glendale Chard, DO as Consulting Physician (Neurology) [facility], Dory Peru, MD as Rounding Team (Cardiology)     05/06/2023   10:53 AM 03/07/2023    2:47 PM 09/22/2022    9:16 PM 09/19/2022    5:16 PM 09/13/2022    8:53 PM 09/08/2022   10:24 PM 08/31/2022    8:05 AM  Advanced Directives  Does Patient Have a Medical Advance Directive? Yes Yes No Yes Yes Yes Yes  Type of Advance Directive Living will Healthcare Power of [place];Living will Living will Living will Living will Living will Living will  Does patient want to make changes to medical  advance directive? No - Patient declined  Yes (ED - Information included in AVS)  Yes (ED - send information to MyChart) Yes (ED - Information included in AVS)   Copy of Healthcare Power of Attorney in Chart?  No - copy requested       Would patient like information on creating a medical advance directive?   No - Patient declined No - Patient declined Yes (ED - send information to MyChart);Yes (ED - Information included in AVS)      Hospital Utilization Over the Past 12 Months: # of hospitalizations or ER visits: 6 # of surgeries: 2  Review of Systems    Patient reports that her overall health is better compared to last year.  History obtained from chart review and the patient  Patient Reported Readings (BP, Pulse, CBG, Weight, etc) none  Pain Assessment Pain : No/denies pain     Current Medications & Allergies (verified) Allergies as of 05/06/2023       Reactions   Sulfamethoxazole Rash   HIVES   Sertraline Other (See Comments)   Nausea/vomiting   Sulfa Antibiotics Rash   Tape Rash   Vinyl;tape        Medication List        Accurate as of May 06, 2023 11:03 AM. If you have any questions, ask your nurse or doctor.          acetaminophen 650 MG CR tablet Commonly known as: TYLENOL Take 1 tablet (650 mg total) by mouth every 8 (eight) hours as needed for pain.   albuterol 108 (90 Base) MCG/ACT inhaler Commonly known as: VENTOLIN  HFA Inhale 2 puffs into the lungs every 6 (six) hours as needed for wheezing.   allopurinol 300 MG tablet Commonly known as: ZYLOPRIM Take 1 tablet (300 mg total) by mouth 2 (two) times daily. What changed: when to take this   apixaban 5 MG Tabs tablet Commonly known as: Eliquis Take 1 tablet (5 mg total) by mouth 2 (two) times daily.   atenolol 100 MG tablet Commonly known as: TENORMIN Take 1 tablet (100 mg total) by mouth daily.   atorvastatin 80 MG tablet Commonly known as: LIPITOR Take 1 tablet (80 mg total) by mouth at  bedtime.   clopidogrel 75 MG tablet Commonly known as: PLAVIX Take 1 tablet (75 mg total) by mouth daily.   cyanocobalamin 1000 MCG tablet Commonly known as: VITAMIN B12 Take 1,000 mcg by mouth daily.   empagliflozin 10 MG Tabs tablet Commonly known as: Jardiance Take 1 tablet (10 mg total) by mouth daily before breakfast.   EPINEPHrine 0.3 mg/0.3 mL Soaj injection Commonly known as: EPI-PEN Inject 0.3 mg into the muscle as needed for anaphylaxis. Call 9-1-1 after use.   ezetimibe 10 MG tablet Commonly known as: ZETIA Take 1 tablet (10 mg total) by mouth daily.   Fish Oil 1000 MG Caps Take 1 capsule (1,000 mg total) by mouth daily.   fluticasone 50 MCG/ACT nasal spray Commonly known as: FLONASE Place 1 spray into both nostrils daily.   fluticasone furoate-vilanterol 200-25 MCG/ACT Aepb Commonly known as: BREO ELLIPTA INHALE 1 PUFF INTO THE lungs DAILY   glucose blood test strip To be used twice daily for testing blood sugars. E11.65   ipratropium 0.03 % nasal spray Commonly known as: ATROVENT Place 2 sprays into both nostrils every 12 (twelve) hours.   ipratropium-albuterol 0.5-2.5 (3) MG/3ML Soln Commonly known as: DUONEB Take 3 mLs by nebulization every 6 (six) hours as needed.   irbesartan 300 MG tablet Commonly known as: AVAPRO Take 300 mg by mouth daily.   isosorbide mononitrate 60 MG 24 hr tablet Commonly known as: IMDUR Patient takes 0.5 tablet in the morning by mouth and 1 tablet in the evening.   levothyroxine 125 MCG tablet Commonly known as: SYNTHROID Take 1 tablet (125 mcg total) by mouth daily before breakfast.   metFORMIN 500 MG tablet Commonly known as: GLUCOPHAGE Take 2 tablets (1,000 mg total) by mouth 2 (two) times daily with a meal.   nitroGLYCERIN 0.4 MG SL tablet Commonly known as: NITROSTAT Place 1 tablet (0.4 mg total) under the tongue every 5 (five) minutes as needed for chest pain (or tightness).   [name] 100 MG/ML Soaj Generic  drug: Mepolizumab Inject 1 mL (100 mg total) into the skin every 28 (twenty-eight) days.   ondansetron 8 MG disintegrating tablet Commonly known as: ZOFRAN-ODT 8mg  ODT q8 hours prn nausea   ranolazine 500 MG 12 hr tablet Commonly known as: RANEXA Take 1 tablet (500 mg total) by mouth 2 (two) times daily.   REFRESH OP Place 1 drop into both eyes daily as needed (dry eyes).   Repatha Pushtronex System 420 MG/3.5ML Soct Generic drug: Evolocumab with Infusor Inject 420 mg into the skin every 30 (thirty) days.   Vitamin D (Cholecalciferol) 25 MCG (1000 UT) Tabs Take 25 mcg by mouth daily in the afternoon.   zinc gluconate 50 MG tablet Take 50 mg by mouth at bedtime.        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   Otomycosis of right ear 09/27/2017   Paroxysmal atrial fibrillation (HCC)    Thyroid disease    Trochanteric bursitis of right hip 05/29/2017   Past Surgical History:  Procedure Laterality Date   ANGIOPLASTY     CARDIAC CATHETERIZATION     COLONOSCOPY W/ POLYPECTOMY  01/04/2015   CORONARY STENT INTERVENTION N/A 08/31/2022   Procedure: CORONARY STENT INTERVENTION;  Surgeon: Tonny Bollman, MD;  Location: [facility] INVASIVE CV LAB;  Service: Cardiovascular;  Laterality: N/A;   HEMORRHOID BANDING     LEFT HEART CATH AND CORONARY ANGIOGRAPHY N/A 04/26/2021   Procedure: LEFT HEART CATH AND CORONARY ANGIOGRAPHY;  Surgeon: Tonny Bollman, MD;  Location: [facility] INVASIVE CV LAB;  Service: Cardiovascular;  Laterality: N/A;   LEFT HEART CATH AND CORONARY ANGIOGRAPHY N/A 09/14/2022   Procedure: LEFT HEART CATH AND CORONARY ANGIOGRAPHY;  Surgeon: Tonny Bollman,  MD;  Location: [facility] INVASIVE CV LAB;  Service: Cardiovascular;  Laterality: N/A;   Family History  Problem Relation Age of Onset   Hypertension Mother    Hyperlipidemia Mother    Alzheimer's disease Mother    Vascular Disease Mother    Glaucoma Father    Heart disease Father    Hypertension Father    Diabetes Father    Hyperlipidemia Father    Skin cancer Father    Diabetes Paternal Grandmother    Colon cancer Neg Hx    Esophageal cancer Neg Hx    Social History   Socioeconomic History   Marital status: Divorced    Spouse name: Not on file   Number of children: 2   Years of education: 13.5   Highest education level: Some college, no degree  Occupational History    Comment: retired  Tobacco Use   Smoking status: Never   Smokeless tobacco: Never  Vaping Use   Vaping Use: Never used  Substance and Sexual Activity   Alcohol use: Yes    Comment: Rare   Drug use: No   Sexual activity: Not Currently  Other Topics Concern   Not on file  Social History Narrative   Lives alone. Likes to crafts and paint in her free time.      Right Handed    Lives in a one story home    Social Determinants of Health   Financial Resource Strain: Low Risk  (05/05/2023)   Overall Financial Resource Strain (CARDIA)    Difficulty of Paying Living Expenses: Not very hard  Food Insecurity: No Food Insecurity (05/05/2023)   Hunger Vital Sign    Worried About Running Out of Food in the Last Year: Never true    Ran Out of Food in the Last Year: Never true  Transportation Needs: No Transportation Needs (05/05/2023)   PRAPARE - Administrator, Civil Service (Medical): No    Lack of Transportation (Non-Medical): No  Physical Activity: Insufficiently Active (05/05/2023)   Exercise Vital Sign    Days of Exercise per Week: 1 day    Minutes of Exercise per Session: 10 min  Stress: No Stress Concern Present (05/05/2023)   [employer] of Occupational Health - Occupational Stress Questionnaire  Feeling of Stress : Only a little  Social Connections: Moderately Integrated (05/06/2023)   Social Connection and Isolation Panel [NHANES]    Frequency of Communication with Friends and Family: More than three times a week    Frequency of Social Gatherings with Friends and Family: More than three times a week    Attends Religious Services: 1 to 4 times per year    Active Member of [employer] or Organizations: Yes    Attends Banker Meetings: More than 4 times per year    Marital Status: Divorced    Activities of Daily Living    05/05/2023   12:39 PM 08/31/2022    8:04 AM  In your present state of health, do you have any difficulty performing the following activities:  Hearing? 0 0  Vision? 0 0  Difficulty concentrating or making decisions? 0 0  Walking or climbing stairs? 1   Dressing or bathing? 0 0  Doing errands, shopping? 0   Preparing Food and eating ? N   Using the Toilet? N   In the past six months, have you accidently leaked urine? N   Do you have problems with loss of bowel control? N   Managing your Medications? N   Managing your Finances? N   Housekeeping or managing your Housekeeping? N     Patient Education/ Literacy How often do you need to have someone help you when you read instructions, pamphlets, or other written materials from your doctor or pharmacy?: 2 - Rarely What is the last grade level you completed in school?: 2 years of college  Exercise Current Exercise Habits: Home exercise routine, Type of exercise: Other - see comments;walking (physical therapy), Time (Minutes): 60, Frequency (Times/Week): 2, Weekly Exercise (Minutes/Week): 120, Intensity: Moderate, Exercise limited by: cardiac condition(s);orthopedic condition(s)  Diet Patient reports consuming 3 meals a day and 1 snack(s) a day Patient reports that her primary diet is: Regular Patient reports that she does have regular access to food.   Depression Screen    05/06/2023   10:55 AM  03/05/2023   11:13 AM 11/15/2022   11:07 AM 10/04/2022   10:43 AM 09/10/2022    3:50 PM 09/10/2022    3:08 PM 01/03/2022   10:57 AM  PHQ 2/9 Scores  PHQ - 2 Score 0 0 0 1 0 0 0  PHQ- 9 Score     1       Fall Risk    05/06/2023   10:54 AM 05/05/2023   12:39 PM 03/05/2023   11:13 AM 11/15/2022   11:07 AM 10/04/2022   10:43 AM  Fall Risk   Falls in the past year? 0 0 0 0 0  Number falls in past yr: 0  0 0 0  Injury with Fall? 0   0 0  Risk for fall due to : No Fall Risks  No Fall Risks No Fall Risks No Fall Risks  Follow up Falls evaluation completed  Falls evaluation completed Falls evaluation completed Follow up appointment     Objective:  Tara Campbell seemed alert and oriented and she participated appropriately during our telephone visit.  Blood Pressure Weight BMI  BP Readings from Last 3 Encounters:  04/30/23 (!) 142/68  04/30/23 118/70  03/05/23 (!) 99/44   Wt Readings from Last 3 Encounters:  04/30/23 184 lb (83.5 kg)  04/30/23 183 lb (83 kg)  03/05/23 183 lb 3.2 oz (83.1 kg)   BMI Readings from Last 1 Encounters:  04/30/23  31.58 kg/m    *Unable to obtain current vital signs, weight, and BMI due to telephone visit type  Hearing/Vision  Sharline did not seem to have difficulty with hearing/understanding during the telephone conversation Reports that she has had a formal eye exam by an eye care professional within the past year Reports that she has not had a formal hearing evaluation within the past year *Unable to fully assess hearing and vision during telephone visit type  Cognitive Function:    05/06/2023   10:57 AM 04/06/2021    4:01 PM  6CIT Screen  What Year? 0 points 0 points  What month? 0 points 0 points  What time? 0 points 0 points  Count back from 20 0 points 0 points  Months in reverse 0 points 0 points  Repeat phrase 0 points 0 points  Total Score 0 points 0 points   (Normal:0-7, Significant for Dysfunction: >8)  Normal Cognitive Function Screening:  Yes   Immunization & Health Maintenance Record Immunization History  Administered Date(s) Administered   COVID-19, mRNA, vaccine(Comirnaty)12 years and older 09/01/2022   Fluad Quad(high Dose 65+) 09/22/2019, 09/06/2021   Influenza, High Dose Seasonal PF 08/29/2017, 09/07/2018, 08/13/2020   Influenza-Unspecified 08/20/2022   Moderna SARS-COV2 Booster Vaccination 10/03/2021   PFIZER(Purple Top)SARS-COV-2 Vaccination 12/13/2019, 01/04/2020, 09/03/2020, 03/24/2021, 04/18/2022   Pfizer Covid-19 Vaccine Bivalent Booster 2yrs & up 10/17/2021   Pneumococcal Conjugate-13 11/16/2019   Pneumococcal Polysaccharide-23 08/29/2017   Tdap 05/29/2017   Zoster Recombinat (Shingrix) 02/28/2018, 06/12/2018    Health Maintenance  Topic Date Due   COVID-19 Vaccine (7 - 2023-24 season) 05/22/2023 (Originally 10/27/2022)   Colonoscopy  09/11/2023 (Originally 02/01/2020)   FOOT EXAM  06/01/2023   INFLUENZA VACCINE  07/04/2023   OPHTHALMOLOGY EXAM  08/24/2023   HEMOGLOBIN A1C  09/04/2023   Diabetic kidney evaluation - Urine ACR  10/19/2023   Diabetic kidney evaluation - eGFR measurement  04/29/2024   Medicare Annual Wellness (AWV)  05/05/2024   MAMMOGRAM  10/17/2024   DTaP/Tdap/Td (2 - Td or Tdap) 05/30/2027   Pneumonia Vaccine 53+ Years old  Completed   DEXA SCAN  Completed   Hepatitis C Screening  Completed   Zoster Vaccines- Shingrix  Completed   HPV VACCINES  Aged Out       Assessment  This is a routine wellness examination for [employer].  Health Maintenance: Due or Overdue There are no preventive care reminders to display for this patient.   Tara Campbell does not need a referral for Community Assistance: Care Management:   no Social Work:    no Prescription Assistance:  no Nutrition/Diabetes Education:  no   Plan:  Personalized Goals  Goals Addressed               This Visit's Progress     Patient Stated (pt-stated)        Patient stated that she would like to  build her shoulder strength to avoid surgery.        Personalized Health Maintenance & Screening Recommendations  Colorectal cancer screening  Lung Cancer Screening Recommended: no (Low Dose CT Chest recommended if Age 71-80 years, 20 pack-year currently smoking OR have quit w/in past 15 years) Hepatitis C Screening recommended: no HIV Screening recommended: no  Advanced Directives: Written information was not prepared per patient's request.  Referrals & Orders Orders Placed This Encounter  Procedures   Ambulatory referral to Gastroenterology (for Colonoscopy)    Follow-up Plan Follow-up with Agapito Games, MD  as planned Medicare wellness in one year. Patient will access AVS on my chart.   I have personally reviewed and noted the following in the patient's chart:   Medical and social history Use of alcohol, tobacco or illicit drugs  Current medications and supplements Functional ability and status Nutritional status Physical activity Advanced directives List of other physicians Hospitalizations, surgeries, and ER visits in previous 12 months Vitals Screenings to include cognitive, depression, and falls Referrals and appointments  In addition, I have reviewed and discussed with Tara Campbell certain preventive protocols, quality metrics, and best practice recommendations. A written personalized care plan for preventive services as well as general preventive health recommendations is available and can be mailed to the patient at her request.      Modesto Charon, RN BSN  05/06/2023

## 2023-05-07 ENCOUNTER — Encounter: Payer: Self-pay | Admitting: Rehabilitative and Restorative Service Providers"

## 2023-05-07 ENCOUNTER — Ambulatory Visit: Payer: PPO | Attending: Family Medicine | Admitting: Rehabilitative and Restorative Service Providers"

## 2023-05-07 DIAGNOSIS — G8929 Other chronic pain: Secondary | ICD-10-CM | POA: Diagnosis not present

## 2023-05-07 DIAGNOSIS — M6281 Muscle weakness (generalized): Secondary | ICD-10-CM | POA: Insufficient documentation

## 2023-05-07 DIAGNOSIS — R293 Abnormal posture: Secondary | ICD-10-CM | POA: Diagnosis not present

## 2023-05-07 DIAGNOSIS — R29898 Other symptoms and signs involving the musculoskeletal system: Secondary | ICD-10-CM | POA: Insufficient documentation

## 2023-05-07 DIAGNOSIS — M25512 Pain in left shoulder: Secondary | ICD-10-CM | POA: Insufficient documentation

## 2023-05-07 NOTE — Therapy (Signed)
OUTPATIENT PHYSICAL THERAPY SHOULDER TREATMENT  Progress Note Reporting Period 03/07/23 to 04/08/23  See note below for Objective Data and Assessment of Progress/Goals.     Patient Name: Tara Campbell MRN: [id number] DOB:1946/10/23, 77 y.o., female Today's Date: 05/07/2023  END OF SESSION:  PT End of Session - 05/07/23 1319     Visit Number 13    Number of Visits 25    Date for PT Re-Evaluation 07/02/23    Authorization Type [employer] $10 copay    Progress Note Due on Visit 20    PT Start Time 1317    PT Stop Time 1403    PT Time Calculation (min) 46 min    Activity Tolerance Patient tolerated treatment well             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   Otomycosis of right ear 09/27/2017   Paroxysmal atrial fibrillation (HCC)    Thyroid disease    Trochanteric bursitis of right hip 05/29/2017   Past Surgical History:  Procedure Laterality Date   ANGIOPLASTY     CARDIAC CATHETERIZATION     COLONOSCOPY W/ POLYPECTOMY  01/04/2015   CORONARY STENT INTERVENTION N/A 08/31/2022   Procedure: CORONARY STENT INTERVENTION;  Surgeon: Tonny Bollman, MD;  Location: [facility] INVASIVE CV LAB;  Service: Cardiovascular;  Laterality: N/A;   HEMORRHOID BANDING     LEFT HEART CATH AND CORONARY ANGIOGRAPHY N/A 04/26/2021   Procedure: LEFT HEART CATH AND CORONARY ANGIOGRAPHY;  Surgeon: Tonny Bollman, MD;  Location: [facility] INVASIVE CV LAB;  Service: Cardiovascular;  Laterality: N/A;   LEFT HEART CATH AND CORONARY ANGIOGRAPHY N/A 09/14/2022   Procedure: LEFT HEART CATH AND CORONARY ANGIOGRAPHY;  Surgeon: Tonny Bollman, MD;  Location: [facility] INVASIVE CV LAB;  Service:  Cardiovascular;  Laterality: N/A;   Patient Active Problem List   Diagnosis Date Noted   Coughing 01/17/2023   NSTEMI (non-ST elevated myocardial infarction) (HCC) 09/14/2022   Hypokalemia 09/14/2022   Non-STEMI (non-ST elevated myocardial infarction) (HCC) 09/14/2022   GAD (generalized anxiety disorder) 09/10/2022   Primary osteoarthritis of right knee 08/16/2022   Hypervitaminosis 01/03/2022   Memory impairment 01/03/2022   TIA (transient ischemic attack) 12/26/2021   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 10/02/2021   Obesity (BMI 30-39.9) 09/06/2021   Angina pectoris (HCC) 06/23/2021   Hyperlipidemia 04/27/2021   History of non-ST elevation myocardial infarction (NSTEMI) 04/25/2021   Hilar adenopathy 10/13/2018   Mediastinal adenopathy 10/13/2018   Dupuytren's contracture of left hand 08/28/2018   Trigger finger, left index finger 08/28/2018   Non-seasonal allergic rhinitis 08/26/2018   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   Controlled diabetes mellitus type 2 with complications (HCC) 12/27/2017   Class 1 obesity due to excess calories with serious comorbidity in adult 12/27/2017   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   Encounter for long-term (current) use of medications 11/28/2017   Anticoagulant long-term use - on eliquis for PAF 10/21/2017   Sensorineural hearing loss (SNHL) of both ears 09/27/2017   Hypertension goal BP (blood pressure) <  140/90 08/29/2017   CAD (coronary artery disease) 08/29/2017   Gout of ankle 04/08/2017   History of myocardial infarct at age greater than 60 years 01/23/2017   Mixed diabetic hyperlipidemia associated with type 2 diabetes mellitus (HCC) 01/23/2017   Acquired hypothyroidism 01/23/2017   Severe persistent allergic asthma 01/16/2017   Paroxysmal atrial fibrillation (HCC) 01/16/2017   Cardiomegaly 01/16/2017    PCP: Dr Nani Gasser  REFERRING PROVIDER: Dr Nani Gasser  REFERRING DIAG: Chronic Lt shoulder pain   THERAPY DIAG:  Chronic left shoulder pain  Other symptoms and signs involving the musculoskeletal system  Muscle weakness (generalized)  Abnormal posture  Rationale for Evaluation and Treatment: Rehabilitation  ONSET DATE: 01/31/22   SUBJECTIVE:                                                                                                                                                                                      SUBJECTIVE STATEMENT: Patient reports that her shoulder is doing better.visit with her daughter went well. She using the TENS unit every day which helps and will take the TENS. Patient reports good progress with Lt shoulder pain.    Eval:Patient reports that she has had Lt shoulder pain for years. She was diagnosed with degenerative changes in the Lt shoulder and rotator cuff changes ~ 8 years ago. Symptoms have increased in the past year.   Hand dominance: Right  PERTINENT HISTORY: HTN; MI x 2 in 2017; Lt shoulder pain; arthritic hip and knee  PAIN:  Are you having pain? Yes: NPRS scale: 2-3/10 Pain location: Lt shoulder  Pain description: tender  Aggravating factors: worse at night; lifting arm or moving the arm; putting on clothes  Relieving factors: tylenol   PRECAUTIONS: None  WEIGHT BEARING RESTRICTIONS: No  FALLS:  Has patient fallen in last 6 months? No  LIVING ENVIRONMENT: Lives with: lives with their family and lives alone Lives in: House/apartment  OCCUPATION: Retired; sedentary   PATIENT GOALS:decrease Lt shoulder pain and gain strength in Lt arm   NEXT MD VISIT: 06/07/23  OBJECTIVE:   PATIENT SURVEYS:  FOTO 50 goal 60 05/07/23: 56 POSTURE: Patient presents with head forward posture with increased thoracic kyphosis; shoulders rounded and elevated; scapulae abducted and rotated along the thoracic spine; head of the humerus anterior in orientation.   UPPER EXTREMITY ROM:    Active ROM Right eval Left eval Left  04/08/23  Shoulder flexion 139 100 108  Shoulder extension 67 37 46  Shoulder abduction 138 70 84  Shoulder adduction     Shoulder internal rotation Thumb T9 Hand to Lt lateral sacrum Hand to waist   Shoulder external  rotation 51 8 20  Elbow flexion     Elbow extension     Wrist flexion     Wrist extension     Wrist ulnar deviation     Wrist radial deviation     Wrist pronation     Wrist supination     (Blank rows = not tested)  UPPER EXTREMITY MMT: Lt UE strength assessed UE at side; unable to assess in MMT positions   MMT Right eval Left eval Left  05/07/23  Shoulder flexion 4 3- 3  Shoulder extension 5 4 4+  Shoulder abduction 4 2+ 3-  Shoulder adduction     Shoulder internal rotation 5 2+ 3  Shoulder external rotation 4 2+ 3-  Middle trapezius 4 3- 3  Lower trapezius 4 3- 3  Elbow flexion     Elbow extension     Wrist flexion     Wrist extension     Wrist ulnar deviation     Wrist radial deviation     Wrist pronation     Wrist supination     Grip strength (lbs)     (Blank rows = not tested)  JOINT MOBILITY TESTING:  Decreased mobility Lt shoulder  04/08/23: improving joint mobility  PALPATION:  Muscular tightness Lt pecs; biceps; upper trap; leveator; periscapular musculature; deltoid   04/15/23: increased palpable tightness throughout Lt shoulder girdle noted   [facility] Adult PT Treatment:          DATE: 05/07/23 Therapeutic Exercise:  Pulley shoulder horizontal ab/adduction x 10 Chin tuck w/ noodle 10 sec x 5 Scap squeeze w/noodle 10 sec x 5  L's w/noodle yellow TB 2-3 sec 10 x 2 W's w/ noodle 3 sec x 10 x 2  Corner stretch lower and mid level 30 sec x 2  Isometric row green TB x 5 sec x 10  Isometric shoulder ER green TB x 3 sec x 10  Isometric shoulder IR green TB x 3 sec x 10  Shoulder rolls backward  Ball btn hands for various movements shoulders, elbows, wrists Ball at side in sitting pressing into ball;  rolling ball fwd/back; side to side; circles CW/CCW Supine scap squeeze 5 sec x 10  Manual Therapy: Soft tissue mobilization Rt anterior shoulder; upper trap Skilled palpation to assess response to DN and manual work  Psychiatrist  Treatment instructions: Expect mild to moderate muscle soreness. S/S of pneumothorax if dry needled over a lung field, and to seek immediate medical attention should they occur. Patient verbalized understanding of these instructions and education.  Patient Consent Given: Yes Education handout provided: Previously provided Muscles treated: Lt pecs; upper trap; biceps Electrical stimulation performed: Yes Parameters: mAmp current; intensity to pt tolerance  Treatment response/outcome: decreased palpable tightness noted    Neuromuscular re-ed: Postural education and instruction focus on engaging posterior shoulder girdle musculature Modalities: TENS(home)  Cold pac Rt shoulder x 10 min  Self Care: Modifying recliner to improve posture when sitting  Using of pillows to prop phone, tablet, book when sitting   [facility] Adult PT Treatment:          DATE: 04/15/23 Therapeutic Exercise:  Pulley shoulder horizontal ab/adduction x 10 Chin tuck w/ noodle 10 sec x 5 Scap squeeze w/noodle 10 sec x 5  L's w/noodle yellow TB 2-3 sec 10 x 2 W's w/ noodle 3 sec x 10 x 2  Corner stretch lower and mid level 30 sec x 2  Isometric row green TB x  5 sec x 10  Isometric shoulder ER green TB x 3 sec x 10  Isometric shoulder IR green TB x 3 sec x 10  Shoulder rolls backward  Ball btn hands for various movements shoulders, elbows, wrists Ball at side in sitting pressing into ball; rolling ball fwd/back; side to side; circles CW/CCW Supine scap squeeze 5 sec x 10  Manual Therapy: Soft tissue mobilization Rt anterior shoulder; upper trap Skilled palpation to assess response to DN and manual work  Psychiatrist  Treatment instructions: Expect mild to  moderate muscle soreness. S/S of pneumothorax if dry needled over a lung field, and to seek immediate medical attention should they occur. Patient verbalized understanding of these instructions and education.  Patient Consent Given: Yes Education handout provided: Previously provided Muscles treated: Lt pecs; upper trap; biceps Electrical stimulation performed: Yes Parameters: mAmp current; intensity to pt tolerance  Treatment response/outcome: decreased palpable tightness noted    Neuromuscular re-ed: Postural education and instruction focus on engaging posterior shoulder girdle musculature Modalities: TENS(home)  Moist heat Rt shoulder x 10 min  Self Care: Modifying recliner to improve posture when sitting  Using of pillows to prop phone, tablet, book when sitting  PATIENT EDUCATION: Education details: POC; HEP Person educated: Patient Education method: Explanation, Demonstration, Tactile cues, Verbal cues, and Handouts Education comprehension: verbalized understanding, returned demonstration, verbal cues required, tactile cues required, and needs further education  HOME EXERCISE PROGRAM:  Access Code: URL: [email or web address] Date: 04/04/2023 Prepared by: Corlis Leak  Program Notes Standing - ball on wall - roll up and down; side to side; circles CW/CCWSitting - ball between hands:move ball lap to chest and back; push out from chest and pull back; hands in front steering wheel; hands in front elbows still move wrist up and down, move wrists back and forth; wood chop - move ball from one shoulder to opposite knee the repeat with other side Sitting - ball at left side; left hand on ball: rolling ball forward and back roll ball side to side roll ball in circles CW/CCW press into ball hold 5 sec   Exercises - Supine Quad Set  - 2 x daily - 7 x weekly - 1-2 sets - 10 reps - 5 sec  hold - Small Range Straight Leg Raise  - 2 x daily - 7 x weekly - 1 sets - 10  reps - 5 sec  hold - Supine Knee Extension Strengthening  - 2 x daily - 7 x weekly - 1-2 sets - 10 reps - 5 sec  hold - Supine Heel Slide with Strap  - 2 x daily - 7 x weekly - 1 sets - 5-10 reps - 10 sec  hold - Seated Knee Flexion Slide  - 2 x daily - 7 x weekly - 1 sets - 3 reps - 30 sec  hold - Standing Terminal Knee Extension with Resistance  - 2 x daily - 7 x weekly - 2-3 sets - 10 reps - 3-5 sec  hold - Sidelying Hip Abduction  - 1 x daily - 7 x weekly - 3 sets - 10 reps - 3-5 sec  hold - Beginner Bridge  - 2 x daily - 7 x weekly - 1 sets - 10 reps - 3-5 sec  hold - Supine Hip and Knee Flexion AROM with Swiss Ball  - 2 x daily - 7 x weekly - 1 sets - 3 reps - 30 sec  hold - Sit to Stand  -  2 x daily - 7 x weekly - 1 sets - 10 reps - 3-5 sec  hold - Hip Flexor Stretch at Edge of Bed  - 2 x daily - 7 x weekly - 1 sets - 3 reps - 30 sec  hold - Backwards Walking  - 2 x daily - 7 x weekly - 1 sets - 3 reps - 30 sec  hold Patient Education - TENS Unit - Trigger Point Dry Needling  ASSESSMENT:  CLINICAL IMPRESSION: Lt shoulder pain is decreased and patient is using UE for more functional activities. She demonstrates improving posture and alignment; increasing AROM; increasing strength Lt UE. Patient has some continued pain with palpation through anterior Lt shoulder. Note persistent tightness and stiffness in Lt shoulder with palpable tightness through Rt shoulder girdle, deep pec muscles, biceps. Working on posture and alignment as well as strengthening exercises. Will progress with posterior shoulder girdle and shoulder strengthening as tolerated. She is progressing well toward stated goals of therapy and will benefit from continued treatment to achieve maximum rehab potential.    OBJECTIVE IMPAIRMENTS: Lt shoulder pain for the past 10 or more years with increased symptoms in the past year. [name] has poor posture and alignment; limited AROM, decreased strength; decreased functional activity  and ADL's using Lt UE; pain; difficulty sleeping due to Lt shoulder pain decreased activity tolerance, decreased mobility, decreased ROM, decreased strength, hypomobility, increased fascial restrictions, impaired flexibility, impaired UE functional use, improper body mechanics, postural dysfunction, and pain.    GOALS: Goals reviewed with patient? Yes  SHORT TERM GOALS: Target date: 04/04/2023   Independent in initial HEP  Baseline: Goal status: met  2.  Improve posture and alignment with patient to demonstrate improved upright posture with posterior shoulder girdle musculature  Baseline:  Goal status: met   LONG TERM GOALS: Target date: 07/02/2023   Patient reports decreased pain Lt shoulder by 50% allowing her to sleep for longer periods of time(3-4 hours) without awaking due to pain Baseline:  Goal status: met   2.  Increase strength Lt shoulder to 3/5 to 4/5  Baseline:  Goal status: on going   3.  Increase AROM Lt shoulder by 5-10 degrees  Baseline:  Goal status: partially met   4.  Patient demonstrates/reports improved functional use of Lt UE for ADL's including light household chores  Baseline:  Goal status: partially met    5.  Independent in HEP (including aquatic program as indicated) Baseline:  Goal status: on going   6.  Improve functional limitation score to 60  Baseline: 05/07/23 56  Goal status: on going   PLAN:  PT FREQUENCY: 2x/week  PT DURATION: 8 weeks  PLANNED INTERVENTIONS: Therapeutic exercises, Therapeutic activity, Neuromuscular re-education, Patient/Family education, Self Care, Joint mobilization, Aquatic Therapy, Dry Needling, Electrical stimulation, Cryotherapy, Moist heat, Taping, Vasopneumatic device, Ultrasound, Ionotophoresis 4mg /ml Dexamethasone, Manual therapy, and Re-evaluation  PLAN FOR NEXT SESSION: review and progress with exercises and postural correction; manual work, DN, modalities as indicated    [employer], PT 05/07/2023,  1:39 PM

## 2023-05-09 ENCOUNTER — Encounter: Payer: Self-pay | Admitting: Rehabilitative and Restorative Service Providers"

## 2023-05-09 ENCOUNTER — Ambulatory Visit: Payer: PPO | Admitting: Rehabilitative and Restorative Service Providers"

## 2023-05-09 DIAGNOSIS — R293 Abnormal posture: Secondary | ICD-10-CM

## 2023-05-09 DIAGNOSIS — R29898 Other symptoms and signs involving the musculoskeletal system: Secondary | ICD-10-CM

## 2023-05-09 DIAGNOSIS — M25512 Pain in left shoulder: Secondary | ICD-10-CM | POA: Diagnosis not present

## 2023-05-09 DIAGNOSIS — G8929 Other chronic pain: Secondary | ICD-10-CM

## 2023-05-09 DIAGNOSIS — M6281 Muscle weakness (generalized): Secondary | ICD-10-CM

## 2023-05-09 NOTE — Therapy (Signed)
OUTPATIENT PHYSICAL THERAPY SHOULDER TREATMENT  Progress Note Reporting Period 03/07/23 to 04/08/23  See note below for Objective Data and Assessment of Progress/Goals.     Patient Name: Tara Campbell MRN: [id number] DOB:1946-05-05, 77 y.o., female Today's Date: 05/09/2023  END OF SESSION:  PT End of Session - 05/09/23 1016     Visit Number 14    Number of Visits 25    Date for PT Re-Evaluation 07/02/23    Authorization Type [employer] $10 copay    Progress Note Due on Visit 20    PT Start Time 1015    PT Stop Time 1103    PT Time Calculation (min) 48 min    Activity Tolerance Patient tolerated treatment well             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   Otomycosis of right ear 09/27/2017   Paroxysmal atrial fibrillation (HCC)    Thyroid disease    Trochanteric bursitis of right hip 05/29/2017   Past Surgical History:  Procedure Laterality Date   ANGIOPLASTY     CARDIAC CATHETERIZATION     COLONOSCOPY W/ POLYPECTOMY  01/04/2015   CORONARY STENT INTERVENTION N/A 08/31/2022   Procedure: CORONARY STENT INTERVENTION;  Surgeon: Tonny Bollman, MD;  Location: [facility] INVASIVE CV LAB;  Service: Cardiovascular;  Laterality: N/A;   HEMORRHOID BANDING     LEFT HEART CATH AND CORONARY ANGIOGRAPHY N/A 04/26/2021   Procedure: LEFT HEART CATH AND CORONARY ANGIOGRAPHY;  Surgeon: Tonny Bollman, MD;  Location: [facility] INVASIVE CV LAB;  Service: Cardiovascular;  Laterality: N/A;   LEFT HEART CATH AND CORONARY ANGIOGRAPHY N/A 09/14/2022   Procedure: LEFT HEART CATH AND CORONARY ANGIOGRAPHY;  Surgeon: Tonny Bollman, MD;  Location: [facility] INVASIVE CV LAB;  Service:  Cardiovascular;  Laterality: N/A;   Patient Active Problem List   Diagnosis Date Noted   Coughing 01/17/2023   NSTEMI (non-ST elevated myocardial infarction) (HCC) 09/14/2022   Hypokalemia 09/14/2022   Non-STEMI (non-ST elevated myocardial infarction) (HCC) 09/14/2022   GAD (generalized anxiety disorder) 09/10/2022   Primary osteoarthritis of right knee 08/16/2022   Hypervitaminosis 01/03/2022   Memory impairment 01/03/2022   TIA (transient ischemic attack) 12/26/2021   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 10/02/2021   Obesity (BMI 30-39.9) 09/06/2021   Angina pectoris (HCC) 06/23/2021   Hyperlipidemia 04/27/2021   History of non-ST elevation myocardial infarction (NSTEMI) 04/25/2021   Hilar adenopathy 10/13/2018   Mediastinal adenopathy 10/13/2018   Dupuytren's contracture of left hand 08/28/2018   Trigger finger, left index finger 08/28/2018   Non-seasonal allergic rhinitis 08/26/2018   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   Controlled diabetes mellitus type 2 with complications (HCC) 12/27/2017   Class 1 obesity due to excess calories with serious comorbidity in adult 12/27/2017   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   Encounter for long-term (current) use of medications 11/28/2017   Anticoagulant long-term use - on eliquis for PAF 10/21/2017   Sensorineural hearing loss (SNHL) of both ears 09/27/2017   Hypertension goal BP (blood pressure) <  140/90 08/29/2017   CAD (coronary artery disease) 08/29/2017   Gout of ankle 04/08/2017   History of myocardial infarct at age greater than 60 years 01/23/2017   Mixed diabetic hyperlipidemia associated with type 2 diabetes mellitus (HCC) 01/23/2017   Acquired hypothyroidism 01/23/2017   Severe persistent allergic asthma 01/16/2017   Paroxysmal atrial fibrillation (HCC) 01/16/2017   Cardiomegaly 01/16/2017    PCP: Dr Nani Gasser  REFERRING PROVIDER: Dr Nani Gasser  REFERRING DIAG: Chronic Lt shoulder pain   THERAPY DIAG:  Chronic left shoulder pain  Other symptoms and signs involving the musculoskeletal system  Muscle weakness (generalized)  Abnormal posture  Rationale for Evaluation and Treatment: Rehabilitation  ONSET DATE: 01/31/22   SUBJECTIVE:                                                                                                                                                                                      SUBJECTIVE STATEMENT: Patient reports that her shoulder is feeling better. Dry needling really helps. She using the TENS unit every day which helps and will take the TENS. Patient reports good progress with decreasing Lt shoulder pain and improving use of Lt UE.    Eval:Patient reports that she has had Lt shoulder pain for years. She was diagnosed with degenerative changes in the Lt shoulder and rotator cuff changes ~ 8 years ago. Symptoms have increased in the past year.   Hand dominance: Right  PERTINENT HISTORY: HTN; MI x 2 in 2017; Lt shoulder pain; arthritic hip and knee  PAIN:  Are you having pain? Yes: NPRS scale: 1/10 Pain location: Lt shoulder  Pain description: tender  Aggravating factors: worse at night; lifting arm or moving the arm; putting on clothes  Relieving factors: tylenol   PRECAUTIONS: None  WEIGHT BEARING RESTRICTIONS: No  FALLS:  Has patient fallen in last 6 months? No  LIVING ENVIRONMENT: Lives with: lives with their family and lives alone Lives in: House/apartment  OCCUPATION: Retired; sedentary   PATIENT GOALS:decrease Lt shoulder pain and gain strength in Lt arm   NEXT MD VISIT: 06/07/23  OBJECTIVE:   PATIENT SURVEYS:  FOTO 50 goal 60 05/07/23: 56 POSTURE: Patient presents with head forward posture with increased thoracic kyphosis; shoulders rounded and elevated; scapulae abducted and rotated along the thoracic spine; head of the humerus anterior in  orientation.   UPPER EXTREMITY ROM:   Active ROM Right eval Left eval Left  04/08/23  Shoulder flexion 139 100 108  Shoulder extension 67 37 46  Shoulder abduction 138 70 84  Shoulder adduction     Shoulder internal rotation Thumb T9 Hand to Lt lateral sacrum Hand  to waist   Shoulder external rotation 51 8 20  Elbow flexion     Elbow extension     Wrist flexion     Wrist extension     Wrist ulnar deviation     Wrist radial deviation     Wrist pronation     Wrist supination     (Blank rows = not tested)  UPPER EXTREMITY MMT: Lt UE strength assessed UE at side; unable to assess in MMT positions   MMT Right eval Left eval Left  05/07/23  Shoulder flexion 4 3- 3  Shoulder extension 5 4 4+  Shoulder abduction 4 2+ 3-  Shoulder adduction     Shoulder internal rotation 5 2+ 3  Shoulder external rotation 4 2+ 3-  Middle trapezius 4 3- 3  Lower trapezius 4 3- 3  Elbow flexion     Elbow extension     Wrist flexion     Wrist extension     Wrist ulnar deviation     Wrist radial deviation     Wrist pronation     Wrist supination     Grip strength (lbs)     (Blank rows = not tested)  JOINT MOBILITY TESTING:  Decreased mobility Lt shoulder  04/08/23: improving joint mobility  PALPATION:  Muscular tightness Lt pecs; biceps; upper trap; leveator; periscapular musculature; deltoid   04/15/23: increased palpable tightness throughout Lt shoulder girdle noted   [facility] Adult PT Treatment:          DATE: 05/09/23 Therapeutic Exercise:  Pulley shoulder horizontal ab/adduction x 10 Chin tuck w/ noodle 10 sec x 5 Scap squeeze w/noodle 10 sec x 5  L's w/noodle red TB 2-3 sec 10 x 2 W's w/ noodle yellow 3 sec x 10 no resistance x 10 Isometric shoulder abduction Lt shoulder at wall 3 sec x 10 x 2   Corner stretch lower and mid level 30 sec x 2  Isometric row green TB x 5 sec x 10  Isometric shoulder ER red TB x 3 sec x 10  Isometric shoulder IR green TB x 3 sec x 10  Shoulder rolls  backward  Ball btn hands for various movements shoulders, elbows, wrists x 10 reps  Ball at side in sitting pressing into ball; rolling ball fwd/back; side to side; circles CW/CCW Standing ball on table for alphabet  Supine scap squeeze 5 sec x 10  Manual Therapy: Soft tissue mobilization Rt anterior shoulder; upper trap Skilled palpation to assess response to DN and manual work   Neuromuscular re-ed: Postural education and instruction focus on engaging posterior shoulder girdle musculature Modalities: TENS(home)  MH Rt shoulder x 10 min  Self Care: Modifying recliner to improve posture when sitting  Using of pillows to prop phone, tablet, book when sitting  [facility] Adult PT Treatment:          DATE: 05/07/23 Therapeutic Exercise:  Pulley shoulder horizontal ab/adduction x 10 Chin tuck w/ noodle 10 sec x 5 Scap squeeze w/noodle 10 sec x 5  L's w/noodle yellow TB 2-3 sec 10 x 2 W's w/ noodle 3 sec x 10 x 2  Corner stretch lower and mid level 30 sec x 2  Isometric row green TB x 5 sec x 10  Isometric shoulder ER green TB x 3 sec x 10  Isometric shoulder IR green TB x 3 sec x 10  Shoulder rolls backward  Ball btn hands for various movements shoulders, elbows, wrists Ball at side in  sitting pressing into ball; rolling ball fwd/back; side to side; circles CW/CCW Supine scap squeeze 5 sec x 10  Manual Therapy: Soft tissue mobilization Rt anterior shoulder; upper trap Skilled palpation to assess response to DN and manual work  Psychiatrist  Treatment instructions: Expect mild to moderate muscle soreness. S/S of pneumothorax if dry needled over a lung field, and to seek immediate medical attention should they occur. Patient verbalized understanding of these instructions and education.  Patient Consent Given: Yes Education handout provided: Previously provided Muscles treated: Lt pecs; upper trap; biceps Electrical stimulation performed: Yes Parameters: mAmp current;  intensity to pt tolerance  Treatment response/outcome: decreased palpable tightness noted    Neuromuscular re-ed: Postural education and instruction focus on engaging posterior shoulder girdle musculature Modalities: TENS(home)  Cold pac Rt shoulder x 10 min  Self Care: Modifying recliner to improve posture when sitting  Using of pillows to prop phone, tablet, book when sitting  PATIENT EDUCATION: Education details: POC; HEP Person educated: Patient Education method: Explanation, Demonstration, Tactile cues, Verbal cues, and Handouts Education comprehension: verbalized understanding, returned demonstration, verbal cues required, tactile cues required, and needs further education  HOME EXERCISE PROGRAM:  Access Code: URL: [email or web address] Date: 04/04/2023 Prepared by: Corlis Leak  Program Notes Standing - ball on wall - roll up and down; side to side; circles CW/CCWSitting - ball between hands:move ball lap to chest and back; push out from chest and pull back; hands in front steering wheel; hands in front elbows still move wrist up and down, move wrists back and forth; wood chop - move ball from one shoulder to opposite knee the repeat with other side Sitting - ball at left side; left hand on ball: rolling ball forward and back roll ball side to side roll ball in circles CW/CCW press into ball hold 5 sec   Exercises - Supine Quad Set  - 2 x daily - 7 x weekly - 1-2 sets - 10 reps - 5 sec  hold - Small Range Straight Leg Raise  - 2 x daily - 7 x weekly - 1 sets - 10 reps - 5 sec  hold - Supine Knee Extension Strengthening  - 2 x daily - 7 x weekly - 1-2 sets - 10 reps - 5 sec  hold - Supine Heel Slide with Strap  - 2 x daily - 7 x weekly - 1 sets - 5-10 reps - 10 sec  hold - Seated Knee Flexion Slide  - 2 x daily - 7 x weekly - 1 sets - 3 reps - 30 sec  hold - Standing Terminal Knee Extension with Resistance  - 2 x daily - 7 x weekly - 2-3 sets - 10 reps -  3-5 sec  hold - Sidelying Hip Abduction  - 1 x daily - 7 x weekly - 3 sets - 10 reps - 3-5 sec  hold - Beginner Bridge  - 2 x daily - 7 x weekly - 1 sets - 10 reps - 3-5 sec  hold - Supine Hip and Knee Flexion AROM with Swiss Ball  - 2 x daily - 7 x weekly - 1 sets - 3 reps - 30 sec  hold - Sit to Stand  - 2 x daily - 7 x weekly - 1 sets - 10 reps - 3-5 sec  hold - Hip Flexor Stretch at Edge of Bed  - 2 x daily - 7 x weekly - 1 sets - 3 reps - 30  sec  hold - Backwards Walking  - 2 x daily - 7 x weekly - 1 sets - 3 reps - 30 sec  hold Patient Education - TENS Unit - Trigger Point Dry Needling  ASSESSMENT:  CLINICAL IMPRESSION: Positive response to dry needling with decreased pain and discomfort reported. Lt shoulder pain is decreased and patient is using UE for more functional activities. She demonstrates improving posture and alignment; increasing AROM; increasing strength Lt UE. Patient has some continued pain with palpation through anterior Lt shoulder. Note persistent tightness and stiffness in Lt shoulder with palpable tightness through Rt shoulder girdle, deep pec muscles, biceps. Working on posture and alignment as well as strengthening exercises. Will progress with posterior shoulder girdle and shoulder strengthening as tolerated. She is progressing well toward stated goals of therapy and will benefit from continued treatment to achieve maximum rehab potential.    OBJECTIVE IMPAIRMENTS: Lt shoulder pain for the past 10 or more years with increased symptoms in the past year. [name] has poor posture and alignment; limited AROM, decreased strength; decreased functional activity and ADL's using Lt UE; pain; difficulty sleeping due to Lt shoulder pain decreased activity tolerance, decreased mobility, decreased ROM, decreased strength, hypomobility, increased fascial restrictions, impaired flexibility, impaired UE functional use, improper body mechanics, postural dysfunction, and pain.     GOALS: Goals reviewed with patient? Yes  SHORT TERM GOALS: Target date: 04/04/2023   Independent in initial HEP  Baseline: Goal status: met  2.  Improve posture and alignment with patient to demonstrate improved upright posture with posterior shoulder girdle musculature  Baseline:  Goal status: met   LONG TERM GOALS: Target date: 07/02/2023   Patient reports decreased pain Lt shoulder by 50% allowing her to sleep for longer periods of time(3-4 hours) without awaking due to pain Baseline:  Goal status: met   2.  Increase strength Lt shoulder to 3/5 to 4/5  Baseline:  Goal status: on going   3.  Increase AROM Lt shoulder by 5-10 degrees  Baseline:  Goal status: partially met   4.  Patient demonstrates/reports improved functional use of Lt UE for ADL's including light household chores  Baseline:  Goal status: partially met    5.  Independent in HEP (including aquatic program as indicated) Baseline:  Goal status: on going   6.  Improve functional limitation score to 60  Baseline: 05/07/23 56  Goal status: on going   PLAN:  PT FREQUENCY: 2x/week  PT DURATION: 8 weeks  PLANNED INTERVENTIONS: Therapeutic exercises, Therapeutic activity, Neuromuscular re-education, Patient/Family education, Self Care, Joint mobilization, Aquatic Therapy, Dry Needling, Electrical stimulation, Cryotherapy, Moist heat, Taping, Vasopneumatic device, Ultrasound, Ionotophoresis 4mg /ml Dexamethasone, Manual therapy, and Re-evaluation  PLAN FOR NEXT SESSION: review and progress with exercises and postural correction; manual work, DN, modalities as indicated    [employer], PT 05/09/2023, 10:21 AM

## 2023-05-22 DIAGNOSIS — T753XXA Motion sickness, initial encounter: Secondary | ICD-10-CM | POA: Diagnosis not present

## 2023-05-22 DIAGNOSIS — M25519 Pain in unspecified shoulder: Secondary | ICD-10-CM | POA: Diagnosis not present

## 2023-05-22 LAB — LAB REPORT - SCANNED
EGFR: 23
POC INR: 1.28

## 2023-05-28 ENCOUNTER — Ambulatory Visit: Payer: PPO | Admitting: Rehabilitative and Restorative Service Providers"

## 2023-05-30 ENCOUNTER — Encounter: Payer: Self-pay | Admitting: Rehabilitative and Restorative Service Providers"

## 2023-05-30 ENCOUNTER — Ambulatory Visit: Payer: PPO | Admitting: Rehabilitative and Restorative Service Providers"

## 2023-05-30 DIAGNOSIS — G8929 Other chronic pain: Secondary | ICD-10-CM

## 2023-05-30 DIAGNOSIS — M6281 Muscle weakness (generalized): Secondary | ICD-10-CM

## 2023-05-30 DIAGNOSIS — M25512 Pain in left shoulder: Secondary | ICD-10-CM | POA: Diagnosis not present

## 2023-05-30 DIAGNOSIS — R29898 Other symptoms and signs involving the musculoskeletal system: Secondary | ICD-10-CM

## 2023-05-30 DIAGNOSIS — R293 Abnormal posture: Secondary | ICD-10-CM

## 2023-05-30 NOTE — Therapy (Signed)
OUTPATIENT PHYSICAL THERAPY SHOULDER TREATMENT  Reporting Period 03/07/23 to 04/08/23  See note below for Objective Data and Assessment of Progress/Goals.     Patient Name: Tara Campbell MRN: [id number] DOB:09/17/1946, 77 y.o., female Today's Date: 05/30/2023  END OF SESSION:  PT End of Session - 05/30/23 1404     Visit Number 15    Number of Visits 25    Date for PT Re-Evaluation 07/02/23    Authorization Type [employer] $10 copay    Progress Note Due on Visit 20    PT Start Time 1400    PT Stop Time 1449    PT Time Calculation (min) 49 min    Activity Tolerance Patient tolerated treatment well             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   Otomycosis of right ear 09/27/2017   Paroxysmal atrial fibrillation (HCC)    Thyroid disease    Trochanteric bursitis of right hip 05/29/2017   Past Surgical History:  Procedure Laterality Date   ANGIOPLASTY     CARDIAC CATHETERIZATION     COLONOSCOPY W/ POLYPECTOMY  01/04/2015   CORONARY STENT INTERVENTION N/A 08/31/2022   Procedure: CORONARY STENT INTERVENTION;  Surgeon: Tonny Bollman, MD;  Location: [facility] INVASIVE CV LAB;  Service: Cardiovascular;  Laterality: N/A;   HEMORRHOID BANDING     LEFT HEART CATH AND CORONARY ANGIOGRAPHY N/A 04/26/2021   Procedure: LEFT HEART CATH AND CORONARY ANGIOGRAPHY;  Surgeon: Tonny Bollman, MD;  Location: [facility] INVASIVE CV LAB;  Service: Cardiovascular;  Laterality: N/A;   LEFT HEART CATH AND CORONARY ANGIOGRAPHY N/A 09/14/2022   Procedure: LEFT HEART CATH AND CORONARY ANGIOGRAPHY;  Surgeon: Tonny Bollman, MD;  Location: [facility] INVASIVE CV LAB;  Service: Cardiovascular;  Laterality:  N/A;   Patient Active Problem List   Diagnosis Date Noted   Coughing 01/17/2023   NSTEMI (non-ST elevated myocardial infarction) (HCC) 09/14/2022   Hypokalemia 09/14/2022   Non-STEMI (non-ST elevated myocardial infarction) (HCC) 09/14/2022   GAD (generalized anxiety disorder) 09/10/2022   Primary osteoarthritis of right knee 08/16/2022   Hypervitaminosis 01/03/2022   Memory impairment 01/03/2022   TIA (transient ischemic attack) 12/26/2021   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 10/02/2021   Obesity (BMI 30-39.9) 09/06/2021   Angina pectoris (HCC) 06/23/2021   Hyperlipidemia 04/27/2021   History of non-ST elevation myocardial infarction (NSTEMI) 04/25/2021   Hilar adenopathy 10/13/2018   Mediastinal adenopathy 10/13/2018   Dupuytren's contracture of left hand 08/28/2018   Trigger finger, left index finger 08/28/2018   Non-seasonal allergic rhinitis 08/26/2018   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   Controlled diabetes mellitus type 2 with complications (HCC) 12/27/2017   Class 1 obesity due to excess calories with serious comorbidity in adult 12/27/2017   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   Encounter for long-term (current) use of medications 11/28/2017   Anticoagulant long-term use - on eliquis for PAF 10/21/2017   Sensorineural hearing loss (SNHL) of both ears 09/27/2017   Hypertension goal BP (blood pressure) < 140/90 08/29/2017  CAD (coronary artery disease) 08/29/2017   Gout of ankle 04/08/2017   History of myocardial infarct at age greater than 60 years 01/23/2017   Mixed diabetic hyperlipidemia associated with type 2 diabetes mellitus (HCC) 01/23/2017   Acquired hypothyroidism 01/23/2017   Severe persistent allergic asthma 01/16/2017   Paroxysmal atrial fibrillation (HCC) 01/16/2017   Cardiomegaly 01/16/2017    PCP: Dr Nani Gasser  REFERRING PROVIDER: Dr Nani Gasser  REFERRING DIAG:  Chronic Lt shoulder pain   THERAPY DIAG:  Chronic left shoulder pain  Other symptoms and signs involving the musculoskeletal system  Muscle weakness (generalized)  Abnormal posture  Rationale for Evaluation and Treatment: Rehabilitation  ONSET DATE: 01/31/22   SUBJECTIVE:                                                                                                                                                                                      SUBJECTIVE STATEMENT: Patient reports that she has some continued pain and discomfort in the L shoulder. Shoulder did well during vacation. She is not having to use the TENS unit in the past 2 weeks. Patient reports good progress with decreasing Lt shoulder pain and improving use of Lt UE.    Eval:Patient reports that she has had Lt shoulder pain for years. She was diagnosed with degenerative changes in the Lt shoulder and rotator cuff changes ~ 8 years ago. Symptoms have increased in the past year.   Hand dominance: Right  PERTINENT HISTORY: HTN; MI x 2 in 2017; Lt shoulder pain; arthritic hip and knee  PAIN:  Are you having pain? Yes: NPRS scale: 0/10 Pain location: Lt shoulder  Pain description: tender  Aggravating factors: worse at night; lifting arm or moving the arm; putting on clothes  Relieving factors: tylenol   PRECAUTIONS: None  WEIGHT BEARING RESTRICTIONS: No  FALLS:  Has patient fallen in last 6 months? No  LIVING ENVIRONMENT: Lives with: lives with their family and lives alone Lives in: House/apartment  OCCUPATION: Retired; sedentary   PATIENT GOALS:decrease Lt shoulder pain and gain strength in Lt arm   NEXT MD VISIT: 06/07/23  OBJECTIVE:   PATIENT SURVEYS:  FOTO 50 goal 60 05/07/23: 56 POSTURE: Patient presents with head forward posture with increased thoracic kyphosis; shoulders rounded and elevated; scapulae abducted and rotated along the thoracic spine; head of the humerus anterior in  orientation.   UPPER EXTREMITY ROM:   Active ROM Right eval Left eval Left  04/08/23 Left  05/30/23  Shoulder flexion 139 100 108 112  Shoulder extension 67 37 46 53  Shoulder abduction 138 70 84 109  Shoulder adduction      Shoulder  internal rotation Thumb T9 Hand to Lt lateral sacrum Hand to waist  Hand to T 11  Shoulder external rotation 51 8 20 25   Elbow flexion      Elbow extension      Wrist flexion      Wrist extension      Wrist ulnar deviation      Wrist radial deviation      Wrist pronation      Wrist supination      (Blank rows = not tested)  UPPER EXTREMITY MMT: Lt UE strength assessed UE at side; unable to assess in MMT positions   MMT Right eval Left eval Left  05/07/23  Shoulder flexion 4 3- 3  Shoulder extension 5 4 4+  Shoulder abduction 4 2+ 3-  Shoulder adduction     Shoulder internal rotation 5 2+ 3  Shoulder external rotation 4 2+ 3-  Middle trapezius 4 3- 3  Lower trapezius 4 3- 3  Elbow flexion     Elbow extension     Wrist flexion     Wrist extension     Wrist ulnar deviation     Wrist radial deviation     Wrist pronation     Wrist supination     Grip strength (lbs)     (Blank rows = not tested)  JOINT MOBILITY TESTING:  Decreased mobility Lt shoulder  04/08/23: improving joint mobility  PALPATION:  Muscular tightness Lt pecs; biceps; upper trap; leveator; periscapular musculature; deltoid   04/15/23: increased palpable tightness throughout Lt shoulder girdle noted   [facility] Adult PT Treatment:          DATE: 05/30/23 Therapeutic Exercise: Chin tuck w/ noodle 10 sec x 5 Scap squeeze w/noodle 10 sec x 5  L's w/noodle red TB 2-3 sec 10 x 2 W's w/ noodle yellow 3 sec x 10 no resistance x 10 Isometric shoulder abduction Lt shoulder at wall 3 sec x 10 x 2   Corner stretch lower and mid level 30 sec x 2  Isometric row green TB x 5 sec x 10  Isometric shoulder ER red TB x 3 sec x 10  Isometric shoulder IR green TB x 3 sec x 10  Shoulder  rolls backward  Ball btn hands for various movements shoulders, elbows, wrists x 10 reps  Ball at side in sitting pressing into ball; rolling ball fwd/back; side to side; circles CW/CCW Standing ball on table for alphabet  Supine scap squeeze 5 sec x 10  Manual Therapy: Soft tissue mobilization Rt anterior shoulder; upper trap Skilled palpation to assess response to DN and manual work   Neuromuscular re-ed: Postural education and instruction focus on engaging posterior shoulder girdle musculature Modalities: TENS(home)  MH Rt shoulder x 10 min  Self Care: Modifying recliner to improve posture when sitting  Using of pillows to prop phone, tablet, book when sitting  [facility] Adult PT Treatment:          DATE: 05/09/23 Therapeutic Exercise:  Pulley shoulder horizontal ab/adduction x 10 Chin tuck w/ noodle 10 sec x 5 Scap squeeze w/noodle 10 sec x 5  L's w/noodle red TB 2-3 sec 10 x 2 W's w/ noodle yellow 3 sec x 10 no resistance x 10 Isometric shoulder abduction Lt shoulder at wall 3 sec x 10 x 2   Corner stretch lower and mid level 30 sec x 2  Isometric row green TB x 5 sec x 10  Isometric shoulder ER red TB x  3 sec x 10  Isometric shoulder IR green TB x 3 sec x 10  Shoulder rolls backward  Ball btn hands for various movements shoulders, elbows, wrists x 10 reps  Ball at side in sitting pressing into ball; rolling ball fwd/back; side to side; circles CW/CCW Standing ball on table for alphabet  Supine scap squeeze 5 sec x 10  Manual Therapy: Soft tissue mobilization Rt anterior shoulder; upper trap Skilled palpation to assess response to DN and manual work   Neuromuscular re-ed: Postural education and instruction focus on engaging posterior shoulder girdle musculature Modalities: TENS(home)  MH Rt shoulder x 10 min  Self Care: Modifying recliner to improve posture when sitting  Using of pillows to prop phone, tablet, book when sitting  [facility] Adult PT Treatment: (dry needling)          DATE: 05/07/23 Therapeutic Exercise:  Pulley shoulder horizontal ab/adduction x 10 Chin tuck w/ noodle 10 sec x 5 Scap squeeze w/noodle 10 sec x 5  L's w/noodle yellow TB 2-3 sec 10 x 2 W's w/ noodle 3 sec x 10 x 2  Corner stretch lower and mid level 30 sec x 2  Isometric row green TB x 5 sec x 10  Isometric shoulder ER green TB x 3 sec x 10  Isometric shoulder IR green TB x 3 sec x 10  Shoulder rolls backward  Ball btn hands for various movements shoulders, elbows, wrists Ball at side in sitting pressing into ball; rolling ball fwd/back; side to side; circles CW/CCW Supine scap squeeze 5 sec x 10  Manual Therapy: Soft tissue mobilization Rt anterior shoulder; upper trap Skilled palpation to assess response to DN and manual work  Psychiatrist  Treatment instructions: Expect mild to moderate muscle soreness. S/S of pneumothorax if dry needled over a lung field, and to seek immediate medical attention should they occur. Patient verbalized understanding of these instructions and education.  Patient Consent Given: Yes Education handout provided: Previously provided Muscles treated: Lt pecs; upper trap; biceps Electrical stimulation performed: Yes Parameters: mAmp current; intensity to pt tolerance  Treatment response/outcome: decreased palpable tightness noted    Neuromuscular re-ed: Postural education and instruction focus on engaging posterior shoulder girdle musculature Modalities: TENS(home)  Cold pac Rt shoulder x 10 min  Self Care: Modifying recliner to improve posture when sitting  Using of pillows to prop phone, tablet, book when sitting  PATIENT EDUCATION: Education details: POC; HEP Person educated: Patient Education method: Explanation, Demonstration, Tactile cues, Verbal cues, and Handouts Education comprehension: verbalized understanding, returned demonstration, verbal cues required, tactile cues required, and needs further education  HOME EXERCISE  PROGRAM:  Access Code: URL: [email or web address] Date: 04/04/2023 Prepared by: Corlis Leak  Program Notes Standing - ball on wall - roll up and down; side to side; circles CW/CCWSitting - ball between hands:move ball lap to chest and back; push out from chest and pull back; hands in front steering wheel; hands in front elbows still move wrist up and down, move wrists back and forth; wood chop - move ball from one shoulder to opposite knee the repeat with other side Sitting - ball at left side; left hand on ball: rolling ball forward and back roll ball side to side roll ball in circles CW/CCW press into ball hold 5 sec   Exercises - Supine Quad Set  - 2 x daily - 7 x weekly - 1-2 sets - 10 reps - 5 sec  hold - Small Range Straight Leg  Raise  - 2 x daily - 7 x weekly - 1 sets - 10 reps - 5 sec  hold - Supine Knee Extension Strengthening  - 2 x daily - 7 x weekly - 1-2 sets - 10 reps - 5 sec  hold - Supine Heel Slide with Strap  - 2 x daily - 7 x weekly - 1 sets - 5-10 reps - 10 sec  hold - Seated Knee Flexion Slide  - 2 x daily - 7 x weekly - 1 sets - 3 reps - 30 sec  hold - Standing Terminal Knee Extension with Resistance  - 2 x daily - 7 x weekly - 2-3 sets - 10 reps - 3-5 sec  hold - Sidelying Hip Abduction  - 1 x daily - 7 x weekly - 3 sets - 10 reps - 3-5 sec  hold - Beginner Bridge  - 2 x daily - 7 x weekly - 1 sets - 10 reps - 3-5 sec  hold - Supine Hip and Knee Flexion AROM with Swiss Ball  - 2 x daily - 7 x weekly - 1 sets - 3 reps - 30 sec  hold - Sit to Stand  - 2 x daily - 7 x weekly - 1 sets - 10 reps - 3-5 sec  hold - Hip Flexor Stretch at Edge of Bed  - 2 x daily - 7 x weekly - 1 sets - 3 reps - 30 sec  hold - Backwards Walking  - 2 x daily - 7 x weekly - 1 sets - 3 reps - 30 sec  hold Patient Education - TENS Unit - Trigger Point Dry Needling  ASSESSMENT:  CLINICAL IMPRESSION: Patient reports continued improvement in L shoulder with less pain and  improving functional activity. She has minimal pain and is working on exercises at home. She has responded well to treatment including manual work, dry needling, PROM, stretching, strengthening. She demonstrated gains in AROM L shoulder.  Note persistent tightness in Lt shoulder with palpable tightness through Rt shoulder girdle, deep pec muscles, biceps. Will progress with posterior shoulder girdle and shoulder strengthening as tolerated. She is progressing well toward stated goals of therapy and will benefit from continued treatment to achieve maximum rehab potential.    OBJECTIVE IMPAIRMENTS: Lt shoulder pain for the past 10 or more years with increased symptoms in the past year. [name] has poor posture and alignment; limited AROM, decreased strength; decreased functional activity and ADL's using Lt UE; pain; difficulty sleeping due to Lt shoulder pain decreased activity tolerance, decreased mobility, decreased ROM, decreased strength, hypomobility, increased fascial restrictions, impaired flexibility, impaired UE functional use, improper body mechanics, postural dysfunction, and pain.    GOALS: Goals reviewed with patient? Yes  SHORT TERM GOALS: Target date: 04/04/2023   Independent in initial HEP  Baseline: Goal status: met  2.  Improve posture and alignment with patient to demonstrate improved upright posture with posterior shoulder girdle musculature  Baseline:  Goal status: met   LONG TERM GOALS: Target date: 07/02/2023   Patient reports decreased pain Lt shoulder by 50% allowing her to sleep for longer periods of time(3-4 hours) without awaking due to pain Baseline:  Goal status: met   2.  Increase strength Lt shoulder to 3/5 to 4/5  Baseline:  Goal status: on going   3.  Increase AROM Lt shoulder by 5-10 degrees  Baseline:  Goal status: partially met   4.  Patient demonstrates/reports improved functional use of Lt UE  for ADL's including light household chores  Baseline:   Goal status: partially met    5.  Independent in HEP (including aquatic program as indicated) Baseline:  Goal status: on going   6.  Improve functional limitation score to 60  Baseline: 05/07/23 56  Goal status: on going   PLAN:  PT FREQUENCY: 2x/week  PT DURATION: 8 weeks  PLANNED INTERVENTIONS: Therapeutic exercises, Therapeutic activity, Neuromuscular re-education, Patient/Family education, Self Care, Joint mobilization, Aquatic Therapy, Dry Needling, Electrical stimulation, Cryotherapy, Moist heat, Taping, Vasopneumatic device, Ultrasound, Ionotophoresis 4mg /ml Dexamethasone, Manual therapy, and Re-evaluation  PLAN FOR NEXT SESSION: review and progress with exercises and postural correction; manual work, DN, modalities as indicated    [employer], PT 05/30/2023, 2:05 PM

## 2023-06-03 ENCOUNTER — Encounter: Payer: Self-pay | Admitting: Rehabilitative and Restorative Service Providers"

## 2023-06-03 ENCOUNTER — Ambulatory Visit: Payer: PPO | Attending: Family Medicine | Admitting: Rehabilitative and Restorative Service Providers"

## 2023-06-03 DIAGNOSIS — M25512 Pain in left shoulder: Secondary | ICD-10-CM | POA: Diagnosis not present

## 2023-06-03 DIAGNOSIS — M6281 Muscle weakness (generalized): Secondary | ICD-10-CM | POA: Diagnosis not present

## 2023-06-03 DIAGNOSIS — R293 Abnormal posture: Secondary | ICD-10-CM | POA: Diagnosis not present

## 2023-06-03 DIAGNOSIS — G8929 Other chronic pain: Secondary | ICD-10-CM | POA: Diagnosis not present

## 2023-06-03 DIAGNOSIS — R29898 Other symptoms and signs involving the musculoskeletal system: Secondary | ICD-10-CM | POA: Diagnosis not present

## 2023-06-03 NOTE — Therapy (Signed)
OUTPATIENT PHYSICAL THERAPY SHOULDER TREATMENT  Reporting Period 03/07/23 to 04/08/23  See note below for Objective Data and Assessment of Progress/Goals.     Patient Name: Tara Campbell MRN: [id number] DOB:[birth year], 77 y.o., female Today's Date: 06/03/2023  END OF SESSION:  PT End of Session - 06/03/23 1403     Visit Number 16    Number of Visits 25    Date for PT Re-Evaluation 07/02/23    Authorization Type [employer] $10 copay    Progress Note Due on Visit 20             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   Otomycosis of right ear 09/27/2017   Paroxysmal atrial fibrillation (HCC)    Thyroid disease    Trochanteric bursitis of right hip 05/29/2017   Past Surgical History:  Procedure Laterality Date   ANGIOPLASTY     CARDIAC CATHETERIZATION     COLONOSCOPY W/ POLYPECTOMY  01/04/2015   CORONARY STENT INTERVENTION N/A 08/31/2022   Procedure: CORONARY STENT INTERVENTION;  Surgeon: Tonny Bollman, MD;  Location: [facility] INVASIVE CV LAB;  Service: Cardiovascular;  Laterality: N/A;   HEMORRHOID BANDING     LEFT HEART CATH AND CORONARY ANGIOGRAPHY N/A 04/26/2021   Procedure: LEFT HEART CATH AND CORONARY ANGIOGRAPHY;  Surgeon: Tonny Bollman, MD;  Location: [facility] INVASIVE CV LAB;  Service: Cardiovascular;  Laterality: N/A;   LEFT HEART CATH AND CORONARY ANGIOGRAPHY N/A 09/14/2022   Procedure: LEFT HEART CATH AND CORONARY ANGIOGRAPHY;  Surgeon: Tonny Bollman, MD;  Location: [facility] INVASIVE CV LAB;  Service: Cardiovascular;  Laterality: N/A;   Patient Active Problem List   Diagnosis Date Noted   Coughing 01/17/2023   NSTEMI (non-ST elevated myocardial infarction)  (HCC) 09/14/2022   Hypokalemia 09/14/2022   Non-STEMI (non-ST elevated myocardial infarction) (HCC) 09/14/2022   GAD (generalized anxiety disorder) 09/10/2022   Primary osteoarthritis of right knee 08/16/2022   Hypervitaminosis 01/03/2022   Memory impairment 01/03/2022   TIA (transient ischemic attack) 12/26/2021   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 10/02/2021   Obesity (BMI 30-39.9) 09/06/2021   Angina pectoris (HCC) 06/23/2021   Hyperlipidemia 04/27/2021   History of non-ST elevation myocardial infarction (NSTEMI) 04/25/2021   Hilar adenopathy 10/13/2018   Mediastinal adenopathy 10/13/2018   Dupuytren's contracture of left hand 08/28/2018   Trigger finger, left index finger 08/28/2018   Non-seasonal allergic rhinitis 08/26/2018   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   Controlled diabetes mellitus type 2 with complications (HCC) 12/27/2017   Class 1 obesity due to excess calories with serious comorbidity in adult 12/27/2017   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   Encounter for long-term (current) use of medications 11/28/2017   Anticoagulant long-term use - on eliquis for PAF 10/21/2017   Sensorineural hearing loss (SNHL) of both ears 09/27/2017   Hypertension goal BP (blood pressure) < 140/90 08/29/2017   CAD (coronary artery disease) 08/29/2017   Gout of ankle 04/08/2017   History of myocardial infarct at age greater than 60 years 01/23/2017   Mixed diabetic hyperlipidemia associated  with type 2 diabetes mellitus (HCC) 01/23/2017   Acquired hypothyroidism 01/23/2017   Severe persistent allergic asthma 01/16/2017   Paroxysmal atrial fibrillation (HCC) 01/16/2017   Cardiomegaly 01/16/2017    PCP: Dr Nani Gasser  REFERRING PROVIDER: Dr Nani Gasser  REFERRING DIAG: Chronic Lt shoulder pain   THERAPY DIAG:  Chronic left shoulder pain  Other symptoms and signs involving the musculoskeletal  system  Muscle weakness (generalized)  Abnormal posture  Rationale for Evaluation and Treatment: Rehabilitation  ONSET DATE: 01/31/22   SUBJECTIVE:                                                                                                                                                                                      SUBJECTIVE STATEMENT: Patient reports that she has some continued pain and discomfort in the L shoulder but it is much better than it was a month ago. She is not having to use the TENS unit in the past 2 weeks. Patient reports good progress with decreasing Lt shoulder pain and improving use of Lt UE.    Eval:Patient reports that she has had Lt shoulder pain for years. She was diagnosed with degenerative changes in the Lt shoulder and rotator cuff changes ~ 8 years ago. Symptoms have increased in the past year.   Hand dominance: Right  PERTINENT HISTORY: HTN; MI x 2 in 2017; Lt shoulder pain; arthritic hip and knee  PAIN:  Are you having pain? Yes: NPRS scale: 0/10 Pain location: Lt shoulder  Pain description: tender  Aggravating factors: worse at night; lifting arm or moving the arm; putting on clothes  Relieving factors: tylenol   PRECAUTIONS: None  WEIGHT BEARING RESTRICTIONS: No  FALLS:  Has patient fallen in last 6 months? No  LIVING ENVIRONMENT: Lives with: lives with their family and lives alone Lives in: House/apartment  OCCUPATION: Retired; sedentary   PATIENT GOALS:decrease Lt shoulder pain and gain strength in Lt arm   NEXT MD VISIT: 06/07/23  OBJECTIVE:   PATIENT SURVEYS:  FOTO 50 goal 60 05/07/23: 56 POSTURE: Patient presents with head forward posture with increased thoracic kyphosis; shoulders rounded and elevated; scapulae abducted and rotated along the thoracic spine; head of the humerus anterior in orientation.   UPPER EXTREMITY ROM:   Active ROM Right eval Left eval Left  04/08/23 Left  05/30/23  Shoulder flexion 139 100  108 112  Shoulder extension 67 37 46 53  Shoulder abduction 138 70 84 109  Shoulder adduction      Shoulder internal rotation Thumb T9 Hand to Lt lateral sacrum Hand to waist  Hand to T 11  Shoulder external rotation 51 8 20  25  Elbow flexion      Elbow extension      Wrist flexion      Wrist extension      Wrist ulnar deviation      Wrist radial deviation      Wrist pronation      Wrist supination      (Blank rows = not tested)  UPPER EXTREMITY MMT: Lt UE strength assessed UE at side; unable to assess in MMT positions   MMT Right eval Left eval Left  05/07/23  Shoulder flexion 4 3- 3  Shoulder extension 5 4 4+  Shoulder abduction 4 2+ 3-  Shoulder adduction     Shoulder internal rotation 5 2+ 3  Shoulder external rotation 4 2+ 3-  Middle trapezius 4 3- 3  Lower trapezius 4 3- 3  Elbow flexion     Elbow extension     Wrist flexion     Wrist extension     Wrist ulnar deviation     Wrist radial deviation     Wrist pronation     Wrist supination     Grip strength (lbs)     (Blank rows = not tested)  JOINT MOBILITY TESTING:  Decreased mobility Lt shoulder  04/08/23: improving joint mobility  PALPATION:  Muscular tightness Lt pecs; biceps; upper trap; leveator; periscapular musculature; deltoid   04/15/23: increased palpable tightness throughout Lt shoulder girdle noted   [facility] Adult PT Treatment:          DATE: 06/03/23 Therapeutic Exercise: Chin tuck w/ noodle 10 sec x 5 Scap squeeze w/noodle 10 sec x 5  L's w/noodle red TB 2-3 sec 10 x 2 W's w/ noodle red 3 sec x 10 no resistance x 10 Isometric shoulder abduction Lt shoulder at wall 3 sec x 10 x 2   Corner stretch lower and mid level 30 sec x 2  Isometric row green TB x 5 sec x 10  Isometric shoulder ER green TB x 3 sec x 10  Isometric shoulder IR green TB x 3 sec x 10  Shoulder rolls backward  Theraband flexbar x ~ 1 min x 2  Ball btn hands for various movements shoulders, elbows, wrists x 10 reps  Ball at side  in sitting pressing into ball; rolling ball fwd/back; side to side; circles CW/CCW Wall push up x 5 x 2  Standing ball on table for alphabet  Supine scap squeeze 5 sec x 10  Rhythmic stabilization supine  Manual Therapy: Soft tissue mobilization Rt anterior shoulder; upper trap Skilled palpation to assess response to DN and manual work   Neuromuscular re-ed: Postural education and instruction focus on engaging posterior shoulder girdle musculature Modalities: TENS(home)  MH Rt shoulder x 10 min  Self Care: Modifying recliner to improve posture when sitting  Using of pillows to prop phone, tablet, book when sitting  [facility] Adult PT Treatment:          DATE: 05/30/23 Therapeutic Exercise: Chin tuck w/ noodle 10 sec x 5 Scap squeeze w/noodle 10 sec x 5  L's w/noodle red TB 2-3 sec 10 x 2 W's w/ noodle yellow 3 sec x 10 no resistance x 10 Isometric shoulder abduction Lt shoulder at wall 3 sec x 10 x 2   Corner stretch lower and mid level 30 sec x 2  Isometric row green TB x 5 sec x 10  Isometric shoulder ER red TB x 3 sec x 10  Isometric shoulder IR green TB  x 3 sec x 10  Shoulder rolls backward  Ball btn hands for various movements shoulders, elbows, wrists x 10 reps  Ball at side in sitting pressing into ball; rolling ball fwd/back; side to side; circles CW/CCW Standing ball on table for alphabet  Supine scap squeeze 5 sec x 10  Manual Therapy: Soft tissue mobilization Rt anterior shoulder; upper trap Skilled palpation to assess response to DN and manual work   Neuromuscular re-ed: Postural education and instruction focus on engaging posterior shoulder girdle musculature Modalities: TENS(home)  MH Rt shoulder x 10 min  Self Care: Modifying recliner to improve posture when sitting  Using of pillows to prop phone, tablet, book when sitting   [facility] Adult PT Treatment: (dry needling)         DATE: 05/07/23 Therapeutic Exercise:  Pulley shoulder horizontal ab/adduction x 10 Chin  tuck w/ noodle 10 sec x 5 Scap squeeze w/noodle 10 sec x 5  L's w/noodle yellow TB 2-3 sec 10 x 2 W's w/ noodle 3 sec x 10 x 2  Wall slide bilat UE's up wall to pt tolerance x 10 x 2  Corner stretch lower and mid level 30 sec x 2  Isometric row green TB x 5 sec x 10  Isometric shoulder ER green TB x 3 sec x 10  Isometric shoulder IR green TB x 3 sec x 10  Shoulder rolls backward  Ball btn hands for various movements shoulders, elbows, wrists Ball at side in sitting pressing into ball; rolling ball fwd/back; side to side; circles CW/CCW Supine scap squeeze 5 sec x 10  Manual Therapy: Soft tissue mobilization Rt anterior shoulder; upper trap Skilled palpation to assess response to DN and manual work  Psychiatrist  Treatment instructions: Expect mild to moderate muscle soreness. S/S of pneumothorax if dry needled over a lung field, and to seek immediate medical attention should they occur. Patient verbalized understanding of these instructions and education.  Patient Consent Given: Yes Education handout provided: Previously provided Muscles treated: Lt pecs; upper trap; biceps Electrical stimulation performed: Yes Parameters: mAmp current; intensity to pt tolerance  Treatment response/outcome: decreased palpable tightness noted    Neuromuscular re-ed: Postural education and instruction focus on engaging posterior shoulder girdle musculature Modalities: TENS(home)  Cold pac Rt shoulder x 10 min  Self Care: Modifying recliner to improve posture when sitting  Using of pillows to prop phone, tablet, book when sitting  PATIENT EDUCATION: Education details: POC; HEP Person educated: Patient Education method: Explanation, Demonstration, Tactile cues, Verbal cues, and Handouts Education comprehension: verbalized understanding, returned demonstration, verbal cues required, tactile cues required, and needs further education  HOME EXERCISE PROGRAM:  Access Code:  [email or web address] URL: [email or web address] Date: 06/03/2023 Prepared by: Corlis Leak  Exercises - Seated Shoulder Flexion AAROM with Pulley Behind  - 2 x daily - 7 x weekly - 1 sets - 10 reps - 10 sec  hold - Seated Shoulder Scaption AAROM with Pulley at Side  - 2 x daily - 7 x weekly - 1 sets - 10 reps - 10sec  hold - Seated Cervical Retraction  - 3 x daily - 7 x weekly - 1 sets - 10 reps - 5-10 hold - Standing Scapular Retraction  - 3 x daily - 7 x weekly - 1 sets - 10 reps - 10 hold - Shoulder External Rotation and Scapular Retraction  - 3 x daily - 7 x weekly - 1 sets - 10 reps -   hold -  Supine Scapular Retraction  - 2 x daily - 7 x weekly - 1 sets - 10 reps - 5-10 sec  hold - Shoulder External Rotation and Scapular Retraction with Resistance  - 2 x daily - 7 x weekly - 1 sets - 10 reps - 3-5 sec  hold - Isometric Shoulder Extension at Wall  - 2 x daily - 7 x weekly - 1 sets - 5-10 reps - 5 sec  hold - Isometric Shoulder External Rotation at Wall  - 2 x daily - 7 x weekly - 1 sets - 5 reps - 5 sec  hold - Standing Backward Shoulder Rolls  - 2 x daily - 7 x weekly - 1 sets - 10 reps - 1-2 sec  hold - Corner Pec Major Stretch  - 2 x daily - 7 x weekly - 1 sets - 3 reps - 30 sec  hold - Standing Shoulder Row Reactive Isometric  - 1 x daily - 7 x weekly - 1-2 sets - 10 reps - 5 sec  hold - External Rotation Reactive Isometrics with Flex Bar  - 1 x daily - 7 x weekly - 1-2 sets - 10 reps - 5 sec  hold - Shoulder Internal Rotation Reactive Isometrics  - 1 x daily - 7 x weekly - 1-2 sets - 10 reps - 5 sec  hold - Standing shoulder flexion wall slides  - 2 x daily - 7 x weekly - 1 sets - 5-10 reps - 5 sec  hold - Wall Push Up  - 1 x daily - 7 x weekly - 1-3 sets - 10 reps - 3 sec  hold - Supine Shoulder Rhythmic Stabilization- Horizontal Abduction/Adduction  - 2 x daily - 7 x weekly - 1 sets - 5-10 reps Patient Education - TENS Unit - Trigger Point Dry Needling  ASSESSMENT:  CLINICAL  IMPRESSION: Patient reports continued improvement in L shoulder with less pain and improving functional activity. Increased reps and resistance for exercises. She has minimal pain and is working on exercises at home. She has responded well to treatment including manual work, dry needling, PROM, stretching, strengthening. She demonstrated gains in AROM L shoulder.  Note persistent tightness in Lt shoulder with palpable tightness through Rt shoulder girdle, deep pec muscles, biceps. Will progress with posterior shoulder girdle and shoulder strengthening as tolerated.   OBJECTIVE IMPAIRMENTS: Lt shoulder pain for the past 10 or more years with increased symptoms in the past year. [name] has poor posture and alignment; limited AROM, decreased strength; decreased functional activity and ADL's using Lt UE; pain; difficulty sleeping due to Lt shoulder pain decreased activity tolerance, decreased mobility, decreased ROM, decreased strength, hypomobility, increased fascial restrictions, impaired flexibility, impaired UE functional use, improper body mechanics, postural dysfunction, and pain.    GOALS: Goals reviewed with patient? Yes  SHORT TERM GOALS: Target date: 04/04/2023   Independent in initial HEP  Baseline: Goal status: met  2.  Improve posture and alignment with patient to demonstrate improved upright posture with posterior shoulder girdle musculature  Baseline:  Goal status: met   LONG TERM GOALS: Target date: 07/02/2023   Patient reports decreased pain Lt shoulder by 50% allowing her to sleep for longer periods of time(3-4 hours) without awaking due to pain Baseline:  Goal status: met   2.  Increase strength Lt shoulder to 3/5 to 4/5  Baseline:  Goal status: on going   3.  Increase AROM Lt shoulder by 5-10 degrees  Baseline:  Goal status: partially met   4.  Patient demonstrates/reports improved functional use of Lt UE for ADL's including light household chores  Baseline:  Goal  status: partially met    5.  Independent in HEP (including aquatic program as indicated) Baseline:  Goal status: on going   6.  Improve functional limitation score to 60  Baseline: 05/07/23 56  Goal status: on going   PLAN:  PT FREQUENCY: 2x/week  PT DURATION: 8 weeks  PLANNED INTERVENTIONS: Therapeutic exercises, Therapeutic activity, Neuromuscular re-education, Patient/Family education, Self Care, Joint mobilization, Aquatic Therapy, Dry Needling, Electrical stimulation, Cryotherapy, Moist heat, Taping, Vasopneumatic device, Ultrasound, Ionotophoresis 4mg /ml Dexamethasone, Manual therapy, and Re-evaluation  PLAN FOR NEXT SESSION: review and progress with exercises and postural correction; manual work, DN, modalities as indicated    [employer], PT 06/03/2023, 2:52 PM

## 2023-06-04 ENCOUNTER — Ambulatory Visit (INDEPENDENT_AMBULATORY_CARE_PROVIDER_SITE_OTHER): Payer: PPO | Admitting: Family Medicine

## 2023-06-04 ENCOUNTER — Other Ambulatory Visit: Payer: Self-pay | Admitting: Family Medicine

## 2023-06-04 ENCOUNTER — Encounter: Payer: Self-pay | Admitting: Family Medicine

## 2023-06-04 VITALS — BP 125/56 | HR 66 | Ht 64.0 in | Wt 189.0 lb

## 2023-06-04 DIAGNOSIS — I480 Paroxysmal atrial fibrillation: Secondary | ICD-10-CM

## 2023-06-04 DIAGNOSIS — D649 Anemia, unspecified: Secondary | ICD-10-CM | POA: Diagnosis not present

## 2023-06-04 DIAGNOSIS — I10 Essential (primary) hypertension: Secondary | ICD-10-CM

## 2023-06-04 DIAGNOSIS — N1831 Chronic kidney disease, stage 3a: Secondary | ICD-10-CM

## 2023-06-04 DIAGNOSIS — M25532 Pain in left wrist: Secondary | ICD-10-CM

## 2023-06-04 DIAGNOSIS — Z7984 Long term (current) use of oral hypoglycemic drugs: Secondary | ICD-10-CM

## 2023-06-04 DIAGNOSIS — E1122 Type 2 diabetes mellitus with diabetic chronic kidney disease: Secondary | ICD-10-CM | POA: Diagnosis not present

## 2023-06-04 DIAGNOSIS — R809 Proteinuria, unspecified: Secondary | ICD-10-CM

## 2023-06-04 DIAGNOSIS — E118 Type 2 diabetes mellitus with unspecified complications: Secondary | ICD-10-CM

## 2023-06-04 DIAGNOSIS — R7989 Other specified abnormal findings of blood chemistry: Secondary | ICD-10-CM | POA: Diagnosis not present

## 2023-06-04 DIAGNOSIS — I25110 Atherosclerotic heart disease of native coronary artery with unstable angina pectoris: Secondary | ICD-10-CM | POA: Diagnosis not present

## 2023-06-04 DIAGNOSIS — E039 Hypothyroidism, unspecified: Secondary | ICD-10-CM

## 2023-06-04 LAB — CBC WITH DIFFERENTIAL/PLATELET
Absolute Monocytes: 893 cells/uL (ref 200–950)
HCT: 35.8 % (ref 35.0–45.0)
Lymphs Abs: 1748 cells/uL (ref 850–3900)
MCH: 29.9 pg (ref 27.0–33.0)
Monocytes Relative: 11.9 %
Total Lymphocyte: 23.3 %

## 2023-06-04 LAB — POCT UA - MICROALBUMIN
Creatinine, POC: 50 mg/dL
Microalbumin Ur, POC: 10 mg/L

## 2023-06-04 LAB — POCT GLYCOSYLATED HEMOGLOBIN (HGB A1C): Hemoglobin A1C: 6.4 % — AB (ref 4.0–5.6)

## 2023-06-04 NOTE — Assessment & Plan Note (Signed)
Pressure looks fantastic.  Repeat pressure was at goal we will continue current regimen.

## 2023-06-04 NOTE — Assessment & Plan Note (Signed)
At goal.  Continue to work on [employer] and regular exercise.

## 2023-06-04 NOTE — Assessment & Plan Note (Signed)
She did bring in a copy including pictures of her recent stress echo.  Medication of decreased flow at the septum but otherwise no sign of acute ischemia which was very reassuring.  Will scan a copy into the chart but I did actually have her take her collar original so she can take it to her cardiology appointment.

## 2023-06-04 NOTE — Addendum Note (Signed)
Addended by: Nani Gasser D on: 06/04/2023 06:02 PM   Modules accepted: Level of Service

## 2023-06-04 NOTE — Progress Notes (Signed)
[facility] Quality Team Note  Name: Tara Campbell Date of Birth: 12-May-1946 MRN: [id number] Date: 06/04/2023  [facility] Quality Team has reviewed this patient's chart, please see recommendations below:  [facility] Quality Other; (PATIENT IS DUE FOR URINE MICROALBUMIN/CREATININE RATIO TEST. PLEASE ADDRESS DURING TODAY'S OFFICE VISIT WITH PCP IS ABLE)

## 2023-06-04 NOTE — Assessment & Plan Note (Signed)
1C looks great today at 6.4.  Continue current regimen.

## 2023-06-04 NOTE — Assessment & Plan Note (Signed)
Did end up holding her Eliquis because of renal function will get a recheck it again today.  Her last serum creatinine here was 1.3.

## 2023-06-04 NOTE — Progress Notes (Addendum)
Established Patient Office Visit  Subjective   Patient ID: Tara Campbell, female    DOB: January 02, 1946  Age: 77 y.o. MRN: [id number]  Chief Complaint  Patient presents with   Hypertension    HPI  [name] recently went on a cruise with her sister and niece.  She said for couple days there was a lot of motion on the ship and so she felt really nauseated she even vomited once.  But then she started to experience some pain between her shoulder blades.  She came became a little bit fearful about her heart because when she had a heart attack in the past the pain initially started with pain between her shoulder blades so she was evaluated by a cardiologist.  She brought in the paperwork in with her today.  It is an [place].  They did do a COVID stress test.  For the most part they did not see anything acute or worrisome.  In general she is feeling better.  That her blood pressure was up initially today and normally it is really well-controlled.  The only thing that they told her to do would be to hold her Eliquis because at the time her renal function was elevated so she has been holding that medication since she came back from her trip.  Hypertension- Pt denies chest pain, SOB, dizziness, or heart palpitations.  Taking meds as directed w/o problems.  Denies medication side effects.    Diabetes - no hypoglycemic events. No wounds or sores that are not healing well. No increased thirst or urination. Checking glucose at home. Taking medications as prescribed without any side effects.  Physical therapy including dry needling that she has been doing for her shoulder has been extremely helpful she has had improvement in pain as well as range of motion.  She says her goal is to avoid surgery if at all possible.  Also has some left wrist pain just above the thumb radiating into her forearm lately she has noticed that if her hand is down and she twists her wrist she will feel a little bit of pain there.  No  known injury or trauma but she was lifting heavy luggage on her trip.  Transfer letter notes no sign of acute heart failure.  Normal troponins.  Hemoglobin was noted to be mildly low at 10.8.  Prior hemoglobin that we have on file for her was 12.7 back in October 2023.  Serum creatinine was elevated at 2.02 with an estimated GFR around 23.  Medications were reviewed and updated.     Current Outpatient Medications:    acetaminophen (TYLENOL) 650 MG CR tablet, Take 1 tablet (650 mg total) by mouth every 8 (eight) hours as needed for pain., Disp: 90 tablet, Rfl: 3   albuterol (VENTOLIN HFA) 108 (90 Base) MCG/ACT inhaler, Inhale 2 puffs into the lungs every 6 (six) hours as needed for wheezing., Disp: 18 g, Rfl: 5   allopurinol (ZYLOPRIM) 300 MG tablet, Take 1 tablet (300 mg total) by mouth 2 (two) times daily. (Patient taking differently: Take 300 mg by mouth daily.), Disp: 180 tablet, Rfl: 1   apixaban (ELIQUIS) 5 MG TABS tablet, Take 1 tablet (5 mg total) by mouth 2 (two) times daily., Disp: 180 tablet, Rfl: 3   atenolol (TENORMIN) 100 MG tablet, Take 1 tablet (100 mg total) by mouth daily., Disp: 90 tablet, Rfl: 1   atorvastatin (LIPITOR) 80 MG tablet, Take 1 tablet (80 mg total) by mouth at bedtime.,  Disp: 90 tablet, Rfl: 3   clopidogrel (PLAVIX) 75 MG tablet, Take 1 tablet (75 mg total) by mouth daily., Disp: 90 tablet, Rfl: 3   cyanocobalamin (VITAMIN B12) 1000 MCG tablet, Take 1,000 mcg by mouth daily., Disp: , Rfl:    empagliflozin (JARDIANCE) 10 MG TABS tablet, Take 1 tablet (10 mg total) by mouth daily before breakfast., Disp: 30 tablet, Rfl: 11   EPINEPHrine 0.3 mg/0.3 mL IJ SOAJ injection, Inject 0.3 mg into the muscle as needed for anaphylaxis. Call 9-1-1 after use., Disp: 1 each, Rfl: 2   Evolocumab with Infusor (REPATHA PUSHTRONEX SYSTEM) 420 MG/3.5ML SOCT, Inject 420 mg into the skin every 30 (thirty) days., Disp: 3.6 mL, Rfl: 11   ezetimibe (ZETIA) 10 MG tablet, Take 1 tablet (10 mg  total) by mouth daily., Disp: 90 tablet, Rfl: 3   fluticasone (FLONASE) 50 MCG/ACT nasal spray, Place 1 spray into both nostrils daily., Disp: 16 g, Rfl: 3   fluticasone furoate-vilanterol (BREO ELLIPTA) 200-25 MCG/ACT AEPB, INHALE 1 PUFF INTO THE lungs DAILY, Disp: 60 each, Rfl: 5   glucose blood test strip, To be used twice daily for testing blood sugars. E11.65, Disp: 200 each, Rfl: 11   ipratropium (ATROVENT) 0.03 % nasal spray, Place 2 sprays into both nostrils every 12 (twelve) hours., Disp: 30 mL, Rfl: 0   ipratropium-albuterol (DUONEB) 0.5-2.5 (3) MG/3ML SOLN, Take 3 mLs by nebulization every 6 (six) hours as needed., Disp: 3 mL, Rfl: PRN   irbesartan (AVAPRO) 300 MG tablet, Take 300 mg by mouth daily., Disp: , Rfl:    isosorbide mononitrate (IMDUR) 60 MG 24 hr tablet, Patient takes 0.5 tablet in the morning by mouth and 1 tablet in the evening., Disp: , Rfl:    levothyroxine (SYNTHROID) 125 MCG tablet, Take 1 tablet (125 mcg total) by mouth daily before breakfast., Disp: 90 tablet, Rfl: 0   Mepolizumab ([name]) 100 MG/ML SOAJ, Inject 1 mL (100 mg total) into the skin every 28 (twenty-eight) days., Disp: 1 mL, Rfl: 4   metFORMIN (GLUCOPHAGE) 500 MG tablet, Take 2 tablets (1,000 mg total) by mouth 2 (two) times daily with a meal., Disp: 180 tablet, Rfl: 3   nitroGLYCERIN (NITROSTAT) 0.4 MG SL tablet, Place 1 tablet (0.4 mg total) under the tongue every 5 (five) minutes as needed for chest pain (or tightness)., Disp: 25 tablet, Rfl: PRN   Omega-3 Fatty Acids (FISH OIL) 1000 MG CAPS, Take 1 capsule (1,000 mg total) by mouth daily., Disp: 90 capsule, Rfl: 3   ondansetron (ZOFRAN-ODT) 8 MG disintegrating tablet, 8mg  ODT q8 hours prn nausea, Disp: 20 tablet, Rfl: 1   Polyvinyl Alcohol-Povidone (REFRESH OP), Place 1 drop into both eyes daily as needed (dry eyes)., Disp: , Rfl:    ranolazine (RANEXA) 500 MG 12 hr tablet, Take 1 tablet (500 mg total) by mouth 2 (two) times daily., Disp: 60 tablet, Rfl:  5   Vitamin D, Cholecalciferol, 25 MCG (1000 UT) TABS, Take 25 mcg by mouth daily in the afternoon., Disp: 60 tablet, Rfl:    zinc gluconate 50 MG tablet, Take 50 mg by mouth at bedtime., Disp: , Rfl:     ROS    Objective:     BP (!) 125/56   Pulse 66   Ht 5\' 4"  (1.626 m)   Wt 189 lb (85.7 kg)   SpO2 99%   BMI 32.44 kg/m    Physical Exam Vitals and nursing note reviewed.  Constitutional:      Appearance:  She is well-developed.  HENT:     Head: Normocephalic and atraumatic.  Cardiovascular:     Rate and Rhythm: Normal rate and regular rhythm.     Heart sounds: Normal heart sounds.  Pulmonary:     Effort: Pulmonary effort is normal.     Breath sounds: Normal breath sounds.  Musculoskeletal:     Comments: Left wrist with normal range of motion and strength.  Negative Finklestein's test.  Skin:    General: Skin is warm and dry.  Neurological:     Mental Status: She is alert and oriented to person, place, and time.  Psychiatric:        Behavior: Behavior normal.      Results for orders placed or performed in visit on 06/04/23  POCT glycosylated hemoglobin (Hb A1C)  Result Value Ref Range   Hemoglobin A1C 6.4 (A) 4.0 - 5.6 %   HbA1c POC (<> result, manual entry)     HbA1c, POC (prediabetic range)     HbA1c, POC (controlled diabetic range)    POCT UA - Microalbumin  Result Value Ref Range   Microalbumin Ur, POC 10 mg/L   Creatinine, POC 50 mg/dL   Albumin/Creatinine Ratio, Urine, POC 30-300       The ASCVD Risk score (Arnett DK, et al., 2019) failed to calculate for the following reasons:   The patient has a prior MI or stroke diagnosis    Assessment & Plan:   Problem List Items Addressed This Visit       Cardiovascular and Mediastinum   Paroxysmal atrial fibrillation (HCC) (Chronic)    Did end up holding her Eliquis because of renal function will get a recheck it again today.  Her last serum creatinine here was 1.3.      Hypertension goal BP (blood  pressure) < 140/90 (Chronic)    Pressure looks fantastic.  Repeat pressure was at goal we will continue current regimen.      Relevant Orders   CBC with Differential/Platelet   COMPLETE METABOLIC PANEL WITH GFR   CAD (coronary artery disease)    She did bring in a copy including pictures of her recent stress echo.  Medication of decreased flow at the septum but otherwise no sign of acute ischemia which was very reassuring.  Will scan a copy into the chart but I did actually have her take her collar original so she can take it to her cardiology appointment.        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  Continue to work on healthy food choices and regular exercise.      Relevant Orders   POCT glycosylated hemoglobin (Hb A1C) (Completed)   POCT UA - Microalbumin (Completed)   CBC with Differential/Platelet   COMPLETE METABOLIC PANEL WITH GFR   Type 2 diabetes mellitus with diabetic chronic kidney disease (HCC)    1C looks great today at 6.4.  Continue current regimen.      Relevant Orders   CBC with Differential/Platelet   COMPLETE METABOLIC PANEL WITH GFR   Other Visit Diagnoses     Elevated serum creatinine       Relevant Orders   CBC with Differential/Platelet   COMPLETE METABOLIC PANEL WITH GFR   Low hemoglobin       Relevant Orders   CBC with Differential/Platelet   COMPLETE METABOLIC PANEL WITH GFR   Left wrist pain  Microalbuminuria           Elevated serum creatinine-had bumped up to 2.0.  It was 1.3 back in May before she left for her trip which was actually a bit of a bump for her.  Organ to recheck it today as she has been holding the Eliquis since discharge.  If renal function is acceptable then okay to restart.  I think she should be able to restart at 5 mg since age 65 and BMI of 32.  Low hemoglobin/anemia-recheck CBC today.  Call today to see if she can try to get in with cardiology  she had called initially but the cardiologist is out of town.  We did make a copy of the documents but I gave her back the originals to take with her.  Some mild albuminuria in the urine.  She is on an ARB.  Wrist pain.  Left -pretty normal and reassuring exam today nontender recommend avoiding heavy lifting or repetitive movements.  Ice as needed.  Okay to use Tylenol.  If not improving over the next several weeks then we can get her in with sports medicine.  Her pain is in the area that most people experience de Quervain's tenosynovitis but she has a negative Finkelstein's on exam today.  No follow-ups on file.    Nani Gasser, MD

## 2023-06-05 ENCOUNTER — Encounter: Payer: PPO | Admitting: Rehabilitative and Restorative Service Providers"

## 2023-06-05 LAB — COMPLETE METABOLIC PANEL WITH GFR
AG Ratio: 1.9 (calc) (ref 1.0–2.5)
ALT: 12 U/L (ref 6–29)
AST: 13 U/L (ref 10–35)
Albumin: 4.3 g/dL (ref 3.6–5.1)
Alkaline phosphatase (APISO): 91 U/L (ref 37–153)
BUN: 17 mg/dL (ref 7–25)
CO2: 26 mmol/L (ref 20–32)
Calcium: 10.3 mg/dL (ref 8.6–10.4)
Chloride: 104 mmol/L (ref 98–110)
Creat: 0.96 mg/dL (ref 0.60–1.00)
Globulin: 2.3 g/dL (calc) (ref 1.9–3.7)
Glucose, Bld: 118 mg/dL — ABNORMAL HIGH (ref 65–99)
Potassium: 4.5 mmol/L (ref 3.5–5.3)
Sodium: 139 mmol/L (ref 135–146)
Total Bilirubin: 0.4 mg/dL (ref 0.2–1.2)
Total Protein: 6.6 g/dL (ref 6.1–8.1)
eGFR: 60 mL/min/{1.73_m2} (ref >=60)

## 2023-06-05 LAB — CBC WITH DIFFERENTIAL/PLATELET
Basophils Absolute: 68 cells/uL (ref 0–200)
Basophils Relative: 0.9 %
Eosinophils Absolute: 150 cells/uL (ref 15–500)
Eosinophils Relative: 2 %
Hemoglobin: 12 g/dL (ref 11.7–15.5)
MCHC: 33.5 g/dL (ref 32.0–36.0)
MCV: 89.1 fL (ref 80.0–100.0)
MPV: 10.5 fL (ref 7.5–12.5)
Neutro Abs: 4643 cells/uL (ref 1500–7800)
Neutrophils Relative %: 61.9 %
Platelets: 300 10*3/uL (ref 140–400)
RBC: 4.02 10*6/uL (ref 3.80–5.10)
RDW: 12.9 % (ref 11.0–15.0)
WBC: 7.5 10*3/uL (ref 3.8–10.8)

## 2023-06-05 NOTE — Progress Notes (Signed)
Hi Tara Campbell, great news!  Kidney functions back down to 0.9 if you Roundup it is right around 1.0.  This is more consistent with your baseline 2 and 6 months ago which is fantastic.  Okay to go ahead and restart your Eliquis at 5 mg twice a day until you see cardiology.  Liver function looks great.  I did go ahead and check your CBC as well and hemoglobin was around 12 which again is more in line with your baseline.  So that is fantastic news!Marland Kitchen

## 2023-06-12 ENCOUNTER — Encounter: Payer: Self-pay | Admitting: Rehabilitative and Restorative Service Providers"

## 2023-06-12 ENCOUNTER — Ambulatory Visit: Payer: PPO | Admitting: Rehabilitative and Restorative Service Providers"

## 2023-06-12 DIAGNOSIS — M25512 Pain in left shoulder: Secondary | ICD-10-CM | POA: Diagnosis not present

## 2023-06-12 DIAGNOSIS — M6281 Muscle weakness (generalized): Secondary | ICD-10-CM

## 2023-06-12 DIAGNOSIS — R29898 Other symptoms and signs involving the musculoskeletal system: Secondary | ICD-10-CM

## 2023-06-12 DIAGNOSIS — G8929 Other chronic pain: Secondary | ICD-10-CM

## 2023-06-12 DIAGNOSIS — R293 Abnormal posture: Secondary | ICD-10-CM

## 2023-06-12 NOTE — Therapy (Signed)
OUTPATIENT PHYSICAL THERAPY SHOULDER TREATMENT  Reporting Period 03/07/23 to 04/08/23  See note below for Objective Data and Assessment of Progress/Goals.     Patient Name: Tara Campbell MRN: [id number] DOB:10-18-1946, 77 y.o., female Today's Date: 06/12/2023  END OF SESSION:  PT End of Session - 06/12/23 0844     Visit Number 17    Number of Visits 25    Date for PT Re-Evaluation 07/02/23    Authorization Type [employer] $10 copay    Progress Note Due on Visit 20    PT Start Time 0844    PT Stop Time 0933    PT Time Calculation (min) 49 min    Activity Tolerance Patient tolerated treatment well             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   Otomycosis of right ear 09/27/2017   Paroxysmal atrial fibrillation (HCC)    Thyroid disease    Trochanteric bursitis of right hip 05/29/2017   Past Surgical History:  Procedure Laterality Date   ANGIOPLASTY     CARDIAC CATHETERIZATION     COLONOSCOPY W/ POLYPECTOMY  01/04/2015   CORONARY STENT INTERVENTION N/A 08/31/2022   Procedure: CORONARY STENT INTERVENTION;  Surgeon: Tonny Bollman, MD;  Location: [facility] INVASIVE CV LAB;  Service: Cardiovascular;  Laterality: N/A;   HEMORRHOID BANDING     LEFT HEART CATH AND CORONARY ANGIOGRAPHY N/A 04/26/2021   Procedure: LEFT HEART CATH AND CORONARY ANGIOGRAPHY;  Surgeon: Tonny Bollman, MD;  Location: [facility] INVASIVE CV LAB;  Service: Cardiovascular;  Laterality: N/A;   LEFT HEART CATH AND CORONARY ANGIOGRAPHY N/A 09/14/2022   Procedure: LEFT HEART CATH AND CORONARY ANGIOGRAPHY;  Surgeon: Tonny Bollman, MD;  Location: [facility] INVASIVE CV LAB;  Service: Cardiovascular;  Laterality:  N/A;   Patient Active Problem List   Diagnosis Date Noted   Coughing 01/17/2023   NSTEMI (non-ST elevated myocardial infarction) (HCC) 09/14/2022   Hypokalemia 09/14/2022   Non-STEMI (non-ST elevated myocardial infarction) (HCC) 09/14/2022   GAD (generalized anxiety disorder) 09/10/2022   Primary osteoarthritis of right knee 08/16/2022   Hypervitaminosis 01/03/2022   Memory impairment 01/03/2022   TIA (transient ischemic attack) 12/26/2021   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 10/02/2021   Obesity (BMI 30-39.9) 09/06/2021   Angina pectoris (HCC) 06/23/2021   Hyperlipidemia 04/27/2021   History of non-ST elevation myocardial infarction (NSTEMI) 04/25/2021   Hilar adenopathy 10/13/2018   Mediastinal adenopathy 10/13/2018   Dupuytren's contracture of left hand 08/28/2018   Trigger finger, left index finger 08/28/2018   Non-seasonal allergic rhinitis 08/26/2018   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   Controlled diabetes mellitus type 2 with complications (HCC) 12/27/2017   Class 1 obesity due to excess calories with serious comorbidity in adult 12/27/2017   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   Encounter for long-term (current) use of medications 11/28/2017   Anticoagulant long-term use - on eliquis for PAF 10/21/2017   Sensorineural hearing loss (SNHL) of both ears 09/27/2017   Hypertension goal BP (blood pressure) < 140/90 08/29/2017  CAD (coronary artery disease) 08/29/2017   Gout of ankle 04/08/2017   History of myocardial infarct at age greater than 60 years 01/23/2017   Mixed diabetic hyperlipidemia associated with type 2 diabetes mellitus (HCC) 01/23/2017   Acquired hypothyroidism 01/23/2017   Severe persistent allergic asthma 01/16/2017   Paroxysmal atrial fibrillation (HCC) 01/16/2017   Cardiomegaly 01/16/2017    PCP: Dr Nani Gasser  REFERRING PROVIDER: Dr Nani Gasser  REFERRING DIAG:  Chronic Lt shoulder pain   THERAPY DIAG:  Chronic left shoulder pain  Other symptoms and signs involving the musculoskeletal system  Muscle weakness (generalized)  Abnormal posture  Rationale for Evaluation and Treatment: Rehabilitation  ONSET DATE: 01/31/22   SUBJECTIVE:                                                                                                                                                                                      SUBJECTIVE STATEMENT: Patient reports that she has some increased "tenderness" in the L shoulder. She was lifting tables and getting ready for a luncheon yesterday.  She did not use the TENS unit - didn't think about it.   Eval:Patient reports that she has had Lt shoulder pain for years. She was diagnosed with degenerative changes in the Lt shoulder and rotator cuff changes ~ 8 years ago. Symptoms have increased in the past year.   Hand dominance: Right  PERTINENT HISTORY: HTN; MI x 2 in 2017; Lt shoulder pain; arthritic hip and knee  PAIN:  Are you having pain? Yes: NPRS scale: 6-7/10 Pain location: Lt shoulder  Pain description: tender  Aggravating factors: worse at night; lifting arm or moving the arm; putting on clothes  Relieving factors: tylenol   PRECAUTIONS: None  WEIGHT BEARING RESTRICTIONS: No  FALLS:  Has patient fallen in last 6 months? No  LIVING ENVIRONMENT: Lives with: lives with their family and lives alone Lives in: House/apartment  OCCUPATION: Retired; sedentary   PATIENT GOALS:decrease Lt shoulder pain and gain strength in Lt arm   NEXT MD VISIT: 06/07/23  OBJECTIVE:   PATIENT SURVEYS:  FOTO 50 goal 60 05/07/23: 56 POSTURE: Patient presents with head forward posture with increased thoracic kyphosis; shoulders rounded and elevated; scapulae abducted and rotated along the thoracic spine; head of the humerus anterior in orientation.   UPPER EXTREMITY ROM:   Active ROM Right eval Left eval Left   04/08/23 Left  05/30/23  Shoulder flexion 139 100 108 112  Shoulder extension 67 37 46 53  Shoulder abduction 138 70 84 109  Shoulder adduction      Shoulder internal rotation Thumb T9 Hand to Lt lateral sacrum Hand to waist  Hand to T 11  Shoulder external rotation 51 8 20 25   Elbow flexion      Elbow extension      Wrist flexion      Wrist extension      Wrist ulnar deviation      Wrist radial deviation      Wrist pronation      Wrist supination      (Blank rows = not tested)  UPPER EXTREMITY MMT: Lt UE strength assessed UE at side; unable to assess in MMT positions   MMT Right eval Left eval Left  05/07/23  Shoulder flexion 4 3- 3  Shoulder extension 5 4 4+  Shoulder abduction 4 2+ 3-  Shoulder adduction     Shoulder internal rotation 5 2+ 3  Shoulder external rotation 4 2+ 3-  Middle trapezius 4 3- 3  Lower trapezius 4 3- 3  Elbow flexion     Elbow extension     Wrist flexion     Wrist extension     Wrist ulnar deviation     Wrist radial deviation     Wrist pronation     Wrist supination     Grip strength (lbs)     (Blank rows = not tested)  JOINT MOBILITY TESTING:  Decreased mobility Lt shoulder  04/08/23: improving joint mobility  PALPATION:  Muscular tightness Lt pecs; biceps; upper trap; leveator; periscapular musculature; deltoid   04/15/23: increased palpable tightness throughout Lt shoulder girdle noted   [facility] Adult PT Treatment:          DATE: 06/12/23 Therapeutic Exercise: Chin tuck w/ noodle 10 sec x 5 Scap squeeze w/noodle 10 sec x 5  L's w/noodle red TB 2-3 sec 10 x 2 W's w/ noodle red 3 sec x 10 no resistance x 10 Isometric shoulder abduction Lt shoulder at wall 3 sec x 10 x 2   Corner stretch lower and mid level 30 sec x 2    Manual Therapy: Soft tissue mobilization Rt anterior shoulder; upper trap Skilled palpation to assess response to DN and manual work  Psychiatrist  Treatment instructions: Expect mild to moderate muscle  soreness. S/S of pneumothorax if dry needled over a lung field, and to seek immediate medical attention should they occur. Patient verbalized understanding of these instructions and education.  Patient Consent Given: Yes Education handout provided: Previously provided Muscles treated: Lt pecs; upper trap; biceps Electrical stimulation performed: Yes Parameters: mAmp current; intensity to pt tolerance  Treatment response/outcome: decreased palpable tightness noted    Neuromuscular re-ed: Postural education and instruction focus on engaging posterior shoulder girdle musculature Modalities: TENS(home)  MH Rt shoulder x 10 min  Self Care: Modifying recliner to improve posture when sitting  Using of pillows to prop phone, tablet, book when sitting   [facility] Adult PT Treatment:          DATE: 06/03/23 Therapeutic Exercise: Chin tuck w/ noodle 10 sec x 5 Scap squeeze w/noodle 10 sec x 5  L's w/noodle red TB 2-3 sec 10 x 2 W's w/ noodle red 3 sec x 10 no resistance x 10 Isometric shoulder abduction Lt shoulder at wall 3 sec x 10 x 2   Corner stretch lower and mid level 30 sec x 2  Isometric row green TB x 5 sec x 10  Isometric shoulder ER green TB x 3 sec x 10  Isometric shoulder IR green TB x 3 sec x 10  Shoulder rolls backward  Theraband  flexbar x ~ 1 min x 2  Ball btn hands for various movements shoulders, elbows, wrists x 10 reps  Ball at side in sitting pressing into ball; rolling ball fwd/back; side to side; circles CW/CCW Wall push up x 5 x 2  Standing ball on table for alphabet  Supine scap squeeze 5 sec x 10  Rhythmic stabilization supine  Manual Therapy: Soft tissue mobilization Rt anterior shoulder; upper trap Skilled palpation to assess response to DN and manual work   Neuromuscular re-ed: Postural education and instruction focus on engaging posterior shoulder girdle musculature Modalities: TENS(home)  MH Rt shoulder x 10 min  Self Care: Modifying recliner to improve  posture when sitting  Using of pillows to prop phone, tablet, book when sitting  [facility] Adult PT Treatment:          DATE: 05/30/23 Therapeutic Exercise: Corner stretch lower and mid positions 15-20 sec x 3  Chin tuck w/ noodle 10 sec x 5 Scap squeeze w/noodle 10 sec x 5  L's w/noodle red TB 2-3 sec 10 x 2 W's w/ noodle yellow 3 sec x 10 no resistance x 10 Manual Therapy: Soft tissue mobilization Rt anterior shoulder; upper trap Skilled palpation to assess response to DN and manual work  Research officer, political party lower and mid level 30 sec x 2  Isometric row green TB x 5 sec x 10  Isometric shoulder ER green TB x 3 sec x 10  Isometric shoulder IR green TB x 3 sec x 10  Shoulder rolls backward  Theraband flexbar x ~ 1 min x 2  Ball btn hands for various movements shoulders, elbows, wrists x 10 reps  Ball at side in sitting pressing into ball; rolling ball fwd/back; side to side; circles CW/CCW Wall push up x 5 x 2  Standing ball on table for alphabet  Supine scap squeeze 5 sec x 10  Rhythmic stabilization supine   Neuromuscular re-ed: Postural education and instruction focus on engaging posterior shoulder girdle musculature Modalities: TENS(home)  MH Rt shoulder x 10 min  Self Care: Modifying recliner to improve posture when sitting  Using of pillows to prop phone, tablet, book when sitting   PATIENT EDUCATION: Education details: POC; HEP Person educated: Patient Education method: Explanation, Demonstration, Tactile cues, Verbal cues, and Handouts Education comprehension: verbalized understanding, returned demonstration, verbal cues required, tactile cues required, and needs further education  HOME EXERCISE PROGRAM:  Access Code: [email or web address] URL: [email or web address] Date: 06/03/2023 Prepared by: Corlis Leak  Exercises - Seated Shoulder Flexion AAROM with Pulley Behind  - 2 x daily - 7 x weekly - 1 sets - 10 reps - 10 sec  hold - Seated Shoulder Scaption AAROM with  Pulley at Side  - 2 x daily - 7 x weekly - 1 sets - 10 reps - 10sec  hold - Seated Cervical Retraction  - 3 x daily - 7 x weekly - 1 sets - 10 reps - 5-10 hold - Standing Scapular Retraction  - 3 x daily - 7 x weekly - 1 sets - 10 reps - 10 hold - Shoulder External Rotation and Scapular Retraction  - 3 x daily - 7 x weekly - 1 sets - 10 reps -   hold - Supine Scapular Retraction  - 2 x daily - 7 x weekly - 1 sets - 10 reps - 5-10 sec  hold - Shoulder External Rotation and Scapular Retraction with Resistance  - 2 x daily - 7 x weekly - 1 sets -  10 reps - 3-5 sec  hold - Isometric Shoulder Extension at Wall  - 2 x daily - 7 x weekly - 1 sets - 5-10 reps - 5 sec  hold - Isometric Shoulder External Rotation at Wall  - 2 x daily - 7 x weekly - 1 sets - 5 reps - 5 sec  hold - Standing Backward Shoulder Rolls  - 2 x daily - 7 x weekly - 1 sets - 10 reps - 1-2 sec  hold - Corner Pec Major Stretch  - 2 x daily - 7 x weekly - 1 sets - 3 reps - 30 sec  hold - Standing Shoulder Row Reactive Isometric  - 1 x daily - 7 x weekly - 1-2 sets - 10 reps - 5 sec  hold - External Rotation Reactive Isometrics with Flex Bar  - 1 x daily - 7 x weekly - 1-2 sets - 10 reps - 5 sec  hold - Shoulder Internal Rotation Reactive Isometrics  - 1 x daily - 7 x weekly - 1-2 sets - 10 reps - 5 sec  hold - Standing shoulder flexion wall slides  - 2 x daily - 7 x weekly - 1 sets - 5-10 reps - 5 sec  hold - Wall Push Up  - 1 x daily - 7 x weekly - 1-3 sets - 10 reps - 3 sec  hold - Supine Shoulder Rhythmic Stabilization- Horizontal Abduction/Adduction  - 2 x daily - 7 x weekly - 1 sets - 5-10 reps Patient Education - TENS Unit - Trigger Point Dry Needling  ASSESSMENT:  CLINICAL IMPRESSION: Patient reports significant L shoulder pain from activities over the past couple of days. Shoulder pain is increased and she has decreased activity. Decreased exercises and repeated manual work and DN. She has responded well to treatment  including manual work, dry needling, PROM, stretching, strengthening.  Noted persistent tightness in Lt shoulder with palpable tightness through Rt shoulder girdle, deep pec muscles, biceps. Will progress with posterior shoulder girdle and shoulder strengthening as tolerated.   OBJECTIVE IMPAIRMENTS: Lt shoulder pain for the past 10 or more years with increased symptoms in the past year. [name] has poor posture and alignment; limited AROM, decreased strength; decreased functional activity and ADL's using Lt UE; pain; difficulty sleeping due to Lt shoulder pain decreased activity tolerance, decreased mobility, decreased ROM, decreased strength, hypomobility, increased fascial restrictions, impaired flexibility, impaired UE functional use, improper body mechanics, postural dysfunction, and pain.    GOALS: Goals reviewed with patient? Yes  SHORT TERM GOALS: Target date: 04/04/2023   Independent in initial HEP  Baseline: Goal status: met  2.  Improve posture and alignment with patient to demonstrate improved upright posture with posterior shoulder girdle musculature  Baseline:  Goal status: met   LONG TERM GOALS: Target date: 07/02/2023   Patient reports decreased pain Lt shoulder by 50% allowing her to sleep for longer periods of time(3-4 hours) without awaking due to pain Baseline:  Goal status: met   2.  Increase strength Lt shoulder to 3/5 to 4/5  Baseline:  Goal status: on going   3.  Increase AROM Lt shoulder by 5-10 degrees  Baseline:  Goal status: partially met   4.  Patient demonstrates/reports improved functional use of Lt UE for ADL's including light household chores  Baseline:  Goal status: partially met    5.  Independent in HEP (including aquatic program as indicated) Baseline:  Goal status: on going   6.  Improve functional limitation score to 60  Baseline: 05/07/23 56  Goal status: on going   PLAN:  PT FREQUENCY: 2x/week  PT DURATION: 8 weeks  PLANNED  INTERVENTIONS: Therapeutic exercises, Therapeutic activity, Neuromuscular re-education, Patient/Family education, Self Care, Joint mobilization, Aquatic Therapy, Dry Needling, Electrical stimulation, Cryotherapy, Moist heat, Taping, Vasopneumatic device, Ultrasound, Ionotophoresis 4mg /ml Dexamethasone, Manual therapy, and Re-evaluation  PLAN FOR NEXT SESSION: review and progress with exercises and postural correction; manual work, DN, modalities as indicated    [employer], PT 06/12/2023, 8:45 AM

## 2023-06-14 ENCOUNTER — Telehealth: Payer: Self-pay | Admitting: Gastroenterology

## 2023-06-14 NOTE — Telephone Encounter (Signed)
Dr. Salena Saner, please review and advise. Thanks

## 2023-06-14 NOTE — Telephone Encounter (Signed)
PT is calling to find out if she can still have a colonoscopy done at her age. Please advise

## 2023-06-16 ENCOUNTER — Encounter (HOSPITAL_COMMUNITY): Payer: Self-pay | Admitting: Cardiology

## 2023-06-17 ENCOUNTER — Ambulatory Visit: Payer: PPO | Admitting: Rehabilitative and Restorative Service Providers"

## 2023-06-17 ENCOUNTER — Encounter: Payer: Self-pay | Admitting: Rehabilitative and Restorative Service Providers"

## 2023-06-17 DIAGNOSIS — R293 Abnormal posture: Secondary | ICD-10-CM

## 2023-06-17 DIAGNOSIS — M25512 Pain in left shoulder: Secondary | ICD-10-CM | POA: Diagnosis not present

## 2023-06-17 DIAGNOSIS — G8929 Other chronic pain: Secondary | ICD-10-CM

## 2023-06-17 DIAGNOSIS — M6281 Muscle weakness (generalized): Secondary | ICD-10-CM

## 2023-06-17 DIAGNOSIS — R29898 Other symptoms and signs involving the musculoskeletal system: Secondary | ICD-10-CM

## 2023-06-17 NOTE — Therapy (Signed)
OUTPATIENT PHYSICAL THERAPY SHOULDER TREATMENT  Reporting Period 03/07/23 to 04/08/23  See note below for Objective Data and Assessment of Progress/Goals.     Patient Name: Tara Campbell MRN: [id number] DOB:05-08-1946, 77 y.o., female Today's Date: 06/17/2023  END OF SESSION:  PT End of Session - 06/17/23 0842     Visit Number 18    Number of Visits 25    Date for PT Re-Evaluation 07/02/23    Authorization Type [employer] $10 copay    Progress Note Due on Visit 20    PT Start Time 0842    PT Stop Time 0930    PT Time Calculation (min) 48 min    Activity Tolerance Patient tolerated treatment well             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   Otomycosis of right ear 09/27/2017   Paroxysmal atrial fibrillation (HCC)    Thyroid disease    Trochanteric bursitis of right hip 05/29/2017   Past Surgical History:  Procedure Laterality Date   ANGIOPLASTY     CARDIAC CATHETERIZATION     COLONOSCOPY W/ POLYPECTOMY  01/04/2015   CORONARY STENT INTERVENTION N/A 08/31/2022   Procedure: CORONARY STENT INTERVENTION;  Surgeon: Tonny Bollman, MD;  Location: [facility] INVASIVE CV LAB;  Service: Cardiovascular;  Laterality: N/A;   HEMORRHOID BANDING     LEFT HEART CATH AND CORONARY ANGIOGRAPHY N/A 04/26/2021   Procedure: LEFT HEART CATH AND CORONARY ANGIOGRAPHY;  Surgeon: Tonny Bollman, MD;  Location: [facility] INVASIVE CV LAB;  Service: Cardiovascular;  Laterality: N/A;   LEFT HEART CATH AND CORONARY ANGIOGRAPHY N/A 09/14/2022   Procedure: LEFT HEART CATH AND CORONARY ANGIOGRAPHY;  Surgeon: Tonny Bollman, MD;  Location: [facility] INVASIVE CV LAB;  Service: Cardiovascular;  Laterality:  N/A;   Patient Active Problem List   Diagnosis Date Noted   Coughing 01/17/2023   NSTEMI (non-ST elevated myocardial infarction) (HCC) 09/14/2022   Hypokalemia 09/14/2022   Non-STEMI (non-ST elevated myocardial infarction) (HCC) 09/14/2022   GAD (generalized anxiety disorder) 09/10/2022   Primary osteoarthritis of right knee 08/16/2022   Hypervitaminosis 01/03/2022   Memory impairment 01/03/2022   TIA (transient ischemic attack) 12/26/2021   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 10/02/2021   Obesity (BMI 30-39.9) 09/06/2021   Angina pectoris (HCC) 06/23/2021   Hyperlipidemia 04/27/2021   History of non-ST elevation myocardial infarction (NSTEMI) 04/25/2021   Hilar adenopathy 10/13/2018   Mediastinal adenopathy 10/13/2018   Dupuytren's contracture of left hand 08/28/2018   Trigger finger, left index finger 08/28/2018   Non-seasonal allergic rhinitis 08/26/2018   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   Controlled diabetes mellitus type 2 with complications (HCC) 12/27/2017   Class 1 obesity due to excess calories with serious comorbidity in adult 12/27/2017   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   Encounter for long-term (current) use of medications 11/28/2017   Anticoagulant long-term use - on eliquis for PAF 10/21/2017   Sensorineural hearing loss (SNHL) of both ears 09/27/2017   Hypertension goal BP (blood pressure) < 140/90 08/29/2017  CAD (coronary artery disease) 08/29/2017   Gout of ankle 04/08/2017   History of myocardial infarct at age greater than 60 years 01/23/2017   Mixed diabetic hyperlipidemia associated with type 2 diabetes mellitus (HCC) 01/23/2017   Acquired hypothyroidism 01/23/2017   Severe persistent allergic asthma 01/16/2017   Paroxysmal atrial fibrillation (HCC) 01/16/2017   Cardiomegaly 01/16/2017    PCP: Dr Nani Gasser  REFERRING PROVIDER: Dr Nani Gasser  REFERRING DIAG:  Chronic Lt shoulder pain   THERAPY DIAG:  Chronic left shoulder pain  Other symptoms and signs involving the musculoskeletal system  Muscle weakness (generalized)  Abnormal posture  Rationale for Evaluation and Treatment: Rehabilitation  ONSET DATE: 01/31/22   SUBJECTIVE:                                                                                                                                                                                      SUBJECTIVE STATEMENT: Patient reports that she her shoulder is better. She is working on her exercises at home.    Eval:Patient reports that she has had Lt shoulder pain for years. She was diagnosed with degenerative changes in the Lt shoulder and rotator cuff changes ~ 8 years ago. Symptoms have increased in the past year.   Hand dominance: Right  PERTINENT HISTORY: HTN; MI x 2 in 2017; Lt shoulder pain; arthritic hip and knee  PAIN:  Are you having pain? Yes: NPRS scale: 1-2/10 Pain location: Lt shoulder  Pain description: tender  Aggravating factors: worse at night; lifting arm or moving the arm; putting on clothes  Relieving factors: tylenol   PRECAUTIONS: None  WEIGHT BEARING RESTRICTIONS: No  FALLS:  Has patient fallen in last 6 months? No  LIVING ENVIRONMENT: Lives with: lives with their family and lives alone Lives in: House/apartment  OCCUPATION: Retired; sedentary   PATIENT GOALS:decrease Lt shoulder pain and gain strength in Lt arm   NEXT MD VISIT: 06/07/23  OBJECTIVE:   PATIENT SURVEYS:  FOTO 50 goal 60 05/07/23: 56 POSTURE: Patient presents with head forward posture with increased thoracic kyphosis; shoulders rounded and elevated; scapulae abducted and rotated along the thoracic spine; head of the humerus anterior in orientation.   UPPER EXTREMITY ROM:   Active ROM Right eval Left eval Left  04/08/23 Left  05/30/23  Shoulder flexion 139 100 108 112  Shoulder extension 67 37 46 53  Shoulder  abduction 138 70 84 109  Shoulder adduction      Shoulder internal rotation Thumb T9 Hand to Lt lateral sacrum Hand to waist  Hand to T 11  Shoulder external rotation 51 8 20 25   Elbow flexion  Elbow extension      Wrist flexion      Wrist extension      Wrist ulnar deviation      Wrist radial deviation      Wrist pronation      Wrist supination      (Blank rows = not tested)  UPPER EXTREMITY MMT: Lt UE strength assessed UE at side; unable to assess in MMT positions   MMT Right eval Left eval Left  05/07/23  Shoulder flexion 4 3- 3  Shoulder extension 5 4 4+  Shoulder abduction 4 2+ 3-  Shoulder adduction     Shoulder internal rotation 5 2+ 3  Shoulder external rotation 4 2+ 3-  Middle trapezius 4 3- 3  Lower trapezius 4 3- 3  Elbow flexion     Elbow extension     Wrist flexion     Wrist extension     Wrist ulnar deviation     Wrist radial deviation     Wrist pronation     Wrist supination     Grip strength (lbs)     (Blank rows = not tested)  JOINT MOBILITY TESTING:  Decreased mobility Lt shoulder  04/08/23: improving joint mobility  PALPATION:  Muscular tightness Lt pecs; biceps; upper trap; leveator; periscapular musculature; deltoid   04/15/23: increased palpable tightness throughout Lt shoulder girdle noted   [facility] Adult PT Treatment:          DATE: 06/17/23 Therapeutic Exercise: Chin tuck w/ noodle 10 sec x 5 Scap squeeze w/noodle 10 sec x 5  Row blue TB step back 3 sec x 10  Row blue TB 3 sec x 10  ER green TB step out isometric 3 sec x 10  ER red TB 3 sec x 10  IR green TB step in 3 sec x 10  IR red TB 3 sec x 10  Shoulder adduction green TB 3 sec x 10  L's w/noodle red TB 2-3 sec 10 x 2 W's w/ noodle yellow 3 sec x 10 no resistance x 10 Wall push ups x 10  Rolling ball on wall into flexion x 10  Isometric shoulder abduction Lt shoulder at wall 3 sec x 10 x 2   Isometric ER at wall 3 sec x 10  Corner stretch lower and mid level 30 sec x 2  ER  seated forearm resting on table 3 sec x 10   Manual Therapy: Soft tissue mobilization Rt anterior shoulder; upper trap  Neuromuscular re-ed: Postural education and instruction focus on engaging posterior shoulder girdle musculature Modalities: TENS(home)  MH Rt shoulder x 10 min  Self Care: Modifying recliner to improve posture when sitting  Using of pillows to prop phone, tablet, book when sitting  [facility] Adult PT Treatment:          DATE: 06/12/23 Therapeutic Exercise: Chin tuck w/ noodle 10 sec x 5 Scap squeeze w/noodle 10 sec x 5  L's w/noodle red TB 2-3 sec 10 x 2 W's w/ noodle red 3 sec x 10 no resistance x 10 Isometric shoulder abduction Lt shoulder at wall 3 sec x 10 x 2   Corner stretch lower and mid level 30 sec x 2    Manual Therapy: Soft tissue mobilization Rt anterior shoulder; upper trap Skilled palpation to assess response to DN and manual work  Psychiatrist  Treatment instructions: Expect mild to moderate muscle soreness. S/S of pneumothorax if dry needled over a lung field, and to  seek immediate medical attention should they occur. Patient verbalized understanding of these instructions and education.  Patient Consent Given: Yes Education handout provided: Previously provided Muscles treated: Lt pecs; upper trap; biceps Electrical stimulation performed: Yes Parameters: mAmp current; intensity to pt tolerance  Treatment response/outcome: decreased palpable tightness noted    Neuromuscular re-ed: Postural education and instruction focus on engaging posterior shoulder girdle musculature Modalities: TENS(home)  MH Rt shoulder x 10 min  Self Care: Modifying recliner to improve posture when sitting  Using of pillows to prop phone, tablet, book when sitting   [facility] Adult PT Treatment:          DATE: 06/03/23 Therapeutic Exercise: Chin tuck w/ noodle 10 sec x 5 Scap squeeze w/noodle 10 sec x 5  L's w/noodle red TB 2-3 sec 10 x 2 W's w/ noodle red 3  sec x 10 no resistance x 10 Isometric shoulder abduction Lt shoulder at wall 3 sec x 10 x 2   Corner stretch lower and mid level 30 sec x 2  Isometric row green TB x 5 sec x 10  Isometric shoulder ER green TB x 3 sec x 10  Isometric shoulder IR green TB x 3 sec x 10  Shoulder rolls backward  Theraband flexbar x ~ 1 min x 2  Ball btn hands for various movements shoulders, elbows, wrists x 10 reps  Ball at side in sitting pressing into ball; rolling ball fwd/back; side to side; circles CW/CCW Wall push up x 5 x 2  Standing ball on table for alphabet  Supine scap squeeze 5 sec x 10  Rhythmic stabilization supine  Manual Therapy: Soft tissue mobilization Rt anterior shoulder; upper trap Skilled palpation to assess response to DN and manual work   Neuromuscular re-ed: Postural education and instruction focus on engaging posterior shoulder girdle musculature Modalities: TENS(home)  MH Rt shoulder x 10 min  Self Care: Modifying recliner to improve posture when sitting  Using of pillows to prop phone, tablet, book when sitting   PATIENT EDUCATION: Education details: POC; HEP Person educated: Patient Education method: Explanation, Demonstration, Tactile cues, Verbal cues, and Handouts Education comprehension: verbalized understanding, returned demonstration, verbal cues required, tactile cues required, and needs further education  HOME EXERCISE PROGRAM:  Access Code: [email or web address] URL: [email or web address] Date: 06/17/2023 Prepared by: Corlis Leak  Exercises - Seated Shoulder Flexion AAROM with Pulley Behind  - 2 x daily - 7 x weekly - 1 sets - 10 reps - 10 sec  hold - Seated Shoulder Scaption AAROM with Pulley at Side  - 2 x daily - 7 x weekly - 1 sets - 10 reps - 10sec  hold - Seated Cervical Retraction  - 3 x daily - 7 x weekly - 1 sets - 10 reps - 5-10 hold - Standing Scapular Retraction  - 3 x daily - 7 x weekly - 1 sets - 10 reps - 10 hold - Shoulder External  Rotation and Scapular Retraction  - 3 x daily - 7 x weekly - 1 sets - 10 reps -   hold - Supine Scapular Retraction  - 2 x daily - 7 x weekly - 1 sets - 10 reps - 5-10 sec  hold - Shoulder External Rotation and Scapular Retraction with Resistance  - 2 x daily - 7 x weekly - 1 sets - 10 reps - 3-5 sec  hold - Isometric Shoulder Extension at Wall  - 2 x daily - 7 x weekly - 1 sets - 5-10  reps - 5 sec  hold - Isometric Shoulder External Rotation at Wall  - 2 x daily - 7 x weekly - 1 sets - 5 reps - 5 sec  hold - Standing Backward Shoulder Rolls  - 2 x daily - 7 x weekly - 1 sets - 10 reps - 1-2 sec  hold - Corner Pec Major Stretch  - 2 x daily - 7 x weekly - 1 sets - 3 reps - 30 sec  hold - Standing Shoulder Row Reactive Isometric  - 1 x daily - 7 x weekly - 1-2 sets - 10 reps - 5 sec  hold - External Rotation Reactive Isometrics with Flex Bar  - 1 x daily - 7 x weekly - 1-2 sets - 10 reps - 5 sec  hold - Shoulder Internal Rotation Reactive Isometrics  - 1 x daily - 7 x weekly - 1-2 sets - 10 reps - 5 sec  hold - Standing shoulder flexion wall slides  - 2 x daily - 7 x weekly - 1 sets - 5-10 reps - 5 sec  hold - Wall Push Up  - 1 x daily - 7 x weekly - 1-3 sets - 10 reps - 3 sec  hold - Supine Shoulder Rhythmic Stabilization- Horizontal Abduction/Adduction  - 2 x daily - 7 x weekly - 1 sets - 5-10 reps - Seated Shoulder External Rotation in Abduction Supported with Dumbbell  - 1 x daily - 7 x weekly - 1 sets - 10 reps - 3 sec  hold Patient Education - TENS Unit - Trigger Point Dry Needling  ASSESSMENT:  CLINICAL IMPRESSION: Patient reports good improvement in L shoulder pain. Resumed exercises to improve stability and strength in L UE. She has responded well to treatment including manual work, dry needling, PROM, stretching, strengthening.  Noted persistent tightness in Lt shoulder with palpable tightness through Rt shoulder girdle, deep pec muscles, biceps. Will progress with posterior shoulder  girdle and shoulder strengthening as tolerated.   OBJECTIVE IMPAIRMENTS: Lt shoulder pain for the past 10 or more years with increased symptoms in the past year. [name] has poor posture and alignment; limited AROM, decreased strength; decreased functional activity and ADL's using Lt UE; pain; difficulty sleeping due to Lt shoulder pain decreased activity tolerance, decreased mobility, decreased ROM, decreased strength, hypomobility, increased fascial restrictions, impaired flexibility, impaired UE functional use, improper body mechanics, postural dysfunction, and pain.    GOALS: Goals reviewed with patient? Yes  SHORT TERM GOALS: Target date: 04/04/2023   Independent in initial HEP  Baseline: Goal status: met  2.  Improve posture and alignment with patient to demonstrate improved upright posture with posterior shoulder girdle musculature  Baseline:  Goal status: met   LONG TERM GOALS: Target date: 07/02/2023   Patient reports decreased pain Lt shoulder by 50% allowing her to sleep for longer periods of time(3-4 hours) without awaking due to pain Baseline:  Goal status: met   2.  Increase strength Lt shoulder to 3/5 to 4/5  Baseline:  Goal status: on going   3.  Increase AROM Lt shoulder by 5-10 degrees  Baseline:  Goal status: partially met   4.  Patient demonstrates/reports improved functional use of Lt UE for ADL's including light household chores  Baseline:  Goal status: partially met    5.  Independent in HEP (including aquatic program as indicated) Baseline:  Goal status: on going   6.  Improve functional limitation score to 60  Baseline: 05/07/23 56  Goal status: on going   PLAN:  PT FREQUENCY: 2x/week  PT DURATION: 8 weeks  PLANNED INTERVENTIONS: Therapeutic exercises, Therapeutic activity, Neuromuscular re-education, Patient/Family education, Self Care, Joint mobilization, Aquatic Therapy, Dry Needling, Electrical stimulation, Cryotherapy, Moist heat, Taping,  Vasopneumatic device, Ultrasound, Ionotophoresis 4mg /ml Dexamethasone, Manual therapy, and Re-evaluation  PLAN FOR NEXT SESSION: review and progress with exercises and postural correction; manual work, DN, modalities as indicated    [employer], PT 06/17/2023, 8:43 AM

## 2023-06-19 ENCOUNTER — Encounter: Payer: Self-pay | Admitting: Rehabilitative and Restorative Service Providers"

## 2023-06-19 ENCOUNTER — Ambulatory Visit: Payer: PPO | Admitting: Rehabilitative and Restorative Service Providers"

## 2023-06-19 DIAGNOSIS — M6281 Muscle weakness (generalized): Secondary | ICD-10-CM

## 2023-06-19 DIAGNOSIS — R293 Abnormal posture: Secondary | ICD-10-CM

## 2023-06-19 DIAGNOSIS — M25512 Pain in left shoulder: Secondary | ICD-10-CM | POA: Diagnosis not present

## 2023-06-19 DIAGNOSIS — G8929 Other chronic pain: Secondary | ICD-10-CM

## 2023-06-19 DIAGNOSIS — R29898 Other symptoms and signs involving the musculoskeletal system: Secondary | ICD-10-CM

## 2023-06-19 NOTE — Therapy (Signed)
OUTPATIENT PHYSICAL THERAPY SHOULDER TREATMENT  Reporting Period 03/07/23 to 04/08/23  See note below for Objective Data and Assessment of Progress/Goals.     Patient Name: Tara Campbell MRN: [id number] DOB:January 23, 1946, 77 y.o., female Today's Date: 06/19/2023  END OF SESSION:  PT End of Session - 06/19/23 0844     Visit Number 19    Number of Visits 25    Date for PT Re-Evaluation 07/02/23    Authorization Type [employer] $10 copay    Progress Note Due on Visit 20    PT Start Time 0843    PT Stop Time 0932    PT Time Calculation (min) 49 min    Activity Tolerance Patient tolerated treatment well             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   Otomycosis of right ear 09/27/2017   Paroxysmal atrial fibrillation (HCC)    Thyroid disease    Trochanteric bursitis of right hip 05/29/2017   Past Surgical History:  Procedure Laterality Date   ANGIOPLASTY     CARDIAC CATHETERIZATION     COLONOSCOPY W/ POLYPECTOMY  01/04/2015   CORONARY STENT INTERVENTION N/A 08/31/2022   Procedure: CORONARY STENT INTERVENTION;  Surgeon: Tonny Bollman, MD;  Location: [facility] INVASIVE CV LAB;  Service: Cardiovascular;  Laterality: N/A;   HEMORRHOID BANDING     LEFT HEART CATH AND CORONARY ANGIOGRAPHY N/A 04/26/2021   Procedure: LEFT HEART CATH AND CORONARY ANGIOGRAPHY;  Surgeon: Tonny Bollman, MD;  Location: [facility] INVASIVE CV LAB;  Service: Cardiovascular;  Laterality: N/A;   LEFT HEART CATH AND CORONARY ANGIOGRAPHY N/A 09/14/2022   Procedure: LEFT HEART CATH AND CORONARY ANGIOGRAPHY;  Surgeon: Tonny Bollman, MD;  Location: [facility] INVASIVE CV LAB;  Service: Cardiovascular;  Laterality:  N/A;   Patient Active Problem List   Diagnosis Date Noted   Coughing 01/17/2023   NSTEMI (non-ST elevated myocardial infarction) (HCC) 09/14/2022   Hypokalemia 09/14/2022   Non-STEMI (non-ST elevated myocardial infarction) (HCC) 09/14/2022   GAD (generalized anxiety disorder) 09/10/2022   Primary osteoarthritis of right knee 08/16/2022   Hypervitaminosis 01/03/2022   Memory impairment 01/03/2022   TIA (transient ischemic attack) 12/26/2021   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 10/02/2021   Obesity (BMI 30-39.9) 09/06/2021   Angina pectoris (HCC) 06/23/2021   Hyperlipidemia 04/27/2021   History of non-ST elevation myocardial infarction (NSTEMI) 04/25/2021   Hilar adenopathy 10/13/2018   Mediastinal adenopathy 10/13/2018   Dupuytren's contracture of left hand 08/28/2018   Trigger finger, left index finger 08/28/2018   Non-seasonal allergic rhinitis 08/26/2018   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   Controlled diabetes mellitus type 2 with complications (HCC) 12/27/2017   Class 1 obesity due to excess calories with serious comorbidity in adult 12/27/2017   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   Encounter for long-term (current) use of medications 11/28/2017   Anticoagulant long-term use - on eliquis for PAF 10/21/2017   Sensorineural hearing loss (SNHL) of both ears 09/27/2017   Hypertension goal BP (blood pressure) < 140/90 08/29/2017  CAD (coronary artery disease) 08/29/2017   Gout of ankle 04/08/2017   History of myocardial infarct at age greater than 60 years 01/23/2017   Mixed diabetic hyperlipidemia associated with type 2 diabetes mellitus (HCC) 01/23/2017   Acquired hypothyroidism 01/23/2017   Severe persistent allergic asthma 01/16/2017   Paroxysmal atrial fibrillation (HCC) 01/16/2017   Cardiomegaly 01/16/2017    PCP: Dr Nani Gasser  REFERRING PROVIDER: Dr Nani Gasser  REFERRING DIAG:  Chronic Lt shoulder pain   THERAPY DIAG:  Chronic left shoulder pain  Other symptoms and signs involving the musculoskeletal system  Muscle weakness (generalized)  Abnormal posture  Rationale for Evaluation and Treatment: Rehabilitation  ONSET DATE: 01/31/22   SUBJECTIVE:                                                                                                                                                                                      SUBJECTIVE STATEMENT: Patient reports that she her shoulder is feeling better. She is working on her exercises at home.    Eval:Patient reports that she has had Lt shoulder pain for years. She was diagnosed with degenerative changes in the Lt shoulder and rotator cuff changes ~ 8 years ago. Symptoms have increased in the past year.   Hand dominance: Right  PERTINENT HISTORY: HTN; MI x 2 in 2017; Lt shoulder pain; arthritic hip and knee  PAIN:  Are you having pain? Yes: NPRS scale: 2-3/10 Pain location: Lt shoulder  Pain description: tender  Aggravating factors: worse at night; lifting arm or moving the arm; putting on clothes  Relieving factors: tylenol   PRECAUTIONS: None  WEIGHT BEARING RESTRICTIONS: No  FALLS:  Has patient fallen in last 6 months? No  LIVING ENVIRONMENT: Lives with: lives with their family and lives alone Lives in: House/apartment  OCCUPATION: Retired; sedentary   PATIENT GOALS:decrease Lt shoulder pain and gain strength in Lt arm   NEXT MD VISIT: 06/07/23  OBJECTIVE:   PATIENT SURVEYS:  FOTO 50 goal 60 05/07/23: 56 POSTURE: Patient presents with head forward posture with increased thoracic kyphosis; shoulders rounded and elevated; scapulae abducted and rotated along the thoracic spine; head of the humerus anterior in orientation.   UPPER EXTREMITY ROM:   Active ROM Right eval Left eval Left  04/08/23 Left  05/30/23  Shoulder flexion 139 100 108 112  Shoulder extension 67 37 46 53  Shoulder  abduction 138 70 84 109  Shoulder adduction      Shoulder internal rotation Thumb T9 Hand to Lt lateral sacrum Hand to waist  Hand to T 11  Shoulder external rotation 51 8 20 25   Elbow flexion  Elbow extension      Wrist flexion      Wrist extension      Wrist ulnar deviation      Wrist radial deviation      Wrist pronation      Wrist supination      (Blank rows = not tested)  UPPER EXTREMITY MMT: Lt UE strength assessed UE at side; unable to assess in MMT positions   MMT Right eval Left eval Left  05/07/23  Shoulder flexion 4 3- 3  Shoulder extension 5 4 4+  Shoulder abduction 4 2+ 3-  Shoulder adduction     Shoulder internal rotation 5 2+ 3  Shoulder external rotation 4 2+ 3-  Middle trapezius 4 3- 3  Lower trapezius 4 3- 3  Elbow flexion     Elbow extension     Wrist flexion     Wrist extension     Wrist ulnar deviation     Wrist radial deviation     Wrist pronation     Wrist supination     Grip strength (lbs)     (Blank rows = not tested)  JOINT MOBILITY TESTING:  Decreased mobility Lt shoulder  04/08/23: improving joint mobility  PALPATION:  Muscular tightness Lt pecs; biceps; upper trap; leveator; periscapular musculature; deltoid   04/15/23: increased palpable tightness throughout Lt shoulder girdle noted   [facility] Adult PT Treatment:          DATE: 06/19/23 Therapeutic Exercise: Chin tuck w/ noodle 10 sec x 5 Scap squeeze w/noodle 10 sec x 5  Row blue TB step back 3 sec x 10 x 2 Row blue TB 3 sec x 10 x 2 ER green TB step out isometric 3 sec x 10  ER red TB 3 sec x 10  IR green TB step in 3 sec x 10  IR red TB 3 sec x 10  Shoulder adduction green TB 3 sec x 10  L's w/noodle red TB 2-3 sec 10 x 2 W's w/ noodle yellow 3 sec x 10 x 2 Wall push ups x 10  Rolling ball on wall into flexion x 10  Isometric shoulder abduction L shoulder at wall 3 sec x 10 x 2   Isometric ER at wall 3 sec x 10 x 2 Corner stretch lower and mid level 30 sec x 2  ER seated  forearm resting on table 3 sec x 10 bringing arm more into abduction 3 sec x 10  Bouncing ball on wall x 50  Nustep L5 x 5 min 2 min with UE level 10   Manual Therapy: Soft tissue mobilization L anterior shoulder; upper trap  Neuromuscular re-ed: Postural education and instruction focus on engaging posterior shoulder girdle musculature Modalities: TENS(home)  Ice L shoulder x 10 min  Self Care: Modifying recliner to improve posture when sitting  Using of pillows to prop phone, tablet, book when sitting  [facility] Adult PT Treatment:          DATE: 06/17/23 Therapeutic Exercise: Chin tuck w/ noodle 10 sec x 5 Scap squeeze w/noodle 10 sec x 5  Row blue TB step back 3 sec x 10  Row blue TB 3 sec x 10  ER green TB step out isometric 3 sec x 10  ER red TB 3 sec x 10  IR green TB step in 3 sec x 10  IR red TB 3 sec x 10  Shoulder adduction green TB 3 sec x 10  L's  w/noodle red TB 2-3 sec 10 x 2 W's w/ noodle yellow 3 sec x 10 no resistance x 10 Wall push ups x 10  Rolling ball on wall into flexion x 10  Isometric shoulder abduction Lt shoulder at wall 3 sec x 10 x 2   Isometric ER at wall 3 sec x 10  Corner stretch lower and mid level 30 sec x 2  ER seated forearm resting on table 3 sec x 10   Manual Therapy: Soft tissue mobilization L anterior shoulder; upper trap  Neuromuscular re-ed: Postural education and instruction focus on engaging posterior shoulder girdle musculature Modalities: TENS(home)  MH Rt shoulder x 10 min  Self Care: Modifying recliner to improve posture when sitting  Using of pillows to prop phone, tablet, book when sitting  [facility] Adult PT Treatment:          DATE: 06/12/23 Therapeutic Exercise: Chin tuck w/ noodle 10 sec x 5 Scap squeeze w/noodle 10 sec x 5  L's w/noodle red TB 2-3 sec 10 x 2 W's w/ noodle red 3 sec x 10 no resistance x 10 Isometric shoulder abduction Lt shoulder at wall 3 sec x 10 x 2   Corner stretch lower and mid level 30 sec x 2     Manual Therapy: Soft tissue mobilization Rt anterior shoulder; upper trap Skilled palpation to assess response to DN and manual work  Psychiatrist  Treatment instructions: Expect mild to moderate muscle soreness. S/S of pneumothorax if dry needled over a lung field, and to seek immediate medical attention should they occur. Patient verbalized understanding of these instructions and education.  Patient Consent Given: Yes Education handout provided: Previously provided Muscles treated: Lt pecs; upper trap; biceps Electrical stimulation performed: Yes Parameters: mAmp current; intensity to pt tolerance  Treatment response/outcome: decreased palpable tightness noted    Neuromuscular re-ed: Postural education and instruction focus on engaging posterior shoulder girdle musculature Modalities: TENS(home)  MH Rt shoulder x 10 min  Self Care: Modifying recliner to improve posture when sitting  Using of pillows to prop phone, tablet, book when sitting    PATIENT EDUCATION: Education details: POC; HEP Person educated: Patient Education method: Explanation, Demonstration, Tactile cues, Verbal cues, and Handouts Education comprehension: verbalized understanding, returned demonstration, verbal cues required, tactile cues required, and needs further education  HOME EXERCISE PROGRAM:  Access Code: [email or web address] URL: [email or web address] Date: 06/17/2023 Prepared by: Corlis Leak  Exercises - Seated Shoulder Flexion AAROM with Pulley Behind  - 2 x daily - 7 x weekly - 1 sets - 10 reps - 10 sec  hold - Seated Shoulder Scaption AAROM with Pulley at Side  - 2 x daily - 7 x weekly - 1 sets - 10 reps - 10sec  hold - Seated Cervical Retraction  - 3 x daily - 7 x weekly - 1 sets - 10 reps - 5-10 hold - Standing Scapular Retraction  - 3 x daily - 7 x weekly - 1 sets - 10 reps - 10 hold - Shoulder External Rotation and Scapular Retraction  - 3 x daily - 7 x weekly - 1 sets -  10 reps -   hold - Supine Scapular Retraction  - 2 x daily - 7 x weekly - 1 sets - 10 reps - 5-10 sec  hold - Shoulder External Rotation and Scapular Retraction with Resistance  - 2 x daily - 7 x weekly - 1 sets - 10 reps - 3-5 sec  hold - Isometric Shoulder  Extension at [employer]  - 2 x daily - 7 x weekly - 1 sets - 5-10 reps - 5 sec  hold - Isometric Shoulder External Rotation at Wall  - 2 x daily - 7 x weekly - 1 sets - 5 reps - 5 sec  hold - Standing Backward Shoulder Rolls  - 2 x daily - 7 x weekly - 1 sets - 10 reps - 1-2 sec  hold - Corner Pec Major Stretch  - 2 x daily - 7 x weekly - 1 sets - 3 reps - 30 sec  hold - Standing Shoulder Row Reactive Isometric  - 1 x daily - 7 x weekly - 1-2 sets - 10 reps - 5 sec  hold - External Rotation Reactive Isometrics with Flex Bar  - 1 x daily - 7 x weekly - 1-2 sets - 10 reps - 5 sec  hold - Shoulder Internal Rotation Reactive Isometrics  - 1 x daily - 7 x weekly - 1-2 sets - 10 reps - 5 sec  hold - Standing shoulder flexion wall slides  - 2 x daily - 7 x weekly - 1 sets - 5-10 reps - 5 sec  hold - Wall Push Up  - 1 x daily - 7 x weekly - 1-3 sets - 10 reps - 3 sec  hold - Supine Shoulder Rhythmic Stabilization- Horizontal Abduction/Adduction  - 2 x daily - 7 x weekly - 1 sets - 5-10 reps - Seated Shoulder External Rotation in Abduction Supported with Dumbbell  - 1 x daily - 7 x weekly - 1 sets - 10 reps - 3 sec  hold Patient Education - TENS Unit - Trigger Point Dry Needling  ASSESSMENT:  CLINICAL IMPRESSION: Patient reports continued improvement in L shoulder pain with increasing strength and stability and strength in L UE. She has responded well to treatment including manual work, dry needling, PROM, stretching, strengthening.  Noted persistent tightness in Lt shoulder with palpable tightness through Rt shoulder girdle, deep pec muscles, biceps. Will progress with posterior shoulder girdle and shoulder strengthening as tolerated.   OBJECTIVE  IMPAIRMENTS: Lt shoulder pain for the past 10 or more years with increased symptoms in the past year. [name] has poor posture and alignment; limited AROM, decreased strength; decreased functional activity and ADL's using Lt UE; pain; difficulty sleeping due to Lt shoulder pain decreased activity tolerance, decreased mobility, decreased ROM, decreased strength, hypomobility, increased fascial restrictions, impaired flexibility, impaired UE functional use, improper body mechanics, postural dysfunction, and pain.    GOALS: Goals reviewed with patient? Yes  SHORT TERM GOALS: Target date: 04/04/2023   Independent in initial HEP  Baseline: Goal status: met  2.  Improve posture and alignment with patient to demonstrate improved upright posture with posterior shoulder girdle musculature  Baseline:  Goal status: met   LONG TERM GOALS: Target date: 07/02/2023   Patient reports decreased pain Lt shoulder by 50% allowing her to sleep for longer periods of time(3-4 hours) without awaking due to pain Baseline:  Goal status: met   2.  Increase strength Lt shoulder to 3/5 to 4/5  Baseline:  Goal status: on going   3.  Increase AROM Lt shoulder by 5-10 degrees  Baseline:  Goal status: partially met   4.  Patient demonstrates/reports improved functional use of Lt UE for ADL's including light household chores  Baseline:  Goal status: partially met    5.  Independent in HEP (including aquatic program as indicated) Baseline:  Goal  status: on going   6.  Improve functional limitation score to 60  Baseline: 05/07/23 56  Goal status: on going   PLAN:  PT FREQUENCY: 2x/week  PT DURATION: 8 weeks  PLANNED INTERVENTIONS: Therapeutic exercises, Therapeutic activity, Neuromuscular re-education, Patient/Family education, Self Care, Joint mobilization, Aquatic Therapy, Dry Needling, Electrical stimulation, Cryotherapy, Moist heat, Taping, Vasopneumatic device, Ultrasound, Ionotophoresis 4mg /ml  Dexamethasone, Manual therapy, and Re-evaluation  PLAN FOR NEXT SESSION: review and progress with exercises and postural correction; manual work, DN, modalities as indicated    [employer], PT 06/19/2023, 8:45 AM

## 2023-06-24 NOTE — Telephone Encounter (Signed)
She was last seen in the office on 08/22/2020.  We had discussed repeat colonoscopy for ongoing polyp surveillance at that appointment due to history of adenomatous polyps in 2016.  She had received clearance from the [facility] to hold Eliquis for colonoscopy at that same time, but procedure never completed.  Since that appointment, has had a few hospitalizations and ER evaluations, to include hospital admission in 09/2022 with NSTEMI.  Based on overall health and age, if patient is considering repeat colonoscopy for polyp surveillance and CRC screening, would recommend OV first for preoperative evaluation.  Otherwise, age itself is not a precluding factor, but more so patient's overall health, comorbidities, and patient preference.

## 2023-06-24 NOTE — Telephone Encounter (Signed)
Shonpryl, please see note from Dr. Barron Alvine and relay to patient. Thanks  Based on overall health and age, if patient is considering repeat colonoscopy for polyp surveillance and CRC screening, would recommend OV first for preoperative evaluation.  Otherwise, age itself is not a precluding factor, but more so patient's overall health, comorbidities, and patient preference.

## 2023-06-25 ENCOUNTER — Ambulatory Visit: Payer: PPO | Admitting: Rehabilitative and Restorative Service Providers"

## 2023-06-25 ENCOUNTER — Encounter: Payer: Self-pay | Admitting: Rehabilitative and Restorative Service Providers"

## 2023-06-25 DIAGNOSIS — R293 Abnormal posture: Secondary | ICD-10-CM

## 2023-06-25 DIAGNOSIS — R29898 Other symptoms and signs involving the musculoskeletal system: Secondary | ICD-10-CM

## 2023-06-25 DIAGNOSIS — G8929 Other chronic pain: Secondary | ICD-10-CM

## 2023-06-25 DIAGNOSIS — M25512 Pain in left shoulder: Secondary | ICD-10-CM | POA: Diagnosis not present

## 2023-06-25 DIAGNOSIS — M6281 Muscle weakness (generalized): Secondary | ICD-10-CM

## 2023-06-25 NOTE — Therapy (Signed)
OUTPATIENT PHYSICAL THERAPY SHOULDER TREATMENT  Reporting Period 03/07/23 to 04/08/23  See note below for Objective Data and Assessment of Progress/Goals.     Patient Name: Tara Campbell MRN: [id number] DOB:Dec 27, 1945, 77 y.o., female Today's Date: 06/25/2023  END OF SESSION:  PT End of Session - 06/25/23 0801     Visit Number 20    Number of Visits 25    Date for PT Re-Evaluation 07/02/23    Authorization Type [employer] $10 copay    Progress Note Due on Visit 20    PT Start Time 0800    PT Stop Time 0849    PT Time Calculation (min) 49 min    Activity Tolerance Patient tolerated treatment well             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   Otomycosis of right ear 09/27/2017   Paroxysmal atrial fibrillation (HCC)    Thyroid disease    Trochanteric bursitis of right hip 05/29/2017   Past Surgical History:  Procedure Laterality Date   ANGIOPLASTY     CARDIAC CATHETERIZATION     COLONOSCOPY W/ POLYPECTOMY  01/04/2015   CORONARY STENT INTERVENTION N/A 08/31/2022   Procedure: CORONARY STENT INTERVENTION;  Surgeon: Tonny Bollman, MD;  Location: [facility] INVASIVE CV LAB;  Service: Cardiovascular;  Laterality: N/A;   HEMORRHOID BANDING     LEFT HEART CATH AND CORONARY ANGIOGRAPHY N/A 04/26/2021   Procedure: LEFT HEART CATH AND CORONARY ANGIOGRAPHY;  Surgeon: Tonny Bollman, MD;  Location: [facility] INVASIVE CV LAB;  Service: Cardiovascular;  Laterality: N/A;   LEFT HEART CATH AND CORONARY ANGIOGRAPHY N/A 09/14/2022   Procedure: LEFT HEART CATH AND CORONARY ANGIOGRAPHY;  Surgeon: Tonny Bollman, MD;  Location: [facility] INVASIVE CV LAB;  Service: Cardiovascular;  Laterality:  N/A;   Patient Active Problem List   Diagnosis Date Noted   Coughing 01/17/2023   NSTEMI (non-ST elevated myocardial infarction) (HCC) 09/14/2022   Hypokalemia 09/14/2022   Non-STEMI (non-ST elevated myocardial infarction) (HCC) 09/14/2022   GAD (generalized anxiety disorder) 09/10/2022   Primary osteoarthritis of right knee 08/16/2022   Hypervitaminosis 01/03/2022   Memory impairment 01/03/2022   TIA (transient ischemic attack) 12/26/2021   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 10/02/2021   Obesity (BMI 30-39.9) 09/06/2021   Angina pectoris (HCC) 06/23/2021   Hyperlipidemia 04/27/2021   History of non-ST elevation myocardial infarction (NSTEMI) 04/25/2021   Hilar adenopathy 10/13/2018   Mediastinal adenopathy 10/13/2018   Dupuytren's contracture of left hand 08/28/2018   Trigger finger, left index finger 08/28/2018   Non-seasonal allergic rhinitis 08/26/2018   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   Controlled diabetes mellitus type 2 with complications (HCC) 12/27/2017   Class 1 obesity due to excess calories with serious comorbidity in adult 12/27/2017   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   Encounter for long-term (current) use of medications 11/28/2017   Anticoagulant long-term use - on eliquis for PAF 10/21/2017   Sensorineural hearing loss (SNHL) of both ears 09/27/2017   Hypertension goal BP (blood pressure) < 140/90 08/29/2017  CAD (coronary artery disease) 08/29/2017   Gout of ankle 04/08/2017   History of myocardial infarct at age greater than 60 years 01/23/2017   Mixed diabetic hyperlipidemia associated with type 2 diabetes mellitus (HCC) 01/23/2017   Acquired hypothyroidism 01/23/2017   Severe persistent allergic asthma 01/16/2017   Paroxysmal atrial fibrillation (HCC) 01/16/2017   Cardiomegaly 01/16/2017    PCP: Dr Nani Gasser  REFERRING PROVIDER: Dr Nani Gasser  REFERRING DIAG:  Chronic Lt shoulder pain   THERAPY DIAG:  Chronic left shoulder pain  Other symptoms and signs involving the musculoskeletal system  Muscle weakness (generalized)  Abnormal posture  Rationale for Evaluation and Treatment: Rehabilitation  ONSET DATE: 01/31/22   SUBJECTIVE:                                                                                                                                                                                      SUBJECTIVE STATEMENT: Patient reports that she her shoulder is doing well. She is working on her exercises at home.    Eval:Patient reports that she has had Lt shoulder pain for years. She was diagnosed with degenerative changes in the Lt shoulder and rotator cuff changes ~ 8 years ago. Symptoms have increased in the past year.   Hand dominance: Right  PERTINENT HISTORY: HTN; MI x 2 in 2017; Lt shoulder pain; arthritic hip and knee  PAIN:  Are you having pain? Yes: NPRS scale: 0-1/10 Pain location: Lt shoulder  Pain description: tender  Aggravating factors: worse at night; lifting arm or moving the arm; putting on clothes  Relieving factors: tylenol   PRECAUTIONS: None  WEIGHT BEARING RESTRICTIONS: No  FALLS:  Has patient fallen in last 6 months? No  LIVING ENVIRONMENT: Lives with: lives with their family and lives alone Lives in: House/apartment  OCCUPATION: Retired; sedentary   PATIENT GOALS:decrease Lt shoulder pain and gain strength in Lt arm   NEXT MD VISIT: 06/07/23  OBJECTIVE:   PATIENT SURVEYS:  FOTO 50 goal 60 05/07/23: 56 POSTURE: Patient presents with head forward posture with increased thoracic kyphosis; shoulders rounded and elevated; scapulae abducted and rotated along the thoracic spine; head of the humerus anterior in orientation.   UPPER EXTREMITY ROM:   Active ROM Right eval Left eval Left  04/08/23 Left  05/30/23 Left 06/25/23  Shoulder flexion 139 100 108 112 120  Shoulder extension 67 37 46  53 50  Shoulder abduction 138 70 84 109 109  Shoulder adduction       Shoulder internal rotation Thumb T9 Hand to Lt lateral sacrum Hand to waist  Hand to T 11 Hand to T11  Shoulder external rotation 51  8 20 25 31   Elbow flexion       Elbow extension       Wrist flexion       Wrist extension       Wrist ulnar deviation       Wrist radial deviation       Wrist pronation       Wrist supination       (Blank rows = not tested)  UPPER EXTREMITY MMT: Lt UE strength assessed UE at side; unable to assess in MMT positions   MMT Right eval Left eval Left  05/07/23 Left 06/25/23  Shoulder flexion 4 3- 3 3+ in available range   Shoulder extension 5 4 4+ 5-  Shoulder abduction 4 2+ 3- 3 in available range   Shoulder adduction      Shoulder internal rotation 5 2+ 3 4-  Shoulder external rotation 4 2+ 3- 4  Middle trapezius 4 3- 3 4  Lower trapezius 4 3- 3 3+  Elbow flexion      Elbow extension      Wrist flexion      Wrist extension      Wrist ulnar deviation      Wrist radial deviation      Wrist pronation      Wrist supination      Grip strength (lbs)      (Blank rows = not tested)  JOINT MOBILITY TESTING:  Decreased mobility Lt shoulder  04/08/23: improving joint mobility 06/25/23: increased joint mobility  PALPATION:  Muscular tightness Lt pecs; biceps; upper trap; leveator; periscapular musculature; deltoid   04/15/23: increased palpable tightness throughout Lt shoulder girdle noted 06/25/23: decreased palpable tightness though L shoulder girdle - continued tightness in teres/lats/pecs/biceps tendon    [facility] Adult PT Treatment:          DATE: 06/26/23 Therapeutic Exercise: Chin tuck w/ noodle 10 sec x 5 Scap squeeze w/noodle 10 sec x 5  L's w/noodle red TB 2-3 sec 10 x 2 W's w/ noodle red 3 sec x 10 no resistance x 10 Isometric shoulder abduction Lt shoulder at wall 3 sec x 10 x 2   Corner stretch lower and mid level 30 sec x 2    Manual Therapy: Soft tissue mobilization Rt  anterior shoulder; upper trap Skilled palpation to assess response to DN and manual work  Psychiatrist  Treatment instructions: Expect mild to moderate muscle soreness. S/S of pneumothorax if dry needled over a lung field, and to seek immediate medical attention should they occur. Patient verbalized understanding of these instructions and education.  Patient Consent Given: Yes Education handout provided: Previously provided Muscles treated: Lt pecs; upper trap; biceps; teres; lats Electrical stimulation performed: Yes Parameters: mAmp current; intensity to pt tolerance  Treatment response/outcome: decreased palpable tightness noted    Neuromuscular re-ed: Postural education and instruction focus on engaging posterior shoulder girdle musculature Modalities: TENS(home)  MH Rt shoulder x 10 min  Self Care: Modifying recliner to improve posture when sitting  Using of pillows to prop phone, tablet, book when sitting  [facility] Adult PT Treatment:          DATE: 06/19/23 Therapeutic Exercise: Chin tuck w/ noodle 10 sec x 5 Scap squeeze w/noodle 10 sec x 5  Row blue TB step back 3 sec x 10 x 2 Row blue TB 3 sec x 10 x 2 ER green TB step out isometric 3 sec x 10  ER red TB 3 sec x  10  IR green TB step in 3 sec x 10  IR red TB 3 sec x 10  Shoulder adduction green TB 3 sec x 10  L's w/noodle red TB 2-3 sec 10 x 2 W's w/ noodle yellow 3 sec x 10 x 2 Wall push ups x 10  Rolling ball on wall into flexion x 10  Isometric shoulder abduction L shoulder at wall 3 sec x 10 x 2   Isometric ER at wall 3 sec x 10 x 2 Corner stretch lower and mid level 30 sec x 2  ER seated forearm resting on table 3 sec x 10 bringing arm more into abduction 3 sec x 10  Bouncing ball on wall x 50  Nustep L5 x 5 min 2 min with UE level 10   Manual Therapy: Soft tissue mobilization L anterior shoulder; upper trap  Neuromuscular re-ed: Postural education and instruction focus on engaging posterior  shoulder girdle musculature Modalities: TENS(home)  Ice L shoulder x 10 min  Self Care: Modifying recliner to improve posture when sitting  Using of pillows to prop phone, tablet, book when sitting   PATIENT EDUCATION: Education details: POC; HEP Person educated: Patient Education method: Explanation, Demonstration, Tactile cues, Verbal cues, and Handouts Education comprehension: verbalized understanding, returned demonstration, verbal cues required, tactile cues required, and needs further education  HOME EXERCISE PROGRAM:  Access Code: [email or web address] URL: [email or web address] Date: 06/17/2023 Prepared by: Corlis Leak  Exercises - Seated Shoulder Flexion AAROM with Pulley Behind  - 2 x daily - 7 x weekly - 1 sets - 10 reps - 10 sec  hold - Seated Shoulder Scaption AAROM with Pulley at Side  - 2 x daily - 7 x weekly - 1 sets - 10 reps - 10sec  hold - Seated Cervical Retraction  - 3 x daily - 7 x weekly - 1 sets - 10 reps - 5-10 hold - Standing Scapular Retraction  - 3 x daily - 7 x weekly - 1 sets - 10 reps - 10 hold - Shoulder External Rotation and Scapular Retraction  - 3 x daily - 7 x weekly - 1 sets - 10 reps -   hold - Supine Scapular Retraction  - 2 x daily - 7 x weekly - 1 sets - 10 reps - 5-10 sec  hold - Shoulder External Rotation and Scapular Retraction with Resistance  - 2 x daily - 7 x weekly - 1 sets - 10 reps - 3-5 sec  hold - Isometric Shoulder Extension at Wall  - 2 x daily - 7 x weekly - 1 sets - 5-10 reps - 5 sec  hold - Isometric Shoulder External Rotation at Wall  - 2 x daily - 7 x weekly - 1 sets - 5 reps - 5 sec  hold - Standing Backward Shoulder Rolls  - 2 x daily - 7 x weekly - 1 sets - 10 reps - 1-2 sec  hold - Corner Pec Major Stretch  - 2 x daily - 7 x weekly - 1 sets - 3 reps - 30 sec  hold - Standing Shoulder Row Reactive Isometric  - 1 x daily - 7 x weekly - 1-2 sets - 10 reps - 5 sec  hold - External Rotation Reactive Isometrics with Flex Bar   - 1 x daily - 7 x weekly - 1-2 sets - 10 reps - 5 sec  hold - Shoulder Internal Rotation Reactive Isometrics  - 1 x daily - 7 x  weekly - 1-2 sets - 10 reps - 5 sec  hold - Standing shoulder flexion wall slides  - 2 x daily - 7 x weekly - 1 sets - 5-10 reps - 5 sec  hold - Wall Push Up  - 1 x daily - 7 x weekly - 1-3 sets - 10 reps - 3 sec  hold - Supine Shoulder Rhythmic Stabilization- Horizontal Abduction/Adduction  - 2 x daily - 7 x weekly - 1 sets - 5-10 reps - Seated Shoulder External Rotation in Abduction Supported with Dumbbell  - 1 x daily - 7 x weekly - 1 sets - 10 reps - 3 sec  hold Patient Education - TENS Unit - Trigger Point Dry Needling  ASSESSMENT:  CLINICAL IMPRESSION: Excellent progress. Patient reports continued improvement in L shoulder pain with increasing strength and stability and strength in L UE. She has responded well to treatment including manual work, dry needling, PROM, stretching, strengthening.  Noted persistent tightness in Lt shoulder with palpable tightness through Rt shoulder girdle, deep pec muscles, biceps. Progressing well toward stated goals of therapy. Will progress with posterior shoulder girdle and shoulder strengthening as tolerated. Anticipate d/c to independent HEP in 1-2 weeks.   OBJECTIVE IMPAIRMENTS: Lt shoulder pain for the past 10 or more years with increased symptoms in the past year. [name] has poor posture and alignment; limited AROM, decreased strength; decreased functional activity and ADL's using Lt UE; pain; difficulty sleeping due to Lt shoulder pain decreased activity tolerance, decreased mobility, decreased ROM, decreased strength, hypomobility, increased fascial restrictions, impaired flexibility, impaired UE functional use, improper body mechanics, postural dysfunction, and pain.    GOALS: Goals reviewed with patient? Yes  SHORT TERM GOALS: Target date: 04/04/2023   Independent in initial HEP  Baseline: Goal status: met  2.  Improve  posture and alignment with patient to demonstrate improved upright posture with posterior shoulder girdle musculature  Baseline:  Goal status: met   LONG TERM GOALS: Target date: 07/02/2023   Patient reports decreased pain Lt shoulder by 50% allowing her to sleep for longer periods of time(3-4 hours) without awaking due to pain Baseline:  Goal status: met   2.  Increase strength Lt shoulder to 3/5 to 4/5  Baseline:  Goal status: met   3.  Increase AROM Lt shoulder by 5-10 degrees  Baseline:  Goal status: partially met   4.  Patient demonstrates/reports improved functional use of Lt UE for ADL's including light household chores  Baseline:  Goal status:  met    5.  Independent in HEP (including aquatic program as indicated) Baseline:  Goal status: on going   6.  Improve functional limitation score to 60  Baseline:  05/07/23 56  06/25/23: 63 Goal status: met  PLAN:  PT FREQUENCY: 2x/week  PT DURATION: 8 weeks  PLANNED INTERVENTIONS: Therapeutic exercises, Therapeutic activity, Neuromuscular re-education, Patient/Family education, Self Care, Joint mobilization, Aquatic Therapy, Dry Needling, Electrical stimulation, Cryotherapy, Moist heat, Taping, Vasopneumatic device, Ultrasound, Ionotophoresis 4mg /ml Dexamethasone, Manual therapy, and Re-evaluation  PLAN FOR NEXT SESSION: review and progress with exercises and postural correction; manual work, DN, modalities as indicated    [employer], PT 06/25/2023, 8:05 AM

## 2023-06-25 NOTE — Progress Notes (Signed)
Primary Care: Dr Linford Arnold  Primary Cardiologist: Dr Jens Som  HF Cardiology: Dr. Shirlee Latch  HPI: Tara Campbell is a 77 y.o. with a history of CAD, PAF, Asthma, HTN, DMII. Had previous PCI to RCA.   Admitted 04/2021 with NSTEMI. Had cath that showed severe complex stenosis of her LAD in the proximal mid and distal portion of the vessel.  Patent left circumflex with no significant stenosis, patent left main with no significant stenosis, patent RCA with RCA stents mild nonobstructive disease less than 50% stenosis and normal LVEDP.  Her LAD stenosis was unfavorable for PCI due to heavy calcification and marked tortuosity. There is also noted to be a small caliber vessel with likely poor targets for CABG.  Medical therapy was recommended.  Plavix stopped in 9/22 due to nose bleeds.   She was admitted in 1/23 with ?TIA => neurology evaluated, imaging showed no CVA.  Echo in 1/23 showed EF 60-65%, normal RV, IVC normal.   Patient had markedly positive cardiac PET for anterior ischemia.  She had difficult PCI to proximal LAD in 9/23 with DES and PTCA mid LAD.  There was residual 90% stenosis in the mid/distal LAD that could not be approached due to tortuosity. Patient developed chest pain in 10/23 and went to the ER with NSTEMI, repeat cath was unchanged from 9/23 with patent stent, 50% mLAD stenosis at site of PTCA, and 90% mid-distal LAD (not approachable percutaneously).  She had been off Plavix by accident when this event occurred.  She was treated medically.  She was back in the ER again later in 10/23 with chest pain, troponin was negative.  She was started on ranolazine.   Today she returns for AHF follow up with her sister. Overall feeling good. Denies palpitations, CP, dizziness, edema, or PND/Orthopnea. Occasional SOB with prolonged activity. Appetite ok. No fever or chills. Does not weight at home. Taking all medications. No ETOH, smoking. Has been working with PT biweekly for shoulder pain.  Excited about going to [place] June 9th-21st.   Labs (1/23): K 3.9, creatinine 0.9, LDL 66 Labs (10/23): K 4.2, creatinine 1.5, LDL 18 Labs (4/24): K 4.3, SCr .99, TSH 2.71, LDL 108, Hgb A1c 6.3  ECG (personally reviewed): NSR 1st degree AVB 65 bpm  PMH: 1. CAD: Remote PCI to RCA.  NSTEMI in 5/22, LHC showed severe complex stenosis proximal/mid/distal LAD with patent RCA stents.  LAD was unfavorable for PCI with marked tortuosity and small caliber, also poor target for CABG due to small size.  Medical management.  - Echo (1/23): EF 60-65%, normal RV, normal IVC.  - LHC (9/23): She had difficult PCI to proximal LAD with DES and PTCA mid LAD.  There was residual 90% stenosis in the mid/distal LAD that could not be approached due to tortuosity. - NSTEMI (10/23): Repeat cath was unchanged from 9/23 with patent stent, 50% mLAD stenosis at site of PTCA, and 90% mid-distal LAD (not approachable percutaneously). 2. Atrial fibrillation: Paroxysmal.  3. ?TIA in 1/23: Imaging negative.  4. HTN 5. Type 2 diabetes 6. Asthma 7. ABIs normal in 12/22 8. Gout 9. Hyperlipidemia  Review of Systems: All systems reviewed and negative except as per HPI.   Current Outpatient Medications  Medication Sig Dispense Refill   acetaminophen (TYLENOL) 650 MG CR tablet Take 1 tablet (650 mg total) by mouth every 8 (eight) hours as needed for pain. 90 tablet 3   albuterol (VENTOLIN HFA) 108 (90 Base) MCG/ACT inhaler Inhale 2 puffs into  the lungs every 6 (six) hours as needed for wheezing. 18 g 5   allopurinol (ZYLOPRIM) 300 MG tablet Take 1 tablet (300 mg total) by mouth 2 (two) times daily. (Patient taking differently: Take 300 mg by mouth daily.) 180 tablet 1   apixaban (ELIQUIS) 5 MG TABS tablet Take 1 tablet (5 mg total) by mouth 2 (two) times daily. 180 tablet 3   atenolol (TENORMIN) 100 MG tablet Take 1 tablet (100 mg total) by mouth daily. 90 tablet 1   atorvastatin (LIPITOR) 80 MG tablet Take 1 tablet (80 mg  total) by mouth at bedtime. 90 tablet 3   clopidogrel (PLAVIX) 75 MG tablet Take 1 tablet (75 mg total) by mouth daily. 90 tablet 3   cyanocobalamin (VITAMIN B12) 1000 MCG tablet Take 1,000 mcg by mouth daily.     empagliflozin (JARDIANCE) 10 MG TABS tablet Take 1 tablet (10 mg total) by mouth daily before breakfast. 30 tablet 11   EPINEPHrine 0.3 mg/0.3 mL IJ SOAJ injection Inject 0.3 mg into the muscle as needed for anaphylaxis. Call 9-1-1 after use. 1 each 2   Evolocumab with Infusor (REPATHA PUSHTRONEX SYSTEM) 420 MG/3.5ML SOCT Inject 420 mg into the skin every 30 (thirty) days. 3.6 mL 11   ezetimibe (ZETIA) 10 MG tablet Take 1 tablet (10 mg total) by mouth daily. 90 tablet 3   fluticasone (FLONASE) 50 MCG/ACT nasal spray Place 1 spray into both nostrils daily. 16 g 3   fluticasone furoate-vilanterol (BREO ELLIPTA) 200-25 MCG/ACT AEPB INHALE 1 PUFF INTO THE lungs DAILY 60 each 5   glucose blood test strip To be used twice daily for testing blood sugars. E11.65 200 each 11   ipratropium (ATROVENT) 0.03 % nasal spray Place 2 sprays into both nostrils every 12 (twelve) hours. 30 mL 0   ipratropium-albuterol (DUONEB) 0.5-2.5 (3) MG/3ML SOLN Take 3 mLs by nebulization every 6 (six) hours as needed. 3 mL PRN   irbesartan (AVAPRO) 300 MG tablet Take 300 mg by mouth daily.     isosorbide mononitrate (IMDUR) 60 MG 24 hr tablet Patient takes 0.5 tablet in the morning by mouth and 1 tablet in the evening.     levothyroxine (SYNTHROID) 125 MCG tablet Take 1 tablet (125 mcg total) by mouth daily before breakfast. 90 tablet 0   Mepolizumab ([name]) 100 MG/ML SOAJ Inject 1 mL (100 mg total) into the skin every 28 (twenty-eight) days. 1 mL 4   metFORMIN (GLUCOPHAGE) 500 MG tablet Take 2 tablets (1,000 mg total) by mouth 2 (two) times daily with a meal. 180 tablet 3   nitroGLYCERIN (NITROSTAT) 0.4 MG SL tablet Place 1 tablet (0.4 mg total) under the tongue every 5 (five) minutes as needed for chest pain (or  tightness). 25 tablet PRN   Omega-3 Fatty Acids (FISH OIL) 1000 MG CAPS Take 1 capsule (1,000 mg total) by mouth daily. 90 capsule 3   ondansetron (ZOFRAN-ODT) 8 MG disintegrating tablet 8mg  ODT q8 hours prn nausea 20 tablet 1   Polyvinyl Alcohol-Povidone (REFRESH OP) Place 1 drop into both eyes daily as needed (dry eyes).     ranolazine (RANEXA) 500 MG 12 hr tablet Take 1 tablet (500 mg total) by mouth 2 (two) times daily. 60 tablet 5   Vitamin D, Cholecalciferol, 25 MCG (1000 UT) TABS Take 25 mcg by mouth daily in the afternoon. 60 tablet    zinc gluconate 50 MG tablet Take 50 mg by mouth at bedtime.     No current facility-administered  medications for this visit.    Allergies  Allergen Reactions   Sulfamethoxazole Rash    HIVES   Sertraline Other (See Comments)    Nausea/vomiting   Sulfa Antibiotics Rash   Tape Rash    Vinyl;tape    Social History   Socioeconomic History   Marital status: Divorced    Spouse name: Not on file   Number of children: 2   Years of education: 13.5   Highest education level: Some college, no degree  Occupational History    Comment: retired  Tobacco Use   Smoking status: Never   Smokeless tobacco: Never  Vaping Use   Vaping status: Never Used  Substance and Sexual Activity   Alcohol use: Yes    Comment: Rare   Drug use: No   Sexual activity: Not Currently  Other Topics Concern   Not on file  Social History Narrative   Lives alone. Likes to crafts and paint in her free time.      Right Handed    Lives in a one story home    Social Determinants of Health   Financial Resource Strain: Low Risk  (05/05/2023)   Overall Financial Resource Strain (CARDIA)    Difficulty of Paying Living Expenses: Not very hard  Food Insecurity: No Food Insecurity (05/05/2023)   Hunger Vital Sign    Worried About Running Out of Food in the Last Year: Never true    Ran Out of Food in the Last Year: Never true  Transportation Needs: No Transportation Needs  (05/05/2023)   PRAPARE - Administrator, Civil Service (Medical): No    Lack of Transportation (Non-Medical): No  Physical Activity: Insufficiently Active (05/05/2023)   Exercise Vital Sign    Days of Exercise per Week: 1 day    Minutes of Exercise per Session: 10 min  Stress: No Stress Concern Present (05/05/2023)   [employer] of Occupational Health - Occupational Stress Questionnaire    Feeling of Stress : Only a little  Social Connections: Moderately Integrated (05/06/2023)   Social Connection and Isolation Panel [NHANES]    Frequency of Communication with Friends and Family: More than three times a week    Frequency of Social Gatherings with Friends and Family: More than three times a week    Attends Religious Services: 1 to 4 times per year    Active Member of [employer] or Organizations: Yes    Attends Engineer, structural: More than 4 times per year    Marital Status: Divorced  Intimate Partner Violence: Not At Risk (05/06/2023)   Humiliation, Afraid, Rape, and Kick questionnaire    Fear of Current or Ex-Partner: No    Emotionally Abused: No    Physically Abused: No    Sexually Abused: No      Family History  Problem Relation Age of Onset   Hypertension Mother    Hyperlipidemia Mother    Alzheimer's disease Mother    Vascular Disease Mother    Glaucoma Father    Heart disease Father    Hypertension Father    Diabetes Father    Hyperlipidemia Father    Skin cancer Father    Diabetes Paternal Grandmother    Colon cancer Neg Hx    Esophageal cancer Neg Hx     There were no vitals filed for this visit.   Wt Readings from Last 3 Encounters:  06/04/23 85.7 kg (189 lb)  04/30/23 83.5 kg (184 lb)  04/30/23 83 kg (183  lb)   PHYSICAL EXAM: General:  well appearing.  No respiratory difficulty HEENT: normal Neck: supple. JVD ~6 cm. Carotids 2+ bilat; no bruits. No lymphadenopathy or thyromegaly appreciated. Cor: PMI nondisplaced. Regular rate & rhythm.  No rubs, gallops or murmurs. Lungs: clear Abdomen: soft, nontender, nondistended. No hepatosplenomegaly. No bruits or masses. Good bowel sounds. Extremities: no cyanosis, clubbing, rash, edema  Neuro: alert & oriented x 3, cranial nerves grossly intact. moves all 4 extremities w/o difficulty. Affect pleasant.   ASSESSMENT & PLAN: 1. CAD: Remote RCA PCI.  NSTEMI in 5/22, LHC showed severe complex stenosis proximal/mid/distal LAD with patent RCA stents.  LAD was unfavorable for PCI with marked tortuosity and small caliber, also poor target for CABG due to small size.  Medical management initially.  Patient had worsening of angina and cardiac PET was done that was high risk.  She then had cath in 9/23 with difficult PCI to proximal LAD with DES and PTCA mid LAD.  There was residual 90% stenosis in the mid/distal LAD that could not be approached due to tortuosity.  She accidentally stopped her Plavix and had NSTEMI in 10/23 with repeat cath unchanged from 9/23 with patent stent, 50% mLAD stenosis at site of PTCA, and 90% mid-distal LAD (not approachable percutaneously). No chest pain.  - No longer on ASA with eliquis - Continue atorvastatin 80 mg daily + Zetia + Repatha, LDL was 18 10/23.  - Continue atenolol 100 mg daily.  - Continue Imdur 30mg  in the morning and 60mg  in the evening.  - Continue ranolazine 500 mg bid, QTc stable on ECG today.  - Denied for cardiac rehab.  - Continue Jardiance 10 mg daily, BMET today. No GU symptoms. 2. Atrial fibrillation: Paroxysmal.  She is in NSR.  - Continue Eliquis, denies bleeding 3. HTN: BP controlled.  - slightly elevated today, has not had morning meds - Off irbesartan with elevated creatinine, now back to baseline.  4. Diabetes: Continue semaglutide.   She will followup in 3-4 months with Dr. Kathreen Cornfield [facility] AGACNP-BC  06/25/2023

## 2023-06-26 ENCOUNTER — Ambulatory Visit (HOSPITAL_COMMUNITY)
Admission: RE | Admit: 2023-06-26 | Discharge: 2023-06-26 | Disposition: A | Discharge status: Home / Self Care | Payer: PPO | Source: Ambulatory Visit | Attending: Family Medicine | Admitting: Family Medicine

## 2023-06-26 ENCOUNTER — Encounter (HOSPITAL_COMMUNITY): Payer: Self-pay

## 2023-06-26 VITALS — BP 108/64 | HR 75 | Wt 187.2 lb

## 2023-06-26 DIAGNOSIS — Z7901 Long term (current) use of anticoagulants: Secondary | ICD-10-CM | POA: Diagnosis not present

## 2023-06-26 DIAGNOSIS — I10 Essential (primary) hypertension: Secondary | ICD-10-CM | POA: Insufficient documentation

## 2023-06-26 DIAGNOSIS — E118 Type 2 diabetes mellitus with unspecified complications: Secondary | ICD-10-CM

## 2023-06-26 DIAGNOSIS — I480 Paroxysmal atrial fibrillation: Secondary | ICD-10-CM | POA: Diagnosis not present

## 2023-06-26 DIAGNOSIS — I440 Atrioventricular block, first degree: Secondary | ICD-10-CM | POA: Diagnosis not present

## 2023-06-26 DIAGNOSIS — Z7985 Long-term (current) use of injectable non-insulin antidiabetic drugs: Secondary | ICD-10-CM | POA: Insufficient documentation

## 2023-06-26 DIAGNOSIS — I2510 Atherosclerotic heart disease of native coronary artery without angina pectoris: Secondary | ICD-10-CM | POA: Insufficient documentation

## 2023-06-26 DIAGNOSIS — I252 Old myocardial infarction: Secondary | ICD-10-CM | POA: Insufficient documentation

## 2023-06-26 DIAGNOSIS — I25110 Atherosclerotic heart disease of native coronary artery with unstable angina pectoris: Secondary | ICD-10-CM

## 2023-06-26 DIAGNOSIS — Z955 Presence of coronary angioplasty implant and graft: Secondary | ICD-10-CM | POA: Insufficient documentation

## 2023-06-26 DIAGNOSIS — E119 Type 2 diabetes mellitus without complications: Secondary | ICD-10-CM | POA: Insufficient documentation

## 2023-06-26 DIAGNOSIS — I5022 Chronic systolic (congestive) heart failure: Secondary | ICD-10-CM

## 2023-06-26 DIAGNOSIS — J45909 Unspecified asthma, uncomplicated: Secondary | ICD-10-CM | POA: Insufficient documentation

## 2023-06-26 DIAGNOSIS — Z7984 Long term (current) use of oral hypoglycemic drugs: Secondary | ICD-10-CM | POA: Insufficient documentation

## 2023-06-26 DIAGNOSIS — Z79899 Other long term (current) drug therapy: Secondary | ICD-10-CM | POA: Insufficient documentation

## 2023-06-26 NOTE — Patient Instructions (Signed)
Medication Changes: No Changes In Medications at this time.   Follow-Up in: KEEP FOLLOW UP WITH DR. Shirlee Latch AS SCHEDULED   At the [facility], you and your health needs are our priority. We have a designated team specialized in the treatment of Heart Failure. This Care Team includes your primary Heart Failure Specialized Cardiologist (physician), Advanced Practice Providers (APPs- Physician Assistants and Nurse Practitioners), and Pharmacist who all work together to provide you with the care you need, when you need it.   You may see any of the following providers on your designated Care Team at your next follow up:  Dr. Arvilla Meres Dr. Marca Ancona Dr. Marcos Eke, NP Robbie Lis, [place] [facility] [place], [place] Brynda Peon, NP Karle Plumber, PharmD   Please be sure to bring in all your medications bottles to every appointment.   Need to Contact us:  If you have any questions or concerns before your next appointment please send [place] a message through [place] or call our office at [phone].    TO LEAVE A MESSAGE FOR THE NURSE SELECT OPTION 2, PLEASE LEAVE A MESSAGE INCLUDING: YOUR NAME DATE OF BIRTH CALL BACK NUMBER REASON FOR CALL**this is important as we prioritize the call backs  YOU WILL RECEIVE A CALL BACK THE SAME DAY AS LONG AS YOU CALL BEFORE 4:00 PM

## 2023-06-26 NOTE — Progress Notes (Signed)
ReDS Vest / Clip - 06/26/23 1500       ReDS Vest / Clip   Station Marker A    Ruler Value 31    ReDS Value Range Low volume    ReDS Actual Value 28

## 2023-07-01 ENCOUNTER — Encounter: Payer: Self-pay | Admitting: Rehabilitative and Restorative Service Providers"

## 2023-07-01 ENCOUNTER — Ambulatory Visit: Payer: PPO | Admitting: Rehabilitative and Restorative Service Providers"

## 2023-07-01 DIAGNOSIS — M25512 Pain in left shoulder: Secondary | ICD-10-CM | POA: Diagnosis not present

## 2023-07-01 DIAGNOSIS — G8929 Other chronic pain: Secondary | ICD-10-CM

## 2023-07-01 DIAGNOSIS — R29898 Other symptoms and signs involving the musculoskeletal system: Secondary | ICD-10-CM

## 2023-07-01 DIAGNOSIS — M6281 Muscle weakness (generalized): Secondary | ICD-10-CM

## 2023-07-01 DIAGNOSIS — R293 Abnormal posture: Secondary | ICD-10-CM

## 2023-07-01 NOTE — Therapy (Signed)
OUTPATIENT PHYSICAL THERAPY SHOULDER TREATMENT  Reporting Period 03/07/23 to 04/08/23  See note below for Objective Data and Assessment of Progress/Goals.     Patient Name: Tara Campbell MRN: [id number] DOB:1946/04/17, 77 y.o., female Today's Date: 07/01/2023  END OF SESSION:  PT End of Session - 07/01/23 0847     Visit Number 21    Number of Visits 25    Date for PT Re-Evaluation 07/02/23    Authorization Type [employer] $10 copay    Progress Note Due on Visit 21    PT Start Time 0845    PT Stop Time 0933    PT Time Calculation (min) 48 min    Activity Tolerance Patient tolerated treatment well             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   Otomycosis of right ear 09/27/2017   Paroxysmal atrial fibrillation (HCC)    Thyroid disease    Trochanteric bursitis of right hip 05/29/2017   Past Surgical History:  Procedure Laterality Date   ANGIOPLASTY     CARDIAC CATHETERIZATION     COLONOSCOPY W/ POLYPECTOMY  01/04/2015   CORONARY STENT INTERVENTION N/A 08/31/2022   Procedure: CORONARY STENT INTERVENTION;  Surgeon: Tonny Bollman, MD;  Location: [facility]IVE [facility];  Service: Cardiovascular;  Laterality: N/A;   HEMORRHOID BANDING     LEFT HEART CATH AND CORONARY ANGIOGRAPHY N/A 04/26/2021   Procedure: LEFT HEART CATH AND CORONARY ANGIOGRAPHY;  Surgeon: Tonny Bollman, MD;  Location: [facility] INVASIVE CV LAB;  Service: Cardiovascular;  Laterality: N/A;   LEFT HEART CATH AND CORONARY ANGIOGRAPHY N/A 09/14/2022   Procedure: LEFT HEART CATH AND CORONARY ANGIOGRAPHY;  Surgeon: Tonny Bollman, MD;  Location: [facility] INVASIVE CV LAB;  Service: Cardiovascular;  Laterality:  N/A;   Patient Active Problem List   Diagnosis Date Noted   Coughing 01/17/2023   NSTEMI (non-ST elevated myocardial infarction) (HCC) 09/14/2022   Hypokalemia 09/14/2022   Non-STEMI (non-ST elevated myocardial infarction) (HCC) 09/14/2022   GAD (generalized anxiety disorder) 09/10/2022   Primary osteoarthritis of right knee 08/16/2022   Hypervitaminosis 01/03/2022   Memory impairment 01/03/2022   TIA (transient ischemic attack) 12/26/2021   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 10/02/2021   Obesity (BMI 30-39.9) 09/06/2021   Angina pectoris (HCC) 06/23/2021   Hyperlipidemia 04/27/2021   History of non-ST elevation myocardial infarction (NSTEMI) 04/25/2021   Hilar adenopathy 10/13/2018   Mediastinal adenopathy 10/13/2018   Dupuytren's contracture of left hand 08/28/2018   Trigger finger, left index finger 08/28/2018   Non-seasonal allergic rhinitis 08/26/2018   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   Controlled diabetes mellitus type 2 with complications (HCC) 12/27/2017   Class 1 obesity due to excess calories with serious comorbidity in adult 12/27/2017   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   Encounter for long-term (current) use of medications 11/28/2017   Anticoagulant long-term use - on eliquis for PAF 10/21/2017   Sensorineural hearing loss (SNHL) of both ears 09/27/2017   Hypertension goal BP (blood pressure) < 140/90 08/29/2017  CAD (coronary artery disease) 08/29/2017   Gout of ankle 04/08/2017   History of myocardial infarct at age greater than 60 years 01/23/2017   Mixed diabetic hyperlipidemia associated with type 2 diabetes mellitus (HCC) 01/23/2017   Acquired hypothyroidism 01/23/2017   Severe persistent allergic asthma 01/16/2017   Paroxysmal atrial fibrillation (HCC) 01/16/2017   Cardiomegaly 01/16/2017    PCP: Dr Nani Gasser  REFERRING PROVIDER: Dr Nani Gasser  REFERRING DIAG:  Chronic Lt shoulder pain   THERAPY DIAG:  Chronic left shoulder pain  Other symptoms and signs involving the musculoskeletal system  Muscle weakness (generalized)  Abnormal posture  Rationale for Evaluation and Treatment: Rehabilitation  ONSET DATE: 01/31/22   SUBJECTIVE:                                                                                                                                                                                      SUBJECTIVE STATEMENT: Patient reports that she her shoulder is doing well. Some soreness yesterday. She is working on her exercises at home.    Eval:Patient reports that she has had Lt shoulder pain for years. She was diagnosed with degenerative changes in the Lt shoulder and rotator cuff changes ~ 8 years ago. Symptoms have increased in the past year.   Hand dominance: Right  PERTINENT HISTORY: HTN; MI x 2 in 2017; Lt shoulder pain; arthritic hip and knee  PAIN:  Are you having pain? Yes: NPRS scale: 0/10 Pain location: Lt shoulder  Pain description: tender  Aggravating factors: worse at night; lifting arm or moving the arm; putting on clothes  Relieving factors: tylenol   PRECAUTIONS: None  WEIGHT BEARING RESTRICTIONS: No  FALLS:  Has patient fallen in last 6 months? No  LIVING ENVIRONMENT: Lives with: lives with their family and lives alone Lives in: House/apartment  OCCUPATION: Retired; sedentary   PATIENT GOALS:decrease Lt shoulder pain and gain strength in Lt arm   NEXT MD VISIT: 06/07/23  OBJECTIVE:   PATIENT SURVEYS:  FOTO 50 goal 60 05/07/23: 56 POSTURE: Patient presents with head forward posture with increased thoracic kyphosis; shoulders rounded and elevated; scapulae abducted and rotated along the thoracic spine; head of the humerus anterior in orientation.   UPPER EXTREMITY ROM:   Active ROM Right eval Left eval Left  04/08/23 Left  05/30/23 Left 06/25/23  Shoulder flexion 139 100 108 112 120   Shoulder extension 67 37 46 53 50  Shoulder abduction 138 70 84 109 109  Shoulder adduction       Shoulder internal rotation Thumb T9 Hand to Lt lateral sacrum Hand to waist  Hand to T 11 Hand to T11  Shoulder  external rotation 51 8 20 25 31   Elbow flexion       Elbow extension       Wrist flexion       Wrist extension       Wrist ulnar deviation       Wrist radial deviation       Wrist pronation       Wrist supination       (Blank rows = not tested)  UPPER EXTREMITY MMT: Lt UE strength assessed UE at side; unable to assess in MMT positions   MMT Right eval Left eval Left  05/07/23 Left 06/25/23  Shoulder flexion 4 3- 3 3+ in available range   Shoulder extension 5 4 4+ 5-  Shoulder abduction 4 2+ 3- 3 in available range   Shoulder adduction      Shoulder internal rotation 5 2+ 3 4-  Shoulder external rotation 4 2+ 3- 4  Middle trapezius 4 3- 3 4  Lower trapezius 4 3- 3 3+  Elbow flexion      Elbow extension      Wrist flexion      Wrist extension      Wrist ulnar deviation      Wrist radial deviation      Wrist pronation      Wrist supination      Grip strength (lbs)      (Blank rows = not tested)  JOINT MOBILITY TESTING:  Decreased mobility Lt shoulder  04/08/23: improving joint mobility 06/25/23: increased joint mobility  PALPATION:  Muscular tightness Lt pecs; biceps; upper trap; leveator; periscapular musculature; deltoid   04/15/23: increased palpable tightness throughout Lt shoulder girdle noted 06/25/23: decreased palpable tightness though L shoulder girdle - continued tightness in teres/lats/pecs/biceps tendon     [facility] Adult PT Treatment:          DATE: 07/01/23 Therapeutic Exercise: Chin tuck w/ noodle 10 sec x 5 Scap squeeze w/noodle 10 sec x 5  L's w/noodle red TB 2-3 sec 10 x 2 W's w/ noodle red 3 sec x 10 no resistance x 10 Isometric shoulder abduction Lt shoulder at wall 3 sec x 10 x 2   Corner stretch lower and mid level 30 sec x 2  Row blue TB 3  sec x 20 Shoulder extension blue TB 3 sec x 20  Isometric L shoulder ER green TB 5 sec old x 10  IR green TB step in 3 sec x 10   Rolling ball on wall into flexion x 10  Manual Therapy: Soft tissue mobilization Rt anterior shoulder; upper trap  Neuromuscular re-ed: Postural education and instruction focus on engaging posterior shoulder girdle musculature Modalities: TENS(home)  MH Rt shoulder x 10 min  Self Care: Modifying recliner to improve posture when sitting  Using of pillows to prop phone, tablet, book when sitting  [facility] Adult PT Treatment:          DATE: 06/26/23 Therapeutic Exercise: Chin tuck w/ noodle 10 sec x 5 Scap squeeze w/noodle 10 sec x 5  L's w/noodle red TB 2-3 sec 10 x 2 W's w/ noodle red 3 sec x 10 no resistance x 10 Isometric shoulder abduction Lt shoulder at wall 3 sec x 10 x 2   Corner stretch lower and mid level 30 sec x 2  Alphabet ball on wall  Wall push up x 5; 4 inch ball in L hand x 5  Wall clock yellow TB R/L x 10 x 2  ER sidelying 1 # wt 2 sec x 10  Shoulder abduction sidelying 1# wt 2 sec x 10; repeated w/elbow flexed x 10  Sidelying shoulder circles at 90 deg  Supine shoulder flexion 1# x 10  Supine rhythmic stabilization 1# x 10 Supine punch 1# x 10   Manual Therapy: Soft tissue mobilization Rt anterior shoulder; upper trap Skilled palpation to assess response to DN and manual work  Psychiatrist  Treatment instructions: Expect mild to moderate muscle soreness. S/S of pneumothorax if dry needled over a lung field, and to seek immediate medical attention should they occur. Patient verbalized understanding of these instructions and education.  Patient Consent Given: Yes Education handout provided: Previously provided Muscles treated: Lt pecs; upper trap; biceps; teres; lats Electrical stimulation performed: Yes Parameters: mAmp current; intensity to pt tolerance  Treatment response/outcome: decreased palpable tightness noted     Neuromuscular re-ed: Postural education and instruction focus on engaging posterior shoulder girdle musculature Modalities: TENS(home)  MH Rt shoulder x 10 min  Self Care: Modifying recliner to improve posture when sitting  Using of pillows to prop phone, tablet, book when sitting  [facility] Adult PT Treatment:          DATE: 06/19/23 Therapeutic Exercise: Chin tuck w/ noodle 10 sec x 5 Scap squeeze w/noodle 10 sec x 5  Row blue TB step back 3 sec x 10 x 2 Row blue TB 3 sec x 10 x 2 ER green TB step out isometric 3 sec x 10  ER red TB 3 sec x 10  IR green TB step in 3 sec x 10  IR red TB 3 sec x 10  Shoulder adduction green TB 3 sec x 10  L's w/noodle red TB 2-3 sec 10 x 2 W's w/ noodle yellow 3 sec x 10 x 2 Wall push ups x 10  Rolling ball on wall into flexion x 10  Isometric shoulder abduction L shoulder at wall 3 sec x 10 x 2   Isometric ER at wall 3 sec x 10 x 2 Corner stretch lower and mid level 30 sec x 2  ER seated forearm resting on table 3 sec x 10 bringing arm more into abduction 3 sec x 10  Bouncing ball on wall x 50  Nustep L5 x 5 min 2 min with UE level 10   Manual Therapy: Soft tissue mobilization L anterior shoulder; upper trap  Neuromuscular re-ed: Postural education and instruction focus on engaging posterior shoulder girdle musculature Modalities: TENS(home)  Ice L shoulder x 10 min  Self Care: Modifying recliner to improve posture when sitting  Using of pillows to prop phone, tablet, book when sitting   PATIENT EDUCATION: Education details: POC; HEP Person educated: Patient Education method: Explanation, Demonstration, Tactile cues, Verbal cues, and Handouts Education comprehension: verbalized understanding, returned demonstration, verbal cues required, tactile cues required, and needs further education  HOME EXERCISE PROGRAM:  Access Code: [email or web address] URL: [email or web address] Date: 07/01/2023 Prepared by: Corlis Leak  Exercises -  Seated Shoulder Flexion AAROM with Pulley Behind  - 2 x daily - 7 x weekly - 1 sets - 10 reps - 10 sec  hold - Seated Shoulder Scaption AAROM with Pulley at Side  - 2 x daily - 7 x weekly - 1 sets - 10 reps - 10sec  hold - Seated Cervical Retraction  - 3 x daily - 7 x weekly - 1 sets - 10 reps - 5-10 hold - Standing Scapular Retraction  -  3 x daily - 7 x weekly - 1 sets - 10 reps - 10 hold - Shoulder External Rotation and Scapular Retraction  - 3 x daily - 7 x weekly - 1 sets - 10 reps -   hold - Supine Scapular Retraction  - 2 x daily - 7 x weekly - 1 sets - 10 reps - 5-10 sec  hold - Shoulder External Rotation and Scapular Retraction with Resistance  - 2 x daily - 7 x weekly - 1 sets - 10 reps - 3-5 sec  hold - Isometric Shoulder Extension at Wall  - 2 x daily - 7 x weekly - 1 sets - 5-10 reps - 5 sec  hold - Isometric Shoulder External Rotation at Wall  - 2 x daily - 7 x weekly - 1 sets - 5 reps - 5 sec  hold - Standing Backward Shoulder Rolls  - 2 x daily - 7 x weekly - 1 sets - 10 reps - 1-2 sec  hold - Corner Pec Major Stretch  - 2 x daily - 7 x weekly - 1 sets - 3 reps - 30 sec  hold - Standing Shoulder Row Reactive Isometric  - 1 x daily - 7 x weekly - 1-2 sets - 10 reps - 5 sec  hold - External Rotation Reactive Isometrics with Flex Bar  - 1 x daily - 7 x weekly - 1-2 sets - 10 reps - 5 sec  hold - Shoulder Internal Rotation Reactive Isometrics  - 1 x daily - 7 x weekly - 1-2 sets - 10 reps - 5 sec  hold - Standing shoulder flexion wall slides  - 2 x daily - 7 x weekly - 1 sets - 5-10 reps - 5 sec  hold - Wall Push Up  - 1 x daily - 7 x weekly - 1-3 sets - 10 reps - 3 sec  hold - Supine Shoulder Rhythmic Stabilization- Horizontal Abduction/Adduction  - 2 x daily - 7 x weekly - 1 sets - 5-10 reps - Seated Shoulder External Rotation in Abduction Supported with Dumbbell  - 1 x daily - 7 x weekly - 1 sets - 10 reps - 3 sec  hold - Standing Shoulder Flexion to 90 Degrees with Dumbbells  - 2 x  daily - 7 x weekly - 1 sets - 3 reps - 30 sec  hold - Scaption with Dumbbells  - 2 x daily - 7 x weekly - 1 sets - 3 reps - 30 sec  hold Patient Education - TENS Unit - Trigger Point Dry Needling  ASSESSMENT:  CLINICAL IMPRESSION: Excellent progress overall. Patient reports continued improvement in L shoulder pain with increasing strength and stability and strength in L UE. She has responded well to treatment including manual work, dry needling, PROM, stretching, strengthening.  Noted persistent tightness in Lt shoulder with palpable tightness through Rt shoulder girdle, deep pec muscles, biceps. Progressing well toward stated goals of therapy. Will progress with posterior shoulder girdle and shoulder strengthening as tolerated. Anticipate d/c to independent HEP in 1-2 weeks.   OBJECTIVE IMPAIRMENTS: Lt shoulder pain for the past 10 or more years with increased symptoms in the past year. Tara Campbell has poor posture and alignment; limited AROM, decreased strength; decreased functional activity and ADL's using Lt UE; pain; difficulty sleeping due to Lt shoulder pain decreased activity tolerance, decreased mobility, decreased ROM, decreased strength, hypomobility, increased fascial restrictions, impaired flexibility, impaired UE functional use, improper body mechanics, postural dysfunction, and  pain.    GOALS: Goals reviewed with patient? Yes  SHORT TERM GOALS: Target date: 04/04/2023   Independent in initial HEP  Baseline: Goal status: met  2.  Improve posture and alignment with patient to demonstrate improved upright posture with posterior shoulder girdle musculature  Baseline:  Goal status: met   LONG TERM GOALS: Target date: 07/02/2023   Patient reports decreased pain Lt shoulder by 50% allowing her to sleep for longer periods of time(3-4 hours) without awaking due to pain Baseline:  Goal status: met   2.  Increase strength Lt shoulder to 3/5 to 4/5  Baseline:  Goal status: met   3.   Increase AROM Lt shoulder by 5-10 degrees  Baseline:  Goal status: partially met   4.  Patient demonstrates/reports improved functional use of Lt UE for ADL's including light household chores  Baseline:  Goal status:  met    5.  Independent in HEP (including aquatic program as indicated) Baseline:  Goal status: on going   6.  Improve functional limitation score to 60  Baseline:  05/07/23 56  06/25/23: 63 Goal status: met  PLAN:  PT FREQUENCY: 2x/week  PT DURATION: 8 weeks  PLANNED INTERVENTIONS: Therapeutic exercises, Therapeutic activity, Neuromuscular re-education, Patient/Family education, Self Care, Joint mobilization, Aquatic Therapy, Dry Needling, Electrical stimulation, Cryotherapy, Moist heat, Taping, Vasopneumatic device, Ultrasound, Ionotophoresis 4mg /ml Dexamethasone, Manual therapy, and Re-evaluation  PLAN FOR NEXT SESSION: review and progress with exercises and postural correction; manual work, DN, modalities as indicated    [employer], PT 07/01/2023, 9:35 AM

## 2023-07-03 ENCOUNTER — Ambulatory Visit: Payer: PPO | Admitting: Rehabilitative and Restorative Service Providers"

## 2023-07-03 ENCOUNTER — Encounter: Payer: Self-pay | Admitting: Rehabilitative and Restorative Service Providers"

## 2023-07-03 DIAGNOSIS — M25512 Pain in left shoulder: Secondary | ICD-10-CM | POA: Diagnosis not present

## 2023-07-03 DIAGNOSIS — R293 Abnormal posture: Secondary | ICD-10-CM

## 2023-07-03 DIAGNOSIS — R29898 Other symptoms and signs involving the musculoskeletal system: Secondary | ICD-10-CM

## 2023-07-03 DIAGNOSIS — G8929 Other chronic pain: Secondary | ICD-10-CM

## 2023-07-03 DIAGNOSIS — M6281 Muscle weakness (generalized): Secondary | ICD-10-CM

## 2023-07-03 NOTE — Therapy (Signed)
OUTPATIENT PHYSICAL THERAPY SHOULDER TREATMENT And DISCHARGE SUMMARY  PHYSICAL THERAPY DISCHARGE SUMMARY  Visits from Start of Care: 22  Current functional level related to goals / functional outcomes: See progress note for discharge status   Remaining deficits: Continued end range mobility limits and weakness L shoulder. Patient needs to continue with independent HEP to maintain gains.    Education / Equipment: HEP    Patient agrees to discharge. Patient goals were partially met. Patient is being discharged due to meeting the stated rehab goals.  Aubree Doody P. Leonor Liv PT, MPH 07/03/23 9:35 AM    Patient Name: Tara Campbell MRN: [id number] DOB:November 07, 1946, 77 y.o., female Today's Date: 07/03/2023  END OF SESSION:  PT End of Session - 07/03/23 0843     Visit Number 22    Number of Visits 25    Date for PT Re-Evaluation 07/02/23    Authorization Type [employer] $10 copay    Progress Note Due on Visit 22    PT Start Time 0844    PT Stop Time 0932    PT Time Calculation (min) 48 min    Activity Tolerance Patient tolerated treatment well             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   Otomycosis of right ear 09/27/2017   Paroxysmal atrial fibrillation (HCC)    Thyroid disease    Trochanteric bursitis of right hip 05/29/2017   Past Surgical History:  Procedure Laterality Date   ANGIOPLASTY     CARDIAC CATHETERIZATION     COLONOSCOPY W/ POLYPECTOMY  01/04/2015   CORONARY STENT INTERVENTION N/A 08/31/2022   Procedure: CORONARY STENT INTERVENTION;  Surgeon: Tonny Bollman, MD;  Location: [facility] INVASIVE CV LAB;  Service: Cardiovascular;  Laterality: N/A;    HEMORRHOID BANDING     LEFT HEART CATH AND CORONARY ANGIOGRAPHY N/A 04/26/2021   Procedure: LEFT HEART CATH AND CORONARY ANGIOGRAPHY;  Surgeon: Tonny Bollman, MD;  Location: [facility] INVASIVE CV LAB;  Service: Cardiovascular;  Laterality: N/A;   LEFT HEART CATH AND CORONARY ANGIOGRAPHY N/A 09/14/2022   Procedure: LEFT HEART CATH AND CORONARY ANGIOGRAPHY;  Surgeon: Tonny Bollman, MD;  Location: [facility] INVASIVE CV LAB;  Service: Cardiovascular;  Laterality: N/A;   Patient Active Problem List   Diagnosis Date Noted   Coughing 01/17/2023   NSTEMI (non-ST elevated myocardial infarction) (HCC) 09/14/2022   Hypokalemia 09/14/2022   Non-STEMI (non-ST elevated myocardial infarction) (HCC) 09/14/2022   GAD (generalized anxiety disorder) 09/10/2022   Primary osteoarthritis of right knee 08/16/2022   Hypervitaminosis 01/03/2022   Memory impairment 01/03/2022   TIA (transient ischemic attack) 12/26/2021   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 10/02/2021   Obesity (BMI 30-39.9) 09/06/2021   Angina pectoris (HCC) 06/23/2021   Hyperlipidemia 04/27/2021   History of non-ST elevation myocardial infarction (NSTEMI) 04/25/2021   Hilar adenopathy 10/13/2018   Mediastinal adenopathy 10/13/2018   Dupuytren's contracture of left hand 08/28/2018   Trigger finger, left index finger 08/28/2018   Non-seasonal allergic rhinitis 08/26/2018   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) 08/06/2018   Controlled diabetes mellitus type 2 with complications (HCC) 12/27/2017   Class 1 obesity due to excess calories with serious comorbidity in adult 12/27/2017   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   Encounter for long-term (current) use of medications 11/28/2017   Anticoagulant long-term use - on eliquis for PAF 10/21/2017   Sensorineural hearing loss (SNHL) of both ears 09/27/2017   Hypertension goal BP (blood pressure) < 140/90 08/29/2017   CAD (coronary artery disease) 08/29/2017    Gout of ankle 04/08/2017   History of myocardial infarct at age greater than 60 years 01/23/2017   Mixed diabetic hyperlipidemia associated with type 2 diabetes mellitus (HCC) 01/23/2017   Acquired hypothyroidism 01/23/2017   Severe persistent allergic asthma 01/16/2017   Paroxysmal atrial fibrillation (HCC) 01/16/2017   Cardiomegaly 01/16/2017    PCP: Dr Nani Gasser  REFERRING PROVIDER: Dr Nani Gasser  REFERRING DIAG: Chronic Lt shoulder pain   THERAPY DIAG:  Chronic left shoulder pain  Other symptoms and signs involving the musculoskeletal system  Muscle weakness (generalized)  Abnormal posture  Rationale for Evaluation and Treatment: Rehabilitation  ONSET DATE: 01/31/22   SUBJECTIVE:                                                                                                                                                                                      SUBJECTIVE STATEMENT: Patient reports that she her shoulder is doing well. She is working on exercises well at home. Feels independent in exercises.    Eval:Patient reports that she has had Lt shoulder pain for years. She was diagnosed with degenerative changes in the Lt shoulder and rotator cuff changes ~ 8 years ago. Symptoms have increased in the past year.   Hand dominance: Right  PERTINENT HISTORY: HTN; MI x 2 in 2017; Lt shoulder pain; arthritic hip and knee  PAIN:  Are you having pain? Yes: NPRS scale: 0/10 Pain location: Lt shoulder  Pain description: tender  Aggravating factors: worse at night; lifting arm or moving the arm; putting on clothes  Relieving factors: tylenol   PRECAUTIONS: None  WEIGHT BEARING RESTRICTIONS: No  FALLS:  Has patient fallen in last 6 months? No  LIVING ENVIRONMENT: Lives with: lives with their family and lives alone Lives in: House/apartment  OCCUPATION: Retired; sedentary   PATIENT GOALS:decrease Lt shoulder pain and gain strength in Lt arm    NEXT MD VISIT: 06/07/23  OBJECTIVE:   PATIENT SURVEYS:  FOTO 50 goal 60 05/07/23: 56 POSTURE: Patient presents with head forward posture with increased thoracic kyphosis; shoulders rounded and elevated; scapulae abducted and rotated along the thoracic spine; head of the humerus anterior in  orientation.   UPPER EXTREMITY ROM:   Active ROM Right eval Left eval Left  04/08/23 Left  05/30/23 Left 06/25/23 Left  07/03/23  Shoulder flexion 139 100 108 112 120 122  Shoulder extension 67 37 46 53 50 53  Shoulder abduction 138 70 84 109 109 110  Shoulder adduction        Shoulder internal rotation Thumb T9 Hand to Lt lateral sacrum Hand to waist  Hand to T 11 Hand to T11 Hand to T11  Shoulder external rotation 51 8 20 25 31 31   Elbow flexion        Elbow extension        Wrist flexion        Wrist extension        Wrist ulnar deviation        Wrist radial deviation        Wrist pronation        Wrist supination        (Blank rows = not tested)  UPPER EXTREMITY MMT: Lt UE strength assessed UE at side; unable to assess in MMT positions   MMT Right eval Left eval Left  05/07/23 Left 06/25/23  Shoulder flexion 4 3- 3 3+ in available range   Shoulder extension 5 4 4+ 5-  Shoulder abduction 4 2+ 3- 3 in available range   Shoulder adduction      Shoulder internal rotation 5 2+ 3 4-  Shoulder external rotation 4 2+ 3- 4  Middle trapezius 4 3- 3 4  Lower trapezius 4 3- 3 3+  Elbow flexion      Elbow extension      Wrist flexion      Wrist extension      Wrist ulnar deviation      Wrist radial deviation      Wrist pronation      Wrist supination      Grip strength (lbs)      (Blank rows = not tested)  JOINT MOBILITY TESTING:  Decreased mobility Lt shoulder  04/08/23: improving joint mobility 06/25/23: increased joint mobility  PALPATION:  Muscular tightness Lt pecs; biceps; upper trap; leveator; periscapular musculature; deltoid   04/15/23: increased palpable tightness  throughout Lt shoulder girdle noted 06/25/23: decreased palpable tightness though L shoulder girdle - continued tightness in teres/lats/pecs/biceps tendon      [facility] Adult PT Treatment:          DATE: 07/03/23 Therapeutic Exercise: Chin tuck w/ noodle 10 sec x 5 Scap squeeze w/noodle 10 sec x 5  L's w/noodle red TB 2-3 sec 10 x 2 W's w/ noodle yellow 3 sec x 10  Isometric shoulder abduction Lt shoulder at wall 3 sec x 10 x 2   Corner stretch lower and mid level 30 sec x 2  Row blue TB 3 sec x 20 Shoulder extension blue TB 3 sec x 20  Isometric L shoulder ER green TB 5 sec old x 10  IR green TB step in 3 sec x 10   Rolling ball on wall into flexion x 10  Manual Therapy: Soft tissue mobilization Rt anterior shoulder; upper trap Skilled palpation to assess response to DN and manual work  Psychiatrist  Treatment instructions: Expect mild to moderate muscle soreness. S/S of pneumothorax if dry needled over a lung field, and to seek immediate medical attention should they occur. Patient verbalized understanding of these instructions and education.  Patient Consent Given: Yes Education handout provided: Previously  provided Muscles treated: Lt pecs; upper trap; biceps; teres; lats Electrical stimulation performed: Yes Parameters: mAmp current; intensity to pt tolerance  Treatment response/outcome: decreased palpable tightness noted    Neuromuscular re-ed: Postural education and instruction focus on engaging posterior shoulder girdle musculature Modalities: TENS(home)  MH Rt shoulder x 10 min  Self Care: Modifying recliner to improve posture when sitting  Using of pillows to prop phone, tablet, book when sitting  [facility] Adult PT Treatment:          DATE: 07/01/23 Therapeutic Exercise: Chin tuck w/ noodle 10 sec x 5 Scap squeeze w/noodle 10 sec x 5  L's w/noodle red TB 2-3 sec 10 x 2 W's w/ noodle red 3 sec x 10 no resistance x 10 Isometric shoulder abduction Lt shoulder  at wall 3 sec x 10 x 2   Corner stretch lower and mid level 30 sec x 2  Row blue TB 3 sec x 20 Shoulder extension blue TB 3 sec x 20  Isometric L shoulder ER green TB 5 sec old x 10  IR green TB step in 3 sec x 10   Rolling ball on wall into flexion x 10  Manual Therapy: Soft tissue mobilization Rt anterior shoulder; upper trap  Neuromuscular re-ed: Postural education and instruction focus on engaging posterior shoulder girdle musculature Modalities: TENS(home)  MH Rt shoulder x 10 min  Self Care: Modifying recliner to improve posture when sitting  Using of pillows to prop phone, tablet, book when sitting   PATIENT EDUCATION: Education details: POC; HEP Person educated: Patient Education method: Explanation, Demonstration, Tactile cues, Verbal cues, and Handouts Education comprehension: verbalized understanding, returned demonstration, verbal cues required, tactile cues required, and needs further education  HOME EXERCISE PROGRAM:  Access Code: [email or web address] URL: [email or web address] Date: 07/01/2023 Prepared by: Corlis Leak  Exercises - Seated Shoulder Flexion AAROM with Pulley Behind  - 2 x daily - 7 x weekly - 1 sets - 10 reps - 10 sec  hold - Seated Shoulder Scaption AAROM with Pulley at Side  - 2 x daily - 7 x weekly - 1 sets - 10 reps - 10sec  hold - Seated Cervical Retraction  - 3 x daily - 7 x weekly - 1 sets - 10 reps - 5-10 hold - Standing Scapular Retraction  - 3 x daily - 7 x weekly - 1 sets - 10 reps - 10 hold - Shoulder External Rotation and Scapular Retraction  - 3 x daily - 7 x weekly - 1 sets - 10 reps -   hold - Supine Scapular Retraction  - 2 x daily - 7 x weekly - 1 sets - 10 reps - 5-10 sec  hold - Shoulder External Rotation and Scapular Retraction with Resistance  - 2 x daily - 7 x weekly - 1 sets - 10 reps - 3-5 sec  hold - Isometric Shoulder Extension at Wall  - 2 x daily - 7 x weekly - 1 sets - 5-10 reps - 5 sec  hold - Isometric Shoulder  External Rotation at Wall  - 2 x daily - 7 x weekly - 1 sets - 5 reps - 5 sec  hold - Standing Backward Shoulder Rolls  - 2 x daily - 7 x weekly - 1 sets - 10 reps - 1-2 sec  hold - Corner Pec Major Stretch  - 2 x daily - 7 x weekly - 1 sets - 3 reps - 30 sec  hold - Standing Shoulder Row  Reactive Isometric  - 1 x daily - 7 x weekly - 1-2 sets - 10 reps - 5 sec  hold - External Rotation Reactive Isometrics with Flex Bar  - 1 x daily - 7 x weekly - 1-2 sets - 10 reps - 5 sec  hold - Shoulder Internal Rotation Reactive Isometrics  - 1 x daily - 7 x weekly - 1-2 sets - 10 reps - 5 sec  hold - Standing shoulder flexion wall slides  - 2 x daily - 7 x weekly - 1 sets - 5-10 reps - 5 sec  hold - Wall Push Up  - 1 x daily - 7 x weekly - 1-3 sets - 10 reps - 3 sec  hold - Supine Shoulder Rhythmic Stabilization- Horizontal Abduction/Adduction  - 2 x daily - 7 x weekly - 1 sets - 5-10 reps - Seated Shoulder External Rotation in Abduction Supported with Dumbbell  - 1 x daily - 7 x weekly - 1 sets - 10 reps - 3 sec  hold - Standing Shoulder Flexion to 90 Degrees with Dumbbells  - 2 x daily - 7 x weekly - 1 sets - 3 reps - 30 sec  hold - Scaption with Dumbbells  - 2 x daily - 7 x weekly - 1 sets - 3 reps - 30 sec  hold Patient Education - TENS Unit - Trigger Point Dry Needling  ASSESSMENT:  CLINICAL IMPRESSION: Excellent progress overall. Patient reports and demonstrates continued improvement in L shoulder pain with increasing strength and stability and strength in L UE. She has responded well to treatment including manual work, dry needling, PROM, stretching, strengthening.  Noted persistent tightness in Lt shoulder with palpable tightness through Rt shoulder girdle, deep pec muscles, biceps. Goals of therapy are partially accomplished.     OBJECTIVE IMPAIRMENTS: Lt shoulder pain for the past 10 or more years with increased symptoms in the past year. Tara Campbell has poor posture and alignment; limited AROM,  decreased strength; decreased functional activity and ADL's using Lt UE; pain; difficulty sleeping due to Lt shoulder pain decreased activity tolerance, decreased mobility, decreased ROM, decreased strength, hypomobility, increased fascial restrictions, impaired flexibility, impaired UE functional use, improper body mechanics, postural dysfunction, and pain.    GOALS: Goals reviewed with patient? Yes  SHORT TERM GOALS: Target date: 04/04/2023   Independent in initial HEP  Baseline: Goal status: met  2.  Improve posture and alignment with patient to demonstrate improved upright posture with posterior shoulder girdle musculature  Baseline:  Goal status: met   LONG TERM GOALS: Target date: 07/02/2023   Patient reports decreased pain Lt shoulder by 50% allowing her to sleep for longer periods of time(3-4 hours) without awaking due to pain Baseline:  Goal status: met   2.  Increase strength Lt shoulder to 3/5 to 4/5  Baseline:  Goal status: met   3.  Increase AROM Lt shoulder by 5-10 degrees  Baseline:  Goal status: partially met   4.  Patient demonstrates/reports improved functional use of Lt UE for ADL's including light household chores  Baseline:  Goal status:  met    5.  Independent in HEP (including aquatic program as indicated) Baseline:  Goal status: on going   6.  Improve functional limitation score to 60  Baseline:  05/07/23 56  06/25/23: 63 Goal status: met  PLAN:  PT FREQUENCY: 2x/week  PT DURATION: 8 weeks  PLANNED INTERVENTIONS: Therapeutic exercises, Therapeutic activity, Neuromuscular re-education, Patient/Family education, Self Care, Joint mobilization,  Aquatic Therapy, Dry Needling, Electrical stimulation, Cryotherapy, Moist heat, Taping, Vasopneumatic device, Ultrasound, Ionotophoresis 4mg /ml Dexamethasone, Manual therapy, and Re-evaluation  PLAN FOR NEXT SESSION: review and progress with exercises and postural correction; manual work, DN, modalities as  indicated    [employer], PT 07/03/2023, 9:35 AM

## 2023-07-25 ENCOUNTER — Encounter: Payer: Self-pay | Admitting: Pulmonary Disease

## 2023-07-25 ENCOUNTER — Ambulatory Visit: Payer: PPO | Admitting: Pulmonary Disease

## 2023-07-25 DIAGNOSIS — J4541 Moderate persistent asthma with (acute) exacerbation: Secondary | ICD-10-CM

## 2023-07-25 MED ORDER — PREDNISONE 10 MG PO TABS: 40.0000 mg | ORAL_TABLET | Freq: Every day | ORAL | 0 refills | Status: DC

## 2023-07-25 MED ORDER — IPRATROPIUM-ALBUTEROL 0.5-2.5 (3) MG/3ML IN SOLN: 3.0000 mL | Freq: Four times a day (QID) | RESPIRATORY_TRACT | 99 refills | Status: DC | PRN

## 2023-07-25 NOTE — Patient Instructions (Addendum)
Asthma.  Sorry you are not feeling well We will send a prescription for prednisone 40 mg a day for 5 days Continue [name] inhalers Follow-up in 6 months

## 2023-07-25 NOTE — Progress Notes (Signed)
Tara Campbell    [id number]    [birth year]  Primary Care Physician:Metheney, Barbarann Ehlers, MD  Referring Physician: Agapito Games, MD [address] [place],  [place] [address]  Chief complaint:   Follow up for asthma Tara Campbell in March 2022  HPI: 77 year old with severe persistent asthma, nasal polyposis, allergies She is on maximal therapy with LABA/LAMA/ICS, Singulair and is getting allergy shots with the allergist at [facility] to have significant symptoms with recurrent exacerbations.  She is needed to go on prednisone 4 times the past year. Has chronic dyspnea on exertion, wheeze  Pets: No pets Occupation: Used to work in Production manager Exposures: No mold, hot tub, Financial controller.  No feather pillows or comforters Smoking history: Never smoker Travel history: Originally from [place].  Moved to [place] in 2017 Relevant family history: No significant family history of lung disease  Interim history: Continues on [place].  States that breathing is doing very well On Symbicort twice daily but it is no longer covered by insurance and needs an alternative inhaler.  Underwent right heart cath by Dr. Excell Seltzer on October 2023 with findings of patent coronary vessels with recommendation to continue medical therapy.  Cardiology notes reviewed in detail.  Chest x-ray reviewed from February 2024 with no acute cardiopulmonary process   Outpatient Encounter Medications as of 07/25/2023  Medication Sig   acetaminophen (TYLENOL) 650 MG CR tablet Take 1 tablet (650 mg total) by mouth every 8 (eight) hours as needed for pain.   albuterol (VENTOLIN HFA) 108 (90 Base) MCG/ACT inhaler Inhale 2 puffs into the lungs every 6 (six) hours as needed for wheezing.   allopurinol (ZYLOPRIM) 300 MG tablet Take 1 tablet (300 mg total) by mouth 2 (two) times daily. (Patient taking differently: Take 300 mg by mouth daily.)   apixaban (ELIQUIS) 5 MG TABS tablet  Take 1 tablet (5 mg total) by mouth 2 (two) times daily.   atenolol (TENORMIN) 100 MG tablet Take 1 tablet (100 mg total) by mouth daily.   atorvastatin (LIPITOR) 80 MG tablet Take 1 tablet (80 mg total) by mouth at bedtime.   clopidogrel (PLAVIX) 75 MG tablet Take 1 tablet (75 mg total) by mouth daily.   cyanocobalamin (VITAMIN B12) 1000 MCG tablet Take 1,000 mcg by mouth daily.   empagliflozin (JARDIANCE) 10 MG TABS tablet Take 1 tablet (10 mg total) by mouth daily before breakfast.   EPINEPHrine 0.3 mg/0.3 mL IJ SOAJ injection Inject 0.3 mg into the muscle as needed for anaphylaxis. Call 9-1-1 after use.   Evolocumab with Infusor (REPATHA PUSHTRONEX SYSTEM) 420 MG/3.5ML SOCT Inject 420 mg into the skin every 30 (thirty) days.   ezetimibe (ZETIA) 10 MG tablet Take 1 tablet (10 mg total) by mouth daily.   fluticasone (FLONASE) 50 MCG/ACT nasal spray Place 1 spray into both nostrils daily.   fluticasone furoate-vilanterol (BREO ELLIPTA) 200-25 MCG/ACT AEPB INHALE 1 PUFF INTO THE lungs DAILY   glucose blood test strip To be used twice daily for testing blood sugars. E11.65   ipratropium (ATROVENT) 0.03 % nasal spray Place 2 sprays into both nostrils every 12 (twelve) hours.   ipratropium-albuterol (DUONEB) 0.5-2.5 (3) MG/3ML SOLN Take 3 mLs by nebulization every 6 (six) hours as needed.   irbesartan (AVAPRO) 300 MG tablet Take 300 mg by mouth daily.   isosorbide mononitrate (IMDUR) 60 MG 24 hr tablet Patient takes 0.5 tablet in the morning by mouth and  1 tablet in the evening.   levothyroxine (SYNTHROID) 125 MCG tablet Take 1 tablet (125 mcg total) by mouth daily before breakfast.   Mepolizumab ([name]) 100 MG/ML SOAJ Inject 1 mL (100 mg total) into the skin every 28 (twenty-eight) days.   metFORMIN (GLUCOPHAGE) 500 MG tablet Take 2 tablets (1,000 mg total) by mouth 2 (two) times daily with a meal.   nitroGLYCERIN (NITROSTAT) 0.4 MG SL tablet Place 1 tablet (0.4 mg total) under the tongue every 5  (five) minutes as needed for chest pain (or tightness).   Omega-3 Fatty Acids (FISH OIL) 1000 MG CAPS Take 1 capsule (1,000 mg total) by mouth daily.   Polyvinyl Alcohol-Povidone (REFRESH OP) Place 1 drop into both eyes daily as needed (dry eyes).   ranolazine (RANEXA) 500 MG 12 hr tablet Take 1 tablet (500 mg total) by mouth 2 (two) times daily.   Vitamin D, Cholecalciferol, 25 MCG (1000 UT) TABS Take 25 mcg by mouth daily in the afternoon.   zinc gluconate 50 MG tablet Take 50 mg by mouth at bedtime.   [DISCONTINUED] ipratropium-albuterol (DUONEB) 0.5-2.5 (3) MG/3ML SOLN Take 3 mLs by nebulization every 6 (six) hours as needed.   No facility-administered encounter medications on file as of 07/25/2023.    Physical Exam: Blood pressure 130/72, pulse 80, temperature 98.2 F (36.8 C), temperature source Oral, height 5\' 2"  (1.575 m), weight 185 lb 12.8 oz (84.3 kg), SpO2 97 %. Gen:      No acute distress HEENT:  EOMI, sclera anicteric Neck:     No masses; no thyromegaly Lungs:    Clear to auscultation bilaterally; normal respiratory effort CV:         Regular rate and rhythm; no murmurs Abd:      + bowel sounds; soft, non-tender; no palpable masses, no distension Ext:    No edema; adequate peripheral perfusion Skin:      Warm and dry; no rash Neuro: alert and oriented x 3 Psych: normal mood and affect   Data Reviewed: Imaging: Chest x-ray 08/16/2020-no acute cardiopulmonary disease.  Chest x-ray 04/25/2021-no active cardiopulmonary disease Chest x-ray 12/26/2021-no acute cardiopulmonary disease Chest x-ray 01/21/2023-no acute cardiopulmonary disease. I have reviewed the images personally  PFTs: 11/17/2020 FVC 1.74 [62%], FEV1 1.17 (56%], F/F 67 No significant obstruction, restriction suggested by reduced lung volumes.  No bronchodilator response  ACT score  02/21/2021-18 10/31/2021-24 05/23/2022- 24  Labs: CBC 08/05/2018-WBC 7.7, eos 10.5%, absolute eosinophil count 809 CBC  02/21/2021-WBC 8.4, eos 12.4%, absolute eosinophil count 1042  IgE 12/26/2017-712 IgE 02/21/2021-1191  Alpha-1 antitrypsin 11/17/2020-160  Assessment:  Severe persistent eosinophilic asthma Allergic rhinitis with nasal polyposis Continue nucala.  She has good response to biologic therapy Change Symbicort to [facility] due to insurance requirements.  Plan/Recommendations: Change Symbicort to [employer].  Continue [name]. Follow-up in 6 months  Chilton Greathouse MD [facility] 07/25/2023, 10:31 AM  CC: Agapito Games, *

## 2023-07-25 NOTE — Progress Notes (Signed)
Tara Campbell    [id number]    11-16-1946  Primary Care Physician:Metheney, Barbarann Ehlers, MD  Referring Physician: Agapito Games, MD [address] [place],  [place] [address]  Chief complaint:   Follow up for asthma Started [name] in March 2022  HPI: 77 y.o. with severe persistent asthma, nasal polyposis, allergies She is on maximal therapy with LABA/LAMA/ICS, Singulair and is getting allergy shots with the allergist at [facility] to have significant symptoms with recurrent exacerbations.  She is needed to go on prednisone 4 times the past year. Has chronic dyspnea on exertion, wheeze  Started [name] in March 2022 with good control of symptoms  Underwent right heart cath by Dr. Excell Seltzer on October 2023 with findings of patent coronary vessels with recommendation to continue medical therapy.    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  No feather pillows or comforters Smoking history: Never smoker Travel history: Originally from [place].  Moved to [place] in 2017 Relevant family history: No significant family history of lung disease  Interim history: Continues on [place].   Symbicort was changed to Breo at last visit due to insurance requirements Overall she did well with the change  She is here as an acute visit.  Complains of wheezing, dyspnea with cough for the past 4 days with nasal congestion.  Does not report any sick contacts.  Denies any fevers, chills.  Outpatient Encounter Medications as of 07/25/2023  Medication Sig   acetaminophen (TYLENOL) 650 MG CR tablet Take 1 tablet (650 mg total) by mouth every 8 (eight) hours as needed for pain.   albuterol (VENTOLIN HFA) 108 (90 Base) MCG/ACT inhaler Inhale 2 puffs into the lungs every 6 (six) hours as needed for wheezing.   allopurinol (ZYLOPRIM) 300 MG tablet Take 1 tablet (300 mg total) by mouth 2 (two) times daily.  (Patient taking differently: Take 300 mg by mouth daily.)   apixaban (ELIQUIS) 5 MG TABS tablet Take 1 tablet (5 mg total) by mouth 2 (two) times daily.   atenolol (TENORMIN) 100 MG tablet Take 1 tablet (100 mg total) by mouth daily.   atorvastatin (LIPITOR) 80 MG tablet Take 1 tablet (80 mg total) by mouth at bedtime.   clopidogrel (PLAVIX) 75 MG tablet Take 1 tablet (75 mg total) by mouth daily.   cyanocobalamin (VITAMIN B12) 1000 MCG tablet Take 1,000 mcg by mouth daily.   empagliflozin (JARDIANCE) 10 MG TABS tablet Take 1 tablet (10 mg total) by mouth daily before breakfast.   EPINEPHrine 0.3 mg/0.3 mL IJ SOAJ injection Inject 0.3 mg into the muscle as needed for anaphylaxis. Call 9-1-1 after use.   Evolocumab with Infusor (REPATHA PUSHTRONEX SYSTEM) 420 MG/3.5ML SOCT Inject 420 mg into the skin every 30 (thirty) days.   ezetimibe (ZETIA) 10 MG tablet Take 1 tablet (10 mg total) by mouth daily.   fluticasone (FLONASE) 50 MCG/ACT nasal spray Place 1 spray into both nostrils daily.   fluticasone furoate-vilanterol (BREO ELLIPTA) 200-25 MCG/ACT AEPB INHALE 1 PUFF INTO THE lungs DAILY   glucose blood test strip To be used twice daily for testing blood sugars. E11.65   ipratropium (ATROVENT) 0.03 % nasal spray Place 2 sprays into both nostrils every 12 (twelve) hours.   ipratropium-albuterol (DUONEB) 0.5-2.5 (3) MG/3ML SOLN Take 3 mLs by nebulization every 6 (six) hours as needed.   irbesartan (AVAPRO) 300 MG tablet  Take 300 mg by mouth daily.   isosorbide mononitrate (IMDUR) 60 MG 24 hr tablet Patient takes 0.5 tablet in the morning by mouth and 1 tablet in the evening.   levothyroxine (SYNTHROID) 125 MCG tablet Take 1 tablet (125 mcg total) by mouth daily before breakfast.   Mepolizumab ([name]) 100 MG/ML SOAJ Inject 1 mL (100 mg total) into the skin every 28 (twenty-eight) days.   metFORMIN (GLUCOPHAGE) 500 MG tablet Take 2 tablets (1,000 mg total) by mouth 2 (two) times daily with a meal.    nitroGLYCERIN (NITROSTAT) 0.4 MG SL tablet Place 1 tablet (0.4 mg total) under the tongue every 5 (five) minutes as needed for chest pain (or tightness).   Omega-3 Fatty Acids (FISH OIL) 1000 MG CAPS Take 1 capsule (1,000 mg total) by mouth daily.   Polyvinyl Alcohol-Povidone (REFRESH OP) Place 1 drop into both eyes daily as needed (dry eyes).   ranolazine (RANEXA) 500 MG 12 hr tablet Take 1 tablet (500 mg total) by mouth 2 (two) times daily.   Vitamin D, Cholecalciferol, 25 MCG (1000 UT) TABS Take 25 mcg by mouth daily in the afternoon.   zinc gluconate 50 MG tablet Take 50 mg by mouth at bedtime.   [DISCONTINUED] ipratropium-albuterol (DUONEB) 0.5-2.5 (3) MG/3ML SOLN Take 3 mLs by nebulization every 6 (six) hours as needed.   No facility-administered encounter medications on file as of 07/25/2023.    Physical Exam: Blood pressure 120/70, pulse (!) 102, height 5\' 4"  (1.626 m), weight 191 lb (86.6 kg), SpO2 97%. Gen:      No acute distress HEENT:  EOMI, sclera anicteric Neck:     No masses; no thyromegaly Lungs:    Mild expiratory wheeze CV:         Regular rate and rhythm; no murmurs Abd:      + bowel sounds; soft, non-tender; no palpable masses, no distension Ext:    No edema; adequate peripheral perfusion Skin:      Warm and dry; no rash Neuro: alert and oriented x 3 Psych: normal mood and affect   Data Reviewed: Imaging: Chest x-ray 08/16/2020-no acute cardiopulmonary disease.  Chest x-ray 04/25/2021-no active cardiopulmonary disease Chest x-ray 12/26/2021-no acute cardiopulmonary disease Chest x-ray 01/21/2023-no acute cardiopulmonary disease. I have reviewed the images personally  PFTs: 11/17/2020 FVC 1.74 [62%], FEV1 1.17 (56%], F/F 67 No significant obstruction, restriction suggested by reduced lung volumes.  No bronchodilator response  ACT score  02/21/2021-18 10/31/2021-24 05/23/2022- 24  Labs: CBC 08/05/2018-WBC 7.7, eos 10.5%, absolute eosinophil count 809 CBC  02/21/2021-WBC 8.4, eos 12.4%, absolute eosinophil count 1042  IgE 12/26/2017-712 IgE 02/21/2021-1191  Alpha-1 antitrypsin 11/17/2020-160  Assessment:  Severe persistent eosinophilic asthma Allergic rhinitis with nasal polyposis  She has an acute exacerbation of asthma.  No need for antibiotics as she does not have purulent sputum production.  Will call in a prednisone prescription 40 mg a day for 5 days Continue [name], inhalers  Plan/Recommendations: Prednisone 40 mg a day for 5 days Continue Winfield Cunas MD [facility] 07/25/2023, 10:29 AM  CC: Agapito Games, *

## 2023-07-30 DIAGNOSIS — Z7951 Long term (current) use of inhaled steroids: Secondary | ICD-10-CM | POA: Diagnosis not present

## 2023-07-30 DIAGNOSIS — Z7984 Long term (current) use of oral hypoglycemic drugs: Secondary | ICD-10-CM | POA: Diagnosis not present

## 2023-07-30 DIAGNOSIS — E119 Type 2 diabetes mellitus without complications: Secondary | ICD-10-CM | POA: Diagnosis not present

## 2023-07-30 DIAGNOSIS — J4489 Other specified chronic obstructive pulmonary disease: Secondary | ICD-10-CM | POA: Diagnosis not present

## 2023-08-06 ENCOUNTER — Encounter (HOSPITAL_COMMUNITY): Payer: Self-pay | Admitting: Cardiology

## 2023-08-06 ENCOUNTER — Ambulatory Visit (HOSPITAL_COMMUNITY)
Admission: RE | Admit: 2023-08-06 | Discharge: 2023-08-06 | Disposition: A | Discharge status: Home / Self Care | Payer: PPO | Source: Ambulatory Visit | Attending: Cardiology | Admitting: Cardiology

## 2023-08-06 VITALS — BP 130/70 | HR 91 | Wt 189.4 lb

## 2023-08-06 DIAGNOSIS — J45909 Unspecified asthma, uncomplicated: Secondary | ICD-10-CM | POA: Insufficient documentation

## 2023-08-06 DIAGNOSIS — Z7984 Long term (current) use of oral hypoglycemic drugs: Secondary | ICD-10-CM | POA: Insufficient documentation

## 2023-08-06 DIAGNOSIS — Z79899 Other long term (current) drug therapy: Secondary | ICD-10-CM | POA: Insufficient documentation

## 2023-08-06 DIAGNOSIS — Z7901 Long term (current) use of anticoagulants: Secondary | ICD-10-CM | POA: Insufficient documentation

## 2023-08-06 DIAGNOSIS — I10 Essential (primary) hypertension: Secondary | ICD-10-CM | POA: Diagnosis not present

## 2023-08-06 DIAGNOSIS — E119 Type 2 diabetes mellitus without complications: Secondary | ICD-10-CM | POA: Insufficient documentation

## 2023-08-06 DIAGNOSIS — E782 Mixed hyperlipidemia: Secondary | ICD-10-CM | POA: Diagnosis not present

## 2023-08-06 DIAGNOSIS — I5022 Chronic systolic (congestive) heart failure: Secondary | ICD-10-CM | POA: Diagnosis not present

## 2023-08-06 DIAGNOSIS — Z7902 Long term (current) use of antithrombotics/antiplatelets: Secondary | ICD-10-CM | POA: Insufficient documentation

## 2023-08-06 DIAGNOSIS — I480 Paroxysmal atrial fibrillation: Secondary | ICD-10-CM | POA: Insufficient documentation

## 2023-08-06 DIAGNOSIS — I2510 Atherosclerotic heart disease of native coronary artery without angina pectoris: Secondary | ICD-10-CM | POA: Insufficient documentation

## 2023-08-06 DIAGNOSIS — I252 Old myocardial infarction: Secondary | ICD-10-CM | POA: Diagnosis not present

## 2023-08-06 DIAGNOSIS — Z8673 Personal history of transient ischemic attack (TIA), and cerebral infarction without residual deficits: Secondary | ICD-10-CM | POA: Diagnosis not present

## 2023-08-06 DIAGNOSIS — Z955 Presence of coronary angioplasty implant and graft: Secondary | ICD-10-CM | POA: Diagnosis not present

## 2023-08-06 LAB — BASIC METABOLIC PANEL
Anion gap: 10 (ref 5–15)
BUN: 13 mg/dL (ref 8–23)
CO2: 26 mmol/L (ref 22–32)
Calcium: 10.1 mg/dL (ref 8.9–10.3)
Chloride: 103 mmol/L (ref 98–111)
Creatinine, Ser: 1.15 mg/dL — ABNORMAL HIGH (ref 0.44–1.00)
GFR, Estimated: 48 mL/min — ABNORMAL LOW (ref >60)
Glucose, Bld: 126 mg/dL — ABNORMAL HIGH (ref 70–99)
Potassium: 4.1 mmol/L (ref 3.5–5.1)
Sodium: 139 mmol/L (ref 135–145)

## 2023-08-06 LAB — LIPID PANEL
Cholesterol: 297 mg/dL — ABNORMAL HIGH (ref 0–200)
HDL: 53 mg/dL (ref >40)
LDL Cholesterol: 188 mg/dL — ABNORMAL HIGH (ref 0–99)
Total CHOL/HDL Ratio: 5.6 ratio
Triglycerides: 281 mg/dL — ABNORMAL HIGH (ref <150)
VLDL: 56 mg/dL — ABNORMAL HIGH (ref 0–40)

## 2023-08-06 NOTE — Patient Instructions (Signed)
Medication Changes:  STOP Plavix  Lab Work:  Labs done today, your results will be available in MyChart, we will contact you for abnormal readings.  Special Instructions // Education:  Do the following things EVERYDAY: Weigh yourself in the morning before breakfast. Write it down and keep it in a log. Take your medicines as prescribed Eat low salt foods--Limit salt (sodium) to 2000 mg per day.  Stay as active as you can everyday Limit all fluids for the day to less than 2 liters   Follow-Up in: 4 months   At the [facility], you and your health needs are our priority. We have a designated team specialized in the treatment of Heart Failure. This Care Team includes your primary Heart Failure Specialized Cardiologist (physician), Advanced Practice Providers (APPs- Physician Assistants and Nurse Practitioners), and Pharmacist who all work together to provide you with the care you need, when you need it.   You may see any of the following providers on your designated Care Team at your next follow up:  Dr. Arvilla Meres Dr. Marca Ancona Dr. Marcos Eke, NP Robbie Lis, [place] [facility] [place], [place] Brynda Peon, NP Karle Plumber, PharmD   Please be sure to bring in all your medications bottles to every appointment.   Need to Contact us:  If you have any questions or concerns before your next appointment please send [place] a message through [place] or call our office at [phone].    TO LEAVE A MESSAGE FOR THE NURSE SELECT OPTION 2, PLEASE LEAVE A MESSAGE INCLUDING: YOUR NAME DATE OF BIRTH CALL BACK NUMBER REASON FOR CALL**this is important as we prioritize the call backs  YOU WILL RECEIVE A CALL BACK THE SAME DAY AS LONG AS YOU CALL BEFORE 4:00 PM

## 2023-08-06 NOTE — Progress Notes (Signed)
Primary Care: Dr Linford Arnold  Primary Cardiologist: Dr Jens Som  HF Cardiology: Dr. Shirlee Latch  HPI: Tara Campbell is a 77 y.o. with a history of CAD, PAF, Asthma, HTN, DMII. Had previous PCI to RCA.   Admitted 04/2021 with NSTEMI. Had cath that showed severe complex stenosis of her LAD in the proximal mid and distal portion of the vessel.  Patent left circumflex with no significant stenosis, patent left main with no significant stenosis, patent RCA with RCA stents mild nonobstructive disease less than 50% stenosis and normal LVEDP.  Her LAD stenosis was unfavorable for PCI due to heavy calcification and marked tortuosity. There is also noted to be a small caliber vessel with likely poor targets for CABG.  Medical therapy was recommended.  Plavix stopped in 9/22 due to nose bleeds.   She was admitted in 1/23 with ?TIA => neurology evaluated, imaging showed no CVA.  Echo in 1/23 showed EF 60-65%, normal RV, IVC normal.   Patient had markedly positive cardiac PET for anterior ischemia.  She had difficult PCI to proximal LAD in 9/23 with DES and PTCA mid LAD.  There was residual 90% stenosis in the mid/distal LAD that could not be approached due to tortuosity. Patient developed chest pain in 10/23 and went to the ER with NSTEMI, repeat cath was unchanged from 9/23 with patent stent, 50% mLAD stenosis at site of PTCA, and 90% mid-distal LAD (not approachable percutaneously).  She had been off Plavix by accident when this event occurred.  She was treated medically.  She was back in the ER again later in 10/23 with chest pain, troponin was negative.  She was started on ranolazine.   She had motion sickness and pain between her shoulder blades while on vacation in [place] in 6/24.  Echo showed EF 70% and stress test showed no ischemia. Troponin negative.  Today she returns for HF follow up.  She needed a course of prednisone 2-3 weeks ago for asthma exacerbation and wheezing. No significant dyspnea  currently, walks dog 5-6 times/day.  No chest pain. No orthopnea/PND. Weight stable. No palpitations.  Labs (1/23): K 3.9, creatinine 0.9, LDL 66 Labs (10/23): K 4.2, creatinine 1.5, LDL 18 Labs (4/24): K 4.3, SCr .99, TSH 2.71, LDL 108, Hgb A1c 6.3 Labs (7/24): K 4.5, creatinine 0.96, hgb 12  ECG (personally reviewed): NSR, QTc 442 msec  PMH: 1. CAD: Remote PCI to RCA.  NSTEMI in 5/22, LHC showed severe complex stenosis proximal/mid/distal LAD with patent RCA stents.  LAD was unfavorable for PCI with marked tortuosity and small caliber, also poor target for CABG due to small size.  Medical management.  - Echo (1/23): EF 60-65%, normal RV, normal IVC.  - LHC (9/23): She had difficult PCI to proximal LAD with DES and PTCA mid LAD.  There was residual 90% stenosis in the mid/distal LAD that could not be approached due to tortuosity. - NSTEMI (10/23): Repeat cath was unchanged from 9/23 with patent stent, 50% mLAD stenosis at site of PTCA, and 90% mid-distal LAD (not approachable percutaneously). - Stress test in [place] in 6/24 with no ischemia.  2. Atrial fibrillation: Paroxysmal.  3. ?TIA in 1/23: Imaging negative.  4. HTN 5. Type 2 diabetes 6. Asthma 7. ABIs normal in 12/22 8. Gout 9. Hyperlipidemia  Review of Systems: All systems reviewed and negative except as per HPI.   Current Outpatient Medications  Medication Sig Dispense Refill   acetaminophen (TYLENOL) 650 MG CR tablet Take 1  tablet (650 mg total) by mouth every 8 (eight) hours as needed for pain. 90 tablet 3   albuterol (VENTOLIN HFA) 108 (90 Base) MCG/ACT inhaler Inhale 2 puffs into the lungs every 6 (six) hours as needed for wheezing. 18 g 5   allopurinol (ZYLOPRIM) 300 MG tablet Take 300 mg by mouth daily.     apixaban (ELIQUIS) 5 MG TABS tablet Take 1 tablet (5 mg total) by mouth 2 (two) times daily. 180 tablet 3   atenolol (TENORMIN) 100 MG tablet Take 1 tablet (100 mg total) by mouth daily. 90 tablet 1   atorvastatin  (LIPITOR) 80 MG tablet Take 1 tablet (80 mg total) by mouth at bedtime. 90 tablet 3   cyanocobalamin (VITAMIN B12) 1000 MCG tablet Take 1,000 mcg by mouth daily.     empagliflozin (JARDIANCE) 10 MG TABS tablet Take 1 tablet (10 mg total) by mouth daily before breakfast. 30 tablet 11   EPINEPHrine 0.3 mg/0.3 mL IJ SOAJ injection Inject 0.3 mg into the muscle as needed for anaphylaxis. Call 9-1-1 after use. 1 each 2   Evolocumab with Infusor (REPATHA PUSHTRONEX SYSTEM) 420 MG/3.5ML SOCT Inject 420 mg into the skin every 30 (thirty) days. 3.6 mL 11   ezetimibe (ZETIA) 10 MG tablet Take 1 tablet (10 mg total) by mouth daily. 90 tablet 3   fluticasone (FLONASE) 50 MCG/ACT nasal spray Place 1 spray into both nostrils daily. 16 g 3   fluticasone furoate-vilanterol (BREO ELLIPTA) 200-25 MCG/ACT AEPB INHALE 1 PUFF INTO THE lungs DAILY 60 each 5   glucose blood test strip To be used twice daily for testing blood sugars. E11.65 200 each 11   ipratropium (ATROVENT) 0.03 % nasal spray Place 2 sprays into both nostrils every 12 (twelve) hours. 30 mL 0   ipratropium-albuterol (DUONEB) 0.5-2.5 (3) MG/3ML SOLN Take 3 mLs by nebulization every 6 (six) hours as needed. 3 mL PRN   irbesartan (AVAPRO) 300 MG tablet Take 300 mg by mouth daily.     isosorbide mononitrate (IMDUR) 60 MG 24 hr tablet Patient takes 0.5 tablet in the morning by mouth and 1 tablet in the evening.     levothyroxine (SYNTHROID) 125 MCG tablet Take 1 tablet (125 mcg total) by mouth daily before breakfast. 90 tablet 0   Mepolizumab ([name]) 100 MG/ML SOAJ Inject 1 mL (100 mg total) into the skin every 28 (twenty-eight) days. 1 mL 4   metFORMIN (GLUCOPHAGE) 500 MG tablet Take 2 tablets (1,000 mg total) by mouth 2 (two) times daily with a meal. 180 tablet 3   nitroGLYCERIN (NITROSTAT) 0.4 MG SL tablet Place 1 tablet (0.4 mg total) under the tongue every 5 (five) minutes as needed for chest pain (or tightness). 25 tablet PRN   Omega-3 Fatty Acids  (FISH OIL) 1000 MG CAPS Take 1 capsule (1,000 mg total) by mouth daily. 90 capsule 3   Polyvinyl Alcohol-Povidone (REFRESH OP) Place 1 drop into both eyes daily as needed (dry eyes).     ranolazine (RANEXA) 500 MG 12 hr tablet Take 1 tablet (500 mg total) by mouth 2 (two) times daily. 60 tablet 5   Vitamin D, Cholecalciferol, 25 MCG (1000 UT) TABS Take 25 mcg by mouth daily in the afternoon. 60 tablet    zinc gluconate 50 MG tablet Take 50 mg by mouth at bedtime.     No current facility-administered medications for this encounter.    Allergies  Allergen Reactions   Sulfamethoxazole Rash    HIVES  Sertraline Other (See Comments)    Nausea/vomiting   Sulfa Antibiotics Rash   Tape Rash    Vinyl;tape    Social History   Socioeconomic History   Marital status: Divorced    Spouse name: Not on file   Number of children: 2   Years of education: 13.5   Highest education level: Some college, no degree  Occupational History    Comment: retired  Tobacco Use   Smoking status: Never   Smokeless tobacco: Never  Vaping Use   Vaping status: Never Used  Substance and Sexual Activity   Alcohol use: Yes    Comment: Rare   Drug use: No   Sexual activity: Not Currently  Other Topics Concern   Not on file  Social History Narrative   Lives alone. Likes to crafts and paint in her free time.      Right Handed    Lives in a one story home    Social Determinants of Health   Financial Resource Strain: Low Risk  (05/05/2023)   Overall Financial Resource Strain (CARDIA)    Difficulty of Paying Living Expenses: Not very hard  Food Insecurity: No Food Insecurity (05/05/2023)   Hunger Vital Sign    Worried About Running Out of Food in the Last Year: Never true    Ran Out of Food in the Last Year: Never true  Transportation Needs: No Transportation Needs (05/05/2023)   PRAPARE - Administrator, Civil Service (Medical): No    Lack of Transportation (Non-Medical): No  Physical Activity:  Insufficiently Active (05/05/2023)   Exercise Vital Sign    Days of Exercise per Week: 1 day    Minutes of Exercise per Session: 10 min  Stress: No Stress Concern Present (05/05/2023)   [employer] of Occupational Health - Occupational Stress Questionnaire    Feeling of Stress : Only a little  Social Connections: Moderately Integrated (05/06/2023)   Social Connection and Isolation Panel [NHANES]    Frequency of Communication with Friends and Family: More than three times a week    Frequency of Social Gatherings with Friends and Family: More than three times a week    Attends Religious Services: 1 to 4 times per year    Active Member of [employer] or Organizations: Yes    Attends Engineer, structural: More than 4 times per year    Marital Status: Divorced  Intimate Partner Violence: Not At Risk (05/06/2023)   Humiliation, Afraid, Rape, and Kick questionnaire    Fear of Current or Ex-Partner: No    Emotionally Abused: No    Physically Abused: No    Sexually Abused: No      Family History  Problem Relation Age of Onset   Hypertension Mother    Hyperlipidemia Mother    Alzheimer's disease Mother    Vascular Disease Mother    Glaucoma Father    Heart disease Father    Hypertension Father    Diabetes Father    Hyperlipidemia Father    Skin cancer Father    Diabetes Paternal Grandmother    Colon cancer Neg Hx    Esophageal cancer Neg Hx    BP 130/70   Pulse 91   Wt 85.9 kg (189 lb 6.4 oz)   SpO2 96%   BMI 32.51 kg/m   Wt Readings from Last 3 Encounters:  08/06/23 85.9 kg (189 lb 6.4 oz)  07/25/23 86.6 kg (191 lb)  06/26/23 84.9 kg (187 lb 3.2 oz)  Physical Exam General: NAD Neck: No JVD, no thyromegaly or thyroid nodule.  Lungs: Clear to auscultation bilaterally with normal respiratory effort. CV: Nondisplaced PMI.  Heart regular S1/S2, no S3/S4, no murmur.  No peripheral edema.  No carotid bruit.  Normal pedal pulses.  Abdomen: Soft, nontender, no  hepatosplenomegaly, no distention.  Skin: Intact without lesions or rashes.  Neurologic: Alert and oriented x 3.  Psych: Normal affect. Extremities: No clubbing or cyanosis.  HEENT: Normal.   ASSESSMENT & PLAN: 1. CAD: Remote RCA PCI.  NSTEMI in 5/22, LHC showed severe complex stenosis proximal/mid/distal LAD with patent RCA stents.  LAD was unfavorable for PCI with marked tortuosity and small caliber, also poor target for CABG due to small size.  Medical management initially.  Patient had worsening of angina and cardiac PET was done that was high risk.  She then had cath in 9/23 with difficult PCI to proximal LAD with DES and PTCA mid LAD.  There was residual 90% stenosis in the mid/distal LAD that could not be approached due to tortuosity.  She had NSTEMI in 10/23 with repeat cath unchanged from 9/23 with patent stent, 50% mLAD stenosis at site of PTCA, and 90% mid-distal LAD (not approachable percutaneously). Negative nuclear stress test in 6/24 in [place] for atypical chest pain.  No further chest pain.  - No longer on ASA with Eliquis - Continue atorvastatin 80 mg daily + Zetia + Repatha.  Check lipids today.  - Continue atenolol 100 mg daily.  - Continue Imdur 30 mg in the morning and 60 mg in the evening.  - Continue ranolazine 500 mg bid, QTc stable on ECG today.  - Continue Jardiance 10 mg daily, No GU symptoms. - She has been on Plavix for a year post-PCI.  She will continue Eliquis and can stop Plavix.   2. Atrial fibrillation: Paroxysmal.  She is in NSR on ECG today  - Continue Eliquis. 3. HTN: BP controlled.  - Continue current medications. 4. Asthma: Followed by Dr. Isaiah Serge.   Follow up in 4 months with APP.  Marca Ancona  08/06/2023

## 2023-08-12 ENCOUNTER — Other Ambulatory Visit (HOSPITAL_COMMUNITY): Payer: Self-pay

## 2023-08-12 ENCOUNTER — Telehealth: Payer: Self-pay | Admitting: Pharmacy Technician

## 2023-08-12 MED ORDER — REPATHA SURECLICK 140 MG/ML ~~LOC~~ SOAJ: 140.0000 mg | SUBCUTANEOUS | 11 refills | Status: DC

## 2023-08-12 NOTE — Telephone Encounter (Signed)
Called pt to advise her she has active coverage for Repatha but that 420mg  pushtronix has been removed from the market and she will need to change to 140mg  sureclick device. Reviewed injection technique over the phone, she is aware of formulation change.

## 2023-08-12 NOTE — Telephone Encounter (Signed)
Pharmacy Patient Advocate Encounter  Insurance verification completed.    The patient is insured through [employer]/ [employer]   Ran test claim for repatha. Currently a quantity of 2 ML is a 28 day supply and the co-pay is $47.00 .   This test claim was processed through [facility]- copay amounts may vary at other pharmacies due to pharmacy/plan contracts, or as the patient moves through the different stages of their insurance plan.

## 2023-08-12 NOTE — Telephone Encounter (Signed)
-----   Message from [place] sent at 08/09/2023  4:37 PM EDT ----- Please submit new start PA for Repatha 140mg , thanks!

## 2023-08-12 NOTE — Addendum Note (Signed)
Addended by: Kalanie Fewell E on: 08/12/2023 11:20 AM   Modules accepted: Orders

## 2023-09-10 ENCOUNTER — Encounter: Payer: Self-pay | Admitting: Family Medicine

## 2023-09-10 ENCOUNTER — Ambulatory Visit (INDEPENDENT_AMBULATORY_CARE_PROVIDER_SITE_OTHER): Payer: PPO | Admitting: Family Medicine

## 2023-09-10 VITALS — BP 102/45 | HR 65 | Ht 64.0 in | Wt 194.0 lb

## 2023-09-10 DIAGNOSIS — Z23 Encounter for immunization: Secondary | ICD-10-CM | POA: Diagnosis not present

## 2023-09-10 DIAGNOSIS — E039 Hypothyroidism, unspecified: Secondary | ICD-10-CM

## 2023-09-10 DIAGNOSIS — Z7984 Long term (current) use of oral hypoglycemic drugs: Secondary | ICD-10-CM | POA: Diagnosis not present

## 2023-09-10 DIAGNOSIS — E782 Mixed hyperlipidemia: Secondary | ICD-10-CM | POA: Diagnosis not present

## 2023-09-10 DIAGNOSIS — E118 Type 2 diabetes mellitus with unspecified complications: Secondary | ICD-10-CM

## 2023-09-10 DIAGNOSIS — I10 Essential (primary) hypertension: Secondary | ICD-10-CM | POA: Diagnosis not present

## 2023-09-10 LAB — POCT GLYCOSYLATED HEMOGLOBIN (HGB A1C): Hemoglobin A1C: 6.5 % — AB (ref 4.0–5.6)

## 2023-09-10 MED ORDER — EMPAGLIFLOZIN 10 MG PO TABS: 10.0000 mg | ORAL_TABLET | Freq: Every day | ORAL | 4 refills | Status: AC

## 2023-09-10 NOTE — Assessment & Plan Note (Signed)
Lab Results  Component Value Date   HGBA1C 6.5 (A) 09/10/2023   Well controlled.

## 2023-09-10 NOTE — Progress Notes (Unsigned)
Established Patient Office Visit  Subjective   Patient ID: Tara Campbell, female    DOB: August 08, 1946  Age: 77 y.o. MRN: [id number]  No chief complaint on file.   HPI  Diabetes - no hypoglycemic events. No wounds or sores that are not healing well. No increased thirst or urination. Checking glucose at home. Taking medications as prescribed without any side effects.  Hypothyroidism - Taking medication regularly in the AM away from food and vitamins, etc. No recent change to skin, hair, or energy levels.  Follow-up moderate persistent asthma- she had a flare earlier this summer but has been doing well Still wheezing with exercise.     ROS    Objective:     BP (!) 102/45   Pulse 65   Ht 5\' 4"  (1.626 m)   Wt 194 lb (88 kg)   SpO2 98%   BMI 33.30 kg/m     Physical Exam Vitals and nursing note reviewed.  Constitutional:      Appearance: Normal appearance.  HENT:     Head: Normocephalic and atraumatic.  Eyes:     Conjunctiva/sclera: Conjunctivae normal.  Cardiovascular:     Rate and Rhythm: Normal rate and regular rhythm.  Pulmonary:     Effort: Pulmonary effort is normal.     Breath sounds: Normal breath sounds.  Skin:    General: Skin is warm and dry.  Neurological:     Mental Status: She is alert.  Psychiatric:        Mood and Affect: Mood normal.      Results for orders placed or performed in visit on 09/10/23  POCT HgB A1C  Result Value Ref Range   Hemoglobin A1C 6.5 (A) 4.0 - 5.6 %   HbA1c POC (<> result, manual entry)     HbA1c, POC (prediabetic range)     HbA1c, POC (controlled diabetic range)         The ASCVD Risk score (Arnett DK, et al., 2019) failed to calculate for the following reasons:   The patient has a prior MI or stroke diagnosis    Assessment & Plan:   Problem List Items Addressed This Visit       Cardiovascular and Mediastinum   Hypertension goal BP (blood pressure) < 140/90 (Chronic)    BP looks great today.          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        Relevant Medications   empagliflozin (JARDIANCE) 10 MG TABS tablet   Other Relevant Orders   POCT HgB A1C (Completed)   Acquired hypothyroidism    Doing well on current regimen. Asymptomatic.         Other   Hyperlipidemia    Tolerating repatha well.       Other Visit Diagnoses     Encounter for immunization       Relevant Orders   Pneumococcal conjugate vaccine 20-valent (Completed)       Return in about 3 months (around 12/18/2023) for Diabetes follow-up.    Nani Gasser, MD

## 2023-09-10 NOTE — Assessment & Plan Note (Signed)
BP looks great today!! 

## 2023-09-10 NOTE — Assessment & Plan Note (Signed)
Tolerating repatha well.

## 2023-09-10 NOTE — Assessment & Plan Note (Signed)
Doing well on current regimen. Asymptomatic.

## 2023-09-27 ENCOUNTER — Encounter: Payer: Self-pay | Admitting: Family Medicine

## 2023-09-27 MED ORDER — ALPRAZOLAM 0.25 MG PO TABS: 0.2500 mg | ORAL_TABLET | Freq: Two times a day (BID) | ORAL | 0 refills | Status: DC | PRN

## 2023-09-30 ENCOUNTER — Other Ambulatory Visit: Payer: Self-pay | Admitting: Family Medicine

## 2023-09-30 DIAGNOSIS — Z1231 Encounter for screening mammogram for malignant neoplasm of breast: Secondary | ICD-10-CM

## 2023-10-22 ENCOUNTER — Ambulatory Visit (INDEPENDENT_AMBULATORY_CARE_PROVIDER_SITE_OTHER): Payer: PPO | Admitting: Family Medicine

## 2023-10-22 ENCOUNTER — Encounter: Payer: Self-pay | Admitting: Family Medicine

## 2023-10-22 VITALS — BP 128/64 | HR 99 | Temp 98.5°F | Ht 64.0 in | Wt 194.0 lb

## 2023-10-22 DIAGNOSIS — J4541 Moderate persistent asthma with (acute) exacerbation: Secondary | ICD-10-CM | POA: Diagnosis not present

## 2023-10-22 DIAGNOSIS — J22 Unspecified acute lower respiratory infection: Secondary | ICD-10-CM

## 2023-10-22 MED ORDER — AZITHROMYCIN 250 MG PO TABS: ORAL_TABLET | ORAL | 0 refills | Status: AC

## 2023-10-22 MED ORDER — PREDNISONE 20 MG PO TABS
40.0000 mg | ORAL_TABLET | Freq: Every day | ORAL | 0 refills | Status: DC
Start: 2023-10-22 — End: 2023-10-25

## 2023-10-22 NOTE — Progress Notes (Signed)
Acute Office Visit  Subjective:     Patient ID: Tara Campbell, female    DOB: 03/20/1946, 77 y.o.   MRN: [id number]  Chief Complaint  Patient presents with   Cough   Wheezing    HPI Patient is in today for shortness of breath and intermittent wheezing for about 2 weeks.  But in the last 2 days she has developed a productive cough and some sinus congestion.  No sore throat no ear pain.  No GI symptoms.  No fevers or chills.  She did do a home COVID test which was negative yesterday.  ROS      Objective:    BP 128/64   Pulse 99   Temp 98.5 F (36.9 C) (Oral)   Ht 5\' 4"  (1.626 m)   Wt 194 lb (88 kg)   SpO2 98%   BMI 33.30 kg/m    Physical Exam Constitutional:      Appearance: Normal appearance.  HENT:     Head: Normocephalic and atraumatic.     Right Ear: Tympanic membrane, ear canal and external ear normal. There is no impacted cerumen.     Left Ear: Tympanic membrane, ear canal and external ear normal. There is no impacted cerumen.     Nose: Nose normal.     Mouth/Throat:     Pharynx: Oropharynx is clear.  Eyes:     Conjunctiva/sclera: Conjunctivae normal.  Cardiovascular:     Rate and Rhythm: Normal rate and regular rhythm.  Pulmonary:     Effort: Pulmonary effort is normal.     Breath sounds: Wheezing present.     Comments: Expiratory wheezing in the Right upper lung Musculoskeletal:     Cervical back: Neck supple. No tenderness.  Lymphadenopathy:     Cervical: No cervical adenopathy.  Skin:    General: Skin is warm and dry.  Neurological:     Mental Status: She is alert and oriented to person, place, and time.  Psychiatric:        Mood and Affect: Mood normal.     No results found for any visits on 10/22/23.      Assessment & Plan:   Problem List Items Addressed This Visit   None Visit Diagnoses     Lower respiratory infection    -  Primary   Relevant Medications   azithromycin (ZITHROMAX) 250 MG tablet   Moderate persistent asthma  with exacerbation       Relevant Medications   predniSONE (DELTASONE) 20 MG tablet      Respiratory tract infection-we will go ahead and treat with azithromycin to cover for mycoplasma which is in the community currently.  I did hear wheezing discretely in the right upper lung field.  If not improving towards the end of the week then recommend chest x-ray.  Or sooner if she feels like she is getting worse.  Will also treat with a round of prednisone for asthma exacerbation continue to use albuterol every 6 hours as needed and then taper as she is improving.  Meds ordered this encounter  Medications   azithromycin (ZITHROMAX) 250 MG tablet    Sig: 2 Ttabs PO on Day 1, then one a day x 4 days.    Dispense:  6 tablet    Refill:  0   predniSONE (DELTASONE) 20 MG tablet    Sig: Take 2 tablets (40 mg total) by mouth daily with breakfast.    Dispense:  10 tablet    Refill:  0    No follow-ups on file.  I spent 30 minutes on the day of the encounter to include pre-visit record review, face-to-face time with the patient and post visit ordering of test.   Nani Gasser, MD

## 2023-10-22 NOTE — Patient Instructions (Signed)
Call Thur afternoon if not improving

## 2023-10-24 ENCOUNTER — Encounter: Payer: Self-pay | Admitting: Family Medicine

## 2023-10-24 ENCOUNTER — Telehealth: Payer: Self-pay | Admitting: Family Medicine

## 2023-10-24 DIAGNOSIS — J22 Unspecified acute lower respiratory infection: Secondary | ICD-10-CM

## 2023-10-24 NOTE — Addendum Note (Signed)
Addended by: Nani Gasser D on: 10/24/2023 05:25 PM   Modules accepted: Orders

## 2023-10-24 NOTE — Telephone Encounter (Signed)
Patient called she in not feeling any better requesting a chest x-ray and also in both lungs are worse her sister is an [employer] fundraiser and listened to her lungs

## 2023-10-24 NOTE — Telephone Encounter (Signed)
Orders. Sh can come anytime today

## 2023-10-25 ENCOUNTER — Ambulatory Visit (INDEPENDENT_AMBULATORY_CARE_PROVIDER_SITE_OTHER): Payer: PPO | Admitting: Family Medicine

## 2023-10-25 ENCOUNTER — Ambulatory Visit: Payer: PPO

## 2023-10-25 ENCOUNTER — Encounter: Payer: Self-pay | Admitting: Family Medicine

## 2023-10-25 VITALS — BP 127/60 | HR 90

## 2023-10-25 DIAGNOSIS — J22 Unspecified acute lower respiratory infection: Secondary | ICD-10-CM | POA: Diagnosis not present

## 2023-10-25 DIAGNOSIS — R059 Cough, unspecified: Secondary | ICD-10-CM | POA: Diagnosis not present

## 2023-10-25 MED ORDER — AMOXICILLIN-POT CLAVULANATE 875-125 MG PO TABS: 1.0000 | ORAL_TABLET | Freq: Two times a day (BID) | ORAL | 0 refills | Status: DC

## 2023-10-25 MED ORDER — PREDNISONE 20 MG PO TABS: 40.0000 mg | ORAL_TABLET | Freq: Every day | ORAL | 0 refills | Status: DC

## 2023-10-25 NOTE — Progress Notes (Signed)
Pt reports that she is doing better. Her cough is not as bad. She did a nebulizer treatment this morning

## 2023-10-25 NOTE — Telephone Encounter (Signed)
Patient advised.

## 2023-10-25 NOTE — Progress Notes (Signed)
   Acute Office Visit  Subjective:     Patient ID: Tara Campbell, female    DOB: 1946/09/10, 77 y.o.   MRN: [id number]  Chief Complaint  Patient presents with   Cough    HPI Patient is in today for follow-up on recheck on her lungs for lower respiratory tract infection she had called yesterday because she actually felt like she was feeling a little bit worse and her sister who is an Char[employer] fundraiser had listened to her chest and was concerned.  She did come by for her chest x-ray a little bit before hand  ROS      Objective:    BP 127/60   Pulse 90   SpO2 97%    Physical Exam Vitals and nursing note reviewed.  Constitutional:      Appearance: Normal appearance.  HENT:     Head: Normocephalic and atraumatic.  Eyes:     Conjunctiva/sclera: Conjunctivae normal.  Cardiovascular:     Rate and Rhythm: Normal rate and regular rhythm.  Pulmonary:     Effort: Pulmonary effort is normal.     Breath sounds: Normal breath sounds.  Skin:    General: Skin is warm and dry.  Neurological:     Mental Status: She is alert.  Psychiatric:        Mood and Affect: Mood normal.     No results found for any visits on 10/25/23.      Assessment & Plan:   Problem List Items Addressed This Visit   None Visit Diagnoses     Lower respiratory infection    -  Primary      Respiratory tract infection-she actually has pretty good air movement today but she does have some expiratory wheezing it is diffuse upper and lower right and left stones.  I did look over the chest x-ray results myself she was able to go before the appointment today.  I do not see any indication of pneumonia at this point.  Maybe just a slight blunting at the left lower lobe.  But compared to previous films no significant changes.  I did give her another round of prednisone to take if over the next few days she feels like it is getting worse we are getting ready to head into the holiday weekend we will be closed for 2 days.  We  also discussed filling the prescription for second antibiotic again if over the weekend she feels like she is getting worse instead of continuing to improve.  Use nebulizer 2-3 times a day for the next several days and then taper gradually is improving.  Meds ordered this encounter  Medications   predniSONE (DELTASONE) 20 MG tablet    Sig: Take 2 tablets (40 mg total) by mouth daily with breakfast.    Dispense:  10 tablet    Refill:  0   amoxicillin-clavulanate (AUGMENTIN) 875-125 MG tablet    Sig: Take 1 tablet by mouth 2 (two) times daily.    Dispense:  14 tablet    Refill:  0    Return if symptoms worsen or fail to improve.  Nani Gasser, MD

## 2023-10-25 NOTE — Telephone Encounter (Signed)
See if she would be able to go by and get the x-ray little earlier before the appointment that way even if the radiologist has not looked at it I can try to pull it up by the time we have her appointment.  That would be wonderful.

## 2023-11-03 DIAGNOSIS — Z8709 Personal history of other diseases of the respiratory system: Secondary | ICD-10-CM

## 2023-11-03 HISTORY — DX: Personal history of other diseases of the respiratory system: Z87.09

## 2023-11-06 ENCOUNTER — Ambulatory Visit: Payer: PPO

## 2023-11-06 DIAGNOSIS — Z1231 Encounter for screening mammogram for malignant neoplasm of breast: Secondary | ICD-10-CM | POA: Diagnosis not present

## 2023-11-08 NOTE — Progress Notes (Signed)
Hi Tara Campbell,  Your mammo showed a questionable area in your right breast. The imaging dept will contact you soon to schedule additional imagine

## 2023-11-11 ENCOUNTER — Other Ambulatory Visit: Payer: Self-pay | Admitting: Family Medicine

## 2023-11-11 ENCOUNTER — Encounter: Payer: Self-pay | Admitting: Family Medicine

## 2023-11-11 DIAGNOSIS — R928 Other abnormal and inconclusive findings on diagnostic imaging of breast: Secondary | ICD-10-CM

## 2023-11-11 NOTE — Progress Notes (Signed)
Sandy, follow-up chest x-ray is clear.

## 2023-11-12 ENCOUNTER — Encounter: Payer: Self-pay | Admitting: Family Medicine

## 2023-11-12 ENCOUNTER — Ambulatory Visit (INDEPENDENT_AMBULATORY_CARE_PROVIDER_SITE_OTHER): Payer: PPO | Admitting: Family Medicine

## 2023-11-12 VITALS — BP 136/64 | HR 107 | Ht 64.0 in | Wt 194.0 lb

## 2023-11-12 DIAGNOSIS — J4541 Moderate persistent asthma with (acute) exacerbation: Secondary | ICD-10-CM

## 2023-11-12 DIAGNOSIS — R062 Wheezing: Secondary | ICD-10-CM

## 2023-11-12 DIAGNOSIS — J4550 Severe persistent asthma, uncomplicated: Secondary | ICD-10-CM

## 2023-11-12 MED ORDER — AZITHROMYCIN 250 MG PO TABS: ORAL_TABLET | ORAL | 0 refills | Status: AC

## 2023-11-12 MED ORDER — PREDNISONE 20 MG PO TABS
40.0000 mg | ORAL_TABLET | Freq: Every day | ORAL | 0 refills | Status: DC
Start: 2023-11-12 — End: 2023-12-03

## 2023-11-12 NOTE — Telephone Encounter (Signed)
Patient scheduled.

## 2023-11-12 NOTE — Progress Notes (Signed)
Established Patient Office Visit  Subjective   Patient ID: Tara Campbell, female    DOB: 14-Feb-1946  Age: 77 y.o. MRN: [id number]  Chief Complaint  Patient presents with   Wheezing    HPI She reports that she started wheezing again yesterday she has been using her nebulizer treatments.  She is flying out to [place] on Thursday for the Christmas holidays and will be gone for a couple of weeks. Finished her antibiotic last Weds. Just took the prednisone here and there. Didn't take it consecutively.  Still has some leftover in the bottle.  She was really feeling better until yesterday when she started wheezing again she ended up doing 3 separate nebulizer treatments.  There was nothing unusual she stayed at home she was not around any strong perfume smells etc.  She says that she felt better when she got up this morning and has not even had to do another nebulizer treatment.    ROS    Objective:     BP 136/64   Pulse (!) 107   Ht 5\' 4"  (1.626 m)   Wt 194 lb (88 kg)   SpO2 97%   BMI 33.30 kg/m    Physical Exam Constitutional:      Appearance: Normal appearance.  HENT:     Head: Normocephalic and atraumatic.     Right Ear: Tympanic membrane, ear canal and external ear normal. There is no impacted cerumen.     Left Ear: Tympanic membrane, ear canal and external ear normal. There is no impacted cerumen.     Nose: Nose normal.     Mouth/Throat:     Pharynx: Oropharynx is clear.  Eyes:     Conjunctiva/sclera: Conjunctivae normal.  Cardiovascular:     Rate and Rhythm: Normal rate and regular rhythm.  Pulmonary:     Effort: Pulmonary effort is normal.     Breath sounds: Normal breath sounds.  Musculoskeletal:     Cervical back: Neck supple. No tenderness.  Lymphadenopathy:     Cervical: No cervical adenopathy.  Skin:    General: Skin is warm and dry.  Neurological:     Mental Status: She is alert and oriented to person, place, and time.  Psychiatric:        Mood  and Affect: Mood normal.      No results found for any visits on 11/12/23.    The ASCVD Risk score (Arnett DK, et al., 2019) failed to calculate for the following reasons:   The patient has a prior MI or stroke diagnosis    Assessment & Plan:   Problem List Items Addressed This Visit       Respiratory   Severe persistent allergic asthma   Relevant Medications   predniSONE (DELTASONE) 20 MG tablet   Other Visit Diagnoses     Wheezing    -  Primary   Moderate persistent asthma with exacerbation       Relevant Medications   predniSONE (DELTASONE) 20 MG tablet      Wheezing-she actually sounds clear on exam and everything sounds reassuring.  The nebulizer treatments did seem to help yesterday we finally got the final chest x-ray reading a couple of days ago and everything was negative which is reassuring.  We did discuss that since she is gena be gone for 2 weeks I did go ahead and send a refill on the prednisone as well as a prescription for the antibiotic to take if she feels like she is  getting worse since she is flying out of town in 2 days.  If she does end up taking the medication just encouraged her to let me know.  He is gena be able to take her nebulizer with her.  No follow-ups on file.    Nani Gasser, MD

## 2023-11-13 ENCOUNTER — Encounter: Payer: Self-pay | Admitting: Family Medicine

## 2023-11-13 ENCOUNTER — Ambulatory Visit: Payer: PPO

## 2023-11-13 MED ORDER — OSELTAMIVIR PHOSPHATE 75 MG PO CAPS: 75.0000 mg | ORAL_CAPSULE | Freq: Every day | ORAL | 0 refills | Status: DC

## 2023-12-02 ENCOUNTER — Ambulatory Visit
Admission: RE | Admit: 2023-12-02 | Discharge: 2023-12-02 | Disposition: A | Discharge status: Home / Self Care | Payer: PPO | Source: Ambulatory Visit | Attending: Family Medicine | Admitting: Family Medicine

## 2023-12-02 ENCOUNTER — Other Ambulatory Visit: Payer: Self-pay | Admitting: Family Medicine

## 2023-12-02 DIAGNOSIS — N6310 Unspecified lump in the right breast, unspecified quadrant: Secondary | ICD-10-CM

## 2023-12-02 DIAGNOSIS — R928 Other abnormal and inconclusive findings on diagnostic imaging of breast: Secondary | ICD-10-CM

## 2023-12-02 DIAGNOSIS — N6311 Unspecified lump in the right breast, upper outer quadrant: Secondary | ICD-10-CM | POA: Diagnosis not present

## 2023-12-03 ENCOUNTER — Ambulatory Visit
Admission: RE | Admit: 2023-12-03 | Discharge: 2023-12-03 | Disposition: A | Discharge status: Home / Self Care | Payer: PPO | Source: Ambulatory Visit | Attending: Family Medicine | Admitting: Family Medicine

## 2023-12-03 ENCOUNTER — Telehealth: Payer: Self-pay | Admitting: Pulmonary Disease

## 2023-12-03 DIAGNOSIS — Z170 Estrogen receptor positive status [ER+]: Secondary | ICD-10-CM | POA: Diagnosis not present

## 2023-12-03 DIAGNOSIS — N6310 Unspecified lump in the right breast, unspecified quadrant: Secondary | ICD-10-CM

## 2023-12-03 DIAGNOSIS — N6311 Unspecified lump in the right breast, upper outer quadrant: Secondary | ICD-10-CM | POA: Diagnosis not present

## 2023-12-03 DIAGNOSIS — C50411 Malignant neoplasm of upper-outer quadrant of right female breast: Secondary | ICD-10-CM | POA: Diagnosis not present

## 2023-12-03 HISTORY — PX: BREAST BIOPSY: SHX20

## 2023-12-03 MED ORDER — BREZTRI AEROSPHERE 160-9-4.8 MCG/ACT IN AERO: 2.0000 | INHALATION_SPRAY | Freq: Two times a day (BID) | RESPIRATORY_TRACT | 3 refills | Status: AC

## 2023-12-03 MED ORDER — PREDNISONE 10 MG PO TABS: 60.0000 mg | ORAL_TABLET | Freq: Every day | ORAL | 0 refills | Status: DC

## 2023-12-03 NOTE — Telephone Encounter (Signed)
 Spoke to patient's sister, Janice(DPR). Romero stated that pt has been sick for two months with lingering sx. Previously dx with PNA then bronchitis. She has had 3 rounds of abx and 2 rounds of prednisone .  C/o SOB with exertion and prod cough with clear to cloudy sputum.  Denied f/c/s or additional sx.  She is using albuterol  HFA BID, Albuterol  neb BID and Breo once daily. First available with NP is not until 01/07/2024 and with Dr. Theophilus 02/2024.  Dr. Theophilus, please advise. Thanks

## 2023-12-03 NOTE — Telephone Encounter (Signed)
 Called and discussed with patient.   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 She will need surgery with lumpectomy soon C/O heavy mucus with clear/white. Still has cough, wheezing  Already got 3 rounds of antibiotics and 2 rounds of prednisone  Recent Chest x ray does not show infiltrate  Has albuterol , nebs (3-5 times/day) and Breo  Dont think she will need additional antibiotics Will prescribe breztri  and another round of prednisone  Continue albuterol  and nebs as needed.  Mirren Gest MD [facility] See Amion for pager  If no response to pager , please call [phone] until 7pm After 7:00 pm call Elink  [phone] 12/03/2023, 1:13 PM

## 2023-12-03 NOTE — Telephone Encounter (Signed)
 PT has been sick 2 mo. Asking for an appt to be seen. Nothing open. Also, will be having a surgical procedure soon after today's biopsy so will likely need surgical clearance too. Please call to advise.   She has seen her primary care Dr. Sheryl)  At first it was walking pneumonia and then went into bronchitis. 2 rounds of steroids and antibx (zpack) . Just won't go away. Deep cough w/cloudy phlegm. No fever.

## 2023-12-06 LAB — SURGICAL PATHOLOGY

## 2023-12-09 ENCOUNTER — Encounter: Payer: Self-pay | Admitting: *Deleted

## 2023-12-09 ENCOUNTER — Telehealth: Payer: Self-pay | Admitting: *Deleted

## 2023-12-09 DIAGNOSIS — Z170 Estrogen receptor positive status [ER+]: Secondary | ICD-10-CM | POA: Insufficient documentation

## 2023-12-09 DIAGNOSIS — C50411 Malignant neoplasm of upper-outer quadrant of right female breast: Secondary | ICD-10-CM | POA: Insufficient documentation

## 2023-12-09 NOTE — Telephone Encounter (Signed)
 Spoke to patient to confirm upcoming afternoon [facility] clinic appointment on 1/8, paperwork will be sent via email  Gave location and time, also informed patient that the surgeon's office would be calling as well to get information from them similar to the packet that they will be receiving so make sure to do both.  Reminded patient that all providers will be coming to the clinic to see them HERE and if they had any questions to not hesitate to reach back out to myself or their navigators.

## 2023-12-10 NOTE — Progress Notes (Signed)
 Primary Care: Dr Alvan  Primary Cardiologist: Dr Pietro  HF Cardiology: Dr. Rolan Chief Complaint: Heart Failure follow-up  HPI: Tara Campbell is a 78 y.o. with a history of CAD, PAF, Asthma, HTN, DMII. Had previous PCI to RCA.   Admitted 04/2021 with NSTEMI. Had cath that showed severe complex stenosis of her LAD in the proximal mid and distal portion of the vessel.  Patent left circumflex with no significant stenosis, patent left main with no significant stenosis, patent RCA with RCA stents mild nonobstructive disease less than 50% stenosis and normal LVEDP.  Her LAD stenosis was unfavorable for PCI due to heavy calcification and marked tortuosity. There is also noted to be a small caliber vessel with likely poor targets for CABG. Medical therapy was recommended.  Plavix  stopped in 9/22 due to nose bleeds.   She was admitted in 1/23 with ?TIA => neurology evaluated, imaging showed no CVA.  Echo in 1/23 showed EF 60-65%, normal RV, IVC normal.   Patient had markedly positive cardiac PET for anterior ischemia.  She had difficult PCI to proximal LAD in 9/23 with DES and PTCA mid LAD.  There was residual 90% stenosis in the mid/distal LAD that could not be approached due to tortuosity. Patient developed chest pain in 10/23 and went to the ER with NSTEMI, repeat cath was unchanged from 9/23 with patent stent, 50% mLAD stenosis at site of PTCA, and 90% mid-distal LAD (not approachable percutaneously). She had been off Plavix  by accident when this event occurred.  She was treated medically.  She was back in the ER again later in 10/23 with chest pain, troponin was negative.  She was started on ranolazine .   She had motion sickness and pain between her shoulder blades while [place] in [place] in 6[place]24.  Echo showed EF 70% and stress test showed no ischemia. Troponin negative.  Presented to PCP with wheezing and persistent cough. She has since been treated with 3 rounds antibiotics and  2 rounds of prednisone . CXR without infiltrates. She was then seen by Pulm who are treating with albuterol , nebs and breo.  Of note, she did have breast biopsy 12/03/23 showing preliminary cancer. She will need lumpectomy and radiation soon.  Today she returns for HF follow up. Overall feeling good. Denies increasing SOB, CP, dizziness, edema, or PND/Orthopnea. Appetite ok. She needs surgical clearance for lumpectomy with Dr. Ebbie. Also considering breast reduction surgery and has an appointment with Plastic Surgery for consultation, as she is having back pain related to breast size. Taking all medications.   She is cleared for surgery from a heart failure perspective. Okay to hold Eliquis  for procedure.  Labs (1/23): K 3.9, creatinine 0.9, LDL 66 Labs (10/23): K 4.2, creatinine 1.5, LDL 18 Labs (4/24): K 4.3, SCr .99, TSH 2.71, LDL 108, Hgb A1c 6.3 Labs (7/24): K 4.5, creatinine 0.96, hgb 12 Labs (9/24): K 4.1, creatinine 1.15, hgbA1C 6.5, LDL 188 Labs (1/25): K 4.0, creatinine 1.28  PMH: 1. CAD: Remote PCI to RCA.  NSTEMI in 5/22, LHC showed severe complex stenosis proximal/mid/distal LAD with patent RCA stents.  LAD was unfavorable for PCI with marked tortuosity and small caliber, also poor target for CABG due to small size.  Medical management.  - Echo (1/23): EF 60-65%, normal RV, normal IVC.  - LHC (9/23): She had difficult PCI to proximal LAD with DES and PTCA mid LAD.  There was residual 90% stenosis in the mid/distal LAD that could not  be approached due to tortuosity. - NSTEMI (10/23): Repeat cath was unchanged from 9/23 with patent stent, 50% mLAD stenosis at site of PTCA, and 90% mid-distal LAD (not approachable percutaneously). - Stress test in [place] in 6/24 with no ischemia.  2. Atrial fibrillation: Paroxysmal.  3. ?TIA in 1/23: Imaging negative.  4. HTN 5. Type 2 diabetes 6. Asthma 7. ABIs normal in 12/22 8. Gout 9. Hyperlipidemia  Review of Systems: All systems  reviewed and negative except as per HPI.  Current Outpatient Medications  Medication Sig Dispense Refill   acetaminophen  (TYLENOL ) 650 MG CR tablet Take 1 tablet (650 mg total) by mouth every 8 (eight) hours as needed for pain. 90 tablet 3   albuterol  (VENTOLIN  HFA) 108 (90 Base) MCG/ACT inhaler Inhale 2 puffs into the lungs every 6 (six) hours as needed for wheezing. 18 g 5   allopurinol  (ZYLOPRIM ) 300 MG tablet Take 300 mg by mouth daily.     ALPRAZolam  (XANAX ) 0.25 MG tablet Take 1 tablet (0.25 mg total) by mouth 2 (two) times daily as needed for anxiety. 20 tablet 0   apixaban  (ELIQUIS ) 5 MG TABS tablet Take 1 tablet (5 mg total) by mouth 2 (two) times daily. 180 tablet 3   atenolol  (TENORMIN ) 100 MG tablet Take 1 tablet (100 mg total) by mouth daily. 90 tablet 1   atorvastatin  (LIPITOR ) 80 MG tablet Take 1 tablet (80 mg total) by mouth at bedtime. 90 tablet 3   Budeson-Glycopyrrol-Formoterol  (BREZTRI  AEROSPHERE) 160-9-4.8 MCG/ACT AERO Inhale 2 puffs into the lungs in the morning and at bedtime. 1 each 3   cyanocobalamin  (VITAMIN B12) 1000 MCG tablet Take 1,000 mcg by mouth daily.     empagliflozin  (JARDIANCE ) 10 MG TABS tablet Take 1 tablet (10 mg total) by mouth daily before breakfast. 100 tablet 4   EPINEPHrine  0.3 mg/0.3 mL IJ SOAJ injection Inject 0.3 mg into the muscle as needed for anaphylaxis. Call 9-1-1 after use. 1 each 2   Evolocumab  (REPATHA  SURECLICK) 140 MG/ML SOAJ Inject 140 mg into the skin every 14 (fourteen) days. 2 mL 11   ezetimibe  (ZETIA ) 10 MG tablet Take 1 tablet (10 mg total) by mouth daily. 90 tablet 3   fluticasone  (FLONASE ) 50 MCG/ACT nasal spray Place 1 spray into both nostrils daily. 16 g 3   fluticasone  furoate-vilanterol (BREO ELLIPTA ) 200-25 MCG/ACT AEPB INHALE 1 PUFF INTO THE lungs DAILY 60 each 5   glucose blood test strip To be used twice daily for testing blood sugars. E11.65 200 each 11   ipratropium (ATROVENT ) 0.03 % nasal spray Place 2 sprays into both  nostrils every 12 (twelve) hours. 30 mL 0   ipratropium-albuterol  (DUONEB) 0.5-2.5 (3) MG/3ML SOLN Take 3 mLs by nebulization every 6 (six) hours as needed. 3 mL PRN   irbesartan  (AVAPRO ) 300 MG tablet Take 150 mg by mouth daily.     isosorbide  mononitrate (IMDUR ) 60 MG 24 hr tablet Patient takes 0.5 tablet in the morning by mouth and 1 tablet in the evening.     levothyroxine  (SYNTHROID ) 125 MCG tablet Take 1 tablet (125 mcg total) by mouth daily before breakfast. 90 tablet 0   Mepolizumab  ([name] ) 100 MG/ML SOAJ Inject 1 mL (100 mg total) into the skin every 28 (twenty-eight) days. 1 mL 4   metFORMIN  (GLUCOPHAGE ) 500 MG tablet Take 2 tablets (1,000 mg total) by mouth 2 (two) times daily with a meal. 180 tablet 3   nitroGLYCERIN  (NITROSTAT ) 0.4 MG SL tablet Place 1 tablet (  0.4 mg total) under the tongue every 5 (five) minutes as needed for chest pain (or tightness). 25 tablet PRN   Omega-3 Fatty Acids (FISH OIL ) 1000 MG CAPS Take 1 capsule (1,000 mg total) by mouth daily. 90 capsule 3   Polyvinyl Alcohol-Povidone (REFRESH OP) Place 1 drop into both eyes daily as needed (dry eyes).     predniSONE  (DELTASONE ) 10 MG tablet Take 6 tablets (60 mg total) by mouth daily with breakfast. Take 60 mg/day on day 1 and reduce dose by 10 mg every 2 days until taper is complete 50 tablet 0   ranolazine  (RANEXA ) 500 MG 12 hr tablet Take 1 tablet (500 mg total) by mouth 2 (two) times daily. 60 tablet 5   Vitamin D , Cholecalciferol , 25 MCG (1000 UT) TABS Take 25 mcg by mouth daily in the afternoon. 60 tablet    zinc  gluconate 50 MG tablet Take 50 mg by mouth at bedtime.     No current facility-administered medications for this encounter.    Allergies  Allergen Reactions   Sulfamethoxazole Rash    HIVES   Sertraline  Other (See Comments)    Nausea/vomiting   Sulfa Antibiotics Rash   Tape Rash    Vinyl;tape    Social History   Socioeconomic History   Marital status: Divorced    Spouse name: Not on file    Number of children: 2   Years of education: 13.5   Highest education level: Some college, no degree  Occupational History    Comment: retired  Tobacco Use   Smoking status: Never   Smokeless tobacco: Never  Vaping Use   Vaping status: Never Used  Substance and Sexual Activity   Alcohol use: Yes    Comment: Rare   Drug use: No   Sexual activity: Not Currently  Other Topics Concern   Not on file  Social History Narrative   Lives alone. Likes to crafts and paint in her free time.      Right Handed    Lives in a one story home    Social Drivers of Health   Financial Resource Strain: Low Risk  (05/05/2023)   Overall Financial Resource Strain (CARDIA)    Difficulty of Paying Living Expenses: Not very hard  Food Insecurity: No Food Insecurity (12/11/2023)   Hunger Vital Sign    Worried About Running Out of Food in the Last Year: Never true    Ran Out of Food in the Last Year: Never true  Transportation Needs: No Transportation Needs (12/11/2023)   PRAPARE - Administrator, Civil Service (Medical): No    Lack of Transportation (Non-Medical): No  Physical Activity: Insufficiently Active (05/05/2023)   Exercise Vital Sign    Days of Exercise per Week: 1 day    Minutes of Exercise per Session: 10 min  Stress: No Stress Concern Present (05/05/2023)   [employer] of Occupational Health - Occupational Stress Questionnaire    Feeling of Stress : Only a little  Social Connections: Moderately Integrated (05/06/2023)   Social Connection and Isolation Panel [NHANES]    Frequency of Communication with Friends and Family: More than three times a week    Frequency of Social Gatherings with Friends and Family: More than three times a week    Attends Religious Services: 1 to 4 times per year    Active Member of [employer] or Organizations: Yes    Attends Banker Meetings: More than 4 times per year    Marital  Status: Divorced  Catering Manager Violence: Not At Risk  (12/11/2023)   Humiliation, Afraid, Rape, and Kick questionnaire    Fear of Current or Ex-Partner: No    Emotionally Abused: No    Physically Abused: No    Sexually Abused: No      Family History  Problem Relation Age of Onset   Hypertension Mother    Hyperlipidemia Mother    Alzheimer's disease Mother    Vascular Disease Mother    Glaucoma Father    Heart disease Father    Hypertension Father    Diabetes Father    Hyperlipidemia Father    Skin cancer Father    Diabetes Paternal Grandmother    Colon cancer Neg Hx    Esophageal cancer Neg Hx    BP 130/68   Pulse 82   Wt 87.9 kg (193 lb 12.8 oz)   SpO2 98%   BMI 33.27 kg/m   Wt Readings from Last 3 Encounters:  12/12/23 87.9 kg (193 lb 12.8 oz)  12/11/23 87.1 kg (192 lb 1.6 oz)  11/12/23 88 kg (194 lb)   Physical Exam General: Well appearing. No distress on RA HEENT: neck supple.   Cardiac: JVP not visible. S1 and S2 present. No murmurs or rub. Resp: Lung sounds clear and equal B/L Abdomen: Soft, non-tender, non-distended. + BS. Extremities: Warm and dry. No rash, cyanosis.  No edema.  Neuro: Alert and oriented x3. Affect pleasant. Moves all extremities without difficulty.  ASSESSMENT & PLAN: 1. CAD: Remote RCA PCI.  NSTEMI in 5/22, LHC showed severe complex stenosis proximal/mid/distal LAD with patent RCA stents.  LAD was unfavorable for PCI with marked tortuosity and small caliber, also poor target for CABG due to small size.  Medical management initially.  Patient had worsening of angina and cardiac PET was done that was high risk.  She then had cath in 9/23 with difficult PCI to proximal LAD with DES and PTCA mid LAD.  There was residual 90% stenosis in the mid/distal LAD that could not be approached due to tortuosity.  She had NSTEMI in 10/23 with repeat cath unchanged from 9/23 with patent stent, 50% mLAD stenosis at site of PTCA, and 90% mid-distal LAD (not approachable percutaneously). Negative nuclear stress test  in 6/24 in [place] for atypical chest pain.  No further chest pain. NYHA I. - Completed 1 year of Plavix  post-PCI. Continue Eliquis . No ASA with Eliquis  - Continue atorvastatin  80 mg daily + Zetia  + Repatha . LDL 188 (9/24). Refer to [facility] Clinic today for further management. - Continue atenolol  100 mg daily.  - Continue Imdur  30 mg in the morning and 60 mg in the evening.  - Continue ranolazine  500 mg bid - Continue Jardiance  10 mg daily, No GU symptoms. 2. Atrial fibrillation: Paroxysmal.  - Normal rhythm on exam - Continue Eliquis . 3. HTN: BP controlled.  - Continue current medications. 4. Asthma: Followed by Dr. Theophilus.  5. Breast Cancer: biopsy 12/24 + for preliminary cancer. Will need lumpectomy +/- adjuvant radiation therapy + adjuvant antiestrogen therapy - Cleared for surgery from a heart failure perspective. Okay to hold Eliquis  for procedure. - undergoing genetic counseling  Follow up in 4 months with Dr. McLean  Mailyn Steichen, NP 12/12/2023

## 2023-12-11 ENCOUNTER — Inpatient Hospital Stay: Payer: Medicare Other

## 2023-12-11 ENCOUNTER — Other Ambulatory Visit: Payer: Self-pay | Admitting: General Surgery

## 2023-12-11 ENCOUNTER — Inpatient Hospital Stay: Payer: PPO | Attending: Hematology and Oncology | Admitting: Hematology and Oncology

## 2023-12-11 ENCOUNTER — Ambulatory Visit: Payer: Self-pay | Admitting: Physical Therapy

## 2023-12-11 ENCOUNTER — Inpatient Hospital Stay (HOSPITAL_BASED_OUTPATIENT_CLINIC_OR_DEPARTMENT_OTHER): Payer: Medicare Other | Admitting: Genetic Counselor

## 2023-12-11 ENCOUNTER — Encounter: Payer: Self-pay | Admitting: *Deleted

## 2023-12-11 ENCOUNTER — Encounter: Payer: Self-pay | Admitting: General Practice

## 2023-12-11 ENCOUNTER — Ambulatory Visit
Admission: RE | Admit: 2023-12-11 | Discharge: 2023-12-11 | Disposition: A | Discharge status: Home / Self Care | Payer: PPO | Source: Ambulatory Visit | Attending: Radiation Oncology | Admitting: Radiation Oncology

## 2023-12-11 VITALS — BP 113/55 | HR 88 | Temp 97.5°F | Resp 18 | Ht 64.0 in | Wt 192.1 lb

## 2023-12-11 DIAGNOSIS — E039 Hypothyroidism, unspecified: Secondary | ICD-10-CM | POA: Diagnosis not present

## 2023-12-11 DIAGNOSIS — J45909 Unspecified asthma, uncomplicated: Secondary | ICD-10-CM | POA: Insufficient documentation

## 2023-12-11 DIAGNOSIS — Z170 Estrogen receptor positive status [ER+]: Secondary | ICD-10-CM

## 2023-12-11 DIAGNOSIS — E785 Hyperlipidemia, unspecified: Secondary | ICD-10-CM | POA: Diagnosis not present

## 2023-12-11 DIAGNOSIS — E119 Type 2 diabetes mellitus without complications: Secondary | ICD-10-CM | POA: Insufficient documentation

## 2023-12-11 DIAGNOSIS — Z8719 Personal history of other diseases of the digestive system: Secondary | ICD-10-CM | POA: Insufficient documentation

## 2023-12-11 DIAGNOSIS — M109 Gout, unspecified: Secondary | ICD-10-CM | POA: Insufficient documentation

## 2023-12-11 DIAGNOSIS — I10 Essential (primary) hypertension: Secondary | ICD-10-CM | POA: Diagnosis not present

## 2023-12-11 DIAGNOSIS — Z8349 Family history of other endocrine, nutritional and metabolic diseases: Secondary | ICD-10-CM | POA: Insufficient documentation

## 2023-12-11 DIAGNOSIS — C50411 Malignant neoplasm of upper-outer quadrant of right female breast: Secondary | ICD-10-CM | POA: Diagnosis not present

## 2023-12-11 DIAGNOSIS — I480 Paroxysmal atrial fibrillation: Secondary | ICD-10-CM | POA: Insufficient documentation

## 2023-12-11 DIAGNOSIS — Z79890 Hormone replacement therapy (postmenopausal): Secondary | ICD-10-CM | POA: Diagnosis not present

## 2023-12-11 DIAGNOSIS — Z818 Family history of other mental and behavioral disorders: Secondary | ICD-10-CM | POA: Insufficient documentation

## 2023-12-11 DIAGNOSIS — Z882 Allergy status to sulfonamides status: Secondary | ICD-10-CM | POA: Diagnosis not present

## 2023-12-11 DIAGNOSIS — Z79899 Other long term (current) drug therapy: Secondary | ICD-10-CM | POA: Insufficient documentation

## 2023-12-11 DIAGNOSIS — N6310 Unspecified lump in the right breast, unspecified quadrant: Secondary | ICD-10-CM | POA: Insufficient documentation

## 2023-12-11 DIAGNOSIS — Z860100 Personal history of colon polyps, unspecified: Secondary | ICD-10-CM | POA: Diagnosis not present

## 2023-12-11 DIAGNOSIS — Z83511 Family history of glaucoma: Secondary | ICD-10-CM | POA: Insufficient documentation

## 2023-12-11 DIAGNOSIS — I252 Old myocardial infarction: Secondary | ICD-10-CM | POA: Insufficient documentation

## 2023-12-11 DIAGNOSIS — Z83438 Family history of other disorder of lipoprotein metabolism and other lipidemia: Secondary | ICD-10-CM | POA: Diagnosis not present

## 2023-12-11 DIAGNOSIS — Z7183 Encounter for nonprocreative genetic counseling: Secondary | ICD-10-CM

## 2023-12-11 DIAGNOSIS — Z833 Family history of diabetes mellitus: Secondary | ICD-10-CM | POA: Diagnosis not present

## 2023-12-11 DIAGNOSIS — Z8249 Family history of ischemic heart disease and other diseases of the circulatory system: Secondary | ICD-10-CM | POA: Insufficient documentation

## 2023-12-11 DIAGNOSIS — Z7901 Long term (current) use of anticoagulants: Secondary | ICD-10-CM | POA: Insufficient documentation

## 2023-12-11 LAB — CBC WITH DIFFERENTIAL (CANCER CENTER ONLY)
Abs Immature Granulocytes: 0.09 10*3/uL — ABNORMAL HIGH (ref 0.00–0.07)
Basophils Absolute: 0 10*3/uL (ref 0.0–0.1)
Basophils Relative: 0 %
Eosinophils Absolute: 0 10*3/uL (ref 0.0–0.5)
Eosinophils Relative: 0 %
HCT: 36.8 % (ref 36.0–46.0)
Hemoglobin: 12.5 g/dL (ref 12.0–15.0)
Immature Granulocytes: 1 %
Lymphocytes Relative: 6 %
Lymphs Abs: 1.1 10*3/uL (ref 0.7–4.0)
MCH: 29.7 pg (ref 26.0–34.0)
MCHC: 34 g/dL (ref 30.0–36.0)
MCV: 87.4 fL (ref 80.0–100.0)
Monocytes Absolute: 0.4 10*3/uL (ref 0.1–1.0)
Monocytes Relative: 2 %
Neutro Abs: 14.7 10*3/uL — ABNORMAL HIGH (ref 1.7–7.7)
Neutrophils Relative %: 91 %
Platelet Count: 371 10*3/uL (ref 150–400)
RBC: 4.21 MIL/uL (ref 3.87–5.11)
RDW: 13.8 % (ref 11.5–15.5)
WBC Count: 16.3 10*3/uL — ABNORMAL HIGH (ref 4.0–10.5)
nRBC: 0 % (ref 0.0–0.2)

## 2023-12-11 LAB — CMP (CANCER CENTER ONLY)
ALT: 15 U/L (ref 0–44)
AST: 11 U/L — ABNORMAL LOW (ref 15–41)
Albumin: 3.9 g/dL (ref 3.5–5.0)
Alkaline Phosphatase: 81 U/L (ref 38–126)
Anion gap: 9 (ref 5–15)
BUN: 20 mg/dL (ref 8–23)
CO2: 24 mmol/L (ref 22–32)
Calcium: 9.5 mg/dL (ref 8.9–10.3)
Chloride: 104 mmol/L (ref 98–111)
Creatinine: 1.28 mg/dL — ABNORMAL HIGH (ref 0.44–1.00)
GFR, Estimated: 43 mL/min — ABNORMAL LOW (ref >60)
Glucose, Bld: 279 mg/dL — ABNORMAL HIGH (ref 70–99)
Potassium: 4 mmol/L (ref 3.5–5.1)
Sodium: 137 mmol/L (ref 135–145)
Total Bilirubin: 0.4 mg/dL (ref 0.0–1.2)
Total Protein: 6.3 g/dL — ABNORMAL LOW (ref 6.5–8.1)

## 2023-12-11 LAB — GENETIC SCREENING ORDER

## 2023-12-11 NOTE — Progress Notes (Signed)
 [facility] CONSULT NOTE  Patient Care Team: Alvan Dorothyann BIRCH, MD as PCP - General (Family Medicine) Rolan Ezra RAMAN, MD as PCP - Advanced Heart Failure (Cardiology) Alaine Vicenta NOVAK, MD as Consulting Physician (Pulmonary Disease) Pietro Redell RAMAN, MD as Consulting Physician (Cardiology) Beverli Cara PARAS, [facility] (Inactive) as Pharmacist (Pharmacist) Patel, Donika K, DO as Consulting Physician (Neurology) Lbcardiology, Rande, MD as Rounding Team (Cardiology) Tyree Nanetta SAILOR, RN as Oncology Nurse Navigator Glean Stephane BROCKS, RN as Oncology Nurse Navigator Ebbie Cough, MD as Consulting Physician (General Surgery) Odean Potts, MD as Consulting Physician (Hematology and Oncology) Dewey Rush, MD as Consulting Physician (Radiation Oncology)  CHIEF COMPLAINTS/PURPOSE OF CONSULTATION:  Newly diagnosed breast cancer  HISTORY OF PRESENTING ILLNESS:   Tara Campbell is a 78 year old with the screening mammogram detected right breast mass at 10 o'clock position measuring 0.8 cm by ultrasound.  Axilla is negative.  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  Patient was presented this morning to the multidisciplinary tumor board and she is here today accompanied by her daughter to discuss treatment plan.   I reviewed her records extensively and collaborated the history with the patient.  SUMMARY OF ONCOLOGIC HISTORY: Oncology History  Malignant neoplasm of upper-outer quadrant of right breast in female, estrogen receptor positive (HCC)  12/03/2023 Initial Diagnosis   Screening mammogram detected right breast mass at 10 o'clock position measuring 0.8 cm, axilla negative, ultrasound-guided biopsy revealed grade 2 IDC with DCIS ER 95%, PR 20%, HER2 negative, Ki-67 10%   12/11/2023 Cancer Staging   Staging form: Breast, AJCC 8th Edition - Clinical stage from 12/11/2023: Stage IA (cT1b, cN0, cM0, G2,  ER+, PR+, HER2-) - Signed by Lanell Donald Stagger, PA-C on 12/11/2023 Stage prefix: Initial diagnosis Method of lymph node assessment: Clinical Histologic grading system: 3 grade system      MEDICAL HISTORY:  Past Medical History:  Diagnosis Date   Allergy    Arthritis    Asthma    Colon polyps    Coronary artery disease    Diabetes mellitus without complication (HCC)    Gout    Heart attack (HCC) 07/30/2016   PCI, 2 stents   Hyperlipidemia    Hypertension    Hypothyroidism    Internal hemorrhoids    Left rotator cuff tear    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   Otomycosis of right ear 09/27/2017   Paroxysmal atrial fibrillation (HCC)    Thyroid  disease    Trochanteric bursitis of right hip 05/29/2017    SURGICAL HISTORY: Past Surgical History:  Procedure Laterality Date   ANGIOPLASTY     BREAST BIOPSY Right 12/03/2023   US  RT BREAST BX W LOC DEV 1ST LESION IMG BX SPEC US  GUIDE 12/03/2023 [facility] MAMMOGRAPHY   CARDIAC CATHETERIZATION     COLONOSCOPY W/ POLYPECTOMY  01/04/2015   CORONARY STENT INTERVENTION N/A 08/31/2022   Procedure: CORONARY STENT INTERVENTION;  Surgeon: Wonda Sharper, MD;  Location: [facility] INVASIVE CV LAB;  Service: Cardiovascular;  Laterality: N/A;   HEMORRHOID BANDING     LEFT HEART CATH AND CORONARY ANGIOGRAPHY N/A 04/26/2021   Procedure: LEFT HEART CATH AND CORONARY ANGIOGRAPHY;  Surgeon: Wonda Sharper, MD;  Location: [facility] INVASIVE CV LAB;  Service: Cardiovascular;  Laterality: N/A;   LEFT HEART CATH AND CORONARY ANGIOGRAPHY N/A 09/14/2022   Procedure: LEFT HEART CATH AND CORONARY ANGIOGRAPHY;  Surgeon: Wonda Sharper, MD;  Location: [facility] INVASIVE CV LAB;  Service: Cardiovascular;  Laterality: N/A;    SOCIAL HISTORY: Social History   Socioeconomic History   Marital status: Divorced    Spouse name: Not on file   Number of children: 2   Years of education: 13.5   Highest education level: Some college, no degree  Occupational History    Comment:  retired  Tobacco Use   Smoking status: Never   Smokeless tobacco: Never  Vaping Use   Vaping status: Never Used  Substance and Sexual Activity   Alcohol use: Yes    Comment: Rare   Drug use: No   Sexual activity: Not Currently  Other Topics Concern   Not on file  Social History Narrative   Lives alone. Likes to crafts and paint in her free time.      Right Handed    Lives in a one story home    Social Drivers of Health   Financial Resource Strain: Low Risk  (05/05/2023)   Overall Financial Resource Strain (CARDIA)    Difficulty of Paying Living Expenses: Not very hard  Food Insecurity: No Food Insecurity (05/05/2023)   Hunger Vital Sign    Worried About Running Out of Food in the Last Year: Never true    Ran Out of Food in the Last Year: Never true  Transportation Needs: No Transportation Needs (05/05/2023)   PRAPARE - Administrator, Civil Service (Medical): No    Lack of Transportation (Non-Medical): No  Physical Activity: Insufficiently Active (05/05/2023)   Exercise Vital Sign    Days of Exercise per Week: 1 day    Minutes of Exercise per Session: 10 min  Stress: No Stress Concern Present (05/05/2023)   [employer] of Occupational Health - Occupational Stress Questionnaire    Feeling of Stress : Only a little  Social Connections: Moderately Integrated (05/06/2023)   Social Connection and Isolation Panel [NHANES]    Frequency of Communication with Friends and Family: More than three times a week    Frequency of Social Gatherings with Friends and Family: More than three times a week    Attends Religious Services: 1 to 4 times per year    Active Member of [employer] or Organizations: Yes    Attends Engineer, Structural: More than 4 times per year    Marital Status: Divorced  Intimate Partner Violence: Not At Risk (05/06/2023)   Humiliation, Afraid, Rape, and Kick questionnaire    Fear of Current or Ex-Partner: No    Emotionally Abused: No    Physically  Abused: No    Sexually Abused: No    FAMILY HISTORY: Family History  Problem Relation Age of Onset   Hypertension Mother    Hyperlipidemia Mother    Alzheimer's disease Mother    Vascular Disease Mother    Glaucoma Father    Heart disease Father    Hypertension Father    Diabetes Father    Hyperlipidemia Father    Skin cancer Father    Diabetes Paternal Grandmother    Colon cancer Neg Hx    Esophageal cancer Neg Hx     ALLERGIES:  is allergic to sulfamethoxazole, sertraline , sulfa antibiotics, and tape.  MEDICATIONS:  Current Outpatient Medications  Medication Sig Dispense Refill   allopurinol  (ZYLOPRIM ) 300 MG tablet Take 300 mg by mouth daily.     ALPRAZolam  (XANAX ) 0.25 MG tablet Take 1 tablet (0.25 mg total) by mouth 2 (two) times daily as needed for anxiety. 20 tablet 0   apixaban  (ELIQUIS )  5 MG TABS tablet Take 1 tablet (5 mg total) by mouth 2 (two) times daily. 180 tablet 3   atenolol  (TENORMIN ) 100 MG tablet Take 1 tablet (100 mg total) by mouth daily. 90 tablet 1   atorvastatin  (LIPITOR ) 80 MG tablet Take 1 tablet (80 mg total) by mouth at bedtime. 90 tablet 3   Budeson-Glycopyrrol-Formoterol  (BREZTRI  AEROSPHERE) 160-9-4.8 MCG/ACT AERO Inhale 2 puffs into the lungs in the morning and at bedtime. 1 each 3   levothyroxine  (SYNTHROID ) 125 MCG tablet Take 1 tablet (125 mcg total) by mouth daily before breakfast. 90 tablet 0   acetaminophen  (TYLENOL ) 650 MG CR tablet Take 1 tablet (650 mg total) by mouth every 8 (eight) hours as needed for pain. (Patient not taking: Reported on 12/11/2023) 90 tablet 3   albuterol  (VENTOLIN  HFA) 108 (90 Base) MCG/ACT inhaler Inhale 2 puffs into the lungs every 6 (six) hours as needed for wheezing. (Patient not taking: Reported on 12/11/2023) 18 g 5   cyanocobalamin  (VITAMIN B12) 1000 MCG tablet Take 1,000 mcg by mouth daily.     empagliflozin  (JARDIANCE ) 10 MG TABS tablet Take 1 tablet (10 mg total) by mouth daily before breakfast. 100 tablet 4    EPINEPHrine  0.3 mg/0.3 mL IJ SOAJ injection Inject 0.3 mg into the muscle as needed for anaphylaxis. Call 9-1-1 after use. 1 each 2   Evolocumab  (REPATHA  SURECLICK) 140 MG/ML SOAJ Inject 140 mg into the skin every 14 (fourteen) days. 2 mL 11   ezetimibe  (ZETIA ) 10 MG tablet Take 1 tablet (10 mg total) by mouth daily. 90 tablet 3   fluticasone  (FLONASE ) 50 MCG/ACT nasal spray Place 1 spray into both nostrils daily. 16 g 3   fluticasone  furoate-vilanterol (BREO ELLIPTA ) 200-25 MCG/ACT AEPB INHALE 1 PUFF INTO THE lungs DAILY 60 each 5   glucose blood test strip To be used twice daily for testing blood sugars. E11.65 200 each 11   ipratropium (ATROVENT ) 0.03 % nasal spray Place 2 sprays into both nostrils every 12 (twelve) hours. 30 mL 0   ipratropium-albuterol  (DUONEB) 0.5-2.5 (3) MG/3ML SOLN Take 3 mLs by nebulization every 6 (six) hours as needed. 3 mL PRN   irbesartan  (AVAPRO ) 300 MG tablet Take 300 mg by mouth daily.     isosorbide  mononitrate (IMDUR ) 60 MG 24 hr tablet Patient takes 0.5 tablet in the morning by mouth and 1 tablet in the evening.     Mepolizumab  ([name] ) 100 MG/ML SOAJ Inject 1 mL (100 mg total) into the skin every 28 (twenty-eight) days. (Patient not taking: Reported on 12/11/2023) 1 mL 4   metFORMIN  (GLUCOPHAGE ) 500 MG tablet Take 2 tablets (1,000 mg total) by mouth 2 (two) times daily with a meal. (Patient not taking: Reported on 12/11/2023) 180 tablet 3   nitroGLYCERIN  (NITROSTAT ) 0.4 MG SL tablet Place 1 tablet (0.4 mg total) under the tongue every 5 (five) minutes as needed for chest pain (or tightness). (Patient not taking: Reported on 12/11/2023) 25 tablet PRN   Omega-3 Fatty Acids (FISH OIL ) 1000 MG CAPS Take 1 capsule (1,000 mg total) by mouth daily. (Patient not taking: Reported on 12/11/2023) 90 capsule 3   oseltamivir  (TAMIFLU ) 75 MG capsule Take 1 capsule (75 mg total) by mouth daily. (Patient not taking: Reported on 12/11/2023) 10 capsule 0   Polyvinyl Alcohol-Povidone (REFRESH  OP) Place 1 drop into both eyes daily as needed (dry eyes). (Patient not taking: Reported on 12/11/2023)     predniSONE  (DELTASONE ) 10 MG tablet Take 6 tablets (  60 mg total) by mouth daily with breakfast. Take 60 mg/day on day 1 and reduce dose by 10 mg every 2 days until taper is complete (Patient not taking: Reported on 12/11/2023) 50 tablet 0   ranolazine  (RANEXA ) 500 MG 12 hr tablet Take 1 tablet (500 mg total) by mouth 2 (two) times daily. (Patient not taking: Reported on 12/11/2023) 60 tablet 5   Vitamin D , Cholecalciferol , 25 MCG (1000 UT) TABS Take 25 mcg by mouth daily in the afternoon. (Patient not taking: Reported on 12/11/2023) 60 tablet    zinc  gluconate 50 MG tablet Take 50 mg by mouth at bedtime. (Patient not taking: Reported on 12/11/2023)     No current facility-administered medications for this visit.    REVIEW OF SYSTEMS:   Constitutional: Denies fevers, chills or abnormal night sweats Breast:  Denies any palpable lumps or discharge All other systems were reviewed with the patient and are negative.  PHYSICAL EXAMINATION: ECOG PERFORMANCE STATUS: 1 - Symptomatic but completely ambulatory  Vitals:   12/11/23 1245  BP: (!) 113/55  Pulse: 88  Resp: 18  Temp: (!) 97.5 F (36.4 C)  SpO2: 99%   Filed Weights   12/11/23 1245  Weight: 192 lb 1.6 oz (87.1 kg)    GENERAL:alert, no distress and comfortable  LABORATORY DATA:  I have reviewed the data as listed Lab Results  Component Value Date   WBC 16.3 (H) 12/11/2023   HGB 12.5 12/11/2023   HCT 36.8 12/11/2023   MCV 87.4 12/11/2023   PLT 371 12/11/2023   Lab Results  Component Value Date   NA 137 12/11/2023   K 4.0 12/11/2023   CL 104 12/11/2023   CO2 24 12/11/2023    RADIOGRAPHIC STUDIES: I have personally reviewed the radiological reports and agreed with the findings in the report.  ASSESSMENT AND PLAN:  Malignant neoplasm of upper-outer quadrant of right breast in female, estrogen receptor positive  (HCC) 12/03/2023:Screening mammogram detected right breast mass at 10 o'clock position measuring 0.8 cm, axilla negative, ultrasound-guided biopsy revealed grade 2 IDC with DCIS ER 95%, PR 20%, HER2 negative, Ki-67 10%  Pathology and radiology counseling:Discussed with the patient, the details of pathology including the type of breast cancer,the clinical staging, the significance of ER, PR and HER-2/neu receptors and the implications for treatment. After reviewing the pathology in detail, we proceeded to discuss the different treatment options between surgery, radiation, chemotherapy, antiestrogen therapies.  Recommendations: 1. Breast conserving surgery followed by 2. +/-adjuvant radiation therapy followed by 3. Adjuvant antiestrogen therapy  Return to clinic after surgery to discuss final pathology report   All questions were answered. The patient knows to call the clinic with any problems, questions or concerns.    Viinay K Zohar Laing, MD 12/11/23

## 2023-12-11 NOTE — Progress Notes (Signed)
 [facility] Multidisciplinary Clinic Spiritual Care Note  Met with Jerome Otter and her sister Romero in Breast Multidisciplinary Clinic to introduce Support Center team/resources.  she completed SDOH screening; results follow below.  SDOH Interventions    Flowsheet Row Telephone from 09/18/2022 in [facility] Care Coordination Office Visit from 09/10/2022 in [facility] Primary Care & Sports Medicine at [facility] Office Visit from 04/06/2021 in [facility] Primary Care & Sports Medicine at [facility]  SDOH Interventions     Food Insecurity Interventions -- -- Intervention Not Indicated  Housing Interventions -- -- Intervention Not Indicated  Transportation Interventions -- -- Intervention Not Indicated  Utilities Interventions Intervention Not Indicated -- --  Depression Interventions/Treatment  -- Counseling --  Financial Strain Interventions -- -- Intervention Not Indicated  Physical Activity Interventions -- -- Intervention Not Indicated  Stress Interventions -- -- Intervention Not Indicated  Social Connections Interventions -- -- Intervention Not Indicated       SDOH Screenings   Food Insecurity: No Food Insecurity (12/11/2023)  Housing: Low Risk  (12/11/2023)  Transportation Needs: No Transportation Needs (12/11/2023)  Utilities: Not At Risk (12/11/2023)  Alcohol Screen: Low Risk  (05/06/2023)  Depression (PHQ2-9): Low Risk  (09/10/2023)  Financial Resource Strain: Low Risk  (05/05/2023)  Physical Activity: Insufficiently Active (05/05/2023)  Social Connections: Moderately Integrated (05/06/2023)  Stress: No Stress Concern Present (05/05/2023)  Tobacco Use: Low Risk  (11/12/2023)    Chaplain and patient discussed common feelings and emotions when being diagnosed with cancer, and the importance of support during treatment.  Chaplain informed patient of the support team and support services at [facility].  Chaplain provided contact information and encouraged  patient to call with any questions or concerns.  Ask describes herself as artsy and plans to review support programming information for opportunities of interest. She also notes that she may have interest in emotional support particularly if she has breast reduction.  Follow up needed: No. Sandy plans to reach out as needed/desired for follow-up support.   [address] [place], [place], [facility] Pager [phone] Voicemail [phone]

## 2023-12-11 NOTE — Progress Notes (Signed)
 Radiation Oncology         [phone] ________________________________  Name: Tara Campbell        MRN: [id number]  Date of Service: 12/11/2023 DOB: 03/08/1946  RR:[id number], Tara BIRCH, MD  Ebbie Cough, MD     REFERRING PHYSICIAN: Ebbie Cough, MD   DIAGNOSIS: The encounter diagnosis was Malignant neoplasm of upper-outer quadrant of right breast in female, estrogen receptor positive (HCC).   HISTORY OF PRESENT ILLNESS: Tara Campbell is a 78 y.o. female seen in the multidisciplinary breast clinic for a new diagnosis of right breast cancer. The patient was noted to have screening detected mass. Diagnostic work up on 12/02/23 noted the mass in the 10:00 measuring 8 mm by ultrasound the axilla wa snegative for adenopathy. 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 The cancer was ER/PR positive, HER2 negative with a Ki67 of 10%. She's seen today to discuss treatment recommendations of her cancer.     PREVIOUS RADIATION THERAPY: No   PAST MEDICAL HISTORY:  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   Otomycosis of right ear 09/27/2017   Paroxysmal atrial fibrillation (HCC)    Thyroid  disease    Trochanteric bursitis of right hip 05/29/2017       PAST SURGICAL HISTORY: Past Surgical History:  Procedure Laterality Date   ANGIOPLASTY     BREAST BIOPSY Right 12/03/2023   US  RT BREAST BX W LOC DEV 1ST LESION IMG BX SPEC US  GUIDE 12/03/2023 [facility] MAMMOGRAPHY   CARDIAC CATHETERIZATION     COLONOSCOPY W/ POLYPECTOMY  01/04/2015   CORONARY STENT INTERVENTION N/A 08/31/2022   Procedure: CORONARY STENT INTERVENTION;  Surgeon: Wonda Sharper, MD;   Location: [facility] INVASIVE CV LAB;  Service: Cardiovascular;  Laterality: N/A;   HEMORRHOID BANDING     LEFT HEART CATH AND CORONARY ANGIOGRAPHY N/A 04/26/2021   Procedure: LEFT HEART CATH AND CORONARY ANGIOGRAPHY;  Surgeon: Wonda Sharper, MD;  Location: [facility] INVASIVE CV LAB;  Service: Cardiovascular;  Laterality: N/A;   LEFT HEART CATH AND CORONARY ANGIOGRAPHY N/A 09/14/2022   Procedure: LEFT HEART CATH AND CORONARY ANGIOGRAPHY;  Surgeon: Wonda Sharper, MD;  Location: [facility] INVASIVE CV LAB;  Service: Cardiovascular;  Laterality: N/A;     FAMILY HISTORY:  Family History  Problem Relation Age of Onset   Hypertension Mother    Hyperlipidemia Mother    Alzheimer's disease Mother    Vascular Disease Mother    Glaucoma Father    Heart disease Father    Hypertension Father    Diabetes Father    Hyperlipidemia Father    Skin cancer Father    Diabetes Paternal Grandmother    Colon cancer Neg Hx    Esophageal cancer Neg Hx      SOCIAL HISTORY:  reports that she has never smoked. She has never used smokeless tobacco. She reports current alcohol use. She reports that she does not use drugs. The patient is divorced and lives in [place]. She retired to [employer] from [place] , and had worked in a school system prior to retiring. She's accompanied by  her sister Tara Campbell. They enjoy spending time together and going shopping. She also enjoys painting.    ALLERGIES: Sulfamethoxazole, Sertraline , Sulfa antibiotics, and Tape   MEDICATIONS:  Current Outpatient Medications  Medication Sig Dispense Refill   acetaminophen  (TYLENOL ) 650 MG CR tablet Take 1 tablet (650 mg total) by mouth every 8 (eight) hours as needed for pain. 90 tablet 3   albuterol  (VENTOLIN  HFA) 108 (90 Base) MCG/ACT inhaler Inhale 2 puffs into the lungs every 6 (six) hours as needed for wheezing. 18 g 5   allopurinol  (ZYLOPRIM ) 300 MG tablet Take 300 mg by mouth daily.     ALPRAZolam  (XANAX ) 0.25 MG tablet Take 1 tablet (0.25 mg total) by  mouth 2 (two) times daily as needed for anxiety. 20 tablet 0   apixaban  (ELIQUIS ) 5 MG TABS tablet Take 1 tablet (5 mg total) by mouth 2 (two) times daily. 180 tablet 3   atenolol  (TENORMIN ) 100 MG tablet Take 1 tablet (100 mg total) by mouth daily. 90 tablet 1   atorvastatin  (LIPITOR ) 80 MG tablet Take 1 tablet (80 mg total) by mouth at bedtime. 90 tablet 3   Budeson-Glycopyrrol-Formoterol  (BREZTRI  AEROSPHERE) 160-9-4.8 MCG/ACT AERO Inhale 2 puffs into the lungs in the morning and at bedtime. 1 each 3   cyanocobalamin  (VITAMIN B12) 1000 MCG tablet Take 1,000 mcg by mouth daily.     empagliflozin  (JARDIANCE ) 10 MG TABS tablet Take 1 tablet (10 mg total) by mouth daily before breakfast. 100 tablet 4   EPINEPHrine  0.3 mg/0.3 mL IJ SOAJ injection Inject 0.3 mg into the muscle as needed for anaphylaxis. Call 9-1-1 after use. 1 each 2   Evolocumab  (REPATHA  SURECLICK) 140 MG/ML SOAJ Inject 140 mg into the skin every 14 (fourteen) days. 2 mL 11   ezetimibe  (ZETIA ) 10 MG tablet Take 1 tablet (10 mg total) by mouth daily. 90 tablet 3   fluticasone  (FLONASE ) 50 MCG/ACT nasal spray Place 1 spray into both nostrils daily. 16 g 3   fluticasone  furoate-vilanterol (BREO ELLIPTA ) 200-25 MCG/ACT AEPB INHALE 1 PUFF INTO THE lungs DAILY 60 each 5   glucose blood test strip To be used twice daily for testing blood sugars. E11.65 200 each 11   ipratropium (ATROVENT ) 0.03 % nasal spray Place 2 sprays into both nostrils every 12 (twelve) hours. 30 mL 0   ipratropium-albuterol  (DUONEB) 0.5-2.5 (3) MG/3ML SOLN Take 3 mLs by nebulization every 6 (six) hours as needed. 3 mL PRN   irbesartan  (AVAPRO ) 300 MG tablet Take 300 mg by mouth daily.     isosorbide  mononitrate (IMDUR ) 60 MG 24 hr tablet Patient takes 0.5 tablet in the morning by mouth and 1 tablet in the evening.     levothyroxine  (SYNTHROID ) 125 MCG tablet Take 1 tablet (125 mcg total) by mouth daily before breakfast. 90 tablet 0   Mepolizumab  ([name] ) 100 MG/ML SOAJ  Inject 1 mL (100 mg total) into the skin every 28 (twenty-eight) days. 1 mL 4   metFORMIN  (GLUCOPHAGE ) 500 MG tablet Take 2 tablets (1,000 mg total) by mouth 2 (two) times daily with a meal. 180 tablet 3   nitroGLYCERIN  (NITROSTAT ) 0.4 MG SL tablet Place 1 tablet (0.4 mg total) under the tongue every 5 (five) minutes as needed for chest pain (or tightness). 25 tablet PRN   Omega-3 Fatty Acids (FISH OIL ) 1000 MG CAPS Take 1 capsule (1,000 mg total) by mouth daily. 90 capsule 3   oseltamivir  (TAMIFLU ) 75 MG capsule Take 1 capsule (75 mg total) by  mouth daily. 10 capsule 0   Polyvinyl Alcohol-Povidone (REFRESH OP) Place 1 drop into both eyes daily as needed (dry eyes).     predniSONE  (DELTASONE ) 10 MG tablet Take 6 tablets (60 mg total) by mouth daily with breakfast. Take 60 mg/day on day 1 and reduce dose by 10 mg every 2 days until taper is complete 50 tablet 0   ranolazine  (RANEXA ) 500 MG 12 hr tablet Take 1 tablet (500 mg total) by mouth 2 (two) times daily. 60 tablet 5   Vitamin D , Cholecalciferol , 25 MCG (1000 UT) TABS Take 25 mcg by mouth daily in the afternoon. 60 tablet    zinc  gluconate 50 MG tablet Take 50 mg by mouth at bedtime.     No current facility-administered medications for this visit.     REVIEW OF SYSTEMS: On review of systems, the patient reports that she is doing well overall since her biopsy. She is interested in the possibility of breast reduction. No other complaints are verbalized.      PHYSICAL EXAM:  Wt Readings from Last 3 Encounters:  11/12/23 194 lb (88 kg)  10/22/23 194 lb (88 kg)  09/10/23 194 lb (88 kg)   Temp Readings from Last 3 Encounters:  10/22/23 98.5 F (36.9 C) (Oral)  12/04/22 (!) 97.5 F (36.4 C) (Oral)  11/23/22 98.3 F (36.8 C) (Oral)   BP Readings from Last 3 Encounters:  11/12/23 136/64  10/25/23 127/60  10/22/23 128/64   Pulse Readings from Last 3 Encounters:  11/12/23 (!) 107  10/25/23 90  10/22/23 99    In general this is a  well appearing caucasian female in no acute distress. She's alert and oriented x4 and appropriate throughout the examination. Cardiopulmonary assessment is negative for acute distress and she exhibits normal effort. Bilateral breast exam is deferred.    ECOG = 0  0 - Asymptomatic (Fully active, able to carry on all predisease activities without restriction)  1 - Symptomatic but completely ambulatory (Restricted in physically strenuous activity but ambulatory and able to carry out work of a light or sedentary nature. For example, light housework, office work)  2 - Symptomatic, <50% in bed during the day (Ambulatory and capable of all self care but unable to carry out any work activities. Up and about more than 50% of waking hours)  3 - Symptomatic, >50% in bed, but not bedbound (Capable of only limited self-care, confined to bed or chair 50% or more of waking hours)  4 - Bedbound (Completely disabled. Cannot carry on any self-care. Totally confined to bed or chair)  5 - Death   Raylene MM, Creech RH, Tormey DC, et al. [phone]). Toxicity and response criteria of the [facility]. Am. DOROTHA Bridges. Oncol. 5 (6): 649-55    LABORATORY DATA:  Lab Results  Component Value Date   WBC 7.5 06/04/2023   HGB 12.0 06/04/2023   HCT 35.8 06/04/2023   MCV 89.1 06/04/2023   PLT 300 06/04/2023   Lab Results  Component Value Date   NA 139 08/06/2023   K 4.1 08/06/2023   CL 103 08/06/2023   CO2 26 08/06/2023   Lab Results  Component Value Date   ALT 12 06/04/2023   AST 13 06/04/2023   ALKPHOS 88 09/22/2022   BILITOT 0.4 06/04/2023      RADIOGRAPHY: US  RT BREAST BX W LOC DEV 1ST LESION IMG BX SPEC US  GUIDE Addendum Date: 12/08/2023 ADDENDUM REPORT: 12/08/2023 00:08 ADDENDUM: Pathology revealed GRADE II  INVASIVE MAMMARY CARCINOMA, MAMMARY CARCINOMA IN-SITU of the RIGHT breast, 10 o'clock, (ribbon clip). E-cadherin is POSITIVE supporting a ductal origin. This was found to be  concordant by Dr. Bard Moats. Pathology results were discussed with the patient and her sister, Tara Campbell [place], by telephone. The patient reported doing well after the biopsy with minimal tenderness at the site. Post biopsy instructions and care were reviewed and questions were answered. The patient was encouraged to call [facility] for any additional concerns. My direct phone number was provided. The patient was referred to [facility] at [facility] on December 11, 2023. Pathology results reported by Hendricks Benders, RN on 12/06/2023. Electronically Signed   By: Bard Moats M.D.   On: 12/08/2023 00:08   Result Date: 12/08/2023 CLINICAL DATA:  Indeterminate right breast mass EXAM: ULTRASOUND GUIDED RIGHT BREAST CORE NEEDLE BIOPSY COMPARISON:  Previous exam(s). PROCEDURE: I met with the patient and we discussed the procedure of ultrasound-guided biopsy, including benefits and alternatives. We discussed the high likelihood of a successful procedure. We discussed the risks of the procedure, including infection, bleeding, tissue injury, clip migration, and inadequate sampling. Informed written consent was given. The usual time-out protocol was performed immediately prior to the procedure. Lesion quadrant: Upper outer quadrant Using sterile technique and 1% Lidocaine  as local anesthetic, under direct ultrasound visualization, a 14 gauge spring-loaded device was used to perform biopsy of right breast mass 10 o'clock position using a lateral approach. At the conclusion of the procedure ribbon shaped tissue marker clip was deployed into the biopsy cavity. Follow up 2 view mammogram was performed and dictated separately. IMPRESSION: Ultrasound guided biopsy of right breast mass 10 o'clock position. No apparent complications. Electronically Signed: By: Bard Moats M.D. On: 12/03/2023 10:21   MM 3D DIAGNOSTIC MAMMOGRAM UNILATERAL RIGHT  BREAST Result Date: 12/02/2023 CLINICAL DATA:  78 year old female presents for further evaluation of possible RIGHT breast mass on screening mammogram. EXAM: DIGITAL DIAGNOSTIC UNILATERAL RIGHT MAMMOGRAM WITH TOMOSYNTHESIS AND CAD; ULTRASOUND RIGHT BREAST LIMITED TECHNIQUE: Right digital diagnostic mammography and breast tomosynthesis was performed. The images were evaluated with computer-aided detection. ; Targeted ultrasound examination of the right breast was performed COMPARISON:  Previous exam(s). ACR Breast Density Category c: The breasts are heterogeneously dense, which may obscure small masses. FINDINGS: Spot compression views of the RIGHT breast demonstrate a persistent irregular mass within the middle depth UPPER-OUTER RIGHT breast. Targeted ultrasound is performed, showing a 0.7 x 0.6 x 0.8 cm irregular hypoechoic mass at the 10 o'clock position of the RIGHT breast 12 cm from the nipple, corresponding to the mammographic finding. No abnormal RIGHT axillary lymph nodes are noted. IMPRESSION: 1. Highly suspicious 0.8 cm UPPER-OUTER RIGHT breast mass. Tissue sampling is recommended. No abnormal appearing RIGHT axillary lymph nodes. RECOMMENDATION: Ultrasound-guided RIGHT breast biopsy, which has been scheduled for 12/03/2023. I have discussed the findings and recommendations with the patient. If applicable, a reminder letter will be sent to the patient regarding the next appointment. BI-RADS CATEGORY  5: Highly suggestive of malignancy. Electronically Signed   By: Reyes Phi M.D.   On: 12/02/2023 13:40   US  LIMITED ULTRASOUND INCLUDING AXILLA RIGHT BREAST Result Date: 12/02/2023 CLINICAL DATA:  78 year old female presents for further evaluation of possible RIGHT breast mass on screening mammogram. EXAM: DIGITAL DIAGNOSTIC UNILATERAL RIGHT MAMMOGRAM WITH TOMOSYNTHESIS AND CAD; ULTRASOUND RIGHT BREAST LIMITED TECHNIQUE: Right digital diagnostic mammography and breast tomosynthesis was performed. The  images were evaluated  with computer-aided detection. ; Targeted ultrasound examination of the right breast was performed COMPARISON:  Previous exam(s). ACR Breast Density Category c: The breasts are heterogeneously dense, which may obscure small masses. FINDINGS: Spot compression views of the RIGHT breast demonstrate a persistent irregular mass within the middle depth UPPER-OUTER RIGHT breast. Targeted ultrasound is performed, showing a 0.7 x 0.6 x 0.8 cm irregular hypoechoic mass at the 10 o'clock position of the RIGHT breast 12 cm from the nipple, corresponding to the mammographic finding. No abnormal RIGHT axillary lymph nodes are noted. IMPRESSION: 1. Highly suspicious 0.8 cm UPPER-OUTER RIGHT breast mass. Tissue sampling is recommended. No abnormal appearing RIGHT axillary lymph nodes. RECOMMENDATION: Ultrasound-guided RIGHT breast biopsy, which has been scheduled for 12/03/2023. I have discussed the findings and recommendations with the patient. If applicable, a reminder letter will be sent to the patient regarding the next appointment. BI-RADS CATEGORY  5: Highly suggestive of malignancy. Electronically Signed   By: Reyes Phi M.D.   On: 12/02/2023 13:40       IMPRESSION/PLAN: 1. 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 Dr. Dewey discusses the pathology findings and reviews the nature of right breast disease. The consensus from the breast conference includes breast conservation with lumpectomy. Dr. Gudena recommends  Oncotype Dx score to determine a role for systemic therapy. Provided that chemotherapy is not indicated, we reviewed the rationale for external beam radiation to the right breast to reduce risks of local recurrence. Dr. Dewey also discusses cases in which radiation may be optional for favorable cases based on final pathology.  We will follow-up with her final results of surgery, but if recommended Dr. Dewey would anticipate a course of 4 weeks  of radiotherapy or an ultrahypofractionated course once weekly for 5 fractions.  We discussed the risks, benefits, short, and long term effects of radiotherapy, as well as the curative intent, if she were to proceed. We will see her back a few weeks after surgery to discuss the final decision making. 2. Possible genetic predisposition to malignancy. The patient is a candidate for genetic testing given her personal history. She will meet with our geneticist today in clinic.   In a visit lasting 60 minutes, greater than 50% of the time was spent face to face reviewing her case, as well as in preparation of, discussing, and coordinating the patient's care.  The above documentation reflects my direct findings during this shared patient visit. Please see the separate note by Dr. Dewey on this date for the remainder of the patient's plan of care.    Donald KYM Husband, [facility]    **Disclaimer: This note was dictated with voice recognition software. Similar sounding words can inadvertently be transcribed and this note may contain transcription errors which may not have been corrected upon publication of note.**

## 2023-12-11 NOTE — Assessment & Plan Note (Signed)
 12/03/2023:Screening mammogram detected right breast mass at 10 o'clock position measuring 0.8 cm, axilla negative, ultrasound-guided biopsy revealed grade 2 IDC with DCIS ER 95%, PR 20%, HER2 negative, Ki-67 10%  Pathology and radiology counseling:Discussed with the patient, the details of pathology including the type of breast cancer,the clinical staging, the significance of ER, PR and HER-2/neu receptors and the implications for treatment. After reviewing the pathology in detail, we proceeded to discuss the different treatment options between surgery, radiation, chemotherapy, antiestrogen therapies.  Recommendations: 1. Breast conserving surgery followed by 2. Oncotype DX testing to determine if chemotherapy would be of any benefit followed by 3. Adjuvant radiation therapy followed by 4. Adjuvant antiestrogen therapy  Oncotype counseling: I discussed Oncotype DX test. I explained to the patient that this is a 21 gene panel to evaluate patient tumors DNA to calculate recurrence score. This would help determine whether patient has high risk or low risk breast cancer. She understands that if her tumor was found to be high risk, she would benefit from systemic chemotherapy. If low risk, no need of chemotherapy.  Return to clinic after surgery to discuss final pathology report and then determine if Oncotype DX testing will need to be sent.

## 2023-12-12 ENCOUNTER — Encounter: Payer: Self-pay | Admitting: Genetic Counselor

## 2023-12-12 ENCOUNTER — Encounter: Payer: Self-pay | Admitting: Pulmonary Disease

## 2023-12-12 ENCOUNTER — Encounter: Payer: Self-pay | Admitting: Family Medicine

## 2023-12-12 ENCOUNTER — Encounter (HOSPITAL_COMMUNITY): Payer: Self-pay | Admitting: Cardiology

## 2023-12-12 ENCOUNTER — Encounter (HOSPITAL_COMMUNITY): Payer: Self-pay

## 2023-12-12 ENCOUNTER — Ambulatory Visit (HOSPITAL_COMMUNITY)
Admission: RE | Admit: 2023-12-12 | Discharge: 2023-12-12 | Disposition: A | Discharge status: Home / Self Care | Payer: Medicare Other | Source: Ambulatory Visit | Attending: Cardiology | Admitting: Cardiology

## 2023-12-12 VITALS — BP 130/68 | HR 82 | Wt 193.8 lb

## 2023-12-12 DIAGNOSIS — I252 Old myocardial infarction: Secondary | ICD-10-CM | POA: Diagnosis not present

## 2023-12-12 DIAGNOSIS — E119 Type 2 diabetes mellitus without complications: Secondary | ICD-10-CM | POA: Insufficient documentation

## 2023-12-12 DIAGNOSIS — Z7901 Long term (current) use of anticoagulants: Secondary | ICD-10-CM | POA: Insufficient documentation

## 2023-12-12 DIAGNOSIS — I480 Paroxysmal atrial fibrillation: Secondary | ICD-10-CM | POA: Diagnosis not present

## 2023-12-12 DIAGNOSIS — Z7984 Long term (current) use of oral hypoglycemic drugs: Secondary | ICD-10-CM | POA: Diagnosis not present

## 2023-12-12 DIAGNOSIS — J45909 Unspecified asthma, uncomplicated: Secondary | ICD-10-CM | POA: Diagnosis not present

## 2023-12-12 DIAGNOSIS — I2510 Atherosclerotic heart disease of native coronary artery without angina pectoris: Secondary | ICD-10-CM | POA: Diagnosis present

## 2023-12-12 DIAGNOSIS — C50919 Malignant neoplasm of unspecified site of unspecified female breast: Secondary | ICD-10-CM

## 2023-12-12 DIAGNOSIS — I10 Essential (primary) hypertension: Secondary | ICD-10-CM | POA: Diagnosis not present

## 2023-12-12 DIAGNOSIS — Z955 Presence of coronary angioplasty implant and graft: Secondary | ICD-10-CM | POA: Insufficient documentation

## 2023-12-12 NOTE — Patient Instructions (Signed)
 Medication Changes:  No Changes In Medications at this time.   Referrals:  YOU HAVE BEEN REFERRED TO [facility] AT [facility] OFF[facility] THEY WILL REACH OUT TO YOU OR CALL TO ARRANGE THIS. PLEASE CALL US  WITH ANY CONCERNS   Follow-Up in: 4 MONS ,PLEASE CALL OUR OFFICE AROUND MARCH 2025 TO GET SCHEDULED FOR YOUR  4 MON APPOINTMENT. PHONE NUMBER IS [phone] OPTION 2    At the [facility], you and your health needs are our priority. We have a designated team specialized in the treatment of Heart Failure. This Care Team includes your primary Heart Failure Specialized Cardiologist (physician), Advanced Practice Providers (APPs- Physician Assistants and Nurse Practitioners), and Pharmacist who all work together to provide you with the care you need, when you need it.   You may see any of the following providers on your designated Care Team at your next follow up:  Dr. Toribio Fuel Dr. Ezra Shuck Dr. Ria Commander Dr. Odis Brownie Greig Mosses, NP Caffie Shed, [place] [facility], [place] Beckey Coe, NP Jordan Lee, NP Tinnie Redman, PharmD   Please be sure to bring in all your medications bottles to every appointment.   Need to Contact Us :  If you have any questions or concerns before your next appointment please send us  a message through [place] or call our office at [phone].    TO LEAVE A MESSAGE FOR THE NURSE SELECT OPTION 2, PLEASE LEAVE A MESSAGE INCLUDING: YOUR NAME DATE OF BIRTH CALL BACK NUMBER REASON FOR CALL**this is important as we prioritize the call backs  YOU WILL RECEIVE A CALL BACK THE SAME DAY AS LONG AS YOU CALL BEFORE 4:00 PM

## 2023-12-12 NOTE — Progress Notes (Signed)
 REFERRING PROVIDER: Odean Potts, MD [address] [place],  [place] [address]  PRIMARY PROVIDER:  Alvan Dorothyann BIRCH, MD  PRIMARY REASON FOR VISIT:  1. Malignant neoplasm of upper-outer quadrant of right breast in female, estrogen receptor positive (HCC)    HISTORY OF PRESENT ILLNESS:   Ms. Rivkin, a 78 y.o. female, was seen for a [facility] cancer genetics consultation at the request of Dr. Odean due to a personal history of breast cancer.  Ms. Handa presents to clinic today to discuss the possibility of a hereditary predisposition to cancer, to discuss genetic testing, and to further clarify her future cancer risks, as well as potential cancer risks for family members.   In December 2024, at the age of 35, Ms. Calvario was diagnosed with invasive ductal carcinoma of the right breast (ER+/PR+/HER2-). The treatment plan is pending.    CANCER HISTORY:  Oncology History  Malignant neoplasm of upper-outer quadrant of right breast in female, estrogen receptor positive (HCC)  12/03/2023 Initial Diagnosis   Screening mammogram detected right breast mass at 10 o'clock position measuring 0.8 cm, axilla negative, ultrasound-guided biopsy revealed grade 2 IDC with DCIS ER 95%, PR 20%, HER2 negative, Ki-67 10%   12/11/2023 Cancer Staging   Staging form: Breast, AJCC 8th Edition - Clinical stage from 12/11/2023: Stage IA (cT1b, cN0, cM0, G2, ER+, PR+, HER2-) - Signed by Lanell Donald Stagger, PA-C on 12/11/2023 Stage prefix: Initial diagnosis Method of lymph node assessment: Clinical Histologic grading system: 3 grade system      Past Medical History:  Diagnosis Date   Allergy    Arthritis    Asthma    Colon polyps    Coronary artery disease    Diabetes mellitus without complication (HCC)    Gout    Heart attack (HCC) 07/30/2016   PCI, 2 stents   Hyperlipidemia    Hypertension    Hypothyroidism    Internal hemorrhoids    Left rotator cuff tear    Mild stage  glaucoma 12/12/2017   Otomycosis of right ear 09/27/2017   Paroxysmal atrial fibrillation (HCC)    Thyroid  disease    Trochanteric bursitis of right hip 05/29/2017    Past Surgical History:  Procedure Laterality Date   ANGIOPLASTY     BREAST BIOPSY Right 12/03/2023   US  RT BREAST BX W LOC DEV 1ST LESION IMG BX SPEC US  GUIDE 12/03/2023 [facility] MAMMOGRAPHY   CARDIAC CATHETERIZATION     COLONOSCOPY W/ POLYPECTOMY  01/04/2015   CORONARY STENT INTERVENTION N/A 08/31/2022   Procedure: CORONARY STENT INTERVENTION;  Surgeon: Wonda Sharper, MD;  Location: [facility] INVASIVE CV LAB;  Service: Cardiovascular;  Laterality: N/A;   HEMORRHOID BANDING     LEFT HEART CATH AND CORONARY ANGIOGRAPHY N/A 04/26/2021   Procedure: LEFT HEART CATH AND CORONARY ANGIOGRAPHY;  Surgeon: Wonda Sharper, MD;  Location: [facility] INVASIVE CV LAB;  Service: Cardiovascular;  Laterality: N/A;   LEFT HEART CATH AND CORONARY ANGIOGRAPHY N/A 09/14/2022   Procedure: LEFT HEART CATH AND CORONARY ANGIOGRAPHY;  Surgeon: Wonda Sharper, MD;  Location: [facility] INVASIVE CV LAB;  Service: Cardiovascular;  Laterality: N/A;       FAMILY HISTORY:  We obtained a detailed, 4-generation family history.  Significant diagnoses are listed below: Family History  Problem Relation Age of Onset   Skin cancer Father        SCC; dx 55s-80s    Ms. Scharfenberg is unaware of previous family history of genetic testing for hereditary cancer risks. There is no  reported Ashkenazi Jewish ancestry. There is no known consanguinity.  GENETIC COUNSELING ASSESSMENT: Ms. Righter is a 78 y.o. female with a personal and family history which is not highly suggestive of a hereditary cancer syndrome. We, therefore, discussed and recommended the following at today's visit.   DISCUSSION:  We discussed that, in general, most cancer is not inherited in families, but instead is sporadic or familial. Sporadic cancers occur by chance and typically happen at older ages (>50 years) as  this type of cancer is caused by genetic changes acquired during an individual's lifetime. Some families have more cancers than would be expected by chance; however, the ages or types of cancer are not consistent with a known genetic mutation or known genetic mutations have been ruled out. This type of familial cancer is thought to be due to a combination of multiple genetic, environmental, hormonal, and lifestyle factors. While this combination of factors likely increases the risk of cancer, the exact source of this risk is not currently identifiable or testable.    We discussed that approximately 5-10% of cancer is hereditary, meaning that it is due to a mutation in a single gene that is passed down from generation to generation in a family. Most hereditary cases of breast cancer are associated with mutations in BRCA1/2. There are other genes that can be associated an increased risk for breast cancer. We discussed that testing can be beneficial for several reasons, including knowing about other cancer risks, identifying potential screening and risk-reduction options that may be appropriate, and to understand if other family members could be at risk for cancer and allow them to undergo genetic testing.  We discussed with Ms. Gurski that the personal and family history does not meet insurance or NCCN criteria for genetic testing and, therefore, is not highly consistent with a familial hereditary cancer syndrome.  We did not recommend genetic testing at this time but discussed that it is still available if they wished to proceed, especially given she has a personal history of breast cancer (meets American Society of Breast Surgeons guidelines).  Ms. Asbury was interested in proceeding with testing in order to provide as much information about cancer risk to her family as possible.    We reviewed the characteristics, features and inheritance patterns of hereditary cancer syndromes. We also discussed the  process of testing, insurance coverage and turn-around-time for results. We discussed the implications of a negative, positive, and variant of uncertain significant result.   Ms. Osten  was offered a common hereditary cancer panel (~40 genes) and an expanded pan-cancer panel (~70 genes). Ms. Schumm was informed of the benefits and limitations of each panel, including that expanded pan-cancer panels contain genes that do not have clear management guidelines at this point in time.  We also discussed that as the number of genes included on a panel increases, the chances of variants of uncertain significance increases.  After considering the benefits and limitations of each gene panel, Ms. Puccini  elected to have an expanded pan-cancer panel through [employer]  The CancerNext-Expanded gene panel offered by [facility] and includes sequencing, rearrangement, and RNA analysis for the following 76 genes: AIP, ALK, APC, ATM, AXIN2, BAP1, BARD1, BMPR1A, BRCA1, BRCA2, BRIP1, CDC73, CDH1, CDK4, CDKN1B, CDKN2A, CEBPA, CHEK2, CTNNA1, DDX41, DICER1, ETV6, FH, FLCN, GATA2, LZTR1, MAX, MBD4, MEN1, MET, MLH1, MSH2, MSH3, MSH6, MUTYH, NF1, NF2, NTHL1, PALB2, PHOX2B, PMS2, POT1, PRKAR1A, PTCH1, PTEN, RAD51C, RAD51D, RB1, RET, RUNX1, SDHA, SDHAF2, SDHB, SDHC, SDHD, SMAD4, SMARCA4, SMARCB1,  SMARCE1, STK11, SUFU, TMEM127, TP53, TSC1, TSC2, VHL, and WT1 (sequencing and deletion/duplication); EGFR, HOXB13, KIT, MITF, PDGFRA, POLD1, and POLE (sequencing only); EPCAM and GREM1 (deletion/duplication only).   She understands that there may be an out of pocket cost for testing. We discussed that if her out of pocket cost for testing is over $100, the laboratory should contact them to discuss self-pay options and/or patient pay assistance programs.   PLAN: After considering the risks, benefits, and limitations, Ms. Arakelian provided informed consent to pursue genetic testing and the blood sample was sent to [facility]  for analysis of the CancerNext-Expanded +RNAinsight Panel. Results should be available within approximately 3 weeks' time, at which point they will be disclosed by MyChart message to Ms. Cilia, as will any additional recommendations warranted by these results. Ms. Risko will receive a summary of her genetic counseling visit and a copy of her results once available. This information will also be available in Epic.   Ms. Ruggiero questions were answered to her satisfaction today. Our contact information was provided should additional questions or concerns arise. Thank you for the referral and allowing us  to share in the care of your patient.   Ferd Horrigan M. Nydia, MS, [facility] Genetic Counselor [email or web address] (P) [phone]    25 minutes were spent on the date of the encounter in service to the patient including preparation, face-to-face consultation, documentation and care coordination.  The patient was accompanied by her sister, Romero.  Dr. Gudena was available to discuss this case as needed.  _______________________________________________________________________ For Office Staff:  Number of people involved in session: 2 Was an Intern/ student involved with case: no

## 2023-12-14 NOTE — Telephone Encounter (Signed)
 Please see mychart sent by pt and advise.

## 2023-12-16 ENCOUNTER — Other Ambulatory Visit: Payer: Self-pay | Admitting: General Surgery

## 2023-12-16 DIAGNOSIS — Z170 Estrogen receptor positive status [ER+]: Secondary | ICD-10-CM

## 2023-12-18 ENCOUNTER — Ambulatory Visit (INDEPENDENT_AMBULATORY_CARE_PROVIDER_SITE_OTHER): Payer: Medicare Other | Admitting: Family Medicine

## 2023-12-18 ENCOUNTER — Encounter: Payer: Self-pay | Admitting: Family Medicine

## 2023-12-18 VITALS — BP 160/70 | HR 97 | Ht 64.0 in | Wt 190.0 lb

## 2023-12-18 DIAGNOSIS — R21 Rash and other nonspecific skin eruption: Secondary | ICD-10-CM | POA: Diagnosis not present

## 2023-12-18 DIAGNOSIS — N1831 Chronic kidney disease, stage 3a: Secondary | ICD-10-CM

## 2023-12-18 DIAGNOSIS — E1122 Type 2 diabetes mellitus with diabetic chronic kidney disease: Secondary | ICD-10-CM | POA: Diagnosis not present

## 2023-12-18 DIAGNOSIS — Z7984 Long term (current) use of oral hypoglycemic drugs: Secondary | ICD-10-CM

## 2023-12-18 DIAGNOSIS — I10 Essential (primary) hypertension: Secondary | ICD-10-CM | POA: Diagnosis not present

## 2023-12-18 LAB — POCT GLYCOSYLATED HEMOGLOBIN (HGB A1C): Hemoglobin A1C: 7.6 % — AB (ref 4.0–5.6)

## 2023-12-18 MED ORDER — CLOTRIMAZOLE-BETAMETHASONE 1-0.05 % EX CREA: 1.0000 | TOPICAL_CREAM | Freq: Two times a day (BID) | CUTANEOUS | 0 refills | Status: DC

## 2023-12-18 MED ORDER — TIRZEPATIDE 2.5 MG/0.5ML ~~LOC~~ SOAJ: 2.5000 mg | SUBCUTANEOUS | 0 refills | Status: DC

## 2023-12-18 NOTE — Assessment & Plan Note (Addendum)
 Send his blood pressure was unusually elevated today.  Going to have her try to check it at home over the next couple days and make sure that it is coming back down I do not want it to be elevated before surgery.  Work on cutting back on salt.

## 2023-12-18 NOTE — Assessment & Plan Note (Addendum)
 A1C is up form prior.  Not well-controlled today.  Just encouraged her to get back on track with taking her medications and also really encouraged her to work harder for the next couple weeks to get her blood glucose levels under better control before her surgery in 2 weeks so that she can have the most optimal healing process.  We also discussed maybe transitioning to a GLP-1 I think would help with her glucose well levels as well as help reduce her BMI.  I can also lower blood pressure and offer additional renal protection.  I would also like to be able to simplify her diabetes regimen I think sometimes she just feels like she is overwhelmed with the number of pills that she has to take daily.

## 2023-12-18 NOTE — Patient Instructions (Addendum)
 Recommend use of benzyl peroxide wash.  A pretty easy 1 defined is called PanOxyl.  Just use that in the shower to wash the affected area.  Pat dry after shower and then apply a thin layer of the prescription cream twice a day.

## 2023-12-18 NOTE — Progress Notes (Signed)
 Established Patient Office Visit  Subjective  Patient ID: Tara Campbell, female    DOB: August 26, 1946  Age: 78 y.o. MRN: [id number]  Chief Complaint  Patient presents with   Diabetes   Rash    Under R breast x 1.5 weeks     HPI  Since I last saw her she had an abnormal mammogram and will undergo a right breast lumpectomy at the end of this month with Dr. Delane Fear, general surgery. She really had a positive outlook and is doing OK. She has good support in her life.    Hypertension- Pt denies chest pain, SOB, dizziness, or heart palpitations.  Taking meds as directed w/o problems.  Denies medication side effects.     Rash Under R breast x 1.5 weeks     Diabetes - no hypoglycemic events. No wounds or sores that are not healing well. No increased thirst or urination. Checking glucose at home. Taking medications inconsistantly.     ROS    Objective:     BP (!) 160/70   Pulse 97   Ht 5\' 4"  (1.626 m)   Wt 190 lb (86.2 kg)   SpO2 100%   BMI 32.61 kg/m    Physical Exam Vitals and nursing note reviewed.  Constitutional:      Appearance: Normal appearance.  HENT:     Head: Normocephalic and atraumatic.  Eyes:     Conjunctiva/sclera: Conjunctivae normal.  Cardiovascular:     Rate and Rhythm: Normal rate and regular rhythm.  Pulmonary:     Effort: Pulmonary effort is normal.     Breath sounds: Normal breath sounds.  Skin:    General: Skin is warm and dry.  Neurological:     Mental Status: She is alert.  Psychiatric:        Mood and Affect: Mood normal.      Results for orders placed or performed in visit on 12/18/23  POCT HgB A1C  Result Value Ref Range   Hemoglobin A1C 7.6 (A) 4.0 - 5.6 %   HbA1c POC (<> result, manual entry)     HbA1c, POC (prediabetic range)     HbA1c, POC (controlled diabetic range)        The ASCVD Risk score (Arnett DK, et al., 2019) failed to calculate for the following reasons:   Risk score cannot be calculated because  patient has a medical history suggesting prior/existing ASCVD    Assessment & Plan:   Problem List Items Addressed This Visit       Cardiovascular and Mediastinum   Hypertension goal BP (blood pressure) < 140/90 (Chronic)   Send his blood pressure was unusually elevated today.  Going to have her try to check it at home over the next couple days and make sure that it is coming back down I do not want it to be elevated before surgery.  Work on cutting back on salt.        Endocrine   Type 2 diabetes mellitus with diabetic chronic kidney disease (HCC) - Primary   A1C is up form prior.  Not well-controlled today.  Just encouraged her to get back on track with taking her medications and also really encouraged her to work harder for the next couple weeks to get her blood glucose levels under better control before her surgery in 2 weeks so that she can have the most optimal healing process.  We also discussed maybe transitioning to a GLP-1 I think would help with her glucose  well levels as well as help reduce her BMI.  I can also lower blood pressure and offer additional renal protection.  I would also like to be able to simplify her diabetes regimen I think sometimes she just feels like she is overwhelmed with the number of pills that she has to take daily.      Relevant Medications   tirzepatide (MOUNJARO) 2.5 MG/0.5ML Pen   Other Relevant Orders   POCT HgB A1C (Completed)   Other Visit Diagnoses       Rash       Relevant Medications   clotrimazole -betamethasone  (LOTRISONE ) cream       Rash -We will treat with Lotrisone  cream and recommend benzyl peroxide wash.  If not improving over the next 2 weeks please let me know.  Return in about 3 months (around 03/17/2024) for Diabetes follow-up.    Duaine German, MD

## 2023-12-19 ENCOUNTER — Encounter: Payer: Self-pay | Admitting: *Deleted

## 2023-12-19 ENCOUNTER — Telehealth: Payer: Self-pay | Admitting: *Deleted

## 2023-12-19 DIAGNOSIS — Z170 Estrogen receptor positive status [ER+]: Secondary | ICD-10-CM

## 2023-12-19 NOTE — Telephone Encounter (Signed)
Spoke with patient to follow up from [facility] and assess navigation needs. Patient denies any questions or concerns at this time.  Encouraged her to call should anything arise.

## 2023-12-20 ENCOUNTER — Telehealth: Payer: Self-pay | Admitting: Pharmacy Technician

## 2023-12-20 ENCOUNTER — Ambulatory Visit: Payer: Medicare Other | Attending: Cardiovascular Disease | Admitting: Pharmacist

## 2023-12-20 ENCOUNTER — Telehealth: Payer: Self-pay | Admitting: Radiation Oncology

## 2023-12-20 ENCOUNTER — Telehealth: Payer: Self-pay | Admitting: Pharmacist

## 2023-12-20 ENCOUNTER — Encounter: Payer: Self-pay | Admitting: Pharmacist

## 2023-12-20 ENCOUNTER — Other Ambulatory Visit (HOSPITAL_COMMUNITY): Payer: Self-pay

## 2023-12-20 DIAGNOSIS — E782 Mixed hyperlipidemia: Secondary | ICD-10-CM | POA: Diagnosis not present

## 2023-12-20 DIAGNOSIS — Z955 Presence of coronary angioplasty implant and graft: Secondary | ICD-10-CM

## 2023-12-20 DIAGNOSIS — I25119 Atherosclerotic heart disease of native coronary artery with unspecified angina pectoris: Secondary | ICD-10-CM

## 2023-12-20 MED ORDER — REPATHA SURECLICK 140 MG/ML ~~LOC~~ SOAJ: 140.0000 mg | SUBCUTANEOUS | 11 refills | Status: DC

## 2023-12-20 NOTE — Telephone Encounter (Signed)
Pharmacy Patient Advocate Encounter   Received notification from Pt Calls Messages that prior authorization for repatha is required/requested.   Insurance verification completed.   The patient is insured through [facility] ADVANTAGE/RX ADVANCE .   Per test claim: PA required; PA submitted to above mentioned insurance via CoverMyMeds Key/confirmation #/EOC [id number] Status is pending

## 2023-12-20 NOTE — Telephone Encounter (Signed)
Called patient to schedule a consultation w. Bryson Ha. No answer, LVM for a return call.

## 2023-12-20 NOTE — Progress Notes (Signed)
Patient ID: Tara Campbell                 DOB: June 16, 1946                    MRN: [id number]     HPI: Tara Campbell is a 78 y.o. female patient referred to lipid clinic by Dr Shirlee Latch. PMH is significant for TIA, A fib, NSTEMI, HTN, CAD, T2DM, HLD, breast cancer, and obesity,.  Patient presents today with sister. LDL remains very elevated despite atorvastatin and ezetimibe. Patient has Repatha on med list but was receiving from patient assistance which needs updating.  Upon questioning, patient had misunderstood Repatha administration directions and she has been taking once monthly rather than every 2 weeks.  Reports she has been eating "treats" such as cookies and sweets. Main meals are typically chicken and vegetables. Has not been as able to be as physically active as she likes due to hip pain.  Scheduled for lumpectomy in 2 weeks.  Current Medications:  Atorvastatin 80mg  daily Ezetimbe 10mg  daily  Risk Factors:  CAD NSTEMI CHF T2DM  LDL goal: <55  Labs: TC 297, Trigs 281, HDL 53, LDL 188 (08/06/23)  Past Medical History:  Diagnosis Date   Allergy    Arthritis    Asthma    Colon polyps    Coronary artery disease    Diabetes mellitus without complication (HCC)    Gout    Heart attack (HCC) 07/30/2016   PCI, 2 stents   Hyperlipidemia    Hypertension    Hypothyroidism    Internal hemorrhoids    Left rotator cuff tear    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   Otomycosis of right ear 09/27/2017   Paroxysmal atrial fibrillation (HCC)    Thyroid disease    Trochanteric bursitis of right hip 05/29/2017    Current Outpatient Medications on File Prior to Visit  Medication Sig Dispense Refill   acetaminophen (TYLENOL) 650 MG CR tablet Take 1 tablet (650 mg total) by mouth every 8 (eight) hours as needed for pain. 90 tablet 3   albuterol (VENTOLIN HFA) 108 (90 Base) MCG/ACT inhaler Inhale 2 puffs into the lungs every 6 (six) hours as needed for wheezing. 18 g 5   allopurinol  (ZYLOPRIM) 300 MG tablet Take 300 mg by mouth daily.     ALPRAZolam (XANAX) 0.25 MG tablet Take 1 tablet (0.25 mg total) by mouth 2 (two) times daily as needed for anxiety. 20 tablet 0   apixaban (ELIQUIS) 5 MG TABS tablet Take 1 tablet (5 mg total) by mouth 2 (two) times daily. 180 tablet 3   atenolol (TENORMIN) 100 MG tablet Take 1 tablet (100 mg total) by mouth daily. 90 tablet 1   atorvastatin (LIPITOR) 80 MG tablet Take 1 tablet (80 mg total) by mouth at bedtime. 90 tablet 3   Budeson-Glycopyrrol-Formoterol (BREZTRI AEROSPHERE) 160-9-4.8 MCG/ACT AERO Inhale 2 puffs into the lungs in the morning and at bedtime. 1 each 3   clotrimazole-betamethasone (LOTRISONE) cream Apply 1 Application topically 2 (two) times daily. 2 weeks 30 g 0   cyanocobalamin (VITAMIN B12) 1000 MCG tablet Take 1,000 mcg by mouth daily.     empagliflozin (JARDIANCE) 10 MG TABS tablet Take 1 tablet (10 mg total) by mouth daily before breakfast. 100 tablet 4   EPINEPHrine 0.3 mg/0.3 mL IJ SOAJ injection Inject 0.3 mg into the muscle as needed for anaphylaxis. Call 9-1-1 after use. 1 each 2   Evolocumab (REPATHA  SURECLICK) 140 MG/ML SOAJ Inject 140 mg into the skin every 14 (fourteen) days. 2 mL 11   ezetimibe (ZETIA) 10 MG tablet Take 1 tablet (10 mg total) by mouth daily. 90 tablet 3   fluticasone (FLONASE) 50 MCG/ACT nasal spray Place 1 spray into both nostrils daily. 16 g 3   fluticasone furoate-vilanterol (BREO ELLIPTA) 200-25 MCG/ACT AEPB INHALE 1 PUFF INTO THE lungs DAILY 60 each 5   glucose blood test strip To be used twice daily for testing blood sugars. E11.65 200 each 11   ipratropium (ATROVENT) 0.03 % nasal spray Place 2 sprays into both nostrils every 12 (twelve) hours. 30 mL 0   ipratropium-albuterol (DUONEB) 0.5-2.5 (3) MG/3ML SOLN Take 3 mLs by nebulization every 6 (six) hours as needed. 3 mL PRN   irbesartan (AVAPRO) 300 MG tablet Take 150 mg by mouth daily.     isosorbide mononitrate (IMDUR) 60 MG 24 hr  tablet Patient takes 0.5 tablet in the morning by mouth and 1 tablet in the evening.     levothyroxine (SYNTHROID) 125 MCG tablet Take 1 tablet (125 mcg total) by mouth daily before breakfast. 90 tablet 0   Mepolizumab ([name]) 100 MG/ML SOAJ Inject 1 mL (100 mg total) into the skin every 28 (twenty-eight) days. 1 mL 4   metFORMIN (GLUCOPHAGE) 500 MG tablet Take 2 tablets (1,000 mg total) by mouth 2 (two) times daily with a meal. 180 tablet 3   nitroGLYCERIN (NITROSTAT) 0.4 MG SL tablet Place 1 tablet (0.4 mg total) under the tongue every 5 (five) minutes as needed for chest pain (or tightness). 25 tablet PRN   Omega-3 Fatty Acids (FISH OIL) 1000 MG CAPS Take 1 capsule (1,000 mg total) by mouth daily. 90 capsule 3   Polyvinyl Alcohol-Povidone (REFRESH OP) Place 1 drop into both eyes daily as needed (dry eyes).     ranolazine (RANEXA) 500 MG 12 hr tablet Take 1 tablet (500 mg total) by mouth 2 (two) times daily. 60 tablet 5   tirzepatide (MOUNJARO) 2.5 MG/0.5ML Pen Inject 2.5 mg into the skin once a week. 2 mL 0   Vitamin D, Cholecalciferol, 25 MCG (1000 UT) TABS Take 25 mcg by mouth daily in the afternoon. 60 tablet    zinc gluconate 50 MG tablet Take 50 mg by mouth at bedtime.     No current facility-administered medications on file prior to visit.    Allergies  Allergen Reactions   Sulfamethoxazole Rash    HIVES   Sertraline Other (See Comments)    Nausea/vomiting   Sulfa Antibiotics Rash   Tape Rash    Vinyl;tape    Assessment/Plan:  1. Hyperlipidemia - Patient last LDL very elevated at 188 despite high intensity statin treatment plus ezetimibe. Goal LDL <55 due to CHF, T2DM, and NSTEMI.   Currently on Repatha however was taking incorrectly. Re-educated that proper dosing is once every 2 weeks. Enrolled patient in [facility] grant since her patient assistance has not been renewed. Placed lab order to recheck fasting lipid panel in 3 months. If LDL remains above goal may need to  consider other treatment options.  Continue Atorvastatin 80mg  daily Ezetimbe 10mg  daily Repatha 140mg  q 2 weeks Recheck lipid panel in 3 months  Laural Golden, PharmD, BCACP, CDCES, CPP [address], [address] [place], [place], [address] Phone: [phone], Fax: [phone]

## 2023-12-20 NOTE — Telephone Encounter (Signed)
Please update prior authorization for Repatha

## 2023-12-20 NOTE — Patient Instructions (Addendum)
I have enrolled you in our grant program. Please provide the information to your pharmacy and it will take your copay down to 0 dollars.  Please remember to take your Repatha once every 2 weeks  We will recheck your fasting lipid panel in about 3 months  Please let me know if you have an questions or issues at the pharmacy   Laural Golden, PharmD, BCACP, CDCES, CPP [address], [address] [place], [place], [address] Phone: [phone], Fax: [phone]

## 2023-12-20 NOTE — Telephone Encounter (Signed)
Pharmacy Patient Advocate Encounter  Received notification from [facility] ADVANTAGE/RX ADVANCE that Prior Authorization for repatha has been APPROVED from 12/20/23 to 06/18/24   PA #/Case ID/Reference #: [id number]

## 2023-12-24 ENCOUNTER — Encounter: Payer: Self-pay | Admitting: Genetic Counselor

## 2023-12-24 ENCOUNTER — Ambulatory Visit: Payer: Self-pay | Admitting: Genetic Counselor

## 2023-12-24 DIAGNOSIS — Z1379 Encounter for other screening for genetic and chromosomal anomalies: Secondary | ICD-10-CM

## 2023-12-24 DIAGNOSIS — Z170 Estrogen receptor positive status [ER+]: Secondary | ICD-10-CM

## 2023-12-24 MED ORDER — REPATHA SURECLICK 140 MG/ML ~~LOC~~ SOAJ: 1.0000 mL | SUBCUTANEOUS | 1 refills | Status: AC

## 2023-12-24 NOTE — Progress Notes (Signed)
HPI:   Tara Campbell was previously seen in the [facility] due to a personal history of breast cancer and concerns regarding a hereditary predisposition to cancer.    Tara Campbell recent genetic test results were disclosed to her by MyChart message. These results and recommendations are discussed in more detail below.  CANCER HISTORY:  Oncology History  Malignant neoplasm of upper-outer quadrant of right breast in female, estrogen receptor positive (HCC)  12/03/2023 Initial Diagnosis   Screening mammogram detected right breast mass at 10 o'clock position measuring 0.8 cm, axilla negative, ultrasound-guided biopsy revealed grade 2 IDC with DCIS ER 95%, PR 20%, HER2 negative, Ki-67 10%   12/11/2023 Cancer Staging   Staging form: Breast, AJCC 8th Edition - Clinical stage from 12/11/2023: Stage IA (cT1b, cN0, cM0, G2, ER+, PR+, HER2-) - Signed by Ronny Bacon, PA-C on 12/11/2023 Stage prefix: Initial diagnosis Method of lymph node assessment: Clinical Histologic grading system: 3 grade system   12/20/2023 Genetic Testing   Negative [facility] CancerNext-Expanded +RNAinsight Panel.  Report date is 12/20/2023.   The CancerNext-Expanded gene panel offered by [facility] and includes sequencing, rearrangement, and RNA analysis for the following 76 genes: AIP, ALK, APC, ATM, AXIN2, BAP1, BARD1, BMPR1A, BRCA1, BRCA2, BRIP1, CDC73, CDH1, CDK4, CDKN1B, CDKN2A, CEBPA, CHEK2, CTNNA1, DDX41, DICER1, ETV6, FH, FLCN, GATA2, LZTR1, MAX, MBD4, MEN1, MET, MLH1, MSH2, MSH3, MSH6, MUTYH, NF1, NF2, NTHL1, PALB2, PHOX2B, PMS2, POT1, PRKAR1A, PTCH1, PTEN, RAD51C, RAD51D, RB1, RET, RUNX1, SDHA, SDHAF2, SDHB, SDHC, SDHD, SMAD4, SMARCA4, SMARCB1, SMARCE1, STK11, SUFU, TMEM127, TP53, TSC1, TSC2, VHL, and WT1 (sequencing and deletion/duplication); EGFR, HOXB13, KIT, MITF, PDGFRA, POLD1, and POLE (sequencing only); EPCAM and GREM1 (deletion/duplication only).       FAMILY HISTORY:  We obtained a  detailed, 4-generation family history.  Significant diagnoses are listed below:      Family History  Problem Relation Age of Onset   Skin cancer Father          SCC; dx 41s-80s     Tara Campbell is unaware of previous family history of genetic testing for hereditary cancer risks. There is no reported Ashkenazi Jewish ancestry. There is no known consanguinity.    GENETIC TEST RESULTS:  The [facility] CancerNext-Expanded +RNAinsight Panel found no pathogenic mutations.   The CancerNext-Expanded gene panel offered by [facility] and includes sequencing, rearrangement, and RNA analysis for the following 76 genes: AIP, ALK, APC, ATM, AXIN2, BAP1, BARD1, BMPR1A, BRCA1, BRCA2, BRIP1, CDC73, CDH1, CDK4, CDKN1B, CDKN2A, CEBPA, CHEK2, CTNNA1, DDX41, DICER1, ETV6, FH, FLCN, GATA2, LZTR1, MAX, MBD4, MEN1, MET, MLH1, MSH2, MSH3, MSH6, MUTYH, NF1, NF2, NTHL1, PALB2, PHOX2B, PMS2, POT1, PRKAR1A, PTCH1, PTEN, RAD51C, RAD51D, RB1, RET, RUNX1, SDHA, SDHAF2, SDHB, SDHC, SDHD, SMAD4, SMARCA4, SMARCB1, SMARCE1, STK11, SUFU, TMEM127, TP53, TSC1, TSC2, VHL, and WT1 (sequencing and deletion/duplication); EGFR, HOXB13, KIT, MITF, PDGFRA, POLD1, and POLE (sequencing only); EPCAM and GREM1 (deletion/duplication only).   The test report has been scanned into EPIC and is located under the Molecular Pathology section of the Results Review tab.  A portion of the result report is included below for reference. Genetic testing reported out on December 20, 2023.      Even though a pathogenic variant was not identified, possible explanations for the cancer in the family may include: There may be no hereditary risk for cancer in the family. The cancers in Tara Campbell and/or her family may be sporadic/familial or due to other genetic and environmental factors.  Most cancer is  not hereditary.  There may be a gene mutation in one of these genes that current testing methods cannot detect but that chance is small. There could be another  gene that has not yet been discovered, or that we have not yet tested, that is responsible for the cancer diagnoses in the family.    Therefore, it is important to remain in touch with cancer genetics in the future so that we can continue to offer Tara Campbell the most up to date genetic testing.    ADDITIONAL GENETIC TESTING:   Tara Campbell genetic testing was fairly extensive.  If there are additional relevant genes identified to increase cancer risk that can be analyzed in the future, we would be happy to discuss and coordinate this testing at that time.    CANCER SCREENING RECOMMENDATIONS:  Tara Campbell test result is considered negative (normal).  This means that we have not identified a hereditary cause for her personal history of breast cancer at this time.   An individual's cancer risk and medical management are not determined by genetic test results alone. Overall cancer risk assessment incorporates additional factors, including personal medical history, family history, and any available genetic information that may result in a personalized plan for cancer prevention and surveillance. Therefore, it is recommended she continue to follow the cancer management and screening guidelines provided by her oncology and primary healthcare provider.   RECOMMENDATIONS FOR FAMILY MEMBERS:   Individuals in this family might be at some increased risk of developing cancer, over the general population risk, due to the family history of cancer.  Individuals in the family should notify their providers of the family history of cancer. We recommend women in this family have a yearly mammogram beginning at age 17, or 56 years younger than the earliest onset of cancer, an annual clinical breast exam, and perform monthly breast self-exams.  Risk models that take into account family history and hormonal history may be helpful in determining appropriate breast cancer screening options for family members.     FOLLOW-UP:  Cancer genetics is a rapidly advancing field and it is possible that new genetic tests will be appropriate for her and/or her family members in the future. We encourage Tara Campbell to remain in contact with cancer genetics, so we can update her personal and family histories and let her know of advances in cancer genetics that may benefit this family.   Our contact number was provided.  They are welcome to call us at anytime with additional questions or concerns.   Tara Campbell M. Rennie Plowman, MS, [facility] Genetic Counselor [email or web address] (P) [phone]

## 2023-12-24 NOTE — Addendum Note (Signed)
Addended by: Cheree Ditto on: 12/24/2023 09:08 AM   Modules accepted: Orders

## 2023-12-26 ENCOUNTER — Encounter: Payer: Self-pay | Admitting: *Deleted

## 2023-12-26 NOTE — Progress Notes (Addendum)
Surgical Instructions   Your procedure is scheduled on Wednesday, January 01, 2024. Report to [facility] Main Entrance "A" at 9:30 A.M., then check in with the Admitting office. Any questions or running late day of surgery: call [phone]  Questions prior to your surgery date: call [phone], Monday-Friday, 8am-4pm. If you experience any cold or flu symptoms such as cough, fever, chills, shortness of breath, etc. between now and your scheduled surgery, please notify us at the above number.     Remember:  Do not eat after midnight the night before your surgery   You may drink clear liquids until 8:30 the morning of your surgery.   Clear liquids allowed are: Water, Non-Citrus Juices (without pulp), Carbonated Beverages, Clear Tea (no milk, honey, etc.), Black Coffee Only (NO MILK, CREAM OR POWDERED CREAMER of any kind), and Gatorade.    Take these medicines the morning of surgery with A SIP OF WATER  allopurinol (ZYLOPRIM)  atenolol (TENORMIN)  Budeson-Glycopyrrol-Formoterol (BREZTRI AEROSPHERE) inhaler  ezetimibe (ZETIA)  fluticasone furoate-vilanterol (BREO ELLIPTA)  isosorbide mononitrate (IMDUR)  levothyroxine (SYNTHROID)  Mepolizumab ([name])  ranolazine (RANEXA)    May take these medicines IF NEEDED: albuterol (VENTOLIN HFA) inhaler EPINEPHrine pen  ipratropium (ATROVENT) nasal spray  ipratropium-albuterol (DUONEB)  nitroGLYCERIN (NITROSTAT)   Per your physician's instruction's, hold your apixaban (ELIQUIS) for 2 days prior to your surgery. Your last dose of apixaban [name]) should be on Sunday, 12-29-23.  One week prior to surgery, STOP taking any Aspirin (unless otherwise instructed by your surgeon) Aleve, Naproxen, Ibuprofen, Motrin, Advil, Goody's, BC's, all herbal medications, fish oil, and non-prescription vitamins.          WHAT DO I DO ABOUT MY DIABETES MEDICATION?   Do not take metFORMIN (GLUCOPHAGE)  the morning of surgery.  Hold your empagliflozin  (JARDIANCE) for 72 hours prior to your surgery.  Your last dose of empagliflozin (JARDIANCE) should be on Saturday, January 25, 25.  Hold your tirzepatide [facility]) for 7 days prior to your surgery.  Your last dose of tirzepatide [facility]) should be on Tuesday, December 24, 2023.      The day of surgery, do not take other diabetes injectables, including Byetta (exenatide), Bydureon (exenatide ER), Victoza (liraglutide), or Trulicity (dulaglutide).   HOW TO MANAGE YOUR DIABETES BEFORE AND AFTER SURGERY  Why is it important to control my blood sugar before and after surgery? Improving blood sugar levels before and after surgery helps healing and can limit problems. A way of improving blood sugar control is eating a healthy diet by:  Eating less sugar and carbohydrates  Increasing activity/exercise  Talking with your doctor about reaching your blood sugar goals High blood sugars (greater than 180 mg/dL) can raise your risk of infections and slow your recovery, so you will need to focus on controlling your diabetes during the weeks before surgery. Make sure that the doctor who takes care of your diabetes knows about your planned surgery including the date and location.  How do I manage my blood sugar before surgery? Check your blood sugar at least 4 times a day, starting 2 days before surgery, to make sure that the level is not too high or low.  Check your blood sugar the morning of your surgery when you wake up and every 2 hours until you get to the Short Stay unit.  If your blood sugar is less than 70 mg/dL, you will need to treat for low blood sugar: Do not take insulin. Treat a low blood sugar (  less than 70 mg/dL) with  cup of clear juice (cranberry or apple), 4 glucose tablets, OR glucose gel. Recheck blood sugar in 15 minutes after treatment (to make sure it is greater than 70 mg/dL). If your blood sugar is not greater than 70 mg/dL on recheck, call [phone] for further  instructions. Report your blood sugar to the short stay nurse when you get to Short Stay.  If you are admitted to the hospital after surgery: Your blood sugar will be checked by the staff and you will probably be given insulin after surgery (instead of oral diabetes medicines) to make sure you have good blood sugar levels. The goal for blood sugar control after surgery is 80-180 mg/dL.               Do NOT Smoke (Tobacco/Vaping) for 24 hours prior to your procedure.  If you use a CPAP at night, you may bring your mask/headgear for your overnight stay.   You will be asked to remove any contacts, glasses, piercing's, hearing aid's, dentures/partials prior to surgery. Please bring cases for these items if needed.    Patients discharged the day of surgery will not be allowed to drive home, and someone needs to stay with them for 24 hours.  SURGICAL WAITING ROOM VISITATION Patients may have no more than 2 support people in the waiting area - these visitors may rotate.   Pre-op nurse will coordinate an appropriate time for 1 ADULT support person, who may not rotate, to accompany patient in pre-op.  Children under the age of 32 must have an adult with them who is not the patient and must remain in the main waiting area with an adult.  If the patient needs to stay at the hospital during part of their recovery, the visitor guidelines for inpatient rooms apply.  Please refer to the [facility] website for the visitor guidelines for any additional information.   If you received a COVID test during your pre-op visit  it is requested that you wear a mask when out in public, stay away from anyone that may not be feeling well and notify your surgeon if you develop symptoms. If you have been in contact with anyone that has tested positive in the last 10 days please notify you surgeon.      Pre-operative CHG Bathing Instructions   You can play a key role in reducing the risk of infection after  surgery. Your skin needs to be as free of germs as possible. You can reduce the number of germs on your skin by washing with CHG (chlorhexidine gluconate) soap before surgery. CHG is an antiseptic soap that kills germs and continues to kill germs even after washing.   DO NOT use if you have an allergy to chlorhexidine/CHG or antibacterial soaps. If your skin becomes reddened or irritated, stop using the CHG and notify one of our RNs at [phone].              TAKE A SHOWER THE NIGHT BEFORE SURGERY AND THE DAY OF SURGERY    Please keep in mind the following:  DO NOT shave, including legs and underarms, 48 hours prior to surgery.   You may shave your face before/day of surgery.  Place clean sheets on your bed the night before surgery Use a clean washcloth (not used since being washed) for each shower. DO NOT sleep with pet's night before surgery.  CHG Shower Instructions:  Wash your face and private area with normal  soap. If you choose to wash your hair, wash first with your normal shampoo.  After you use shampoo/soap, rinse your hair and body thoroughly to remove shampoo/soap residue.  Turn the water OFF and apply half the bottle of CHG soap to a CLEAN washcloth.  Apply CHG soap ONLY FROM YOUR NECK DOWN TO YOUR TOES (washing for 3-5 minutes)  DO NOT use CHG soap on face, private areas, open wounds, or sores.  Pay special attention to the area where your surgery is being performed.  If you are having back surgery, having someone wash your back for you may be helpful. Wait 2 minutes after CHG soap is applied, then you may rinse off the CHG soap.  Pat dry with a clean towel  Put on clean pajamas    Additional instructions for the day of surgery: DO NOT APPLY any lotions, deodorants, cologne, or perfumes.   Do not wear jewelry or makeup Do not wear nail polish, gel polish, artificial nails, or any other type of covering on natural nails (fingers and toes) Do not bring valuables to the  hospital. [facility] is not responsible for valuables/personal belongings. Put on clean/comfortable clothes.  Please brush your teeth.  Ask your nurse before applying any prescription medications to the skin.

## 2023-12-27 ENCOUNTER — Encounter (HOSPITAL_COMMUNITY)
Admission: RE | Admit: 2023-12-27 | Discharge: 2023-12-27 | Disposition: A | Discharge status: Home / Self Care | Payer: Medicare Other | Source: Ambulatory Visit | Attending: General Surgery | Admitting: General Surgery

## 2023-12-27 ENCOUNTER — Other Ambulatory Visit: Payer: Self-pay

## 2023-12-27 ENCOUNTER — Encounter (HOSPITAL_COMMUNITY): Payer: Self-pay

## 2023-12-27 VITALS — BP 172/80 | HR 97 | Temp 97.7°F | Resp 17 | Ht 64.0 in | Wt 189.6 lb

## 2023-12-27 DIAGNOSIS — I480 Paroxysmal atrial fibrillation: Secondary | ICD-10-CM | POA: Diagnosis not present

## 2023-12-27 DIAGNOSIS — E039 Hypothyroidism, unspecified: Secondary | ICD-10-CM | POA: Insufficient documentation

## 2023-12-27 DIAGNOSIS — I10 Essential (primary) hypertension: Secondary | ICD-10-CM | POA: Diagnosis not present

## 2023-12-27 DIAGNOSIS — I2510 Atherosclerotic heart disease of native coronary artery without angina pectoris: Secondary | ICD-10-CM | POA: Diagnosis not present

## 2023-12-27 DIAGNOSIS — Z01812 Encounter for preprocedural laboratory examination: Secondary | ICD-10-CM | POA: Diagnosis present

## 2023-12-27 DIAGNOSIS — Z7901 Long term (current) use of anticoagulants: Secondary | ICD-10-CM | POA: Diagnosis not present

## 2023-12-27 DIAGNOSIS — Z01818 Encounter for other preprocedural examination: Secondary | ICD-10-CM

## 2023-12-27 DIAGNOSIS — Z79899 Other long term (current) drug therapy: Secondary | ICD-10-CM | POA: Insufficient documentation

## 2023-12-27 DIAGNOSIS — J45909 Unspecified asthma, uncomplicated: Secondary | ICD-10-CM | POA: Insufficient documentation

## 2023-12-27 DIAGNOSIS — I252 Old myocardial infarction: Secondary | ICD-10-CM | POA: Insufficient documentation

## 2023-12-27 DIAGNOSIS — E119 Type 2 diabetes mellitus without complications: Secondary | ICD-10-CM | POA: Insufficient documentation

## 2023-12-27 DIAGNOSIS — Z955 Presence of coronary angioplasty implant and graft: Secondary | ICD-10-CM | POA: Diagnosis not present

## 2023-12-27 DIAGNOSIS — Z7982 Long term (current) use of aspirin: Secondary | ICD-10-CM | POA: Diagnosis not present

## 2023-12-27 HISTORY — DX: Pneumonia, unspecified organism: J18.9

## 2023-12-27 HISTORY — DX: Anxiety disorder, unspecified: F41.9

## 2023-12-27 LAB — GLUCOSE, CAPILLARY: Glucose-Capillary: 104 mg/dL — ABNORMAL HIGH (ref 70–99)

## 2023-12-27 NOTE — Progress Notes (Signed)
PCP - Nani Gasser Cardiologist - Dr. Marca Ancona  PPM/ICD - denies Device Orders - n/a Rep Notified -  n/a  Chest x-ray - 10/25/23;  EKG - 08/06/23 Stress Test - 05/23/23 ECHO - 12/27/21 Cardiac Cath - 09/14/22  Sleep Study - denies CPAP - n/a  Fasting Blood Sugar - 120s Checks Blood Sugar once a month  Last dose of GLP1 agonist-  patient has not started taking Mounjaro and will not start it until after the procedure  Patient aware to hold Jardiance for 72 hours prior to the procedure   Blood Thinner Instructions: patient will hold Eliquis for 2 days prior to the procedure Aspirin Instructions: n/a  ERAS Protcol - clears until 0830 PRE-SURGERY Ensure or G2-  G2 as ordered  COVID TEST- n/a   Anesthesia review: yes  Patient denies shortness of breath, fever, cough and chest pain at PAT appointment   All instructions explained to the patient, with a verbal understanding of the material. Patient agrees to go over the instructions while at home for a better understanding. Patient also instructed to self quarantine after being tested for COVID-19. The opportunity to ask questions was provided.    Patient states that she had pneumonia in December but has recovered.  Patient also stated that she did have to take nitroglycerin when she had pneumonia but has followed up with cardiology since.  Anesthesia made aware while patient was at PAT appointment.

## 2023-12-27 NOTE — Progress Notes (Signed)
Surgical Instructions     Your procedure is scheduled on Wednesday, January 01, 2024. Report to [facility] Main Entrance "A" at 9:30 A.M., then check in with the Admitting office. Any questions or running late day of surgery: call [phone]   Questions prior to your surgery date: call [phone], Monday-Friday, 8am-4pm. If you experience any cold or flu symptoms such as cough, fever, chills, shortness of breath, etc. between now and your scheduled surgery, please notify us at the above number.            Remember:       Do not eat after midnight the night before your surgery     You may drink clear liquids until 8:30 the morning of your surgery.   Clear liquids allowed are: Water, Non-Citrus Juices (without pulp), Carbonated Beverages, Clear Tea (no milk, honey, etc.), Black Coffee Only (NO MILK, CREAM OR POWDERED CREAMER of any kind), and Gatorade.  Patient Instructions  The night before surgery:  No food after midnight. ONLY clear liquids after midnight  The day of surgery (if you have diabetes): Drink ONE (1) 12 oz G2 given to you in your pre admission testing appointment by 8:30 the morning of surgery. Drink in one sitting. Do not sip.  This drink was given to you during your hospital  pre-op appointment visit.  Nothing else to drink after completing the  12 oz bottle of G2.         If you have questions, please contact your surgeon's office.           Take these medicines the morning of surgery with A SIP OF WATER  allopurinol (ZYLOPRIM)  atenolol (TENORMIN)  Budeson-Glycopyrrol-Formoterol (BREZTRI AEROSPHERE) inhaler  ezetimibe (ZETIA)  fluticasone furoate-vilanterol (BREO ELLIPTA)  isosorbide mononitrate (IMDUR)  levothyroxine (SYNTHROID)  Mepolizumab ([name])  ranolazine (RANEXA)      May take these medicines IF NEEDED: albuterol (VENTOLIN HFA) inhaler EPINEPHrine pen  ipratropium (ATROVENT) nasal spray  ipratropium-albuterol (DUONEB)  nitroGLYCERIN  (NITROSTAT)    Per your physician's instruction's, hold your apixaban (ELIQUIS) for 2 days prior to your surgery. Your last dose of apixaban [name]) should be on Sunday, 12-29-23.   One week prior to surgery, STOP taking any Aspirin (unless otherwise instructed by your surgeon) Aleve, Naproxen, Ibuprofen, Motrin, Advil, Goody's, BC's, all herbal medications, fish oil, and non-prescription vitamins.          WHAT DO I DO ABOUT MY DIABETES MEDICATION?     Do not take metFORMIN (GLUCOPHAGE)  the morning of surgery.   Hold your empagliflozin (JARDIANCE) for 72 hours prior to your surgery.  Your last dose of empagliflozin (JARDIANCE) should be on Saturday, January 25, 25.   Hold your tirzepatide [facility]) for 7 days prior to your surgery.  Your last dose of tirzepatide [facility]) should be on Tuesday, December 24, 2023.       The day of surgery, do not take other diabetes injectables, including Byetta (exenatide), Bydureon (exenatide ER), Victoza (liraglutide), or Trulicity (dulaglutide).     HOW TO MANAGE YOUR DIABETES BEFORE AND AFTER SURGERY   Why is it important to control my blood sugar before and after surgery? Improving blood sugar levels before and after surgery helps healing and can limit problems. A way of improving blood sugar control is eating a healthy diet by:  Eating less sugar and carbohydrates  Increasing activity/exercise  Talking with your doctor about reaching your blood sugar goals High blood sugars (greater than 180 mg/dL) can  raise your risk of infections and slow your recovery, so you will need to focus on controlling your diabetes during the weeks before surgery. Make sure that the doctor who takes care of your diabetes knows about your planned surgery including the date and location.   How do I manage my blood sugar before surgery? Check your blood sugar at least 4 times a day, starting 2 days before surgery, to make sure that the level is not too high or low.    Check your blood sugar the morning of your surgery when you wake up and every 2 hours until you get to the Short Stay unit.   If your blood sugar is less than 70 mg/dL, you will need to treat for low blood sugar: Do not take insulin. Treat a low blood sugar (less than 70 mg/dL) with  cup of clear juice (cranberry or apple), 4 glucose tablets, OR glucose gel. Recheck blood sugar in 15 minutes after treatment (to make sure it is greater than 70 mg/dL). If your blood sugar is not greater than 70 mg/dL on recheck, call [phone] for further instructions. Report your blood sugar to the short stay nurse when you get to Short Stay.   If you are admitted to the hospital after surgery: Your blood sugar will be checked by the staff and you will probably be given insulin after surgery (instead of oral diabetes medicines) to make sure you have good blood sugar levels. The goal for blood sugar control after surgery is 80-180 mg/dL.                  Do NOT Smoke (Tobacco/Vaping) for 24 hours prior to your procedure.   If you use a CPAP at night, you may bring your mask/headgear for your overnight stay.   You will be asked to remove any contacts, glasses, piercing's, hearing aid's, dentures/partials prior to surgery. Please bring cases for these items if needed.    Patients discharged the day of surgery will not be allowed to drive home, and someone needs to stay with them for 24 hours.   SURGICAL WAITING ROOM VISITATION Patients may have no more than 2 support people in the waiting area - these visitors may rotate.   Pre-op nurse will coordinate an appropriate time for 1 ADULT support person, who may not rotate, to accompany patient in pre-op.  Children under the age of 33 must have an adult with them who is not the patient and must remain in the main waiting area with an adult.   If the patient needs to stay at the hospital during part of their recovery, the visitor guidelines for inpatient  rooms apply.   Please refer to the [facility] website for the visitor guidelines for any additional information.     If you received a COVID test during your pre-op visit  it is requested that you wear a mask when out in public, stay away from anyone that may not be feeling well and notify your surgeon if you develop symptoms. If you have been in contact with anyone that has tested positive in the last 10 days please notify you surgeon.         Pre-operative CHG Bathing Instructions    You can play a key role in reducing the risk of infection after surgery. Your skin needs to be as free of germs as possible. You can reduce the number of germs on your skin by washing with CHG (chlorhexidine gluconate) soap  before surgery. CHG is an antiseptic soap that kills germs and continues to kill germs even after washing.    DO NOT use if you have an allergy to chlorhexidine/CHG or antibacterial soaps. If your skin becomes reddened or irritated, stop using the CHG and notify one of our RNs at [phone].               TAKE A SHOWER THE NIGHT BEFORE SURGERY AND THE DAY OF SURGERY     Please keep in mind the following:  DO NOT shave, including legs and underarms, 48 hours prior to surgery.   You may shave your face before/day of surgery.  Place clean sheets on your bed the night before surgery Use a clean washcloth (not used since being washed) for each shower. DO NOT sleep with pet's night before surgery.   CHG Shower Instructions:  Wash your face and private area with normal soap. If you choose to wash your hair, wash first with your normal shampoo.  After you use shampoo/soap, rinse your hair and body thoroughly to remove shampoo/soap residue.  Turn the water OFF and apply half the bottle of CHG soap to a CLEAN washcloth.  Apply CHG soap ONLY FROM YOUR NECK DOWN TO YOUR TOES (washing for 3-5 minutes)  DO NOT use CHG soap on face, private areas, open wounds, or sores.  Pay special attention to  the area where your surgery is being performed.  If you are having back surgery, having someone wash your back for you may be helpful. Wait 2 minutes after CHG soap is applied, then you may rinse off the CHG soap.  Pat dry with a clean towel  Put on clean pajamas     Additional instructions for the day of surgery: DO NOT APPLY any lotions, deodorants, cologne, or perfumes.   Do not wear jewelry or makeup Do not wear nail polish, gel polish, artificial nails, or any other type of covering on natural nails (fingers and toes) Do not bring valuables to the hospital. [facility] is not responsible for valuables/personal belongings. Put on clean/comfortable clothes.  Please brush your teeth.  Ask your nurse before applying any prescription medications to the skin.

## 2023-12-30 NOTE — Anesthesia Preprocedure Evaluation (Signed)
Anesthesia Evaluation  Patient identified by MRN, date of birth, ID band Patient awake    Reviewed: Allergy & Precautions, H&P , NPO status , Patient's Chart, lab work & pertinent test results  Airway Mallampati: II  TM Distance: >3 FB Neck ROM: Full    Dental no notable dental hx.    Pulmonary asthma    Pulmonary exam normal breath sounds clear to auscultation       Cardiovascular hypertension, Pt. on medications + CAD and + Past MI  Normal cardiovascular exam Rhythm:Regular Rate:Normal     Neuro/Psych   Anxiety     TIA negative psych ROS   GI/Hepatic negative GI ROS, Neg liver ROS,,,  Endo/Other  diabetes, Type 2Hypothyroidism    Renal/GU Renal InsufficiencyRenal disease  negative genitourinary   Musculoskeletal  (+) Arthritis , Osteoarthritis,    Abdominal  (+) + obese  Peds negative pediatric ROS (+)  Hematology negative hematology ROS (+)   Anesthesia Other Findings   Reproductive/Obstetrics negative OB ROS                             Anesthesia Physical Anesthesia Plan  ASA: 3  Anesthesia Plan: General   Post-op Pain Management: Minimal or no pain anticipated   Induction: Intravenous  PONV Risk Score and Plan: 3 and Ondansetron, Dexamethasone, Midazolam and Treatment may vary due to age or medical condition  Airway Management Planned: LMA  Additional Equipment:   Intra-op Plan:   Post-operative Plan: Extubation in OR  Informed Consent: I have reviewed the patients History and Physical, chart, labs and discussed the procedure including the risks, benefits and alternatives for the proposed anesthesia with the patient or authorized representative who has indicated his/her understanding and acceptance.     Dental advisory given  Plan Discussed with: CRNA  Anesthesia Plan Comments: (PAT note by Antionette Poles, PA-C:  78 year old female follows with cardiology for  history of CAD s/p DES x 2 to the RCA in 2017 and DES to the LAD in 08/2022, PAF on Eliquis, HTN. NSTEMI in 5/22, LHC showed severe complex stenosis proximal/mid/distal LAD with patent RCA stents.  LAD was unfavorable for PCI with marked tortuosity and small caliber, also poor target for CABG due to small size.  Medical management initially.  Patient had worsening of angina and cardiac PET was done that was high risk.  She then had cath in 9/23 with difficult PCI to proximal LAD with DES and PTCA mid LAD.  There was residual 90% stenosis in the mid/distal LAD that could not be approached due to tortuosity.  She had NSTEMI in 10/23 with repeat cath unchanged from 9/23 with patent stent, 50% mLAD stenosis at site of PTCA, and 90% mid-distal LAD (not approachable percutaneously). Negative nuclear stress test in 6/24 in [place] for atypical chest pain.   Patient was last seen in cardiology follow-up by [place] Lee, NP on 12/12/2023.  Noted to be doing well at that time, NYHA I, no changes to management.  Upcoming surgery was also discussed.  Per note, "Cleared for surgery from a heart failure perspective. Okay to hold Eliquis for procedure."  Patient reports she will hold Eliquis 2 days prior to procedure.  Other pertinent history includes non-insulin-dependent DM2, hypothyroidism, asthma (maintained on [place] and Breztri).  Received prednisone x 2 in December 2024 for asthma exacerbations.  CMP and CBC from 12/11/2023 reviewed, creatinine mildly elevated 1.28, glucose elevated to 279, otherwise unremarkable.  A1c  7.6 on 12/18/2023.  EKG 08/06/2023: NSR.  Rate 95.  Low-voltage QRS.  Cath 09/14/2022: 1.  Patent left main with no stenosis 2.  Patent circumflex with mild nonobstructive plaquing 3.  Patent RCA with 50% proximal stenosis and a patent stent in the mid vessel, unchanged from the recent procedure 4.  Continued patency of the proximal LAD stent and moderate stenosis of the mid LAD at the previous  angioplasty site, area not favorable for PCI due to calcification, angulation, and small vessel caliber.  Recommendations: Medical therapy.  Resume apixaban tomorrow morning.  Continue aspirin for at least another 30 days and clopidogrel for at least 6 months.  TTE 12/27/2021: 1. Left ventricular ejection fraction, by estimation, is 60 to 65%. The  left ventricle has normal function. The left ventricle has no regional  wall motion abnormalities. Left ventricular diastolic parameters are  consistent with Grade I diastolic  dysfunction (impaired relaxation).  2. Right ventricular systolic function is normal. The right ventricular  size is normal. Tricuspid regurgitation signal is inadequate for assessing  PA pressure.  3. The mitral valve is normal in structure. No evidence of mitral valve  regurgitation. No evidence of mitral stenosis.  4. The aortic valve is tricuspid. Aortic valve regurgitation is not  visualized. No aortic stenosis is present.  5. The inferior vena cava is normal in size with greater than 50%  respiratory variability, suggesting right atrial pressure of 3 mmHg.    )        Anesthesia Quick Evaluation

## 2023-12-30 NOTE — Progress Notes (Signed)
Anesthesia Chart Review:  78 year old female follows with cardiology for history of CAD s/p DES x 2 to the RCA in 2017 and DES to the LAD in 08/2022, PAF on Eliquis, HTN. NSTEMI in 5/22, LHC showed severe complex stenosis proximal/mid/distal LAD with patent RCA stents.  LAD was unfavorable for PCI with marked tortuosity and small caliber, also poor target for CABG due to small size.  Medical management initially.  Patient had worsening of angina and cardiac PET was done that was high risk.  She then had cath in 9/23 with difficult PCI to proximal LAD with DES and PTCA mid LAD.  There was residual 90% stenosis in the mid/distal LAD that could not be approached due to tortuosity.  She had NSTEMI in 10/23 with repeat cath unchanged from 9/23 with patent stent, 50% mLAD stenosis at site of PTCA, and 90% mid-distal LAD (not approachable percutaneously). Negative nuclear stress test in 6/24 in [place] for atypical chest pain.   Patient was last seen in cardiology follow-up by [place] Lee, NP on 12/12/2023.  Noted to be doing well at that time, NYHA I, no changes to management.  Upcoming surgery was also discussed.  Per note, "Cleared for surgery from a heart failure perspective. Okay to hold Eliquis for procedure."  Patient reports she will hold Eliquis 2 days prior to procedure.  Other pertinent history includes non-insulin-dependent DM2, hypothyroidism, asthma (maintained on [place] and Brez[place]).  Received prednisone x 2 in December 2024 for asthma exacerbations.  CMP and CBC from 12/11/2023 reviewed, creatinine mildly elevated 1.28, glucose elevated to 279, otherwise unremarkable.  A1c 7.6 on 12/18/2023.  EKG 08/06/2023: NSR.  Rate 95.  Low-voltage QRS.  Cath 09/14/2022: 1.  Patent left main with no stenosis 2.  Patent circumflex with mild nonobstructive plaquing 3.  Patent RCA with 50% proximal stenosis and a patent stent in the mid vessel, unchanged from the recent procedure 4.  Continued patency of the  proximal LAD stent and moderate stenosis of the mid LAD at the previous angioplasty site, area not favorable for PCI due to calcification, angulation, and small vessel caliber.   Recommendations: Medical therapy.  Resume apixaban tomorrow morning.  Continue aspirin for at least another 30 days and clopidogrel for at least 6 months.  TTE 12/27/2021:  1. Left ventricular ejection fraction, by estimation, is 60 to 65%. The  left ventricle has normal function. The left ventricle has no regional  wall motion abnormalities. Left ventricular diastolic parameters are  consistent with Grade I diastolic  dysfunction (impaired relaxation).   2. Right ventricular systolic function is normal. The right ventricular  size is normal. Tricuspid regurgitation signal is inadequate for assessing  PA pressure.   3. The mitral valve is normal in structure. No evidence of mitral valve  regurgitation. No evidence of mitral stenosis.   4. The aortic valve is tricuspid. Aortic valve regurgitation is not  visualized. No aortic stenosis is present.   5. The inferior vena cava is normal in size with greater than 50%  respiratory variability, suggesting right atrial pressure of 3 mmHg.     Zannie Cove [facility] Short Stay Center/Anesthesiology Phone [phone] 12/30/2023 1:38 PM

## 2023-12-31 ENCOUNTER — Other Ambulatory Visit: Payer: Self-pay | Admitting: Family Medicine

## 2023-12-31 ENCOUNTER — Ambulatory Visit
Admission: RE | Admit: 2023-12-31 | Discharge: 2023-12-31 | Disposition: A | Discharge status: Home / Self Care | Payer: Medicare Other | Source: Ambulatory Visit | Attending: General Surgery | Admitting: General Surgery

## 2023-12-31 DIAGNOSIS — Z170 Estrogen receptor positive status [ER+]: Secondary | ICD-10-CM

## 2023-12-31 DIAGNOSIS — I480 Paroxysmal atrial fibrillation: Secondary | ICD-10-CM

## 2023-12-31 HISTORY — PX: BREAST BIOPSY: SHX20

## 2024-01-01 ENCOUNTER — Other Ambulatory Visit: Payer: Self-pay

## 2024-01-01 ENCOUNTER — Ambulatory Visit
Admission: RE | Admit: 2024-01-01 | Discharge: 2024-01-01 | Disposition: A | Discharge status: Home / Self Care | Payer: Medicare Other | Source: Ambulatory Visit | Attending: General Surgery | Admitting: General Surgery

## 2024-01-01 ENCOUNTER — Encounter (HOSPITAL_COMMUNITY): Payer: Self-pay | Admitting: General Surgery

## 2024-01-01 ENCOUNTER — Encounter (HOSPITAL_COMMUNITY)
Admission: RE | Disposition: A | Discharge status: Home / Self Care | Payer: Self-pay | Source: Home / Self Care | Attending: General Surgery

## 2024-01-01 ENCOUNTER — Ambulatory Visit (HOSPITAL_COMMUNITY): Payer: Medicare Other | Admitting: Vascular Surgery

## 2024-01-01 ENCOUNTER — Ambulatory Visit (HOSPITAL_COMMUNITY)
Admission: RE | Admit: 2024-01-01 | Discharge: 2024-01-01 | Disposition: A | Discharge status: Home / Self Care | Payer: Medicare Other | Attending: General Surgery | Admitting: General Surgery

## 2024-01-01 ENCOUNTER — Ambulatory Visit (HOSPITAL_BASED_OUTPATIENT_CLINIC_OR_DEPARTMENT_OTHER): Payer: Medicare Other | Admitting: Anesthesiology

## 2024-01-01 DIAGNOSIS — E119 Type 2 diabetes mellitus without complications: Secondary | ICD-10-CM | POA: Diagnosis not present

## 2024-01-01 DIAGNOSIS — Z170 Estrogen receptor positive status [ER+]: Secondary | ICD-10-CM | POA: Insufficient documentation

## 2024-01-01 DIAGNOSIS — I2510 Atherosclerotic heart disease of native coronary artery without angina pectoris: Secondary | ICD-10-CM | POA: Insufficient documentation

## 2024-01-01 DIAGNOSIS — E039 Hypothyroidism, unspecified: Secondary | ICD-10-CM | POA: Diagnosis not present

## 2024-01-01 DIAGNOSIS — Z7951 Long term (current) use of inhaled steroids: Secondary | ICD-10-CM | POA: Diagnosis not present

## 2024-01-01 DIAGNOSIS — N189 Chronic kidney disease, unspecified: Secondary | ICD-10-CM | POA: Insufficient documentation

## 2024-01-01 DIAGNOSIS — C50411 Malignant neoplasm of upper-outer quadrant of right female breast: Secondary | ICD-10-CM

## 2024-01-01 DIAGNOSIS — Z79890 Hormone replacement therapy (postmenopausal): Secondary | ICD-10-CM | POA: Diagnosis not present

## 2024-01-01 DIAGNOSIS — Z955 Presence of coronary angioplasty implant and graft: Secondary | ICD-10-CM | POA: Diagnosis not present

## 2024-01-01 DIAGNOSIS — Z8673 Personal history of transient ischemic attack (TIA), and cerebral infarction without residual deficits: Secondary | ICD-10-CM | POA: Diagnosis not present

## 2024-01-01 DIAGNOSIS — E1122 Type 2 diabetes mellitus with diabetic chronic kidney disease: Secondary | ICD-10-CM | POA: Diagnosis not present

## 2024-01-01 DIAGNOSIS — J4550 Severe persistent asthma, uncomplicated: Secondary | ICD-10-CM | POA: Insufficient documentation

## 2024-01-01 DIAGNOSIS — I252 Old myocardial infarction: Secondary | ICD-10-CM | POA: Diagnosis not present

## 2024-01-01 DIAGNOSIS — Z7984 Long term (current) use of oral hypoglycemic drugs: Secondary | ICD-10-CM | POA: Diagnosis not present

## 2024-01-01 DIAGNOSIS — Z01818 Encounter for other preprocedural examination: Secondary | ICD-10-CM

## 2024-01-01 DIAGNOSIS — I10 Essential (primary) hypertension: Secondary | ICD-10-CM

## 2024-01-01 DIAGNOSIS — C50911 Malignant neoplasm of unspecified site of right female breast: Secondary | ICD-10-CM | POA: Insufficient documentation

## 2024-01-01 DIAGNOSIS — Z7901 Long term (current) use of anticoagulants: Secondary | ICD-10-CM | POA: Insufficient documentation

## 2024-01-01 DIAGNOSIS — I129 Hypertensive chronic kidney disease with stage 1 through stage 4 chronic kidney disease, or unspecified chronic kidney disease: Secondary | ICD-10-CM | POA: Insufficient documentation

## 2024-01-01 HISTORY — PX: BREAST LUMPECTOMY WITH RADIOACTIVE SEED LOCALIZATION: SHX6424

## 2024-01-01 LAB — GLUCOSE, CAPILLARY
Glucose-Capillary: 139 mg/dL — ABNORMAL HIGH (ref 70–99)
Glucose-Capillary: 129 mg/dL — ABNORMAL HIGH (ref 70–99)

## 2024-01-01 SURGERY — BREAST LUMPECTOMY WITH RADIOACTIVE SEED LOCALIZATION
Anesthesia: General | Site: Breast | Laterality: Right

## 2024-01-01 MED ORDER — MIDAZOLAM HCL 2 MG/2ML IJ SOLN
INTRAMUSCULAR | Status: AC
  Filled 2024-01-01: qty 2

## 2024-01-01 MED ORDER — BUPIVACAINE-EPINEPHRINE 0.25% -1:200000 IJ SOLN
INTRAMUSCULAR | Status: DC | PRN
  Administered 2024-01-01: 10 mL

## 2024-01-01 MED ORDER — HYDROMORPHONE HCL 1 MG/ML IJ SOLN: 0.2500 mg | INTRAMUSCULAR | Status: DC | PRN

## 2024-01-01 MED ORDER — OXYCODONE HCL 5 MG PO TABS: 5.0000 mg | ORAL_TABLET | ORAL | Status: DC | PRN

## 2024-01-01 MED ORDER — OXYCODONE HCL 5 MG PO TABS
ORAL_TABLET | ORAL | Status: AC
  Filled 2024-01-01: qty 1

## 2024-01-01 MED ORDER — SODIUM CHLORIDE 0.9 % IV SOLN
12.5000 mg | INTRAVENOUS | Status: DC | PRN
  Filled 2024-01-01: qty 0.5

## 2024-01-01 MED ORDER — CEFAZOLIN SODIUM-DEXTROSE 2-4 GM/100ML-% IV SOLN
2.0000 g | INTRAVENOUS | Status: AC
  Administered 2024-01-01: 2 g via INTRAVENOUS
  Filled 2024-01-01: qty 100

## 2024-01-01 MED ORDER — ORAL CARE MOUTH RINSE: 15.0000 mL | Freq: Once | OROMUCOSAL | Status: AC

## 2024-01-01 MED ORDER — ONDANSETRON HCL 4 MG/2ML IJ SOLN
INTRAMUSCULAR | Status: AC
  Filled 2024-01-01: qty 2

## 2024-01-01 MED ORDER — PHENYLEPHRINE HCL-NACL 20-0.9 MG/250ML-% IV SOLN
INTRAVENOUS | Status: DC | PRN
  Administered 2024-01-01: 75 ug/min via INTRAVENOUS

## 2024-01-01 MED ORDER — PHENYLEPHRINE 80 MCG/ML (10ML) SYRINGE FOR IV PUSH (FOR BLOOD PRESSURE SUPPORT)
PREFILLED_SYRINGE | INTRAVENOUS | Status: DC | PRN
  Administered 2024-01-01: 160 ug via INTRAVENOUS

## 2024-01-01 MED ORDER — LACTATED RINGERS IV SOLN: INTRAVENOUS | Status: DC

## 2024-01-01 MED ORDER — SODIUM CHLORIDE 0.9% FLUSH
3.0000 mL | Freq: Two times a day (BID) | INTRAVENOUS | Status: DC
Start: 2024-01-01 — End: 2024-01-01

## 2024-01-01 MED ORDER — 0.9 % SODIUM CHLORIDE (POUR BTL) OPTIME
TOPICAL | Status: DC | PRN
  Administered 2024-01-01: 1000 mL

## 2024-01-01 MED ORDER — ACETAMINOPHEN 650 MG RE SUPP: 650.0000 mg | RECTAL | Status: DC | PRN

## 2024-01-01 MED ORDER — SODIUM CHLORIDE 0.9% FLUSH: 3.0000 mL | INTRAVENOUS | Status: DC | PRN

## 2024-01-01 MED ORDER — CHLORHEXIDINE GLUCONATE CLOTH 2 % EX PADS: 6.0000 | MEDICATED_PAD | Freq: Once | CUTANEOUS | Status: DC

## 2024-01-01 MED ORDER — ONDANSETRON HCL 4 MG/2ML IJ SOLN
INTRAMUSCULAR | Status: DC | PRN
  Administered 2024-01-01: 4 mg via INTRAVENOUS

## 2024-01-01 MED ORDER — OXYCODONE HCL 5 MG PO TABS
5.0000 mg | ORAL_TABLET | Freq: Once | ORAL | Status: AC | PRN
  Administered 2024-01-01: 5 mg via ORAL

## 2024-01-01 MED ORDER — ACETAMINOPHEN 500 MG PO TABS
1000.0000 mg | ORAL_TABLET | ORAL | Status: AC
  Administered 2024-01-01: 1000 mg via ORAL
  Filled 2024-01-01: qty 2

## 2024-01-01 MED ORDER — OXYCODONE HCL 5 MG/5ML PO SOLN: 5.0000 mg | Freq: Once | ORAL | Status: AC | PRN

## 2024-01-01 MED ORDER — PROPOFOL 10 MG/ML IV BOLUS
INTRAVENOUS | Status: DC | PRN
  Administered 2024-01-01: 50 mg via INTRAVENOUS
  Administered 2024-01-01: 150 mg via INTRAVENOUS

## 2024-01-01 MED ORDER — DEXAMETHASONE SODIUM PHOSPHATE 10 MG/ML IJ SOLN
INTRAMUSCULAR | Status: DC | PRN
  Administered 2024-01-01: 5 mg via INTRAVENOUS

## 2024-01-01 MED ORDER — PROPOFOL 10 MG/ML IV BOLUS
INTRAVENOUS | Status: AC
  Filled 2024-01-01: qty 20

## 2024-01-01 MED ORDER — LIDOCAINE 2% (20 MG/ML) 5 ML SYRINGE
INTRAMUSCULAR | Status: DC | PRN
  Administered 2024-01-01: 60 mg via INTRAVENOUS

## 2024-01-01 MED ORDER — ACETAMINOPHEN 325 MG PO TABS: 650.0000 mg | ORAL_TABLET | ORAL | Status: DC | PRN

## 2024-01-01 MED ORDER — CHLORHEXIDINE GLUCONATE 0.12 % MT SOLN
15.0000 mL | Freq: Once | OROMUCOSAL | Status: AC
Start: 2024-01-01 — End: 2024-01-01
  Administered 2024-01-01: 15 mL via OROMUCOSAL
  Filled 2024-01-01: qty 15

## 2024-01-01 MED ORDER — SODIUM CHLORIDE 0.9 % IV SOLN: 250.0000 mL | INTRAVENOUS | Status: DC | PRN

## 2024-01-01 MED ORDER — AMISULPRIDE (ANTIEMETIC) 5 MG/2ML IV SOLN: 10.0000 mg | Freq: Once | INTRAVENOUS | Status: DC | PRN

## 2024-01-01 MED ORDER — BUPIVACAINE-EPINEPHRINE (PF) 0.25% -1:200000 IJ SOLN
INTRAMUSCULAR | Status: AC
Start: 2024-01-01 — End: ?
  Filled 2024-01-01: qty 30

## 2024-01-01 MED ORDER — HEMOSTATIC AGENTS (NO CHARGE) OPTIME
TOPICAL | Status: DC | PRN
  Administered 2024-01-01: 1

## 2024-01-01 MED ORDER — LIDOCAINE 2% (20 MG/ML) 5 ML SYRINGE
INTRAMUSCULAR | Status: AC
  Filled 2024-01-01: qty 5

## 2024-01-01 MED ORDER — MIDAZOLAM HCL 2 MG/2ML IJ SOLN
INTRAMUSCULAR | Status: DC | PRN
  Administered 2024-01-01: 1 mg via INTRAVENOUS

## 2024-01-01 MED ORDER — FENTANYL CITRATE (PF) 250 MCG/5ML IJ SOLN
INTRAMUSCULAR | Status: AC
  Filled 2024-01-01: qty 5

## 2024-01-01 MED ORDER — ENSURE PRE-SURGERY PO LIQD: 296.0000 mL | Freq: Once | ORAL | Status: DC

## 2024-01-01 MED ORDER — DEXAMETHASONE SODIUM PHOSPHATE 10 MG/ML IJ SOLN
INTRAMUSCULAR | Status: AC
  Filled 2024-01-01: qty 1

## 2024-01-01 MED ORDER — INSULIN ASPART 100 UNIT/ML IJ SOLN: 0.0000 [IU] | INTRAMUSCULAR | Status: DC | PRN

## 2024-01-01 MED ORDER — FENTANYL CITRATE (PF) 250 MCG/5ML IJ SOLN
INTRAMUSCULAR | Status: DC | PRN
  Administered 2024-01-01: 25 ug via INTRAVENOUS

## 2024-01-01 SURGICAL SUPPLY — 31 items
BAG COUNTER SPONGE SURGICOUNT (BAG) ×1 IMPLANT
BINDER BREAST XXLRG (GAUZE/BANDAGES/DRESSINGS) IMPLANT
CANISTER SUCT 3000ML PPV (MISCELLANEOUS) ×1 IMPLANT
CHLORAPREP W/TINT 26 (MISCELLANEOUS) ×1 IMPLANT
CLIP TI MEDIUM 6 (CLIP) ×1 IMPLANT
COVER PROBE W GEL 5X96 (DRAPES) ×1 IMPLANT
COVER SURGICAL LIGHT HANDLE (MISCELLANEOUS) ×1 IMPLANT
DERMABOND ADVANCED .7 DNX12 (GAUZE/BANDAGES/DRESSINGS) ×1 IMPLANT
DEVICE DUBIN SPECIMEN MAMMOGRA (MISCELLANEOUS) ×1 IMPLANT
DRAPE CHEST BREAST 15X10 FENES (DRAPES) ×1 IMPLANT
ELECT COATED BLADE 2.86 ST (ELECTRODE) ×1 IMPLANT
ELECT REM PT RETURN 9FT ADLT (ELECTROSURGICAL) ×1
ELECTRODE REM PT RTRN 9FT ADLT (ELECTROSURGICAL) ×1 IMPLANT
GLOVE BIO SURGEON STRL SZ7 (GLOVE) ×2 IMPLANT
GLOVE BIOGEL PI IND STRL 7.5 (GLOVE) ×1 IMPLANT
GOWN STRL REUS W/ TWL LRG LVL3 (GOWN DISPOSABLE) ×2 IMPLANT
KIT BASIN OR (CUSTOM PROCEDURE TRAY) ×1 IMPLANT
KIT MARKER MARGIN INK (KITS) ×1 IMPLANT
NDL HYPO 25GX1X1/2 BEV (NEEDLE) ×1 IMPLANT
NEEDLE HYPO 25GX1X1/2 BEV (NEEDLE) ×1
NS IRRIG 1000ML POUR BTL (IV SOLUTION) ×1 IMPLANT
PACK GENERAL/GYN (CUSTOM PROCEDURE TRAY) ×1 IMPLANT
STRIP CLOSURE SKIN 1/2X4 (GAUZE/BANDAGES/DRESSINGS) ×1 IMPLANT
SUT MNCRL AB 4-0 PS2 18 (SUTURE) ×1 IMPLANT
SUT MON AB 5-0 PS2 18 (SUTURE) IMPLANT
SUT SILK 2 0 SH (SUTURE) IMPLANT
SUT VIC AB 2-0 SH 27XBRD (SUTURE) ×1 IMPLANT
SUT VIC AB 3-0 SH 27X BRD (SUTURE) ×1 IMPLANT
SYR CONTROL 10ML LL (SYRINGE) ×1 IMPLANT
TOWEL GREEN STERILE (TOWEL DISPOSABLE) ×1 IMPLANT
TOWEL GREEN STERILE FF (TOWEL DISPOSABLE) ×1 IMPLANT

## 2024-01-01 NOTE — H&P (Signed)
61 yof who had screening mm that shows a right breast mass. This measures 8x7x6 mm. Ax [place] is negative. 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 She is here with her sister. She is retired.  She is interested in a reduction. She is on eliquis and has CAD with prior stents.   Review of Systems: A complete review of systems was obtained from the patient. I have reviewed this information and discussed as appropriate with the patient. See HPI as well for other ROS.  Review of Systems  All other systems reviewed and are negative.   Medical History: Past Medical History:  Diagnosis Date  Arthritis  Asthma, unspecified asthma severity, unspecified whether complicated, unspecified whether persistent (HHS-HCC)  Diabetes mellitus without complication (CMS/HHS-HCC)  Glaucoma (increased eye pressure)  History of cancer  Hyperlipidemia  Hypertension  Thyroid disease   Patient Active Problem List  Diagnosis  Controlled diabetes mellitus type 2 with complications (CMS/HHS-HCC)  Malignant neoplasm of upper-outer quadrant of right breast in female, estrogen receptor positive (CMS/HHS-HCC)  Acquired hypothyroidism  Non-ST elevation (NSTEMI) myocardial infarction (CMS/HHS-HCC)  Anticoagulant long-term use  Cardiomegaly  Class 1 obesity due to excess calories with serious comorbidity in adult  Dupuytren's contracture of left hand  GAD (generalized anxiety disorder)  Gout of ankle  Mediastinal adenopathy  History of non-ST elevation myocardial infarction (NSTEMI)  Hyperlipidemia  Hypertension goal BP (blood pressure) < 140/90  Hypokalemia  Memory impairment  Mild stage glaucoma  Paroxysmal atrial fibrillation (CMS/HHS-HCC)  TIA (transient ischemic attack)  Trigger finger, left index finger  Type 2 diabetes mellitus with diabetic chronic kidney disease (CMS/HHS-HCC)  Severe persistent asthma with exacerbation (CMS/HHS-HCC)  Sensorineural  hearing loss (SNHL) of both ears  Primary osteoarthritis of right knee  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  Non-seasonal allergic rhinitis   Past Surgical History:  Procedure Laterality Date  colonoscopy w/ polyectomy N/A 01/04/2015  Left heart cath and coronary angiography Left 04/26/2021  Coronary stent intervention N/A 08/31/2022  Left Heart Cath and coronary angiography N/A 09/14/2022  . Right Breast Biopsy Right 12/03/2023  .Angioplasty N/A  Date unknown  Cardiac cath N/A  Date unknown  Hemorrhoid banding N/A  Date unknown    Allergies  Allergen Reactions  Latex Hives and Rash  HIVES  Sulfa (Sulfonamide Antibiotics) Hives and Rash  HIVES  Sulfamethoxazole Rash  Sertraline Other (See Comments)  Nausea/vomiting  Adhesive Rash  Vinyl;tape   Current Outpatient Medications on File Prior to Visit  Medication Sig Dispense Refill  acetaminophen (TYLENOL) 650 MG ER tablet Take 650 mg by mouth every 8 (eight) hours as needed  albuterol MDI, PROVENTIL, VENTOLIN, PROAIR, HFA 90 mcg/actuation inhaler Inhale 2 inhalations into the lungs every 6 (six) hours as needed  allopurinoL (ZYLOPRIM) 300 MG tablet Take 300 mg by mouth once daily  ALPRAZolam (XANAX) 0.25 MG tablet Take 0.25 mg by mouth 2 (two) times daily as needed  apixaban (ELIQUIS) 5 mg tablet Take 5 mg by mouth 2 (two) times daily  apple cider vinegar 300 mg Tab Take 300 mg by mouth once daily  aspirin 81 MG EC tablet Take 81 mg by mouth once daily  atenoloL (TENORMIN) 100 MG tablet Take 100 mg by mouth once daily  atorvastatin (LIPITOR) 80 MG tablet Take 80 mg by mouth at bedtime  benzonatate (TESSALON) 200 MG capsule Take 200 mg by mouth 3 (three) times  daily as needed for Cough  blood glucose diagnostic (GLUCOSE BLOOD) test strip 1 strip 2 (two) times daily To be used twice daily for testing blood sugars  budesonide-glycopyrrolate-formoterol (BREZTRI AEROSPHERE) 160-9-4.8 mcg/actuation inhaler  Inhale 2 Inhalations into the lungs 2 (two) times daily  cholecalciferol (VITAMIN D3) 1000 unit tablet Take 25 mcg by mouth once daily In the afternoon  cinnamon bark 500 mg capsule Take 500 mg by mouth once daily  cyanocobalamin (VITAMIN B12) 1000 MCG tablet Take 1,000 mcg by mouth once daily  empagliflozin (JARDIANCE) 10 mg tablet Take 10 mg by mouth every morning before breakfast  EPINEPHrine (EPIPEN) 0.3 mg/0.3 mL auto-injector Inject 0.3 mg into the muscle once as needed for Anaphylaxis  fluticasone furoate-vilanteroL 200-25 mcg/dose DsDv Inhale 1 Puff into the lungs once daily  fluticasone propionate (FLONASE) 50 mcg/actuation nasal spray Place 1 spray into one nostril once daily  ipratropium (ATROVENT) 21 mcg (0.03 %) nasal spray Place 2 sprays into both nostrils 2 (two) times daily  levothyroxine (SYNTHROID) 125 MCG tablet Take 125 mcg by mouth once daily Take 1 tablet (125 mcg total) by mouth daily before breakfas  nitroGLYcerin (NITROSTAT) 0.4 MG SL tablet Place 0.4 mg under the tongue every 5 (five) minutes as needed  [name] 100 mg/mL auto-injection Inject 100 mg subcutaneously every 28 (twenty-eight) days  omega-3 fatty acids/fish oil (FISH OIL) 340-1,000 mg capsule Take 1,000 mg by mouth once daily  SYMBICORT 80-4.5 mcg/actuation inhaler Inhale 2 inhalations into the lungs 2 (two) times daily Inhale 2 puffs into the lungs in the morning and at bedtime  zinc gluconate 50 mg tablet Take 50 mg by mouth at bedtime   No current facility-administered medications on file prior to visit.   Family History  Problem Relation Age of Onset  High blood pressure (Hypertension) Mother  Hyperlipidemia (Elevated cholesterol) Mother  High blood pressure (Hypertension) Father  Coronary Artery Disease (Blocked arteries around heart) Father  Diabetes Father    Social History   Tobacco Use  Smoking Status Never  Smokeless Tobacco Never  Marital status: Divorced  Tobacco Use  Smoking status:  Never  Smokeless tobacco: Never  Vaping Use  Vaping status: Never Used  Substance and Sexual Activity  Alcohol use: Never  Drug use: Never    Objective:   Physical Exam Vitals reviewed.  Constitutional:  Appearance: Normal appearance.  Chest:  Breasts: Right: No inverted nipple, mass or nipple discharge.  Left: No inverted nipple, mass or nipple discharge.  Lymphadenopathy:  Upper Body:  Right upper body: No supraclavicular or axillary adenopathy.  Left upper body: No supraclavicular or axillary adenopathy.  Neurological:  Mental Status: She is alert.    Assessment and Plan:   Right breast cancer, clinical stage I Right breast seed guided lumpectomy, possible staged reduction  We discussed the staging and pathophysiology of breast cancer. We discussed all of the different options for treatment for breast cancer including surgery, chemotherapy, radiation therapy, and antiestrogen therapy.  We discussed a sentinel lymph node biopsy which I do not think she needs based on data. We discussed this.  We discussed the options for treatment of the breast cancer which included lumpectomy versus a mastectomy. We discussed the performance of the lumpectomy with radioactive seed placement. We discussed a 5-10% chance of a positive margin requiring reexcision in the operating room. We also discussed that she will likely need radiation therapy if she undergoes lumpectomy. We discussed mastectomy and the postoperative care for that as well. Mastectomy can  be followed by reconstruction. The decision for lumpectomy vs mastectomy has no impact on decision for chemotherapy. Most mastectomy patients will not need radiation therapy. We discussed that there is no difference in her survival whether she undergoes lumpectomy with radiation therapy or antiestrogen therapy versus a mastectomy. There is also no real difference between her recurrence in the breast.  We discussed the risks of operation  including bleeding, infection, possible reoperation. She understands her further therapy will be based on what her stages at the time of her operation.

## 2024-01-01 NOTE — Interval H&P Note (Signed)
History and Physical Interval Note:  01/01/2024 10:38 AM  Tara Campbell  has presented today for surgery, with the diagnosis of RIGHT BREAST CANCER.  The various methods of treatment have been discussed with the patient and family. After consideration of risks, benefits and other options for treatment, the patient has consented to  Procedure(s): RIGHT BREAST LUMPECTOMY WITH RADIOACTIVE SEED LOCALIZATION (Right) as a surgical intervention.  The patient's history has been reviewed, patient examined, no change in status, stable for surgery.  I have reviewed the patient's chart and labs.  Questions were answered to the patient's satisfaction.     [name]

## 2024-01-01 NOTE — Transfer of Care (Signed)
Immediate Anesthesia Transfer of Care Note  Patient: Tara Campbell  Procedure(s) Performed: RIGHT BREAST LUMPECTOMY WITH RADIOACTIVE SEED LOCALIZATION (Right: Breast)  Patient Location: PACU  Anesthesia Type:General  Level of Consciousness: awake, alert , and oriented  Airway & Oxygen Therapy: Patient Spontanous Breathing  Post-op Assessment: Report given to RN and Post -op Vital signs reviewed and stable  Post vital signs: Reviewed and stable  Last Vitals:  Vitals Value Taken Time  BP 113/51 01/01/24 1207  Temp 36.8 C 01/01/24 1207  Pulse 71 01/01/24 1209  Resp 14 01/01/24 1209  SpO2 98 % 01/01/24 1209  Vitals shown include unfiled device data.  Last Pain:  Vitals:   01/01/24 1207  TempSrc:   PainSc: 0-No pain         Complications: No notable events documented.

## 2024-01-01 NOTE — Discharge Instructions (Signed)
[facility] Office Phone Number [phone]  POST OP INSTRUCTIONS Take 400 mg of ibuprofen every 8 hours or 650 mg tylenol every 6 hours for next 72 hours then as needed. Use ice several times daily also.  A prescription for pain medication may be given to you upon discharge.  Take your pain medication as prescribed, if needed.  If narcotic pain medicine is not needed, then you may take acetaminophen (Tylenol), naprosyn (Alleve) or ibuprofen (Advil) as needed. Take your usually prescribed medications unless otherwise directed If you need a refill on your pain medication, please contact your pharmacy.  They will contact our office to request authorization.  Prescriptions will not be filled after 5pm or on week-ends. You should eat very light the first 24 hours after surgery, such as soup, crackers, pudding, etc.  Resume your normal diet the day after surgery. Most patients will experience some swelling and bruising in the breast.  Ice packs and a good support bra will help.  Wear the breast binder provided or a sports bra for 72 hours day and night.  After that wear a sports bra during the day until you return to the office. Swelling and bruising can take several days to resolve.  It is common to experience some constipation if taking pain medication after surgery.  Increasing fluid intake and taking a stool softener will usually help or prevent this problem from occurring.  A mild laxative (Milk of Magnesia or Miralax) should be taken according to package directions if there are no bowel movements after 48 hours. I used skin glue on the incision, you may shower in 24 hours.  The glue will flake off over the next 2-3 weeks.  Any sutures or staples will be removed at the office during your follow-up visit. ACTIVITIES:  You may resume regular daily activities (gradually increasing) beginning the next day.  Wearing a good support bra or sports bra minimizes pain and swelling.  You may have  sexual intercourse when it is comfortable. You may drive when you no longer are taking prescription pain medication, you can comfortably wear a seatbelt, and you can safely maneuver your car and apply brakes. RETURN TO WORK:  ______________________________________________________________________________________ Tara Campbell should see your doctor in the office for a follow-up appointment approximately two weeks after your surgery.  Your doctor's nurse will typically make your follow-up appointment when she calls you with your pathology report.  Expect your pathology report 3-4 business days after your surgery.  You may call to check if you do not hear from [place] after three days. OTHER INSTRUCTIONS: _______________________________________________________________________________________________ _____________________________________________________________________________________________________________________________________ _____________________________________________________________________________________________________________________________________ _____________________________________________________________________________________________________________________________________  WHEN TO CALL Tara Campbell: Fever over 101.0 Nausea and/or vomiting. Extreme swelling or bruising. Continued bleeding from incision. Increased pain, redness, or drainage from the incision.  The clinic staff is available to answer your questions during regular business hours.  Please don't hesitate to call and ask to speak to one of the nurses for clinical concerns.  If you have a medical emergency, go to the nearest emergency room or call 911.  A surgeon from [facility] is always on call at the hospital.  For further questions, please visit [email or web address]

## 2024-01-01 NOTE — Anesthesia Procedure Notes (Signed)
Procedure Name: LMA Insertion Date/Time: 01/01/2024 11:11 AM  Performed by: Caryn Bee A, CRNAPre-anesthesia Checklist: Patient identified, Emergency Drugs available, Suction available and Patient being monitored Patient Re-evaluated:Patient Re-evaluated prior to induction Oxygen Delivery Method: Circle System Utilized Preoxygenation: Pre-oxygenation with 100% oxygen Induction Type: IV induction Ventilation: Mask ventilation without difficulty LMA: LMA inserted LMA Size: 4.0 Number of attempts: 1 Airway Equipment and Method: Bite block Placement Confirmation: positive ETCO2 Tube secured with: Tape Dental Injury: Teeth and Oropharynx as per pre-operative assessment

## 2024-01-01 NOTE — Op Note (Signed)
Preoperative diagnosis: clinical stage I right breast cancer Postoperative diagnosis: Same as above Procedure: Right breast seed guided lumpectomy Surgeon: Dr. Harden Mo Anesthesia: General Specimens: Right breast tissue containing seed and clip to pathology Additional inferior, lateral, medial and posterior margins marked short superior, long lateral and double deep Estimated blood loss: Minimal Complications: None Drains: Sponge no count was correct completion Disposition to recovery stable condition    Indications: 12 yof who had screening mm that shows a right breast mass. This measures 8x7x6 mm. Ax [place] is negative. 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 She is here with her sister. She is retired.  She is interested in a reduction. She is on eliquis and has CAD with prior stents.     Procedure: After informed consent was obtained she was taken to the operating room.  She had had a seed placed prior to beginning I had these mammograms available.  She had SCDs in place.  She was given antibiotics.  She was placed under general anesthesia without complication.  She was prepped and draped in a standard sterile surgical fashion.  A surgical timeout was then performed.   The seed was located far lateral right breast.   I infiltrated Marcaine.  I then made a curivlinear incision overlying the seed and used the neoprobe to dissected the seed.  I removed the seed and some of the surrounding tissue.  Mammogram confirmed removal of the seed and the clip. I removed several margins as well after obtaining 3D images.  I then obtained hemostasis.  I closed this with 2-0 Vicryl.  The skin was closed with 3-0 Vicryl and 4-0 Monocryl.  Glue and Steri-Strips were applied.  She tolerated this well and was transferred recovery stable.

## 2024-01-02 ENCOUNTER — Encounter (HOSPITAL_COMMUNITY): Payer: Self-pay | Admitting: General Surgery

## 2024-01-02 NOTE — Anesthesia Postprocedure Evaluation (Signed)
Anesthesia Post Note  Patient: Tara Campbell  Procedure(s) Performed: RIGHT BREAST LUMPECTOMY WITH RADIOACTIVE SEED LOCALIZATION (Right: Breast)     Patient location during evaluation: PACU Anesthesia Type: General Level of consciousness: awake and alert Pain management: pain level controlled Vital Signs Assessment: post-procedure vital signs reviewed and stable Respiratory status: spontaneous breathing, nonlabored ventilation and respiratory function stable Cardiovascular status: blood pressure returned to baseline and stable Postop Assessment: no apparent nausea or vomiting Anesthetic complications: no   No notable events documented.  Last Vitals:  Vitals:   01/01/24 1230 01/01/24 1237  BP: (!) 106/57 (!) 113/96  Pulse: 70 70  Resp: 14 14  Temp:  36.8 C  SpO2: 98% 97%    Last Pain:  Vitals:   01/01/24 1230  TempSrc:   PainSc: 0-No pain                 Lowella Curb

## 2024-01-06 ENCOUNTER — Encounter: Payer: Self-pay | Admitting: *Deleted

## 2024-01-06 LAB — SURGICAL PATHOLOGY

## 2024-01-09 ENCOUNTER — Other Ambulatory Visit: Payer: Self-pay | Admitting: Family Medicine

## 2024-01-09 DIAGNOSIS — I2510 Atherosclerotic heart disease of native coronary artery without angina pectoris: Secondary | ICD-10-CM

## 2024-01-09 NOTE — Telephone Encounter (Signed)
 Please schedule to be seen by APP at next available slot. Thanks

## 2024-01-10 ENCOUNTER — Telehealth: Payer: Self-pay

## 2024-01-10 NOTE — Telephone Encounter (Signed)
 Called patient to schedule office visit with Dr. Theophilus.  Patient states she had her breast cancer surgery on 01/01/2024 and did excellent.  She did very well on the prednisone  and does not feel she needs to come in at this time.  I did schedule her for her 6 month follow up visit on 03/25/2024 with Dr. Theophilus.

## 2024-01-14 ENCOUNTER — Inpatient Hospital Stay: Payer: Medicare Other | Attending: Hematology and Oncology | Admitting: Hematology and Oncology

## 2024-01-14 VITALS — BP 141/59 | HR 92 | Temp 98.3°F | Resp 20 | Ht 64.0 in | Wt 185.1 lb

## 2024-01-14 DIAGNOSIS — C50411 Malignant neoplasm of upper-outer quadrant of right female breast: Secondary | ICD-10-CM | POA: Diagnosis not present

## 2024-01-14 DIAGNOSIS — Z79899 Other long term (current) drug therapy: Secondary | ICD-10-CM | POA: Diagnosis not present

## 2024-01-14 DIAGNOSIS — Z7901 Long term (current) use of anticoagulants: Secondary | ICD-10-CM | POA: Diagnosis not present

## 2024-01-14 DIAGNOSIS — Z882 Allergy status to sulfonamides status: Secondary | ICD-10-CM | POA: Diagnosis not present

## 2024-01-14 DIAGNOSIS — N6310 Unspecified lump in the right breast, unspecified quadrant: Secondary | ICD-10-CM | POA: Diagnosis not present

## 2024-01-14 DIAGNOSIS — Z1721 Progesterone receptor positive status: Secondary | ICD-10-CM | POA: Insufficient documentation

## 2024-01-14 DIAGNOSIS — Z170 Estrogen receptor positive status [ER+]: Secondary | ICD-10-CM | POA: Insufficient documentation

## 2024-01-14 NOTE — Progress Notes (Signed)
Patient Care Team: Agapito Games, MD as PCP - General (Family Medicine) Laurey Morale, MD as PCP - Advanced Heart Failure (Cardiology) Lupita Leash, MD as Consulting Physician (Pulmonary Disease) Jens Som Madolyn Frieze, MD as Consulting Physician (Cardiology) Gabriel Carina, [facility] (Inactive) as Pharmacist (Pharmacist) Glendale Chard, DO as Consulting Physician (Neurology) [facility], Dory Peru, MD as Rounding Team (Cardiology) Donnelly Angelica, RN as Oncology Nurse Navigator Pershing Proud, RN as Oncology Nurse Navigator [name], MD as Consulting Physician (General Surgery) Serena Croissant, MD as Consulting Physician (Hematology and Oncology) Dorothy Puffer, MD as Consulting Physician (Radiation Oncology)  DIAGNOSIS:  Encounter Diagnosis  Name Primary?   Malignant neoplasm of upper-outer quadrant of right breast in female, estrogen receptor positive (HCC) Yes    SUMMARY OF ONCOLOGIC HISTORY: Oncology History  Malignant neoplasm of upper-outer quadrant of right breast in female, estrogen receptor positive (HCC)  12/03/2023 Initial Diagnosis   Screening mammogram detected right breast mass at 10 o'clock position measuring 0.8 cm, axilla negative, ultrasound-guided biopsy revealed grade 2 IDC with DCIS ER 95%, PR 20%, HER2 negative, Ki-67 10%   12/11/2023 Cancer Staging   Staging form: Breast, AJCC 8th Edition - Clinical stage from 12/11/2023: Stage IA (cT1b, cN0, cM0, G2, ER+, PR+, HER2-) - Signed by Ronny Bacon, PA-C on 12/11/2023 Stage prefix: Initial diagnosis Method of lymph node assessment: Clinical Histologic grading system: 3 grade system   12/20/2023 Genetic Testing   Negative [facility] CancerNext-Expanded +RNAinsight Panel.  Report date is 12/20/2023.   The CancerNext-Expanded gene panel offered by [facility] and includes sequencing, rearrangement, and RNA analysis for the following 76 genes: AIP, ALK, APC, ATM, AXIN2, BAP1, BARD1, BMPR1A, BRCA1,  BRCA2, BRIP1, CDC73, CDH1, CDK4, CDKN1B, CDKN2A, CEBPA, CHEK2, CTNNA1, DDX41, DICER1, ETV6, FH, FLCN, GATA2, LZTR1, MAX, MBD4, MEN1, MET, MLH1, MSH2, MSH3, MSH6, MUTYH, NF1, NF2, NTHL1, PALB2, PHOX2B, PMS2, POT1, PRKAR1A, PTCH1, PTEN, RAD51C, RAD51D, RB1, RET, RUNX1, SDHA, SDHAF2, SDHB, SDHC, SDHD, SMAD4, SMARCA4, SMARCB1, SMARCE1, STK11, SUFU, TMEM127, TP53, TSC1, TSC2, VHL, and WT1 (sequencing and deletion/duplication); EGFR, HOXB13, KIT, MITF, PDGFRA, POLD1, and POLE (sequencing only); EPCAM and GREM1 (deletion/duplication only).     01/01/2024 Surgery   Right lumpectomy: Grade 2 IDC 1 cm, intermediate grade DCIS, margins negative, LVI not identified, ER 95%, PR 20%, HER2 1+ negative, Ki67 10%     CHIEF COMPLIANT: Follow-up after surgery to discuss pathology report  HISTORY OF PRESENT ILLNESS: Tara Campbell is a 78 year old with above-mentioned history of early-stage breast cancer who underwent lumpectomy and is here today to discuss the pathology report.  Tara Campbell is healing and recovering very well from the surgery.  Tara Campbell is here accompanied by her friend.  ALLERGIES:  is allergic to sulfamethoxazole, sertraline, sulfa antibiotics, and tape.  MEDICATIONS:  Current Outpatient Medications  Medication Sig Dispense Refill   albuterol (VENTOLIN HFA) 108 (90 Base) MCG/ACT inhaler Inhale 2 puffs into the lungs every 6 (six) hours as needed for wheezing. 18 g 5   allopurinol (ZYLOPRIM) 300 MG tablet Take 300 mg by mouth 2 (two) times daily.     apixaban (ELIQUIS) 5 MG TABS tablet Take 1 tablet (5 mg total) by mouth 2 (two) times daily. 180 tablet 3   atenolol (TENORMIN) 100 MG tablet Take 1 tablet (100 mg total) by mouth daily. 90 tablet 1   atorvastatin (LIPITOR) 80 MG tablet Take 1 tablet (80 mg total) by mouth at bedtime. 90 tablet 3   Budeson-Glycopyrrol-Formoterol (BREZTRI AEROSPHERE) 160-9-4.8 MCG/ACT AERO  Inhale 2 puffs into the lungs in the morning and at bedtime. 1 each 3   clotrimazole-betamethasone  (LOTRISONE) cream Apply 1 Application topically 2 (two) times daily. 2 weeks 30 g 0   cyanocobalamin (VITAMIN B12) 1000 MCG tablet Take 1,000 mcg by mouth daily.     empagliflozin (JARDIANCE) 10 MG TABS tablet Take 1 tablet (10 mg total) by mouth daily before breakfast. 100 tablet 4   EPINEPHrine 0.3 mg/0.3 mL IJ SOAJ injection Inject 0.3 mg into the muscle as needed for anaphylaxis. Call 9-1-1 after use. 1 each 2   Evolocumab (REPATHA SURECLICK) 140 MG/ML SOAJ Inject 140 mg into the skin every 14 (fourteen) days. 6 mL 1   ezetimibe (ZETIA) 10 MG tablet Take 1 tablet (10 mg total) by mouth daily. 90 tablet 3   fluticasone furoate-vilanterol (BREO ELLIPTA) 200-25 MCG/ACT AEPB INHALE 1 PUFF INTO THE lungs DAILY 60 each 5   glucose blood test strip To be used twice daily for testing blood sugars. E11.65 200 each 11   ipratropium (ATROVENT) 0.03 % nasal spray Place 2 sprays into both nostrils every 12 (twelve) hours. (Patient taking differently: Place 2 sprays into both nostrils every 12 (twelve) hours as needed for rhinitis.) 30 mL 0   ipratropium-albuterol (DUONEB) 0.5-2.5 (3) MG/3ML SOLN Take 3 mLs by nebulization every 6 (six) hours as needed. 3 mL PRN   irbesartan (AVAPRO) 300 MG tablet Take 150 mg by mouth every evening.     isosorbide mononitrate (IMDUR) 120 MG 24 hr tablet Take 60-120 mg by mouth See admin instructions. Take 60 mg in the morning and 120 mg in the evening     levothyroxine (SYNTHROID) 125 MCG tablet Take 1 tablet (125 mcg total) by mouth daily before breakfast. 90 tablet 0   Mepolizumab ([name]) 100 MG/ML SOAJ Inject 1 mL (100 mg total) into the skin every 28 (twenty-eight) days. 1 mL 4   metFORMIN (GLUCOPHAGE) 500 MG tablet Take 2 tablets (1,000 mg total) by mouth 2 (two) times daily with a meal. 180 tablet 3   nitroGLYCERIN (NITROSTAT) 0.4 MG SL tablet Place 1 tablet (0.4 mg total) under the tongue every 5 (five) minutes as needed for chest pain (or tightness). 25 tablet PRN    Omega-3 Fatty Acids (FISH OIL) 1000 MG CAPS Take 1 capsule (1,000 mg total) by mouth daily. 90 capsule 3   ranolazine (RANEXA) 500 MG 12 hr tablet Take 1 tablet (500 mg total) by mouth 2 (two) times daily. 60 tablet 5   tirzepatide (MOUNJARO) 2.5 MG/0.5ML Pen Inject 2.5 mg into the skin once a week. 2 mL 0   Vitamin D, Cholecalciferol, 25 MCG (1000 UT) TABS Take 25 mcg by mouth daily in the afternoon. 60 tablet    zinc gluconate 50 MG tablet Take 50 mg by mouth at bedtime.     No current facility-administered medications for this visit.    PHYSICAL EXAMINATION: ECOG PERFORMANCE STATUS: 1 - Symptomatic but completely ambulatory  Vitals:   01/14/24 0933  BP: (!) 141/59  Pulse: 92  Resp: 20  Temp: 98.3 F (36.8 C)  SpO2: 98%   Filed Weights   01/14/24 0933  Weight: 185 lb 1.6 oz (84 kg)      LABORATORY DATA:  I have reviewed the data as listed    Latest Ref Rng & Units 12/11/2023   12:22 PM 08/06/2023   10:02 AM 06/04/2023   11:30 AM  CMP  Glucose 70 - 99 mg/dL 161  126  118   BUN 8 - 23 mg/dL 20  13  17    Creatinine 0.44 - 1.00 mg/dL 1.61  0.96  0.45   Sodium 135 - 145 mmol/L 137  139  139   Potassium 3.5 - 5.1 mmol/L 4.0  4.1  4.5   Chloride 98 - 111 mmol/L 104  103  104   CO2 22 - 32 mmol/L 24  26  26    Calcium 8.9 - 10.3 mg/dL 9.5  40.9  81.1   Total Protein 6.5 - 8.1 g/dL 6.3   6.6   Total Bilirubin 0.0 - 1.2 mg/dL 0.4   0.4   Alkaline Phos 38 - 126 U/L 81     AST 15 - 41 U/L 11   13   ALT 0 - 44 U/L 15   12     Lab Results  Component Value Date   WBC 16.3 (H) 12/11/2023   HGB 12.5 12/11/2023   HCT 36.8 12/11/2023   MCV 87.4 12/11/2023   PLT 371 12/11/2023   NEUTROABS 14.7 (H) 12/11/2023    ASSESSMENT & PLAN:  Malignant neoplasm of upper-outer quadrant of right breast in female, estrogen receptor positive (HCC) 12/03/2023:Screening mammogram detected right breast mass at 10 o'clock position measuring 0.8 cm, axilla negative, ultrasound-guided biopsy revealed  grade 2 IDC with DCIS ER 95%, PR 20%, HER2 negative, Ki-67 10%   01/01/2024: Right lumpectomy: Grade 2 IDC 1 cm, intermediate grade DCIS, margins negative, LVI not identified, ER 95%, PR 20%, HER2 1+ negative, Ki67 10%  Pathology counseling: I discussed the final pathology report of the patient provided  a copy of this report. I discussed the margins as well as lymph node surgeries. We also discussed the final staging along with previously performed ER/PR and HER-2/neu testing.  Treatment plan: radiation Adjuvant antiestrogen therapy  Return to negative end of radiation to start antiestrogen therapy.  No orders of the defined types were placed in this encounter.  The patient has a good understanding of the overall plan. Tara Campbell agrees with it. Tara Campbell will call with any problems that may develop before the next visit here. Total time spent: 30 mins including face to face time and time spent for planning, charting and co-ordination of care   Tamsen Meek, MD 01/14/24

## 2024-01-14 NOTE — Assessment & Plan Note (Addendum)
12/03/2023:Screening mammogram detected right breast mass at 10 o'clock position measuring 0.8 cm, axilla negative, ultrasound-guided biopsy revealed grade 2 IDC with DCIS ER 95%, PR 20%, HER2 negative, Ki-67 10%   01/01/2024: Right lumpectomy: Grade 2 IDC 1 cm, intermediate grade DCIS, margins negative, LVI not identified, ER 95%, PR 20%, HER2 1+ negative, Ki67 10%  Pathology counseling: I discussed the final pathology report of the patient provided  a copy of this report. I discussed the margins as well as lymph node surgeries. We also discussed the final staging along with previously performed ER/PR and HER-2/neu testing.  Treatment plan: +/- radiation Adjuvant antiestrogen therapy  Return to clinic based upon decision regarding radiation.

## 2024-01-27 NOTE — Progress Notes (Signed)
 Radiation Oncology         [phone] ________________________________  Name: Tara Campbell        MRN: [id number]  Date of Service: 01/28/2024 DOB: 1946-06-16  WG:[id number], Barbarann Ehlers, MD  Serena Croissant, MD     REFERRING PHYSICIAN: Serena Croissant, MD   DIAGNOSIS: The encounter diagnosis was Malignant neoplasm of upper-outer quadrant of right breast in female, estrogen receptor positive (HCC).   HISTORY OF PRESENT ILLNESS: Tara Campbell is a 78 y.o. female originally seen in the multidisciplinary breast clinic for a new diagnosis of right breast cancer. The patient was noted to have screening detected mass. Diagnostic work up on 12/02/23 noted the mass in the 10:00 measuring 8 mm by ultrasound the axilla wa snegative for adenopathy. 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 The cancer was ER/PR positive, HER2 negative with a Ki67 of 10%.    Since her last visit, the patient underwent a right lumpectomy with Dr. Dwain Sarna on 01/01/2024.  Final pathology showed grade 2 invasive ductal carcinoma measuring 1 cm, with associated intermediate grade DCIS.  Her margins were negative for invasive disease 1 mm from the anterior margin, and 1 mm to the anterior margin also for DCIS.  Additional inferior and medial margin and posterior margin were all negative for disease. She did decide to forgo breast reduction due to risks associated with her current medications. She is seen to discuss adjuvant radiation. Dr. Pamelia Hoit has offered antiestrogen therapy as well.    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   Otomycosis of right ear 09/27/2017   Paroxysmal atrial fibrillation (HCC)    Pneumonia    11/2023   Thyroid disease    Trochanteric bursitis of right hip 05/29/2017       PAST SURGICAL HISTORY: Past Surgical History:  Procedure Laterality Date   ANGIOPLASTY     BREAST BIOPSY Right 12/03/2023   [place] RT BREAST BX W LOC DEV 1ST LESION IMG BX SPEC US GUIDE 12/03/2023 [facility] MAMMOGRAPHY   BREAST BIOPSY  12/31/2023   MM RT RADIOACTIVE SEED LOC MAMMO GUIDE 12/31/2023 [facility] MAMMOGRAPHY   BREAST LUMPECTOMY WITH RADIOACTIVE SEED LOCALIZATION Right 01/01/2024   Procedure: RIGHT BREAST LUMPECTOMY WITH RADIOACTIVE SEED LOCALIZATION;  Surgeon: [name], MD;  Location: [facility] OR;  Service: General;  Laterality: Right;   CARDIAC CATHETERIZATION     CATARACT EXTRACTION Bilateral 2010   COLONOSCOPY W/ POLYPECTOMY  01/04/2015   CORONARY STENT INTERVENTION N/A 08/31/2022   Procedure: CORONARY STENT INTERVENTION;  Surgeon: Tonny Bollman, MD;  Location: [facility] INVASIVE CV LAB;  Service: Cardiovascular;  Laterality: N/A;   HEMORRHOID BANDING  2015   LEFT HEART CATH AND CORONARY ANGIOGRAPHY N/A 04/26/2021   Procedure: LEFT HEART CATH AND CORONARY ANGIOGRAPHY;  Surgeon: Tonny Bollman, MD;  Location: [facility] INVASIVE CV LAB;  Service: Cardiovascular;  Laterality: N/A;   LEFT HEART CATH AND CORONARY ANGIOGRAPHY  N/A 09/14/2022   Procedure: LEFT HEART CATH AND CORONARY ANGIOGRAPHY;  Surgeon: Tonny Bollman, MD;  Location: [facility] INVASIVE CV LAB;  Service: Cardiovascular;  Laterality: N/A;     FAMILY HISTORY:  Family History  Problem Relation Age of Onset   Hypertension Mother    Hyperlipidemia Mother    Alzheimer's disease Mother    Vascular Disease Mother    Glaucoma Father    Heart disease Father    Hypertension Father    Diabetes Father    Hyperlipidemia Father    Skin cancer Father        SCC; dx 18s-80s   Diabetes Paternal  Grandmother    Colon cancer Neg Hx    Esophageal cancer Neg Hx      SOCIAL HISTORY:  reports that she has never smoked. She has never used smokeless tobacco. She reports that she does not currently use alcohol. She reports that she does not use drugs. The patient is divorced and lives in [place]. She retired to [place] from [place], and had worked in a school system prior to retiring. She's accompanied by her sister Liborio Nixon.  They enjoy spending time together and going shopping. She also enjoys painting.    ALLERGIES: Sulfamethoxazole, Sertraline, Sulfa antibiotics, and Tape   MEDICATIONS:  Current Outpatient Medications  Medication Sig Dispense Refill   albuterol (VENTOLIN HFA) 108 (90 Base) MCG/ACT inhaler Inhale 2 puffs into the lungs every 6 (six) hours as needed for wheezing. 18 g 5   allopurinol (ZYLOPRIM) 300 MG tablet Take 300 mg by mouth 2 (two) times daily.     apixaban (ELIQUIS) 5 MG TABS tablet Take 1 tablet (5 mg total) by mouth 2 (two) times daily. 180 tablet 3   atenolol (TENORMIN) 100 MG tablet Take 1 tablet (100 mg total) by mouth daily. 90 tablet 1   atorvastatin (LIPITOR) 80 MG tablet Take 1 tablet (80 mg total) by mouth at bedtime. 90 tablet 3   Budeson-Glycopyrrol-Formoterol (BREZTRI AEROSPHERE) 160-9-4.8 MCG/ACT AERO Inhale 2 puffs into the lungs in the morning and at bedtime. 1 each 3   clotrimazole-betamethasone (LOTRISONE) cream Apply 1 Application topically 2 (two) times daily. 2 weeks 30 g 0   cyanocobalamin (VITAMIN B12) 1000 MCG tablet Take 1,000 mcg by mouth daily.     empagliflozin (JARDIANCE) 10 MG TABS tablet Take 1 tablet (10 mg total) by mouth daily before breakfast. 100 tablet 4   EPINEPHrine 0.3 mg/0.3 mL IJ SOAJ injection Inject 0.3 mg into the muscle as needed for anaphylaxis. Call 9-1-1 after use. 1 each 2   Evolocumab (REPATHA SURECLICK) 140 MG/ML SOAJ Inject 140 mg into the skin every 14 (fourteen) days. 6 mL 1   ezetimibe (ZETIA) 10 MG tablet Take 1  tablet (10 mg total) by mouth daily. 90 tablet 3   fluticasone furoate-vilanterol (BREO ELLIPTA) 200-25 MCG/ACT AEPB INHALE 1 PUFF INTO THE lungs DAILY 60 each 5   glucose blood test strip To be used twice daily for testing blood sugars. E11.65 200 each 11   ipratropium (ATROVENT) 0.03 % nasal spray Place 2 sprays into both nostrils every 12 (twelve) hours. (Patient taking differently: Place 2 sprays into both nostrils every 12 (twelve) hours as needed for rhinitis.) 30 mL 0   ipratropium-albuterol (DUONEB) 0.5-2.5 (3) MG/3ML SOLN Take 3 mLs by nebulization every 6 (six) hours as needed. 3 mL PRN   irbesartan (AVAPRO) 300 MG tablet Take 150 mg by mouth every evening.     isosorbide mononitrate (IMDUR)  120 MG 24 hr tablet Take 60-120 mg by mouth See admin instructions. Take 60 mg in the morning and 120 mg in the evening     levothyroxine (SYNTHROID) 125 MCG tablet Take 1 tablet (125 mcg total) by mouth daily before breakfast. 90 tablet 0   Mepolizumab ([name]) 100 MG/ML SOAJ Inject 1 mL (100 mg total) into the skin every 28 (twenty-eight) days. 1 mL 4   metFORMIN (GLUCOPHAGE) 500 MG tablet Take 2 tablets (1,000 mg total) by mouth 2 (two) times daily with a meal. 180 tablet 3   nitroGLYCERIN (NITROSTAT) 0.4 MG SL tablet Place 1 tablet (0.4 mg total) under the tongue every 5 (five) minutes as needed for chest pain (or tightness). 25 tablet PRN   Omega-3 Fatty Acids (FISH OIL) 1000 MG CAPS Take 1 capsule (1,000 mg total) by mouth daily. 90 capsule 3   ranolazine (RANEXA) 500 MG 12 hr tablet Take 1 tablet (500 mg total) by mouth 2 (two) times daily. 60 tablet 5   tirzepatide (MOUNJARO) 2.5 MG/0.5ML Pen Inject 2.5 mg into the skin once a week. 2 mL 0   Vitamin D, Cholecalciferol, 25 MCG (1000 UT) TABS Take 25 mcg by mouth daily in the afternoon. 60 tablet    zinc gluconate 50 MG tablet Take 50 mg by mouth at bedtime.     No current facility-administered medications for this visit.     REVIEW OF  SYSTEMS: On review of systems, the patient reports that she is doing well overall. She feels some soreness related to surgery. She had some fluid in her surgical site which has improved in the last few weeks. No other complaints are noted.       PHYSICAL EXAM:  Wt Readings from Last 3 Encounters:  01/14/24 185 lb 1.6 oz (84 kg)  01/01/24 190 lb (86.2 kg)  12/27/23 189 lb 9.6 oz (86 kg)   Temp Readings from Last 3 Encounters:  01/14/24 98.3 F (36.8 C) (Temporal)  01/01/24 98.2 F (36.8 C)  12/27/23 97.7 F (36.5 C)   BP Readings from Last 3 Encounters:  01/14/24 (!) 141/59  01/01/24 (!) 113/96  12/27/23 (!) 172/80   Pulse Readings from Last 3 Encounters:  01/14/24 92  01/01/24 70  12/27/23 97    In general this is a well appearing caucasian female in no acute distress. She's alert and oriented x4 and appropriate throughout the examination. Cardiopulmonary assessment is negative for acute distress and she exhibits normal effort. Her right breast shows a well healed surgical incision without erythema, separation, or drainage is noted.    ECOG = 1  0 - Asymptomatic (Fully active, able to carry on all predisease activities without restriction)  1 - Symptomatic but completely ambulatory (Restricted in physically strenuous activity but ambulatory and able to carry out work of a light or sedentary nature. For example, light housework, office work)  2 - Symptomatic, <50% in bed during the day (Ambulatory and capable of all self care but unable to carry out any work activities. Up and about more than 50% of waking hours)  3 - Symptomatic, >50% in bed, but not bedbound (Capable of only limited self-care, confined to bed or chair 50% or more of waking hours)  4 - Bedbound (Completely disabled. Cannot carry on any self-care. Totally confined to bed or chair)  5 - Death   Santiago Glad MM, Creech RH, Tormey DC, et al. [phone]). "Toxicity and response criteria of the [facility]". Am.  J. Clin. Oncol. 5 (6): 649-55    LABORATORY DATA:  Lab Results  Component Value Date   WBC 16.3 (H) 12/11/2023   HGB 12.5 12/11/2023   HCT 36.8 12/11/2023   MCV 87.4 12/11/2023   PLT 371 12/11/2023   Lab Results  Component Value Date   NA 137 12/11/2023   K 4.0 12/11/2023   CL 104 12/11/2023   CO2 24 12/11/2023   Lab Results  Component Value Date   ALT 15 12/11/2023   AST 11 (L) 12/11/2023   ALKPHOS 81 12/11/2023   BILITOT 0.4 12/11/2023      RADIOGRAPHY: MM Breast Surgical Specimen Result Date: 01/01/2024 CLINICAL DATA:  Evaluate surgical specimen following lumpectomy for RIGHT breast cancer. EXAM: SPECIMEN RADIOGRAPH OF THE RIGHT BREAST COMPARISON:  Previous exam(s). FINDINGS: Status post excision of the RIGHT breast. The radioactive seed and RIBBON biopsy clip are present and intact IMPRESSION: Specimen radiograph of the RIGHT breast. Electronically Signed   By: Harmon Pier M.D.   On: 01/01/2024 15:52   MM RT RADIOACTIVE SEED LOC MAMMO GUIDE Result Date: 12/31/2023 CLINICAL DATA:  Right breast needle localization prior to surgery. Biopsy proven invasive mammary carcinoma in the right breast. EXAM: MAMMOGRAPHIC GUIDED RADIOACTIVE SEED LOCALIZATION OF THE RIGHT BREAST COMPARISON:  Previous exam(s). FINDINGS: Patient presents for radioactive seed localization prior to surgery. I met with the patient and we discussed the procedure of seed localization including benefits and alternatives. We discussed the high likelihood of a successful procedure. We discussed the risks of the procedure including infection, bleeding, tissue injury and further surgery. We discussed the low dose of radioactivity involved in the procedure. Informed, written consent was given. The usual time-out protocol was performed immediately prior to the procedure. Using mammographic guidance, sterile technique, 1% lidocaine and an I-125 radioactive seed, the ribbon shaped clip was localized using  a lateral to medial approach. The follow-up mammogram images confirm the seed in the expected location and were marked for Dr. Dwain Sarna. Follow-up survey of the patient confirms presence of the radioactive seed. Order number of I-125 seed:  [id number]. Total activity:  0.240 millicuries reference Date: 11/13/2023 The patient tolerated the procedure well and was [facility]. She was given instructions regarding seed removal. IMPRESSION: Radioactive seed localization right breast. No apparent complications. Electronically Signed   By: Baird Lyons M.D.   On: 12/31/2023 11:12       IMPRESSION/PLAN: 1. Stage IA, pT1bN0M0, grade 2 ER/PR positive invasive ductal carinoma of the right breast. Dr. Mitzi Hansen has reviewed her final pathology findings and today she and I reviewed the nature of right breast disease. Dr. Pamelia Hoit plans to offer antiestrogen therapy. We discussed the role of external beam radiation to the right breast to reduce risks of local recurrence, and also discussed that in situations like hers, she meets criteria to elect for radiation or to forgo this. We also discussed options of therapy would include 4 weeks of radiotherapy or an ultrahypofractionated course once weekly for 5 fractions.  We discussed the risks, benefits, short, and long term effects of radiotherapy, as well as the curative intent. At the conclusion of therapy, she would like to proceed with radiation, and elects for the ultrahypofractionated course once weekly for 5 weeks. Written consent is obtained and placed in the chart, a copy was provided to the patient. She will simulate today.    In a visit lasting 45 minutes, greater than 50% of the time was spent face to face  reviewing her case, as well as in preparation of, discussing, and coordinating the patient's care.      Osker Mason, [facility]    **Disclaimer: This note was dictated with voice recognition software. Similar sounding words can inadvertently  be transcribed and this note may contain transcription errors which may not have been corrected upon publication of note.**

## 2024-01-28 ENCOUNTER — Ambulatory Visit
Admission: RE | Admit: 2024-01-28 | Discharge: 2024-01-28 | Disposition: A | Discharge status: Home / Self Care | Payer: Medicare Other | Source: Ambulatory Visit | Attending: Radiation Oncology | Admitting: Radiation Oncology

## 2024-01-28 ENCOUNTER — Encounter: Payer: Self-pay | Admitting: Radiation Oncology

## 2024-01-28 VITALS — BP 157/75 | HR 95 | Temp 97.8°F | Resp 20 | Ht 64.0 in | Wt 190.4 lb

## 2024-01-28 DIAGNOSIS — Z170 Estrogen receptor positive status [ER+]: Secondary | ICD-10-CM

## 2024-01-28 DIAGNOSIS — Z7985 Long-term (current) use of injectable non-insulin antidiabetic drugs: Secondary | ICD-10-CM | POA: Diagnosis not present

## 2024-01-28 DIAGNOSIS — I480 Paroxysmal atrial fibrillation: Secondary | ICD-10-CM | POA: Diagnosis not present

## 2024-01-28 DIAGNOSIS — E039 Hypothyroidism, unspecified: Secondary | ICD-10-CM | POA: Insufficient documentation

## 2024-01-28 DIAGNOSIS — Z7984 Long term (current) use of oral hypoglycemic drugs: Secondary | ICD-10-CM | POA: Insufficient documentation

## 2024-01-28 DIAGNOSIS — E785 Hyperlipidemia, unspecified: Secondary | ICD-10-CM | POA: Insufficient documentation

## 2024-01-28 DIAGNOSIS — Z79899 Other long term (current) drug therapy: Secondary | ICD-10-CM | POA: Diagnosis not present

## 2024-01-28 DIAGNOSIS — Z79890 Hormone replacement therapy (postmenopausal): Secondary | ICD-10-CM | POA: Diagnosis not present

## 2024-01-28 DIAGNOSIS — C50411 Malignant neoplasm of upper-outer quadrant of right female breast: Secondary | ICD-10-CM | POA: Insufficient documentation

## 2024-01-28 DIAGNOSIS — E119 Type 2 diabetes mellitus without complications: Secondary | ICD-10-CM | POA: Insufficient documentation

## 2024-01-28 DIAGNOSIS — Z7951 Long term (current) use of inhaled steroids: Secondary | ICD-10-CM | POA: Insufficient documentation

## 2024-01-28 DIAGNOSIS — I10 Essential (primary) hypertension: Secondary | ICD-10-CM | POA: Insufficient documentation

## 2024-01-28 DIAGNOSIS — Z7901 Long term (current) use of anticoagulants: Secondary | ICD-10-CM | POA: Diagnosis not present

## 2024-01-28 NOTE — Progress Notes (Signed)
 Nursing interview for Malignant neoplasm of upper-outer quadrant of RIGHT BREAST in female, estrogen receptor positive (HCC) Stage IA (cT1b, cN0, cM0, G2, ER+, PR+, HER2-).   Patient identity verified x2.  Patient reports healing well. Patient denies chest/arm pain, sob and any other related issues at this time.  Meaningful use complete.  Vitals- BP (!) 157/75 (BP Location: Left Arm, Patient Position: Sitting, Cuff Size: Large)   Pulse 95   Temp 97.8 F (36.6 C) (Temporal)   Resp 20   Ht 5\' 4"  (1.626 m)   Wt 190 lb 6.4 oz (86.4 kg)   SpO2 99%   BMI 32.68 kg/m   This concludes the interaction.  Ruel Favors, LPN

## 2024-01-29 ENCOUNTER — Encounter: Payer: Self-pay | Admitting: *Deleted

## 2024-01-29 ENCOUNTER — Ambulatory Visit: Payer: Medicare Other | Admitting: Radiation Oncology

## 2024-01-29 ENCOUNTER — Ambulatory Visit: Payer: Medicare Other

## 2024-01-29 DIAGNOSIS — Z170 Estrogen receptor positive status [ER+]: Secondary | ICD-10-CM

## 2024-01-31 ENCOUNTER — Telehealth: Payer: Self-pay | Admitting: Hematology and Oncology

## 2024-01-31 NOTE — Telephone Encounter (Signed)
 Tara Campbell

## 2024-02-04 ENCOUNTER — Other Ambulatory Visit: Payer: Self-pay

## 2024-02-04 ENCOUNTER — Ambulatory Visit
Admission: RE | Admit: 2024-02-04 | Discharge: 2024-02-04 | Disposition: A | Discharge status: Home / Self Care | Payer: Medicare Other | Source: Ambulatory Visit | Attending: Radiation Oncology | Admitting: Radiation Oncology

## 2024-02-04 DIAGNOSIS — Z170 Estrogen receptor positive status [ER+]: Secondary | ICD-10-CM | POA: Diagnosis not present

## 2024-02-04 DIAGNOSIS — C50411 Malignant neoplasm of upper-outer quadrant of right female breast: Secondary | ICD-10-CM | POA: Insufficient documentation

## 2024-02-04 LAB — RAD ONC ARIA SESSION SUMMARY
Course Elapsed Days: 0
Plan Fractions Treated to Date: 1
Plan Prescribed Dose Per Fraction: 5.7 Gy
Plan Total Fractions Prescribed: 5
Plan Total Prescribed Dose: 28.5 Gy
Reference Point Dosage Given to Date: 5.7 Gy
Reference Point Session Dosage Given: 5.7 Gy
Session Number: 1

## 2024-02-06 ENCOUNTER — Telehealth: Payer: Self-pay | Admitting: Adult Health

## 2024-02-06 NOTE — Telephone Encounter (Signed)
 Left patient a voicemail in regards to scheduled visit in July 2025, left patient callback number for scheduling if patient needs to cancel or reschedule

## 2024-02-10 ENCOUNTER — Ambulatory Visit: Payer: Medicare Other

## 2024-02-11 ENCOUNTER — Ambulatory Visit
Admission: RE | Admit: 2024-02-11 | Discharge: 2024-02-11 | Disposition: A | Discharge status: Home / Self Care | Payer: Medicare Other | Source: Ambulatory Visit | Attending: Radiation Oncology

## 2024-02-11 ENCOUNTER — Other Ambulatory Visit: Payer: Self-pay

## 2024-02-11 DIAGNOSIS — C50411 Malignant neoplasm of upper-outer quadrant of right female breast: Secondary | ICD-10-CM | POA: Diagnosis not present

## 2024-02-11 LAB — RAD ONC ARIA SESSION SUMMARY
Course Elapsed Days: 7
Plan Fractions Treated to Date: 2
Plan Prescribed Dose Per Fraction: 5.7 Gy
Plan Total Fractions Prescribed: 5
Plan Total Prescribed Dose: 28.5 Gy
Reference Point Dosage Given to Date: 11.4 Gy
Reference Point Session Dosage Given: 5.7 Gy
Session Number: 2

## 2024-02-17 ENCOUNTER — Ambulatory Visit: Payer: Medicare Other

## 2024-02-18 ENCOUNTER — Ambulatory Visit
Admission: RE | Admit: 2024-02-18 | Discharge: 2024-02-18 | Disposition: A | Discharge status: Home / Self Care | Payer: Medicare Other | Source: Ambulatory Visit | Attending: Radiation Oncology

## 2024-02-18 ENCOUNTER — Other Ambulatory Visit: Payer: Self-pay

## 2024-02-18 DIAGNOSIS — C50411 Malignant neoplasm of upper-outer quadrant of right female breast: Secondary | ICD-10-CM | POA: Diagnosis not present

## 2024-02-18 LAB — RAD ONC ARIA SESSION SUMMARY
Course Elapsed Days: 14
Plan Fractions Treated to Date: 3
Plan Prescribed Dose Per Fraction: 5.7 Gy
Plan Total Fractions Prescribed: 5
Plan Total Prescribed Dose: 28.5 Gy
Reference Point Dosage Given to Date: 17.1 Gy
Reference Point Session Dosage Given: 5.7 Gy
Session Number: 3

## 2024-02-20 ENCOUNTER — Other Ambulatory Visit: Payer: Self-pay | Admitting: Family Medicine

## 2024-02-20 DIAGNOSIS — E1122 Type 2 diabetes mellitus with diabetic chronic kidney disease: Secondary | ICD-10-CM

## 2024-02-21 ENCOUNTER — Encounter: Payer: Self-pay | Admitting: Pulmonary Disease

## 2024-02-21 MED ORDER — TIRZEPATIDE 5 MG/0.5ML ~~LOC~~ SOAJ: 5.0000 mg | SUBCUTANEOUS | 1 refills | Status: DC

## 2024-02-21 NOTE — Telephone Encounter (Signed)
 Please call her and let her know I bumped her dose up.

## 2024-02-24 ENCOUNTER — Ambulatory Visit: Payer: Medicare Other

## 2024-02-24 ENCOUNTER — Telehealth: Payer: Self-pay | Admitting: Pharmacist

## 2024-02-24 ENCOUNTER — Other Ambulatory Visit (HOSPITAL_COMMUNITY): Payer: Self-pay

## 2024-02-24 NOTE — Telephone Encounter (Signed)
 Received notification from [facility] regarding a prior authorization for [name]. Authorization has been APPROVED from 02/24/24 to 12/02/24. Approval letter sent to scan center.  Per test claim, copay for 28 days supply is $613.28  Authorization # [id number]  Scanned provider form, PA approval, insurance cad copy, and med list to  [employer] (placed in awaiting response folder in pharmacy office)  Chesley Mires, PharmD, MPH, BCPS, CPP Clinical Pharmacist (Rheumatology and Pulmonology)

## 2024-02-24 NOTE — Telephone Encounter (Signed)
 Patient form for [name] PAP renewal placed in file cabinet up front.  Prescriber form signed by Dr. Isaiah Serge  Submitted a Prior Authorization request to [facility] for [name] via CoverMyMeds. Will update once we receive a response.  Key: [id number]

## 2024-02-25 ENCOUNTER — Ambulatory Visit
Admission: RE | Admit: 2024-02-25 | Discharge: 2024-02-25 | Disposition: A | Discharge status: Home / Self Care | Source: Ambulatory Visit | Attending: Radiation Oncology | Admitting: Radiation Oncology

## 2024-02-25 ENCOUNTER — Other Ambulatory Visit: Payer: Self-pay

## 2024-02-25 ENCOUNTER — Ambulatory Visit
Admission: RE | Admit: 2024-02-25 | Discharge: 2024-02-25 | Disposition: A | Discharge status: Home / Self Care | Payer: Medicare Other | Source: Ambulatory Visit | Attending: Radiation Oncology | Admitting: Radiation Oncology

## 2024-02-25 DIAGNOSIS — C50411 Malignant neoplasm of upper-outer quadrant of right female breast: Secondary | ICD-10-CM | POA: Diagnosis not present

## 2024-02-25 LAB — RAD ONC ARIA SESSION SUMMARY
Course Elapsed Days: 21
Plan Fractions Treated to Date: 4
Plan Prescribed Dose Per Fraction: 5.7 Gy
Plan Total Fractions Prescribed: 5
Plan Total Prescribed Dose: 28.5 Gy
Reference Point Dosage Given to Date: 22.8 Gy
Reference Point Session Dosage Given: 5.7 Gy
Session Number: 4

## 2024-02-25 NOTE — Telephone Encounter (Addendum)
 Called patient to advise her to stop by clinic to sign [name] form. Left VM to patient to advise

## 2024-02-28 ENCOUNTER — Ambulatory Visit
Admission: EM | Admit: 2024-02-28 | Discharge: 2024-02-28 | Disposition: A | Discharge status: Home / Self Care | Attending: Physician Assistant | Admitting: Physician Assistant

## 2024-02-28 ENCOUNTER — Ambulatory Visit: Payer: Self-pay

## 2024-02-28 ENCOUNTER — Other Ambulatory Visit: Payer: Self-pay

## 2024-02-28 DIAGNOSIS — M109 Gout, unspecified: Secondary | ICD-10-CM | POA: Diagnosis not present

## 2024-02-28 DIAGNOSIS — M25572 Pain in left ankle and joints of left foot: Secondary | ICD-10-CM

## 2024-02-28 MED ORDER — PREDNISONE 20 MG PO TABS: 40.0000 mg | ORAL_TABLET | Freq: Every day | ORAL | 0 refills | Status: AC

## 2024-02-28 NOTE — ED Provider Notes (Addendum)
 Tara Campbell CARE    CSN: [id number] Arrival date & time: 02/28/24  1049      History   Chief Complaint Chief Complaint  Patient presents with   Ankle Pain    HPI Tara Campbell is a 78 y.o. female.   HPI Discussed the use of AI scribe software for clinical note transcription with the patient, who gave verbal consent to proceed.   The patient, with a history of gout, presents with acute ankle pain and swelling. They are accompanied by their sister Tara Campbell, who is a Designer, jewellery. The patient experienced a sudden onset of ankle pain and swelling yesterday, suspected to be a gout flare. The pain began around 10 AM and by 3 PM, the ankle was swollen to about twice its normal size, making ambulation very painful. Overnight, the swelling decreased, but significant pain persists, and there is now swelling in the back of the ankle. Yesterday, the swelling extended down to the heel. No recent trauma to the ankle, such as twisting or landing on it, and the ankle is warm to the touch compared to the other side. They have a history of gout, with a previous episode affecting the same ankle. They are currently on allopurinol for gout management and typically avoid NSAIDs due to medical advice, but occasionally use them for pain relief. They primarily use Tylenol for pain management. Initially, they managed the pain with Tylenol, which provided some relief. They also took ibuprofen this morning, which helped reduce the pain. In December, they were on prednisone for a severe upper respiratory infection. They mention that prednisone is effective for their asthma exacerbations.    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   Otomycosis of right ear 09/27/2017   Paroxysmal atrial fibrillation (HCC)    Pneumonia    11/2023   Thyroid disease    Trochanteric bursitis of right hip 05/29/2017    Patient Active Problem List   Diagnosis Date Noted   Genetic testing 12/24/2023   Malignant neoplasm of upper-outer quadrant of right breast in female, 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/2023   Coughing 01/17/2023   NSTEMI (non-ST elevated myocardial infarction) (HCC) 09/14/2022   Hypokalemia 09/14/2022   Non-STEMI (non-ST elevated myocardial infarction) (HCC) 09/14/2022   GAD (generalized anxiety disorder) 09/10/2022   Primary osteoarthritis of right knee 08/16/2022   Hypervitaminosis 01/03/2022   Memory impairment 01/03/2022   TIA (transient ischemic attack) 12/26/2021   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 10/02/2021   Obesity (BMI 30-39.9) 09/06/2021   Angina pectoris (HCC) 06/23/2021   Hyperlipidemia 04/27/2021   History of non-ST elevation myocardial infarction (NSTEMI) 04/25/2021   Hilar adenopathy 10/13/2018   Mediastinal adenopathy 10/13/2018   Dupuytren's contracture of left hand 08/28/2018   Trigger finger, left index finger 08/28/2018   Non-seasonal allergic rhinitis 08/26/2018   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   Controlled diabetes mellitus type 2 with complications (HCC) 12/27/2017  Class 1 obesity due to excess calories with serious comorbidity in adult 12/27/2017   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   Encounter for long-term (current) use of medications 11/28/2017   Anticoagulant long-term use - on eliquis for PAF 10/21/2017   Sensorineural hearing loss (SNHL) of both ears 09/27/2017   Hypertension goal BP (blood pressure) < 140/90 08/29/2017   CAD (coronary artery disease) 08/29/2017   Gout of ankle  04/08/2017   History of myocardial infarct at age greater than 60 years 01/23/2017   Mixed diabetic hyperlipidemia associated with type 2 diabetes mellitus (HCC) 01/23/2017   Acquired hypothyroidism 01/23/2017   Severe persistent allergic asthma 01/16/2017   Paroxysmal atrial fibrillation (HCC) 01/16/2017   Cardiomegaly 01/16/2017    Past Surgical History:  Procedure Laterality Date   ANGIOPLASTY     BREAST BIOPSY Right 12/03/2023   [place] RT BREAST BX W LOC DEV 1ST LESION IMG BX SPEC US GUIDE 12/03/2023 [facility]   BREAST BIOPSY  12/31/2023   MM RT RADIOACTIVE SEED LOC MAMMO GUIDE 12/31/2023 [facility]   BREAST LUMPECTOMY WITH RADIOACTIVE SEED LOCALIZATION Right 01/01/2024   Procedure: RIGHT BREAST LUMPECTOMY WITH RADIOACTIVE SEED LOCALIZATION;  Surgeon: [name], MD;  Location: [facility] OR;  Service: General;  Laterality: Right;   CARDIAC CATHETERIZATION     CATARACT EXTRACTION Bilateral 2010   COLONOSCOPY W/ POLYPECTOMY  01/04/2015   CORONARY STENT INTERVENTION N/A 08/31/2022   Procedure: CORONARY STENT INTERVENTION;  Surgeon: Tonny Bollman, MD;  Location: [facility] INVASIVE CV LAB;  Service: Cardiovascular;  Laterality: N/A;   HEMORRHOID BANDING  2015   LEFT HEART CATH AND CORONARY ANGIOGRAPHY N/A 04/26/2021   Procedure: LEFT HEART CATH AND CORONARY ANGIOGRAPHY;  Surgeon: Tonny Bollman, MD;  Location: [facility] INVASIVE CV LAB;  Service: Cardiovascular;  Laterality: N/A;   LEFT HEART CATH AND CORONARY ANGIOGRAPHY N/A 09/14/2022   Procedure: LEFT HEART CATH AND CORONARY ANGIOGRAPHY;  Surgeon: Tonny Bollman, MD;  Location: [facility] INVASIVE CV LAB;  Service: Cardiovascular;  Laterality: N/A;    OB History   No obstetric history on file.      Home Medications    Prior to Admission medications   Medication Sig Start Date End Date Taking? Authorizing Provider  predniSONE (DELTASONE) 20 MG tablet Take 2 tablets (40 mg total) by mouth daily for 5 days. Then take one tablet by  mouth for 3 days until finished. 02/28/24 03/04/24 Yes Hendryx Ricke E, PA-C  albuterol (VENTOLIN HFA) 108 (90 Base) MCG/ACT inhaler Inhale 2 puffs into the lungs every 6 (six) hours as needed for wheezing. 04/30/23   Mannam, Colbert Coyer, MD  allopurinol (ZYLOPRIM) 300 MG tablet Take 300 mg by mouth 2 (two) times daily.    [provider]  apixaban (ELIQUIS) 5 MG TABS tablet Take 1 tablet (5 mg total) by mouth 2 (two) times daily. 03/05/23   Agapito Games, MD  atenolol (TENORMIN) 100 MG tablet Take 1 tablet (100 mg total) by mouth daily. 12/31/23   Agapito Games, MD  atorvastatin (LIPITOR) 80 MG tablet Take 1 tablet (80 mg total) by mouth at bedtime. 04/26/22   Monica Becton, MD  clotrimazole-betamethasone (LOTRISONE) cream Apply 1 Application topically 2 (two) times daily. 2 weeks 12/18/23   Agapito Games, MD  cyanocobalamin (VITAMIN B12) 1000 MCG tablet Take 1,000 mcg by mouth daily.    [provider]  empagliflozin (JARDIANCE) 10 MG TABS tablet Take 1 tablet (10 mg total) by mouth daily before breakfast. 09/10/23   Metheney,  Barbarann Ehlers, MD  EPINEPHrine 0.3 mg/0.3 mL IJ SOAJ injection Inject 0.3 mg into the muscle as needed for anaphylaxis. Call 9-1-1 after use. 05/02/22   Parrett, Virgel Bouquet, NP  Evolocumab (REPATHA SURECLICK) 140 MG/ML SOAJ Inject 140 mg into the skin every 14 (fourteen) days. 12/24/23   Laurey Morale, MD  ezetimibe (ZETIA) 10 MG tablet Take 1 tablet (10 mg total) by mouth daily. 03/05/23   Agapito Games, MD  fluticasone furoate-vilanterol (BREO ELLIPTA) 200-25 MCG/ACT AEPB INHALE 1 PUFF INTO THE lungs DAILY 05/01/23   Mannam, Praveen, MD  glucose blood test strip To be used twice daily for testing blood sugars. E11.65 04/03/21   Agapito Games, MD  ipratropium (ATROVENT) 0.03 % nasal spray Place 2 sprays into both nostrils every 12 (twelve) hours. Patient taking differently: Place 2 sprays into both nostrils every 12 (twelve) hours as  needed for rhinitis. 11/23/22   Crain, Whitney L, PA  ipratropium-albuterol (DUONEB) 0.5-2.5 (3) MG/3ML SOLN Take 3 mLs by nebulization every 6 (six) hours as needed. 07/25/23   Mannam, Colbert Coyer, MD  irbesartan (AVAPRO) 300 MG tablet Take 150 mg by mouth every evening. 09/15/22   [provider]  isosorbide mononitrate (IMDUR) 120 MG 24 hr tablet Take 60-120 mg by mouth See admin instructions. Take 60 mg in the morning and 120 mg in the evening    [provider]  levothyroxine (SYNTHROID) 125 MCG tablet Take 1 tablet (125 mcg total) by mouth daily before breakfast. 06/04/23   Agapito Games, MD  Mepolizumab ([name]) 100 MG/ML SOAJ Inject 1 mL (100 mg total) into the skin every 28 (twenty-eight) days. 12/18/21   Mannam, Colbert Coyer, MD  metFORMIN (GLUCOPHAGE) 500 MG tablet Take 2 tablets (1,000 mg total) by mouth 2 (two) times daily with a meal. 03/05/23   Agapito Games, MD  nitroGLYCERIN (NITROSTAT) 0.4 MG SL tablet Place 1 tablet (0.4 mg total) under the tongue every 5 (five) minutes as needed for chest pain (or tightness). 01/09/24   Agapito Games, MD  Omega-3 Fatty Acids (FISH OIL) 1000 MG CAPS Take 1 capsule (1,000 mg total) by mouth daily. 04/26/22   Monica Becton, MD  ranolazine (RANEXA) 500 MG 12 hr tablet Take 1 tablet (500 mg total) by mouth 2 (two) times daily. 09/21/22   Alen Bleacher, NP  tirzepatide [facility]) 5 MG/0.5ML Pen Inject 5 mg into the skin once a week. 02/21/24   Agapito Games, MD  Vitamin D, Cholecalciferol, 25 MCG (1000 UT) TABS Take 25 mcg by mouth daily in the afternoon. 12/27/21   [place], Alvira Philips, DO  zinc gluconate 50 MG tablet Take 50 mg by mouth at bedtime.    [provider]    Family History Family History  Problem Relation Age of Onset   Hypertension Mother    Hyperlipidemia Mother    Alzheimer's disease Mother    Vascular Disease Mother    Glaucoma Father    Heart disease Father    Hypertension Father     Diabetes Father    Hyperlipidemia Father    Skin cancer Father        SCC; dx 66s-80s   Diabetes Paternal Grandmother    Colon cancer Neg Hx    Esophageal cancer Neg Hx     Social History Social History   Tobacco Use   Smoking status: Never   Smokeless tobacco: Never  Vaping Use   Vaping status: Never Used  Substance Use  Topics   Alcohol use: Not Currently    Comment: Rare   Drug use: No     Allergies   Sulfamethoxazole, Sertraline, Sulfa antibiotics, and Tape   Review of Systems Review of Systems  Musculoskeletal:  Positive for arthralgias and joint swelling.     Physical Exam Triage Vital Signs ED Triage Vitals  Encounter Vitals Group     BP 02/28/24 1110 (!) 153/73     Systolic BP Percentile --      Diastolic BP Percentile --      Pulse Rate 02/28/24 1110 (!) 106     Resp 02/28/24 1110 18     Temp 02/28/24 1110 98 F (36.7 C)     Temp src --      SpO2 02/28/24 1110 96 %     Weight --      Height --      Head Circumference --      Peak Flow --      Pain Score 02/28/24 1114 3     Pain Loc --      Pain Education --      Exclude from Growth Chart --    No data found.  Updated Vital Signs BP (!) 153/73   Pulse (!) 106   Temp 98 F (36.7 C)   Resp 18   SpO2 96%   Visual Acuity Right Eye Distance:   Left Eye Distance:   Bilateral Distance:    Right Eye Near:   Left Eye Near:    Bilateral Near:     Physical Exam Vitals reviewed.  Constitutional:      General: She is awake.     Appearance: Normal appearance. She is well-developed and well-groomed.  HENT:     Head: Normocephalic and atraumatic.  Pulmonary:     Effort: Pulmonary effort is normal.  Musculoskeletal:     Left ankle: Swelling present. Tenderness present. Normal range of motion. Anterior drawer test negative. Normal pulse.     Left Achilles Tendon: No tenderness.     Left foot: Normal range of motion. No swelling or deformity. Normal pulse.     Comments: Patient has some  swelling and tenderness to the posterior aspect of the left ankle.  No obvious malformation or abnormality on palpation.  Left ankle is notably warm to the touch compared to the right.  Posterior tibialis and dorsalis pedis pulses are normal and brisk bilaterally. She is able to dorsiflex, plantarflex, supinate, pronate but reports some tenderness with dorsiflexion.  Skin:    General: Skin is warm and dry.  Neurological:     General: No focal deficit present.     Mental Status: She is alert and oriented to person, place, and time.  Psychiatric:        Mood and Affect: Mood normal.        Behavior: Behavior normal. Behavior is cooperative.        Thought Content: Thought content normal.        Judgment: Judgment normal.      UC Treatments / Results  Labs (all labs ordered are listed, but only abnormal results are displayed) Labs Reviewed - No data to display  EKG   Radiology No results found.  Procedures Procedures (including critical care time)  Medications Ordered in UC Medications - No data to display  Initial Impression / Assessment and Plan / UC Course  I have reviewed the triage vital signs and the nursing notes.  Pertinent labs & imaging  results that were available during my care of the patient were reviewed by me and considered in my medical decision making (see chart for details).      Final Clinical Impressions(s) / UC Diagnoses   Final diagnoses:  Acute left ankle pain  Acute gout of left ankle, unspecified cause   Gout flare  Acute gout flare in the right ankle with swelling, warmth, and significant pain. Recurrent gout in the same joint. Currently on allopurinol for chronic management. NSAIDs are generally discouraged due to potential renal concerns, but ibuprofen was used for pain relief. Plan to assess renal function to determine the appropriateness of colchicine or prednisone for acute management. Hydration and acetaminophen are recommended for pain  management.  Colchicine appears to interact with patient's Ranexa.  Per up-to-date she is eligible to have colchicine based on eGFR of 48 but due to medication interaction recommend starting prednisone.  Will do prednisone 40 mg p.o. daily x 5 days with taper to 20 mg once per day for the following 3 days.  Recommend dietary changes and increased hydration efforts to assist with further gout management.  Can continue to use Tylenol as needed for pain management.  Recommend close follow-up with primary care provider if symptoms or not improving or seem to be worsening.     Discharge Instructions      Based on your symptoms and your physical exam I suspect that you are likely having a gout flare.  You are already taking allopurinol to help mitigate this but given your current flare I recommend that we start a steroid to help reduce your symptoms and inflammation.  I have sent in a medication called prednisone for you to take as directed.  Please take 40 mg once per day for 5 days.  Then take 20 mg once per day for 3 days to help taper off.  You can continue to use Tylenol as needed for pain management.  I also recommend changing your diet according to the patient education materials provided in your after visit summary.  This can also help with reducing gout flares.  If you feel like your symptoms are getting worse or not improving you can return here or follow-up with your primary care provider per your preference.     ED Prescriptions     Medication Sig Dispense Auth. Provider   predniSONE (DELTASONE) 20 MG tablet Take 2 tablets (40 mg total) by mouth daily for 5 days. Then take one tablet by mouth for 3 days until finished. 13 tablet Min Collymore E, PA-C      PDMP not reviewed this encounter.   Providence Crosby, PA-C 02/28/24 1230    Giordan Fordham, Oswaldo Conroy, PA-C 02/28/24 1231

## 2024-02-28 NOTE — Telephone Encounter (Signed)
  Chief Complaint: L ankle swelling Symptoms: pain, swelling,  Frequency: yesterday started Pertinent Negatives: Patient denies fever, redness, injury, SOB, long trips by plane/car.  Disposition: [] ED /[x] Urgent Care (no appt availability in office) / [x] Appointment(In office/virtual)/ []  [place]/ [] Home Care/ [] Refused Recommended Disposition /[] [place] [facility]/ []  Follow-up with PCP Additional Notes: Pt calling with what she believes is a gout flare. Pt states that it feels and presents the same as it has in the past. Pt states the swelling and pain started yesterday. Pt states that she has been taking her allopurinol as prescribed, no diet changes. Pt is DM2, pt A1C yesterday was 6.0. No appts in clinic, today, pt will be utilizing UC. Pt advised to call back should she develop worsening s/s including SOB.  Copied from CRM [id number]. Topic: Clinical - Red Word Triage >> Feb 28, 2024  9:18 AM Nyra Capes wrote: Red Word that prompted transfer to Nurse Triage: Patient called in. Gout flare up in the afternoon on 03/28  left ankle was really puffy and swollen and tripled in size and went down to the heel, 9 on pain scale. This morning swelling is down and pain when touching the ankle and 4 pain scale when moving,  Patient  phone #  [phone] Reason for Disposition  MILD or MODERATE ankle swelling (e.g., can't move joint normally, can't do usual activities) (Exceptions: Itchy, localized swelling; swelling is chronic.)  Answer Assessment - Initial Assessment Questions 1. LOCATION: "Which ankle is swollen?" "Where is the swelling?"     L ankle 2. ONSET: "When did the swelling start?"     Started yesterday 3. SWELLING: "How bad is the swelling?" Or, "How large is it?" (e.g., mild, moderate, severe; size of localized swelling)    - NONE: No joint swelling.   - LOCALIZED: Localized; small area of puffy or swollen skin (e.g., insect bite, skin irritation).   - MILD: Joint looks or  feels mildly swollen or puffy.   - MODERATE: Swollen; interferes with normal activities (e.g., work or school); decreased range of movement; may be limping.   - SEVERE: Very swollen; can't move swollen joint at all; limping a lot or unable to walk.     moderate 4. PAIN: "Is there any pain?" If Yes, ask: "How bad is it?" (Scale 1-10; or mild, moderate, severe)   - NONE (0): no pain.   - MILD (1-3): doesn't interfere with normal activities.    - MODERATE (4-7): interferes with normal activities (e.g., work or school) or awakens from sleep, limping.    - SEVERE (8-10): excruciating pain, unable to do any normal activities, unable to walk.      Today about a 4 5. CAUSE: "What do you think caused the ankle swelling?"     gout 6. OTHER SYMPTOMS: "Do you have any other symptoms?" (e.g., fever, chest pain, difficulty breathing, calf pain)     denies  Protocols used: Ankle Swelling-A-AH

## 2024-02-28 NOTE — Telephone Encounter (Signed)
 FYI

## 2024-02-28 NOTE — ED Triage Notes (Signed)
 Yesterday started what she thinks is gout, states left ankle is swollen and painful. Taken tylenol and ibuprofen. States that brought swelling down but her ankle is still painful.

## 2024-02-28 NOTE — Discharge Instructions (Addendum)
 Based on your symptoms and your physical exam I suspect that you are likely having a gout flare.  You are already taking allopurinol to help mitigate this but given your current flare I recommend that we start a steroid to help reduce your symptoms and inflammation.  I have sent in a medication called prednisone for you to take as directed.  Please take 40 mg once per day for 5 days.  Then take 20 mg once per day for 3 days to help taper off.  You can continue to use Tylenol as needed for pain management.  I also recommend changing your diet according to the patient education materials provided in your after visit summary.  This can also help with reducing gout flares.  If you feel like your symptoms are getting worse or not improving you can return here or follow-up with your primary care provider per your preference.

## 2024-03-02 ENCOUNTER — Ambulatory Visit: Payer: Medicare Other

## 2024-03-03 ENCOUNTER — Inpatient Hospital Stay: Payer: Medicare Other | Admitting: Hematology and Oncology

## 2024-03-03 ENCOUNTER — Other Ambulatory Visit: Payer: Self-pay

## 2024-03-03 ENCOUNTER — Ambulatory Visit
Admission: RE | Admit: 2024-03-03 | Discharge: 2024-03-03 | Disposition: A | Discharge status: Home / Self Care | Payer: Medicare Other | Source: Ambulatory Visit | Attending: Radiation Oncology | Admitting: Radiation Oncology

## 2024-03-03 VITALS — BP 163/74 | HR 95 | Temp 98.3°F | Resp 19 | Ht 64.0 in | Wt 185.3 lb

## 2024-03-03 DIAGNOSIS — Z1732 Human epidermal growth factor receptor 2 negative status: Secondary | ICD-10-CM | POA: Insufficient documentation

## 2024-03-03 DIAGNOSIS — Z170 Estrogen receptor positive status [ER+]: Secondary | ICD-10-CM | POA: Insufficient documentation

## 2024-03-03 DIAGNOSIS — C50411 Malignant neoplasm of upper-outer quadrant of right female breast: Secondary | ICD-10-CM | POA: Insufficient documentation

## 2024-03-03 DIAGNOSIS — Z79811 Long term (current) use of aromatase inhibitors: Secondary | ICD-10-CM | POA: Insufficient documentation

## 2024-03-03 DIAGNOSIS — Z1721 Progesterone receptor positive status: Secondary | ICD-10-CM | POA: Insufficient documentation

## 2024-03-03 DIAGNOSIS — Z923 Personal history of irradiation: Secondary | ICD-10-CM | POA: Insufficient documentation

## 2024-03-03 DIAGNOSIS — Z882 Allergy status to sulfonamides status: Secondary | ICD-10-CM | POA: Insufficient documentation

## 2024-03-03 DIAGNOSIS — Z79899 Other long term (current) drug therapy: Secondary | ICD-10-CM | POA: Insufficient documentation

## 2024-03-03 DIAGNOSIS — N6310 Unspecified lump in the right breast, unspecified quadrant: Secondary | ICD-10-CM | POA: Insufficient documentation

## 2024-03-03 DIAGNOSIS — Z7901 Long term (current) use of anticoagulants: Secondary | ICD-10-CM | POA: Insufficient documentation

## 2024-03-03 LAB — RAD ONC ARIA SESSION SUMMARY
Course Elapsed Days: 28
Plan Fractions Treated to Date: 5
Plan Prescribed Dose Per Fraction: 5.7 Gy
Plan Total Fractions Prescribed: 5
Plan Total Prescribed Dose: 28.5 Gy
Reference Point Dosage Given to Date: 28.5 Gy
Reference Point Session Dosage Given: 5.7 Gy
Session Number: 5

## 2024-03-03 MED ORDER — ANASTROZOLE 1 MG PO TABS: 1.0000 mg | ORAL_TABLET | Freq: Every day | ORAL | 3 refills | Status: AC

## 2024-03-03 NOTE — Progress Notes (Signed)
 Patient Care Team: Agapito Games, MD as PCP - General (Family Medicine) Laurey Morale, MD as PCP - Advanced Heart Failure (Cardiology) Lupita Leash, MD as Consulting Physician (Pulmonary Disease) Jens Som Madolyn Frieze, MD as Consulting Physician (Cardiology) Gabriel Carina, [facility] (Inactive) as Pharmacist (Pharmacist) Glendale Chard, DO as Consulting Physician (Neurology) [facility], Dory Peru, MD as Rounding Team (Cardiology) Donnelly Angelica, RN as Oncology Nurse Navigator Pershing Proud, RN as Oncology Nurse Navigator [name], MD as Consulting Physician (General Surgery) Serena Croissant, MD as Consulting Physician (Hematology and Oncology) Dorothy Puffer, MD as Consulting Physician (Radiation Oncology)  DIAGNOSIS:  Encounter Diagnosis  Name Primary?   Malignant neoplasm of upper-outer quadrant of right breast in female, estrogen receptor positive (HCC) Yes    SUMMARY OF ONCOLOGIC HISTORY: Oncology History  Malignant neoplasm of upper-outer quadrant of right breast in female, estrogen receptor positive (HCC)  12/03/2023 Initial Diagnosis   Screening mammogram detected right breast mass at 10 o'clock position measuring 0.8 cm, axilla negative, ultrasound-guided biopsy revealed grade 2 IDC with DCIS ER 95%, PR 20%, HER2 negative, Ki-67 10%   12/11/2023 Cancer Staging   Staging form: Breast, AJCC 8th Edition - Clinical stage from 12/11/2023: Stage IA (cT1b, cN0, cM0, G2, ER+, PR+, HER2-) - Signed by Ronny Bacon, PA-C on 12/11/2023 Stage prefix: Initial diagnosis Method of lymph node assessment: Clinical Histologic grading system: 3 grade system   12/20/2023 Genetic Testing   Negative [facility] CancerNext-Expanded +RNAinsight Panel.  Report date is 12/20/2023.   The CancerNext-Expanded gene panel offered by [facility] and includes sequencing, rearrangement, and RNA analysis for the following 76 genes: AIP, ALK, APC, ATM, AXIN2, BAP1, BARD1, BMPR1A, BRCA1,  BRCA2, BRIP1, CDC73, CDH1, CDK4, CDKN1B, CDKN2A, CEBPA, CHEK2, CTNNA1, DDX41, DICER1, ETV6, FH, FLCN, GATA2, LZTR1, MAX, MBD4, MEN1, MET, MLH1, MSH2, MSH3, MSH6, MUTYH, NF1, NF2, NTHL1, PALB2, PHOX2B, PMS2, POT1, PRKAR1A, PTCH1, PTEN, RAD51C, RAD51D, RB1, RET, RUNX1, SDHA, SDHAF2, SDHB, SDHC, SDHD, SMAD4, SMARCA4, SMARCB1, SMARCE1, STK11, SUFU, TMEM127, TP53, TSC1, TSC2, VHL, and WT1 (sequencing and deletion/duplication); EGFR, HOXB13, KIT, MITF, PDGFRA, POLD1, and POLE (sequencing only); EPCAM and GREM1 (deletion/duplication only).     01/01/2024 Surgery   Right lumpectomy: Grade 2 IDC 1 cm, intermediate grade DCIS, margins negative, LVI not identified, ER 95%, PR 20%, HER2 1+ negative, Ki67 10%     CHIEF COMPLIANT: Follow-up after radiation is completed  HISTORY OF PRESENT ILLNESS: History of Present Illness Tara Campbell, a patient with a history of breast cancer and heart disease, presents for her final treatment day. She reports that her overall treatment has been painless with no significant side effects such as redness or burning. She also mentions that she has osteoarthritis but not osteoporosis.     ALLERGIES:  is allergic to sulfamethoxazole, sertraline, sulfa antibiotics, and tape.  MEDICATIONS:  Current Outpatient Medications  Medication Sig Dispense Refill   albuterol (VENTOLIN HFA) 108 (90 Base) MCG/ACT inhaler Inhale 2 puffs into the lungs every 6 (six) hours as needed for wheezing. 18 g 5   allopurinol (ZYLOPRIM) 300 MG tablet Take 300 mg by mouth 2 (two) times daily.     apixaban (ELIQUIS) 5 MG TABS tablet Take 1 tablet (5 mg total) by mouth 2 (two) times daily. 180 tablet 3   atenolol (TENORMIN) 100 MG tablet Take 1 tablet (100 mg total) by mouth daily. 90 tablet 1   atorvastatin (LIPITOR) 80 MG tablet Take 1 tablet (80 mg total) by mouth at bedtime. 90  tablet 3   clotrimazole-betamethasone (LOTRISONE) cream Apply 1 Application topically 2 (two) times daily. 2 weeks 30 g 0    cyanocobalamin (VITAMIN B12) 1000 MCG tablet Take 1,000 mcg by mouth daily.     empagliflozin (JARDIANCE) 10 MG TABS tablet Take 1 tablet (10 mg total) by mouth daily before breakfast. 100 tablet 4   EPINEPHrine 0.3 mg/0.3 mL IJ SOAJ injection Inject 0.3 mg into the muscle as needed for anaphylaxis. Call 9-1-1 after use. 1 each 2   Evolocumab (REPATHA SURECLICK) 140 MG/ML SOAJ Inject 140 mg into the skin every 14 (fourteen) days. 6 mL 1   ezetimibe (ZETIA) 10 MG tablet Take 1 tablet (10 mg total) by mouth daily. 90 tablet 3   fluticasone furoate-vilanterol (BREO ELLIPTA) 200-25 MCG/ACT AEPB INHALE 1 PUFF INTO THE lungs DAILY 60 each 5   glucose blood test strip To be used twice daily for testing blood sugars. E11.65 200 each 11   ipratropium (ATROVENT) 0.03 % nasal spray Place 2 sprays into both nostrils every 12 (twelve) hours. (Patient taking differently: Place 2 sprays into both nostrils every 12 (twelve) hours as needed for rhinitis.) 30 mL 0   ipratropium-albuterol (DUONEB) 0.5-2.5 (3) MG/3ML SOLN Take 3 mLs by nebulization every 6 (six) hours as needed. 3 mL PRN   irbesartan (AVAPRO) 300 MG tablet Take 150 mg by mouth every evening.     isosorbide mononitrate (IMDUR) 120 MG 24 hr tablet Take 60-120 mg by mouth See admin instructions. Take 60 mg in the morning and 120 mg in the evening     levothyroxine (SYNTHROID) 125 MCG tablet Take 1 tablet (125 mcg total) by mouth daily before breakfast. 90 tablet 0   Mepolizumab ([name]) 100 MG/ML SOAJ Inject 1 mL (100 mg total) into the skin every 28 (twenty-eight) days. 1 mL 4   metFORMIN (GLUCOPHAGE) 500 MG tablet Take 2 tablets (1,000 mg total) by mouth 2 (two) times daily with a meal. 180 tablet 3   nitroGLYCERIN (NITROSTAT) 0.4 MG SL tablet Place 1 tablet (0.4 mg total) under the tongue every 5 (five) minutes as needed for chest pain (or tightness). 25 tablet PRN   Omega-3 Fatty Acids (FISH OIL) 1000 MG CAPS Take 1 capsule (1,000 mg total) by mouth  daily. 90 capsule 3   predniSONE (DELTASONE) 20 MG tablet Take 2 tablets (40 mg total) by mouth daily for 5 days. Then take one tablet by mouth for 3 days until finished. 13 tablet 0   ranolazine (RANEXA) 500 MG 12 hr tablet Take 1 tablet (500 mg total) by mouth 2 (two) times daily. 60 tablet 5   tirzepatide (MOUNJARO) 5 MG/0.5ML Pen Inject 5 mg into the skin once a week. 6 mL 1   Vitamin D, Cholecalciferol, 25 MCG (1000 UT) TABS Take 25 mcg by mouth daily in the afternoon. 60 tablet    zinc gluconate 50 MG tablet Take 50 mg by mouth at bedtime.     No current facility-administered medications for this visit.    PHYSICAL EXAMINATION: ECOG PERFORMANCE STATUS: 1 - Symptomatic but completely ambulatory  Vitals:   03/03/24 1508  BP: (!) 163/74  Pulse: 95  Resp: 19  Temp: 98.3 F (36.8 C)  SpO2: 99%   Filed Weights   03/03/24 1508  Weight: 185 lb 4.8 oz (84.1 kg)      LABORATORY DATA:  I have reviewed the data as listed    Latest Ref Rng & Units 12/11/2023   12:22 PM  08/06/2023   10:02 AM 06/04/2023   11:30 AM  CMP  Glucose 70 - 99 mg/dL 540  981  191   BUN 8 - 23 mg/dL 20  13  17    Creatinine 0.44 - 1.00 mg/dL 4.78  2.95  6.21   Sodium 135 - 145 mmol/L 137  139  139   Potassium 3.5 - 5.1 mmol/L 4.0  4.1  4.5   Chloride 98 - 111 mmol/L 104  103  104   CO2 22 - 32 mmol/L 24  26  26    Calcium 8.9 - 10.3 mg/dL 9.5  30.8  65.7   Total Protein 6.5 - 8.1 g/dL 6.3   6.6   Total Bilirubin 0.0 - 1.2 mg/dL 0.4   0.4   Alkaline Phos 38 - 126 U/L 81     AST 15 - 41 U/L 11   13   ALT 0 - 44 U/L 15   12     Lab Results  Component Value Date   WBC 16.3 (H) 12/11/2023   HGB 12.5 12/11/2023   HCT 36.8 12/11/2023   MCV 87.4 12/11/2023   PLT 371 12/11/2023   NEUTROABS 14.7 (H) 12/11/2023    ASSESSMENT & PLAN:  Malignant neoplasm of upper-outer quadrant of right breast in female, estrogen receptor positive (HCC) 12/03/2023:Screening mammogram detected right breast mass at 10 o'clock  position measuring 0.8 cm, axilla negative, ultrasound-guided biopsy revealed grade 2 IDC with DCIS ER 95%, PR 20%, HER2 negative, Ki-67 10%    01/01/2024: Right lumpectomy: Grade 2 IDC 1 cm, intermediate grade DCIS, margins negative, LVI not identified, ER 95%, PR 20%, HER2 1+ negative, Ki67 10%   Treatment plan: radiation 02/11/2024-03/03/2024 Adjuvant antiestrogen therapy with anastrozole 1 mg daily started 03/03/2024   Anastrozole counseling: We discussed the risks and benefits of anti-estrogen therapy with aromatase inhibitors. These include but not limited to insomnia, hot flashes, mood changes, vaginal dryness, bone density loss, and weight gain. We strongly believe that the benefits far outweigh the risks. Patient understands these risks and consented to starting treatment. Planned treatment duration is 5 years.  Return to clinic in 3 months for survivorship care plan visit   Assessment & Plan Estrogen receptor-positive breast cancer She has completed her final treatment for estrogen receptor-positive breast cancer, which was well-tolerated without significant side effects. She is transitioning to maintenance therapy with anastrozole, an aromatase inhibitor. Anastrozole is associated with potential side effects including hot flashes, joint stiffness, and increased risk of osteoporosis. She requires monitoring for osteoporosis, although she does not currently have it. Weight-bearing exercises and calcium and vitamin D supplementation are recommended to mitigate osteoporosis risk. - Prescribe anastrozole 1 mg orally daily at bedtime. - Monitor bone density every two years due to osteoporosis risk associated with anastrozole. - Encourage weight-bearing exercises to mitigate osteoporosis risk. - Ensure continuation of calcium and vitamin D supplementation. - Send a 90-day supply prescription to her pharmacy.  Survivorship care She will transition to survivorship care, addressing post-treatment  health and wellness, including diet, nutrition, supplements, skin, bone, hair, and sexual health. This program is unique to the cancer center and tailored to cancer survivors. - Schedule an appointment with Lillia Abed, a lifestyle medicine expert, for survivorship care. - Set up a timeline for annual mammograms.  Follow-up She will have follow-up appointments to monitor her health post-treatment. - Schedule a follow-up appointment with the oncologist in nine months. - Ensure a scheduled appointment with Lillia Abed in three months for survivorship care.  No orders of the defined types were placed in this encounter.  The patient has a good understanding of the overall plan. she agrees with it. she will call with any problems that may develop before the next visit here. Total time spent: 30 mins including face to face time and time spent for planning, charting and co-ordination of care   Tara Meek, MD 03/03/24

## 2024-03-03 NOTE — Assessment & Plan Note (Signed)
 12/03/2023:Screening mammogram detected right breast mass at 10 o'clock position measuring 0.8 cm, axilla negative, ultrasound-guided biopsy revealed grade 2 IDC with DCIS ER 95%, PR 20%, HER2 negative, Ki-67 10%    01/01/2024: Right lumpectomy: Grade 2 IDC 1 cm, intermediate grade DCIS, margins negative, LVI not identified, ER 95%, PR 20%, HER2 1+ negative, Ki67 10%   Treatment plan: radiation 02/11/2024-03/03/2024 Adjuvant antiestrogen therapy with anastrozole 1 mg daily started 03/03/2024   Anastrozole counseling: We discussed the risks and benefits of anti-estrogen therapy with aromatase inhibitors. These include but not limited to insomnia, hot flashes, mood changes, vaginal dryness, bone density loss, and weight gain. We strongly believe that the benefits far outweigh the risks. Patient understands these risks and consented to starting treatment. Planned treatment duration is 5 years.  Return to clinic in 3 months for survivorship care plan visit

## 2024-03-04 NOTE — Radiation Completion Notes (Addendum)
  Radiation Oncology         [phone] ________________________________  Name: Tara Campbell MRN: [id number]  Date of Service: 03/03/2024  DOB: 16-Mar-1946  End of Treatment Note    Diagnosis: Stage IA, pT1bN0M0, grade 2 ER/PR positive invasive ductal carinoma of the right breast   Intent: Curative     ==========DELIVERED PLANS==========  First Treatment Date: 2024-02-04 Last Treatment Date: 2024-03-03   Plan Name: Breast_R Site: Breast, Right Technique: 3D Mode: Photon Dose Per Fraction: 5.7 Gy Prescribed Dose (Delivered / Prescribed): 28.5 Gy / 28.5 Gy Prescribed Fxs (Delivered / Prescribed): 5 / 5     ==========ON TREATMENT VISIT DATES========== 2024-02-25   See weekly On Treatment Notes in Epic for details in the Media tab (listed as Progress notes on the On Treatment Visit Dates listed above). The patient tolerated radiation. She developed anticipated skin changes in the treatment field.   The patient will receive a call in about one month from the radiation oncology department. She will continue follow up with Dr. Gudena as well.      Shelvia Dick, PAC

## 2024-03-09 ENCOUNTER — Ambulatory Visit: Payer: Medicare Other

## 2024-03-16 NOTE — Telephone Encounter (Signed)
 Submitted Patient Assistance Application to [employer] for Tara Campbell along with provider portion, patient portion, PA, medication list, insurance card copy and income documents. Will update patient when we receive a response.  Phone #: [phone] Fax #: [phone]

## 2024-03-17 ENCOUNTER — Ambulatory Visit (INDEPENDENT_AMBULATORY_CARE_PROVIDER_SITE_OTHER): Payer: Medicare Other | Admitting: Family Medicine

## 2024-03-17 ENCOUNTER — Encounter: Payer: Self-pay | Admitting: Family Medicine

## 2024-03-17 VITALS — BP 136/68 | HR 94 | Ht 64.0 in | Wt 189.0 lb

## 2024-03-17 DIAGNOSIS — E118 Type 2 diabetes mellitus with unspecified complications: Secondary | ICD-10-CM

## 2024-03-17 DIAGNOSIS — I209 Angina pectoris, unspecified: Secondary | ICD-10-CM | POA: Diagnosis not present

## 2024-03-17 DIAGNOSIS — E1122 Type 2 diabetes mellitus with diabetic chronic kidney disease: Secondary | ICD-10-CM

## 2024-03-17 DIAGNOSIS — E782 Mixed hyperlipidemia: Secondary | ICD-10-CM

## 2024-03-17 DIAGNOSIS — Z7984 Long term (current) use of oral hypoglycemic drugs: Secondary | ICD-10-CM

## 2024-03-17 DIAGNOSIS — I10 Essential (primary) hypertension: Secondary | ICD-10-CM

## 2024-03-17 DIAGNOSIS — N1831 Chronic kidney disease, stage 3a: Secondary | ICD-10-CM

## 2024-03-17 LAB — POCT GLYCOSYLATED HEMOGLOBIN (HGB A1C): Hemoglobin A1C: 5.7 % — AB (ref 4.0–5.6)

## 2024-03-17 MED ORDER — TIRZEPATIDE 7.5 MG/0.5ML ~~LOC~~ SOAJ
7.5000 mg | SUBCUTANEOUS | 1 refills | Status: AC
Start: 2024-03-17 — End: ?

## 2024-03-17 NOTE — Progress Notes (Signed)
 Established Patient Office Visit  Subjective  Patient ID: Tara Campbell, female    DOB: March 17, 1946  Age: 78 y.o. MRN: [id number]  Chief Complaint  Patient presents with   Hypertension   Diabetes    HPI  Follow-up diabetes-so far she has actually been tolerating the Mounjaro 5 mg well she has had to start taking a stool softener but that seems to be effective.  She does note that she feels more full more quickly and definitely has a decreased appetite. She is not actively exercising.    She completed her radiation for right breast cancer.      ROS    Objective:     BP 136/68   Pulse 94   Ht 5\' 4"  (1.626 m)   Wt 189 lb (85.7 kg)   SpO2 98%   BMI 32.44 kg/m    Physical Exam Vitals and nursing note reviewed.  Constitutional:      Appearance: Normal appearance.  HENT:     Head: Normocephalic and atraumatic.  Eyes:     Conjunctiva/sclera: Conjunctivae normal.  Cardiovascular:     Rate and Rhythm: Normal rate and regular rhythm.  Pulmonary:     Effort: Pulmonary effort is normal.     Breath sounds: Normal breath sounds.  Skin:    General: Skin is warm and dry.  Neurological:     Mental Status: She is alert.  Psychiatric:        Mood and Affect: Mood normal.      Results for orders placed or performed in visit on 03/17/24  POCT HgB A1C  Result Value Ref Range   Hemoglobin A1C 5.7 (A) 4.0 - 5.6 %   HbA1c POC (<> result, manual entry)     HbA1c, POC (prediabetic range)     HbA1c, POC (controlled diabetic range)        The ASCVD Risk score (Arnett DK, et al., 2019) failed to calculate for the following reasons:   Risk score cannot be calculated because patient has a medical history suggesting prior/existing ASCVD    Assessment & Plan:   Problem List Items Addressed This Visit       Cardiovascular and Mediastinum   Hypertension goal BP (blood pressure) < 140/90 (Chronic)   BP initially high today but repeat was much better.       Relevant  Orders   CMP14+EGFR   Lipid panel   Angina pectoris (HCC)     Endocrine   Controlled diabetes mellitus type 2 with complications (HCC) - Primary (Chronic)   Relevant Medications   tirzepatide (MOUNJARO) 7.5 MG/0.5ML Pen   Other Relevant Orders   POCT HgB A1C (Completed)   CMP14+EGFR   Lipid panel   Type 2 diabetes mellitus with diabetic chronic kidney disease (HCC)   A1c looks absolutely phenomenal today at 5.7 which is down from 7.6 back in January she is doing really well.  Like to go ahead and push the [facility] to 7.5 and stop the metformin when she starts the 7.5 mg dose.  And we will continue to just work on portion control as well as weight loss which should in turn also help with blood pressure readings.  Continue with Jardiance for now.  Continue because of the added renal benefit.      Relevant Medications   tirzepatide (MOUNJARO) 7.5 MG/0.5ML Pen   Other Relevant Orders   CMP14+EGFR   Lipid panel     Other   Hyperlipidemia   Due to  recheck lipid panel in 1 to see the improvement on the Repatha and the Zetia.       Return in about 14 weeks (around 06/23/2024) for Diabetes follow-up.    Duaine German, MD

## 2024-03-17 NOTE — Assessment & Plan Note (Addendum)
 A1c looks absolutely phenomenal today at 5.7 which is down from 7.6 back in January she is doing really well.  Like to go ahead and push the Mounjaro to 7.5 and stop the metformin when she starts the 7.5 mg dose.  And we will continue to just work on portion control as well as weight loss which should in turn also help with blood pressure readings.  Continue with Jardiance for now.  Continue because of the added renal benefit.

## 2024-03-17 NOTE — Assessment & Plan Note (Signed)
 BP initially high today but repeat was much better.

## 2024-03-17 NOTE — Patient Instructions (Addendum)
 Ok to stop metformin when you start the 7.5 mg of the Mounjaro.

## 2024-03-17 NOTE — Assessment & Plan Note (Signed)
 Due to recheck lipid panel in 1 to see the improvement on the Repatha and the Zetia.

## 2024-03-18 ENCOUNTER — Encounter: Payer: Self-pay | Admitting: Family Medicine

## 2024-03-18 LAB — LIPID PANEL
Chol/HDL Ratio: 4.4 ratio (ref 0.0–4.4)
Cholesterol, Total: 258 mg/dL — ABNORMAL HIGH (ref 100–199)
HDL: 58 mg/dL (ref >39)
LDL Chol Calc (NIH): 159 mg/dL — ABNORMAL HIGH (ref 0–99)
Triglycerides: 225 mg/dL — ABNORMAL HIGH (ref 0–149)
VLDL Cholesterol Cal: 41 mg/dL — ABNORMAL HIGH (ref 5–40)

## 2024-03-18 LAB — CMP14+EGFR
ALT: 12 IU/L (ref 0–32)
AST: 14 IU/L (ref 0–40)
Albumin: 4.4 g/dL (ref 3.8–4.8)
Alkaline Phosphatase: 111 IU/L (ref 44–121)
BUN/Creatinine Ratio: 11 — ABNORMAL LOW (ref 12–28)
BUN: 12 mg/dL (ref 8–27)
Bilirubin Total: 0.3 mg/dL (ref 0.0–1.2)
CO2: 21 mmol/L (ref 20–29)
Calcium: 10.5 mg/dL — ABNORMAL HIGH (ref 8.7–10.3)
Chloride: 102 mmol/L (ref 96–106)
Creatinine, Ser: 1.1 mg/dL — ABNORMAL HIGH (ref 0.57–1.00)
Globulin, Total: 2.3 g/dL (ref 1.5–4.5)
Glucose: 112 mg/dL — ABNORMAL HIGH (ref 70–99)
Potassium: 4.3 mmol/L (ref 3.5–5.2)
Sodium: 140 mmol/L (ref 134–144)
Total Protein: 6.7 g/dL (ref 6.0–8.5)
eGFR: 51 mL/min/{1.73_m2} — ABNORMAL LOW (ref >59)

## 2024-03-18 NOTE — Progress Notes (Signed)
 Hi Tara Campbell, it was good to see you and Tara Campbell yesterday.  Your serum creatinine looks great at 1.1 so better than it did a few months ago which is awesome.  Liver function also looks great.  Calcium level was a borderline elevated not in a worrisome range.  I am not particularly concerned about it so you do not necessarily need to make any changes or adjustments we will do a panel next time anyway and it will have a calcium on it so we will keep an eye on it.    I know you are on the Repatha now.  Just wanted to verify if you had missed any doses lately or run out of medication .  Normally Repatha will lower your LDL by about 30 to 50%.  And it looks like your reduction is only about 16%.  I am going to forward these to Dr. Mitzie Anda as well.  And just have him take a look.

## 2024-03-25 ENCOUNTER — Ambulatory Visit: Payer: Medicare Other | Admitting: Pulmonary Disease

## 2024-03-25 ENCOUNTER — Encounter: Payer: Self-pay | Admitting: Pulmonary Disease

## 2024-03-25 VITALS — BP 122/64 | HR 97 | Temp 97.6°F | Ht 64.0 in | Wt 183.4 lb

## 2024-03-25 DIAGNOSIS — J4550 Severe persistent asthma, uncomplicated: Secondary | ICD-10-CM

## 2024-03-25 NOTE — Progress Notes (Signed)
 Tara Campbell    [id number]    1946/10/21  Primary Care Physician:Metheney, Corita Diego, MD  Referring Physician: Cydney Draft, MD [address] [place],  [place] [address]  Chief complaint:   Follow up for asthma Started [name]  in March 2022  HPI: 78 y.o. with severe persistent asthma, nasal polyposis, allergies She is on maximal therapy with LABA/LAMA/ICS, Singulair  and is getting allergy shots with the allergist at [facility] to have significant symptoms with recurrent exacerbations.  She is needed to go on prednisone  4 times the past year. Has chronic dyspnea on exertion, wheeze  Started [name]  in March 2022 with good control of symptoms  Underwent right heart cath by Dr. Arlester Ladd on October 2023 with findings of patent coronary vessels with recommendation to continue medical therapy.    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  No feather pillows or comforters Smoking history: Never smoker Travel history: Originally from [place] .  Moved to [place] in 2017 Relevant family history: No significant family history of lung disease  Interim history: Discussed the use of AI scribe software for clinical note transcription with the patient, who gave verbal consent to proceed.  History of Present Illness Tara Campbell "Tara Campbell" is a 78 year old female with severe persistent asthma, eosinophilic asthma, and allergic rhinitis with nasal polyps who presents for follow-up and medication management.  She has a history of severe persistent asthma, eosinophilic asthma, and allergic rhinitis with nasal polyps. Previously on Breo, she has been switched to Breztri  and has been on [name]  since March 2022. [name]  has significantly improved her asthma control, as her asthma was never fully controlled prior to starting it. She is not currently on Singulair  (montelukast ).  She experienced  breathing issues prior to her surgery, which were managed with a stronger dose of prednisone , resolving the symptoms. She has not required prednisone  since then.  She is experiencing issues with obtaining [name]  due to a delay in patient assistance application renewal, which is critical as she is due for her next dose soon. She is concerned about the cost of the medication if not covered by assistance. She administers [name]  at home and is comfortable with the process.  She underwent a lumpectomy on December 31, 2023, followed by radiation therapy. She is currently on anastrozole  (Arimidex ) for five years as part of her breast cancer treatment regimen. She reports doing well post-surgery and radiation, describing the process as 'a piece of cake'.  She moved from [place]  seven years ago and has been under the current asthma management since then.    Outpatient Encounter Medications as of 03/25/2024  Medication Sig   albuterol  (VENTOLIN  HFA) 108 (90 Base) MCG/ACT inhaler Inhale 2 puffs into the lungs every 6 (six) hours as needed for wheezing.   allopurinol  (ZYLOPRIM ) 300 MG tablet Take 300 mg by mouth 2 (two) times daily.   anastrozole  (ARIMIDEX ) 1 MG tablet Take 1 tablet (1 mg total) by mouth daily.   apixaban  (ELIQUIS ) 5 MG TABS tablet Take 1 tablet (5 mg total) by mouth 2 (two) times daily.   atenolol  (TENORMIN ) 100 MG tablet Take 1 tablet (100 mg total) by mouth daily.   atorvastatin  (LIPITOR ) 80 MG tablet Take 1 tablet (80 mg total) by mouth at bedtime.   BREZTRI  AEROSPHERE 160-9-4.8 MCG/ACT AERO inhaler Inhale 2 puffs into the lungs 2 (two) times daily.  cyanocobalamin  (VITAMIN B12) 1000 MCG tablet Take 1,000 mcg by mouth daily.   empagliflozin  (JARDIANCE ) 10 MG TABS tablet Take 1 tablet (10 mg total) by mouth daily before breakfast.   EPINEPHrine  0.3 mg/0.3 mL IJ SOAJ injection Inject 0.3 mg into the muscle as needed for anaphylaxis. Call 9-1-1 after use.   Evolocumab  (REPATHA  SURECLICK)  140 MG/ML SOAJ Inject 140 mg into the skin every 14 (fourteen) days.   ezetimibe  (ZETIA ) 10 MG tablet Take 1 tablet (10 mg total) by mouth daily.   glucose blood test strip To be used twice daily for testing blood sugars. E11.65   ipratropium (ATROVENT ) 0.03 % nasal spray Place 2 sprays into both nostrils every 12 (twelve) hours. (Patient taking differently: Place 2 sprays into both nostrils every 12 (twelve) hours as needed for rhinitis.)   ipratropium-albuterol  (DUONEB) 0.5-2.5 (3) MG/3ML SOLN Take 3 mLs by nebulization every 6 (six) hours as needed.   irbesartan  (AVAPRO ) 300 MG tablet Take 150 mg by mouth every evening.   isosorbide  mononitrate (IMDUR ) 120 MG 24 hr tablet Take 60-120 mg by mouth See admin instructions. Take 60 mg in the morning and 120 mg in the evening   levothyroxine  (SYNTHROID ) 125 MCG tablet Take 1 tablet (125 mcg total) by mouth daily before breakfast.   Mepolizumab  ([name] ) 100 MG/ML SOAJ Inject 1 mL (100 mg total) into the skin every 28 (twenty-eight) days.   nitroGLYCERIN  (NITROSTAT ) 0.4 MG SL tablet Place 1 tablet (0.4 mg total) under the tongue every 5 (five) minutes as needed for chest pain (or tightness).   Omega-3 Fatty Acids (FISH OIL ) 1000 MG CAPS Take 1 capsule (1,000 mg total) by mouth daily.   ranolazine  (RANEXA ) 500 MG 12 hr tablet Take 1 tablet (500 mg total) by mouth 2 (two) times daily.   tirzepatide (MOUNJARO) 7.5 MG/0.5ML Pen Inject 7.5 mg into the skin once a week.   Vitamin D , Cholecalciferol , 25 MCG (1000 UT) TABS Take 25 mcg by mouth daily in the afternoon.   zinc  gluconate 50 MG tablet Take 50 mg by mouth at bedtime.   [DISCONTINUED] fluticasone  furoate-vilanterol (BREO ELLIPTA ) 200-25 MCG/ACT AEPB INHALE 1 PUFF INTO THE lungs DAILY   No facility-administered encounter medications on file as of 03/25/2024.    Physical Exam: Blood pressure 122/64, pulse 97, temperature 97.6 F (36.4 C), temperature source Oral, height 5\' 4"  (1.626 m), weight 183 lb  6.4 oz (83.2 kg), SpO2 99%. Gen:      No acute distress HEENT:  EOMI, sclera anicteric Neck:     No masses; no thyromegaly Lungs:    Clear to auscultation bilaterally; normal respiratory effort CV:         Regular rate and rhythm; no murmurs Abd:      + bowel sounds; soft, non-tender; no palpable masses, no distension Ext:    No edema; adequate peripheral perfusion Skin:      Warm and dry; no rash Neuro: alert and oriented x 3 Psych: normal mood and affect   Data Reviewed: Imaging: Chest x-ray 08/16/2020-no acute cardiopulmonary disease.  Chest x-ray 04/25/2021-no active cardiopulmonary disease Chest x-ray 12/26/2021-no acute cardiopulmonary disease Chest x-ray 01/21/2023-no acute cardiopulmonary disease. I have reviewed the images personally  PFTs: 11/17/2020 FVC 1.74 [62%], FEV1 1.17 (56%], F/F 67 No significant obstruction, restriction suggested by reduced lung volumes.  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 Previously required prednisone  post-surgery in January, but currently stable on [name]  and Breztri . Difficulty obtaining [name]  due to pharmacy issues, which is critical for asthma control. [name]  has significantly improved asthma control since initiation in March 2022.  - Send urgent message to pharmacy team regarding [name]  availability. - Explore possibility of obtaining [name]  samples. - Consider written prescription for [name]  as a backup if specialty pharmacy cannot supply.  Allergic rhinitis with nasal polyps Allergic rhinitis with nasal polyps,  well-managed. No specific issues discussed during the visit.  Breast cancer Breast cancer, status post lumpectomy and radiation therapy. Currently on anastrozole  (Arimidex ) for five years as part of ongoing management. No current issues reported.    Plan/Recommendations: Continue [name] , Breztri  Check with pharmacy about status of patient assistance application  Isayah Ignasiak MD [facility] 03/25/2024, 10:07 AM  CC: Cydney Draft, *

## 2024-03-25 NOTE — Telephone Encounter (Signed)
 Called [employer] for update on pt's application. Per rep it was processed yesterday (4/22) and the EoB should be on it's way to us  now. Rep stated that a PA approval letter was required, I informed her that the letter was in fact sent with the initial submission and should have been the very last page. She confirmed that every other single document that was sent had been received correctly EXCEPT for the PA approval letter. She states that they already have the PA approval number from the insurance, they just need physical documentation.  PA letter refaxed, she states that this is the only thing left that his holding the process up. Will await further correspondence.  Pt provided with sample pen at the request of Dr. Waylan Haggard Exp: 08/2025 Lot# [id number]

## 2024-03-25 NOTE — Patient Instructions (Signed)
 Will check with pharmacy about status of your [name]  application Continue Breztri  Follow-up in 6 months

## 2024-04-06 ENCOUNTER — Ambulatory Visit
Admission: RE | Admit: 2024-04-06 | Discharge: 2024-04-06 | Disposition: A | Discharge status: Home / Self Care | Source: Ambulatory Visit | Attending: Hematology and Oncology | Admitting: Hematology and Oncology

## 2024-04-06 NOTE — Progress Notes (Signed)
  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   Symptoms of fatigue have improved since completing therapy.  Symptoms of skin changes have improved since completing therapy.  The patient was encouraged to avoid sun exposure in the area of prior treatment for up to one year following radiation with either sunscreen or by the style of clothing worn in the sun.  The patient has scheduled follow up with her medical oncologist Dr. Gudena for ongoing surveillance, and was encouraged to call if she develops concerns or questions regarding radiation.   This concludes the interaction.  Avery Bodo, LPN

## 2024-04-08 ENCOUNTER — Ambulatory Visit: Payer: Self-pay | Admitting: Pulmonary Disease

## 2024-04-08 DIAGNOSIS — R051 Acute cough: Secondary | ICD-10-CM | POA: Diagnosis not present

## 2024-04-08 DIAGNOSIS — R0600 Dyspnea, unspecified: Secondary | ICD-10-CM | POA: Diagnosis not present

## 2024-04-08 NOTE — Telephone Encounter (Signed)
**Note De-identified  [place] Obfuscation** Please advise 

## 2024-04-08 NOTE — Telephone Encounter (Signed)
 Spoke with [employer] patient assistance program representative to discuss status of application. Per representative the case is still in process. They are missing income documentation from the patient.   They contacted the patient on 04/07/2024 to request this missing information. Patient was informed that any documents depicting bank statements with social security deposits, or tax forms would be acceptable for submission. Patient asked for the company to give her one week turnaround time to provide this documentation. Will continue to follow.   Tolu Trever Streater, PharmD [facility] PGY-1

## 2024-04-08 NOTE — Telephone Encounter (Signed)
 Copied from CRM [id number]. Topic: Clinical - Red Word Triage >> Apr 08, 2024 12:30 PM Tara Campbell wrote: Red Word that prompted transfer to Nurse Triage:    Symptoms for 5 days SOB Difficulty to breath Chest and back pain Course of prednisone  20 mg 2 x day was completed and still has symptoms.  TRIAGE SUMMARY NOTE: Pt reporting that she just finished a course of prednisone  day before yesterday, but started having SOB 3 days ago and chest and back pain with heavy cough 2 days ago. Pt confirms that her chest and back pain are only present with each cough but can having coughing fits for up to 10 min with this pain coming and going per cough. Pt reporting that she is SOB at rest and with exertion, wheezing a little bit, confirms not coughing anything up, no nausea, sweating, or fever, but "really heavy non-stop" white-clear nasal discharge. Pt confirms she is currently in [place] for next 1.5 weeks, only brought 3 sleeves of albuterol  nebulizer solution, been using every 4-6 hours with relief of SOB each time. Advised pt be examined in next 4 hours for symptoms at [facility] or ED, sending HP message to pulm office to call pt back with further recommendations. Advised pt can do rescue treatments of nebulizer if SOB real bad, 2-3x 20 min apart, advised if no improvement from that or if symptoms worsening go to ED. Pt verbalized understanding. Please advise.  E2C2 Pulmonary Triage - Initial Assessment Questions "Chief Complaint (e.g., cough, sob, wheezing, fever, chills, sweat or additional symptoms) *Go to specific symptom protocol after initial questions. Prednisone  Chest and back pain now 2 days ago with heavy cough Using nebulizer on regular basis Finished prednisone  day before yesterday that's when other symptoms started to magnify SOB at rest and with exertion, comes and goes but it when it comes it's really strong to point where can't catch your breath, feel like struggling to breathe when that  happens Wheezing a little bit No nausea, sweating, fever Not coughing anything up, heavy nasal discharge white clear, really heavy non-stop Only when actively cough coming and going for 10 min only with cough  "How long have symptoms been present?" SOB for 3 days  "Have you used your inhalers/maintenance medication?" Yes If yes, "What medications?" Mostly the nebulizer albuterol , breztri , only brought 3 sleeves of nebulizer medication, out of town in [place] for another week and a half  If inhaler, ask "How many puffs and how often?" Note: Review instructions on medication in the chart. Albuterol  nebulizer every 4-6 hours, does improve it, feel more relaxed after  OXYGEN: "Do you wear supplemental oxygen?" No  "Do you monitor your oxygen levels?" Yes If yes, "What is your reading (oxygen level) today?" Unsure of today  Reason for Disposition  [1] Longstanding difficulty breathing (e.g., CHF, COPD, emphysema) AND [2] WORSE than normal  Answer Assessment - Initial Assessment Questions 1. LOCATION: "Where does it hurt?"       Middle of chest maybe 2. RADIATION: "Does the pain go anywhere else?" (e.g., into neck, jaw, arms, back)     Back high in shoulder blades 4. PATTERN: "Does the pain come and go, or has it been constant since it started?"  "Does it get worse with exertion?"      Comes and goes, chest and back pain only when coughing 5. DURATION: "How long does it last" (e.g., seconds, minutes, hours)     Until I get some relief with nebulizer, 10 min or  more, coughing whole time 6. SEVERITY: "How bad is the pain?"  (e.g., Scale 1-10; mild, moderate, or severe)    - MILD (1-3): doesn't interfere with normal activities     - MODERATE (4-7): interferes with normal activities or awakens from sleep    - SEVERE (8-10): excruciating pain, unable to do any normal activities       7-8/10 7. CARDIAC RISK FACTORS: "Do you have any history of heart problems or risk factors for heart  disease?" (e.g., angina, prior heart attack; diabetes, high blood pressure, high cholesterol, smoker, or strong family history of heart disease)     significant 8. PULMONARY RISK FACTORS: "Do you have any history of lung disease?"  (e.g., blood clots in lung, asthma, emphysema, birth control pills)     significant  Protocols used: Chest Pain-A-AH, Breathing Difficulty-A-AH

## 2024-04-09 DIAGNOSIS — J45901 Unspecified asthma with (acute) exacerbation: Secondary | ICD-10-CM | POA: Diagnosis not present

## 2024-04-09 DIAGNOSIS — R059 Cough, unspecified: Secondary | ICD-10-CM | POA: Diagnosis not present

## 2024-06-01 NOTE — Telephone Encounter (Addendum)
 Called [employer] for [name]  patient assistance application status. Per rep, application is still in process. Patient last spoke with [employer] on 04/07/2024. She never turned in income documents to [employer]. They will not move application forward without this  ATC patient but unable to reach. Left VM with ym callback number and callback number with [employer] PAP  Sherry Pennant, PharmD, MPH, BCPS, CPP Clinical Pharmacist (Rheumatology and Pulmonology)

## 2024-06-01 NOTE — Assessment & Plan Note (Signed)
 12/03/2023:Screening mammogram detected right breast mass at 10 o'clock position measuring 0.8 cm, axilla negative, ultrasound-guided biopsy revealed grade 2 IDC with DCIS ER 95%, PR 20%, HER2 negative, Ki-67 10%    01/01/2024: Right lumpectomy: Grade 2 IDC 1 cm, intermediate grade DCIS, margins negative, LVI not identified, ER 95%, PR 20%, HER2 1+ negative, Ki67 10%   Treatment plan: radiation 02/11/2024-03/03/2024 Adjuvant antiestrogen therapy with anastrozole  1 mg daily started 03/03/2024   Anastrozole  Toxicities:  Breast Cancer Surveillance: Breast Exam: 06/02/24: Benign Mammogram: to be done Dec 2025

## 2024-06-02 ENCOUNTER — Inpatient Hospital Stay: Attending: Radiation Oncology | Admitting: Hematology and Oncology

## 2024-06-02 VITALS — BP 132/57 | HR 97 | Temp 97.6°F | Resp 16 | Ht 64.0 in | Wt 178.2 lb

## 2024-06-02 DIAGNOSIS — Z780 Asymptomatic menopausal state: Secondary | ICD-10-CM

## 2024-06-02 DIAGNOSIS — Z1732 Human epidermal growth factor receptor 2 negative status: Secondary | ICD-10-CM | POA: Diagnosis not present

## 2024-06-02 DIAGNOSIS — Z79899 Other long term (current) drug therapy: Secondary | ICD-10-CM | POA: Diagnosis not present

## 2024-06-02 DIAGNOSIS — Z1721 Progesterone receptor positive status: Secondary | ICD-10-CM | POA: Diagnosis not present

## 2024-06-02 DIAGNOSIS — N6310 Unspecified lump in the right breast, unspecified quadrant: Secondary | ICD-10-CM | POA: Insufficient documentation

## 2024-06-02 DIAGNOSIS — C50411 Malignant neoplasm of upper-outer quadrant of right female breast: Secondary | ICD-10-CM | POA: Diagnosis not present

## 2024-06-02 DIAGNOSIS — Z170 Estrogen receptor positive status [ER+]: Secondary | ICD-10-CM | POA: Diagnosis not present

## 2024-06-02 DIAGNOSIS — Z882 Allergy status to sulfonamides status: Secondary | ICD-10-CM | POA: Diagnosis not present

## 2024-06-02 DIAGNOSIS — Z79811 Long term (current) use of aromatase inhibitors: Secondary | ICD-10-CM | POA: Diagnosis not present

## 2024-06-02 DIAGNOSIS — Z7901 Long term (current) use of anticoagulants: Secondary | ICD-10-CM | POA: Insufficient documentation

## 2024-06-02 NOTE — Progress Notes (Signed)
 Patient Care Team: Alvan Dorothyann BIRCH, MD as PCP - General (Family Medicine) Rolan Ezra RAMAN, MD as PCP - Advanced Heart Failure (Cardiology) Alaine Vicenta NOVAK, MD as Consulting Physician (Pulmonary Disease) Pietro Redell RAMAN, MD as Consulting Physician (Cardiology) Beverli Cara PARAS, [facility] (Inactive) as Pharmacist (Pharmacist) Tobie Tonita POUR, DO as Consulting Physician (Neurology) [facility], Rande, MD as Rounding Team (Cardiology) Tyree Nanetta SAILOR, RN as Oncology Nurse Navigator Glean, Stephane BROCKS, RN (Inactive) as Oncology Nurse Navigator Ebbie Cough, MD as Consulting Physician (General Surgery) Odean Potts, MD as Consulting Physician (Hematology and Oncology) Dewey Rush, MD as Consulting Physician (Radiation Oncology)  DIAGNOSIS:  Encounter Diagnosis  Name Primary?   Malignant neoplasm of upper-outer quadrant of right breast in female, estrogen receptor positive (HCC) Yes    SUMMARY OF ONCOLOGIC HISTORY: Oncology History  Malignant neoplasm of upper-outer quadrant of right breast in female, estrogen receptor positive (HCC)  12/03/2023 Initial Diagnosis   Screening mammogram detected right breast mass at 10 o'clock position measuring 0.8 cm, axilla negative, ultrasound-guided biopsy revealed grade 2 IDC with DCIS ER 95%, PR 20%, HER2 negative, Ki-67 10%   12/11/2023 Cancer Staging   Staging form: Breast, AJCC 8th Edition - Clinical stage from 12/11/2023: Stage IA (cT1b, cN0, cM0, G2, ER+, PR+, HER2-) - Signed by Lanell Donald Stagger, PA-C on 12/11/2023 Stage prefix: Initial diagnosis Method of lymph node assessment: Clinical Histologic grading system: 3 grade system   12/20/2023 Genetic Testing   Negative [facility] CancerNext-Expanded +RNAinsight Panel.  Report date is 12/20/2023.   The CancerNext-Expanded gene panel offered by [facility] and includes sequencing, rearrangement, and RNA analysis for the following 76 genes: AIP, ALK, APC, ATM, AXIN2, BAP1, BARD1,  BMPR1A, BRCA1, BRCA2, BRIP1, CDC73, CDH1, CDK4, CDKN1B, CDKN2A, CEBPA, CHEK2, CTNNA1, DDX41, DICER1, ETV6, FH, FLCN, GATA2, LZTR1, MAX, MBD4, MEN1, MET, MLH1, MSH2, MSH3, MSH6, MUTYH, NF1, NF2, NTHL1, PALB2, PHOX2B, PMS2, POT1, PRKAR1A, PTCH1, PTEN, RAD51C, RAD51D, RB1, RET, RUNX1, SDHA, SDHAF2, SDHB, SDHC, SDHD, SMAD4, SMARCA4, SMARCB1, SMARCE1, STK11, SUFU, TMEM127, TP53, TSC1, TSC2, VHL, and WT1 (sequencing and deletion/duplication); EGFR, HOXB13, KIT, MITF, PDGFRA, POLD1, and POLE (sequencing only); EPCAM and GREM1 (deletion/duplication only).     01/01/2024 Surgery   Right lumpectomy: Grade 2 IDC 1 cm, intermediate grade DCIS, margins negative, LVI not identified, ER 95%, PR 20%, HER2 1+ negative, Ki67 10%     CHIEF COMPLIANT: Follow-up on anastrozole  therapy  HISTORY OF PRESENT ILLNESS: History of Present Illness Tara Campbell is an 78 year old female who presents for a follow-up visit after three months on her current medication regimen.  She remains stable on her current medication regimen with no hot flashes, joint stiffness, or muscle cramps. She experiences mood swings, which she attributes to menopause, and manages with magnesium supplements. She is aware of the potential for bone loss due to her medication and takes vitamin D  and calcium  supplements. She is due for a bone density test, with the last one conducted in October 2022.     ALLERGIES:  is allergic to sulfamethoxazole, sertraline , sulfa antibiotics, and tape.  MEDICATIONS:  Current Outpatient Medications  Medication Sig Dispense Refill   albuterol  (VENTOLIN  HFA) 108 (90 Base) MCG/ACT inhaler Inhale 2 puffs into the lungs every 6 (six) hours as needed for wheezing. 18 g 5   allopurinol  (ZYLOPRIM ) 300 MG tablet Take 300 mg by mouth 2 (two) times daily.     anastrozole  (ARIMIDEX ) 1 MG tablet Take 1 tablet (1 mg total) by mouth  daily. 90 tablet 3   apixaban  (ELIQUIS ) 5 MG TABS tablet Take 1 tablet (5 mg total) by  mouth 2 (two) times daily. 180 tablet 3   atenolol  (TENORMIN ) 100 MG tablet Take 1 tablet (100 mg total) by mouth daily. 90 tablet 1   atorvastatin  (LIPITOR ) 80 MG tablet Take 1 tablet (80 mg total) by mouth at bedtime. 90 tablet 3   BREZTRI  AEROSPHERE 160-9-4.8 MCG/ACT AERO inhaler Inhale 2 puffs into the lungs 2 (two) times daily.     cyanocobalamin  (VITAMIN B12) 1000 MCG tablet Take 1,000 mcg by mouth daily.     empagliflozin  (JARDIANCE ) 10 MG TABS tablet Take 1 tablet (10 mg total) by mouth daily before breakfast. 100 tablet 4   EPINEPHrine  0.3 mg/0.3 mL IJ SOAJ injection Inject 0.3 mg into the muscle as needed for anaphylaxis. Call 9-1-1 after use. 1 each 2   Evolocumab  (REPATHA  SURECLICK) 140 MG/ML SOAJ Inject 140 mg into the skin every 14 (fourteen) days. 6 mL 1   ezetimibe  (ZETIA ) 10 MG tablet Take 1 tablet (10 mg total) by mouth daily. 90 tablet 3   glucose blood test strip To be used twice daily for testing blood sugars. E11.65 200 each 11   ipratropium (ATROVENT ) 0.03 % nasal spray Place 2 sprays into both nostrils every 12 (twelve) hours. 30 mL 0   ipratropium-albuterol  (DUONEB) 0.5-2.5 (3) MG/3ML SOLN Take 3 mLs by nebulization every 6 (six) hours as needed. 3 mL PRN   irbesartan  (AVAPRO ) 300 MG tablet Take 150 mg by mouth every evening.     isosorbide  mononitrate (IMDUR ) 120 MG 24 hr tablet Take 60-120 mg by mouth See admin instructions. Take 60 mg in the morning and 120 mg in the evening     levothyroxine  (SYNTHROID ) 125 MCG tablet Take 1 tablet (125 mcg total) by mouth daily before breakfast. 90 tablet 0   Mepolizumab  ([name] ) 100 MG/ML SOAJ Inject 1 mL (100 mg total) into the skin every 28 (twenty-eight) days. 1 mL 4   nitroGLYCERIN  (NITROSTAT ) 0.4 MG SL tablet Place 1 tablet (0.4 mg total) under the tongue every 5 (five) minutes as needed for chest pain (or tightness). 25 tablet PRN   Omega-3 Fatty Acids (FISH OIL ) 1000 MG CAPS Take 1 capsule (1,000 mg total) by mouth daily. 90  capsule 3   ranolazine  (RANEXA ) 500 MG 12 hr tablet Take 1 tablet (500 mg total) by mouth 2 (two) times daily. 60 tablet 5   tirzepatide  (MOUNJARO ) 7.5 MG/0.5ML Pen Inject 7.5 mg into the skin once a week. 6 mL 1   Vitamin D , Cholecalciferol , 25 MCG (1000 UT) TABS Take 25 mcg by mouth daily in the afternoon. 60 tablet    zinc  gluconate 50 MG tablet Take 50 mg by mouth at bedtime.     No current facility-administered medications for this visit.    PHYSICAL EXAMINATION: ECOG PERFORMANCE STATUS: 1 - Symptomatic but completely ambulatory  Vitals:   06/02/24 1448  BP: (!) 132/57  Pulse: 97  Resp: 16  Temp: 97.6 F (36.4 C)  SpO2: 97%   Filed Weights   06/02/24 1448  Weight: 178 lb 3.2 oz (80.8 kg)      LABORATORY DATA:  I have reviewed the data as listed    Latest Ref Rng & Units 03/17/2024   10:28 AM 12/11/2023   12:22 PM 08/06/2023   10:02 AM  CMP  Glucose 70 - 99 mg/dL 887  720  873   BUN 8 -  27 mg/dL 12  20  13    Creatinine 0.57 - 1.00 mg/dL 8.89  8.71  8.84   Sodium 134 - 144 mmol/L 140  137  139   Potassium 3.5 - 5.2 mmol/L 4.3  4.0  4.1   Chloride 96 - 106 mmol/L 102  104  103   CO2 20 - 29 mmol/L 21  24  26    Calcium  8.7 - 10.3 mg/dL 89.4  9.5  89.8   Total Protein 6.0 - 8.5 g/dL 6.7  6.3    Total Bilirubin 0.0 - 1.2 mg/dL 0.3  0.4    Alkaline Phos 44 - 121 IU/L 111  81    AST 0 - 40 IU/L 14  11    ALT 0 - 32 IU/L 12  15      Lab Results  Component Value Date   WBC 16.3 (H) 12/11/2023   HGB 12.5 12/11/2023   HCT 36.8 12/11/2023   MCV 87.4 12/11/2023   PLT 371 12/11/2023   NEUTROABS 14.7 (H) 12/11/2023    ASSESSMENT & PLAN:  Malignant neoplasm of upper-outer quadrant of right breast in female, estrogen receptor positive (HCC) 12/03/2023:Screening mammogram detected right breast mass at 10 o'clock position measuring 0.8 cm, axilla negative, ultrasound-guided biopsy revealed grade 2 IDC with DCIS ER 95%, PR 20%, HER2 negative, Ki-67 10%    01/01/2024: Right  lumpectomy: Grade 2 IDC 1 cm, intermediate grade DCIS, margins negative, LVI not identified, ER 95%, PR 20%, HER2 1+ negative, Ki67 10%   Treatment plan: radiation 02/11/2024-03/03/2024 Adjuvant antiestrogen therapy with anastrozole  1 mg daily started 03/03/2024   Anastrozole  Toxicities: Tolerating it extremely well without any problems or concerns.  Denies any hot flashes or arthralgias or myalgias.  Breast Cancer Surveillance: Bone density to be done in the next 2 months Mammogram: to be done Dec 2025: I will set up that appointment   Return to clinic in 1 year for follow-up    No orders of the defined types were placed in this encounter.  The patient has a good understanding of the overall plan. she agrees with it. she will call with any problems that may develop before the next visit here. Total time spent: 30 mins including face to face time and time spent for planning, charting and co-ordination of care   Viinay K Camille Dragan, MD 06/02/24

## 2024-06-16 ENCOUNTER — Ambulatory Visit (INDEPENDENT_AMBULATORY_CARE_PROVIDER_SITE_OTHER): Admitting: Family Medicine

## 2024-06-16 VITALS — BP 130/70 | HR 95 | Ht 64.0 in | Wt 176.3 lb

## 2024-06-16 DIAGNOSIS — E118 Type 2 diabetes mellitus with unspecified complications: Secondary | ICD-10-CM | POA: Diagnosis not present

## 2024-06-16 DIAGNOSIS — L299 Pruritus, unspecified: Secondary | ICD-10-CM

## 2024-06-16 DIAGNOSIS — I10 Essential (primary) hypertension: Secondary | ICD-10-CM | POA: Diagnosis not present

## 2024-06-16 DIAGNOSIS — Z7984 Long term (current) use of oral hypoglycemic drugs: Secondary | ICD-10-CM

## 2024-06-16 DIAGNOSIS — E782 Mixed hyperlipidemia: Secondary | ICD-10-CM

## 2024-06-16 LAB — POCT GLYCOSYLATED HEMOGLOBIN (HGB A1C): Hemoglobin A1C: 5.6 % (ref 4.0–5.6)

## 2024-06-16 MED ORDER — ACETIC ACID 2 % OT SOLN: 6.0000 [drp] | Freq: Four times a day (QID) | OTIC | 0 refills | Status: DC

## 2024-06-16 NOTE — Progress Notes (Signed)
 Established Patient Office Visit  Subjective  Patient ID: Tara Campbell, female    DOB: 10-01-1946  Age: 78 y.o. MRN: [id number]  Chief Complaint  Patient presents with   Diabetes    HPI  Diabetes - no hypoglycemic events. No wounds or sores that are not healing well. No increased thirst or urination. Checking glucose at home. Taking medications as prescribed without any side effects.On mounjaro  7.5mg   Eye appt is scheduled.   She also wanted to let me know since I last saw her she went to visit her daughter just [place].  She stayed in their bedroom that night and the dog came in the rain she and the dog ended up sort of chasing each other and she tripped and fell and hit the armoire she actually hit her head she started getting mentally confused and ended up going to the emergency department per recommendation of her sister they diagnosed her with a concussion she says the symptoms lasted for about a total of 5 to 6 days and then she felt better after she got back she started experiencing an upper respiratory infection which then turned into pneumonia she says it took her a while to get over it but she is feeling much better.  She and her sister are planning on starting a chair exercise class.  She is also had some bilateral ear itching a little worse on the left compared to the right.  She is always had issues with some dry wax but normally it does not quite itch this much.  She has used a little bit of Debrox    Wt Readings from Last 3 Encounters:  06/16/24 176 lb 4.8 oz (80 kg)  06/02/24 178 lb 3.2 oz (80.8 kg)  03/25/24 183 lb 6.4 oz (83.2 kg)    ROS    Objective:     BP 130/70 (BP Location: Left Arm, Cuff Size: Normal)   Pulse 95   Ht 5' 4 (1.626 m)   Wt 176 lb 4.8 oz (80 kg)   SpO2 96%   BMI 30.26 kg/m     Physical Exam Vitals and nursing note reviewed.  Constitutional:      Appearance: Normal appearance.  HENT:     Head: Normocephalic and  atraumatic.     Right Ear: Tympanic membrane, ear canal and external ear normal.     Left Ear: Tympanic membrane, ear canal and external ear normal.  Eyes:     Conjunctiva/sclera: Conjunctivae normal.  Cardiovascular:     Rate and Rhythm: Normal rate and regular rhythm.  Pulmonary:     Effort: Pulmonary effort is normal.     Breath sounds: Normal breath sounds.  Skin:    General: Skin is warm and dry.  Neurological:     Mental Status: She is alert.  Psychiatric:        Mood and Affect: Mood normal.      Results for orders placed or performed in visit on 06/16/24  POCT HgB A1C  Result Value Ref Range   Hemoglobin A1C 5.6 4.0 - 5.6 %   HbA1c POC (<> result, manual entry)     HbA1c, POC (prediabetic range)     HbA1c, POC (controlled diabetic range)         The ASCVD Risk score (Arnett DK, et al., 2019) failed to calculate for the following reasons:   The 2019 ASCVD risk score is only valid for ages 24 to 40   Risk  score cannot be calculated because patient has a medical history suggesting prior/existing ASCVD    Assessment & Plan:   Problem List Items Addressed This Visit       Cardiovascular and Mediastinum   Hypertension goal BP (blood pressure) < 140/90 (Chronic)   Pete blood pressure looks good.        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 Continue current regimen. Follow up in  25mo.  We discussed option of either staying at current dose on the Mounjaro  or going up.  She still working on healthy diet and exercise so would like to stay at the 7.5 mg.  He is down 7 pounds from April which is fantastic.  Encouraged her to maybe try the chair exercises that she is considering doing I think that would be fantastic and continue to work on portion control.  Lab Results  Component Value Date   HGBA1C 5.6 06/16/2024         Relevant Orders   POCT HgB A1C (Completed)   Urine Microalbumin w/creat.  ratio     Other   Hyperlipidemia   Currently on Repatha  but recent LDL was not quite at goal also on Zetia .  Follows with Dr. Rolan.      Other Visit Diagnoses       Ear itching    -  Primary       Ear itching recommend a trial of acetic acid  drops if not improving then please let me know allergies sometimes can cause internal ear itching as well so we could consider a trial of Flonase  or fluticasone  if the drops are not helping.  Return in about 3 months (around 09/16/2024) for DM.    Dorothyann Byars, MD

## 2024-06-16 NOTE — Assessment & Plan Note (Addendum)
 Well controlled. Continue current regimen. Follow up in  81mo.  We discussed option of either staying at current dose on the Mounjaro  or going up.  She still working on healthy diet and exercise so would like to stay at the 7.5 mg.  He is down 7 pounds from April which is fantastic.  Encouraged her to maybe try the chair exercises that she is considering doing I think that would be fantastic and continue to work on portion control.  Lab Results  Component Value Date   HGBA1C 5.6 06/16/2024

## 2024-06-16 NOTE — Assessment & Plan Note (Signed)
 Currently on Repatha  but recent LDL was not quite at goal also on Zetia .  Follows with Dr. Rolan.

## 2024-06-16 NOTE — Assessment & Plan Note (Signed)
 Tara Campbell blood pressure looks good.

## 2024-06-17 LAB — SPECIMEN STATUS REPORT

## 2024-06-17 LAB — MICROALBUMIN / CREATININE URINE RATIO
Creatinine, Urine: 168.3 mg/dL
Microalb/Creat Ratio: 33 mg/g{creat} — ABNORMAL HIGH (ref 0–29)
Microalbumin, Urine: 54.8 ug/mL

## 2024-06-19 ENCOUNTER — Ambulatory Visit: Payer: Self-pay | Admitting: Family Medicine

## 2024-06-19 NOTE — Progress Notes (Signed)
 HI Pa[place]ang, you do have a little extra protein in your urine. I want to keep an eye on that and recheck in 3-4 months.

## 2024-06-22 ENCOUNTER — Other Ambulatory Visit: Payer: Self-pay

## 2024-06-22 DIAGNOSIS — C50411 Malignant neoplasm of upper-outer quadrant of right female breast: Secondary | ICD-10-CM

## 2024-06-23 ENCOUNTER — Inpatient Hospital Stay (HOSPITAL_BASED_OUTPATIENT_CLINIC_OR_DEPARTMENT_OTHER): Admitting: Adult Health

## 2024-06-23 ENCOUNTER — Encounter: Payer: Self-pay | Admitting: Adult Health

## 2024-06-23 ENCOUNTER — Inpatient Hospital Stay

## 2024-06-23 VITALS — BP 158/90 | HR 95 | Temp 98.0°F | Resp 18 | Ht 64.0 in | Wt 177.0 lb

## 2024-06-23 DIAGNOSIS — C50411 Malignant neoplasm of upper-outer quadrant of right female breast: Secondary | ICD-10-CM

## 2024-06-23 DIAGNOSIS — Z170 Estrogen receptor positive status [ER+]: Secondary | ICD-10-CM | POA: Diagnosis not present

## 2024-06-23 DIAGNOSIS — N6310 Unspecified lump in the right breast, unspecified quadrant: Secondary | ICD-10-CM | POA: Diagnosis not present

## 2024-06-23 DIAGNOSIS — Z882 Allergy status to sulfonamides status: Secondary | ICD-10-CM | POA: Diagnosis not present

## 2024-06-23 DIAGNOSIS — Z79899 Other long term (current) drug therapy: Secondary | ICD-10-CM | POA: Diagnosis not present

## 2024-06-23 DIAGNOSIS — Z79811 Long term (current) use of aromatase inhibitors: Secondary | ICD-10-CM | POA: Diagnosis not present

## 2024-06-23 DIAGNOSIS — Z7901 Long term (current) use of anticoagulants: Secondary | ICD-10-CM | POA: Diagnosis not present

## 2024-06-23 DIAGNOSIS — Z1721 Progesterone receptor positive status: Secondary | ICD-10-CM | POA: Diagnosis not present

## 2024-06-23 DIAGNOSIS — Z1732 Human epidermal growth factor receptor 2 negative status: Secondary | ICD-10-CM | POA: Diagnosis not present

## 2024-06-23 LAB — CBC WITH DIFFERENTIAL (CANCER CENTER ONLY)
Abs Immature Granulocytes: 0.02 K/uL (ref 0.00–0.07)
Basophils Absolute: 0.1 K/uL (ref 0.0–0.1)
Basophils Relative: 1 %
Eosinophils Absolute: 0.5 K/uL (ref 0.0–0.5)
Eosinophils Relative: 6 %
HCT: 35.6 % — ABNORMAL LOW (ref 36.0–46.0)
Hemoglobin: 12.4 g/dL (ref 12.0–15.0)
Immature Granulocytes: 0 %
Lymphocytes Relative: 24 %
Lymphs Abs: 1.8 K/uL (ref 0.7–4.0)
MCH: 29.7 pg (ref 26.0–34.0)
MCHC: 34.8 g/dL (ref 30.0–36.0)
MCV: 85.2 fL (ref 80.0–100.0)
Monocytes Absolute: 0.8 K/uL (ref 0.1–1.0)
Monocytes Relative: 10 %
Neutro Abs: 4.5 K/uL (ref 1.7–7.7)
Neutrophils Relative %: 59 %
Platelet Count: 314 K/uL (ref 150–400)
RBC: 4.18 MIL/uL (ref 3.87–5.11)
RDW: 13.7 % (ref 11.5–15.5)
WBC Count: 7.7 K/uL (ref 4.0–10.5)
nRBC: 0 % (ref 0.0–0.2)

## 2024-06-23 LAB — CMP (CANCER CENTER ONLY)
ALT: 10 U/L (ref 0–44)
AST: 15 U/L (ref 15–41)
Albumin: 4.2 g/dL (ref 3.5–5.0)
Alkaline Phosphatase: 80 U/L (ref 38–126)
Anion gap: 7 (ref 5–15)
BUN: 12 mg/dL (ref 8–23)
CO2: 27 mmol/L (ref 22–32)
Calcium: 10.2 mg/dL (ref 8.9–10.3)
Chloride: 107 mmol/L (ref 98–111)
Creatinine: 0.96 mg/dL (ref 0.44–1.00)
GFR, Estimated: 60 mL/min — ABNORMAL LOW (ref >60)
Glucose, Bld: 97 mg/dL (ref 70–99)
Potassium: 3.8 mmol/L (ref 3.5–5.1)
Sodium: 141 mmol/L (ref 135–145)
Total Bilirubin: 0.4 mg/dL (ref 0.0–1.2)
Total Protein: 7 g/dL (ref 6.5–8.1)

## 2024-06-23 NOTE — Progress Notes (Signed)
 SURVIVORSHIP VISIT:  BRIEF ONCOLOGIC HISTORY:  Oncology History  Malignant neoplasm of upper-outer quadrant of right breast in female, estrogen receptor positive (HCC)  12/03/2023 Initial Diagnosis   Screening mammogram detected right breast mass at 10 o'clock position measuring 0.8 cm, axilla negative, ultrasound-guided biopsy revealed grade 2 IDC with DCIS ER 95%, PR 20%, HER2 negative, Ki-67 10%   12/11/2023 Cancer Staging   Staging form: Breast, AJCC 8th Edition - Clinical stage from 12/11/2023: Stage IA (cT1b, cN0, cM0, G2, ER+, PR+, HER2-) - Signed by Lanell Donald Stagger, PA-C on 12/11/2023 Stage prefix: Initial diagnosis Method of lymph node assessment: Clinical Histologic grading system: 3 grade system   12/20/2023 Genetic Testing   Negative [facility] CancerNext-Expanded +RNAinsight Panel.  Report date is 12/20/2023.   The CancerNext-Expanded gene panel offered by [facility] and includes sequencing, rearrangement, and RNA analysis for the following 76 genes: AIP, ALK, APC, ATM, AXIN2, BAP1, BARD1, BMPR1A, BRCA1, BRCA2, BRIP1, CDC73, CDH1, CDK4, CDKN1B, CDKN2A, CEBPA, CHEK2, CTNNA1, DDX41, DICER1, ETV6, FH, FLCN, GATA2, LZTR1, MAX, MBD4, MEN1, MET, MLH1, MSH2, MSH3, MSH6, MUTYH, NF1, NF2, NTHL1, PALB2, PHOX2B, PMS2, POT1, PRKAR1A, PTCH1, PTEN, RAD51C, RAD51D, RB1, RET, RUNX1, SDHA, SDHAF2, SDHB, SDHC, SDHD, SMAD4, SMARCA4, SMARCB1, SMARCE1, STK11, SUFU, TMEM127, TP53, TSC1, TSC2, VHL, and WT1 (sequencing and deletion/duplication); EGFR, HOXB13, KIT, MITF, PDGFRA, POLD1, and POLE (sequencing only); EPCAM and GREM1 (deletion/duplication only).     01/01/2024 Surgery   Right lumpectomy: Grade 2 IDC 1 cm, intermediate grade DCIS, margins negative, LVI not identified, ER 95%, PR 20%, HER2 1+ negative, Ki67 10%   02/04/2024 - 03/03/2024 Radiation Therapy   Plan Name: Breast_R Site: Breast, Right Technique: 3D Mode: Photon Dose Per Fraction: 5.7 Gy Prescribed Dose (Delivered / Prescribed): 28.5 Gy  / 28.5 Gy Prescribed Fxs (Delivered / Prescribed): 5 / 5   06/2024 -  Anti-estrogen oral therapy   Anastrozole    06/23/2024 Cancer Staging   Staging form: Breast, AJCC 8th Edition - Pathologic: Stage IA (pT1b, pN0, cM0, G2, ER+, PR+, HER2-) - Signed by Crawford Morna Pickle, NP on 06/23/2024 Histologic grading system: 3 grade system     INTERVAL HISTORY:  Ms. Frances to review her survivorship care plan detailing her treatment course for breast cancer, as well as monitoring long-term side effects of that treatment, education regarding health maintenance, screening, and overall wellness and health promotion.     Overall, Ms. Shultis reports feeling quite well.  She is taking anastrozole  daily with good tolerance.  REVIEW OF SYSTEMS:  Review of Systems  Constitutional:  Negative for appetite change, chills, fatigue, fever and unexpected weight change.  HENT:   Negative for hearing loss, lump/mass and trouble swallowing.   Eyes:  Negative for eye problems and icterus.  Respiratory:  Negative for chest tightness, cough and shortness of breath.   Cardiovascular:  Negative for chest pain, leg swelling and palpitations.  Gastrointestinal:  Negative for abdominal distention, abdominal pain, constipation, diarrhea, nausea and vomiting.  Endocrine: Negative for hot flashes.  Genitourinary:  Negative for difficulty urinating.   Musculoskeletal:  Negative for arthralgias.  Skin:  Negative for itching and rash.  Neurological:  Negative for dizziness, extremity weakness, headaches and numbness.  Hematological:  Negative for adenopathy. Does not bruise/bleed easily.  Psychiatric/Behavioral:  Negative for depression. The patient is not nervous/anxious.    Breast: Denies any new nodularity, masses, tenderness, nipple changes, or nipple discharge.       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   Otomycosis of right ear 09/27/2017   Paroxysmal atrial fibrillation (HCC)    Pneumonia    11/2023   Thyroid  disease    Trochanteric bursitis of right hip 05/29/2017   Past Surgical History:  Procedure Laterality Date   ANGIOPLASTY     BREAST BIOPSY Right 12/03/2023   US  RT BREAST BX W LOC DEV 1ST LESION IMG BX SPEC US  GUIDE 12/03/2023 [facility]   BREAST BIOPSY  12/31/2023   MM RT RADIOACTIVE SEED LOC MAMMO GUIDE 12/31/2023 [facility]   BREAST LUMPECTOMY WITH RADIOACTIVE SEED LOCALIZATION Right 01/01/2024   Procedure: RIGHT BREAST LUMPECTOMY WITH RADIOACTIVE SEED LOCALIZATION;  Surgeon: Ebbie Cough, MD;  Location: [facility];  Service: General;  Laterality: Right;   CARDIAC CATHETERIZATION     CATARACT EXTRACTION Bilateral 2010   COLONOSCOPY W/ POLYPECTOMY  01/04/2015   CORONARY STENT INTERVENTION N/A 08/31/2022   Procedure: CORONARY STENT INTERVENTION;  Surgeon: Wonda Sharper, MD;  Location: [facility] INVASIVE CV LAB;  Service: Cardiovascular;  Laterality: N/A;   HEMORRHOID BANDING  2015   LEFT HEART CATH AND CORONARY ANGIOGRAPHY N/A 04/26/2021   Procedure: LEFT HEART CATH AND CORONARY ANGIOGRAPHY;  Surgeon: Wonda Sharper, MD;  Location: [facility] INVASIVE CV LAB;  Service: Cardiovascular;  Laterality: N/A;   LEFT HEART CATH AND CORONARY ANGIOGRAPHY N/A 09/14/2022   Procedure: LEFT HEART CATH AND CORONARY ANGIOGRAPHY;  Surgeon: Wonda Sharper, MD;  Location: [facility] INVASIVE CV LAB;  Service: Cardiovascular;  Laterality: N/A;     ALLERGIES:  Allergies  Allergen Reactions   Sulfamethoxazole Hives and Rash   Sertraline  Nausea And Vomiting   Sulfa Antibiotics Hives  and Rash   Tape Rash    Vinyl;tape     CURRENT MEDICATIONS:  Outpatient Encounter Medications as of 06/23/2024  Medication Sig   acetic acid  2 % otic solution Place 6 drops into both ears 4 (four) times daily. PRN   albuterol  (VENTOLIN  HFA) 108 (90 Base) MCG/ACT inhaler Inhale 2 puffs into the lungs every 6 (six) hours as needed for wheezing.   allopurinol  (ZYLOPRIM ) 300 MG tablet Take 300 mg by mouth 2 (two) times daily.   anastrozole  (ARIMIDEX ) 1 MG tablet Take 1 tablet (1 mg total) by mouth daily.   apixaban  (ELIQUIS ) 5 MG TABS tablet Take 1 tablet (5 mg total) by mouth 2 (two) times daily.   atenolol  (TENORMIN ) 100 MG tablet Take 1 tablet (100 mg total) by mouth daily.   atorvastatin  (LIPITOR ) 80 MG tablet Take 1 tablet (80 mg total) by mouth at bedtime.   BREZTRI  AEROSPHERE 160-9-4.8 MCG/ACT AERO inhaler Inhale 2 puffs into the lungs 2 (two) times daily.   cyanocobalamin  (VITAMIN B12) 1000 MCG tablet Take 1,000 mcg by mouth daily.   empagliflozin  (JARDIANCE ) 10 MG TABS tablet Take 1 tablet (10 mg total) by mouth daily before breakfast.   EPINEPHrine  0.3 mg/0.3 mL IJ SOAJ injection Inject 0.3 mg into the muscle as needed for anaphylaxis. Call 9-1-1 after use.   Evolocumab  (  REPATHA  SURECLICK) 140 MG/ML SOAJ Inject 140 mg into the skin every 14 (fourteen) days.   ezetimibe  (ZETIA ) 10 MG tablet Take 1 tablet (10 mg total) by mouth daily.   glucose blood test strip To be used twice daily for testing blood sugars. E11.65   ipratropium (ATROVENT ) 0.03 % nasal spray Place 2 sprays into both nostrils every 12 (twelve) hours.   ipratropium-albuterol  (DUONEB) 0.5-2.5 (3) MG/3ML SOLN Take 3 mLs by nebulization every 6 (six) hours as needed.   irbesartan  (AVAPRO ) 300 MG tablet Take 150 mg by mouth every evening.   isosorbide  mononitrate (IMDUR ) 120 MG 24 hr tablet Take 60-120 mg by mouth See admin instructions. Take 60 mg in the morning and 120 mg in the evening   levothyroxine  (SYNTHROID ) 125 MCG  tablet Take 1 tablet (125 mcg total) by mouth daily before breakfast.   Mepolizumab  ([name] ) 100 MG/ML SOAJ Inject 1 mL (100 mg total) into the skin every 28 (twenty-eight) days.   nitroGLYCERIN  (NITROSTAT ) 0.4 MG SL tablet Place 1 tablet (0.4 mg total) under the tongue every 5 (five) minutes as needed for chest pain (or tightness).   Omega-3 Fatty Acids (FISH OIL ) 1000 MG CAPS Take 1 capsule (1,000 mg total) by mouth daily.   ranolazine  (RANEXA ) 500 MG 12 hr tablet Take 1 tablet (500 mg total) by mouth 2 (two) times daily.   tirzepatide  (MOUNJARO ) 7.5 MG/0.5ML Pen Inject 7.5 mg into the skin once a week.   Vitamin D , Cholecalciferol , 25 MCG (1000 UT) TABS Take 25 mcg by mouth daily in the afternoon.   zinc  gluconate 50 MG tablet Take 50 mg by mouth at bedtime.   No facility-administered encounter medications on file as of 06/23/2024.     ONCOLOGIC FAMILY HISTORY:  Family History  Problem Relation Age of Onset   Hypertension Mother    Hyperlipidemia Mother    Alzheimer's disease Mother    Vascular Disease Mother    Glaucoma Father    Heart disease Father    Hypertension Father    Diabetes Father    Hyperlipidemia Father    Skin cancer Father        SCC; dx 52s-80s   Diabetes Paternal Grandmother    Colon cancer Neg Hx    Esophageal cancer Neg Hx      SOCIAL HISTORY:  Social History   Socioeconomic History   Marital status: Divorced    Spouse name: Not on file   Number of children: 2   Years of education: 13.5   Highest education level: Some college, no degree  Occupational History    Comment: retired  Tobacco Use   Smoking status: Never   Smokeless tobacco: Never  Vaping Use   Vaping status: Never Used  Substance and Sexual Activity   Alcohol use: Not Currently    Comment: Rare   Drug use: No   Sexual activity: Not Currently  Other Topics Concern   Not on file  Social History Narrative   Lives alone. Likes to crafts and paint in her free time.      Right  Handed    Lives in a one story home    Social Drivers of Health   Financial Resource Strain: Low Risk  (06/15/2024)   Overall Financial Resource Strain (CARDIA)    Difficulty of Paying Living Expenses: Not very hard  Food Insecurity: No Food Insecurity (06/15/2024)   Hunger Vital Sign    Worried About Running Out of Food in the Last Year:  Never true    Ran Out of Food in the Last Year: Never true  Transportation Needs: No Transportation Needs (06/15/2024)   PRAPARE - Administrator, Civil Service (Medical): No    Lack of Transportation (Non-Medical): No  Physical Activity: Insufficiently Active (06/15/2024)   Exercise Vital Sign    Days of Exercise per Week: 2 days    Minutes of Exercise per Session: 20 min  Stress: No Stress Concern Present (06/15/2024)   [employer] of Occupational Health - Occupational Stress Questionnaire    Feeling of Stress: Only a little  Social Connections: Moderately Isolated (06/15/2024)   Social Connection and Isolation Panel    Frequency of Communication with Friends and Family: More than three times a week    Frequency of Social Gatherings with Friends and Family: More than three times a week    Attends Religious Services: 1 to 4 times per year    Active Member of [employer] or Organizations: No    Attends Engineer, structural: Not on file    Marital Status: Divorced  Intimate Partner Violence: Not At Risk (01/28/2024)   Humiliation, Afraid, Rape, and Kick questionnaire    Fear of Current or Ex-Partner: No    Emotionally Abused: No    Physically Abused: No    Sexually Abused: No     OBSERVATIONS/OBJECTIVE:  BP (!) 158/90 (BP Location: Right Arm, Patient Position: Sitting) Comment: 158/80  Pulse 95   Temp 98 F (36.7 C) (Tympanic)   Resp 18   Ht 5' 4 (1.626 m)   Wt 177 lb (80.3 kg)   BMI 30.38 kg/m  GENERAL: Patient is a well appearing female in no acute distress HEENT:  Sclerae anicteric.  Oropharynx clear and moist. No  ulcerations or evidence of oropharyngeal candidiasis. Neck is supple.  NODES:  No cervical, supraclavicular, or axillary lymphadenopathy palpated.  BREAST EXAM: right breast s/p lumpectomy and radiation, no sign of local recurrence, left breast is benign LUNGS:  Clear to auscultation bilaterally.  No wheezes or rhonchi. HEART:  Regular rate and rhythm. No murmur appreciated. ABDOMEN:  Soft, nontender.  Positive, normoactive bowel sounds. No organomegaly palpated. MSK:  No focal spinal tenderness to palpation. Full range of motion bilaterally in the upper extremities. EXTREMITIES:  No peripheral edema.   SKIN:  Clear with no obvious rashes or skin changes. No nail dyscrasia. NEURO:  Nonfocal. Well oriented.  Appropriate affect.   LABORATORY DATA:  None for this visit.  DIAGNOSTIC IMAGING:  None for this visit.      ASSESSMENT AND PLAN:  Ms.. Coxe is a pleasant 78 y.o. female with Stage IA right breast invasive ductal carcinoma, ER+/PR+/HER2-, diagnosed in 11/2023, treated with lumpectomy, adjuvant radiation therapy, and anti-estrogen therapy with Anastrozole  beginning in 06/2024.  She presents to the Survivorship Clinic for our initial meeting and routine follow-up post-completion of treatment for breast cancer.    1. Stage IA right breast cancer:  Ms. Coble is continuing to recover from definitive treatment for breast cancer. She will follow-up with her medical oncologist, Dr. Odean in 12 months with history and physical exam per surveillance protocol.  She will continue her anti-estrogen therapy with Anastrozole . Thus far, she is tolerating the Anastrozole  well, with minimal side effects. Her mammogram is due 11/2024; orders placed today. Today, a comprehensive survivorship care plan and treatment summary was reviewed with the patient today detailing her breast cancer diagnosis, treatment course, potential late/long-term effects of treatment, appropriate follow-up care with  recommendations for the future, and patient education resources.  A copy of this summary, along with a letter will be sent to the patient's primary care provider via mail/fax/In Basket message after today's visit.    2. Bone health:  Given Ms. Kuklinski's age/history of breast cancer and her current treatment regimen including anti-estrogen therapy with Anastrozole , she is at risk for bone demineralization.  Her last DEXA scan was 09/20/2021 and was normal.  She was given education on specific activities to promote bone health.  3. Cancer screening:  Due to Ms. Pieczynski's history and her age, she should receive screening for skin cancers, colon cancer, and gynecologic cancers.  The information and recommendations are listed on the patient's comprehensive care plan/treatment summary and were reviewed in detail with the patient.    4. Health maintenance and wellness promotion: Ms. Marchio was encouraged to consume 5-7 servings of fruits and vegetables per day. We reviewed the Nutrition Rainbow handout.  She was also encouraged to engage in moderate to vigorous exercise for 30 minutes per day most days of the week.  She was instructed to limit her alcohol consumption and continue to abstain from tobacco use.     5. Support services/counseling: It is not uncommon for this period of the patient's cancer care trajectory to be one of many emotions and stressors.   She was given information regarding our available services and encouraged to contact me with any questions or for help enrolling in any of our support group/programs.    Follow up instructions:    -Return to cancer center in 1 year for f/u with Dr. Odean  -Mammogram due in 11/2024 -She is welcome to return back to the Survivorship Clinic at any time; no additional follow-up needed at this time.  -Consider referral back to survivorship as a long-term survivor for continued surveillance  The patient was provided an opportunity to ask questions and  all were answered. The patient agreed with the plan and demonstrated an understanding of the instructions.   Total encounter time:40 minutes*in face-to-face visit time, chart review, lab review, care coordination, order entry, and documentation of the encounter time.    Morna Kendall, NP 06/23/24 1:44 PM Medical Oncology and Hematology [facility] [address] [place], [place] [address] Tel. [phone]    Fax. [phone]  *Total Encounter Time as defined by the Centers for Medicare and Medicaid Services includes, in addition to the face-to-face time of a patient visit (documented in the note above) non-face-to-face time: obtaining and reviewing outside history, ordering and reviewing medications, tests or procedures, care coordination (communications with other health care professionals or caregivers) and documentation in the medical record.

## 2024-06-25 ENCOUNTER — Telehealth: Payer: Self-pay

## 2024-06-25 NOTE — Telephone Encounter (Signed)
 Copied from CRM [id number]. Topic: Clinical - Prescription Issue >> Jun 25, 2024 12:38 PM Russell PARAS wrote: Reason for CRM:   Adina, with [employer] Patient Assistance, is contacting clinic to speak with [facility] or [facility]. She is advising the pt was approved for PA for [name] .   She is needing to go over some additional information with pharmacy team. Requested call back  CB#  [phone], follow Patient Assistance prompts  Avail 8 AM - 8 PM Monday through Friday  Please advise!

## 2024-06-26 NOTE — Telephone Encounter (Signed)
 Returned call to [employer], pt HAS been approved for PAP until 12/02/2024. Rx has been triaged to pharmacy and pt has already been notified. Apparently Matt's initial was to inquire if we had any additional questions about the process. Advised that we did not. Nothing further needed.

## 2024-06-26 NOTE — Telephone Encounter (Signed)
 Received a fax from  [name]  regarding an approval for [name]  patient assistance from 06/25/2024 to 12/02/2024. Approval letter has not been obtained, but will be sent to scan center if/when it is received.   Phone #: [phone] Fax #: [phone]

## 2024-07-06 ENCOUNTER — Encounter: Payer: Self-pay | Admitting: Pulmonary Disease

## 2024-07-08 NOTE — Telephone Encounter (Signed)
**Note De-identified  [place] Obfuscation** Please advise 

## 2024-07-09 DIAGNOSIS — E119 Type 2 diabetes mellitus without complications: Secondary | ICD-10-CM | POA: Diagnosis not present

## 2024-07-09 DIAGNOSIS — H40013 Open angle with borderline findings, low risk, bilateral: Secondary | ICD-10-CM | POA: Diagnosis not present

## 2024-07-09 DIAGNOSIS — H02403 Unspecified ptosis of bilateral eyelids: Secondary | ICD-10-CM | POA: Diagnosis not present

## 2024-07-09 DIAGNOSIS — H526 Other disorders of refraction: Secondary | ICD-10-CM | POA: Diagnosis not present

## 2024-07-09 DIAGNOSIS — H18453 Nodular corneal degeneration, bilateral: Secondary | ICD-10-CM | POA: Diagnosis not present

## 2024-07-09 DIAGNOSIS — H43393 Other vitreous opacities, bilateral: Secondary | ICD-10-CM | POA: Diagnosis not present

## 2024-07-09 LAB — OPHTHALMOLOGY REPORT-SCANNED

## 2024-07-15 ENCOUNTER — Encounter (HOSPITAL_BASED_OUTPATIENT_CLINIC_OR_DEPARTMENT_OTHER): Payer: Self-pay | Admitting: Emergency Medicine

## 2024-07-15 ENCOUNTER — Emergency Department (HOSPITAL_BASED_OUTPATIENT_CLINIC_OR_DEPARTMENT_OTHER): Admission: EM | Admit: 2024-07-15 | Discharge: 2024-07-16 | Disposition: A | Discharge status: Home / Self Care

## 2024-07-15 ENCOUNTER — Emergency Department (HOSPITAL_BASED_OUTPATIENT_CLINIC_OR_DEPARTMENT_OTHER)

## 2024-07-15 ENCOUNTER — Other Ambulatory Visit: Payer: Self-pay

## 2024-07-15 DIAGNOSIS — Z79890 Hormone replacement therapy (postmenopausal): Secondary | ICD-10-CM | POA: Insufficient documentation

## 2024-07-15 DIAGNOSIS — E039 Hypothyroidism, unspecified: Secondary | ICD-10-CM | POA: Insufficient documentation

## 2024-07-15 DIAGNOSIS — R0789 Other chest pain: Secondary | ICD-10-CM | POA: Diagnosis not present

## 2024-07-15 DIAGNOSIS — R0602 Shortness of breath: Secondary | ICD-10-CM | POA: Diagnosis not present

## 2024-07-15 DIAGNOSIS — J4521 Mild intermittent asthma with (acute) exacerbation: Secondary | ICD-10-CM

## 2024-07-15 DIAGNOSIS — K449 Diaphragmatic hernia without obstruction or gangrene: Secondary | ICD-10-CM | POA: Diagnosis not present

## 2024-07-15 DIAGNOSIS — I2510 Atherosclerotic heart disease of native coronary artery without angina pectoris: Secondary | ICD-10-CM | POA: Insufficient documentation

## 2024-07-15 DIAGNOSIS — E1165 Type 2 diabetes mellitus with hyperglycemia: Secondary | ICD-10-CM | POA: Diagnosis not present

## 2024-07-15 DIAGNOSIS — I517 Cardiomegaly: Secondary | ICD-10-CM | POA: Diagnosis not present

## 2024-07-15 DIAGNOSIS — I10 Essential (primary) hypertension: Secondary | ICD-10-CM | POA: Insufficient documentation

## 2024-07-15 DIAGNOSIS — Z79899 Other long term (current) drug therapy: Secondary | ICD-10-CM | POA: Insufficient documentation

## 2024-07-15 DIAGNOSIS — R079 Chest pain, unspecified: Secondary | ICD-10-CM | POA: Diagnosis not present

## 2024-07-15 DIAGNOSIS — Z7901 Long term (current) use of anticoagulants: Secondary | ICD-10-CM | POA: Diagnosis not present

## 2024-07-15 LAB — BASIC METABOLIC PANEL WITH GFR
Anion gap: 13 (ref 5–15)
BUN: 15 mg/dL (ref 8–23)
CO2: 23 mmol/L (ref 22–32)
Calcium: 9.9 mg/dL (ref 8.9–10.3)
Chloride: 103 mmol/L (ref 98–111)
Creatinine, Ser: 0.96 mg/dL (ref 0.44–1.00)
GFR, Estimated: 60 mL/min — ABNORMAL LOW (ref >60)
Glucose, Bld: 113 mg/dL — ABNORMAL HIGH (ref 70–99)
Potassium: 3.7 mmol/L (ref 3.5–5.1)
Sodium: 139 mmol/L (ref 135–145)

## 2024-07-15 LAB — CBC
HCT: 33.8 % — ABNORMAL LOW (ref 36.0–46.0)
Hemoglobin: 11.7 g/dL — ABNORMAL LOW (ref 12.0–15.0)
MCH: 29.8 pg (ref 26.0–34.0)
MCHC: 34.6 g/dL (ref 30.0–36.0)
MCV: 86.2 fL (ref 80.0–100.0)
Platelets: 297 K/uL (ref 150–400)
RBC: 3.92 MIL/uL (ref 3.87–5.11)
RDW: 13.7 % (ref 11.5–15.5)
WBC: 7 K/uL (ref 4.0–10.5)
nRBC: 0 % (ref 0.0–0.2)

## 2024-07-15 LAB — TROPONIN T, HIGH SENSITIVITY
Troponin T High Sensitivity: <15 ng/L (ref 0–19)
Troponin T High Sensitivity: <15 ng/L (ref 0–19)

## 2024-07-15 MED ORDER — PREDNISONE 20 MG PO TABS: 40.0000 mg | ORAL_TABLET | Freq: Every day | ORAL | 0 refills | Status: DC

## 2024-07-15 MED ORDER — AZITHROMYCIN 250 MG PO TABS: 500.0000 mg | ORAL_TABLET | Freq: Every day | ORAL | 0 refills | Status: DC

## 2024-07-15 MED ORDER — METHYLPREDNISOLONE SODIUM SUCC 125 MG IJ SOLR
125.0000 mg | Freq: Once | INTRAMUSCULAR | Status: AC
  Administered 2024-07-15 (×2): 125 mg via INTRAVENOUS
  Filled 2024-07-15: qty 2

## 2024-07-15 MED ORDER — AZITHROMYCIN 250 MG PO TABS
500.0000 mg | ORAL_TABLET | Freq: Once | ORAL | Status: AC
  Administered 2024-07-15 (×2): 500 mg via ORAL
  Filled 2024-07-15: qty 2

## 2024-07-15 MED ORDER — IOHEXOL 350 MG/ML SOLN
75.0000 mL | Freq: Once | INTRAVENOUS | Status: AC | PRN
  Administered 2024-07-15 (×2): 75 mL via INTRAVENOUS

## 2024-07-15 NOTE — ED Notes (Signed)
 Patient transported to CT

## 2024-07-15 NOTE — ED Provider Notes (Signed)
 [place] [facility] AT [facility] Provider Note   CSN: [id number] Arrival date & time: 07/15/24  1948     Patient presents with: Chest Pain and Shortness of Breath   Tara Campbell is a 78 y.o. female with PMHx CAD, asthma, DM, HLD, HTN, hypothyroidism, paroxysmal afib who presents to ED concerned for intermittent episodes of chest tightness and SOB. Patient with wasp stings on Sunday which have since resolved but does get more erythematous when patient itches this area. Chest tightness and SOB started yesterday while patient was resting and watching TV. Patient has been using nebulizing treatments which helps for short term before symptoms start up again. Mild cough as well.   Denies fever, nausea, vomiting, diarrhea.      Chest Pain Associated symptoms: shortness of breath   Shortness of Breath Associated symptoms: chest pain        Prior to Admission medications   Medication Sig Start Date End Date Taking? Authorizing Provider  azithromycin  (ZITHROMAX ) 250 MG tablet Take 2 tablets (500 mg total) by mouth daily for 2 days. Take first 2 tablets together, then 1 every day until finished. 07/15/24 07/17/24 Yes Hoy Fraction F, PA-C  predniSONE  (DELTASONE ) 20 MG tablet Take 2 tablets (40 mg total) by mouth daily. 07/15/24  Yes Hoy Fraction F, PA-C  acetic acid  2 % otic solution Place 6 drops into both ears 4 (four) times daily. PRN 06/16/24   Alvan Dorothyann BIRCH, MD  albuterol  (VENTOLIN  HFA) [id number] Base) MCG/ACT inhaler Inhale 2 puffs into the lungs every 6 (six) hours as needed for wheezing. 04/30/23   Mannam, Praveen, MD  allopurinol  (ZYLOPRIM ) 300 MG tablet Take 300 mg by mouth 2 (two) times daily.    [provider]  anastrozole  (ARIMIDEX ) 1 MG tablet Take 1 tablet (1 mg total) by mouth daily. 03/03/24   Odean Potts, MD  apixaban  (ELIQUIS ) 5 MG TABS tablet Take 1 tablet (5 mg total) by mouth 2 (two) times daily. 03/05/23   Alvan Dorothyann BIRCH, MD  atenolol  (TENORMIN ) 100 MG tablet Take 1 tablet (100 mg total) by mouth daily. 12/31/23   Alvan Dorothyann BIRCH, MD  atorvastatin  (LIPITOR ) 80 MG tablet Take 1 tablet (80 mg total) by mouth at bedtime. 04/26/22   Curtis Debby PARAS, MD  BREZTRI  AEROSPHERE 160-9-4.8 MCG/ACT AERO inhaler Inhale 2 puffs into the lungs 2 (two) times daily. 03/24/24   [provider]  cyanocobalamin  (VITAMIN B12) 1000 MCG tablet Take 1,000 mcg by mouth daily.    [provider]  empagliflozin  (JARDIANCE ) 10 MG TABS tablet Take 1 tablet (10 mg total) by mouth daily before breakfast. 09/10/23   Alvan Dorothyann BIRCH, MD  EPINEPHrine  0.3 mg/0.3 mL IJ SOAJ injection Inject 0.3 mg into the muscle as needed for anaphylaxis. Call 9-1-1 after use. 05/02/22   Parrett, Madelin RAMAN, NP  Evolocumab  (REPATHA  SURECLICK) 140 MG/ML SOAJ Inject 140 mg into the skin every 14 (fourteen) days. 12/24/23   Rolan Ezra RAMAN, MD  ezetimibe  (ZETIA ) 10 MG tablet Take 1 tablet (10 mg total) by mouth daily. 03/05/23   Alvan Dorothyann BIRCH, MD  glucose blood test strip To be used twice daily for testing blood sugars. E11.65 04/03/21   Metheney, Catherine D, MD  ipratropium (ATROVENT ) 0.03 % nasal spray Place 2 sprays into both nostrils every 12 (twelve) hours. 11/23/22   Crain, Whitney L, PA  ipratropium-albuterol  (DUONEB) 0.5-2.5 (3) MG/3ML SOLN Take 3 mLs by nebulization every 6 (six) hours  as needed. 07/25/23   Mannam, Praveen, MD  irbesartan  (AVAPRO ) 300 MG tablet Take 150 mg by mouth every evening. 09/15/22   [provider]  isosorbide  mononitrate (IMDUR ) 120 MG 24 hr tablet Take 60-120 mg by mouth See admin instructions. Take 60 mg in the morning and 120 mg in the evening    [provider]  levothyroxine  (SYNTHROID ) 125 MCG tablet Take 1 tablet (125 mcg total) by mouth daily before breakfast. 06/04/23   Alvan Dorothyann BIRCH, MD  Mepolizumab  ([name] ) 100 MG/ML SOAJ Inject 1 mL (100 mg total) into the  skin every 28 (twenty-eight) days. 12/18/21   Mannam, Praveen, MD  nitroGLYCERIN  (NITROSTAT ) 0.4 MG SL tablet Place 1 tablet (0.4 mg total) under the tongue every 5 (five) minutes as needed for chest pain (or tightness). 01/09/24   Alvan Dorothyann BIRCH, MD  Omega-3 Fatty Acids (FISH OIL ) 1000 MG CAPS Take 1 capsule (1,000 mg total) by mouth daily. 04/26/22   Curtis Debby PARAS, MD  ranolazine  (RANEXA ) 500 MG 12 hr tablet Take 1 tablet (500 mg total) by mouth 2 (two) times daily. 09/21/22   Hayes Beckey CROME, NP  tirzepatide  (MOUNJARO ) 7.5 MG/0.5ML Pen Inject 7.5 mg into the skin once a week. 03/17/24   Alvan Dorothyann BIRCH, MD  Vitamin D , Cholecalciferol , 25 MCG (1000 UT) TABS Take 25 mcg by mouth daily in the afternoon. 12/27/21   [place], Camellia PARAS, DO  zinc  gluconate 50 MG tablet Take 50 mg by mouth at bedtime.    [provider]    Allergies: Sulfamethoxazole, Sertraline , Sulfa antibiotics, and Tape    Review of Systems  Respiratory:  Positive for shortness of breath.   Cardiovascular:  Positive for chest pain.    Updated Vital Signs BP (!) 176/101   Pulse (!) 103   Temp 98.3 F (36.8 C) (Oral)   Resp 20   Ht 5' 4 (1.626 m)   Wt 76.2 kg   SpO2 97%   BMI 28.84 kg/m   Physical Exam Vitals and nursing note reviewed.  Constitutional:      General: She is not in acute distress.    Appearance: She is not ill-appearing or toxic-appearing.  HENT:     Head: Normocephalic and atraumatic.     Mouth/Throat:     Mouth: Mucous membranes are moist.     Pharynx: No oropharyngeal exudate or posterior oropharyngeal erythema.  Eyes:     General: No scleral icterus.       Right eye: No discharge.        Left eye: No discharge.     Conjunctiva/sclera: Conjunctivae normal.  Cardiovascular:     Rate and Rhythm: Normal rate and regular rhythm.     Pulses: Normal pulses.     Heart sounds: Normal heart sounds. No murmur heard. Pulmonary:     Effort: Pulmonary effort is normal. No  respiratory distress.     Breath sounds: Normal breath sounds. No wheezing, rhonchi or rales.  Abdominal:     General: Bowel sounds are normal.     Palpations: Abdomen is soft.     Tenderness: There is no abdominal tenderness.  Musculoskeletal:     Right lower leg: No edema.     Left lower leg: No edema.     Comments: No calf tenderness  Skin:    General: Skin is warm and dry.     Findings: No rash.  Neurological:     General: No focal deficit present.  Mental Status: She is alert and oriented to person, place, and time. Mental status is at baseline.  Psychiatric:        Mood and Affect: Mood normal.        Behavior: Behavior normal.     (all labs ordered are listed, but only abnormal results are displayed) Labs Reviewed  BASIC METABOLIC PANEL WITH GFR - Abnormal; Notable for the following components:      Result Value   Glucose, Bld 113 (*)    GFR, Estimated 60 (*)    All other components within normal limits  CBC - Abnormal; Notable for the following components:   Hemoglobin 11.7 (*)    HCT 33.8 (*)    All other components within normal limits  TROPONIN T, HIGH SENSITIVITY  TROPONIN T, HIGH SENSITIVITY    EKG: EKG Interpretation Date/Time:  Wednesday July 15 2024 19:58:30 EDT Ventricular Rate:  108 PR Interval:  213 QRS Duration:  82 QT Interval:  326 QTC Calculation: 437 R Axis:   53  Text Interpretation: Sinus tachycardia Borderline prolonged PR interval Low voltage, precordial leads Confirmed by Simon Rea [phone]) on 07/15/2024 11:37:30 PM  Radiology: CT Angio Chest PE W/Cm &/Or Wo Cm Result Date: 07/15/2024 CLINICAL DATA:  Chest tightness following several bee stings, initial EN EXAM: CT ANGIOGRAPHY CHEST WITH CONTRAST TECHNIQUE: Multidetector CT imaging of the chest was performed using the standard protocol during bolus administration of intravenous contrast. Multiplanar CT image reconstructions and MIPs were obtained to evaluate the vascular anatomy.  RADIATION DOSE REDUCTION: This exam was performed according to the departmental dose-optimization program which includes automated exposure control, adjustment of the mA and/or kV according to patient size and/or use of iterative reconstruction technique. CONTRAST:  75mL OMNIPAQUE  IOHEXOL  350 MG/ML SOLN COMPARISON:  None Available. FINDINGS: Cardiovascular: Thoracic aorta shows no aneurysmal dilatation. No dissection is noted. Heart is mildly enlarged in size. Coronary calcifications are seen. The pulmonary artery shows a normal branching pattern bilaterally. No filling defect to suggest pulmonary embolism is noted. Mediastinum/Nodes: Thoracic inlet is within normal limits. No hilar or mediastinal adenopathy is noted. The esophagus as visualized is within normal limits. Small hiatal hernia is noted. Lungs/Pleura: Lungs are well aerated bilaterally. Some mosaic attenuation is noted consistent with air trapping. Biapical pleural and parenchymal scarring is noted. No sizable effusion is seen. Upper Abdomen: Visualized upper abdomen shows no acute abnormality aside from previously mentioned hiatal hernia. Musculoskeletal: Degenerative changes of the thoracic spine are noted. No acute rib abnormality is seen. Review of the MIP images confirms the above findings. IMPRESSION: No evidence of pulmonary emboli. Mild changes of air trapping. Small hiatal hernia Electronically Signed   By: Oneil Devonshire M.D.   On: 07/15/2024 22:28   DG Chest 2 View Result Date: 07/15/2024 CLINICAL DATA:  chest pain EXAM: CHEST - 2 VIEW COMPARISON:  October 25, 2023 FINDINGS: No focal airspace consolidation, pleural effusion, or pneumothorax. No cardiomegaly.No acute fracture or destructive lesion. Multilevel degenerative disc disease of the spine. IMPRESSION: No acute cardiopulmonary abnormality. Electronically Signed   By: Rogelia Myers M.D.   On: 07/15/2024 20:42     Procedures   Medications Ordered in the ED  iohexol  (OMNIPAQUE )  350 MG/ML injection 75 mL (75 mLs Intravenous Contrast Given 07/15/24 2121)  methylPREDNISolone  sodium succinate (SOLU-MEDROL ) 125 mg/2 mL injection 125 mg (125 mg Intravenous Given 07/15/24 2308)  azithromycin  (ZITHROMAX ) tablet 500 mg (500 mg Oral Given 07/15/24 2305)  Medical Decision Making Amount and/or Complexity of Data Reviewed Labs: ordered. Radiology: ordered.  Risk Prescription drug management.   This patient presents to the ED for concern of shortness of breath, this involves an extensive number of treatment options, and is a complaint that carries with it a high risk of complications and morbidity.  The differential diagnosis includes Anxiety, Anaphylaxis/Angioedema, Aspirated FB, Arrhythmia, CHF, Asthma, COPD, PNA, COVID/Flu/RSV, STEMI, Tamponade, TPNX, Sepsis   Co morbidities that complicate the patient evaluation  CAD, asthma, DM, HLD, HTN, hypothyroidism, paroxysmal afib    Additional history obtained:  DR. Alvan PCP   Problem List / ED Course / Critical interventions / Medication management  Patient presents to ED concern for intermittent episodes of chest tightness and SOB.  Started yesterday.  Nebulizing treatments help short-term.  Physical exam reassuring.  Patient initially mildly tachycardic which could be due to her multiple nebulizing treatments at home.  During final interview patient's heart rate had decreased to 93 bpm.  Rest of vital signs reassuring. I Ordered, and personally interpreted labs.  CBC without leukocytosis.  There is mild anemia with hemoglobin 11.7.  BMP with mild hyperglycemia 113.  Delta troponin negative. The patient was maintained on a cardiac monitor.  I personally viewed and interpreted the cardiac monitored which showed an underlying rhythm of: Sinus tachycardia I ordered imaging studies including chest xray//CTA chest to assess for process contributing to patient's symptoms. I independently  visualized and interpreted imaging which showed no acute process. I agree with the radiologist interpretation. Shared all results with patient.  Answered all questions.  It appears the patient is suffering from a mild asthma/COPD exacerbation.  I will start patient on short steroid course along with azithromycin .  Patient currently asymptomatic and is requesting to go home.  Patient agrees to follow-up with her pulmonologist in the next week. Staffed with Dr. Simon I have reviewed the patients home medicines and have made adjustments as needed The patient has been appropriately medically screened and/or stabilized in the ED. I have low suspicion for any other emergent medical condition which would require further screening, evaluation or treatment in the ED or require inpatient management. At time of discharge the patient is hemodynamically stable and in no acute distress. I have discussed work-up results and diagnosis with patient and answered all questions. Patient is agreeable with discharge plan. We discussed strict return precautions for returning to the emergency department and they verbalized understanding.     Social Determinants of Health:  geriatric       Final diagnoses:  Mild intermittent asthma with exacerbation    ED Discharge Orders          Ordered    predniSONE  (DELTASONE ) 20 MG tablet  Daily        07/15/24 2338    azithromycin  (ZITHROMAX ) 250 MG tablet  Daily        07/15/24 2338               Hoy Nidia FALCON, PA-C 07/15/24 2340    Simon Lavonia SAILOR, MD 07/18/24 1714

## 2024-07-15 NOTE — Discharge Instructions (Addendum)
 I am glad you are feeling better.  Please follow-up with your primary care provider and your pulmonologist.  Seek emergency care if experiencing any new or worsening symptoms.

## 2024-07-15 NOTE — ED Triage Notes (Incomplete)
 Pt c/o chest tightness x 1 day.  Been taking nebulizing tx today x3, no improvement. Denies n/v/diaphoresis.   Also reports being stung by wasps Sunday on L hand.  Yesterday new swelling and itching.   Currently taking eliquis , reports compliance.

## 2024-07-16 ENCOUNTER — Ambulatory Visit: Payer: Self-pay

## 2024-07-16 ENCOUNTER — Other Ambulatory Visit: Payer: Self-pay | Admitting: Family Medicine

## 2024-07-16 DIAGNOSIS — I480 Paroxysmal atrial fibrillation: Secondary | ICD-10-CM

## 2024-07-16 NOTE — Telephone Encounter (Addendum)
 Attempted to call the patient 3 times with no answer, LVM to call NT back. Routing to clinic.

## 2024-07-16 NOTE — Telephone Encounter (Signed)
 Copied from CRM [id number]. Topic: Clinical - Red Word Triage >> Jul 16, 2024 11:07 AM Celestine FALCON wrote: Red Word that prompted transfer to Nurse Triage: Pt was in the ED yesterday for Mild intermittent asthma with exacerbation. After leaving she said she felt dizziness, extreme short of breath, and a heavy chest pressure. I asked if she was still experiencing these sx today, but she said no however the dizziness come and goes but her concern is high.  Pt stated at the ED they ran several tests including labs, chest ct, EKG, and nothing came back with concerning results.   Pt sees Dr. Theophilus at the [facility] clinic.

## 2024-07-16 NOTE — Telephone Encounter (Signed)
 FYI Only or Action Required?: FYI only for provider.  Patient was last seen in primary care on 06/16/2024 by Alvan Dorothyann BIRCH, MD.  Called Nurse Triage reporting Hypertension.  Symptoms began today.  Interventions attempted: Nothing.  Symptoms are: unchanged.  Triage Disposition: Urgent Home Treatment With Follow-up Call  Patient/caregiver understands and will follow disposition?: Yes   Patient will take her blood pressure medication. Routing to call back in an hour to recheck the patient's symptoms.      Copied from CRM [id number]. Topic: Clinical - Red Word Triage >> Jul 16, 2024 11:07 AM Celestine FALCON wrote: Red Word that prompted transfer to Nurse Triage: Pt was in the ED yesterday for Mild intermittent asthma with exacerbation. After leaving she said she felt dizziness, extreme short of breath, and a heavy chest pressure. I asked if she was still experiencing these sx today, but she said no however the dizziness come and goes but her concern is high.  Pt stated at the ED they ran several tests including labs, chest ct, EKG, and nothing came back with concerning results.   Pt sees Dr. Theophilus at the [facility] clinic.         Reason for Disposition  [1] Systolic BP >= 180 OR Diastolic >= 110 AND [2] missed most recent dose of blood pressure medication  Answer Assessment - Initial Assessment Questions 1. BLOOD PRESSURE: What is your blood pressure? Did you take at least two measurements 5 minutes apart?     174/88 2. ONSET: When did you take your blood pressure?     A few minutes ago  3. HOW: How did you take your blood pressure? (e.g., automatic home BP monitor, visiting nurse)     Automatic BP cuff 4. HISTORY: Do you have a history of high blood pressure?     Yes 5. MEDICINES: Are you taking any medicines for blood pressure? Have you missed any doses recently?     Has not taken today  6. OTHER SYMPTOMS: Do you have any symptoms? (e.g., blurred  vision, chest pain, difficulty breathing, headache, weakness)     Dizziness  Protocols used: Blood Pressure - High-A-AH

## 2024-07-20 ENCOUNTER — Ambulatory Visit (INDEPENDENT_AMBULATORY_CARE_PROVIDER_SITE_OTHER): Admitting: Family Medicine

## 2024-07-20 ENCOUNTER — Encounter: Payer: Self-pay | Admitting: Family Medicine

## 2024-07-20 VITALS — BP 117/57 | HR 88 | Ht 64.0 in | Wt 177.1 lb

## 2024-07-20 DIAGNOSIS — I209 Angina pectoris, unspecified: Secondary | ICD-10-CM

## 2024-07-20 DIAGNOSIS — R4586 Emotional lability: Secondary | ICD-10-CM | POA: Diagnosis not present

## 2024-07-20 DIAGNOSIS — R519 Headache, unspecified: Secondary | ICD-10-CM | POA: Diagnosis not present

## 2024-07-20 DIAGNOSIS — M1A0790 Idiopathic chronic gout, unspecified ankle and foot, without tophus (tophi): Secondary | ICD-10-CM

## 2024-07-20 DIAGNOSIS — S060X0A Concussion without loss of consciousness, initial encounter: Secondary | ICD-10-CM

## 2024-07-20 DIAGNOSIS — E782 Mixed hyperlipidemia: Secondary | ICD-10-CM

## 2024-07-20 MED ORDER — ALLOPURINOL 100 MG PO TABS
100.0000 mg | ORAL_TABLET | Freq: Every day | ORAL | 0 refills | Status: AC
Start: 2024-07-20 — End: ?

## 2024-07-20 MED ORDER — ALPRAZOLAM 0.25 MG PO TABS
0.2500 mg | ORAL_TABLET | Freq: Every day | ORAL | 0 refills | Status: DC | PRN
Start: 2024-07-20 — End: 2024-08-20

## 2024-07-20 MED ORDER — ESCITALOPRAM OXALATE 5 MG PO TABS: 5.0000 mg | ORAL_TABLET | Freq: Every day | ORAL | 1 refills | Status: DC

## 2024-07-20 NOTE — Assessment & Plan Note (Signed)
 On Imdur . Has had fewer episodes of Chest pain

## 2024-07-20 NOTE — Progress Notes (Signed)
 Established Patient Office Visit  Subjective  Patient ID: Tara Campbell, female    DOB: May 26, 1946  Age: 78 y.o. MRN: [id number]  Chief Complaint  Patient presents with   Follow-up    Pt reports that she doesn't feel right she stated that since her fall back in April she really hasn't felt herself. She had a concussion     HPI   Discussed the use of AI scribe software for clinical note transcription with the patient, who gave verbal consent to proceed.  History of Present Illness Tara Campbell is an 78 year old female who presents with emotional changes and forgetfulness following a head injury.  Neuropsychiatric symptoms following head injury - Sustained a frontal head injury in the last week of April, resulting in a mild concussion confirmed by head imaging - Persistent emotional lability, described as being 'very teary, emotional' and tearful 'by the hour sometimes' - Increased forgetfulness and difficulty maintaining focus since the injury - No changes in hearing or vision since the injury - No significant changes in balance, though occasional nausea and pronounced emotionality are present - few more headaches than usual  Mood disturbance - Feels more down than usual without a specific cause - Concerned about attending a family event due to current emotional state - History of adverse reaction to a mood medication resulting in vomiting; unable to recall the medication name  Medication adherence - Had been off medications for months but is now adherent to regimen with assistance from sister Romero  Cutaneous reaction to insect bites - Sustained wasp bites one week ago on dorsum left hand, initially very swollen and puffy - Itching managed effectively with over-the-counter cortisone cream  Cardiorespiratory symptoms - History of chest pressure, previously evaluated in the ER - Treated with prednisone  and an antibiotic despite absence of shortness of breath  or wheezing at the time - Uses a nebulizer three to four times daily, which provides relief for several hours - No fluid retention or swelling beyond usual levels - now on Breztri   Feeling overwhelmed but number of medications.       ROS    Objective:     BP (!) 117/57   Pulse 88   Ht 5' 4 (1.626 m)   Wt 177 lb 1.3 oz (80.3 kg)   SpO2 97%   BMI 30.40 kg/m    Physical Exam Constitutional:      Appearance: Normal appearance.  HENT:     Head: Normocephalic and atraumatic.     Mouth/Throat:     Pharynx: Oropharynx is clear.  Neck:     Thyroid : No thyromegaly.  Cardiovascular:     Rate and Rhythm: Normal rate and regular rhythm.  Pulmonary:     Effort: Pulmonary effort is normal.     Breath sounds: Normal breath sounds.  Musculoskeletal:        General: No swelling.     Cervical back: Neck supple.  Skin:    General: Skin is warm and dry.  Neurological:     Mental Status: She is oriented to person, place, and time.  Psychiatric:        Mood and Affect: Mood normal.        Behavior: Behavior normal.      No results found for any visits on 07/20/24.    The ASCVD Risk score (Arnett DK, et al., 2019) failed to calculate for the following reasons:   The 2019 ASCVD risk score is only valid  for ages 40 to 56   Risk score cannot be calculated because patient has a medical history suggesting prior/existing ASCVD    Assessment & Plan:   Problem List Items Addressed This Visit       Cardiovascular and Mediastinum   Angina pectoris (HCC)   On Imdur . Has had fewer episodes of Chest pain      Relevant Medications   irbesartan -hydrochlorothiazide  (AVALIDE) 300-12.5 MG tablet     Musculoskeletal and Integument   Gout of ankle - Primary   Relevant Medications   allopurinol  (ZYLOPRIM ) 100 MG tablet     Other   Hyperlipidemia   Talks with Cardiology about stopping Zetia  since doing well on Repath.  Due to recheck lipids in about 4 weeks.       Relevant  Medications   irbesartan -hydrochlorothiazide  (AVALIDE) 300-12.5 MG tablet   Other Visit Diagnoses       Mood change       Relevant Medications   escitalopram  (LEXAPRO ) 5 MG tablet   ALPRAZolam  (XANAX ) 0.25 MG tablet     Concussion without loss of consciousness, initial encounter       Relevant Orders   MR Brain W Wo Contrast     Frequent headaches       Relevant Medications   allopurinol  (ZYLOPRIM ) 100 MG tablet   escitalopram  (LEXAPRO ) 5 MG tablet   Other Relevant Orders   MR Brain W Wo Contrast       Assessment and Plan Assessment & Plan Concussion with persistent cognitive and emotional symptoms Persistent cognitive and emotional symptoms post-concussion, indicating possible prolonged recovery. - Order MRI of the brain. - Prescribe low-dose Lexapro  for mood and tearfulness. - Discuss Lexapro  side effects, including slow onset. - Provide Xanax  for acute emotional distress.  Depression Increased tearfulness and low mood, possibly related to concussion or independent mood disorder. - Prescribe low-dose Lexapro . - Monitor mood and tearfulness. - Adjust Lexapro  dose based on response.  Wasp stings with localized skin reaction Localized skin reaction with redness and itching post-wasp stings, previously treated with prednisone . - Continue topical treatment for itching. - Monitor symptoms for another week.  Asthma Asthma with recent chest pressure, improved post-prednisone . - Use nebulizer as needed.  Gout Gout well-controlled with allopurinol , no recent flares. - Reduce allopurinol  to 100 mg. - Monitor for flares. - Reassess allopurinol  need if no flares occur.  We will gets labs indlucing lipids at next OV>    Return in about 4 weeks (around 08/17/2024) for New start medication.    I spent 40 minutes on the day of the encounter to include pre-visit record review, face-to-face time with the patient and post visit ordering of test.   Dorothyann Byars, MD

## 2024-07-20 NOTE — Assessment & Plan Note (Addendum)
 Talks with Cardiology about stopping Zetia  since doing well on Repath.  Due to recheck lipids in about 4 weeks.

## 2024-07-21 NOTE — Telephone Encounter (Signed)
 Patient was scheduled for 09/15/225 but this appointment was cancelled .

## 2024-07-21 NOTE — Telephone Encounter (Signed)
 Patient seen in office with DR. Metheney 08/18/225

## 2024-07-27 ENCOUNTER — Ambulatory Visit

## 2024-07-27 DIAGNOSIS — S060X0S Concussion without loss of consciousness, sequela: Secondary | ICD-10-CM | POA: Diagnosis not present

## 2024-07-27 DIAGNOSIS — G44309 Post-traumatic headache, unspecified, not intractable: Secondary | ICD-10-CM | POA: Diagnosis not present

## 2024-07-27 DIAGNOSIS — R419 Unspecified symptoms and signs involving cognitive functions and awareness: Secondary | ICD-10-CM | POA: Diagnosis not present

## 2024-07-27 DIAGNOSIS — R519 Headache, unspecified: Secondary | ICD-10-CM | POA: Diagnosis not present

## 2024-07-27 DIAGNOSIS — S060X0A Concussion without loss of consciousness, initial encounter: Secondary | ICD-10-CM

## 2024-07-27 MED ORDER — GADOBUTROL 1 MMOL/ML IV SOLN
8.0000 mL | Freq: Once | INTRAVENOUS | Status: AC | PRN
  Administered 2024-07-27: 8 mL via INTRAVENOUS

## 2024-07-30 ENCOUNTER — Other Ambulatory Visit: Payer: Self-pay | Admitting: Pulmonary Disease

## 2024-07-30 DIAGNOSIS — Z79899 Other long term (current) drug therapy: Secondary | ICD-10-CM

## 2024-07-30 DIAGNOSIS — J4551 Severe persistent asthma with (acute) exacerbation: Secondary | ICD-10-CM

## 2024-08-03 ENCOUNTER — Encounter: Payer: Self-pay | Admitting: Family Medicine

## 2024-08-03 ENCOUNTER — Encounter (HOSPITAL_COMMUNITY): Payer: Self-pay | Admitting: Cardiology

## 2024-08-04 ENCOUNTER — Encounter: Payer: Self-pay | Admitting: Sports Medicine

## 2024-08-04 ENCOUNTER — Other Ambulatory Visit: Payer: Self-pay | Admitting: Family Medicine

## 2024-08-04 ENCOUNTER — Ambulatory Visit: Payer: Self-pay | Admitting: Family Medicine

## 2024-08-04 DIAGNOSIS — R4586 Emotional lability: Secondary | ICD-10-CM

## 2024-08-04 DIAGNOSIS — M1A0790 Idiopathic chronic gout, unspecified ankle and foot, without tophus (tophi): Secondary | ICD-10-CM

## 2024-08-04 NOTE — Telephone Encounter (Signed)
 NOTE states to Continue Imdur  30 mg in the morning and 60 mg in the evening.   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 She will continue Eliquis  and can stop Plavix .    Ok to send?

## 2024-08-04 NOTE — Progress Notes (Signed)
 Hi Tara Campbell, MRI of the brain is actually pretty reassuring.  No sign of mass or brain tumor.  Brain volume is normal.  No sign of recent or chronic stroke.  The sinuses look good as well.  So again this just rules out some potential causes and issues.

## 2024-08-10 ENCOUNTER — Telehealth (HOSPITAL_COMMUNITY): Payer: Self-pay

## 2024-08-10 NOTE — Telephone Encounter (Signed)
 ALPRAZOLAM  Last OV: 07/20/24 Next OV: 08/20/24 Last RF: 07/20/24

## 2024-08-10 NOTE — Telephone Encounter (Signed)
 Called to confirm/remind patient of their appointment [facility] on 08/11/24.   Appointment:   [] Confirmed  [] Left mess   [x] No answer/No voice mail  [] VM Full/unable to leave message  [] Phone not in service

## 2024-08-10 NOTE — Telephone Encounter (Signed)
 Please call pt and see if really needs a refill

## 2024-08-10 NOTE — Telephone Encounter (Signed)
 Per the provider's request, contacted the patient regarding the med refill for alprazolam . No answer. Left a vm msg to return a call back to the clinic. Direct call back info provided.

## 2024-08-10 NOTE — Progress Notes (Signed)
 Primary Care: Dr Alvan  Primary Cardiologist: Dr Pietro  HF Cardiology: Dr. Rolan Chief Complaint: Heart Failure follow-up  HPI: Tara Campbell is a 78 y.o. with a history of CAD, PAF, Asthma, HTN, DMII. Had previous PCI to RCA.   Admitted 04/2021 with NSTEMI. Had cath that showed severe complex stenosis of her LAD in the proximal mid and distal portion of the vessel.  Patent left circumflex with no significant stenosis, patent left main with no significant stenosis, patent RCA with RCA stents mild nonobstructive disease less than 50% stenosis and normal LVEDP.  Her LAD stenosis was unfavorable for PCI due to heavy calcification and marked tortuosity. There is also noted to be a small caliber vessel with likely poor targets for CABG. Medical therapy was recommended.  Plavix  stopped in 9/22 due to nose bleeds.   She was admitted in 1/23 with ?TIA => neurology evaluated, imaging showed no CVA.  Echo in 1/23 showed EF 60-65%, normal RV, IVC normal.   Patient had markedly positive cardiac PET for anterior ischemia.  She had difficult PCI to proximal LAD in 9/23 with DES and PTCA mid LAD.  There was residual 90% stenosis in the mid/distal LAD that could not be approached due to tortuosity. Patient developed chest pain in 10/23 and went to the ER with NSTEMI, repeat cath was unchanged from 9/23 with patent stent, 50% mLAD stenosis at site of PTCA, and 90% mid-distal LAD (not approachable percutaneously). She had been off Plavix  by accident when this event occurred.  She was treated medically.  She was back in the ER again later in 10/23 with chest pain, troponin was negative.  She was started on ranolazine .   She had motion sickness and pain between her shoulder blades while on vacation in [place] in 6/24.  Echo showed EF 70% and stress test showed no ischemia. Troponin negative.  Presented to PCP with wheezing and persistent cough. She has since been treated with 3 rounds antibiotics and  2 rounds of prednisone . CXR without infiltrates. She was then seen by Pulm who are treating with albuterol , nebs and breo.  Of note, she did have breast biopsy 12/03/23 showing preliminary cancer. She will need lumpectomy and radiation soon.  Today she returns for HF follow up. Overall feeling good. Denies increasing SOB, CP, dizziness, edema, or PND/Orthopnea. Appetite ok. She needs surgical clearance for lumpectomy with Dr. Ebbie. Also considering breast reduction surgery and has an appointment with Plastic Surgery for consultation, as she is having back pain related to breast size. Taking all medications.   She is cleared for surgery from a heart failure perspective. Okay to hold Eliquis  for procedure.  Labs (1/23): K 3.9, creatinine 0.9, LDL 66 Labs (10/23): K 4.2, creatinine 1.5, LDL 18 Labs (4/24): K 4.3, SCr .99, TSH 2.71, LDL 108, Hgb A1c 6.3 Labs (7/24): K 4.5, creatinine 0.96, hgb 12 Labs (9/24): K 4.1, creatinine 1.15, hgbA1C 6.5, LDL 188 Labs (1/25): K 4.0, creatinine 1.28  PMH: 1. CAD: Remote PCI to RCA.  NSTEMI in 5/22, LHC showed severe complex stenosis proximal/mid/distal LAD with patent RCA stents.  LAD was unfavorable for PCI with marked tortuosity and small caliber, also poor target for CABG due to small size.  Medical management.  - Echo (1/23): EF 60-65%, normal RV, normal IVC.  - LHC (9/23): She had difficult PCI to proximal LAD with DES and PTCA mid LAD.  There was residual 90% stenosis in the mid/distal LAD that could not  be approached due to tortuosity. - NSTEMI (10/23): Repeat cath was unchanged from 9/23 with patent stent, 50% mLAD stenosis at site of PTCA, and 90% mid-distal LAD (not approachable percutaneously). - Stress test in [place] in 6/24 with no ischemia.  2. Atrial fibrillation: Paroxysmal.  3. ?TIA in 1/23: Imaging negative.  4. HTN 5. Type 2 diabetes 6. Asthma 7. ABIs normal in 12/22 8. Gout 9. Hyperlipidemia  Review of Systems: All systems  reviewed and negative except as per HPI.  Current Outpatient Medications  Medication Sig Dispense Refill   acetic acid  2 % otic solution Place 6 drops into both ears 4 (four) times daily. PRN 15 mL 0   albuterol  (VENTOLIN  HFA) 108 (90 Base) MCG/ACT inhaler Inhale 2 puffs into the lungs every 6 (six) hours as needed for wheezing. 18 g 5   allopurinol  (ZYLOPRIM ) 100 MG tablet Take 1 tablet (100 mg total) by mouth daily. 90 tablet 0   ALPRAZolam  (XANAX ) 0.25 MG tablet Take 1 tablet (0.25 mg total) by mouth daily as needed for anxiety. 10 tablet 0   anastrozole  (ARIMIDEX ) 1 MG tablet Take 1 tablet (1 mg total) by mouth daily. 90 tablet 3   atenolol  (TENORMIN ) 100 MG tablet Take 1 tablet (100 mg total) by mouth daily. 90 tablet 1   atorvastatin  (LIPITOR ) 80 MG tablet Take 1 tablet (80 mg total) by mouth at bedtime. 90 tablet 3   BREZTRI  AEROSPHERE 160-9-4.8 MCG/ACT AERO inhaler Inhale 2 puffs into the lungs 2 (two) times daily.     cyanocobalamin  (VITAMIN B12) 1000 MCG tablet Take 1,000 mcg by mouth daily.     ELIQUIS  5 MG TABS tablet Take 1 tablet (5 mg total) by mouth 2 (two) times daily. 180 tablet 3   empagliflozin  (JARDIANCE ) 10 MG TABS tablet Take 1 tablet (10 mg total) by mouth daily before breakfast. 100 tablet 4   EPINEPHRINE  0.3 mg/0.3 mL IJ SOAJ injection Inject 0.3 mg into the muscle as needed for anaphylaxis. Call 9-1-1 after use. 2 each 2   escitalopram  (LEXAPRO ) 5 MG tablet Take 1 tablet (5 mg total) by mouth daily. 30 tablet 1   Evolocumab  (REPATHA  SURECLICK) 140 MG/ML SOAJ Inject 140 mg into the skin every 14 (fourteen) days. 6 mL 1   ezetimibe  (ZETIA ) 10 MG tablet Take 1 tablet (10 mg total) by mouth daily. 90 tablet 3   glucose blood test strip To be used twice daily for testing blood sugars. E11.65 200 each 11   ipratropium (ATROVENT ) 0.03 % nasal spray Place 2 sprays into both nostrils every 12 (twelve) hours. 30 mL 0   ipratropium-albuterol  (DUONEB) 0.5-2.5 (3) MG/3ML SOLN Take  3 mLs by nebulization every 6 (six) hours as needed. 3 mL PRN   irbesartan -hydrochlorothiazide  (AVALIDE) 300-12.5 MG tablet Take 1 tablet by mouth daily.     isosorbide  mononitrate (IMDUR ) 120 MG 24 hr tablet Take 60-120 mg by mouth See admin instructions. Take 60 mg in the morning and 120 mg in the evening     levothyroxine  (SYNTHROID ) 125 MCG tablet Take 1 tablet (125 mcg total) by mouth daily before breakfast. 90 tablet 0   Mepolizumab  ([name] ) 100 MG/ML SOAJ Inject 1 mL (100 mg total) into the skin every 28 (twenty-eight) days. 1 mL 4   nitroGLYCERIN  (NITROSTAT ) 0.4 MG SL tablet Place 1 tablet (0.4 mg total) under the tongue every 5 (five) minutes as needed for chest pain (or tightness). 25 tablet PRN   Omega-3 Fatty Acids (FISH OIL )  1000 MG CAPS Take 1 capsule (1,000 mg total) by mouth daily. 90 capsule 3   ranolazine  (RANEXA ) 500 MG 12 hr tablet Take 1 tablet (500 mg total) by mouth 2 (two) times daily. 60 tablet 5   tirzepatide  (MOUNJARO ) 7.5 MG/0.5ML Pen Inject 7.5 mg into the skin once a week. 6 mL 1   Vitamin D , Cholecalciferol , 25 MCG (1000 UT) TABS Take 25 mcg by mouth daily in the afternoon. 60 tablet    zinc  gluconate 50 MG tablet Take 50 mg by mouth at bedtime.     No current facility-administered medications for this visit.    Allergies  Allergen Reactions   Sulfamethoxazole Hives and Rash   Sertraline  Nausea And Vomiting   Sulfa Antibiotics Hives and Rash   Tape Rash    Vinyl;tape    Social History   Socioeconomic History   Marital status: Divorced    Spouse name: Not on file   Number of children: 2   Years of education: 13.5   Highest education level: Some college, no degree  Occupational History    Comment: retired  Tobacco Use   Smoking status: Never   Smokeless tobacco: Never  Vaping Use   Vaping status: Never Used  Substance and Sexual Activity   Alcohol use: Not Currently    Comment: Rare   Drug use: No   Sexual activity: Not Currently  Other Topics  Concern   Not on file  Social History Narrative   Lives alone. Likes to crafts and paint in her free time.      Right Handed    Lives in a one story home    Social Drivers of Health   Financial Resource Strain: Low Risk  (06/15/2024)   Overall Financial Resource Strain (CARDIA)    Difficulty of Paying Living Expenses: Not very hard  Food Insecurity: No Food Insecurity (06/15/2024)   Hunger Vital Sign    Worried About Running Out of Food in the Last Year: Never true    Ran Out of Food in the Last Year: Never true  Transportation Needs: No Transportation Needs (06/15/2024)   PRAPARE - Administrator, Civil Service (Medical): No    Lack of Transportation (Non-Medical): No  Physical Activity: Insufficiently Active (06/15/2024)   Exercise Vital Sign    Days of Exercise per Week: 2 days    Minutes of Exercise per Session: 20 min  Stress: No Stress Concern Present (06/15/2024)   [employer] of Occupational Health - Occupational Stress Questionnaire    Feeling of Stress: Only a little  Social Connections: Moderately Isolated (06/15/2024)   Social Connection and Isolation Panel    Frequency of Communication with Friends and Family: More than three times a week    Frequency of Social Gatherings with Friends and Family: More than three times a week    Attends Religious Services: 1 to 4 times per year    Active Member of [employer] or Organizations: No    Attends Banker Meetings: Not on file    Marital Status: Divorced  Intimate Partner Violence: Not At Risk (01/28/2024)   Humiliation, Afraid, Rape, and Kick questionnaire    Fear of Current or Ex-Partner: No    Emotionally Abused: No    Physically Abused: No    Sexually Abused: No      Family History  Problem Relation Age of Onset   Hypertension Mother    Hyperlipidemia Mother    Alzheimer's disease Mother  Vascular Disease Mother    Glaucoma Father    Heart disease Father    Hypertension Father     Diabetes Father    Hyperlipidemia Father    Skin cancer Father        SCC; dx 92s-80s   Diabetes Paternal Grandmother    Colon cancer Neg Hx    Esophageal cancer Neg Hx    There were no vitals taken for this visit.  Wt Readings from Last 3 Encounters:  07/20/24 80.3 kg (177 lb 1.3 oz)  07/15/24 76.2 kg (168 lb)  06/23/24 80.3 kg (177 lb)   Physical Exam General: Well appearing. No distress on RA HEENT: neck supple.   Cardiac: JVP not visible. S1 and S2 present. No murmurs or rub. Resp: Lung sounds clear and equal B/L Abdomen: Soft, non-tender, non-distended. + BS. Extremities: Warm and dry. No rash, cyanosis.  No edema.  Neuro: Alert and oriented x3. Affect pleasant. Moves all extremities without difficulty.  ASSESSMENT & PLAN: 1. CAD: Remote RCA PCI.  NSTEMI in 5/22, LHC showed severe complex stenosis proximal/mid/distal LAD with patent RCA stents.  LAD was unfavorable for PCI with marked tortuosity and small caliber, also poor target for CABG due to small size.  Medical management initially.  Patient had worsening of angina and cardiac PET was done that was high risk.  She then had cath in 9/23 with difficult PCI to proximal LAD with DES and PTCA mid LAD.  There was residual 90% stenosis in the mid/distal LAD that could not be approached due to tortuosity.  She had NSTEMI in 10/23 with repeat cath unchanged from 9/23 with patent stent, 50% mLAD stenosis at site of PTCA, and 90% mid-distal LAD (not approachable percutaneously). Negative nuclear stress test in 6/24 in [place] for atypical chest pain.  No further chest pain. NYHA I. - Completed 1 year of Plavix  post-PCI. Continue Eliquis . No ASA with Eliquis  - Continue atorvastatin  80 mg daily + Zetia  + Repatha . LDL 188 (9/24). Refer to Lipid Clinic today for further management. - Continue atenolol  100 mg daily.  - Continue Imdur  30 mg in the morning and 60 mg in the evening.  - Continue ranolazine  500 mg bid - Continue Jardiance  10 mg  daily, No GU symptoms. 2. Atrial fibrillation: Paroxysmal.  - Normal rhythm on exam - Continue Eliquis . 3. HTN: BP controlled.  - Continue current medications. 4. Asthma: Followed by Dr. Theophilus.  5. Breast Cancer: biopsy 12/24 + for preliminary cancer. Will need lumpectomy +/- adjuvant radiation therapy + adjuvant antiestrogen therapy - Cleared for surgery from a heart failure perspective. Okay to hold Eliquis  for procedure. - undergoing genetic counseling  Follow up in 4 months with Dr. Rolan Tara CHRISTELLA Glena, NP 08/10/2024

## 2024-08-11 ENCOUNTER — Ambulatory Visit (HOSPITAL_COMMUNITY)
Admission: RE | Admit: 2024-08-11 | Discharge: 2024-08-11 | Disposition: A | Discharge status: Home / Self Care | Source: Ambulatory Visit | Attending: Family Medicine

## 2024-08-11 ENCOUNTER — Encounter (HOSPITAL_COMMUNITY): Payer: Self-pay

## 2024-08-11 ENCOUNTER — Telehealth: Payer: Self-pay

## 2024-08-11 VITALS — BP 94/56 | HR 71 | Ht 63.0 in | Wt 177.2 lb

## 2024-08-11 DIAGNOSIS — Z853 Personal history of malignant neoplasm of breast: Secondary | ICD-10-CM | POA: Diagnosis not present

## 2024-08-11 DIAGNOSIS — J45909 Unspecified asthma, uncomplicated: Secondary | ICD-10-CM | POA: Insufficient documentation

## 2024-08-11 DIAGNOSIS — I10 Essential (primary) hypertension: Secondary | ICD-10-CM | POA: Diagnosis not present

## 2024-08-11 DIAGNOSIS — Z8673 Personal history of transient ischemic attack (TIA), and cerebral infarction without residual deficits: Secondary | ICD-10-CM | POA: Insufficient documentation

## 2024-08-11 DIAGNOSIS — Z923 Personal history of irradiation: Secondary | ICD-10-CM | POA: Insufficient documentation

## 2024-08-11 DIAGNOSIS — G459 Transient cerebral ischemic attack, unspecified: Secondary | ICD-10-CM | POA: Diagnosis not present

## 2024-08-11 DIAGNOSIS — E119 Type 2 diabetes mellitus without complications: Secondary | ICD-10-CM | POA: Diagnosis not present

## 2024-08-11 DIAGNOSIS — R040 Epistaxis: Secondary | ICD-10-CM | POA: Insufficient documentation

## 2024-08-11 DIAGNOSIS — Z79890 Hormone replacement therapy (postmenopausal): Secondary | ICD-10-CM | POA: Insufficient documentation

## 2024-08-11 DIAGNOSIS — Z7952 Long term (current) use of systemic steroids: Secondary | ICD-10-CM | POA: Diagnosis not present

## 2024-08-11 DIAGNOSIS — I252 Old myocardial infarction: Secondary | ICD-10-CM | POA: Diagnosis not present

## 2024-08-11 DIAGNOSIS — R053 Chronic cough: Secondary | ICD-10-CM | POA: Insufficient documentation

## 2024-08-11 DIAGNOSIS — Z7901 Long term (current) use of anticoagulants: Secondary | ICD-10-CM | POA: Insufficient documentation

## 2024-08-11 DIAGNOSIS — Z955 Presence of coronary angioplasty implant and graft: Secondary | ICD-10-CM | POA: Diagnosis not present

## 2024-08-11 DIAGNOSIS — Z7984 Long term (current) use of oral hypoglycemic drugs: Secondary | ICD-10-CM | POA: Insufficient documentation

## 2024-08-11 DIAGNOSIS — Z79899 Other long term (current) drug therapy: Secondary | ICD-10-CM | POA: Insufficient documentation

## 2024-08-11 DIAGNOSIS — Z7985 Long-term (current) use of injectable non-insulin antidiabetic drugs: Secondary | ICD-10-CM | POA: Diagnosis not present

## 2024-08-11 DIAGNOSIS — C50919 Malignant neoplasm of unspecified site of unspecified female breast: Secondary | ICD-10-CM | POA: Diagnosis not present

## 2024-08-11 DIAGNOSIS — Z951 Presence of aortocoronary bypass graft: Secondary | ICD-10-CM | POA: Diagnosis not present

## 2024-08-11 DIAGNOSIS — I480 Paroxysmal atrial fibrillation: Secondary | ICD-10-CM | POA: Diagnosis not present

## 2024-08-11 DIAGNOSIS — I2510 Atherosclerotic heart disease of native coronary artery without angina pectoris: Secondary | ICD-10-CM | POA: Insufficient documentation

## 2024-08-11 LAB — CBC
HCT: 33.4 % — ABNORMAL LOW (ref 36.0–46.0)
Hemoglobin: 11.4 g/dL — ABNORMAL LOW (ref 12.0–15.0)
MCH: 30.3 pg (ref 26.0–34.0)
MCHC: 34.1 g/dL (ref 30.0–36.0)
MCV: 88.8 fL (ref 80.0–100.0)
Platelets: 348 K/uL (ref 150–400)
RBC: 3.76 MIL/uL — ABNORMAL LOW (ref 3.87–5.11)
RDW: 14.1 % (ref 11.5–15.5)
WBC: 9 K/uL (ref 4.0–10.5)
nRBC: 0 % (ref 0.0–0.2)

## 2024-08-11 LAB — LIPID PANEL
Cholesterol: 149 mg/dL (ref 0–200)
HDL: 53 mg/dL (ref >40)
LDL Cholesterol: 64 mg/dL (ref 0–99)
Total CHOL/HDL Ratio: 2.8 ratio
Triglycerides: 162 mg/dL — ABNORMAL HIGH (ref <150)
VLDL: 32 mg/dL (ref 0–40)

## 2024-08-11 LAB — BASIC METABOLIC PANEL WITH GFR
Anion gap: 11 (ref 5–15)
BUN: 17 mg/dL (ref 8–23)
CO2: 24 mmol/L (ref 22–32)
Calcium: 10.1 mg/dL (ref 8.9–10.3)
Chloride: 101 mmol/L (ref 98–111)
Creatinine, Ser: 1.45 mg/dL — ABNORMAL HIGH (ref 0.44–1.00)
GFR, Estimated: 36 mL/min — ABNORMAL LOW (ref >60)
Glucose, Bld: 105 mg/dL — ABNORMAL HIGH (ref 70–99)
Potassium: 4.6 mmol/L (ref 3.5–5.1)
Sodium: 136 mmol/L (ref 135–145)

## 2024-08-11 MED ORDER — ISOSORBIDE MONONITRATE ER 30 MG PO TB24: ORAL_TABLET | ORAL | 3 refills | Status: DC

## 2024-08-11 NOTE — Telephone Encounter (Signed)
 Copied from CRM #[id number]. Topic: Clinical - Medication Question >> Aug 11, 2024  9:10 AM Miquel SAILOR wrote: Reason for CRM: ALPRAZolam  (XANAX ) 0.25 MG tablet-Patient returning call from RN on 09/08.Called office instructed will send message to RN and have them call back PT. [phone]

## 2024-08-11 NOTE — Telephone Encounter (Signed)
 Called patient. Left a detailed voice mail message on cell # ( allowed on DPR )  Requesting a return call with clarification of what is needed.  Refill?  I did not see a note where our office had called her but there was one from cardiology from yesterday.

## 2024-08-11 NOTE — Patient Instructions (Signed)
 Medication Changes:  TAKE ISOSORBIDE  60MG  IN THE MORNING, AND 30MG  IN THE EVENING   Lab Work:  Labs done today, your results will be available in MyChart, we will contact you for abnormal readings.  Follow-Up in: 6 MONTHS PLEASE CALL OUR OFFICE AROUND NOVEMBER TO GET SCHEDULED FOR YOUR JANURARY APPOINTMENT. PHONE NUMBER IS [phone] OPTION 2   At the [facility], you and your health needs are our priority. We have a designated team specialized in the treatment of Heart Failure. This Care Team includes your primary Heart Failure Specialized Cardiologist (physician), Advanced Practice Providers (APPs- Physician Assistants and Nurse Practitioners), and Pharmacist who all work together to provide you with the care you need, when you need it.   You may see any of the following providers on your designated Care Team at your next follow up:  Dr. Toribio Fuel Dr. Ezra Shuck Dr. Ria Commander Dr. Odis Brownie Greig Mosses, NP Caffie Shed, [place] [facility] [place], [place] Beckey Coe, NP [place] Lee, NP Tinnie Redman, PharmD   Please be sure to bring in all your medications bottles to every appointment.   Need to Contact Us :  If you have any questions or concerns before your next appointment please send us  a message through [employer] or call our office at [phone].    TO LEAVE A MESSAGE FOR THE NURSE SELECT OPTION 2, PLEASE LEAVE A MESSAGE INCLUDING: YOUR NAME DATE OF BIRTH CALL BACK NUMBER REASON FOR CALL**this is important as we prioritize the call backs  YOU WILL RECEIVE A CALL BACK THE SAME DAY AS LONG AS YOU CALL BEFORE 4:00 PM

## 2024-08-12 ENCOUNTER — Ambulatory Visit (HOSPITAL_COMMUNITY): Payer: Self-pay | Admitting: Family Medicine

## 2024-08-12 ENCOUNTER — Other Ambulatory Visit: Payer: Self-pay | Admitting: Family Medicine

## 2024-08-13 ENCOUNTER — Telehealth: Payer: Self-pay

## 2024-08-13 NOTE — Telephone Encounter (Signed)
 Copied from CRM #[id number]. Topic: General - Other >> Aug 13, 2024 12:12 PM Kevelyn M wrote: Reason for CRM: Patient calling Kim back.  Call back: [phone]

## 2024-08-13 NOTE — Telephone Encounter (Signed)
 Again attempted call to patient. Left a voice mail message requesting a return call.

## 2024-08-17 ENCOUNTER — Ambulatory Visit: Admitting: Family Medicine

## 2024-08-17 NOTE — Telephone Encounter (Signed)
 Patient stats taking the Lexapro  has made a world of difference for her and right now she does not need a  refill of the Alprazolam . She will reach out to us  if anything should change

## 2024-08-17 NOTE — Telephone Encounter (Signed)
  Patient stats taking the Lexapro  has made a world of difference for her and right now she does not need a  refill of the Alprazolam . She will reach out to us  if anything should change

## 2024-08-17 NOTE — Telephone Encounter (Signed)
 Patient states she does  need a refill or anything regarding this any further

## 2024-08-19 ENCOUNTER — Ambulatory Visit (INDEPENDENT_AMBULATORY_CARE_PROVIDER_SITE_OTHER)

## 2024-08-19 DIAGNOSIS — Z780 Asymptomatic menopausal state: Secondary | ICD-10-CM | POA: Diagnosis not present

## 2024-08-19 DIAGNOSIS — Z13820 Encounter for screening for osteoporosis: Secondary | ICD-10-CM | POA: Diagnosis not present

## 2024-08-19 DIAGNOSIS — M8589 Other specified disorders of bone density and structure, multiple sites: Secondary | ICD-10-CM | POA: Diagnosis not present

## 2024-08-19 NOTE — Telephone Encounter (Signed)
 08/17/24  8:59 AM Note   Patient stats taking the Lexapro  has made a world of difference for her and right now she does not need a  refill of the Alprazolam . She will reach out to us  if anything should change

## 2024-08-20 ENCOUNTER — Ambulatory Visit: Admitting: Family Medicine

## 2024-08-20 ENCOUNTER — Encounter: Payer: Self-pay | Admitting: Family Medicine

## 2024-08-20 VITALS — BP 113/63 | HR 64 | Temp 98.2°F | Ht 63.0 in | Wt 179.0 lb

## 2024-08-20 DIAGNOSIS — R413 Other amnesia: Secondary | ICD-10-CM | POA: Diagnosis not present

## 2024-08-20 DIAGNOSIS — Z23 Encounter for immunization: Secondary | ICD-10-CM | POA: Diagnosis not present

## 2024-08-20 DIAGNOSIS — R42 Dizziness and giddiness: Secondary | ICD-10-CM

## 2024-08-20 DIAGNOSIS — R4586 Emotional lability: Secondary | ICD-10-CM | POA: Diagnosis not present

## 2024-08-20 DIAGNOSIS — F418 Other specified anxiety disorders: Secondary | ICD-10-CM

## 2024-08-20 MED ORDER — ESCITALOPRAM OXALATE 5 MG PO TABS: 5.0000 mg | ORAL_TABLET | Freq: Every day | ORAL | 1 refills | Status: AC

## 2024-08-20 NOTE — Progress Notes (Signed)
   Established Patient Office Visit  Subjective  Patient ID: Tara Campbell, female    DOB: Dec 27, 1945  Age: 78 y.o. MRN: [id number]  Chief Complaint  Patient presents with   Follow-up    LEXAPRO  Patient states she is feeling very well with medication no concerns Patient states she is taking half a tablet of her irbesartan - hydrochlorothiazide  12.5mg  tablet    HPI  Discussed the use of AI scribe software for clinical note transcription with the patient, who gave verbal consent to proceed.  History of Present Illness Tara Campbell is an 78 year old female who presents for follow-up regarding medication management and concerns about dementia.  Mood disturbance and anxiety - Lexapro  5 mg at bedtime is effective for mood stabilization, with no crying spells since initiation - No adverse effects from Lexapro  - Current dose of Lexapro  is sufficient despite awareness that typical dose is 10 mg - Alprazolam  (Xanax ) previously used as rescue medication for anxiety, but not needed recently - Alprazolam  prescription has expired and has not been filled; patient requests removal from medication list to avoid unnecessary refills  Cognitive concerns - Concerned about dementia due to maternal history - Recent brain MRI was normal with no evidence of cerebral atrophy - Previously underwent cognitive testing, including MOCA, with a neurologist - Interested in ongoing monitoring of cognitive health  Headache and sleep quality - Headaches have improved - Sleeping well at night  Immunization status - Received influenza vaccination - Plans to receive COVID-19 vaccine    {History (Optional):23778}  ROS    Objective:     BP (!) 102/58 (BP Location: Right Wrist, Patient Position: Sitting, Cuff Size: Small)   Pulse 64   Temp 98.2 F (36.8 C) (Oral)   Ht 5' 3 (1.6 m)   Wt 179 lb (81.2 kg)   SpO2 96%   BMI 31.71 kg/m  {Vitals History (Optional):23777}  Physical  Exam   No results found for any visits on 08/20/24.  {Labs (Optional):23779}  The ASCVD Risk score (Arnett DK, et al., 2019) failed to calculate for the following reasons:   The 2019 ASCVD risk score is only valid for ages 64 to 41   Risk score cannot be calculated because patient has a medical history suggesting prior/existing ASCVD    Assessment & Plan:   Problem List Items Addressed This Visit   None Visit Diagnoses       Episodic lightheadedness    -  Primary     Mood change       Relevant Medications   escitalopram  (LEXAPRO ) 5 MG tablet       Assessment and Plan Assessment & Plan Depression and Anxiety Disorders Depression and anxiety well-managed with Lexapro . Xanax  not needed. Discussed habit-forming potential of Xanax . - Continue Lexapro  5 mg daily at bedtime. - Reassess Lexapro  use in six months for possible tapering. - Remove Xanax  from medication list.  Cognitive Concerns and Possible Memory Impairment Concerns about dementia due to family history. Recent MRI showed no significant brain atrophy. Explained normal MRI does not rule out dementia. Discussed neuropsychiatric testing. - Perform MOCA test at next visit. - Consider neuropsychiatrist referral if concerns persist.  General Health Maintenance Up to date with eye exam and flu vaccination. Plans to receive COVID-19 vaccination today. - Administer Pfizer COVID-19 vaccine today. - Obtain recent eye exam copy from August.    Return in about 2 months (around 10/20/2024) for Diabetes follow-up.    Dorothyann Byars, MD

## 2024-08-21 NOTE — Assessment & Plan Note (Signed)
 Cognitive Concerns and Possible Memory Impairment Concerns about dementia due to family history. Recent MRI showed no significant brain atrophy. Explained normal MRI does not rule out dementia. Discussed neuropsychiatric testing. - Perform MOCA test at next visit. - Consider neuropsychiatrist referral if concerns persist.

## 2024-08-21 NOTE — Assessment & Plan Note (Signed)
 Depression and Anxiety Disorders Depression and anxiety well-managed with Lexapro . Xanax  not needed. Discussed habit-forming potential of Xanax . - Continue Lexapro  5 mg daily at bedtime. - Reassess Lexapro  use in six months for possible tapering. - Remove Xanax  from medication list.

## 2024-09-17 ENCOUNTER — Ambulatory Visit: Admitting: Family Medicine

## 2024-09-18 ENCOUNTER — Telehealth: Payer: Self-pay | Admitting: Pharmacy Technician

## 2024-09-18 NOTE — Telephone Encounter (Signed)
   Pharmacy Patient Advocate Encounter   Received notification from Onbase that prior authorization for repatha  is required/requested.   Insurance verification completed.   The patient is insured through [facility].   Per test claim: PA required; PA submitted to above mentioned insurance via Latent Key/confirmation #/EOC [id number] Status is pending

## 2024-09-18 NOTE — Telephone Encounter (Signed)
 Pharmacy Patient Advocate Encounter  Received notification from [employer] that Prior Authorization for repatha  has been APPROVED from 09/18/24 to 12/02/25   PA #/Case ID/Reference #: [id number]

## 2024-10-15 ENCOUNTER — Telehealth: Payer: Self-pay | Admitting: Family Medicine

## 2024-10-15 ENCOUNTER — Ambulatory Visit (INDEPENDENT_AMBULATORY_CARE_PROVIDER_SITE_OTHER): Admitting: Family Medicine

## 2024-10-15 ENCOUNTER — Encounter: Payer: Self-pay | Admitting: Family Medicine

## 2024-10-15 VITALS — BP 130/65 | HR 85 | Ht 63.0 in | Wt 183.0 lb

## 2024-10-15 DIAGNOSIS — N1831 Chronic kidney disease, stage 3a: Secondary | ICD-10-CM | POA: Diagnosis not present

## 2024-10-15 DIAGNOSIS — R7989 Other specified abnormal findings of blood chemistry: Secondary | ICD-10-CM

## 2024-10-15 DIAGNOSIS — E1122 Type 2 diabetes mellitus with diabetic chronic kidney disease: Secondary | ICD-10-CM | POA: Diagnosis not present

## 2024-10-15 DIAGNOSIS — R413 Other amnesia: Secondary | ICD-10-CM | POA: Diagnosis not present

## 2024-10-15 DIAGNOSIS — E118 Type 2 diabetes mellitus with unspecified complications: Secondary | ICD-10-CM

## 2024-10-15 LAB — POCT GLYCOSYLATED HEMOGLOBIN (HGB A1C): Hemoglobin A1C: 5.9 % — AB (ref 4.0–5.6)

## 2024-10-15 NOTE — Telephone Encounter (Signed)
 Call pt :  MOCA score 21 out of 30 so some mild memory impairment at this point but she is only had a drop in 1 point in the last 2 years which is pretty stable which is very reassuring continue to work on controlling risk factors like blood pressure, cholesterol etc. continue to work on healthy lifestyle and weight management and regular exercise to reduce risk for advancing dementia.

## 2024-10-15 NOTE — Assessment & Plan Note (Addendum)
 01/01/22:   MOCA score of 22 10/13/24: MOCA score: 21  Score 21 out of 30 so some mild memory impairment at this point but she is only had a drop in 1 point in the last 2 years which is pretty stable which is very reassuring continue to work on controlling risk factors like blood pressure, cholesterol etc. continue to work on healthy lifestyle and weight management and regular exercise to reduce risk for advancing dementia.

## 2024-10-15 NOTE — Assessment & Plan Note (Addendum)
 Type 2 diabetes mellitus Well-controlled with A1c of 5.9. Mounjaro  switch noted, no anticipated kidney impact. - Continue current diabetes management plan. - Managing constipation.  - continue Jardiance  for renal protection

## 2024-10-15 NOTE — Progress Notes (Addendum)
 Established Patient Office Visit  Patient ID: Tara Campbell, female    DOB: March 15, 1946  Age: 78 y.o. MRN: [id number] PCP: Alvan Tara BIRCH, MD  Chief Complaint  Patient presents with   Diabetes    Subjective:     HPI  Discussed the use of AI scribe software for clinical note transcription with the patient, who gave verbal consent to proceed.  History of Present Illness Tara Campbell is an 78 year old female who presents for follow-up of elevated creatinine levels.  Renal function abnormalities - Elevated creatinine level of 1.4 in September, increased from baseline range of 0.9 to 1.1 - Previous creatinine levels in July and August were 0.9 - Occasional prior readings of 1.1 and 1.2, but never higher than 1.2 - Most recent GFR was 36; previous GFR was 60 when creatinine was 0.96 - No symptoms of illness or dehydration around the time of elevated creatinine - No recent procedures involving contrast dye - No new over-the-counter supplements started  Medication use and effects - Current medications include Mounjaro  (started 4-5 months ago), anastrozole  1 mg, and Colace as needed every other day - No NSAID use; prefers Tylenol  for analgesia - No hot flashes associated with current medications  Hydration status and dietary habits - Maintains good hydration, primarily drinking water and iced tea - Avoids soda  Glycemic control - A1c is well-controlled at 5.9     Lab Results  Component Value Date   CREATININE 1.09 (H) 10/15/2024   CREATININE 1.45 (H) 08/11/2024   CREATININE 0.96 07/15/2024       ROS    Objective:     BP 130/65   Pulse 85   Ht 5' 3 (1.6 m)   Wt 183 lb 0.6 oz (83 kg)   SpO2 95%   BMI 32.42 kg/m     Physical Exam Vitals and nursing note reviewed.  Constitutional:      Appearance: Normal appearance.  HENT:     Head: Normocephalic and atraumatic.  Eyes:     Conjunctiva/sclera: Conjunctivae normal.  Cardiovascular:      Rate and Rhythm: Normal rate and regular rhythm.  Pulmonary:     Effort: Pulmonary effort is normal.     Breath sounds: Normal breath sounds.  Skin:    General: Skin is warm and dry.  Neurological:     Mental Status: She is alert.  Psychiatric:        Mood and Affect: Mood normal.      Results for orders placed or performed in visit on 10/15/24  Basic Metabolic Panel (BMET)  Result Value Ref Range   Glucose 94 70 - 99 mg/dL   BUN 17 8 - 27 mg/dL   Creatinine, Ser 8.90 (H) 0.57 - 1.00 mg/dL   eGFR 51 (L) >40 fO/fpw/8.26   BUN/Creatinine Ratio 16 12 - 28   Sodium 142 134 - 144 mmol/L   Potassium 4.2 3.5 - 5.2 mmol/L   Chloride 103 96 - 106 mmol/L   CO2 23 20 - 29 mmol/L   Calcium  9.9 8.7 - 10.3 mg/dL  POCT HgB J8R  Result Value Ref Range   Hemoglobin A1C 5.9 (A) 4.0 - 5.6 %   HbA1c POC (<> result, manual entry)     HbA1c, POC (prediabetic range)     HbA1c, POC (controlled diabetic range)        The ASCVD Risk score (Arnett DK, et al., 2019) failed to calculate for the following reasons:  The 2019 ASCVD risk score is only valid for ages 25 to 39   Risk score cannot be calculated because patient has a medical history suggesting prior/existing ASCVD    Assessment & Plan:   Problem List Items Addressed This Visit       Endocrine   Type 2 diabetes mellitus with diabetic chronic kidney disease (HCC) - Primary   Type 2 diabetes mellitus Well-controlled with A1c of 5.9. Mounjaro  switch noted, no anticipated kidney impact. - Continue current diabetes management plan. - Managing constipation.  - continue Jardiance  for renal protection       Relevant Orders   POCT HgB A1C (Completed)     Other   Memory impairment   01/01/22:   MOCA score of 22 10/13/24: MOCA score: 21  Score 21 out of 30 so some mild memory impairment at this point but she is only had a drop in 1 point in the last 2 years which is pretty stable which is very reassuring continue to work on  controlling risk factors like blood pressure, cholesterol etc. continue to work on healthy lifestyle and weight management and regular exercise to reduce risk for advancing dementia.      Other Visit Diagnoses       Elevated serum creatinine       Relevant Orders   Basic Metabolic Panel (BMET) (Completed)       Assessment and Plan Assessment & Plan Chronic kidney disease, stage 3a Recent creatinine increase to 1.45, GFR decreased to 36. Possible causes include dehydration or lab error. No NSAID or contrast dye exposure. Mounjaro  initiation unlikely to affect kidney function. - Recheck creatinine and GFR levels. - Perform additional blood work and check for proteinuria if creatinine remains elevated. - Consider renal ultrasound if creatinine remains elevated. - Advised to avoid NSAIDs and maintain hydration.  Memory impairment Undergoing evaluation with MOCA for cognitive function. - Administered MOCA test.     10/15/2024    2:34 PM 01/01/2022    9:00 AM  Montreal Cognitive Assessment   Visuospatial/ Executive (0/5) 5 3  Naming (0/3) 3 3  Attention: Read list of digits (0/2) 2 2  Attention: Read list of letters (0/1) 1 1  Attention: Serial 7 subtraction starting at 100 (0/3) 1 3  Language: Repeat phrase (0/2) 1 2  Language : Fluency (0/1) 1 0  Abstraction (0/2) 2 2  Delayed Recall (0/5) 0 1  Orientation (0/6) 5 5  Total 21 22  Adjusted Score (based on education) 21      Return in about 3 months (around 01/15/2025) for Diabetes follow-up.    Tara Byars, MD [facility] at [facility]

## 2024-10-16 ENCOUNTER — Ambulatory Visit: Payer: Self-pay | Admitting: Family Medicine

## 2024-10-16 LAB — BASIC METABOLIC PANEL WITH GFR
BUN/Creatinine Ratio: 16 (ref 12–28)
BUN: 17 mg/dL (ref 8–27)
CO2: 23 mmol/L (ref 20–29)
Calcium: 9.9 mg/dL (ref 8.7–10.3)
Chloride: 103 mmol/L (ref 96–106)
Creatinine, Ser: 1.09 mg/dL — ABNORMAL HIGH (ref 0.57–1.00)
Glucose: 94 mg/dL (ref 70–99)
Potassium: 4.2 mmol/L (ref 3.5–5.2)
Sodium: 142 mmol/L (ref 134–144)
eGFR: 51 mL/min/1.73 — ABNORMAL LOW (ref >59)

## 2024-10-16 NOTE — Progress Notes (Signed)
 Actually I think she is at the perfect stage to start medication.  Normally we will start with Aricept.  Usually we start with the 5 mg dose at bedtime and then if tolerating well after 4 to 6 weeks we usually go up to 10 mg which is the maintenance dose.  It will not reverse memory issues but for the most part will maybe help maintain function so helping her keep memory intact for where it is today going forward.  It is usually well-tolerated.  We do like to monitor blood pressure a couple of months after starting it.  If she is okay with that I am happy to send over to the pharmacy and we can try it

## 2024-10-16 NOTE — Telephone Encounter (Signed)
 Spoke with patient informed of MOCA score. She states these results put her mind at ease somewhat. She will continue with Dr. Alvan recommendations as well.

## 2024-10-16 NOTE — Progress Notes (Signed)
 Hi Tara Campbell, great news!!!! Your creatinine looks much better back down to 1.0 which I think is closer to your baseline that is where you were a year ago.  And electrolytes look good as well.  I really most wonder if it was just a lab error.

## 2024-10-20 ENCOUNTER — Encounter: Payer: Self-pay | Admitting: Family Medicine

## 2024-10-26 MED ORDER — DONEPEZIL HCL 5 MG PO TBDP
5.0000 mg | ORAL_TABLET | Freq: Every day | ORAL | 1 refills | Status: AC
Start: 2024-10-26 — End: ?

## 2024-10-30 ENCOUNTER — Other Ambulatory Visit (HOSPITAL_COMMUNITY): Payer: Self-pay | Admitting: Family Medicine

## 2024-11-05 ENCOUNTER — Ambulatory Visit: Admitting: Family Medicine

## 2024-11-05 ENCOUNTER — Ambulatory Visit: Admitting: Pulmonary Disease

## 2024-11-05 ENCOUNTER — Encounter: Payer: Self-pay | Admitting: Family Medicine

## 2024-11-05 VITALS — BP 140/70 | HR 109 | Temp 99.9°F | Ht 63.0 in | Wt 183.0 lb

## 2024-11-05 DIAGNOSIS — J4550 Severe persistent asthma, uncomplicated: Secondary | ICD-10-CM | POA: Diagnosis not present

## 2024-11-05 DIAGNOSIS — J4541 Moderate persistent asthma with (acute) exacerbation: Secondary | ICD-10-CM | POA: Diagnosis not present

## 2024-11-05 DIAGNOSIS — R6889 Other general symptoms and signs: Secondary | ICD-10-CM | POA: Diagnosis not present

## 2024-11-05 DIAGNOSIS — J4540 Moderate persistent asthma, uncomplicated: Secondary | ICD-10-CM | POA: Diagnosis not present

## 2024-11-05 LAB — POC SOFIA 2 FLU + SARS ANTIGEN FIA
Influenza A, POC: NEGATIVE
Influenza B, POC: NEGATIVE
SARS Coronavirus 2 Ag: NEGATIVE

## 2024-11-05 LAB — POCT RAPID STREP A (OFFICE): Rapid Strep A Screen: NEGATIVE

## 2024-11-05 MED ORDER — ALBUTEROL SULFATE HFA 108 (90 BASE) MCG/ACT IN AERS: 2.0000 | INHALATION_SPRAY | Freq: Four times a day (QID) | RESPIRATORY_TRACT | 1 refills | Status: AC | PRN

## 2024-11-05 MED ORDER — PREDNISONE 20 MG PO TABS: 40.0000 mg | ORAL_TABLET | Freq: Every day | ORAL | 0 refills | Status: DC

## 2024-11-05 MED ORDER — IPRATROPIUM-ALBUTEROL 0.5-2.5 (3) MG/3ML IN SOLN: 3.0000 mL | Freq: Four times a day (QID) | RESPIRATORY_TRACT | 99 refills | Status: AC | PRN

## 2024-11-05 MED ORDER — DOXYCYCLINE HYCLATE 100 MG PO TABS: 100.0000 mg | ORAL_TABLET | Freq: Two times a day (BID) | ORAL | 0 refills | Status: DC

## 2024-11-05 NOTE — Progress Notes (Signed)
 Acute Office Visit  Patient ID: [name], female    DOB: 05/26/1946, 78 y.o.   MRN: [id number]  PCP: Alvan Dorothyann BIRCH, MD  Chief Complaint  Patient presents with   Cough    Subjective:     HPI  Discussed the use of AI scribe software for clinical note transcription with the patient, who gave verbal consent to proceed.  History of Present Illness Tara Campbell is an 78 year old female who presents with cough and wheezing. She is accompanied by another sick family member.  Cough and wheezing - Cough present, described as more of a sensation than an audible wheeze - Wheezing noted, consistent with prior episodes - Nocturnal symptoms absent after taking over-the-counter cough medication - No use of albuterol  inhaler yet, but has it available at home - Nebulizer and medication vials available at home - History of wheezing;   Upper respiratory symptoms - Mild sinus congestion - Throat has felt rough for the last couple of hours - No fever subjectively, but informed of a slight fever today - No ear pain or discomfort  Medication use - Currently taking over-the-counter cough medicine (large, orange pills), unsure of the name  Associated symptoms and pertinent negatives - No rashes - No diarrhea   ROS     Objective:    BP (!) 140/70   Pulse (!) 109   Temp 99.9 F (37.7 C) (Oral)   Ht 5' 3 (1.6 m)   Wt 183 lb (83 kg)   SpO2 100%   BMI 32.42 kg/m    Physical Exam Constitutional:      Appearance: Normal appearance.  HENT:     Head: Normocephalic and atraumatic.     Right Ear: Tympanic membrane, ear canal and external ear normal. There is no impacted cerumen.     Left Ear: Tympanic membrane, ear canal and external ear normal. There is no impacted cerumen.     Nose: Nose normal.     Mouth/Throat:     Pharynx: Oropharynx is clear.  Eyes:     Conjunctiva/sclera: Conjunctivae normal.  Cardiovascular:     Rate and Rhythm: Normal rate and  regular rhythm.  Pulmonary:     Effort: Pulmonary effort is normal.     Breath sounds: Wheezing and rhonchi present.     Comments: Diffuse wheezing Musculoskeletal:     Cervical back: Neck supple. No tenderness.  Lymphadenopathy:     Cervical: No cervical adenopathy.  Skin:    General: Skin is warm and dry.  Neurological:     Mental Status: She is alert and oriented to person, place, and time.  Psychiatric:        Mood and Affect: Mood normal.       Results for orders placed or performed in visit on 11/05/24  POC SOFIA 2 FLU + SARS ANTIGEN FIA  Result Value Ref Range   Influenza A, POC Negative Negative   Influenza B, POC Negative Negative   SARS Coronavirus 2 Ag Negative Negative  POCT rapid strep A  Result Value Ref Range   Rapid Strep A Screen Negative Negative       Assessment & Plan:   Problem List Items Addressed This Visit       Respiratory   Severe persistent allergic asthma (HCC)   Relevant Medications   predniSONE  (DELTASONE ) 20 MG tablet   albuterol  (VENTOLIN  HFA) 108 (90 Base) MCG/ACT inhaler   ipratropium-albuterol  (DUONEB) 0.5-2.5 (3) MG/3ML SOLN  Other Visit Diagnoses       Flu-like symptoms    -  Primary   Relevant Orders   POC SOFIA 2 FLU + SARS ANTIGEN FIA (Completed)   POCT rapid strep A (Completed)     Moderate persistent asthma with exacerbation       Relevant Medications   doxycycline  (VIBRA -TABS) 100 MG tablet   predniSONE  (DELTASONE ) 20 MG tablet   albuterol  (VENTOLIN  HFA) 108 (90 Base) MCG/ACT inhaler   ipratropium-albuterol  (DUONEB) 0.5-2.5 (3) MG/3ML SOLN     Moderate persistent asthma without complication       Relevant Medications   predniSONE  (DELTASONE ) 20 MG tablet   albuterol  (VENTOLIN  HFA) 108 (90 Base) MCG/ACT inhaler   ipratropium-albuterol  (DUONEB) 0.5-2.5 (3) MG/3ML SOLN     Moderate persistent asthma with acute exacerbation       Relevant Medications   predniSONE  (DELTASONE ) 20 MG tablet   albuterol  (VENTOLIN  HFA)  108 (90 Base) MCG/ACT inhaler   ipratropium-albuterol  (DUONEB) 0.5-2.5 (3) MG/3ML SOLN       Assessment and Plan Assessment & Plan Acute cough with wheezing and mild congestion Acute cough with wheezing and mild congestion. Positive strep test without significant throat pain. Wheezing suggests possible bronchospasm. No fever. Mild sinus congestion. - Prescribed prednisone  for wheezing. - Advised nebulizer treatments three times a day or two to four puffs of albuterol  as needed. - Await strep test results to confirm diagnosis and adjust treatment if necessary.    Meds ordered this encounter  Medications   doxycycline  (VIBRA -TABS) 100 MG tablet    Sig: Take 1 tablet (100 mg total) by mouth 2 (two) times daily.    Dispense:  14 tablet    Refill:  0   predniSONE  (DELTASONE ) 20 MG tablet    Sig: Take 2 tablets (40 mg total) by mouth daily with breakfast.    Dispense:  10 tablet    Refill:  0   albuterol  (VENTOLIN  HFA) 108 (90 Base) MCG/ACT inhaler    Sig: Inhale 2 puffs into the lungs every 6 (six) hours as needed for wheezing.    Dispense:  18 g    Refill:  1    Please use generic pro-air   ipratropium-albuterol  (DUONEB) 0.5-2.5 (3) MG/3ML SOLN    Sig: Take 3 mLs by nebulization every 6 (six) hours as needed.    Dispense:  60 mL    Refill:  PRN    No follow-ups on file.  Dorothyann Byars, MD [facility] at [facility]

## 2024-11-06 ENCOUNTER — Telehealth: Payer: Self-pay

## 2024-11-06 ENCOUNTER — Encounter

## 2024-11-06 NOTE — Telephone Encounter (Signed)
 [facility] sent a form stating patient is eligible for enrollment. [name]  paperwork mailed to pt to fill and return to clinic. Fax back to;[phone] when completed

## 2024-11-09 ENCOUNTER — Encounter

## 2024-11-11 ENCOUNTER — Ambulatory Visit: Admitting: Pulmonary Disease

## 2024-11-11 ENCOUNTER — Encounter: Payer: Self-pay | Admitting: Pulmonary Disease

## 2024-11-11 VITALS — BP 148/84 | HR 83 | Temp 97.7°F | Ht 63.0 in | Wt 176.0 lb

## 2024-11-11 DIAGNOSIS — J4551 Severe persistent asthma with (acute) exacerbation: Secondary | ICD-10-CM

## 2024-11-11 DIAGNOSIS — J069 Acute upper respiratory infection, unspecified: Secondary | ICD-10-CM

## 2024-11-11 MED ORDER — PREDNISONE 10 MG PO TABS: 60.0000 mg | ORAL_TABLET | Freq: Every day | ORAL | 0 refills | Status: DC

## 2024-11-11 MED ORDER — IPRATROPIUM BROMIDE 0.03 % NA SOLN: 2.0000 | Freq: Two times a day (BID) | NASAL | 0 refills | Status: AC

## 2024-11-11 MED ORDER — MONTELUKAST SODIUM 10 MG PO TABS: 10.0000 mg | ORAL_TABLET | Freq: Every day | ORAL | 3 refills | Status: AC

## 2024-11-11 NOTE — Progress Notes (Signed)
 Tara Campbell    [id number]    08-05-1946  Primary Care Physician:Metheney, Dorothyann BIRCH, MD  Referring Physician: Alvan Dorothyann BIRCH, MD [address],  [place] [address]  Chief complaint:   Follow up for severe persistent asthma Started [name]  in March 2022  HPI: 78 y.o. with severe persistent asthma, nasal polyposis, allergies Continues to have significant symptoms with recurrent exacerbations.  She is needed to go on prednisone  4 times the past year. Has chronic dyspnea on exertion, wheeze Started [name]  in March 2022 with good control of symptoms Underwent right heart cath by Dr. Wonda on October 2023 with findings of patent coronary vessels with recommendation to continue medical therapy.   Previously on Breo which was switched to Breztri   She underwent a lumpectomy on December 31, 2023, followed by radiation therapy. She is currently on anastrozole  (Arimidex ) for five years as part of her breast cancer treatment regimen. She reports doing well post-surgery and radiation  Interim history: Discussed the use of AI scribe software for clinical note transcription with the patient, who gave verbal consent to proceed.  History of Present Illness  Wheezing and respiratory symptoms - Persistent wheezing since an upper respiratory infection during the week of Thanksgiving - Wheezing has not improved despite completing a 4-day course of prednisone  40 mg daily - No fever, chills, or mucus production - Runny and stuffy nose present - Recent strep A, influenza, and COVID-19 tests negative  Asthma history and management - Severe allergic asthma managed with [name] ; next dose due tomorrow - Uses Breztri  and albuterol  for asthma control - Similar episode occurred in August while visiting her daughter in [place], requiring an emergency room visit  Recent infection and treatment - Upper respiratory infection treated with prednisone  40 mg daily  for four days and doxycycline    Relevant pulmonary history Pets: No pets Occupation: Used to work in production manager Exposures: No mold, hot tub, Financial Controller.  No feather pillows or comforters Smoking history: Never smoker Travel history: Originally from [place] .  Moved to [facility] in 2017 Relevant family history: No significant family history of lung disease  Outpatient Encounter Medications as of 11/11/2024  Medication Sig   albuterol  (VENTOLIN  HFA) 108 (90 Base) MCG/ACT inhaler Inhale 2 puffs into the lungs every 6 (six) hours as needed for wheezing.   allopurinol  (ZYLOPRIM ) 100 MG tablet Take 1 tablet (100 mg total) by mouth daily.   anastrozole  (ARIMIDEX ) 1 MG tablet Take 1 tablet (1 mg total) by mouth daily.   atenolol  (TENORMIN ) 100 MG tablet Take 1 tablet (100 mg total) by mouth daily.   atorvastatin  (LIPITOR ) 80 MG tablet Take 1 tablet (80 mg total) by mouth at bedtime.   BREZTRI  AEROSPHERE 160-9-4.8 MCG/ACT AERO inhaler Inhale 2 puffs into the lungs 2 (two) times daily.   cyanocobalamin  (VITAMIN B12) 1000 MCG tablet Take 1,000 mcg by mouth daily.   donepezil  (ARICEPT  ODT) 5 MG disintegrating tablet Take 1 tablet (5 mg total) by mouth at bedtime.   ELIQUIS  5 MG TABS tablet Take 1 tablet (5 mg total) by mouth 2 (two) times daily.   empagliflozin  (JARDIANCE ) 10 MG TABS tablet Take 1 tablet (10 mg total) by mouth daily before breakfast.   EPINEPHRINE  0.3 mg/0.3 mL IJ SOAJ injection Inject 0.3 mg into the muscle as needed for anaphylaxis. Call 9-1-1 after use.   escitalopram  (LEXAPRO ) 5 MG tablet Take 1 tablet (5 mg total) by mouth daily.  Evolocumab  (REPATHA  SURECLICK) 140 MG/ML SOAJ Inject 140 mg into the skin every 14 (fourteen) days.   ezetimibe  (ZETIA ) 10 MG tablet Take 1 tablet (10 mg total) by mouth daily.   glucose blood test strip To be used twice daily for testing blood sugars. E11.65   ipratropium (ATROVENT ) 0.03 % nasal spray Place 2 sprays into both nostrils every 12  (twelve) hours.   ipratropium-albuterol  (DUONEB) 0.5-2.5 (3) MG/3ML SOLN Take 3 mLs by nebulization every 6 (six) hours as needed.   irbesartan -hydrochlorothiazide  (AVALIDE) 300-12.5 MG tablet Take 1 tablet by mouth daily.   isosorbide  mononitrate (IMDUR ) 30 MG 24 hr tablet TAKE TWO TABLETS (60 MG) by MOUTH in THE morning AND ONE tablet (30 MG) in THE evening   levothyroxine  (SYNTHROID ) 125 MCG tablet Take 1 tablet (125 mcg total) by mouth daily before breakfast.   Mepolizumab  ([name] ) 100 MG/ML SOAJ Inject 1 mL (100 mg total) into the skin every 28 (twenty-eight) days.   nitroGLYCERIN  (NITROSTAT ) 0.4 MG SL tablet Place 1 tablet (0.4 mg total) under the tongue every 5 (five) minutes as needed for chest pain (or tightness).   SPIKEVAX syringe USE AS DIRECTED   tirzepatide  (MOUNJARO ) 7.5 MG/0.5ML Pen Inject 7.5 mg into the skin once a week.   Vitamin D , Cholecalciferol , 25 MCG (1000 UT) TABS Take 25 mcg by mouth daily in the afternoon.   acetic acid  2 % otic solution Place 6 drops into both ears 4 (four) times daily. PRN (Patient not taking: Reported on 11/11/2024)   doxycycline  (VIBRA -TABS) 100 MG tablet Take 1 tablet (100 mg total) by mouth 2 (two) times daily. (Patient not taking: Reported on 11/11/2024)   Omega-3 Fatty Acids (FISH OIL ) 1000 MG CAPS Take 1 capsule (1,000 mg total) by mouth daily. (Patient not taking: Reported on 11/11/2024)   predniSONE  (DELTASONE ) 20 MG tablet Take 2 tablets (40 mg total) by mouth daily with breakfast. (Patient not taking: Reported on 11/11/2024)   ranolazine  (RANEXA ) 500 MG 12 hr tablet Take 1 tablet (500 mg total) by mouth 2 (two) times daily.   No facility-administered encounter medications on file as of 11/11/2024.   Vitals:   11/11/24 1051  BP: (!) 148/84  Pulse: 83  Temp: 97.7 F (36.5 C)  Height: 5' 3 (1.6 m)  Weight: 176 lb (79.8 kg)  SpO2: 93%  TempSrc: Oral  BMI (Calculated): 31.18     Physical Exam GEN: No acute distress. CV: Regular  rate and rhythm, no murmurs. LUNGS: Clear to auscultation bilaterally, normal respiratory effort. SKIN JOINTS: Warm and dry, no rash.    Data Reviewed: Imaging: Chest x-ray 08/16/2020-no acute cardiopulmonary disease.  Chest x-ray 04/25/2021-no active cardiopulmonary disease Chest x-ray 12/26/2021-no acute cardiopulmonary disease Chest x-ray 01/21/2023-no acute cardiopulmonary disease. I have reviewed the images personally  PFTs: 11/17/2020 FVC 1.74 [62%], FEV1 1.17 (56%], F/F 67 No significant obstruction, restriction suggested by reduced lung volumes.  No bronchodilator response  ACT score  02/21/2021-18 10/31/2021-24 05/23/2022- 24  Labs: CBC 08/05/2018-WBC 7.7, eos 10.5%, absolute eosinophil count 809 CBC 02/21/2021-WBC 8.4, eos 12.4%, absolute eosinophil count 1042  IgE 12/26/2017-712 IgE 02/21/2021-1191  Alpha-1 antitrypsin 11/17/2020-160  Assessment & Plan Severe persistent asthma with acute exacerbation Severe persistent asthma with acute exacerbation, likely triggered by recent upper respiratory infection. Current treatment includes [name] , Breztri , and albuterol . Previous prednisone  course was insufficient. No mucus production, fever, or chills. Negative for strep, COVID, and flu. Consideration of adding Singulair  due to persistent symptoms despite current regimen. - Initiated longer  prednisone  course starting at 60 mg, tapering by 10 mg every two days for approximately 12 days. - Continue [name] , Breztri , and albuterol . - Started Singulair .  Acute upper respiratory infection Recent upper respiratory infection contracted from nephew. Symptoms include runny and stuffy nose, but no fever or chills. Negative for strep, COVID, and flu. Previous treatment with doxycycline  and prednisone  was not fully effective. - No additional antibiotics needed as there is no mucus production or signs of bacterial infection.    Plan/Recommendations: Continue [name] , Breztri  Prednisone  taper  starting at 60 mg.  Start Singulair   Darleene Cumpian MD [facility] 11/11/2024, 10:59 AM  CC: Tara Campbell, *

## 2024-11-11 NOTE — Patient Instructions (Signed)
°  VISIT SUMMARY: You visited today due to persistent wheezing following an upper respiratory infection. Your severe asthma, managed with [name] , Breztri , and albuterol , has worsened despite a recent prednisone  course. We have adjusted your treatment plan to address this.  YOUR PLAN: SEVERE PERSISTENT ASTHMA WITH ACUTE EXACERBATION: Your severe asthma has worsened, likely due to a recent upper respiratory infection. Your current medications include [name] , Breztri , and albuterol . -Start a longer prednisone  course beginning at 60 mg, tapering by 10 mg every two days for approximately 12 days. -Continue using [name] , Breztri , and albuterol  as prescribed. -Start taking Singulair  as an additional medication to help manage your symptoms.  ACUTE UPPER RESPIRATORY INFECTION: You recently had an upper respiratory infection with symptoms like a runny and stuffy nose, but no fever or chills. Tests for strep, COVID, and flu were negative. -No additional antibiotics are needed since there is no mucus production or signs of a bacterial infection.

## 2024-11-17 ENCOUNTER — Telehealth: Payer: Self-pay

## 2024-11-17 NOTE — Telephone Encounter (Signed)
 Received [name]  PAP renewal application. Completed form and faxed with insurance cards to [name] . Will update when we receive a response.  Phone #: [phone] Fax #: [phone]

## 2024-11-19 ENCOUNTER — Other Ambulatory Visit: Payer: Self-pay | Admitting: Family Medicine

## 2024-11-19 DIAGNOSIS — E039 Hypothyroidism, unspecified: Secondary | ICD-10-CM

## 2024-11-19 NOTE — Telephone Encounter (Signed)
 Received notification from [name]  that patient was enrolled into program for new year.    Phone #: [phone] Fax #: [phone]

## 2024-11-25 ENCOUNTER — Other Ambulatory Visit (HOSPITAL_COMMUNITY): Payer: Self-pay | Admitting: Cardiology

## 2024-12-07 ENCOUNTER — Telehealth: Payer: Self-pay

## 2024-12-07 NOTE — Telephone Encounter (Signed)
 Copied from CRM [id number]. Topic: General - Call Back - No Documentation >> Dec 07, 2024  2:52 PM Olam RAMAN wrote: Reason for CRM: Pt was calling back for [facility]. Missed call for a referral.  Cb: [phone]

## 2024-12-07 NOTE — Telephone Encounter (Signed)
 Copied from CRM [id number]. Topic: Referral - Question >> Dec 07, 2024  1:45 PM Sophia H wrote: Reason for CRM: Patient has been a patient of Dr. Donnice Bury since last year - states she is needing an updated referral per insurance for general surgery. Appt scheduled for 02/20.

## 2024-12-07 NOTE — Telephone Encounter (Signed)
 Left message for a return call. The original message states she needs a referral to Dr Donnice Bury.

## 2024-12-07 NOTE — Telephone Encounter (Signed)
 I advised patient that Dr Gara office will need to do the prior authorization.

## 2024-12-07 NOTE — Telephone Encounter (Signed)
 Copied from CRM [id number]. Topic: Clinical - Medical Advice >> Dec 07, 2024  2:06 PM Shasta L wrote: Reason for CRM: Patient just wants Bonny Jon DEL, CMA to know she is not having surgery and that all she needs was a referral for the special mammogram she had, but its from another provider not us .

## 2024-12-07 NOTE — Telephone Encounter (Signed)
 Left message for a return call

## 2024-12-07 NOTE — Telephone Encounter (Signed)
 Copied from CRM [id number]. Topic: Clinical - Medication Prior Auth >> Dec 07, 2024  1:43 PM Sophia H wrote: Reason for CRM:   Patient states her insurance reached out to her and let her know that PA is required for her mammogram on 01/07. Please send over asap.

## 2024-12-07 NOTE — Telephone Encounter (Signed)
 Left message for patient to call back. Why does she see general surgery? Pended referral.

## 2024-12-08 ENCOUNTER — Telehealth: Payer: Self-pay

## 2024-12-08 DIAGNOSIS — C50411 Malignant neoplasm of upper-outer quadrant of right female breast: Secondary | ICD-10-CM

## 2024-12-08 NOTE — Telephone Encounter (Signed)
 Pt called w/ update that her insurance is not in network with [facility] for her schedule mammogram. RN advised pt to reschedule appt for facility that is in network, advised that she contact [facility]. Pt verbalized understanding. RN updated the order for mammogram.

## 2024-12-09 ENCOUNTER — Encounter

## 2024-12-29 ENCOUNTER — Encounter: Payer: Self-pay | Admitting: Family Medicine

## 2024-12-29 ENCOUNTER — Encounter

## 2024-12-29 MED ORDER — PREDNISONE 20 MG PO TABS: ORAL_TABLET | ORAL | 0 refills | Status: AC

## 2024-12-29 NOTE — Telephone Encounter (Signed)
 Colchcine cotraindicated with Ranexa . So will send in prednisone .    Meds ordered this encounter  Medications   predniSONE  (DELTASONE ) 20 MG tablet    Sig: Take 2 tablets (40 mg total) by mouth daily with breakfast for 4 days, THEN 1 tablet (20 mg total) daily with breakfast for 4 days, THEN 0.5 tablets (10 mg total) daily with breakfast for 2 days.    Dispense:  13 tablet    Refill:  0    Pls call pt, would like delivered

## 2024-12-29 NOTE — Telephone Encounter (Signed)
 Called pt and LVM advising her of Dr. Vernida recommendations and informed her that I would also send this in a my chart message as well.

## 2024-12-29 NOTE — Telephone Encounter (Signed)
 HOld the Avalide for a couple of days until BP comes up and then can restart at half a tab.   INcrease fluid and salt intake today.

## 2025-01-14 ENCOUNTER — Ambulatory Visit: Admitting: Family Medicine

## 2025-02-16 ENCOUNTER — Ambulatory Visit: Admitting: Pulmonary Disease

## 2025-06-03 ENCOUNTER — Ambulatory Visit: Admitting: Hematology and Oncology
